# Patient Record
Sex: Female | Born: 1945 | Race: White | Hispanic: No | Marital: Married | Smoking: Never smoker
Health system: Southern US, Community
[De-identification: ages and names within clinical notes are randomized; demographics above are authoritative.]

## PROBLEM LIST (undated history)

## (undated) DIAGNOSIS — M1990 Unspecified osteoarthritis, unspecified site: Secondary | ICD-10-CM

## (undated) DIAGNOSIS — I499 Cardiac arrhythmia, unspecified: Secondary | ICD-10-CM

## (undated) DIAGNOSIS — R112 Nausea with vomiting, unspecified: Secondary | ICD-10-CM

## (undated) DIAGNOSIS — I471 Supraventricular tachycardia, unspecified: Secondary | ICD-10-CM

## (undated) DIAGNOSIS — M170 Bilateral primary osteoarthritis of knee: Secondary | ICD-10-CM

## (undated) DIAGNOSIS — N939 Abnormal uterine and vaginal bleeding, unspecified: Secondary | ICD-10-CM

## (undated) DIAGNOSIS — K900 Celiac disease: Secondary | ICD-10-CM

## (undated) DIAGNOSIS — T7840XA Allergy, unspecified, initial encounter: Secondary | ICD-10-CM

## (undated) DIAGNOSIS — I4891 Unspecified atrial fibrillation: Secondary | ICD-10-CM

## (undated) DIAGNOSIS — K52839 Microscopic colitis, unspecified: Secondary | ICD-10-CM

## (undated) DIAGNOSIS — H409 Unspecified glaucoma: Secondary | ICD-10-CM

## (undated) DIAGNOSIS — IMO0002 Reserved for concepts with insufficient information to code with codable children: Secondary | ICD-10-CM

## (undated) DIAGNOSIS — Z9889 Other specified postprocedural states: Secondary | ICD-10-CM

## (undated) DIAGNOSIS — G610 Guillain-Barre syndrome: Secondary | ICD-10-CM

## (undated) HISTORY — DX: Guillain-Barre syndrome: G61.0

## (undated) HISTORY — PX: APPENDECTOMY: SHX54

## (undated) HISTORY — DX: Celiac disease: K90.0

## (undated) HISTORY — DX: Microscopic colitis, unspecified: K52.839

## (undated) HISTORY — DX: Abnormal uterine and vaginal bleeding, unspecified: N93.9

## (undated) HISTORY — DX: Supraventricular tachycardia: I47.1

## (undated) HISTORY — DX: Unspecified osteoarthritis, unspecified site: M19.90

## (undated) HISTORY — DX: Unspecified glaucoma: H40.9

## (undated) HISTORY — PX: COLONOSCOPY: SHX174

## (undated) HISTORY — DX: Reserved for concepts with insufficient information to code with codable children: IMO0002

## (undated) HISTORY — DX: Unspecified atrial fibrillation: I48.91

## (undated) HISTORY — DX: Supraventricular tachycardia, unspecified: I47.10

## (undated) HISTORY — DX: Allergy, unspecified, initial encounter: T78.40XA

## (undated) HISTORY — DX: Bilateral primary osteoarthritis of knee: M17.0

## (undated) HISTORY — PX: TONSILLECTOMY AND ADENOIDECTOMY: SUR1326

---

## 1979-06-29 HISTORY — PX: TUBAL LIGATION: SHX77

## 1999-03-25 ENCOUNTER — Other Ambulatory Visit: Admission: RE | Admit: 1999-03-25 | Discharge: 1999-03-25 | Payer: Self-pay | Admitting: Obstetrics and Gynecology

## 1999-07-07 ENCOUNTER — Other Ambulatory Visit: Admission: RE | Admit: 1999-07-07 | Discharge: 1999-07-07 | Payer: Self-pay | Admitting: Obstetrics and Gynecology

## 2001-02-20 ENCOUNTER — Other Ambulatory Visit: Admission: RE | Admit: 2001-02-20 | Discharge: 2001-02-20 | Payer: Self-pay | Admitting: Obstetrics and Gynecology

## 2001-04-25 ENCOUNTER — Encounter: Admission: RE | Admit: 2001-04-25 | Discharge: 2001-04-25 | Payer: Self-pay | Admitting: Internal Medicine

## 2001-04-25 ENCOUNTER — Encounter: Payer: Self-pay | Admitting: Obstetrics and Gynecology

## 2002-01-30 ENCOUNTER — Other Ambulatory Visit: Admission: RE | Admit: 2002-01-30 | Discharge: 2002-01-30 | Payer: Self-pay | Admitting: Obstetrics and Gynecology

## 2003-02-14 ENCOUNTER — Other Ambulatory Visit: Admission: RE | Admit: 2003-02-14 | Discharge: 2003-02-14 | Payer: Self-pay | Admitting: Obstetrics and Gynecology

## 2003-11-21 ENCOUNTER — Observation Stay (HOSPITAL_COMMUNITY): Admission: EM | Admit: 2003-11-21 | Discharge: 2003-11-22 | Payer: Self-pay | Admitting: Emergency Medicine

## 2004-02-25 ENCOUNTER — Other Ambulatory Visit: Admission: RE | Admit: 2004-02-25 | Discharge: 2004-02-25 | Payer: Self-pay | Admitting: Obstetrics and Gynecology

## 2005-02-03 ENCOUNTER — Other Ambulatory Visit: Admission: RE | Admit: 2005-02-03 | Discharge: 2005-02-03 | Payer: Self-pay | Admitting: Obstetrics and Gynecology

## 2005-04-20 ENCOUNTER — Ambulatory Visit (HOSPITAL_COMMUNITY): Admission: RE | Admit: 2005-04-20 | Discharge: 2005-04-21 | Payer: Self-pay | Admitting: Obstetrics and Gynecology

## 2005-04-20 HISTORY — PX: VAGINAL HYSTERECTOMY: SUR661

## 2005-09-14 ENCOUNTER — Emergency Department (HOSPITAL_COMMUNITY): Admission: EM | Admit: 2005-09-14 | Discharge: 2005-09-14 | Payer: Self-pay | Admitting: Emergency Medicine

## 2006-02-26 ENCOUNTER — Encounter: Admission: RE | Admit: 2006-02-26 | Discharge: 2006-02-26 | Payer: Self-pay

## 2013-05-09 ENCOUNTER — Encounter: Payer: Self-pay | Admitting: Internal Medicine

## 2013-08-02 ENCOUNTER — Encounter: Payer: Self-pay | Admitting: Certified Nurse Midwife

## 2013-08-06 ENCOUNTER — Ambulatory Visit (INDEPENDENT_AMBULATORY_CARE_PROVIDER_SITE_OTHER): Payer: Medicare Other | Admitting: Certified Nurse Midwife

## 2013-08-06 ENCOUNTER — Encounter: Payer: Self-pay | Admitting: Certified Nurse Midwife

## 2013-08-06 VITALS — BP 98/62 | HR 64 | Resp 16 | Ht 64.5 in | Wt 145.0 lb

## 2013-08-06 DIAGNOSIS — Z01419 Encounter for gynecological examination (general) (routine) without abnormal findings: Secondary | ICD-10-CM

## 2013-08-06 DIAGNOSIS — N951 Menopausal and female climacteric states: Secondary | ICD-10-CM

## 2013-08-06 MED ORDER — ESTRADIOL 0.025 MG/24HR TD PTWK: 0.0250 mg | MEDICATED_PATCH | TRANSDERMAL | Status: DC

## 2013-08-06 NOTE — Patient Instructions (Signed)
EXERCISE AND DIET:  We recommended that you start or continue a regular exercise program for good health. Regular exercise means any activity that makes your heart beat faster and makes you sweat.  We recommend exercising at least 30 minutes per day at least 3 days a week, preferably 4 or 5.  We also recommend a diet low in fat and sugar.  Inactivity, poor dietary choices and obesity can cause diabetes, heart attack, stroke, and kidney damage, among others.    ALCOHOL AND SMOKING:  Women should limit their alcohol intake to no more than 7 drinks/beers/glasses of wine (combined, not each!) per week. Moderation of alcohol intake to this level decreases your risk of breast cancer and liver damage. And of course, no recreational drugs are part of a healthy lifestyle.  And absolutely no smoking or even second hand smoke. Most people know smoking can cause heart and lung diseases, but did you know it also contributes to weakening of your bones? Aging of your skin?  Yellowing of your teeth and nails?  CALCIUM AND VITAMIN D:  Adequate intake of calcium and Vitamin D are recommended.  The recommendations for exact amounts of these supplements seem to change often, but generally speaking 600 mg of calcium (either carbonate or citrate) and 800 units of Vitamin D per day seems prudent. Certain women may benefit from higher intake of Vitamin D.  If you are among these women, your doctor will have told you during your visit.    PAP SMEARS:  Pap smears, to check for cervical cancer or precancers,  have traditionally been done yearly, although recent scientific advances have shown that most women can have pap smears less often.  However, every woman still should have a physical exam from her gynecologist every year. It will include a breast check, inspection of the vulva and vagina to check for abnormal growths or skin changes, a visual exam of the cervix, and then an exam to evaluate the size and shape of the uterus and  ovaries.  And after 69 years of age, a rectal exam is indicated to check for rectal cancers. We will also provide age appropriate advice regarding health maintenance, like when you should have certain vaccines, screening for sexually transmitted diseases, bone density testing, colonoscopy, mammograms, etc.   MAMMOGRAMS:  All women over 40 years old should have a yearly mammogram. Many facilities now offer a "3D" mammogram, which may cost around $50 extra out of pocket. If possible,  we recommend you accept the option to have the 3D mammogram performed.  It both reduces the number of women who will be called back for extra views which then turn out to be normal, and it is better than the routine mammogram at detecting truly abnormal areas.    COLONOSCOPY:  Colonoscopy to screen for colon cancer is recommended for all women at age 50.  We know, you hate the idea of the prep.  We agree, BUT, having colon cancer and not knowing it is worse!!  Colon cancer so often starts as a polyp that can be seen and removed at colonscopy, which can quite literally save your life!  And if your first colonoscopy is normal and you have no family history of colon cancer, most women don't have to have it again for 10 years.  Once every ten years, you can do something that may end up saving your life, right?  We will be happy to help you get it scheduled when you are ready.    Be sure to check your insurance coverage so you understand how much it will cost.  It may be covered as a preventative service at no cost, but you should check your particular policy.     Hormone Therapy At menopause, your body begins making less estrogen and progesterone hormones. This causes the body to stop having menstrual periods. This is because estrogen and progesterone hormones control your periods and menstrual cycle. A lack of estrogen may cause symptoms such as:  Hot flushes (or hot flashes).  Vaginal dryness.  Dry skin.  Loss of sex  drive.  Risk of bone loss (osteoporosis). When this happens, you may choose to take hormone therapy to get back the estrogen lost during menopause. When the hormone estrogen is given alone, it is usually referred to as ET (Estrogen Therapy). When the hormone progestin is combined with estrogen, it is generally called HT (Hormone Therapy). This was formerly known as hormone replacement therapy (HRT). Your caregiver can help you make a decision on what will be best for you. The decision to use HT seems to change often as new studies are done. Many studies do not agree on the benefits of hormone replacement therapy. LIKELY BENEFITS OF HT INCLUDE PROTECTION FROM:  Hot Flushes (also called hot flashes) - A hot flush is a sudden feeling of heat that spreads over the face and body. The skin may redden like a blush. It is connected with sweats and sleep disturbance. Women going through menopause may have hot flushes a few times a month or several times per day depending on the woman.  Osteoporosis (bone loss)- Estrogen helps guard against bone loss. After menopause, a woman's bones slowly lose calcium and become weak and brittle. As a result, bones are more likely to break. The hip, wrist, and spine are affected most often. Hormone therapy can help slow bone loss after menopause. Weight bearing exercise and taking calcium with vitamin D also can help prevent bone loss. There are also medications that your caregiver can prescribe that can help prevent osteoporosis.  Vaginal Dryness - Loss of estrogen causes changes in the vagina. Its lining may become thin and dry. These changes can cause pain and bleeding during sexual intercourse. Dryness can also lead to infections. This can cause burning and itching. (Vaginal estrogen treatment can help relieve pain, itching, and dryness.)  Urinary Tract Infections are more common after menopause because of lack of estrogen. Some women also develop urinary incontinence  because of low estrogen levels in the vagina and bladder.  Possible other benefits of estrogen include a positive effect on mood and short-term memory in women. RISKS AND COMPLICATIONS  Using estrogen alone without progesterone causes the lining of the uterus to grow. This increases the risk of lining of the uterus (endometrial) cancer. Your caregiver should give another hormone called progestin if you have a uterus.  Women who take combined (estrogen and progestin) HT appear to have an increased risk of breast cancer. The risk appears to be small, but increases throughout the time that HT is taken.  Combined therapy also makes the breast tissue slightly denser which makes it harder to read mammograms (breast X-rays).  Combined, estrogen and progesterone therapy can be taken together every day, in which case there may be spotting of blood. HT therapy can be taken cyclically in which case you will have menstrual periods. Cyclically means HT is taken for a set amount of days, then not taken, then this process is repeated.  HT  increase the risk of stroke, heart attack, breast cancer and forming blood clots in your leg.  Transdermal estrogen (estrogen that is absorbed through the skin with a patch or a cream) may have more positive results with:  Cholesterol.  Blood pressure.  Blood clots. Having the following conditions may indicate you should not have HT:  Endometrial cancer.  Liver disease.  Breast cancer.  Heart disease.  History of blood clots.  Stroke. TREATMENT   If you choose to take HT and have a uterus, usually estrogen and progestin are prescribed.  Your caregiver will help you decide the best way to take the medications.  Possible ways to take estrogen include:  Pills.  Patches.  Gels.  Sprays.  Vaginal estrogen cream, rings and tablets.  It is best to take the lowest dose possible that will help your symptoms and take them for the shortest period of  time that you can.  Hormone therapy can help relieve some of the problems (symptoms) that affect women at menopause. Before making a decision about HT, talk to your caregiver about what is best for you. Be well informed and comfortable with your decisions. HOME CARE INSTRUCTIONS   Follow your caregivers advice when taking the medications.  A Pap test is done to screen for cervical cancer.  The first Pap test should be done at age 21.  Between ages 21 and 29, Pap tests are repeated every 2 years.  Beginning at age 30, you are advised to have a Pap test every 3 years as long as your past 3 Pap tests have been normal.  Some women have medical problems that increase the chance of getting cervical cancer. Talk to your caregiver about these problems. It is especially important to talk to your caregiver if a new problem develops soon after your last Pap test. In these cases, your caregiver may recommend more frequent screening and Pap tests.  The above recommendations are the same for women who have or have not gotten the vaccine for HPV (Human Papillomavirus).  If you had a hysterectomy for a problem that was not a cancer or a condition that could lead to cancer, then you no longer need Pap tests. However, even if you no longer need a Pap test, a regular exam is a good idea to make sure no other problems are starting.   If you are between ages 65 and 70, and you have had normal Pap tests going back 10 years, you no longer need Pap tests. However, even if you no longer need a Pap test, a regular exam is a good idea to make sure no other problems are starting.   If you have had past treatment for cervical cancer or a condition that could lead to cancer, you need Pap tests and screening for cancer for at least 20 years after your treatment.  If Pap tests have been discontinued, risk factors (such as a new sexual partner) need to be re-assessed to determine if screening should be  resumed.  Some women may need screenings more often if they are at high risk for cervical cancer.  Get mammograms done as per the advice of your caregiver. SEEK IMMEDIATE MEDICAL CARE IF:  You develop abnormal vaginal bleeding.  You have pain or swelling in your legs, shortness of breath, or chest pain.  You develop dizziness or headaches.  You have lumps or changes in your breasts or armpits.  You have slurred speech.  You develop weakness or numbness of   of your arms or legs.  You have pain, burning, or bleeding when urinating.  You develop abdominal pain. Document Released: 03/13/2003 Document Revised: 09/06/2011 Document Reviewed: 07/01/2010 [facility] Patient Information 2014 [place], [place].

## 2013-08-06 NOTE — Progress Notes (Signed)
68 y.o. [id number] Married Caucasian Fe here for annual exam.  Menopausal ERT working well. No vaginal bleeding or vaginal dryness. Sees PCP yearly for aex and labs and medication management, no changes in dose. Arthritis continues to be tolerable with medication. Had elbow fracture right in 2014. No change in BMD per patient. PCP manages. No other health issues today.  Patient's last menstrual period was 04/20/2005.          Sexually active: yes  The current method of family planning is status post hysterectomy.    Exercising: yes  walking Smoker:  no  Health Maintenance: Pap:  02-03-05 neg MMG:  08-03-13 neg Colonoscopy: 2008 10 years BMD:   2014 normal per patient TDaP:  2012 Labs: none Self breast exam: done monthly     Past Medical History  Diagnosis Date  . Abnormal uterine bleeding     on HRT  . SVT (supraventricular tachycardia)   . Arthritis   . DDD (degenerative disc disease)     Past Surgical History  Procedure Laterality Date  . Tonsillectomy and adenoidectomy    . Tubal ligation  1981  . Vaginal hysterectomy  04/20/05    prolapse, adenomyosis    Current Outpatient Prescriptions  Medication Sig Dispense Refill  . aspirin 81 MG tablet Take 81 mg by mouth daily.      . Calcium Carbonate Antacid (TUMS PO) Take by mouth as needed.      Marland Kitchen estradiol (CLIMARA - DOSED IN MG/24 HR) 0.025 mg/24hr patch Place 0.025 mg onto the skin once a week.      Marland Kitchen glucosamine-chondroitin 500-400 MG tablet Take 1 tablet by mouth 3 (three) times daily.      . IBUPROFEN PO Take by mouth daily.      . naproxen sodium (ANAPROX) 220 MG tablet Take 220 mg by mouth as needed.      . Omega-3 Fatty Acids (FISH OIL PO) Take by mouth as needed.      . psyllium (METAMUCIL) 58.6 % packet Take 1 packet by mouth daily.       No current facility-administered medications for this visit.    Family History  Problem Relation Age of Onset  . Osteoporosis Mother   . Cancer Father     father  . Heart  disease Brother     ROS:  Pertinent items are noted in HPI.  Otherwise, a comprehensive ROS was negative.  Exam:   Ht 5' 4.5" (1.638 m)  Wt 145 lb (65.772 kg)  BMI 24.51 kg/m2  LMP 04/20/2005 Height: 5' 4.5" (163.8 cm)  Ht Readings from Last 3 Encounters:  08/06/13 5' 4.5" (1.638 m)    General appearance: alert, cooperative and appears stated age Head: Normocephalic, without obvious abnormality, atraumatic Neck: no adenopathy, supple, symmetrical, trachea midline and thyroid normal to inspection and palpation Lungs: clear to auscultation bilaterally Breasts: normal appearance, no masses or tenderness, No nipple retraction or dimpling, No nipple discharge or bleeding, No axillary or supraclavicular adenopathy Heart: regular rate and rhythm Abdomen: soft, non-tender; no masses,  no organomegaly Extremities: extremities normal, atraumatic, no cyanosis or edema Skin: Skin color, texture, turgor normal. No rashes or lesions Lymph nodes: Cervical, supraclavicular, and axillary nodes normal. No abnormal inguinal nodes palpated Neurologic: Grossly normal   Pelvic: External genitalia:  no lesions              Urethra:  normal appearing urethra with no masses, tenderness or lesions  Bartholin's and Skene's: normal                 Vagina: normal appearing vagina with normal color and discharge, no lesions              Cervix: not present              Pap taken: no Bimanual Exam:  Uterus:  uterus absent              Adnexa: normal adnexa and no mass, fullness, tenderness               Rectovaginal: Confirms               Anus:  normal sphincter tone, no lesions  A:  Well Woman with normal exam  Menopausal on ERT desires continuance s/p TVH due to prolapse, ovaries retained  Rheumatoid Arthritis  Recent elbow fracture right  Hypertension stable on medication with PCP management  P:   Reviewed health and wellness pertinent to exam  Discussed risks/benefits of long term  estrogen use, WHI information. Discussed recent fracture and history of severe osteoporosis in MGM, and patient's history of arthritis. Patient and provider agree together that her benefits out weigh risks. She is well controlled with hypertension and has regular aex with PCP and see MD. Regarding arthritis. Will continue Climara and re evaluate in one year. Patient to advise if any health changes occur. Rx Climara: see order  Pap smear as per guidelines   Mammogram yearly pap smear not taken today  counseled on breast self exam, mammography screening, menopause, adequate intake of calcium and vitamin D, diet and exercise, Kegel's exercises  return annually or prn  An After Visit Summary was printed and given to the patient.

## 2013-08-10 NOTE — Progress Notes (Signed)
Reviewed personally.  M. Suzanne Noga Fogg, MD.  

## 2013-10-26 HISTORY — PX: FOOT FRACTURE SURGERY: SHX645

## 2013-11-30 ENCOUNTER — Encounter (HOSPITAL_COMMUNITY): Payer: Self-pay | Admitting: Emergency Medicine

## 2013-11-30 ENCOUNTER — Emergency Department (HOSPITAL_COMMUNITY): Payer: Medicare Other

## 2013-11-30 ENCOUNTER — Inpatient Hospital Stay (HOSPITAL_COMMUNITY)
Admission: EM | Admit: 2013-11-30 | Discharge: 2013-12-02 | DRG: 310 | Disposition: A | Discharge status: Home / Self Care | Payer: Medicare Other | Attending: Internal Medicine | Admitting: Internal Medicine

## 2013-11-30 DIAGNOSIS — I480 Paroxysmal atrial fibrillation: Secondary | ICD-10-CM | POA: Diagnosis present

## 2013-11-30 DIAGNOSIS — I4891 Unspecified atrial fibrillation: Principal | ICD-10-CM

## 2013-11-30 DIAGNOSIS — I4892 Unspecified atrial flutter: Secondary | ICD-10-CM | POA: Diagnosis present

## 2013-11-30 DIAGNOSIS — I959 Hypotension, unspecified: Secondary | ICD-10-CM

## 2013-11-30 DIAGNOSIS — Z79899 Other long term (current) drug therapy: Secondary | ICD-10-CM

## 2013-11-30 DIAGNOSIS — M1990 Unspecified osteoarthritis, unspecified site: Secondary | ICD-10-CM

## 2013-11-30 LAB — CBC
HEMATOCRIT: 44.5 % (ref 36.0–46.0)
Hemoglobin: 14.9 g/dL (ref 12.0–15.0)
MCH: 31 pg (ref 26.0–34.0)
MCHC: 33.5 g/dL (ref 30.0–36.0)
MCV: 92.7 fL (ref 78.0–100.0)
PLATELETS: 321 10*3/uL (ref 150–400)
RBC: 4.8 MIL/uL (ref 3.87–5.11)
RDW: 14 % (ref 11.5–15.5)
WBC: 10 10*3/uL (ref 4.0–10.5)

## 2013-11-30 LAB — BASIC METABOLIC PANEL
BUN: 18 mg/dL (ref 6–23)
CALCIUM: 9.5 mg/dL (ref 8.4–10.5)
CO2: 26 mEq/L (ref 19–32)
CREATININE: 0.55 mg/dL (ref 0.50–1.10)
Chloride: 101 mEq/L (ref 96–112)
Glucose, Bld: 99 mg/dL (ref 70–99)
Potassium: 4.8 mEq/L (ref 3.7–5.3)
Sodium: 139 mEq/L (ref 137–147)

## 2013-11-30 LAB — I-STAT TROPONIN, ED: TROPONIN I, POC: 0 ng/mL (ref 0.00–0.08)

## 2013-11-30 MED ORDER — DILTIAZEM LOAD VIA INFUSION
10.0000 mg | Freq: Once | INTRAVENOUS | Status: AC
  Administered 2013-11-30: 10 mg via INTRAVENOUS
  Filled 2013-11-30: qty 10

## 2013-11-30 MED ORDER — DILTIAZEM HCL 100 MG IV SOLR
5.0000 mg/h | INTRAVENOUS | Status: DC
  Administered 2013-11-30: 5 mg/h via INTRAVENOUS

## 2013-11-30 MED ORDER — SODIUM CHLORIDE 0.9 % IV BOLUS (SEPSIS)
500.0000 mL | Freq: Once | INTRAVENOUS | Status: AC
  Administered 2013-11-30: 500 mL via INTRAVENOUS

## 2013-11-30 NOTE — ED Notes (Signed)
Pt reports a twinge of substernal chest pain. States that the pain comes and goes quickly.

## 2013-11-30 NOTE — H&P (Signed)
PCP:  Thressa Sheller, MD    Chief Complaint:  palpitations  HPI: Angela Morales is a 68 y.o. female   has a past medical history of Abnormal uterine bleeding; SVT (supraventricular tachycardia); Arthritis; and DDD (degenerative disc disease).   Presented with  Sensation of palpitations. She has been on crutches for the past 5 weeks after Left foot fracture. Patient have had intermittent short episodes of chest pain. Thursday night she had some palpitations. She have not been eating well and have had some nausea. Every time she tries to get up she feels light headed. She presented to ER and was found to be in tachycardiac rhythm thought to be possibly A. flutter she was given diltiazem and her HR went down from 130's to 110 repeat ECG showed A.fib with RVR. Her blood pressure went down as well to 80/40 ths has resolved once diltiazem was stopped.  Currently she is chest pain free but gets occasional twinge in the center of her chest. Not worse with deep breaths or movement.  Currently on the monitor patient back in sinus rhythmwith HR in 70-80's  Hospitalist was called for admission for new onset a.fib.  Review of Systems:    Pertinent positives include: chest pain, nausea   Constitutional:  No weight loss, night sweats, Fevers, chills, fatigue, weight loss  HEENT:  No headaches, Difficulty swallowing,Tooth/dental problems,Sore throat,  No sneezing, itching, ear ache, nasal congestion, post nasal drip,  Cardio-vascular:  No  Orthopnea, PND, anasarca, dizziness, palpitations.no Bilateral lower extremity swelling  GI:  No heartburn, indigestion, abdominal pain, vomiting, diarrhea, change in bowel habits, loss of appetite, melena, blood in stool, hematemesis Resp:  no shortness of breath at rest. No dyspnea on exertion, No excess mucus, no productive cough, No non-productive cough, No coughing up of blood.No change in color of mucus.No wheezing. Skin:  no rash or lesions. No  jaundice GU:  no dysuria, change in color of urine, no urgency or frequency. No straining to urinate.  No flank pain.  Musculoskeletal:  No joint pain or no joint swelling. No decreased range of motion. No back pain.  Psych:  No change in mood or affect. No depression or anxiety. No memory loss.  Neuro: no localizing neurological complaints, no tingling, no weakness, no double vision, no gait abnormality, no slurred speech, no confusion  Otherwise ROS are negative except for above, 10 systems were reviewed  Past Medical History: Past Medical History  Diagnosis Date  . Abnormal uterine bleeding     on HRT  . SVT (supraventricular tachycardia)   . Arthritis   . DDD (degenerative disc disease)    Past Surgical History  Procedure Laterality Date  . Tonsillectomy and adenoidectomy    . Tubal ligation  1981  . Vaginal hysterectomy  04/20/05    prolapse, adenomyosis  . Appendectomy       Medications: Prior to Admission medications   Medication Sig Start Date End Date Taking? Authorizing Provider  Cholecalciferol (VITAMIN D PO) Take 2,000 Int'l Units by mouth daily.    Yes Historical Provider, MD  estradiol (CLIMARA - DOSED IN MG/24 HR) 0.025 mg/24hr patch Place 0.025 mg onto the skin once a week. Mondays 08/06/13  Yes Regina Eck, CNM  fluticasone (FLONASE) 50 MCG/ACT nasal spray Place 1 spray into both nostrils daily as needed for allergies.  08/03/13  Yes Historical Provider, MD  Glucosamine HCl (GLUCOSAMINE PO) Take 1 tablet by mouth daily.    Yes Historical Provider, MD  LOTEMAX 0.5 % ophthalmic suspension Place 1 drop into both eyes daily.  08/03/13  Yes Historical Provider, MD  metoprolol tartrate (LOPRESSOR) 25 MG tablet Take 12.5 mg by mouth 2 (two) times daily. 1/2 bid   Yes Historical Provider, MD  naproxen sodium (ANAPROX) 220 MG tablet Take 220 mg by mouth daily as needed. For pain   Yes Historical Provider, MD  Omega-3 Fatty Acids (FISH OIL PO) Take 1 capsule by mouth  daily.    Yes Historical Provider, MD  psyllium (METAMUCIL) 58.6 % packet Take 1 packet by mouth daily.   Yes Historical Provider, MD    Allergies:   Allergies  Allergen Reactions  . Cefdinir Rash    Social History:  Ambulatory  independently   Lives at home   With family     reports that she has never smoked. She does not have any smokeless tobacco history on file. She reports that she drinks about 2.4 - 3.6 ounces of alcohol per week. She reports that she does not use illicit drugs.    Family History: family history includes Breast cancer in her cousin; Cancer in her father; Heart disease in her brother; Osteoporosis in her mother; Stroke in her mother.    Physical Exam: Patient Vitals for the past 24 hrs:  BP Temp Temp src Pulse Resp SpO2 Height Weight  11/30/13 2251 101/83 mmHg - - - 15 99 % - -  11/30/13 2217 85/70 mmHg - - - 19 98 % - -  11/30/13 2135 98/70 mmHg - - 118 14 99 % - -  11/30/13 2100 108/75 mmHg - - 130 16 100 % - -  11/30/13 2040 123/90 mmHg - - 135 16 99 % - -  11/30/13 2016 118/84 mmHg - - - 9 100 % - -  11/30/13 1955 129/75 mmHg - - 136 16 100 % - -  11/30/13 1943 - - - - - - 5' 5"  (1.651 m) 64.411 kg (142 lb)  11/30/13 1942 110/81 mmHg 98 F (36.7 C) Oral 135 17 100 % - -    1. General:  in No Acute distress 2. Psychological: Alert and   Oriented 3. Head/ENT:    Dry Mucous Membranes                          Head Non traumatic, neck supple                          Normal   Dentition 4. SKIN:   decreased Skin turgor,  Skin clean Dry and intact no rash 5. Heart: Regular rate and rhythm no Murmur, Rub or gallop 6. Lungs: Clear to auscultation bilaterally, no wheezes or crackles   7. Abdomen: Soft, non-tender, Non distended 8. Lower extremities: no clubbing, cyanosis, or edema, Left ankle appears slightly larger than right patient states that has been present  ever since her surgery and recent sprain.  9. Neurologically Grossly intact, moving all 4  extremities equally 10. MSK: Normal range of motion  body mass index is 23.63 kg/(m^2).   Labs on Admission:   Recent Labs  11/30/13 1955  NA 139  K 4.8  CL 101  CO2 26  GLUCOSE 99  BUN 18  CREATININE 0.55  CALCIUM 9.5   No results found for this basename: AST, ALT, ALKPHOS, BILITOT, PROT, ALBUMIN,  in the last 72 hours No results found for this basename: LIPASE,  AMYLASE,  in the last 72 hours  Recent Labs  11/30/13 1955  WBC 10.0  HGB 14.9  HCT 44.5  MCV 92.7  PLT 321   No results found for this basename: CKTOTAL, CKMB, CKMBINDEX, TROPONINI,  in the last 72 hours No results found for this basename: TSH, T4TOTAL, FREET3, T3FREE, THYROIDAB,  in the last 72 hours No results found for this basename: VITAMINB12, FOLATE, FERRITIN, TIBC, IRON, RETICCTPCT,  in the last 72 hours No results found for this basename: HGBA1C    Estimated Creatinine Clearance: 60.6 ml/min (by C-G formula based on Cr of 0.55). ABG No results found for this basename: phart, pco2, po2, hco3, tco2, acidbasedef, o2sat     No results found for this basename: DDIMER     Other results:  I have pearsonaly reviewed this: ECG REPORT  Rate 136  Rhythm: a. flutter ST&T Change: no ischemic changes  BNP (last 3 results) No results found for this basename: PROBNP,  in the last 8760 hours  Filed Weights   11/30/13 1943  Weight: 64.411 kg (142 lb)    Cultures: No results found for this basename: sdes, specrequest, cult, reptstatus     Radiological Exams on Admission: Dg Chest 2 View  11/30/2013   CLINICAL DATA:  Chest pain.  Palpitations.  Lightheadedness.  EXAM: CHEST  2 VIEW  COMPARISON:  Chest x-ray 04/19/2005.  FINDINGS: Lung volumes are normal. No consolidative airspace disease. No pleural effusions. No pneumothorax. No pulmonary nodule or mass noted. Pulmonary vasculature and the cardiomediastinal silhouette are within normal limits.  IMPRESSION: No radiographic evidence of acute  cardiopulmonary disease.   Electronically Signed   By: Vinnie Langton M.D.   On: 11/30/2013 20:14    Chart has been reviewed  Assessment/Plan  68 year old female with remote history of SVT pain on the Toprol presents with episode of A. fib with RVR currently converted back to sinus rhythm with transient hypotension in the setting of diltiazem drip.   Present on Admission:   . Atrial fibrillation with RVR - currently converted back to sinus. Continue metoprolol obtain echo gram and TSH. CHADS2 score 0, we'll start with aspirin 81 mg a day. . Hypotension - transient in a setting of diltiazem drip currently have stopped. Check orthostatics patient appears to be slightly hemoconcentrated. Obtain d-dimer Chest pain atypical transient but will cycle cardiac enzymes  Prophylaxis:  Lovenox CODE STATUS:  FULL CODE   Other plan as per orders.  I have spent a total of 55 min on this admission  Jakoby Melendrez 11/30/2013, 11:54 PM

## 2013-11-30 NOTE — ED Provider Notes (Signed)
CSN: [id number]     Arrival date & time 11/30/13  1925 History   First MD Initiated Contact with Patient 11/30/13 2011     Chief Complaint  Patient presents with  . 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 She was first diagnosed with SVT 8 years ago, and states that at that time her heart rate was in the 250s. It has been well-controlled since that time on her metoprolol. Last night she be in a have palpitations and noted that her heart was beating quickly. This subsided this morning, but returned again this afternoon. She's been having a few seconds of chest pain at a time for the last 4 days. The chest pain occurs about 4-5 times per hour. It is not worse with exertion. It is on the left side of her mid chest. She has also experienced some lightheadedness. She denies having any lower extremity edema. Has felt nauseated but has not vomited.  Of note she did have foot surgery 5 weeks ago after having a fractured fifth metatarsal. She's been ambulating on crutches after that surgery.  Past Medical History  Diagnosis Date  . Abnormal uterine bleeding     on HRT  . SVT (supraventricular tachycardia)   . Arthritis   . DDD (degenerative disc disease)    Past Surgical History  Procedure Laterality Date  . Tonsillectomy and adenoidectomy    . Tubal ligation  1981  . Vaginal hysterectomy  04/20/05    prolapse, adenomyosis  . Appendectomy     Family History  Problem Relation Age of Onset  . Osteoporosis Mother   . Stroke Mother     mini  . Cancer Father     prostate  . Heart disease Brother   . Breast cancer Cousin     1st cousin   History  Substance Use Topics  . Smoking status: Never Smoker   . Smokeless tobacco: Not on file  .  Alcohol Use: 2.4 - 3.6 oz/week    4-6 Glasses of wine per week   OB History   Grav Para Term Preterm Abortions TAB SAB Ect Mult Living   3 3 3       2      Review of Systems  Respiratory: Positive for shortness of breath.   Cardiovascular: Positive for chest pain and palpitations. Negative for leg swelling.  Neurological: Positive for light-headedness.  All other systems reviewed and are negative.     Allergies  Cefdinir  Home Medications   Prior to Admission medications   Medication Sig Start Date End Date Taking? Authorizing Provider  Calcium Carbonate Antacid (TUMS PO) Take by mouth as needed.    Historical Provider, MD  Cholecalciferol (VITAMIN D PO) Take 2,000 Int'l Units by mouth daily.     Historical Provider, MD  estradiol (CLIMARA - DOSED IN MG/24 HR) 0.025 mg/24hr patch Place 1 patch (0.025 mg total) onto the skin once a week. 08/06/13   Regina Eck, CNM  fluticasone (FLONASE) 50 MCG/ACT nasal spray as needed. 08/03/13   Historical Provider, MD  Glucosamine HCl (GLUCOSAMINE PO) Take by mouth daily.    Historical Provider, MD  LOTEMAX 0.5 % ophthalmic suspension daily. 08/03/13   Historical Provider, MD  metoprolol tartrate (LOPRESSOR) 25 MG tablet Take 25 mg by mouth. 1/2 bid  Historical Provider, MD  naproxen sodium (ANAPROX) 220 MG tablet Take 220 mg by mouth as needed.    Historical Provider, MD  Omega-3 Fatty Acids (FISH OIL PO) Take by mouth daily.     Historical Provider, MD  psyllium (METAMUCIL) 58.6 % packet Take 1 packet by mouth daily.    Historical Provider, MD   BP 123/90  Pulse 135  Temp(Src) 98 F (36.7 C) (Oral)  Resp 16  Ht 5' 5"  (1.651 m)  Wt 142 lb (64.411 kg)  BMI 23.63 kg/m2  SpO2 99%  LMP 04/20/2005 Physical Exam  Constitutional: She is oriented to person, place, and time. She appears well-developed and well-nourished. No distress.  HENT:  Head: Normocephalic and atraumatic.  Mouth/Throat: Oropharynx is clear and moist.  Eyes: Pupils  are equal, round, and reactive to light.  Cardiovascular:  No murmur heard. Tachycardic regular rhythm  Pulmonary/Chest: Effort normal and breath sounds normal.  Abdominal: Soft. She exhibits no distension. There is no tenderness. There is no rebound and no guarding.  Musculoskeletal: She exhibits no edema.  Neurological: She is alert and oriented to person, place, and time.  Skin: Skin is warm and dry. She is not diaphoretic.  Psychiatric: She has a normal mood and affect. Her behavior is normal.     ED Course  Procedures (including critical care time) Labs Review Labs Reviewed  CBC  BASIC METABOLIC PANEL  D-DIMER, QUANTITATIVE  Randolm Idol, ED    Imaging Review Dg Chest 2 View  11/30/2013   CLINICAL DATA:  Chest pain.  Palpitations.  Lightheadedness.  EXAM: CHEST  2 VIEW  COMPARISON:  Chest x-ray 04/19/2005.  FINDINGS: Lung volumes are normal. No consolidative airspace disease. No pleural effusions. No pneumothorax. No pulmonary nodule or mass noted. Pulmonary vasculature and the cardiomediastinal silhouette are within normal limits.  IMPRESSION: No radiographic evidence of acute cardiopulmonary disease.   Electronically Signed   By: Vinnie Langton M.D.   On: 11/30/2013 20:14     EKG Interpretation None      MDM   Final diagnoses:  Atrial fibrillation    68 year old female with a history of SVT controlled on metoprolol twice daily, who presents with palpitations and tachycardia. Currently her to telemetry monitor is showing a narrow complex regular tachycardia without visible P waves, suggestive of junctional rhythm. EKG difficult to interpret due to electrical noise.  Will start Cardizem bolus and infusion and plan to repeat EKG once her rate slowed. Will also check d-dimer to rule out PE in setting of orthopedic surgery 5 weeks ago. Her chest pain is nonspecific, and initial troponin is negative. Will repeat her troponin in 3 hours.  Update: Heart rate has slowed  to 118, but now she is hypotensive with a pressure of 85/70. Repeat EKG shows atrial fibrillation, with narrow complex tachycardia irregular rhythm. No P waves seen. Will stop Cardizem drip due to hypotension and bolus 500 cc, which will hopefully also help her heart rate.  Update: After just 250 cc of her bolus, patient's heart rate is down into the 70s, and her blood pressure has improved into the low 315Q systolic. She remains in atrial fibrillation.  Patient will require admission to the hospital for observation and continued control of her heart rate. She will likely need anticoagulation with a CHADS2Vasc score of 2 (one point each for age and gender). Will consult hospitalist for admission.  Chrisandra Netters, MD Family Medicine PGY-2   Leeanne Rio, MD 11/30/13 [phone]

## 2013-11-30 NOTE — ED Notes (Signed)
Pt is in a-fib. Pt denies hx of a-fib. This RN performed another EKG, and gave it to Fonnie Jarvis, MD.

## 2013-11-30 NOTE — ED Provider Notes (Signed)
I saw and evaluated the patient, reviewed the resident's note and I agree with the findings and plan.   Muse not working; ECG: Atrial fibrillation, ventricular rate 119, normal axis, artifact present, no definite acute ischemic changes noted, compared with previous ECG atrial fibrillation now present 2 days intermittent palpitations and sudden intermittent brief sharp nonpleuritic nonexertional CP  2 days intermittent palpitations also with brief intermittent CP spells lasting seconds each, nonexertional and nonpleuritic, had a narrow complex tachycardia regular rhythm initially in the ED with rates in the 130s and changed to an irregular rhythm atrial fibrillation during ED stay  Hurman Horn, MD 12/02/13 1423

## 2013-11-30 NOTE — ED Notes (Signed)
Patient states she has been having fluttering sensation in her chest for most of the day, with intermittent chest pain during the day.  Patient states she has been having some lightheadedness and nausea with the fluttering and pain.  She denies any shortness of breath.  Patient states she does take Metoprolol on a regular basis but has not taken any extra today.

## 2013-12-01 DIAGNOSIS — I4891 Unspecified atrial fibrillation: Secondary | ICD-10-CM | POA: Diagnosis present

## 2013-12-01 DIAGNOSIS — I059 Rheumatic mitral valve disease, unspecified: Secondary | ICD-10-CM

## 2013-12-01 LAB — URINE MICROSCOPIC-ADD ON

## 2013-12-01 LAB — COMPREHENSIVE METABOLIC PANEL
ALT: 16 U/L (ref 0–35)
AST: 22 U/L (ref 0–37)
Albumin: 3.1 g/dL — ABNORMAL LOW (ref 3.5–5.2)
Alkaline Phosphatase: 68 U/L (ref 39–117)
BILIRUBIN TOTAL: 0.3 mg/dL (ref 0.3–1.2)
BUN: 15 mg/dL (ref 6–23)
CALCIUM: 8.6 mg/dL (ref 8.4–10.5)
CHLORIDE: 105 meq/L (ref 96–112)
CO2: 25 meq/L (ref 19–32)
CREATININE: 0.49 mg/dL — AB (ref 0.50–1.10)
GFR calc Af Amer: >90 mL/min (ref >90)
Glucose, Bld: 89 mg/dL (ref 70–99)
Potassium: 3.8 mEq/L (ref 3.7–5.3)
Sodium: 141 mEq/L (ref 137–147)
Total Protein: 6 g/dL (ref 6.0–8.3)

## 2013-12-01 LAB — HEMOGLOBIN A1C
HEMOGLOBIN A1C: 5.4 % (ref <5.7)
Mean Plasma Glucose: 108 mg/dL (ref <117)

## 2013-12-01 LAB — URINALYSIS, ROUTINE W REFLEX MICROSCOPIC
BILIRUBIN URINE: NEGATIVE
GLUCOSE, UA: NEGATIVE mg/dL
HGB URINE DIPSTICK: NEGATIVE
KETONES UR: NEGATIVE mg/dL
Nitrite: NEGATIVE
PROTEIN: NEGATIVE mg/dL
Specific Gravity, Urine: 1.01 (ref 1.005–1.030)
Urobilinogen, UA: 0.2 mg/dL (ref 0.0–1.0)
pH: 6 (ref 5.0–8.0)

## 2013-12-01 LAB — D-DIMER, QUANTITATIVE: D-Dimer, Quant: 0.39 ug/mL-FEU (ref 0.00–0.48)

## 2013-12-01 LAB — CBC
HCT: 38.4 % (ref 36.0–46.0)
Hemoglobin: 12.6 g/dL (ref 12.0–15.0)
MCH: 30.2 pg (ref 26.0–34.0)
MCHC: 32.8 g/dL (ref 30.0–36.0)
MCV: 92.1 fL (ref 78.0–100.0)
Platelets: 280 10*3/uL (ref 150–400)
RBC: 4.17 MIL/uL (ref 3.87–5.11)
RDW: 14 % (ref 11.5–15.5)
WBC: 7.1 10*3/uL (ref 4.0–10.5)

## 2013-12-01 LAB — TROPONIN I: Troponin I: <0.3 ng/mL (ref <0.30)

## 2013-12-01 LAB — MAGNESIUM: Magnesium: 1.8 mg/dL (ref 1.5–2.5)

## 2013-12-01 LAB — PHOSPHORUS: Phosphorus: 3.1 mg/dL (ref 2.3–4.6)

## 2013-12-01 LAB — TSH: TSH: 2.47 u[IU]/mL (ref 0.350–4.500)

## 2013-12-01 MED ORDER — ACETAMINOPHEN 650 MG RE SUPP: 650.0000 mg | Freq: Four times a day (QID) | RECTAL | Status: DC | PRN

## 2013-12-01 MED ORDER — ONDANSETRON HCL 4 MG PO TABS: 4.0000 mg | ORAL_TABLET | Freq: Four times a day (QID) | ORAL | Status: DC | PRN

## 2013-12-01 MED ORDER — ACETAMINOPHEN 325 MG PO TABS: 650.0000 mg | ORAL_TABLET | Freq: Four times a day (QID) | ORAL | Status: DC | PRN

## 2013-12-01 MED ORDER — ENOXAPARIN SODIUM 40 MG/0.4ML ~~LOC~~ SOLN
40.0000 mg | SUBCUTANEOUS | Status: DC
  Administered 2013-12-01: 40 mg via SUBCUTANEOUS
  Filled 2013-12-01 (×2): qty 0.4

## 2013-12-01 MED ORDER — METOPROLOL TARTRATE 12.5 MG HALF TABLET
12.5000 mg | ORAL_TABLET | Freq: Two times a day (BID) | ORAL | Status: DC
  Administered 2013-12-01 – 2013-12-02 (×3): 12.5 mg via ORAL
  Filled 2013-12-01 (×5): qty 1

## 2013-12-01 MED ORDER — SODIUM CHLORIDE 0.9 % IJ SOLN
3.0000 mL | Freq: Two times a day (BID) | INTRAMUSCULAR | Status: DC
  Administered 2013-12-01 (×3): 3 mL via INTRAVENOUS

## 2013-12-01 MED ORDER — HYDROCODONE-ACETAMINOPHEN 5-325 MG PO TABS: 1.0000 | ORAL_TABLET | ORAL | Status: DC | PRN

## 2013-12-01 MED ORDER — NAPROXEN 250 MG PO TABS
250.0000 mg | ORAL_TABLET | Freq: Two times a day (BID) | ORAL | Status: DC | PRN
  Administered 2013-12-01: 250 mg via ORAL
  Filled 2013-12-01 (×2): qty 1

## 2013-12-01 MED ORDER — DOCUSATE SODIUM 100 MG PO CAPS
100.0000 mg | ORAL_CAPSULE | Freq: Two times a day (BID) | ORAL | Status: DC
  Administered 2013-12-01: 100 mg via ORAL
  Filled 2013-12-01 (×4): qty 1

## 2013-12-01 MED ORDER — ASPIRIN EC 81 MG PO TBEC
81.0000 mg | DELAYED_RELEASE_TABLET | Freq: Every day | ORAL | Status: DC
  Filled 2013-12-01 (×2): qty 1

## 2013-12-01 MED ORDER — NAPROXEN 250 MG PO TABS
250.0000 mg | ORAL_TABLET | Freq: Every day | ORAL | Status: DC | PRN
  Administered 2013-12-01 (×2): 250 mg via ORAL
  Filled 2013-12-01: qty 1

## 2013-12-01 MED ORDER — ONDANSETRON HCL 4 MG/2ML IJ SOLN: 4.0000 mg | Freq: Four times a day (QID) | INTRAMUSCULAR | Status: DC | PRN

## 2013-12-01 MED ORDER — SODIUM CHLORIDE 0.9 % IV SOLN
INTRAVENOUS | Status: AC
  Administered 2013-12-01: 02:00:00 via INTRAVENOUS

## 2013-12-01 NOTE — Progress Notes (Signed)
Patient admitted by Dr. Roel Cluck after midnight, please see H&P.    Atrial fibrillation with RVR - currently converted back to sinus.  PO metoprolol  Echocardiogram  TSH ok D dimer ok CHADS2 score 0, we'll start with aspirin 81 mg a day.   Hypotension -IVF and monitor orthos ok  Chest pain atypical transient but will cycle cardiac enzymes  Angela Bear DO

## 2013-12-01 NOTE — ED Notes (Signed)
Pt states she is unable to urinate at this time; Pt states she has urinated prior to RN asking for sample.

## 2013-12-01 NOTE — Progress Notes (Signed)
  Echocardiogram 2D Echocardiogram has been performed.  Angela Morales 12/01/2013, 3:23 PM

## 2013-12-02 DIAGNOSIS — M129 Arthropathy, unspecified: Secondary | ICD-10-CM

## 2013-12-02 LAB — CBC
HCT: 37 % (ref 36.0–46.0)
HEMOGLOBIN: 12 g/dL (ref 12.0–15.0)
MCH: 30.3 pg (ref 26.0–34.0)
MCHC: 32.4 g/dL (ref 30.0–36.0)
MCV: 93.4 fL (ref 78.0–100.0)
Platelets: 286 10*3/uL (ref 150–400)
RBC: 3.96 MIL/uL (ref 3.87–5.11)
RDW: 14.3 % (ref 11.5–15.5)
WBC: 6.8 10*3/uL (ref 4.0–10.5)

## 2013-12-02 LAB — BASIC METABOLIC PANEL
BUN: 12 mg/dL (ref 6–23)
CALCIUM: 9 mg/dL (ref 8.4–10.5)
CO2: 25 meq/L (ref 19–32)
CREATININE: 0.55 mg/dL (ref 0.50–1.10)
Chloride: 108 mEq/L (ref 96–112)
GFR calc Af Amer: >90 mL/min (ref >90)
Glucose, Bld: 88 mg/dL (ref 70–99)
Potassium: 4.1 mEq/L (ref 3.7–5.3)
Sodium: 142 mEq/L (ref 137–147)

## 2013-12-02 LAB — URINE CULTURE
Colony Count: NO GROWTH
Culture: NO GROWTH

## 2013-12-02 MED ORDER — ASPIRIN 81 MG PO TBEC: 81.0000 mg | DELAYED_RELEASE_TABLET | Freq: Every day | ORAL | Status: DC

## 2013-12-02 NOTE — Progress Notes (Signed)
Pt d/c home.  Alert and oriented x4. No c/o pain. IV D/C'd.  Tele D/C'd.  Pt given education on diet, activity, meds, and follow-up care and instructions.  Pt verbalized understanding.

## 2013-12-02 NOTE — Discharge Summary (Addendum)
Physician Discharge Summary  Angela Morales ASN:[id number] DOB: [birth year] DOA: 11/30/2013  PCP: Thressa Sheller, MD  Admit date: 11/30/2013 Discharge date: 12/02/2013  Time spent: 35 minutes  Recommendations for Outpatient Follow-up:  1. Patient started on ASA- watch for signs of bleeding- may need another med for arthritis  Discharge Diagnoses:  Active Problems:   A-fib   Hypotension   Atrial flutter   Atrial fibrillation with RVR   Discharge Condition: improved  Diet recommendation: regular  Filed Weights   11/30/13 1943 12/01/13 0131  Weight: 64.411 kg (142 lb) 59.104 kg (130 lb 4.8 oz)    History of present illness:  Angela Morales is a 68 y.o. female  has a past medical history of Abnormal uterine bleeding; SVT (supraventricular tachycardia); Arthritis; and DDD (degenerative disc disease).  Presented with  Sensation of palpitations. She has been on crutches for the past 5 weeks after Left foot fracture. Patient have had intermittent short episodes of chest pain. Thursday night she had some palpitations. She have not been eating well and have had some nausea. Every time she tries to get up she feels light headed. She presented to ER and was found to be in tachycardiac rhythm thought to be possibly A. flutter she was given diltiazem and her HR went down from 130's to 110 repeat ECG showed A.fib with RVR. Her blood pressure went down as well to 80/40 ths has resolved once diltiazem was stopped. Currently she is chest pain free but gets occasional twinge in the center of her chest. Not worse with deep breaths or movement. Currently on the monitor patient back in sinus rhythmwith HR in 70-80's  Hospitalist was called for admission for new onset a.fib.   Hospital Course:  Atrial fibrillation with RVR - currently converted back to sinus.  PO metoprolol  Echocardiogram  TSH ok  D dimer ok  CHADS2-vas score 2, we'll start with aspirin 81 mg a day.- cautiously as patient is on  Naprosyn as well Hypotension -IVF ; resolved  orthos ok  Chest pain atypical transient; cardiac enzymes negative   Procedures: Echo: Study Conclusions  - Left ventricle: The cavity size was normal. Wall thickness was normal. Systolic function was normal. The estimated ejection fraction was in the range of 55% to 60%. Wall motion was normal; there were no regional wall motion abnormalities. Left ventricular diastolic function parameters were normal. - Mitral valve: Mildly thickened leaflets . There was mild regurgitation. - Left atrium: The atrium was normal in size. - Right atrium: The atrium was normal in size. - Atrial septum: No defect or patent foramen ovale was identified.  Impressions:  - LVEF 55-60%, normal wall thickness, normal biatrial sizes, thickened mitral leaflets with mild MR. Normal diastolic function.    Consultations:  none  Discharge Exam: Filed Vitals:   12/02/13 0400  BP: 113/69  Pulse: 64  Temp: 97.7 F (36.5 C)  Resp: 16    General: A+Ox3, NAD Cardiovascular: rrr Respiratory: clear  Discharge Instructions You were cared for by a hospitalist during your hospital stay. If you have any questions about your discharge medications or the care you received while you were in the hospital after you are discharged, you can call the unit and asked to speak with the hospitalist on call if the hospitalist that took care of you is not available. Once you are discharged, your primary care physician will handle any further medical issues. Please note that NO REFILLS for any discharge medications will be authorized once  you are discharged, as it is imperative that you return to your primary care physician (or establish a relationship with a primary care physician if you do not have one) for your aftercare needs so that they can reassess your need for medications and monitor your lab values.      Discharge Instructions   Diet general    Complete by:  As  directed      Increase activity slowly    Complete by:  As directed             Medication List         aspirin 81 MG EC tablet  Take 1 tablet (81 mg total) by mouth daily.     estradiol 0.025 mg/24hr patch  Commonly known as:  CLIMARA - Dosed in mg/24 hr  Place 0.025 mg onto the skin once a week. Mondays     FISH OIL PO  Take 1 capsule by mouth daily.     fluticasone 50 MCG/ACT nasal spray  Commonly known as:  FLONASE  Place 1 spray into both nostrils daily as needed for allergies.     GLUCOSAMINE PO  Take 1 tablet by mouth daily.     LOTEMAX 0.5 % ophthalmic suspension  Generic drug:  loteprednol  Place 1 drop into both eyes daily.     metoprolol tartrate 25 MG tablet  Commonly known as:  LOPRESSOR  Take 12.5 mg by mouth 2 (two) times daily. 1/2 bid     naproxen sodium 220 MG tablet  Commonly known as:  ANAPROX  Take 220 mg by mouth daily as needed. For pain     psyllium 58.6 % packet  Commonly known as:  METAMUCIL  Take 1 packet by mouth daily.     VITAMIN D PO  Take 2,000 Int'l Units by mouth daily.       Allergies  Allergen Reactions  . Cefdinir Rash      The results of significant diagnostics from this hospitalization (including imaging, microbiology, ancillary and laboratory) are listed below for reference.    Significant Diagnostic Studies: Dg Chest 2 View  11/30/2013   CLINICAL DATA:  Chest pain.  Palpitations.  Lightheadedness.  EXAM: CHEST  2 VIEW  COMPARISON:  Chest x-ray 04/19/2005.  FINDINGS: Lung volumes are normal. No consolidative airspace disease. No pleural effusions. No pneumothorax. No pulmonary nodule or mass noted. Pulmonary vasculature and the cardiomediastinal silhouette are within normal limits.  IMPRESSION: No radiographic evidence of acute cardiopulmonary disease.   Electronically Signed   By: Vinnie Langton M.D.   On: 11/30/2013 20:14    Microbiology: No results found for this or any previous visit (from the past 240  hour(s)).   Labs: Basic Metabolic Panel:  Recent Labs Lab 11/30/13 1955 12/01/13 0200 12/02/13 0326  NA 139 141 142  K 4.8 3.8 4.1  CL 101 105 108  CO2 26 25 25   GLUCOSE 99 89 88  BUN 18 15 12   CREATININE 0.55 0.49* 0.55  CALCIUM 9.5 8.6 9.0  MG  --  1.8  --   PHOS  --  3.1  --    Liver Function Tests:  Recent Labs Lab 12/01/13 0200  AST 22  ALT 16  ALKPHOS 68  BILITOT 0.3  PROT 6.0  ALBUMIN 3.1*   No results found for this basename: LIPASE, AMYLASE,  in the last 168 hours No results found for this basename: AMMONIA,  in the last 168 hours CBC:  Recent Labs  Lab 11/30/13 1955 12/01/13 0200 12/02/13 0326  WBC 10.0 7.1 6.8  HGB 14.9 12.6 12.0  HCT 44.5 38.4 37.0  MCV 92.7 92.1 93.4  PLT 321 280 286   Cardiac Enzymes:  Recent Labs Lab 12/01/13 0200 12/01/13 0640 12/01/13 1325  TROPONINI <0.30 <0.30 <0.30   BNP: BNP (last 3 results) No results found for this basename: PROBNP,  in the last 8760 hours CBG: No results found for this basename: GLUCAP,  in the last 168 hours     Signed:  Geradine Girt  [facility] 12/02/2013, 10:14 AM

## 2013-12-03 NOTE — Progress Notes (Signed)
Utilization Review Completed.Angela Kincer T Dowell6/01/2014  

## 2014-02-04 ENCOUNTER — Encounter: Payer: Self-pay | Admitting: *Deleted

## 2014-02-06 ENCOUNTER — Telehealth: Payer: Self-pay

## 2014-02-06 NOTE — Telephone Encounter (Signed)
Spoke with patient. Advised patient that we have received a request from [facility] for her estradiol. Advised patient that Debbi would like to see her for an office visit before she refills. "I was just seen in January so I am not sure why it is not refillable for a year." Advised patient with her recent visit to the hospital in June Debbi would like to discuss this and medication with patient before refilling. Patient is agreeable but states that she is at a store and will call back to schedule appointment.

## 2014-02-06 NOTE — Telephone Encounter (Signed)
Scheduled pt for Friday at 12:45

## 2014-02-06 NOTE — Telephone Encounter (Signed)
Routing to provider for final review. Patient agreeable to disposition. Will close encounter.     

## 2014-02-08 ENCOUNTER — Ambulatory Visit (INDEPENDENT_AMBULATORY_CARE_PROVIDER_SITE_OTHER): Payer: Medicare Other | Admitting: Certified Nurse Midwife

## 2014-02-08 ENCOUNTER — Encounter: Payer: Self-pay | Admitting: Certified Nurse Midwife

## 2014-02-08 VITALS — BP 104/64 | HR 64 | Resp 16 | Ht 64.5 in | Wt 138.0 lb

## 2014-02-08 DIAGNOSIS — N951 Menopausal and female climacteric states: Secondary | ICD-10-CM

## 2014-02-08 NOTE — Progress Notes (Signed)
68 y.o. Married Caucasian female [phone] here for discussion of continued use of ERT. Patient had left foot fracture with surgery due to a fall in 6/15. She was not taken off her ERT before surgery and has continued to use Climara 0.025 mg patch weekly. Patient was seen 11/30/13 in the ER for atrial fib with chest pain. Spent 2 days in hospital for evaluation and determined it was using crutches for walking that was causing chest pain. Atrial fib evaluated with echo and monitoring and resolved with no medication treatment. Patient had history of SVT but no issues in years. Atrial Fib per patient was caused by Ultram use for pain. Patient main concern is mother had severe Osteoporosis with lung compression and she is trying to prevent this. Patient is active and lives healthy life style. She is experiencing no hot flashes or night sweats. Patient has degenerative disc disease with previous imaging.   O: Healthy WD,WN female Affect: Normal  A:Menopausal on ERT desires continuance Recent episodic atrial fib with hospitalization, felt to be medication oriented per patient. Recent foot fracture from fall with surgery   P: Discussed her history and current medications of Lopressor which is stable, on Vitamin D and calcium supplementation. No added medications from hospital evaluation.Discussed risks and benefits of ERT with cardiovascular changes and osteoporosis. Discussed requesting records from admission of evaluation for adequate review for continuation of ERT. Discussed the implications for use are the symptomatic relief of menopausal symptoms. Discussed stopping due to current history. Patient has two more patches and plans to use. She does not feel there is a concern at this point. Patient feels review of evaluation is reasonable and will do what is recommended. She has researched all the options to prevent Osteoporosis and feels this is still a good choice.  Addendum: ER summary now in Epic and did show  patient in Atrial Fib when admitted and was converted with medication use. Echo showed mild slightly thickened mitral valve, other wise normal. Patient sent home on aspirin.         Rv prn   27 minutes spent with patient  in face to face counseling.

## 2014-02-08 NOTE — Patient Instructions (Signed)

## 2014-02-18 ENCOUNTER — Telehealth: Payer: Self-pay | Admitting: Emergency Medicine

## 2014-02-18 NOTE — Telephone Encounter (Signed)
Message copied by Joeseph Amor on Mon Feb 18, 2014 11:33 AM ------      Message from: Verner Chol      Created: Mon Feb 18, 2014 10:40 AM                   ----- Message -----         From: Annamaria Boots, MD         Sent: 02/18/2014  10:33 AM           To: Verner Chol, CNM                        ----- Message -----         From: Verner Chol, CNM         Sent: 02/18/2014   8:38 AM           To: Annamaria Boots, MD             ------

## 2014-02-18 NOTE — Telephone Encounter (Signed)
Spoke with patient and message from Verner Chol CNM given.  Patient states "this is what I expected" and she did not have any further questions.  Routing to provider for final review. Patient agreeable to disposition. Will close encounter

## 2014-02-18 NOTE — Progress Notes (Signed)
Please notify patient that I and  Dr. Hyacinth Meeker have reviewed her records from her ER visit with Atrial Fib. We feel the Atrial Fib. Is a greater risk and she is now not a candidate for continued HRT use. She should stop her HRT. She may have some hot flashes but this should subside after a few weeks. Work on other ways of bone protection with exercise, diet and continue supplements.

## 2014-02-18 NOTE — Telephone Encounter (Signed)
Annamaria Boots, MD at 02/18/2014 10:33 AM    Status: Signed       Reviewed ER note. Agree that clotting risk is much increased if goes back into a fib. On ASA but feel she should stop HRT now. Risks outweigh benefits. Reviewed personally. Lum Keas, MD.        Verner Chol, CNM at 02/18/2014 10:37 AM     Status: Signed        Please notify patient that I and Dr. Hyacinth Meeker have reviewed her records from her ER visit with Atrial Fib. We feel the Atrial Fib. Is a greater risk and she is now not a candidate for continued HRT use. She should stop her HRT. She may have some hot flashes but this should subside after a few weeks. Work on other ways of bone protection with exercise, diet and continue supplements.

## 2014-02-18 NOTE — Progress Notes (Signed)
Reviewed ER note.  Agree that clotting risk is much increased if goes back into a fib.  On ASA but feel she should stop HRT now.  Risks outweigh benefits.  Reviewed personally.  Lum Keas, MD.

## 2014-02-22 ENCOUNTER — Encounter: Payer: Self-pay | Admitting: Internal Medicine

## 2014-04-29 ENCOUNTER — Encounter: Payer: Self-pay | Admitting: Certified Nurse Midwife

## 2014-08-06 DIAGNOSIS — Z1231 Encounter for screening mammogram for malignant neoplasm of breast: Secondary | ICD-10-CM | POA: Diagnosis not present

## 2014-08-06 DIAGNOSIS — Z803 Family history of malignant neoplasm of breast: Secondary | ICD-10-CM | POA: Diagnosis not present

## 2014-08-07 ENCOUNTER — Ambulatory Visit (INDEPENDENT_AMBULATORY_CARE_PROVIDER_SITE_OTHER): Payer: Medicare Other | Admitting: Nurse Practitioner

## 2014-08-07 ENCOUNTER — Encounter: Payer: Self-pay | Admitting: Nurse Practitioner

## 2014-08-07 VITALS — BP 108/66 | HR 72 | Ht 64.5 in | Wt 137.0 lb

## 2014-08-07 DIAGNOSIS — I4891 Unspecified atrial fibrillation: Secondary | ICD-10-CM

## 2014-08-07 DIAGNOSIS — Z1211 Encounter for screening for malignant neoplasm of colon: Secondary | ICD-10-CM | POA: Diagnosis not present

## 2014-08-07 DIAGNOSIS — Z8262 Family history of osteoporosis: Secondary | ICD-10-CM | POA: Diagnosis not present

## 2014-08-07 DIAGNOSIS — M069 Rheumatoid arthritis, unspecified: Secondary | ICD-10-CM | POA: Diagnosis not present

## 2014-08-07 DIAGNOSIS — Z01419 Encounter for gynecological examination (general) (routine) without abnormal findings: Secondary | ICD-10-CM | POA: Diagnosis not present

## 2014-08-07 NOTE — Patient Instructions (Addendum)

## 2014-08-07 NOTE — Progress Notes (Addendum)
Patient ID: Angela Morales, female   DOB: 1946-05-16, 69 y.o.   MRN: [id number] 69 y.o. [id number] Married  Caucasian Fe here for annual exam.  Increase in intraocular pressure but not diagnosed with glaucoma.  Now on Atenolol to reduce fluid in the eye.  Also now off ERT since last summer after hospitalization for A Fib.  Thinking this was related to fracture left foot with ORIF and pain management and use of Ultram.  She is well controlled on Beta Blocker med's and ASA daily.  She has been stable on this regimen.  Patient's last menstrual period was 04/20/2005.          Sexually active: yes  The current method of family planning is status post hysterectomy.  Exercising: yes walking Smoker: no  Health Maintenance: Pap: 02/03/05, negative (hyst) MMG: 08/06/14, [facility], no results at time of appointment Colonoscopy:  03/30/04, diverticulosis, repeat in 10 years, Dr. Juanda Chance BMD: 2014 normal per patient TDaP: 2012 Shingles: 2009 Prevnar 13: not yet Labs:  Routine labs and Vit D followed by PCP   reports that she has never smoked. She does not have any smokeless tobacco history on file. She reports that she drinks about 2.4 - 3.6 oz of alcohol per week. She reports that she does not use illicit drugs.  Past Medical History  Diagnosis Date  . Abnormal uterine bleeding     on HRT  . SVT (supraventricular tachycardia)   . Arthritis   . DDD (degenerative disc disease)     Past Surgical History  Procedure Laterality Date  . Tonsillectomy and adenoidectomy    . Tubal ligation  1981  . Vaginal hysterectomy  04/20/05    prolapse, adenomyosis  . Appendectomy    . Foot fracture surgery  10/2013    Current Outpatient Prescriptions  Medication Sig Dispense Refill  . aspirin EC 81 MG EC tablet Take 1 tablet (81 mg total) by mouth daily.    . Cholecalciferol (VITAMIN D PO) Take 2,000 Int'l Units by mouth daily.     . Glucosamine HCl (GLUCOSAMINE PO) Take 1 tablet by mouth daily.     Marland Kitchen  LOTEMAX 0.5 % ophthalmic suspension Place 1 drop into both eyes daily.     . metoprolol tartrate (LOPRESSOR) 25 MG tablet Take 12.5 mg by mouth 2 (two) times daily. 1/2 bid    . naproxen sodium (ANAPROX) 220 MG tablet Take 220 mg by mouth. Takes 2-3 tabs daily    . Omega-3 Fatty Acids (FISH OIL PO) Take 1 capsule by mouth daily.     . psyllium (METAMUCIL) 58.6 % packet Take 1 packet by mouth daily.    . timolol (BETIMOL) 0.25 % ophthalmic solution Place 1 drop into both eyes 2 (two) times daily.     No current facility-administered medications for this visit.    Family History  Problem Relation Age of Onset  . Osteoporosis Mother   . Stroke Mother     mini  . Prostate cancer Father   . Heart disease Brother   . Breast cancer Cousin     1st cousin    ROS:  Pertinent items are noted in HPI.  Otherwise, a comprehensive ROS was negative.  Exam:   BP 108/66 mmHg  Pulse 72  Ht 5' 4.5" (1.638 m)  Wt 137 lb (62.143 kg)  BMI 23.16 kg/m2  LMP 04/20/2005 Height: 5' 4.5" (163.8 cm) Ht Readings from Last 3 Encounters:  08/07/14 5' 4.5" (1.638 m)  02/08/14 5'  4.5" (1.638 m)  11/30/13 5\' 5"  (1.651 m)    General appearance: alert, cooperative and appears stated age Head: Normocephalic, without obvious abnormality, atraumatic Neck: no adenopathy, supple, symmetrical, trachea midline and thyroid normal to inspection and palpation Lungs: clear to auscultation bilaterally Breasts: normal appearance, no masses or tenderness Heart: regular rate and rhythm Abdomen: soft, non-tender; no masses,  no organomegaly Extremities: extremities normal, atraumatic, no cyanosis or edema Skin: Skin color, texture, turgor normal. No rashes or lesions Lymph nodes: Cervical, supraclavicular, and axillary nodes normal. No abnormal inguinal nodes palpated Neurologic: Grossly normal   Pelvic: External genitalia:  no lesions              Urethra:  normal appearing urethra with no masses, tenderness or  lesions              Bartholin's and Skene's: normal                 Vagina: normal appearing vagina with normal color and discharge, no lesions              Cervix: absent              Pap taken: No. Bimanual Exam:  Uterus:  uterus absent              Adnexa: no mass, fullness, tenderness               Rectovaginal: Confirms               Anus:  normal sphincter tone, no lesions  Chaperone present:  no  A:  Well Woman with normal exam  Menopausal off ERT summer 2015   S/P TVH & TVT Dr. 07-11-2006 - due to prolapse an adenomyosis, ovaries retained 03/2005 Recent left foot fracture 09/2013 Hypertension stable on medication with PCP management  A Fib controlled on med's 11/2013, history of SVT  ? Glaucoma right eye - better on med's  FMH: osteoporosis   P:   Reviewed health and wellness pertinent to exam  Pap smear not taken today  Mammogram is due 07/2015  ROI for BMD 2014 - unsur if this was done  Order for BMD end of this year  IFOB is given  Counseled on breast self exam, mammography screening, osteoporosis, adequate intake of calcium and vitamin D, diet and exercise, Kegel's exercises return annually or prn  An After Visit Summary was printed and given to the patient.

## 2014-08-08 DIAGNOSIS — H40053 Ocular hypertension, bilateral: Secondary | ICD-10-CM | POA: Diagnosis not present

## 2014-08-09 NOTE — Progress Notes (Signed)
Reviewed personally.  M. Suzanne Tashya Alberty, MD.  

## 2014-08-28 ENCOUNTER — Encounter: Payer: Self-pay | Admitting: Internal Medicine

## 2014-09-03 LAB — FECAL OCCULT BLOOD, IMMUNOCHEMICAL: IFOBT: NEGATIVE

## 2014-09-03 NOTE — Addendum Note (Signed)
Addended by: Elisha Headland on: 09/03/2014 11:40 AM   Modules accepted: Orders

## 2014-09-04 DIAGNOSIS — M810 Age-related osteoporosis without current pathological fracture: Secondary | ICD-10-CM | POA: Diagnosis not present

## 2014-10-02 ENCOUNTER — Telehealth: Payer: Self-pay | Admitting: Nurse Practitioner

## 2014-10-02 NOTE — Telephone Encounter (Signed)
Please let pt. Know that BMD done 09/04/14 shows a T Score: at the spine of -0.7; left hip -0.7; right hip -0.7; right forearm at -2.6.  All the ranges are at normal except for the forearm which falls in the osteoporosis range.  Comparison studies from 2012 shows a decrease at both hips but still within the normal range.  FRAX score for major fracture in 10 years is 19.4% (goal is <20%) - I believe this is higher because of score at radius.  FRAX score for hip fracture in 10 years 1.8% (goal is <3%).  She needs to maintain walking exercise and must do upper body weights at least 4 times a week.  Continue calcium and Vit D.

## 2014-10-03 DIAGNOSIS — I471 Supraventricular tachycardia: Secondary | ICD-10-CM | POA: Diagnosis not present

## 2014-10-03 DIAGNOSIS — E559 Vitamin D deficiency, unspecified: Secondary | ICD-10-CM | POA: Diagnosis not present

## 2014-10-03 DIAGNOSIS — E785 Hyperlipidemia, unspecified: Secondary | ICD-10-CM | POA: Diagnosis not present

## 2014-10-08 ENCOUNTER — Encounter: Payer: Self-pay | Admitting: Internal Medicine

## 2014-10-10 DIAGNOSIS — H6120 Impacted cerumen, unspecified ear: Secondary | ICD-10-CM | POA: Diagnosis not present

## 2014-10-10 DIAGNOSIS — J309 Allergic rhinitis, unspecified: Secondary | ICD-10-CM | POA: Diagnosis not present

## 2014-10-10 DIAGNOSIS — Z23 Encounter for immunization: Secondary | ICD-10-CM | POA: Diagnosis not present

## 2014-10-10 DIAGNOSIS — I471 Supraventricular tachycardia: Secondary | ICD-10-CM | POA: Diagnosis not present

## 2014-10-10 DIAGNOSIS — M810 Age-related osteoporosis without current pathological fracture: Secondary | ICD-10-CM | POA: Diagnosis not present

## 2014-10-14 ENCOUNTER — Ambulatory Visit (AMBULATORY_SURGERY_CENTER): Payer: Self-pay | Admitting: *Deleted

## 2014-10-14 ENCOUNTER — Telehealth: Payer: Self-pay | Admitting: Cardiovascular Disease

## 2014-10-14 VITALS — Ht 64.75 in | Wt 134.0 lb

## 2014-10-14 DIAGNOSIS — Z1211 Encounter for screening for malignant neoplasm of colon: Secondary | ICD-10-CM

## 2014-10-14 MED ORDER — NA SULFATE-K SULFATE-MG SULF 17.5-3.13-1.6 GM/177ML PO SOLN: 1.0000 | Freq: Once | ORAL | Status: AC

## 2014-10-14 MED ORDER — MOVIPREP 100 G PO SOLR: 1.0000 | Freq: Once | ORAL | Status: DC

## 2014-10-14 NOTE — Telephone Encounter (Signed)
Talked to [facility] on 10/14/14 at 12:45 pm. Moviprep is not covered by patient's insurance. Sent over Rx for Suprep to see if that would be covered. Patient's colonoscopy is on 10/25/14 at 9:00 am with Dr. Juanda Chance.

## 2014-10-14 NOTE — Telephone Encounter (Signed)
Received records from [facility] for appointment on 12/09/14 with Dr Sallyanne Kuster.  Records given to [facility] (medical records) for Dr Croitoru's schedule on 12/09/14. lp

## 2014-10-14 NOTE — Progress Notes (Signed)
No egg or soy allergy. No anesthesia problems.  No home O2.  No diet meds.

## 2014-10-16 ENCOUNTER — Telehealth: Payer: Self-pay | Admitting: *Deleted

## 2014-10-16 NOTE — Telephone Encounter (Signed)
Called the patient at [phone] (H) to let them know their insurance would not cover Moviprep and that I sent over to their pharmacy Suprep. Talked to [facility] in [employer] to ask how much the Suprep would cost. The cost is $67.94. I told the patient how much it would cost. I told the patient that I would reprint out new instructions for Suprep. The patient will come to our office to pickup new instructions. Patient stated they understood. New instructions have been printed. Waiting for patient to pick them up.

## 2014-10-16 NOTE — Telephone Encounter (Signed)
Pt notified of results.  She has arthritis in her hands and is unable to hold weights, encouraged patient to do what she could tolerate.  Pt will avoid ladders, loose rugs and other fall hazards to avoid needing to catch herself with her hands.  Pt will continue walking, calcium and vit d.

## 2014-10-17 NOTE — Telephone Encounter (Signed)
Patient came by on 10/17/14 at 2:50 pm. Read the patient Suprep instructions. Patient stated she understood.

## 2014-10-25 ENCOUNTER — Ambulatory Visit (AMBULATORY_SURGERY_CENTER): Payer: Medicare Other | Admitting: Internal Medicine

## 2014-10-25 ENCOUNTER — Encounter: Payer: Self-pay | Admitting: Internal Medicine

## 2014-10-25 VITALS — BP 116/65 | HR 68 | Temp 98.6°F | Resp 18 | Ht 64.0 in | Wt 134.0 lb

## 2014-10-25 DIAGNOSIS — Z1211 Encounter for screening for malignant neoplasm of colon: Secondary | ICD-10-CM

## 2014-10-25 MED ORDER — SODIUM CHLORIDE 0.9 % IV SOLN: 500.0000 mL | INTRAVENOUS | Status: DC

## 2014-10-25 NOTE — Progress Notes (Signed)
To recovery, report to McCoy, RN, VSS 

## 2014-10-25 NOTE — Op Note (Signed)
[facility] [address] [place], [address]   COLONOSCOPY PROCEDURE REPORT  PATIENT: Angela Morales, Angela Morales  MR#: [id number] BIRTHDATE: 1945-09-10 , 68  yrs. old GENDER: female ENDOSCOPIST: Hart Carwin, MD REFERRED YK:[id number] Thea Silversmith, M.D. PROCEDURE DATE:  10/25/2014 PROCEDURE:   Colonoscopy, screening First Screening Colonoscopy - Avg.  risk and is 50 yrs.  old or older - No.  Prior Negative Screening - Now for repeat screening. 10 or more years since last screening  History of Adenoma - Now for follow-up colonoscopy & has been > or = to 3 yrs.  N/A ASA CLASS:   Class I INDICATIONS:Screening for colonic neoplasia, Colorectal Neoplasm Risk Assessment for this procedure is average risk, and prior colonoscopy in October 2005 showed diverticulosis of the left colon. MEDICATIONS: Monitored anesthesia care and Propofol 250 mg IV  DESCRIPTION OF PROCEDURE:   After the risks benefits and alternatives of the procedure were thoroughly explained, informed consent was obtained.  The digital rectal exam revealed no abnormalities of the rectum.   The LB PCF Q180 [id number]  endoscope was introduced through the anus and advanced to the cecum, which was identified by both the appendix and ileocecal valve. No adverse events experienced.   The quality of the prep was good.  (MoviPrep was used)  The instrument was then slowly withdrawn as the colon was fully examined.      COLON FINDINGS: There was moderate diverticulosis noted throughout the entire examined colon with associated angulation, muscular hypertrophy and tortuosity.  Retroflexed views revealed no abnormalities. The time to cecum = 18.50 Withdrawal time = 6.03 The scope was withdrawn and the procedure completed. COMPLICATIONS: There were no immediate complications.  ENDOSCOPIC IMPRESSION: There was moderate diverticulosis noted throughout the entire examined colon  RECOMMENDATIONS: High fiber diet Benefiber 1  teaspoon daily Recall colonoscopy in 10 years  eSigned:  Hart Carwin, MD 10/25/2014 10:07 AM   cc:

## 2014-10-25 NOTE — Patient Instructions (Signed)
Discharge instructions given. Handouts on diverticulosis and a high fiber diet Resume previous medications. YOU HAD AN ENDOSCOPIC PROCEDURE TODAY AT THE [facility]:   Refer to the procedure report that was given to you for any specific questions about what was found during the examination.  If the procedure report does not answer your questions, please call your gastroenterologist to clarify.  If you requested that your care partner not be given the details of your procedure findings, then the procedure report has been included in a sealed envelope for you to review at your convenience later.  YOU SHOULD EXPECT: Some feelings of bloating in the abdomen. Passage of more gas than usual.  Walking can help get rid of the air that was put into your GI tract during the procedure and reduce the bloating. If you had a lower endoscopy (such as a colonoscopy or flexible sigmoidoscopy) you may notice spotting of blood in your stool or on the toilet paper. If you underwent a bowel prep for your procedure, you may not have a normal bowel movement for a few days.  Please Note:  You might notice some irritation and congestion in your nose or some drainage.  This is from the oxygen used during your procedure.  There is no need for concern and it should clear up in a day or so.  SYMPTOMS TO REPORT IMMEDIATELY:   Following lower endoscopy (colonoscopy or flexible sigmoidoscopy):  Excessive amounts of blood in the stool  Significant tenderness or worsening of abdominal pains  Swelling of the abdomen that is new, acute  Fever of 100F or higher   For urgent or emergent issues, a gastroenterologist can be reached at any hour by calling [phone].   DIET: Your first meal following the procedure should be a small meal and then it is ok to progress to your normal diet. Heavy or fried foods are harder to digest and may make you feel nauseous or bloated.  Likewise, meals heavy in dairy and vegetables  can increase bloating.  Drink plenty of fluids but you should avoid alcoholic beverages for 24 hours.  ACTIVITY:  You should plan to take it easy for the rest of today and you should NOT DRIVE or use heavy machinery until tomorrow (because of the sedation medicines used during the test).    FOLLOW UP: Our staff will call the number listed on your records the next business day following your procedure to check on you and address any questions or concerns that you may have regarding the information given to you following your procedure. If we do not reach you, we will leave a message.  However, if you are feeling well and you are not experiencing any problems, there is no need to return our call.  We will assume that you have returned to your regular daily activities without incident.  If any biopsies were taken you will be contacted by phone or by letter within the next 1-3 weeks.  Please call us at [phone] if you have not heard about the biopsies in 3 weeks.    SIGNATURES/CONFIDENTIALITY: You and/or your care partner have signed paperwork which will be entered into your electronic medical record.  These signatures attest to the fact that that the information above on your After Visit Summary has been reviewed and is understood.  Full responsibility of the confidentiality of this discharge information lies with you and/or your care-partner. 

## 2014-10-28 ENCOUNTER — Telehealth: Payer: Self-pay

## 2014-10-28 NOTE — Telephone Encounter (Signed)
  Follow up Call-  Call back number 10/25/2014  Post procedure Call Back phone  # [phone]  Permission to leave phone message Yes     Patient questions:  Do you have a fever, pain , or abdominal swelling? No. Pain Score  0 *  Have you tolerated food without any problems? Yes.    Have you been able to return to your normal activities? Yes.    Do you have any questions about your discharge instructions: Diet   No. Medications  No. Follow up visit  No.  Do you have questions or concerns about your Care? No.  Actions: * If pain score is 4 or above: No action needed, pain <4.

## 2014-12-09 ENCOUNTER — Ambulatory Visit (INDEPENDENT_AMBULATORY_CARE_PROVIDER_SITE_OTHER): Payer: Medicare Other | Admitting: Cardiovascular Disease

## 2014-12-09 ENCOUNTER — Encounter: Payer: Self-pay | Admitting: Cardiovascular Disease

## 2014-12-09 VITALS — BP 118/60 | HR 67 | Ht 64.75 in | Wt 133.6 lb

## 2014-12-09 DIAGNOSIS — R002 Palpitations: Secondary | ICD-10-CM

## 2014-12-09 DIAGNOSIS — I483 Typical atrial flutter: Secondary | ICD-10-CM | POA: Diagnosis not present

## 2014-12-09 NOTE — Patient Instructions (Signed)
Your physician recommends that you schedule a follow-up appointment in: One Year.  

## 2014-12-10 NOTE — Progress Notes (Signed)
Patient ID: Angela Morales, female   DOB: [birth year], 69 y.o.   MRN: [id number]     Cardiology Office Note   Date:  12/10/2014   ID:  Angela Morales, DOB 1945-07-16, MRN [id number]  PCP:  Thressa Sheller, MD  Cardiologist:   Sanda Klein, MD   Chief Complaint  Patient presents with  . Establish Care    has had a few episodes of Afib.no chest discomfort,no swelling or pain of feet.question on medication and reevaluating them      History of Present Illness: Angela Morales is a 69 y.o. female who presents to establish cardiology follow-up roughly one year after an emergency room evaluation for atrial flutter with rapid ventricular response. At that time she presented with rapid palpitations and a heart rate of about 130 bpm with an electrocardiogram that most likely represents atrial flutter with 2:1 block. A subsequent electrocortical gram performed around 3 hours later possibly shows atrial fibrillation, but there is a lot of baseline artifact making interpretation difficult. By midnight (roughly 5 hours after presentation) she was back to normal on its rhythm with a normal ECG. She does not have systemic hypertension, diabetes, congestive heart failure or previous history of stroke, TIA or other embolic events.   Her initial presentation with arrhythmia was roughly 10 years ago when she had "SVT" with a rate of 240 bpm and near-syncope. She was started on a beta blocker then by Dr. Concepcion Elk. She had no events over the next 10 years. Since her emergency room evaluation in 2015 she has had one episode that lasted for less than an hour. She believes her symptoms are worsened by caffeine or emotional stress. She has palpitations she does not have any simultaneous problems with pain, dyspnea, diaphoresis, or cardiac complaints.  CHADSVasc score is 2 (age and gender). She is only on aspirin and is very reluctant to start anticoagulation. She recently stopped taking Estrogen  supplements.  She is a retired Software engineer. She is very fit and active. She walks about half a mile 2-3 days a week.    Past Medical History  Diagnosis Date  . Abnormal uterine bleeding     on HRT  . SVT (supraventricular tachycardia)   . Arthritis   . DDD (degenerative disc disease)   . Allergy     seasonal  . Glaucoma   . Atrial fibrillation     Past Surgical History  Procedure Laterality Date  . Tonsillectomy and adenoidectomy    . Tubal ligation  1981  . Vaginal hysterectomy  04/20/05    prolapse, adenomyosis  . Appendectomy    . Foot fracture surgery  10/2013  . Colonoscopy       Current Outpatient Prescriptions  Medication Sig Dispense Refill  . aspirin EC 81 MG EC tablet Take 1 tablet (81 mg total) by mouth daily.    . Cholecalciferol (VITAMIN D PO) Take 2,000 Int'l Units by mouth daily.     . Glucosamine HCl (GLUCOSAMINE PO) Take 1 tablet by mouth daily.     Marland Kitchen LOTEMAX 0.5 % ophthalmic suspension Place 1 drop into both eyes daily.     . metoprolol tartrate (LOPRESSOR) 25 MG tablet Take 12.5 mg by mouth 2 (two) times daily. 1/2 bid    . naproxen sodium (ANAPROX) 220 MG tablet Take 220 mg by mouth. Takes 2-3 tabs daily    . Omega-3 Fatty Acids (FISH OIL PO) Take 1 capsule by mouth daily.     . psyllium (  METAMUCIL) 58.6 % packet Take 1 packet by mouth daily.    . timolol (BETIMOL) 0.25 % ophthalmic solution Place 1 drop into both eyes 2 (two) times daily.     No current facility-administered medications for this visit.    Allergies:   Cefdinir    Social History:  The patient  reports that she has never smoked. She has never used smokeless tobacco. She reports that she drinks about 2.4 - 3.6 oz of alcohol per week. She reports that she does not use illicit drugs.   Family History:  The patient's family history includes Bipolar disorder in her son; Breast cancer in her cousin; Heart disease (age of onset: 70) in her brother; Liver disease in her  son; Osteoarthritis in her maternal grandmother; Osteoporosis in her mother; Prostate cancer in her father; Stroke in her mother. There is no history of Colon cancer.    ROS:  Please see the history of present illness.    Otherwise, review of systems positive for none.   All other systems are reviewed and negative.    PHYSICAL EXAM: VS:  BP 118/60 mmHg  Pulse 67  Ht 5' 4.75" (1.645 m)  Wt 133 lb 9.6 oz (60.601 kg)  BMI 22.39 kg/m2  LMP 04/20/2005 , BMI Body mass index is 22.39 kg/(m^2).  General: Alert, oriented x3, no distress Head: no evidence of trauma, PERRL, EOMI, no exophtalmos or lid lag, no myxedema, no xanthelasma; normal ears, nose and oropharynx Neck: normal jugular venous pulsations and no hepatojugular reflux; brisk carotid pulses without delay and no carotid bruits Chest: clear to auscultation, no signs of consolidation by percussion or palpation, normal fremitus, symmetrical and full respiratory excursions Cardiovascular: normal position and quality of the apical impulse, regular rhythm, normal first and second heart sounds, no murmurs, rubs or gallops Abdomen: no tenderness or distention, no masses by palpation, no abnormal pulsatility or arterial bruits, normal bowel sounds, no hepatosplenomegaly Extremities: no clubbing, cyanosis or edema; 2+ radial, ulnar and brachial pulses bilaterally; 2+ right femoral, posterior tibial and dorsalis pedis pulses; 2+ left femoral, posterior tibial and dorsalis pedis pulses; no subclavian or femoral bruits Neurological: grossly nonfocal Psych: euthymic mood, full affect   EKG:  EKG is ordered today. The ekg ordered today demonstrates normal sinus rhythm, QRS 74 ms, QTC 420 ms   Recent Labs: Magnesium 2.1, hemoglobin 13.1, creatinine 0.5, TSH 1.79 Total cholesterol 172, HDLC 35, LDL C1 119, triglycerides 92  Wt Readings from Last 3 Encounters:  12/09/14 133 lb 9.6 oz (60.601 kg)  10/25/14 134 lb (60.782 kg)  10/14/14 134 lb  (60.782 kg)    Echocardiogram June 2015 : shows normal left ventricular size, wall thickness and systolic function the absence of any serious valvular abnormalities. Both atria were described as normal in size.  ASSESSMENT AND PLAN:  Mrs. Caban appears to have "lone atrial fibrillation". Her embolic risk is elevated since she is over the age of 20 and is a woman. She is very reluctant to take full anticoagulation and her individual risk is relatively low since she does not have any other risk factors. Her bleeding complication risk is elevated since she takes nonsteroidal anti-inflammatory drugs for arthritis in her knees and fingers. After some discussion, we have agreed that she will remain on aspirin therapy alone and will not get start full anticoagulation. Obviously, even the mildest neurological event would swing the balance in favor of full anticoagulation.  We discussed the pros and cons of continued treatment with  rate control medications, antiarrhythmics therapy and even radiofrequency ablation. If she has only Atrial flutter, the latter would be an excellent option should the prevalence of arrhythmia increase. I wish the tracing at seems to show atrial fibrillation was more definitive. At this point antiarrhythmics therapy or invasive procedures such as ablation did not appear to be immediately necessary.   Current medicines are reviewed at length with the patient today.  The patient does not have concerns regarding medicines.  The following changes have been made:  no change  Labs/ tests ordered today include:  Orders Placed This Encounter  Procedures  . EKG 12-Lead   Patient Instructions  Your physician recommends that you schedule a follow-up appointment in: One Year.     Mikael Spray, MD  12/10/2014 2:05 PM    Sanda Klein, MD, [facility] [phone] office [phone] pager

## 2014-12-11 ENCOUNTER — Encounter: Payer: Self-pay | Admitting: Cardiovascular Disease

## 2015-02-18 DIAGNOSIS — H524 Presbyopia: Secondary | ICD-10-CM | POA: Diagnosis not present

## 2015-03-26 DIAGNOSIS — S139XXA Sprain of joints and ligaments of unspecified parts of neck, initial encounter: Secondary | ICD-10-CM | POA: Diagnosis not present

## 2015-03-26 DIAGNOSIS — M19041 Primary osteoarthritis, right hand: Secondary | ICD-10-CM | POA: Diagnosis not present

## 2015-03-31 DIAGNOSIS — M19049 Primary osteoarthritis, unspecified hand: Secondary | ICD-10-CM | POA: Diagnosis not present

## 2015-05-13 DIAGNOSIS — H40053 Ocular hypertension, bilateral: Secondary | ICD-10-CM | POA: Diagnosis not present

## 2015-07-18 DIAGNOSIS — M19041 Primary osteoarthritis, right hand: Secondary | ICD-10-CM | POA: Diagnosis not present

## 2015-07-18 DIAGNOSIS — M25562 Pain in left knee: Secondary | ICD-10-CM | POA: Diagnosis not present

## 2015-08-08 DIAGNOSIS — Z1231 Encounter for screening mammogram for malignant neoplasm of breast: Secondary | ICD-10-CM | POA: Diagnosis not present

## 2015-08-12 DIAGNOSIS — H40053 Ocular hypertension, bilateral: Secondary | ICD-10-CM | POA: Diagnosis not present

## 2015-08-14 ENCOUNTER — Ambulatory Visit: Payer: Medicare Other | Admitting: Nurse Practitioner

## 2015-09-24 DIAGNOSIS — M1712 Unilateral primary osteoarthritis, left knee: Secondary | ICD-10-CM | POA: Diagnosis not present

## 2015-10-01 DIAGNOSIS — M1712 Unilateral primary osteoarthritis, left knee: Secondary | ICD-10-CM | POA: Diagnosis not present

## 2015-10-07 DIAGNOSIS — I471 Supraventricular tachycardia: Secondary | ICD-10-CM | POA: Diagnosis not present

## 2015-10-07 DIAGNOSIS — M810 Age-related osteoporosis without current pathological fracture: Secondary | ICD-10-CM | POA: Diagnosis not present

## 2015-10-07 DIAGNOSIS — Z1389 Encounter for screening for other disorder: Secondary | ICD-10-CM | POA: Diagnosis not present

## 2015-10-07 DIAGNOSIS — Z0000 Encounter for general adult medical examination without abnormal findings: Secondary | ICD-10-CM | POA: Diagnosis not present

## 2015-10-07 DIAGNOSIS — E559 Vitamin D deficiency, unspecified: Secondary | ICD-10-CM | POA: Diagnosis not present

## 2015-10-08 DIAGNOSIS — M1712 Unilateral primary osteoarthritis, left knee: Secondary | ICD-10-CM | POA: Diagnosis not present

## 2015-10-14 DIAGNOSIS — E785 Hyperlipidemia, unspecified: Secondary | ICD-10-CM | POA: Diagnosis not present

## 2015-10-14 DIAGNOSIS — I471 Supraventricular tachycardia: Secondary | ICD-10-CM | POA: Diagnosis not present

## 2015-10-14 DIAGNOSIS — M810 Age-related osteoporosis without current pathological fracture: Secondary | ICD-10-CM | POA: Diagnosis not present

## 2015-10-14 DIAGNOSIS — J309 Allergic rhinitis, unspecified: Secondary | ICD-10-CM | POA: Diagnosis not present

## 2015-10-14 DIAGNOSIS — H6120 Impacted cerumen, unspecified ear: Secondary | ICD-10-CM | POA: Diagnosis not present

## 2015-11-11 DIAGNOSIS — H2513 Age-related nuclear cataract, bilateral: Secondary | ICD-10-CM | POA: Diagnosis not present

## 2015-12-04 DIAGNOSIS — M1712 Unilateral primary osteoarthritis, left knee: Secondary | ICD-10-CM | POA: Diagnosis not present

## 2016-01-02 ENCOUNTER — Encounter: Payer: Self-pay | Admitting: Cardiovascular Disease

## 2016-01-02 ENCOUNTER — Telehealth: Payer: Self-pay

## 2016-01-02 NOTE — Telephone Encounter (Signed)
Request for surgical clearance:   1. What type surgery is being performed? Left total knee arthroplasty  2. When is this surgery scheduled? undetermined  3. Are there any medications that need to be held prior to surgery and how long? ASA 81  4. Name of the physician performing surgery: Dr Dorna Leitz at [facility]  5. What is the office phone and fax number?   Phone [phone]  Fax [phone]

## 2016-01-09 ENCOUNTER — Other Ambulatory Visit: Payer: Self-pay | Admitting: Orthopedic Surgery

## 2016-01-12 ENCOUNTER — Ambulatory Visit (INDEPENDENT_AMBULATORY_CARE_PROVIDER_SITE_OTHER): Payer: Medicare Other | Admitting: Cardiovascular Disease

## 2016-01-12 VITALS — BP 132/74 | HR 71 | Ht 65.0 in | Wt 138.2 lb

## 2016-01-12 DIAGNOSIS — I4891 Unspecified atrial fibrillation: Secondary | ICD-10-CM

## 2016-01-12 NOTE — Patient Instructions (Signed)
Dr Croitoru recommends that you schedule a follow-up appointment in 6 months. You will receive a reminder letter in the mail two months in advance. If you don't receive a letter, please call our office to schedule the follow-up appointment.  If you need a refill on your cardiac medications before your next appointment, please call your pharmacy. 

## 2016-01-12 NOTE — Progress Notes (Signed)
Cardiology Office Note    Date:  01/12/2016   ID:  Angela Morales, DOB 12-Jul-1945, MRN [id number]  PCP:  Thressa Sheller, MD  Cardiologist:   Sanda Klein, MD   Chief Complaint  Patient presents with  . Follow-up    pt c/o rapid heart beat on friday lasted couple hrs    History of Present Illness:  Angela Morales is a 70 y.o. female with what appears to be "lone" atrial fibrillation with rapid ventricular response. Over the last year or so the frequency of symptomatic palpitations has increased to her it happens 2 or 3 times a month. She feels her heart beating both rapidly and irregularly. She does not feel particularly bad, but is a little fuzzy and not at "full capacity". She denies frank angina, dyspnea, presyncope. He has not had embolic neurological symptoms or bleeding problems. She has noticed that the events are sometimes associated with dehydration or use of caffeine. She is planning to undergo a total knee replacement in the near future.  Previous workup has demonstrated the absence of significant structural cardiac abnormalities in both the right and left atrium was described as being normal in size by echocardiography. There are no valvular problems.  Past Medical History  Diagnosis Date  . Abnormal uterine bleeding     on HRT  . SVT (supraventricular tachycardia)   . Arthritis   . DDD (degenerative disc disease)   . Allergy     seasonal  . Glaucoma   . Atrial fibrillation     Past Surgical History  Procedure Laterality Date  . Tonsillectomy and adenoidectomy    . Tubal ligation  1981  . Vaginal hysterectomy  04/20/05    prolapse, adenomyosis  . Appendectomy    . Foot fracture surgery  10/2013  . Colonoscopy      Current Medications: Outpatient Prescriptions Prior to Visit  Medication Sig Dispense Refill  . aspirin EC 81 MG EC tablet Take 1 tablet (81 mg total) by mouth daily.    . Cholecalciferol (VITAMIN D PO) Take 2,000 Int'l Units by mouth  daily.     . Glucosamine HCl (GLUCOSAMINE PO) Take 1 tablet by mouth daily.     Marland Kitchen LOTEMAX 0.5 % ophthalmic suspension Place 1 drop into both eyes daily.     . metoprolol tartrate (LOPRESSOR) 25 MG tablet Take 12.5 mg by mouth 2 (two) times daily. 1/2 bid    . naproxen sodium (ANAPROX) 220 MG tablet Take 220 mg by mouth. Takes 2-3 tabs daily    . Omega-3 Fatty Acids (FISH OIL PO) Take 1 capsule by mouth daily.     . psyllium (METAMUCIL) 58.6 % packet Take 1 packet by mouth daily.    . timolol (BETIMOL) 0.25 % ophthalmic solution Place 1 drop into both eyes 2 (two) times daily.     No facility-administered medications prior to visit.     Allergies:   Cefdinir   Social History   Social History  . Marital Status: Married    Spouse Name: N/A  . Number of Children: N/A  . Years of Education: N/A   Social History Main Topics  . Smoking status: Never Smoker   . Smokeless tobacco: Never Used  . Alcohol Use: 2.4 - 3.6 oz/week    4-6 Glasses of wine per week     Comment: occasional  . Drug Use: No  . Sexual Activity:    Partners: Male    Birth Control/ Protection: Surgical  Comment: TVH   Other Topics Concern  . Not on file   Social History Narrative     Family History:  The patient's family history includes Bipolar disorder in her son; Breast cancer in her cousin; Heart disease (age of onset: 44) in her brother; Liver disease in her son; Osteoarthritis in her maternal grandmother; Osteoporosis in her mother; Prostate cancer in her father; Stroke in her mother. There is no history of Colon cancer.   ROS:   Please see the history of present illness.    ROS All other systems reviewed and are negative.   PHYSICAL EXAM:   VS:  BP 132/74 mmHg  Pulse 71  Ht 5' 5"  (1.651 m)  Wt 62.687 kg (138 lb 3.2 oz)  BMI 23.00 kg/m2  LMP 04/20/2005   GEN: Well nourished, well developed, in no acute distress HEENT: normal Neck: no JVD, carotid bruits, or masses Cardiac: RRR; no murmurs,  rubs, or gallops,no edema  Respiratory:  clear to auscultation bilaterally, normal work of breathing GI: soft, nontender, nondistended, + BS MS: no deformity or atrophy Skin: warm and dry, no rash Neuro:  Alert and Oriented x 3, Strength and sensation are intact Psych: euthymic mood, full affect  Wt Readings from Last 3 Encounters:  01/12/16 62.687 kg (138 lb 3.2 oz)  12/09/14 60.601 kg (133 lb 9.6 oz)  10/25/14 60.782 kg (134 lb)      Studies/Labs Reviewed:   EKG:  EKG is ordered today.  The ekg ordered today demonstrates Normal sinus rhythm, normal QT interval     ASSESSMENT:    1. Atrial fibrillation with RVR (HCC)      PLAN:  In order of problems listed above:  1. We discussed options for treatment with either antiarrhythmic drugs or radiofrequency ablation. I recommended referral to an electrophysiologist since I think should be a good candidate for ablation. She would like to resolve her knee replacement issues first. We once again reviewed the risks/benefit ratio of anticoagulation for her condition. Technically, due to her age of 83 and female gender she qualifies for full anticoagulation therapy, but the number needed to benefit from treatment with a direct oral anticoagulant (1 and 60) is identical with the number needed to harm via bleeding complications. The issues compounded by the fact that she is unable to walk unless he takes naproxen on a daily basis due to her knee pain She prefers to continue taking aspirin rather than begin anticoagulant and I think at least for the short-term this is a reasonable solution. I wonder whether we might even consider a watchman device. At her request, will defer these decisions until after her knee surgery. I think she is at low risk for major cardiovascular complications with any orthopedic procedure.    Medication Adjustments/Labs and Tests Ordered: Current medicines are reviewed at length with the patient today.  Concerns  regarding medicines are outlined above.  Medication changes, Labs and Tests ordered today are listed in the Patient Instructions below. There are no Patient Instructions on file for this visit.   Signed, Sanda Klein, MD  01/12/2016 9:23 AM    [address], [place], [place]  [address] Phone: [phone]; Fax: [phone]

## 2016-01-14 ENCOUNTER — Encounter: Payer: Self-pay | Admitting: Cardiovascular Disease

## 2016-01-19 NOTE — Telephone Encounter (Signed)
Faxed clearance letter to Lacretia Nicks at [facility].

## 2016-01-27 DIAGNOSIS — M1712 Unilateral primary osteoarthritis, left knee: Secondary | ICD-10-CM | POA: Diagnosis not present

## 2016-01-28 NOTE — Anesthesia Preprocedure Evaluation (Addendum)
Anesthesia Evaluation  Patient identified by MRN, date of birth, ID band Patient awake    Reviewed: Allergy & Precautions, H&P , NPO status , Patient's Chart, lab work & pertinent test results, reviewed documented beta blocker date and time   History of Anesthesia Complications (+) PONV and history of anesthetic complications  Airway Mallampati: II  TM Distance: >3 FB Neck ROM: Full    Dental  (+) Teeth Intact, Dental Advisory Given   Pulmonary neg pulmonary ROS,    Pulmonary exam normal breath sounds clear to auscultation       Cardiovascular (-) hypertension(-) angina(-) Past MI, (-) Cardiac Stents and (-) Orthopnea + dysrhythmias Atrial Fibrillation and Supra Ventricular Tachycardia  Rhythm:Regular Rate:Normal  01/12/16 EKG: NSR.  12/01/13 Echo: Study Conclusions - Left ventricle: The cavity size was normal. Wall thickness was normal. Systolic function was normal. The estimated ejection fraction was in the range of 55% to 60%. Wall motion was normal; there were no regional wall motion abnormalities. Left ventricular diastolic function parameters were normal. - Mitral valve: Mildly thickened leaflets . There was mild regurgitation. - Left atrium: The atrium was normal in size. - Right atrium: The atrium was normal in size. - Atrial septum: No defect or patent foramen ovale was identified. Impressions: - LVEF 55-60%, normal wall thickness, normal biatrial sizes, thickened mitral leaflets with mild MR. Normal diastolic function.  03/08/16 CXR: IMPRESSION: Suspect mild emphysematous changes. No acute pulmonary findings.   Neuro/Psych neg Seizures DDD    GI/Hepatic negative GI ROS, Neg liver ROS,   Endo/Other  negative endocrine ROS  Renal/GU negative Renal ROS     Musculoskeletal  (+) Arthritis ,   Abdominal   Peds  Hematology negative hematology ROS (+)   Anesthesia Other Findings LVEF  55-60%, normal wall thickness, normal biatrial sizes, thickened mitral leaflets with mild MR. Normal diastolic function.  Reproductive/Obstetrics                           Anesthesia Physical Anesthesia Plan  ASA: III  Anesthesia Plan: Spinal and Regional   Post-op Pain Management:    Induction: Intravenous  Airway Management Planned: Natural Airway and Simple Face Mask  Additional Equipment:   Intra-op Plan:   Post-operative Plan:   Informed Consent: I have reviewed the patients History and Physical, chart, labs and discussed the procedure including the risks, benefits and alternatives for the proposed anesthesia with the patient or authorized representative who has indicated his/her understanding and acceptance.   Dental advisory given and Dental Advisory Given  Plan Discussed with: CRNA  Anesthesia Plan Comments: (Per Joni Reining at Dr. Luiz Blare' office: He requests spinal anesthesia with adductor canal block. Shonna Chock, PA-C  I have discussed risks of neuraxial anesthesia including but not limited to infection, bleeding, nerve injury, back pain, headache, seizures, and failure of block. Patient denies bleeding disorders and is not currently anticoagulated. Labs have been reviewed. Risks and benefits discussed. All patient's questions answered.   Discussed potential risks of nerve blocks including, but not limited to, infection, bleeding, nerve damage, seizures, pneumothorax, respiratory depression, and potential failure of the block. Alternatives to nerve blocks discussed. All questions answered.  Platelets 315 INR 0.99)     Anesthesia Quick Evaluation

## 2016-02-18 DIAGNOSIS — H40013 Open angle with borderline findings, low risk, bilateral: Secondary | ICD-10-CM | POA: Diagnosis not present

## 2016-02-18 DIAGNOSIS — H5213 Myopia, bilateral: Secondary | ICD-10-CM | POA: Diagnosis not present

## 2016-03-06 ENCOUNTER — Encounter (HOSPITAL_COMMUNITY): Payer: Self-pay

## 2016-03-06 NOTE — Pre-Procedure Instructions (Signed)
Angela Morales  03/06/2016     Your procedure is scheduled on September 25.  Report to [facility] Admitting at 10:15 A.M.  Call this number if you have problems the morning of surgery:  [phone]   Remember:  Do not eat food or drink liquids after midnight.  Take only these medicines the morning of surgery with A SIP OF WATER: Claritin, Lotemax, Metoprolol, timolol   STOP Aspirin, Vitamin D, Glucosamine, Naproxen September 18   STOP/ Do not take Aspirin, Aleve, Naproxen, Advil, Ibuprofen, Motrin, Vitamins, Herbs, or Supplements starting September 18   Do not wear jewelry, make-up or nail polish.  Do not wear lotions, powders, or perfumes, or deoderant.  Do not shave 48 hours prior to surgery.  Men may shave face and neck.  Do not bring valuables to the hospital.  [facility] is not responsible for any belongings or valuables.  Contacts, dentures or bridgework may not be worn into surgery.  Leave your suitcase in the car.  After surgery it may be brought to your room.  For patients admitted to the hospital, discharge time will be determined by your treatment team.  [facility] - Preparing for Surgery  Before surgery, you can play an important role.  Because skin is not sterile, your skin needs to be as free of germs as possible.  You can reduce the number of germs on you skin by washing with CHG (chlorahexidine gluconate) soap before surgery.  CHG is an antiseptic cleaner which kills germs and bonds with the skin to continue killing germs even after washing.  Please DO NOT use if you have an allergy to CHG or antibacterial soaps.  If your skin becomes reddened/irritated stop using the CHG and inform your nurse when you arrive at Short Stay.  Do not shave (including legs and underarms) for at least 48 hours prior to the first CHG shower.  You may shave your face.  Please follow these instructions carefully:   1.  Shower with CHG Soap the night before surgery and  the morning of Surgery.  2.  If you choose to wash your hair, wash your hair first as usual with your normal shampoo.  3.  After you shampoo, rinse your hair and body thoroughly to remove the shampoo.  4.  Use CHG as you would any other liquid soap.  You can apply CHG directly to the skin and wash gently with scrungie or a clean washcloth.  5.  Apply the CHG Soap to your body ONLY FROM THE NECK DOWN.  Do not use on open wounds or open sores.  Avoid contact with your eyes, ears, mouth and genitals (private parts).  Wash genitals (private parts) with your normal soap.  6.  Wash thoroughly, paying special attention to the area where your surgery will be performed.  7.  Thoroughly rinse your body with warm water from the neck down.  8.  DO NOT shower/wash with your normal soap after using and rinsing off the CHG Soap.  9.  Pat yourself dry with a clean towel.            10.  Wear clean pajamas.            11.  Place clean sheets on your bed the night of your first shower and do not sleep with pets.  Day of Surgery  Do not apply any lotions the morning of surgery.  Please wear clean clothes to the hospital/surgery  center.

## 2016-03-08 ENCOUNTER — Encounter (HOSPITAL_COMMUNITY): Payer: Self-pay

## 2016-03-08 ENCOUNTER — Ambulatory Visit (HOSPITAL_COMMUNITY)
Admission: RE | Admit: 2016-03-08 | Discharge: 2016-03-08 | Disposition: A | Discharge status: Home / Self Care | Payer: Medicare Other | Source: Ambulatory Visit | Attending: Orthopedic Surgery | Admitting: Orthopedic Surgery

## 2016-03-08 ENCOUNTER — Encounter (HOSPITAL_COMMUNITY)
Admission: RE | Admit: 2016-03-08 | Discharge: 2016-03-08 | Disposition: A | Discharge status: Home / Self Care | Payer: Medicare Other | Source: Ambulatory Visit | Attending: Orthopedic Surgery | Admitting: Orthopedic Surgery

## 2016-03-08 DIAGNOSIS — Z01818 Encounter for other preprocedural examination: Secondary | ICD-10-CM | POA: Diagnosis not present

## 2016-03-08 DIAGNOSIS — R918 Other nonspecific abnormal finding of lung field: Secondary | ICD-10-CM | POA: Diagnosis not present

## 2016-03-08 HISTORY — DX: Other specified postprocedural states: Z98.890

## 2016-03-08 HISTORY — DX: Nausea with vomiting, unspecified: R11.2

## 2016-03-08 HISTORY — DX: Cardiac arrhythmia, unspecified: I49.9

## 2016-03-08 LAB — PROTIME-INR
INR: 0.99
PROTHROMBIN TIME: 13.1 s (ref 11.4–15.2)

## 2016-03-08 LAB — TYPE AND SCREEN
ABO/RH(D): O POS
Antibody Screen: NEGATIVE

## 2016-03-08 LAB — APTT: APTT: 28 s (ref 24–36)

## 2016-03-08 LAB — CBC WITH DIFFERENTIAL/PLATELET
BASOS ABS: 0.1 10*3/uL (ref 0.0–0.1)
Basophils Relative: 1 %
Eosinophils Absolute: 0.2 10*3/uL (ref 0.0–0.7)
Eosinophils Relative: 3 %
HEMATOCRIT: 40 % (ref 36.0–46.0)
HEMOGLOBIN: 12.4 g/dL (ref 12.0–15.0)
LYMPHS PCT: 33 %
Lymphs Abs: 2.5 10*3/uL (ref 0.7–4.0)
MCH: 28.6 pg (ref 26.0–34.0)
MCHC: 31 g/dL (ref 30.0–36.0)
MCV: 92.2 fL (ref 78.0–100.0)
MONO ABS: 1 10*3/uL (ref 0.1–1.0)
Monocytes Relative: 13 %
NEUTROS ABS: 3.7 10*3/uL (ref 1.7–7.7)
Neutrophils Relative %: 50 %
Platelets: 315 10*3/uL (ref 150–400)
RBC: 4.34 MIL/uL (ref 3.87–5.11)
RDW: 15.8 % — ABNORMAL HIGH (ref 11.5–15.5)
WBC: 7.4 10*3/uL (ref 4.0–10.5)

## 2016-03-08 LAB — URINALYSIS, ROUTINE W REFLEX MICROSCOPIC
Bilirubin Urine: NEGATIVE
Glucose, UA: NEGATIVE mg/dL
Hgb urine dipstick: NEGATIVE
Ketones, ur: NEGATIVE mg/dL
LEUKOCYTES UA: NEGATIVE
NITRITE: NEGATIVE
PH: 5.5 (ref 5.0–8.0)
Protein, ur: NEGATIVE mg/dL

## 2016-03-08 LAB — SURGICAL PCR SCREEN
MRSA, PCR: NEGATIVE
STAPHYLOCOCCUS AUREUS: POSITIVE — AB

## 2016-03-08 LAB — COMPREHENSIVE METABOLIC PANEL
ALK PHOS: 76 U/L (ref 38–126)
ALT: 26 U/L (ref 14–54)
AST: 35 U/L (ref 15–41)
Albumin: 3.7 g/dL (ref 3.5–5.0)
Anion gap: 7 (ref 5–15)
BILIRUBIN TOTAL: 0.3 mg/dL (ref 0.3–1.2)
BUN: 17 mg/dL (ref 6–20)
CO2: 29 mmol/L (ref 22–32)
CREATININE: 0.5 mg/dL (ref 0.44–1.00)
Calcium: 9.4 mg/dL (ref 8.9–10.3)
Chloride: 103 mmol/L (ref 101–111)
GFR calc Af Amer: >60 mL/min (ref >60)
Glucose, Bld: 90 mg/dL (ref 65–99)
Potassium: 5 mmol/L (ref 3.5–5.1)
Sodium: 139 mmol/L (ref 135–145)
TOTAL PROTEIN: 6.7 g/dL (ref 6.5–8.1)

## 2016-03-08 LAB — ABO/RH: ABO/RH(D): O POS

## 2016-03-16 NOTE — Progress Notes (Signed)
Anesthesia chart review: Patient is a 69 year old female scheduled for left TKA on 03/22/2016 by Dr. Luiz Blare. Anesthesia type is posted for choice. However, last month, I received a phone call from [facility] at Dr. Luiz Blare' office requesting spinal anesthesia with adductor canal block.  History includes never smoker, postoperative nausea and vomiting, SVT/PAF, arthritis, glaucoma, T&A, hysterectomy, appendectomy.  PCP is Dr. Thayer Headings. Cardiologist is Dr. Ernest Pine, last visit 01/12/16. He wrote on 01/12/16:  1. We discussed options for treatment with either antiarrhythmic drugs or radiofrequency ablation. I recommended referral to an electrophysiologist since I think should be a good candidate for ablation. She would like to resolve her knee replacement issues first. We once again reviewed the risks/benefit ratio of anticoagulation for her condition. Technically, due to her age of 3 and female gender she qualifies for full anticoagulation therapy, but the number needed to benefit from treatment with a direct oral anticoagulant (1 and 31) is identical with the number needed to harm via bleeding complications. The issues compounded by the fact that she is unable to walk unless he takes naproxen on a daily basis due to her knee pain She prefers to continue taking aspirin rather than begin anticoagulant and I think at least for the short-term this is a reasonable solution. I wonder whether we might even consider a watchman device. At her request, will defer these decisions until after her knee surgery. I think she is at low risk for major cardiovascular complications with any orthopedic procedure.  Meds include aspirin 81 mg, Claritin, Lotemax ophthalmic, Lopressor, Betimol ophthalmic  BP (!) 130/58   Pulse 69   Temp 36.6 C   Resp 18   Ht 5\' 5"  (1.651 m)   Wt 139 lb 1.6 oz (63.1 kg)   LMP 04/20/2005   SpO2 100%   BMI 23.15 kg/m    01/12/16 EKG: NSR.  12/01/13 Echo: Study Conclusions - Left  ventricle: The cavity size was normal. Wall thickness was normal. Systolic function was normal. The estimated ejection fraction was in the range of 55% to 60%. Wall motion was normal; there were no regional wall motion abnormalities. Left ventricular diastolic function parameters were normal. - Mitral valve: Mildly thickened leaflets . There was mild regurgitation. - Left atrium: The atrium was normal in size. - Right atrium: The atrium was normal in size. - Atrial septum: No defect or patent foramen ovale was identified. Impressions: - LVEF 55-60%, normal wall thickness, normal biatrial sizes, thickened mitral leaflets with mild MR. Normal diastolic function.  03/08/16 CXR: IMPRESSION: Suspect mild emphysematous changes. No acute pulmonary findings.  Preoperative labs noted.   Definitive anesthesia plan following evaluation on the day of surgery.  05/08/16 [facility]/Anesthesiology Phone [phone] 03/16/2016 12:57 PM

## 2016-03-21 NOTE — H&P (Addendum)
TOTAL KNEE ADMISSION H&P  Patient is being admitted for left total knee arthroplasty.  Subjective:  Chief Complaint:left knee pain.  HPI: Angela Morales, 70 y.o. female, has a history of pain and functional disability in the left knee due to arthritis and has failed non-surgical conservative treatments for greater than 12 weeks to includeNSAID's and/or analgesics, corticosteriod injections, viscosupplementation injections and flexibility and strengthening excercises.  Onset of symptoms was gradual, starting 5 years ago with gradually worsening course since that time. The patient noted no past surgery on the left knee(s).  Patient currently rates pain in the left knee(s) at 9 out of 10 with activity. Patient has night pain, worsening of pain with activity and weight bearing, pain that interferes with activities of daily living, pain with passive range of motion and joint swelling.  Patient has evidence of subchondral sclerosis, periarticular osteophytes and joint space narrowing by imaging studies. This patient has had failure of all reasonable conservative care. There is no active infection.  Patient Active Problem List   Diagnosis Date Noted  . Atrial fibrillation with RVR ([place]) 12/01/2013  . A-fib ([place]) 11/30/2013  . Hypotension 11/30/2013  . Atrial flutter ([place]) 11/30/2013   Past Medical History:  Diagnosis Date  . Abnormal uterine bleeding    on HRT  . Allergy    seasonal  . Arthritis   . Atrial fibrillation ([place])   . DDD (degenerative disc disease)   . Dysrhythmia   . Glaucoma   . PONV (postoperative nausea and vomiting)   . SVT (supraventricular tachycardia) (HCC)     Past Surgical History:  Procedure Laterality Date  . APPENDECTOMY    . COLONOSCOPY    . FOOT FRACTURE SURGERY  10/2013  . TONSILLECTOMY AND ADENOIDECTOMY    . TUBAL LIGATION  1981  . VAGINAL HYSTERECTOMY  04/20/05   prolapse, adenomyosis    No prescriptions prior to admission.   Allergies  Allergen  Reactions  . Cefdinir Rash    Social History  Substance Use Topics  . Smoking status: Never Smoker  . Smokeless tobacco: Never Used  . Alcohol use 2.4 - 3.6 oz/week    4 - 6 Glasses of wine per week     Comment: occasional    Family History  Problem Relation Age of Onset  . Osteoporosis Mother   . Stroke Mother     mini  . Prostate cancer Father   . Heart disease Brother 69    shunt put in heart  . Osteoarthritis Maternal Grandmother   . Bipolar disorder Son   . Liver disease Son   . Breast cancer Cousin     1st cousin  . Colon cancer Neg Hx      ROS ROS: I have reviewed the patient's review of systems thoroughly and there are no positive responses as relates to the HPI. Objective:  Physical Exam  Vital signs in last 24 hours:   There were no vitals filed for this visit. Well-developed well-nourished patient in no acute distress. Alert and oriented x3 HEENT:within normal limits Cardiac: Regular rate and rhythm Pulmonary: Lungs clear to auscultation Abdomen: Soft and nontender.  Normal active bowel sounds  Musculoskeletal: (left knee: Painful range of motion.  Limited range of motion.  No instability.  Mild valgus malalignment.) Labs: Recent Results (from the past 2160 hour(s))  Surgical pcr screen     Status: Abnormal   Collection Time: 03/08/16  9:54 AM  Result Value Ref Range   MRSA, PCR NEGATIVE  NEGATIVE   Staphylococcus aureus POSITIVE (A) NEGATIVE    Comment:        The Xpert SA Assay (FDA approved for NASAL specimens in patients over 65 years of age), is one component of a comprehensive surveillance program.  Test performance has been validated by [facility] for patients greater than or equal to 46 year old. It is not intended to diagnose infection nor to guide or monitor treatment.   Urinalysis, Routine w reflex microscopic (not at [facility])     Status: Abnormal   Collection Time: 03/08/16  9:55 AM  Result Value Ref Range   Color, Urine YELLOW  YELLOW   APPearance CLEAR CLEAR   Specific Gravity, Urine <1.005 (L) 1.005 - 1.030   pH 5.5 5.0 - 8.0   Glucose, UA NEGATIVE NEGATIVE mg/dL   Hgb urine dipstick NEGATIVE NEGATIVE   Bilirubin Urine NEGATIVE NEGATIVE   Ketones, ur NEGATIVE NEGATIVE mg/dL   Protein, ur NEGATIVE NEGATIVE mg/dL   Nitrite NEGATIVE NEGATIVE   Leukocytes, UA NEGATIVE NEGATIVE    Comment: MICROSCOPIC NOT DONE ON URINES WITH NEGATIVE PROTEIN, BLOOD, LEUKOCYTES, NITRITE, OR GLUCOSE <1000 mg/dL.  Type and screen Order type and screen if day of surgery is less than 15 days from draw of preadmission visit or order morning of surgery if day of surgery is greater than 6 days from preadmission visit.     Status: None   Collection Time: 03/08/16 10:00 AM  Result Value Ref Range   ABO/RH(D) O POS    Antibody Screen NEG    Sample Expiration 03/22/2016    Extend sample reason NO TRANSFUSIONS OR PREGNANCY IN THE PAST 3 MONTHS   ABO/Rh     Status: None   Collection Time: 03/08/16 10:00 AM  Result Value Ref Range   ABO/RH(D) O POS   APTT     Status: None   Collection Time: 03/08/16 10:02 AM  Result Value Ref Range   aPTT 28 24 - 36 seconds  CBC WITH DIFFERENTIAL     Status: Abnormal   Collection Time: 03/08/16 10:02 AM  Result Value Ref Range   WBC 7.4 4.0 - 10.5 K/uL   RBC 4.34 3.87 - 5.11 MIL/uL   Hemoglobin 12.4 12.0 - 15.0 g/dL   HCT 40.0 36.0 - 46.0 %   MCV 92.2 78.0 - 100.0 fL   MCH 28.6 26.0 - 34.0 pg   MCHC 31.0 30.0 - 36.0 g/dL   RDW 15.8 (H) 11.5 - 15.5 %   Platelets 315 150 - 400 K/uL   Neutrophils Relative % 50 %   Neutro Abs 3.7 1.7 - 7.7 K/uL   Lymphocytes Relative 33 %   Lymphs Abs 2.5 0.7 - 4.0 K/uL   Monocytes Relative 13 %   Monocytes Absolute 1.0 0.1 - 1.0 K/uL   Eosinophils Relative 3 %   Eosinophils Absolute 0.2 0.0 - 0.7 K/uL   Basophils Relative 1 %   Basophils Absolute 0.1 0.0 - 0.1 K/uL  Comprehensive metabolic panel     Status: None   Collection Time: 03/08/16 10:02 AM  Result  Value Ref Range   Sodium 139 135 - 145 mmol/L   Potassium 5.0 3.5 - 5.1 mmol/L   Chloride 103 101 - 111 mmol/L   CO2 29 22 - 32 mmol/L   Glucose, Bld 90 65 - 99 mg/dL   BUN 17 6 - 20 mg/dL   Creatinine, Ser 0.50 0.44 - 1.00 mg/dL   Calcium 9.4 8.9 - 10.3  mg/dL   Total Protein 6.7 6.5 - 8.1 g/dL   Albumin 3.7 3.5 - 5.0 g/dL   AST 35 15 - 41 U/L   ALT 26 14 - 54 U/L   Alkaline Phosphatase 76 38 - 126 U/L   Total Bilirubin 0.3 0.3 - 1.2 mg/dL   GFR calc non Af Amer >60 >60 mL/min   GFR calc Af Amer >60 >60 mL/min    Comment: (NOTE) The eGFR has been calculated using the CKD EPI equation. This calculation has not been validated in all clinical situations. eGFR's persistently <60 mL/min signify possible Chronic Kidney Disease.    Anion gap 7 5 - 15  Protime-INR     Status: None   Collection Time: 03/08/16 10:02 AM  Result Value Ref Range   Prothrombin Time 13.1 11.4 - 15.2 seconds   INR 0.99     Estimated body mass index is 23.15 kg/m as calculated from the following:   Height as of 03/08/16: 5' 5"  (1.651 m).   Weight as of 03/08/16: 63.1 kg (139 lb 1.6 oz).   Imaging Review Plain radiographs demonstrate severe degenerative joint disease of the left knee(s). The overall alignment ismild valgus. The bone quality appears to be fair for age and reported activity level.  Assessment/Plan:  End stage arthritis, left knee   The patient history, physical examination, clinical judgment of the provider and imaging studies are consistent with end stage degenerative joint disease of the left knee(s) and total knee arthroplasty is deemed medically necessary. The treatment options including medical management, injection therapy arthroscopy and arthroplasty were discussed at length. The risks and benefits of total knee arthroplasty were presented and reviewed. The risks due to aseptic loosening, infection, stiffness, patella tracking problems, thromboembolic complications and other  imponderables were discussed. The patient acknowledged the explanation, agreed to proceed with the plan and consent was signed. Patient is being admitted for inpatient treatment for surgery, pain control, PT, OT, prophylactic antibiotics, VTE prophylaxis, progressive ambulation and ADL's and discharge planning. The patient is planning to be discharged home with home health services

## 2016-03-22 ENCOUNTER — Inpatient Hospital Stay (HOSPITAL_COMMUNITY)
Admission: RE | Admit: 2016-03-22 | Discharge: 2016-03-24 | DRG: 470 | Disposition: A | Discharge status: Home Health | Payer: Medicare Other | Source: Ambulatory Visit | Attending: Orthopedic Surgery | Admitting: Orthopedic Surgery

## 2016-03-22 ENCOUNTER — Encounter (HOSPITAL_COMMUNITY)
Admission: RE | Disposition: A | Discharge status: Home Health | Payer: Self-pay | Source: Ambulatory Visit | Attending: Orthopedic Surgery

## 2016-03-22 ENCOUNTER — Encounter (HOSPITAL_COMMUNITY): Payer: Self-pay | Admitting: *Deleted

## 2016-03-22 ENCOUNTER — Inpatient Hospital Stay (HOSPITAL_COMMUNITY): Payer: Medicare Other | Admitting: Vascular Surgery

## 2016-03-22 DIAGNOSIS — Z823 Family history of stroke: Secondary | ICD-10-CM

## 2016-03-22 DIAGNOSIS — M179 Osteoarthritis of knee, unspecified: Secondary | ICD-10-CM | POA: Diagnosis not present

## 2016-03-22 DIAGNOSIS — Z803 Family history of malignant neoplasm of breast: Secondary | ICD-10-CM | POA: Diagnosis not present

## 2016-03-22 DIAGNOSIS — G8918 Other acute postprocedural pain: Secondary | ICD-10-CM | POA: Diagnosis not present

## 2016-03-22 DIAGNOSIS — Z96652 Presence of left artificial knee joint: Secondary | ICD-10-CM | POA: Diagnosis not present

## 2016-03-22 DIAGNOSIS — Z7982 Long term (current) use of aspirin: Secondary | ICD-10-CM | POA: Diagnosis not present

## 2016-03-22 DIAGNOSIS — H409 Unspecified glaucoma: Secondary | ICD-10-CM | POA: Diagnosis not present

## 2016-03-22 DIAGNOSIS — I4891 Unspecified atrial fibrillation: Secondary | ICD-10-CM | POA: Diagnosis present

## 2016-03-22 DIAGNOSIS — Z471 Aftercare following joint replacement surgery: Secondary | ICD-10-CM | POA: Diagnosis not present

## 2016-03-22 DIAGNOSIS — M25562 Pain in left knee: Secondary | ICD-10-CM | POA: Diagnosis present

## 2016-03-22 DIAGNOSIS — M1712 Unilateral primary osteoarthritis, left knee: Secondary | ICD-10-CM | POA: Diagnosis not present

## 2016-03-22 HISTORY — PX: TOTAL KNEE ARTHROPLASTY: SHX125

## 2016-03-22 SURGERY — ARTHROPLASTY, KNEE, TOTAL
Anesthesia: Regional | Site: Knee | Laterality: Left

## 2016-03-22 MED ORDER — ACETAMINOPHEN 325 MG PO TABS
650.0000 mg | ORAL_TABLET | Freq: Four times a day (QID) | ORAL | Status: DC | PRN
  Administered 2016-03-24: 650 mg via ORAL
  Filled 2016-03-22: qty 2

## 2016-03-22 MED ORDER — HYDROMORPHONE HCL 1 MG/ML IJ SOLN: 0.5000 mg | INTRAMUSCULAR | Status: DC | PRN

## 2016-03-22 MED ORDER — ONDANSETRON HCL 4 MG/2ML IJ SOLN
INTRAMUSCULAR | Status: AC
  Filled 2016-03-22: qty 2

## 2016-03-22 MED ORDER — LOTEPREDNOL ETABONATE 0.5 % OP SUSP
1.0000 [drp] | Freq: Every day | OPHTHALMIC | Status: DC
  Administered 2016-03-23 – 2016-03-24 (×2): 1 [drp] via OPHTHALMIC
  Filled 2016-03-22: qty 5

## 2016-03-22 MED ORDER — METOPROLOL TARTRATE 12.5 MG HALF TABLET
12.5000 mg | ORAL_TABLET | Freq: Two times a day (BID) | ORAL | Status: DC
  Administered 2016-03-22 – 2016-03-24 (×4): 12.5 mg via ORAL
  Filled 2016-03-22 (×4): qty 1

## 2016-03-22 MED ORDER — CLINDAMYCIN PHOSPHATE 900 MG/50ML IV SOLN
900.0000 mg | INTRAVENOUS | Status: AC
  Administered 2016-03-22: 900 mg via INTRAVENOUS
  Filled 2016-03-22: qty 50

## 2016-03-22 MED ORDER — METHOCARBAMOL 1000 MG/10ML IJ SOLN
500.0000 mg | Freq: Four times a day (QID) | INTRAVENOUS | Status: DC | PRN
  Filled 2016-03-22: qty 5

## 2016-03-22 MED ORDER — BUPIVACAINE LIPOSOME 1.3 % IJ SUSP
20.0000 mL | Freq: Once | INTRAMUSCULAR | Status: AC
  Administered 2016-03-22: 20 mL
  Filled 2016-03-22: qty 20

## 2016-03-22 MED ORDER — DIPHENHYDRAMINE HCL 12.5 MG/5ML PO ELIX: 12.5000 mg | ORAL_SOLUTION | ORAL | Status: DC | PRN

## 2016-03-22 MED ORDER — PROPOFOL 500 MG/50ML IV EMUL
INTRAVENOUS | Status: DC | PRN
  Administered 2016-03-22: 75 ug/kg/min via INTRAVENOUS

## 2016-03-22 MED ORDER — ALUM & MAG HYDROXIDE-SIMETH 200-200-20 MG/5ML PO SUSP: 30.0000 mL | ORAL | Status: DC | PRN

## 2016-03-22 MED ORDER — MIDAZOLAM HCL 2 MG/2ML IJ SOLN
INTRAMUSCULAR | Status: AC
  Filled 2016-03-22: qty 2

## 2016-03-22 MED ORDER — FENTANYL CITRATE (PF) 100 MCG/2ML IJ SOLN
INTRAMUSCULAR | Status: AC
  Filled 2016-03-22: qty 2

## 2016-03-22 MED ORDER — TRANEXAMIC ACID 1000 MG/10ML IV SOLN
1000.0000 mg | Freq: Once | INTRAVENOUS | Status: DC
  Filled 2016-03-22: qty 10

## 2016-03-22 MED ORDER — TIMOLOL HEMIHYDRATE 0.25 % OP SOLN: 1.0000 [drp] | Freq: Two times a day (BID) | OPHTHALMIC | Status: DC

## 2016-03-22 MED ORDER — BUPIVACAINE IN DEXTROSE 0.75-8.25 % IT SOLN
INTRATHECAL | Status: DC | PRN
  Administered 2016-03-22: 1.8 mL via INTRATHECAL

## 2016-03-22 MED ORDER — LIDOCAINE 2% (20 MG/ML) 5 ML SYRINGE
INTRAMUSCULAR | Status: AC
  Filled 2016-03-22: qty 5

## 2016-03-22 MED ORDER — ASPIRIN EC 325 MG PO TBEC: 325.0000 mg | DELAYED_RELEASE_TABLET | Freq: Two times a day (BID) | ORAL | 0 refills | Status: DC

## 2016-03-22 MED ORDER — LACTATED RINGERS IV SOLN
INTRAVENOUS | Status: DC | PRN
  Administered 2016-03-22 (×2): via INTRAVENOUS

## 2016-03-22 MED ORDER — MAGNESIUM CITRATE PO SOLN
1.0000 | Freq: Once | ORAL | Status: DC | PRN
Start: 2016-03-22 — End: 2016-03-24

## 2016-03-22 MED ORDER — BUPIVACAINE HCL (PF) 0.5 % IJ SOLN
INTRAMUSCULAR | Status: AC
  Filled 2016-03-22: qty 30

## 2016-03-22 MED ORDER — ACETAMINOPHEN 650 MG RE SUPP: 650.0000 mg | Freq: Four times a day (QID) | RECTAL | Status: DC | PRN

## 2016-03-22 MED ORDER — CLINDAMYCIN PHOSPHATE 600 MG/50ML IV SOLN
INTRAVENOUS | Status: AC
  Administered 2016-03-22: 600 mg via INTRAVENOUS
  Filled 2016-03-22: qty 50

## 2016-03-22 MED ORDER — BUPIVACAINE HCL (PF) 0.5 % IJ SOLN
INTRAMUSCULAR | Status: DC | PRN
Start: 2016-03-22 — End: 2016-03-22
  Administered 2016-03-22: 20 mL

## 2016-03-22 MED ORDER — CLINDAMYCIN PHOSPHATE 600 MG/50ML IV SOLN
600.0000 mg | Freq: Four times a day (QID) | INTRAVENOUS | Status: AC
  Administered 2016-03-22 (×2): 600 mg via INTRAVENOUS
  Filled 2016-03-22: qty 50

## 2016-03-22 MED ORDER — DOCUSATE SODIUM 100 MG PO CAPS
100.0000 mg | ORAL_CAPSULE | Freq: Two times a day (BID) | ORAL | Status: DC
  Administered 2016-03-23 – 2016-03-24 (×3): 100 mg via ORAL
  Filled 2016-03-22 (×4): qty 1

## 2016-03-22 MED ORDER — ONDANSETRON HCL 4 MG/2ML IJ SOLN
4.0000 mg | Freq: Four times a day (QID) | INTRAMUSCULAR | Status: DC | PRN
  Administered 2016-03-22: 4 mg via INTRAVENOUS
  Filled 2016-03-22: qty 2

## 2016-03-22 MED ORDER — PHENYLEPHRINE HCL 10 MG/ML IJ SOLN
INTRAMUSCULAR | Status: DC | PRN
  Administered 2016-03-22 (×3): 80 ug via INTRAVENOUS

## 2016-03-22 MED ORDER — FENTANYL CITRATE (PF) 100 MCG/2ML IJ SOLN
25.0000 ug | INTRAMUSCULAR | Status: DC | PRN
  Administered 2016-03-22: 25 ug via INTRAVENOUS
  Administered 2016-03-22: 50 ug via INTRAVENOUS

## 2016-03-22 MED ORDER — CELECOXIB 200 MG PO CAPS
200.0000 mg | ORAL_CAPSULE | Freq: Two times a day (BID) | ORAL | Status: DC
  Administered 2016-03-23 – 2016-03-24 (×3): 200 mg via ORAL
  Filled 2016-03-22 (×4): qty 1

## 2016-03-22 MED ORDER — CHLORHEXIDINE GLUCONATE 4 % EX LIQD: 60.0000 mL | Freq: Once | CUTANEOUS | Status: DC

## 2016-03-22 MED ORDER — PROPOFOL 10 MG/ML IV BOLUS
INTRAVENOUS | Status: AC
  Filled 2016-03-22: qty 20

## 2016-03-22 MED ORDER — SODIUM CHLORIDE 0.9 % IV SOLN
INTRAVENOUS | Status: DC
  Administered 2016-03-22 – 2016-03-23 (×2): via INTRAVENOUS

## 2016-03-22 MED ORDER — FENTANYL CITRATE (PF) 100 MCG/2ML IJ SOLN
100.0000 ug | Freq: Once | INTRAMUSCULAR | Status: AC
  Administered 2016-03-22: 50 ug via INTRAVENOUS

## 2016-03-22 MED ORDER — BISACODYL 5 MG PO TBEC: 5.0000 mg | DELAYED_RELEASE_TABLET | Freq: Every day | ORAL | Status: DC | PRN

## 2016-03-22 MED ORDER — ZOLPIDEM TARTRATE 5 MG PO TABS: 5.0000 mg | ORAL_TABLET | Freq: Every evening | ORAL | Status: DC | PRN

## 2016-03-22 MED ORDER — ASPIRIN EC 325 MG PO TBEC
325.0000 mg | DELAYED_RELEASE_TABLET | Freq: Two times a day (BID) | ORAL | Status: DC
  Administered 2016-03-23 – 2016-03-24 (×3): 325 mg via ORAL
  Filled 2016-03-22 (×4): qty 1

## 2016-03-22 MED ORDER — PHENYLEPHRINE HCL 10 MG/ML IJ SOLN
INTRAVENOUS | Status: DC | PRN
  Administered 2016-03-22: 40 ug/min via INTRAVENOUS

## 2016-03-22 MED ORDER — LACTATED RINGERS IV SOLN
INTRAVENOUS | Status: DC
  Administered 2016-03-22: 10:00:00 via INTRAVENOUS

## 2016-03-22 MED ORDER — 0.9 % SODIUM CHLORIDE (POUR BTL) OPTIME
TOPICAL | Status: DC | PRN
  Administered 2016-03-22: 1000 mL

## 2016-03-22 MED ORDER — TRANEXAMIC ACID 1000 MG/10ML IV SOLN
1000.0000 mg | INTRAVENOUS | Status: AC
  Administered 2016-03-22: 1000 mg via INTRAVENOUS
  Filled 2016-03-22: qty 10

## 2016-03-22 MED ORDER — POLYETHYLENE GLYCOL 3350 17 G PO PACK: 17.0000 g | PACK | Freq: Every day | ORAL | Status: DC | PRN

## 2016-03-22 MED ORDER — OXYCODONE-ACETAMINOPHEN 5-325 MG PO TABS: 1.0000 | ORAL_TABLET | Freq: Four times a day (QID) | ORAL | 0 refills | Status: DC | PRN

## 2016-03-22 MED ORDER — FENTANYL CITRATE (PF) 100 MCG/2ML IJ SOLN
INTRAMUSCULAR | Status: DC | PRN
  Administered 2016-03-22: 25 ug via INTRAVENOUS

## 2016-03-22 MED ORDER — FENTANYL CITRATE (PF) 100 MCG/2ML IJ SOLN
INTRAMUSCULAR | Status: AC
  Administered 2016-03-22: 25 ug via INTRAVENOUS
  Filled 2016-03-22: qty 2

## 2016-03-22 MED ORDER — ONDANSETRON HCL 4 MG PO TABS: 4.0000 mg | ORAL_TABLET | Freq: Four times a day (QID) | ORAL | Status: DC | PRN

## 2016-03-22 MED ORDER — SODIUM CHLORIDE 0.9 % IJ SOLN
INTRAMUSCULAR | Status: DC | PRN
  Administered 2016-03-22: 20 mL

## 2016-03-22 MED ORDER — DEXAMETHASONE SODIUM PHOSPHATE 10 MG/ML IJ SOLN
10.0000 mg | Freq: Two times a day (BID) | INTRAMUSCULAR | Status: AC
  Administered 2016-03-22 – 2016-03-23 (×3): 10 mg via INTRAVENOUS
  Filled 2016-03-22 (×3): qty 1

## 2016-03-22 MED ORDER — OXYCODONE HCL 5 MG PO TABS
5.0000 mg | ORAL_TABLET | ORAL | Status: DC | PRN
Start: 2016-03-22 — End: 2016-03-24
  Administered 2016-03-23 (×2): 10 mg via ORAL
  Administered 2016-03-23 – 2016-03-24 (×3): 5 mg via ORAL
  Administered 2016-03-24: 10 mg via ORAL
  Filled 2016-03-22: qty 2
  Filled 2016-03-22: qty 1
  Filled 2016-03-22: qty 2
  Filled 2016-03-22 (×2): qty 1
  Filled 2016-03-22: qty 2

## 2016-03-22 MED ORDER — BUPIVACAINE-EPINEPHRINE (PF) 0.5% -1:200000 IJ SOLN
INTRAMUSCULAR | Status: DC | PRN
  Administered 2016-03-22: 30 mL via PERINEURAL

## 2016-03-22 MED ORDER — DEXAMETHASONE SODIUM PHOSPHATE 10 MG/ML IJ SOLN
INTRAMUSCULAR | Status: AC
  Filled 2016-03-22: qty 1

## 2016-03-22 MED ORDER — TIZANIDINE HCL 2 MG PO TABS: 2.0000 mg | ORAL_TABLET | Freq: Three times a day (TID) | ORAL | 0 refills | Status: DC | PRN

## 2016-03-22 MED ORDER — GABAPENTIN 300 MG PO CAPS
300.0000 mg | ORAL_CAPSULE | Freq: Two times a day (BID) | ORAL | Status: DC
  Administered 2016-03-23 – 2016-03-24 (×3): 300 mg via ORAL
  Filled 2016-03-22 (×4): qty 1

## 2016-03-22 MED ORDER — SODIUM CHLORIDE 0.9 % IR SOLN
Status: DC | PRN
  Administered 2016-03-22: 3000 mL

## 2016-03-22 MED ORDER — METHOCARBAMOL 500 MG PO TABS
500.0000 mg | ORAL_TABLET | Freq: Four times a day (QID) | ORAL | Status: DC | PRN
  Administered 2016-03-23 – 2016-03-24 (×3): 500 mg via ORAL
  Filled 2016-03-22 (×3): qty 1

## 2016-03-22 MED ORDER — PHENYLEPHRINE 40 MCG/ML (10ML) SYRINGE FOR IV PUSH (FOR BLOOD PRESSURE SUPPORT)
PREFILLED_SYRINGE | INTRAVENOUS | Status: AC
  Filled 2016-03-22: qty 10

## 2016-03-22 MED ORDER — TIMOLOL MALEATE 0.25 % OP SOLN
1.0000 [drp] | Freq: Two times a day (BID) | OPHTHALMIC | Status: DC
  Administered 2016-03-22 – 2016-03-24 (×4): 1 [drp] via OPHTHALMIC
  Filled 2016-03-22: qty 5

## 2016-03-22 MED ORDER — CLINDAMYCIN PHOSPHATE 900 MG/50ML IV SOLN
INTRAVENOUS | Status: AC
  Filled 2016-03-22: qty 50

## 2016-03-22 MED ORDER — ONDANSETRON HCL 4 MG/2ML IJ SOLN
4.0000 mg | Freq: Once | INTRAMUSCULAR | Status: AC | PRN
  Administered 2016-03-22: 4 mg via INTRAVENOUS

## 2016-03-22 MED ORDER — LORATADINE 10 MG PO TABS
10.0000 mg | ORAL_TABLET | Freq: Every day | ORAL | Status: DC
  Administered 2016-03-23 – 2016-03-24 (×2): 10 mg via ORAL
  Filled 2016-03-22 (×2): qty 1

## 2016-03-22 MED ORDER — PHENYLEPHRINE HCL 10 MG/ML IJ SOLN: INTRAMUSCULAR | Status: DC | PRN

## 2016-03-22 SURGICAL SUPPLY — 64 items
BANDAGE ESMARK 6X9 LF (GAUZE/BANDAGES/DRESSINGS) ×1 IMPLANT
BENZOIN TINCTURE PRP APPL 2/3 (GAUZE/BANDAGES/DRESSINGS) ×3 IMPLANT
BLADE SAGITTAL 25.0X1.19X90 (BLADE) ×2 IMPLANT
BLADE SAGITTAL 25.0X1.19X90MM (BLADE) ×1
BLADE SAW SAG 90X13X1.27 (BLADE) ×3 IMPLANT
BNDG ESMARK 6X9 LF (GAUZE/BANDAGES/DRESSINGS) ×3
BOWL SMART MIX CTS (DISPOSABLE) ×3 IMPLANT
CAPT KNEE TOTAL 3 ATTUNE ×3 IMPLANT
CEMENT HV SMART SET (Cement) ×6 IMPLANT
CLOSURE WOUND 1/2 X4 (GAUZE/BANDAGES/DRESSINGS) ×1
COVER SURGICAL LIGHT HANDLE (MISCELLANEOUS) ×3 IMPLANT
CUFF TOURNIQUET SINGLE 34IN LL (TOURNIQUET CUFF) ×3 IMPLANT
CUFF TOURNIQUET SINGLE 44IN (TOURNIQUET CUFF) IMPLANT
DRAPE EXTREMITY T 121X128X90 (DRAPE) ×3 IMPLANT
DRAPE IMP U-DRAPE 54X76 (DRAPES) ×3 IMPLANT
DRAPE U-SHAPE 47X51 STRL (DRAPES) ×3 IMPLANT
DRSG AQUACEL AG ADV 3.5X10 (GAUZE/BANDAGES/DRESSINGS) ×3 IMPLANT
DRSG MEPILEX BORDER 4X12 (GAUZE/BANDAGES/DRESSINGS) ×3 IMPLANT
DRSG PAD ABDOMINAL 8X10 ST (GAUZE/BANDAGES/DRESSINGS) ×3 IMPLANT
DURAPREP 26ML APPLICATOR (WOUND CARE) ×3 IMPLANT
ELECT REM PT RETURN 9FT ADLT (ELECTROSURGICAL) ×3
ELECTRODE REM PT RTRN 9FT ADLT (ELECTROSURGICAL) ×1 IMPLANT
EVACUATOR 1/8 PVC DRAIN (DRAIN) IMPLANT
FACESHIELD WRAPAROUND (MASK) ×3 IMPLANT
GAUZE SPONGE 4X4 12PLY STRL (GAUZE/BANDAGES/DRESSINGS) ×3 IMPLANT
GAUZE SPONGE 4X4 16PLY XRAY LF (GAUZE/BANDAGES/DRESSINGS) ×3 IMPLANT
GLOVE BIOGEL PI IND STRL 8 (GLOVE) ×2 IMPLANT
GLOVE BIOGEL PI INDICATOR 8 (GLOVE) ×4
GLOVE ECLIPSE 7.5 STRL STRAW (GLOVE) ×6 IMPLANT
GOWN STRL REUS W/ TWL LRG LVL3 (GOWN DISPOSABLE) ×1 IMPLANT
GOWN STRL REUS W/ TWL XL LVL3 (GOWN DISPOSABLE) ×2 IMPLANT
GOWN STRL REUS W/TWL LRG LVL3 (GOWN DISPOSABLE) ×2
GOWN STRL REUS W/TWL XL LVL3 (GOWN DISPOSABLE) ×4
HANDPIECE INTERPULSE COAX TIP (DISPOSABLE) ×2
HOOD PEEL AWAY FACE SHEILD DIS (HOOD) ×9 IMPLANT
IMMOBILIZER KNEE 20 (SOFTGOODS) IMPLANT
IMMOBILIZER KNEE 22 UNIV (SOFTGOODS) ×3 IMPLANT
KIT BASIN OR (CUSTOM PROCEDURE TRAY) ×3 IMPLANT
KIT ROOM TURNOVER OR (KITS) ×3 IMPLANT
MANIFOLD NEPTUNE II (INSTRUMENTS) ×3 IMPLANT
NEEDLE SPNL 22GX3.5 QUINCKE BK (NEEDLE) ×3 IMPLANT
NS IRRIG 1000ML POUR BTL (IV SOLUTION) ×3 IMPLANT
PACK TOTAL JOINT (CUSTOM PROCEDURE TRAY) ×3 IMPLANT
PACK UNIVERSAL I (CUSTOM PROCEDURE TRAY) ×3 IMPLANT
PAD ARMBOARD 7.5X6 YLW CONV (MISCELLANEOUS) ×6 IMPLANT
PAD CAST 4YDX4 CTTN HI CHSV (CAST SUPPLIES) ×1 IMPLANT
PADDING CAST COTTON 4X4 STRL (CAST SUPPLIES) ×2
PADDING CAST COTTON 6X4 STRL (CAST SUPPLIES) ×3 IMPLANT
SET HNDPC FAN SPRY TIP SCT (DISPOSABLE) ×1 IMPLANT
STAPLER VISISTAT 35W (STAPLE) IMPLANT
STRIP CLOSURE SKIN 1/2X4 (GAUZE/BANDAGES/DRESSINGS) ×2 IMPLANT
SUCTION FRAZIER HANDLE 10FR (MISCELLANEOUS) ×2
SUCTION TUBE FRAZIER 10FR DISP (MISCELLANEOUS) ×1 IMPLANT
SUT MNCRL AB 3-0 PS2 18 (SUTURE) IMPLANT
SUT VIC AB 0 CTB1 27 (SUTURE) ×6 IMPLANT
SUT VIC AB 1 CT1 27 (SUTURE) ×4
SUT VIC AB 1 CT1 27XBRD ANBCTR (SUTURE) ×2 IMPLANT
SUT VIC AB 2-0 CTB1 (SUTURE) ×6 IMPLANT
SYR 50ML LL SCALE MARK (SYRINGE) ×3 IMPLANT
TOWEL OR 17X24 6PK STRL BLUE (TOWEL DISPOSABLE) ×3 IMPLANT
TOWEL OR 17X26 10 PK STRL BLUE (TOWEL DISPOSABLE) ×3 IMPLANT
TRAY CATH 16FR W/PLASTIC CATH (SET/KITS/TRAYS/PACK) ×3 IMPLANT
TRAY FOLEY CATH 16FRSI W/METER (SET/KITS/TRAYS/PACK) IMPLANT
WRAP KNEE MAXI GEL POST OP (GAUZE/BANDAGES/DRESSINGS) ×3 IMPLANT

## 2016-03-22 NOTE — Anesthesia Postprocedure Evaluation (Signed)
Anesthesia Post Note  Patient: Angela Morales  Procedure(s) Performed: Procedure(s) (LRB): TOTAL KNEE ARTHROPLASTY (Left)  Patient location during evaluation: PACU Anesthesia Type: Spinal Level of consciousness: oriented and awake and alert Pain management: pain level controlled Vital Signs Assessment: post-procedure vital signs reviewed and stable Respiratory status: spontaneous breathing, respiratory function stable and patient connected to nasal cannula oxygen Cardiovascular status: blood pressure returned to baseline and stable Postop Assessment: no headache and no backache Anesthetic complications: no    Last Vitals:  Vitals:   03/22/16 1630 03/22/16 1645  BP: 128/74 116/76  Pulse:    Resp:    Temp:      Last Pain:  Vitals:   03/22/16 1645  PainSc: 7                  Linton Rump

## 2016-03-22 NOTE — Progress Notes (Signed)
Orthopedic Tech Progress Note Patient Details:  Angela Morales 1945/12/28 [id number]  CPM Left Knee CPM Left Knee: On Left Knee Flexion (Degrees): 90 Left Knee Extension (Degrees): 0   [name] 03/22/2016, 2:21 PM Pt is on icu bed and trapeze bar patient helper will not fit; RN notified

## 2016-03-22 NOTE — Anesthesia Procedure Notes (Signed)
Anesthesia Regional Block:  Adductor canal block  Pre-Anesthetic Checklist: ,, timeout performed, Correct Patient, Correct Site, Correct Laterality, Correct Procedure, Correct Position, site marked, Risks and benefits discussed,  Surgical consent,  Pre-op evaluation,  At surgeon's request and post-op pain management  Laterality: Left  Prep: chloraprep       Needles:  Injection technique: Single-shot  Needle Type: Echogenic Stimulator Needle     Needle Length: 9cm 9 cm Needle Gauge: 21 and 21 G    Additional Needles:  Procedures: ultrasound guided (picture in chart) Adductor canal block Narrative:  Start time: 03/22/2016 10:10 AM End time: 03/22/2016 10:15 AM Injection made incrementally with aspirations every 5 mL.  Performed by: Personally  Anesthesiologist: Linton Rump

## 2016-03-22 NOTE — Transfer of Care (Signed)
Immediate Anesthesia Transfer of Care Note  Patient: Angela Morales  Procedure(s) Performed: Procedure(s): TOTAL KNEE ARTHROPLASTY (Left)  Patient Location: PACU  Anesthesia Type:Spinal  Level of Consciousness: awake, alert  and oriented  Airway & Oxygen Therapy: Patient Spontanous Breathing and Patient connected to nasal cannula oxygen  Post-op Assessment: Report given to RN, Post -op Vital signs reviewed and stable and Patient moving all extremities X 4  Post vital signs: Reviewed and stable  Last Vitals: There were no vitals filed for this visit.  Last Pain: There were no vitals filed for this visit.    Patients Stated Pain Goal: 3 (03/22/16 0910)  Complications: No apparent anesthesia complications

## 2016-03-22 NOTE — Progress Notes (Signed)
Dr.Allan made aware that pt can't get her rings off.

## 2016-03-22 NOTE — Discharge Instructions (Signed)

## 2016-03-22 NOTE — Anesthesia Procedure Notes (Signed)
Spinal  Patient location during procedure: OR Start time: 03/22/2016 11:35 AM End time: 03/22/2016 11:38 AM Staffing Anesthesiologist: Linton Rump Performed: anesthesiologist  Preanesthetic Checklist Completed: patient identified, surgical consent, pre-op evaluation, timeout performed, IV checked, risks and benefits discussed and monitors and equipment checked Spinal Block Patient position: sitting Prep: DuraPrep Patient monitoring: heart rate, cardiac monitor, continuous pulse ox and blood pressure Approach: midline Location: L2-3 Injection technique: single-shot Needle Needle type: Pencan  Needle gauge: 24 G Needle length: 9 cm

## 2016-03-23 ENCOUNTER — Encounter (HOSPITAL_COMMUNITY): Payer: Self-pay | Admitting: Orthopedic Surgery

## 2016-03-23 LAB — BASIC METABOLIC PANEL
Anion gap: 7 (ref 5–15)
BUN: 7 mg/dL (ref 6–20)
CALCIUM: 8.5 mg/dL — AB (ref 8.9–10.3)
CHLORIDE: 102 mmol/L (ref 101–111)
CO2: 24 mmol/L (ref 22–32)
CREATININE: 0.53 mg/dL (ref 0.44–1.00)
GFR calc Af Amer: >60 mL/min (ref >60)
GFR calc non Af Amer: >60 mL/min (ref >60)
GLUCOSE: 155 mg/dL — AB (ref 65–99)
Potassium: 3.6 mmol/L (ref 3.5–5.1)
Sodium: 133 mmol/L — ABNORMAL LOW (ref 135–145)

## 2016-03-23 LAB — CBC
HEMATOCRIT: 31.6 % — AB (ref 36.0–46.0)
Hemoglobin: 10.1 g/dL — ABNORMAL LOW (ref 12.0–15.0)
MCH: 28.7 pg (ref 26.0–34.0)
MCHC: 32 g/dL (ref 30.0–36.0)
MCV: 89.8 fL (ref 78.0–100.0)
PLATELETS: 265 10*3/uL (ref 150–400)
RBC: 3.52 MIL/uL — ABNORMAL LOW (ref 3.87–5.11)
RDW: 15.6 % — AB (ref 11.5–15.5)
WBC: 12 10*3/uL — ABNORMAL HIGH (ref 4.0–10.5)

## 2016-03-23 NOTE — Evaluation (Signed)
Physical Therapy Evaluation Patient Details Name: Angela Morales MRN: [id number] DOB: 1945-09-27 Today's Date: 03/23/2016   History of Present Illness  Patient is a 70 y/o female with hx of SVT, glaucoma, A-fib presents s/p left TKA.   Clinical Impression  Patient presents with pain and post surgical deficits s/p left TKA. Tolerated short distance ambulation within room with Min guard assist for safety and max cues for proper gait mechanics. Education re: exercises, precautions, positioning etc. Pt will have support from spouse at home. Will follow acutely to maximize independence and mobility prior to return home.     Follow Up Recommendations Home health PT;Supervision for mobility/OOB    Equipment Recommendations  None recommended by PT    Recommendations for Other Services       Precautions / Restrictions Precautions Precautions: Knee Precaution Booklet Issued: No Precaution Comments: Reviewed no pillow under knee and precautions Required Braces or Orthoses: Knee Immobilizer - Left Knee Immobilizer - Left: On when out of bed or walking Restrictions Weight Bearing Restrictions: Yes LLE Weight Bearing: Weight bearing as tolerated      Mobility  Bed Mobility Overal bed mobility: Needs Assistance Bed Mobility: Supine to Sit     Supine to sit: Min assist;HOB elevated     General bed mobility comments: Assist to bring LLE to EOB. Increased time.  Transfers Overall transfer level: Needs assistance Equipment used: Rolling walker (2 wheeled)   Sit to Stand: Mod assist         General transfer comment: Assist to power to standing with cues for hand placement/technique. Difficulty due to [facility] donned. Transferred to chair post ambulation bout.  Ambulation/Gait Ambulation/Gait assistance: Min guard Ambulation Distance (Feet): 25 Feet Assistive device: Rolling walker (2 wheeled) Gait Pattern/deviations: Step-to pattern;Decreased stance time - left;Decreased step length  - right;Decreased stride length;Trunk flexed;Shuffle Gait velocity: decreased   General Gait Details: Step to gait pattern with LLE always anterior to RLE despite cues for step through gait.   Stairs            Wheelchair Mobility    Modified Rankin (Stroke Patients Only)       Balance Overall balance assessment: Needs assistance Sitting-balance support: Feet supported;No upper extremity supported Sitting balance-Leahy Scale: Fair     Standing balance support: During functional activity Standing balance-Leahy Scale: Poor Standing balance comment: Reliant on BUEs for support.                             Pertinent Vitals/Pain Pain Assessment: 0-10 Pain Score: 6  Pain Location: left knee Pain Descriptors / Indicators: Sore;Operative site guarding;Grimacing Pain Intervention(s): Monitored during session;Repositioned;Ice applied;Limited activity within patient's tolerance    Home Living Family/patient expects to be discharged to:: Private residence Living Arrangements: Spouse/significant other Available Help at Discharge: Family;Available 24 hours/day Type of Home: House Home Access: Stairs to enter Entrance Stairs-Rails: Right Entrance Stairs-Number of Steps: 4 Home Layout: Two level;Able to live on main level with bedroom/bathroom Home Equipment: Dan Humphreys - 2 wheels;Cane - single point;Bedside commode      Prior Function Level of Independence: Independent         Comments: Drives, cooks, Conservation officer, historic buildings Dominance        Extremity/Trunk Assessment   Upper Extremity Assessment: Defer to OT evaluation           Lower Extremity Assessment: LLE deficits/detail   LLE Deficits / Details: Limited AROM/strength secondary to  pain/post op     Communication   Communication: No difficulties  Cognition Arousal/Alertness: Awake/alert Behavior During Therapy: WFL for tasks assessed/performed Overall Cognitive Status: Within Functional Limits for  tasks assessed                      General Comments General comments (skin integrity, edema, etc.): Spouse present towards end of session.    Exercises Total Joint Exercises Ankle Circles/Pumps: Both;10 reps;Supine Quad Sets: Both;10 reps;Supine Towel Squeeze: Both;10 reps;Seated Heel Slides: Left;10 reps;Seated Hip ABduction/ADduction: Left;10 reps;Seated Goniometric ROM: 10-65 degrees knee AROM   Assessment/Plan    PT Assessment Patient needs continued PT services  PT Problem List Decreased strength;Decreased mobility;Decreased knowledge of precautions;Decreased range of motion;Decreased activity tolerance;Pain;Impaired sensation;Decreased balance          PT Treatment Interventions DME instruction;Therapeutic activities;Gait training;Therapeutic exercise;Patient/family education;Stair training;Balance training;Functional mobility training    PT Goals (Current goals can be found in the Care Plan section)  Acute Rehab PT Goals Patient Stated Goal: to be able to play with my grand kids PT Goal Formulation: With patient Time For Goal Achievement: 04/06/16 Potential to Achieve Goals: Good    Frequency 7X/week   Barriers to discharge Inaccessible home environment stairs to enter home    Co-evaluation               End of Session Equipment Utilized During Treatment: Gait belt;Left knee immobilizer Activity Tolerance: Patient limited by pain Patient left: in chair;with call bell/phone within reach;with family/visitor present Nurse Communication: Mobility status         Time: 2595-6387 PT Time Calculation (min) (ACUTE ONLY): 27 min   Charges:   PT Evaluation $PT Eval Low Complexity: 1 Procedure PT Treatments $Gait Training: 8-22 mins   PT G Codes:        Angela Morales 03/23/2016, 9:39 AM Mylo Red, PT, DPT [phone]

## 2016-03-23 NOTE — Progress Notes (Signed)
Subjective: 1 Day Post-Op Procedure(s) (LRB): TOTAL KNEE ARTHROPLASTY (Left) Patient reports pain as moderate.  Taking by mouth/voiding okay.  Objective: Vital signs in last 24 hours: Temp:  [97 F (36.1 C)-98.9 F (37.2 C)] 98.2 F (36.8 C) (09/26 0853) Pulse Rate:  [63-102] 74 (09/26 0853) Resp:  [12-16] 16 (09/26 0529) BP: (93-198)/(47-170) 106/47 (09/26 0853) SpO2:  [95 %-100 %] 99 % (09/26 0853)  Intake/Output from previous day: 09/25 0701 - 09/26 0700 In: 1500 [I.V.:1390; IV Piggyback:110] Out: 750 [Urine:600; Blood:150] Intake/Output this shift: Total I/O In: 240 [P.O.:240] Out: -    Recent Labs  03/23/16 0315  HGB 10.1*    Recent Labs  03/23/16 0315  WBC 12.0*  RBC 3.52*  HCT 31.6*  PLT 265    Recent Labs  03/23/16 0315  NA 133*  K 3.6  CL 102  CO2 24  BUN 7  CREATININE 0.53  GLUCOSE 155*  CALCIUM 8.5*   No results for input(s): LABPT, INR in the last 72 hours. Left knee exam: Neurovascular intact Sensation intact distally Intact pulses distally Dorsiflexion/Plantar flexion intact Incision: dressing C/D/I Compartment soft  Assessment/Plan: 1 Day Post-Op Procedure(s) (LRB): TOTAL KNEE ARTHROPLASTY (Left)  Plan: Aspirin 325 mg twice daily for DVT prophylaxis. Up with therapy Discharge home with home health tomorrow morning.  Ezri Landers G 03/23/2016, 9:24 AM

## 2016-03-23 NOTE — Progress Notes (Signed)
Physical Therapy Treatment Patient Details Name: Angela Morales MRN: [id number] DOB: Feb 26, 1946 Today's Date: 03/23/2016    History of Present Illness Patient is a 70 y/o female with hx of SVT, glaucoma, A-fib presents s/p left TKA.     PT Comments    Patient progressing well towards PT goals. Pain more controlled this afternoon. Improved ambulation distance and demonstrated more normalized gait pattern with max cues. Instructed pt in exercises. Plan for stair training tomorrow morning as tolerated. Will follow.   Follow Up Recommendations  Home health PT;Supervision for mobility/OOB     Equipment Recommendations  None recommended by PT    Recommendations for Other Services       Precautions / Restrictions Precautions Precautions: Knee Precaution Booklet Issued: No Precaution Comments: Reviewed no pillow under knee and precautions Required Braces or Orthoses: Knee Immobilizer - Left Knee Immobilizer - Left: On when out of bed or walking Restrictions Weight Bearing Restrictions: Yes LLE Weight Bearing: Weight bearing as tolerated    Mobility  Bed Mobility Overal bed mobility: Needs Assistance Bed Mobility: Sit to Supine       Sit to supine: Min assist   General bed mobility comments: Assist to bring LLE into bed with cues.  Transfers Overall transfer level: Needs assistance Equipment used: Rolling walker (2 wheeled) Transfers: Sit to/from Stand Sit to Stand: Min guard         General transfer comment: Min guard for safety. Stood from [employer], from toilet x1. Good demo of hand placement.    Ambulation/Gait Ambulation/Gait assistance: Min guard Ambulation Distance (Feet): 50 Feet Assistive device: Rolling walker (2 wheeled) Gait Pattern/deviations: Decreased stance time - left;Decreased step length - right;Step-to pattern;Step-through pattern;Trunk flexed Gait velocity: decreased Gait velocity interpretation: Below normal speed for age/gender General  Gait Details: Emphasized step through gait and knee extension during stance phase to activate quads. Cues to relax shoulders.   Stairs            Wheelchair Mobility    Modified Rankin (Stroke Patients Only)       Balance Overall balance assessment: Needs assistance Sitting-balance support: Feet supported;No upper extremity supported Sitting balance-Leahy Scale: Fair     Standing balance support: During functional activity Standing balance-Leahy Scale: Poor                      Cognition Arousal/Alertness: Awake/alert Behavior During Therapy: WFL for tasks assessed/performed Overall Cognitive Status: Within Functional Limits for tasks assessed                      Exercises Total Joint Exercises Quad Sets: Both;10 reps;Supine Towel Squeeze: Both;10 reps;Supine Heel Slides: Left;10 reps;Supine Hip ABduction/ADduction: Left;10 reps;Supine    General Comments        Pertinent Vitals/Pain Pain Assessment: 0-10 Pain Score: 5  Pain Location: left knee Pain Descriptors / Indicators: Sore;Operative site guarding;Guarding Pain Intervention(s): Monitored during session;Repositioned;Premedicated before session    Home Living                      Prior Function            PT Goals (current goals can now be found in the care plan section) Progress towards PT goals: Progressing toward goals    Frequency    7X/week      PT Plan Current plan remains appropriate    Co-evaluation  End of Session Equipment Utilized During Treatment: Gait belt Activity Tolerance: Patient tolerated treatment well Patient left: in bed;with call bell/phone within reach     Time: 1330-1400 PT Time Calculation (min) (ACUTE ONLY): 30 min  Charges:  $Gait Training: 8-22 mins $Therapeutic Exercise: 8-22 mins                    G Codes:      Patric Vanpelt A Marabeth Melland 03/23/2016, 2:44 PM Mylo Red, PT, DPT [phone]

## 2016-03-23 NOTE — Consult Note (Addendum)
[facility] Primary Care Navigator  03/23/2016  Angela Morales Apr 08, 1946 [id number]  Met with patient at the bedside to identify possible discharge needs.  Patient states having everything in place ready when she gets home. Patient shared that she had increased limitations/ movement due to pain on her knees wherein non-surgical treatments were no longer effective that led her to undergo surgery. She endorses Dr. Thressa Sheller from [facility] as her primary care provider.    Patient states using [facility] [employer] [address]) to obtain medications without difficulty and uses "pill box" system in managing her own medications.   According to patient, she is independent with self care and drives self prior to this admission but her husband [name]), 2 sons and daughter- in law will be able to provide transportation to her doctors' appointments. Her family (husband, sons and daughter-in law) are her caregivers at home as stated. Home health services anticipated upon discharge.   Patient voiced understanding to call primary care provider's office for a post discharge follow-up appointment within a week or sooner if needed. Patient letter given as a reminder.  She denies any further needs or concerns at this time.   For additional questions please contact:  Edwena Felty A. Belky Mundo, BSN, RN-BC [facility] PRIMARY CARE Navigator Cell: [phone]

## 2016-03-23 NOTE — Progress Notes (Signed)
Orthopedic Tech Progress Note Patient Details:  Angela Morales Aug 30, 1945 [id number] Ortho visit put on cpm at 1900 Patient ID: [name], female   DOB: Oct 17, 1945, 70 y.o.   MRN: [id number]   [name] 03/23/2016, 7:00 PM

## 2016-03-24 LAB — CBC
HEMATOCRIT: 29.2 % — AB (ref 36.0–46.0)
HEMOGLOBIN: 9.4 g/dL — AB (ref 12.0–15.0)
MCH: 28.9 pg (ref 26.0–34.0)
MCHC: 32.2 g/dL (ref 30.0–36.0)
MCV: 89.8 fL (ref 78.0–100.0)
Platelets: 231 10*3/uL (ref 150–400)
RBC: 3.25 MIL/uL — ABNORMAL LOW (ref 3.87–5.11)
RDW: 15.5 % (ref 11.5–15.5)
WBC: 21.2 10*3/uL — ABNORMAL HIGH (ref 4.0–10.5)

## 2016-03-24 MED ORDER — NAPROXEN SODIUM 220 MG PO TABS: 220.0000 mg | ORAL_TABLET | Freq: Two times a day (BID) | ORAL | 0 refills | Status: DC | PRN

## 2016-03-24 NOTE — Care Management Note (Signed)
Case Management Note  Patient Details  Name: Angela Morales MRN: [id number] Date of Birth: 09/18/1945  Subjective/Objective:     70 yr old female s/p left total knee arthroplasty.                 Action/Plan: Case manager spoke with patient concerning Home Health and DME needs. Patient was preoperatively setup with [facility], no changes. She states she will have family support at discharge.    Expected Discharge Date:   03/24/16               Expected Discharge Plan:  Home w Home Health Services  In-House Referral:     Discharge planning Services  CM Consult  Post Acute Care Choice:  Home Health Choice offered to:  Patient  DME Arranged:  N/A DME Agency:  NA  HH Arranged:  PT HH Agency:  [facility]  Status of Service:  Completed, signed off  If discussed at Long Length of Stay Meetings, dates discussed:    Additional Comments:  Durenda Guthrie, RN 03/24/2016, 10:38 AM

## 2016-03-24 NOTE — Brief Op Note (Signed)
03/22/2016  3:30 PM  PATIENT:  Angela Morales  70 y.o. female  PRE-OPERATIVE DIAGNOSIS:  Osteoarthritis left knee  POST-OPERATIVE DIAGNOSIS:  Osteoarthritis left knee  PROCEDURE:  Procedure(s): TOTAL KNEE ARTHROPLASTY (Left)  SURGEON:  Surgeon(s) and Role:    * Dorna Leitz, MD - Primary  PHYSICIAN ASSISTANT:   ASSISTANTS: bethune   ANESTHESIA:   spinal  EBL:  No intake/output data recorded.  BLOOD ADMINISTERED:none  DRAINS: none   LOCAL MEDICATIONS USED:  MARCAINE     SPECIMEN:  No Specimen  DISPOSITION OF SPECIMEN:  N/A  COUNTS:  YES  TOURNIQUET:   Total Tourniquet Time Documented: Thigh (Left) - 58 minutes Total: Thigh (Left) - 58 minutes   DICTATION: .Other Dictation: Dictation Number [id number]  PLAN OF CARE: Admit to inpatient   PATIENT DISPOSITION:  PACU - hemodynamically stable.   Delay start of Pharmacological VTE agent (>24hrs) due to surgical blood loss or risk of bleeding: no

## 2016-03-24 NOTE — Progress Notes (Signed)
Subjective: 2 Days Post-Op Procedure(s) (LRB): TOTAL KNEE ARTHROPLASTY (Left) Patient reports pain as mild.  Progressing with physical therapy. Taking by mouth and voiding okay. Will be ready for discharge after physical therapy today.  Objective: Vital signs in last 24 hours: Temp:  [98.1 F (36.7 C)-99.1 F (37.3 C)] 98.1 F (36.7 C) (09/27 0537) Pulse Rate:  [72-81] 72 (09/27 0537) Resp:  [18] 18 (09/27 0537) BP: (102-110)/(44-54) 110/54 (09/27 0537) SpO2:  [96 %-99 %] 98 % (09/27 0537)  Intake/Output from previous day: 09/26 0701 - 09/27 0700 In: 600 [P.O.:480; I.V.:120] Out: -  Intake/Output this shift: No intake/output data recorded.   Recent Labs  03/23/16 0315 03/24/16 0639  HGB 10.1* 9.4*    Recent Labs  03/23/16 0315 03/24/16 0639  WBC 12.0* 21.2*  RBC 3.52* 3.25*  HCT 31.6* 29.2*  PLT 265 231    Recent Labs  03/23/16 0315  NA 133*  K 3.6  CL 102  CO2 24  BUN 7  CREATININE 0.53  GLUCOSE 155*  CALCIUM 8.5*   No results for input(s): LABPT, INR in the last 72 hours. Left knee exam: Neurovascular intact Sensation intact distally Intact pulses distally Dorsiflexion/Plantar flexion intact Incision: dressing C/D/I Compartment soft  Assessment/Plan: 2 Days Post-Op Procedure(s) (LRB): TOTAL KNEE ARTHROPLASTY (Left) Plan: Aspirin enteric-coated 325 mg twice daily with food 1 month postop for DVT prophylaxis. Up with therapy Discharge home with home health Weight-bear as tolerated on the left. Follow-up with Jim/Dr. Luiz Blare in 2 weeks. Thena Devora G 03/24/2016, 8:34 AM

## 2016-03-24 NOTE — Discharge Summary (Signed)
Patient ID: Angela Morales MRN: [id number] DOB/AGE: 1946-06-27 70 y.o.  Admit date: 03/22/2016 Discharge date: 03/24/2016  Admission Diagnoses:  Principal Problem:   Primary osteoarthritis of left knee   Discharge Diagnoses:  Same  Past Medical History:  Diagnosis Date  . Abnormal uterine bleeding    on HRT  . Allergy    seasonal  . Arthritis   . Atrial fibrillation (HCC)   . DDD (degenerative disc disease)   . Dysrhythmia   . Glaucoma   . PONV (postoperative nausea and vomiting)   . SVT (supraventricular tachycardia) (HCC)     Surgeries: Procedure(s):Left TOTAL KNEE ARTHROPLASTY on 03/22/2016   Discharged Condition: Improved  Hospital Course: Angela Morales is an 70 y.o. female who was admitted 03/22/2016 for operative treatment ofPrimary osteoarthritis of left knee. Patient has severe unremitting pain that affects sleep, daily activities, and work/hobbies. After pre-op clearance the patient was taken to the operating room on 03/22/2016 and underwent  Procedure(s): Left TOTAL KNEE ARTHROPLASTY.    Patient was given perioperative antibiotics: Anti-infectives    Start     Dose/Rate Route Frequency Ordered Stop   03/22/16 1730  clindamycin (CLEOCIN) IVPB 600 mg     600 mg 100 mL/hr over 30 Minutes Intravenous Every 6 hours 03/22/16 1716 03/22/16 2311   03/22/16 1100  clindamycin (CLEOCIN) IVPB 900 mg     900 mg 100 mL/hr over 30 Minutes Intravenous On call to O.R. 03/22/16 0858 03/22/16 1150   03/22/16 0902  clindamycin (CLEOCIN) 900 MG/50ML IVPB    Comments:  Lonia Chimera   : cabinet override      03/22/16 0902 03/22/16 1130       Patient was given sequential compression devices, early ambulation, and chemoprophylaxis to prevent DVT.  Patient benefited maximally from hospital stay and there were no complications.    Recent vital signs: Patient Vitals for the past 24 hrs:  BP Temp Temp src Pulse Resp SpO2  03/24/16 0537 (!) 110/54 98.1 F (36.7 C) Oral 72 18  98 %  03/23/16 1943 (!) 104/44 98.5 F (36.9 C) Oral 81 18 96 %  03/23/16 1521 (!) 102/46 99.1 F (37.3 C) - 76 18 97 %  03/23/16 0853 (!) 106/47 98.2 F (36.8 C) Oral 74 - 99 %     Recent laboratory studies:  Recent Labs  03/23/16 0315 03/24/16 0639  WBC 12.0* 21.2*  HGB 10.1* 9.4*  HCT 31.6* 29.2*  PLT 265 231  NA 133*  --   K 3.6  --   CL 102  --   CO2 24  --   BUN 7  --   CREATININE 0.53  --   GLUCOSE 155*  --   CALCIUM 8.5*  --      Discharge Medications:     Medication List    TAKE these medications   aspirin EC 325 MG tablet Take 1 tablet (325 mg total) by mouth 2 (two) times daily after a meal. Take x 1 month post op to decrease risk of blood clots. What changed:  medication strength  how much to take  when to take this  additional instructions   Glucosamine HCl 1500 MG Tabs Take 1 tablet by mouth daily. With MSN   loratadine 10 MG tablet Commonly known as:  CLARITIN Take 10 mg by mouth daily.   LOTEMAX 0.5 % ophthalmic suspension Generic drug:  loteprednol Place 1 drop into both eyes daily.   metoprolol tartrate 25 MG tablet Commonly  known as:  LOPRESSOR Take 12.5 mg by mouth 2 (two) times daily.   naproxen sodium 220 MG tablet Commonly known as:  ANAPROX Take 1 tablet (220 mg total) by mouth 2 (two) times daily as needed (pain). What changed:  how much to take   oxyCODONE-acetaminophen 5-325 MG tablet Commonly known as:  PERCOCET/ROXICET Take 1-2 tablets by mouth every 6 (six) hours as needed for severe pain.   psyllium 58.6 % packet Commonly known as:  METAMUCIL Take 1 packet by mouth daily.   timolol 0.25 % ophthalmic solution Commonly known as:  BETIMOL Place 1 drop into both eyes 2 (two) times daily.   tiZANidine 2 MG tablet Commonly known as:  ZANAFLEX Take 1 tablet (2 mg total) by mouth every 8 (eight) hours as needed for muscle spasms.   VITAMIN D PO Take 2,000 Int'l Units by mouth daily.       Diagnostic  Studies: Dg Chest 2 View  Result Date: 03/08/2016 CLINICAL DATA:  Preop for total knee arthroplasty. EXAM: CHEST  2 VIEW COMPARISON:  11/30/2013 FINDINGS: The cardiac silhouette, mediastinal and hilar contours are within normal limits and stable. Mild tortuosity of the thoracic aorta. The lungs demonstrate hyperinflation and mild attenuation of the pulmonary vasculature suggesting emphysematous changes. No infiltrates, edema or effusions. No worrisome pulmonary lesions. The bony thorax is intact. IMPRESSION: Suspect mild emphysematous changes. No acute pulmonary findings. Electronically Signed   By: Rudie Meyer M.D.   On: 03/08/2016 10:55    Disposition: 01-Home or Self Care  Discharge Instructions    CPM    Complete by:  As directed    Continuous passive motion machine (CPM):      Use the CPM from 0 to 70 for 8 hours per day.      You may increase by 5-10 per day.  You may break it up into 2 or 3 sessions per day.      Use CPM for 1-2 weeks or until you are told to stop.   Call MD / Call 911    Complete by:  As directed    If you experience chest pain or shortness of breath, CALL 911 and be transported to the hospital emergency room.  If you develope a fever above 101 F, pus (white drainage) or increased drainage or redness at the wound, or calf pain, call your surgeon's office.   Constipation Prevention    Complete by:  As directed    Drink plenty of fluids.  Prune juice may be helpful.  You may use a stool softener, such as Colace (over the counter) 100 mg twice a day.  Use MiraLax (over the counter) for constipation as needed.   Diet general    Complete by:  As directed    Do not put a pillow under the knee. Place it under the heel.    Complete by:  As directed    Increase activity slowly as tolerated    Complete by:  As directed    Weight bearing as tolerated    Complete by:  As directed    Laterality:  left   Extremity:  Lower      Follow-up Information    GRAVES,JOHN L,  MD. Schedule an appointment as soon as possible for a visit in 2 weeks.   Specialty:  Orthopedic Surgery Contact information: [address] [place] [address] [phone]            Signed: Matthew Folks 03/24/2016, 8:39 AM

## 2016-03-24 NOTE — Progress Notes (Signed)
Physical Therapy Treatment Patient Details Name: Angela Morales MRN: [id number] DOB: February 15, 1946 Today's Date: 03/24/2016    History of Present Illness Patient is a 70 y/o female with hx of SVT, glaucoma, A-fib presents s/p left TKA.     PT Comments    Pt performed review of HEP and stair training with husband present.  Pt ready to d/c and RN informed.    Follow Up Recommendations  Home health PT;Supervision for mobility/OOB     Equipment Recommendations  None recommended by PT    Recommendations for Other Services       Precautions / Restrictions Precautions Precautions: Knee Precaution Booklet Issued: No Precaution Comments: Reviewed no pillow under knee and precautions Restrictions Weight Bearing Restrictions: Yes LLE Weight Bearing: Weight bearing as tolerated    Mobility  Bed Mobility Overal bed mobility: Needs Assistance Bed Mobility: Sit to Supine;Supine to Sit     Supine to sit: Modified independent (Device/Increase time) Sit to supine: Modified independent (Device/Increase time)   General bed mobility comments: Good technique pt assisting LLE.    Transfers Overall transfer level: Needs assistance Equipment used: Rolling walker (2 wheeled) Transfers: Sit to/from Stand Sit to Stand: Supervision         General transfer comment: Good technique and improvement with eccentric loading.    Ambulation/Gait Ambulation/Gait assistance: Supervision Ambulation Distance (Feet): 200 Feet Assistive device: Rolling walker (2 wheeled) Gait Pattern/deviations: Step-through pattern;Decreased stride length;Trunk flexed;Decreased stance time - left Gait velocity: decreased   General Gait Details: Cues for L heel strike to improve knee extension in stance phase.  Cues for upper trunk control and forward gaze.  Cues for progression to step through pattern and gait symmetry.    Stairs Stairs: Yes   Stair Management: One rail Left;No rails;Step to  pattern;Sideways;Backwards;Forwards Number of Stairs: 7 General stair comments: Cues for sequencing and RW placement.  Pt performed x4 sideways with L rail and x3 backwards with RW to ascend and forwards with RW to descend.  Husband present and assisted patient.    Wheelchair Mobility    Modified Rankin (Stroke Patients Only)       Balance Overall balance assessment: Needs assistance   Sitting balance-Leahy Scale: Good       Standing balance-Leahy Scale: Fair                      Cognition Arousal/Alertness: Awake/alert Behavior During Therapy: WFL for tasks assessed/performed Overall Cognitive Status: Within Functional Limits for tasks assessed                      Exercises Total Joint Exercises Ankle Circles/Pumps: AROM;Both;10 reps;Supine Quad Sets: AROM;Both;10 reps;Supine Towel Squeeze: AROM;10 reps;Supine Short Arc Quad: AROM;Left;10 reps;Supine Heel Slides: AROM;Left;10 reps;Supine Hip ABduction/ADduction: AROM;Left;10 reps;Supine Straight Leg Raises: AROM;Left;10 reps;Supine Goniometric ROM: 78 degrees L knee flexion.      General Comments        Pertinent Vitals/Pain Pain Assessment: 0-10 Pain Score: 5  Pain Location: L knee Pain Descriptors / Indicators: Aching;Sore;Grimacing;Guarding Pain Intervention(s): Monitored during session;Repositioned;Ice applied    Home Living                      Prior Function            PT Goals (current goals can now be found in the care plan section) Acute Rehab PT Goals Patient Stated Goal: to be able to play with my grand kids Potential to  Achieve Goals: Good Progress towards PT goals: Progressing toward goals    Frequency    7X/week      PT Plan Current plan remains appropriate    Co-evaluation             End of Session Equipment Utilized During Treatment: Gait belt Activity Tolerance: Patient tolerated treatment well Patient left: in bed;with call bell/phone within  reach     Time: 4580-9983 PT Time Calculation (min) (ACUTE ONLY): 26 min  Charges:  $Gait Training: 8-22 mins $Therapeutic Exercise: 8-22 mins                    G Codes:      Florestine Avers 03/28/2016, 1:41 PM  Joycelyn Rua, PTA pager [phone]

## 2016-03-24 NOTE — Progress Notes (Signed)
Physical Therapy Treatment Patient Details Name: Angela Morales MRN: [id number] DOB: Jan 22, 1946 Today's Date: 03/24/2016    History of Present Illness Patient is a 70 y/o female with hx of SVT, glaucoma, A-fib presents s/p left TKA.     PT Comments    Pt performed increased mobility and progressed to stair training.  Pt reviewed exercises and PTA issued HEP for continued home use.  PT also given hand out on stair training with RW.  Pt will receive pm visit before d/c home.    Follow Up Recommendations  Home health PT;Supervision for mobility/OOB     Equipment Recommendations  None recommended by PT    Recommendations for Other Services       Precautions / Restrictions Precautions Precautions: Knee Precaution Booklet Issued: No Precaution Comments: Reviewed no pillow under knee and precautions Restrictions Weight Bearing Restrictions: Yes LLE Weight Bearing: Weight bearing as tolerated    Mobility  Bed Mobility Overal bed mobility: Needs Assistance       Supine to sit: Modified independent (Device/Increase time) Sit to supine: Modified independent (Device/Increase time)   General bed mobility comments: Good technique pt assisting LLE.    Transfers Overall transfer level: Needs assistance Equipment used: Rolling walker (2 wheeled) Transfers: Sit to/from Stand Sit to Stand: Supervision         General transfer comment: Cues for hand placement to and from seated surface.  Pt presents with poor eccentric loading from stand to sit.    Ambulation/Gait Ambulation/Gait assistance: Min guard Ambulation Distance (Feet): 200 Feet Assistive device: Rolling walker (2 wheeled) Gait Pattern/deviations: Step-to pattern;Step-through pattern;Trunk flexed;Antalgic;Decreased stride length Gait velocity: decreased   General Gait Details: Cues for L heel strike to improve knee extension in stance phase.  Cues for upper trunk control (adjusted RW to reduce strain in B  shoulders) and forward gaze.  Cues for progression to step through pattern.     Stairs Stairs: Yes   Stair Management: One rail Left;No rails;Step to pattern;Sideways;Backwards;Forwards Number of Stairs: 6 (x4 stairs sideways with L rail.  x2 backwards with RW ascend and with RW forward descending.  ) General stair comments: Pt required cues for sequencing and RW placement.    Wheelchair Mobility    Modified Rankin (Stroke Patients Only)       Balance     Sitting balance-Leahy Scale: Good       Standing balance-Leahy Scale: Fair                      Cognition Arousal/Alertness: Awake/alert Behavior During Therapy: WFL for tasks assessed/performed Overall Cognitive Status: Within Functional Limits for tasks assessed                      Exercises Total Joint Exercises Ankle Circles/Pumps: AROM;Both;10 reps;Supine Quad Sets: AROM;Both;10 reps;Supine Towel Squeeze: AROM;10 reps;Supine Short Arc Quad: AROM;Left;10 reps;Supine Heel Slides: AROM;Left;10 reps;Supine Hip ABduction/ADduction: AROM;Left;10 reps;Supine Straight Leg Raises: AROM;Left;10 reps;Supine Goniometric ROM: Will measure this pm before d/c home.      General Comments        Pertinent Vitals/Pain Pain Assessment: 0-10 Pain Score: 6  Pain Location: L knee Pain Descriptors / Indicators: Aching;Sore;Grimacing;Guarding Pain Intervention(s): Monitored during session;Repositioned;Ice applied    Home Living                      Prior Function            PT Goals (current goals  can now be found in the care plan section) Acute Rehab PT Goals Patient Stated Goal: to be able to play with my grand kids Potential to Achieve Goals: Good Progress towards PT goals: Progressing toward goals    Frequency    7X/week      PT Plan Current plan remains appropriate    Co-evaluation             End of Session Equipment Utilized During Treatment: Gait belt Activity  Tolerance: Patient tolerated treatment well Patient left: in bed;with call bell/phone within reach     Time: 1006-1040 PT Time Calculation (min) (ACUTE ONLY): 34 min  Charges:  $Gait Training: 8-22 mins $Therapeutic Exercise: 8-22 mins                    G Codes:      Florestine Avers 04/04/16, 10:43 AM  Joycelyn Rua, PTA pager [phone]

## 2016-03-24 NOTE — Progress Notes (Signed)
Patient received discharge instructions and prescriptions. Patient verbalized understanding. IV removed.

## 2016-03-25 DIAGNOSIS — I10 Essential (primary) hypertension: Secondary | ICD-10-CM | POA: Diagnosis not present

## 2016-03-25 DIAGNOSIS — Z96652 Presence of left artificial knee joint: Secondary | ICD-10-CM | POA: Diagnosis not present

## 2016-03-25 DIAGNOSIS — Z7982 Long term (current) use of aspirin: Secondary | ICD-10-CM | POA: Diagnosis not present

## 2016-03-25 DIAGNOSIS — Z471 Aftercare following joint replacement surgery: Secondary | ICD-10-CM | POA: Diagnosis not present

## 2016-03-25 DIAGNOSIS — I4891 Unspecified atrial fibrillation: Secondary | ICD-10-CM | POA: Diagnosis not present

## 2016-03-25 NOTE — Op Note (Signed)
NAMEMarland Morales  Angela, Morales NO.:  [id number]  MEDICAL RECORD NO.:  [id number]  LOCATION:  5N22C                        FACILITY:  [facility]  PHYSICIAN:  Alta Corning, M.D.   DATE OF BIRTH:  1946-05-28  DATE OF PROCEDURE:  03/22/2016 DATE OF DISCHARGE:  03/24/2016                              OPERATIVE REPORT   PREOPERATIVE DIAGNOSIS:  End-stage degenerative joint disease, left knee.  POSTOPERATIVE DIAGNOSIS:  End-stage degenerative joint disease, left knee.  PROCEDURE: 1. Left total knee replacement with an Attune system size 4 narrow     femur, size 4 tibia, 6 mm bridging bearing, and a 38 mm, 35 mm     polyethylene offset patella. 2. Lateral retinacular release.  SURGEON:  Alta Corning, MD  ASSISTANT:  Modena Slater.  ANESTHESIA:  Spinal.  BRIEF HISTORY:  Angela Morales is a 70 year old female with a long history of significant complaints of bilateral knee pain, left far greater than right.  Her x-ray showed that she had bone-on-bone changes in the lateral compartment and she was having night pain and light activity pain and after failure of all conservative care, she was taken to the operating room for left total knee replacement.  The patient had failed conservative care including injection therapy, activity modification, viscosupplementation.  DESCRIPTION OF PROCEDURE:  The patient was taken to the operating room. After adequate anesthesia was obtained with spinal anesthetic, patient placed supine on the operating table.  The left leg was prepped and draped in usual sterile fashion.  Following this, the leg was exsanguinated.  Blood pressure tourniquet inflated to 300 mmHg. Following this, a midline incision was made in subcutaneous tissue, carried down to the level of extensor mechanism.  The medial parapatellar arthrotomy was undertaken.  Medial lateral meniscus removed.  Retropatellar fat pad was removed.  Synovium on the anterior aspect of the femur, and  the anterior and posterior cruciates, at this point, intramedullary pilot hole was drilled in the femur.  The 5 degree valgus cut was made distally to allow for access to the canal and a 9 mm of distal bone was resected at this point.  Following this, anterior and posterior cuts were made, chamfers and box.  After the femur was sized to a 4 and at this point, attention was turned to the tibia.  Tibia is sized to a 4, first tibia was cut perpendicular to its long axis with an extramedullary guide, it was then sized to a 4 inch drilled and keeled. Following this, trials were put in place.  It becomes clear that 4 narrow will be more appropriate than 4 and this was chosen at this time, 5 mm bearing put in place, little bit loose went with a 6, felt much better for excellent range of motion and stability, went to the patella, cut it down to a level of 13 mm and once that was completed, the 35 poly was chosen and lugs were drilled for the patella lugs drilled in the femur.  All trials were then removed.  The knee was copiously and thoroughly lavaged, pulsatile lavage, irrigation and suctioned dry.  The final components were then cemented in place size 4 tibia, size 4  narrow femur, 6 mm bridging bearing trial was placed and a 35 all poly patella was placed and held with a clamp.  All excess bone cement was removed and the cement allowed to completely harden.  Once hardened, the tourniquet was let down.  All bleeding was controlled with electrocautery.  60 mL of Exparel, 20 mL Marcaine, and 20 mL saline are instilled throughout the synovial reflection of the knee for postoperative pain control.  Following this, attention was turned towards placing trial 6 checking and excellent range of motion, stability were achieved.  The final 6 was open and the final poly was placed.  At this point, the knee was put through a range of motion. Excellent stability and range of motion were achieved.  She did  have some tightness in the lateral retinaculum and because of that, I felt that a lateral retinacular release was appropriate and a lateral retinacular release was performed at this point.  Once this was completed, the wounds were thoroughly irrigated, pulsatile lavage, irrigation and suctioned dry.  The medial parapatellar arthrotomy was closed with 1 Vicryl running, the skin with 0 and 2-0 Vicryl, and 3-0 Monocryl subcuticular.  Benzoin and Steri-Strips were applied.  Sterile compressive dressing was applied and the patient was taken to the recovery where she was noted to be in satisfactory condition.  Estimated blood loss for procedure is minimal.     Alta Corning, M.D.     Corliss Skains  D:  03/24/2016  T:  03/25/2016  Job:  [id number]

## 2016-03-29 DIAGNOSIS — I10 Essential (primary) hypertension: Secondary | ICD-10-CM | POA: Diagnosis not present

## 2016-03-29 DIAGNOSIS — Z96652 Presence of left artificial knee joint: Secondary | ICD-10-CM | POA: Diagnosis not present

## 2016-03-29 DIAGNOSIS — Z471 Aftercare following joint replacement surgery: Secondary | ICD-10-CM | POA: Diagnosis not present

## 2016-03-29 DIAGNOSIS — I4891 Unspecified atrial fibrillation: Secondary | ICD-10-CM | POA: Diagnosis not present

## 2016-03-29 DIAGNOSIS — Z7982 Long term (current) use of aspirin: Secondary | ICD-10-CM | POA: Diagnosis not present

## 2016-03-30 DIAGNOSIS — I4891 Unspecified atrial fibrillation: Secondary | ICD-10-CM | POA: Diagnosis not present

## 2016-03-30 DIAGNOSIS — Z7982 Long term (current) use of aspirin: Secondary | ICD-10-CM | POA: Diagnosis not present

## 2016-03-30 DIAGNOSIS — I10 Essential (primary) hypertension: Secondary | ICD-10-CM | POA: Diagnosis not present

## 2016-03-30 DIAGNOSIS — Z471 Aftercare following joint replacement surgery: Secondary | ICD-10-CM | POA: Diagnosis not present

## 2016-03-30 DIAGNOSIS — Z96652 Presence of left artificial knee joint: Secondary | ICD-10-CM | POA: Diagnosis not present

## 2016-04-01 DIAGNOSIS — Z471 Aftercare following joint replacement surgery: Secondary | ICD-10-CM | POA: Diagnosis not present

## 2016-04-01 DIAGNOSIS — Z7982 Long term (current) use of aspirin: Secondary | ICD-10-CM | POA: Diagnosis not present

## 2016-04-01 DIAGNOSIS — I4891 Unspecified atrial fibrillation: Secondary | ICD-10-CM | POA: Diagnosis not present

## 2016-04-01 DIAGNOSIS — Z96652 Presence of left artificial knee joint: Secondary | ICD-10-CM | POA: Diagnosis not present

## 2016-04-01 DIAGNOSIS — I10 Essential (primary) hypertension: Secondary | ICD-10-CM | POA: Diagnosis not present

## 2016-04-05 DIAGNOSIS — I4891 Unspecified atrial fibrillation: Secondary | ICD-10-CM | POA: Diagnosis not present

## 2016-04-05 DIAGNOSIS — I10 Essential (primary) hypertension: Secondary | ICD-10-CM | POA: Diagnosis not present

## 2016-04-05 DIAGNOSIS — Z96652 Presence of left artificial knee joint: Secondary | ICD-10-CM | POA: Diagnosis not present

## 2016-04-05 DIAGNOSIS — M1712 Unilateral primary osteoarthritis, left knee: Secondary | ICD-10-CM | POA: Diagnosis not present

## 2016-04-05 DIAGNOSIS — Z7982 Long term (current) use of aspirin: Secondary | ICD-10-CM | POA: Diagnosis not present

## 2016-04-05 DIAGNOSIS — Z471 Aftercare following joint replacement surgery: Secondary | ICD-10-CM | POA: Diagnosis not present

## 2016-04-06 DIAGNOSIS — I4891 Unspecified atrial fibrillation: Secondary | ICD-10-CM | POA: Diagnosis not present

## 2016-04-06 DIAGNOSIS — Z96652 Presence of left artificial knee joint: Secondary | ICD-10-CM | POA: Diagnosis not present

## 2016-04-06 DIAGNOSIS — I10 Essential (primary) hypertension: Secondary | ICD-10-CM | POA: Diagnosis not present

## 2016-04-06 DIAGNOSIS — Z471 Aftercare following joint replacement surgery: Secondary | ICD-10-CM | POA: Diagnosis not present

## 2016-04-06 DIAGNOSIS — Z7982 Long term (current) use of aspirin: Secondary | ICD-10-CM | POA: Diagnosis not present

## 2016-04-08 DIAGNOSIS — Z471 Aftercare following joint replacement surgery: Secondary | ICD-10-CM | POA: Diagnosis not present

## 2016-04-08 DIAGNOSIS — I10 Essential (primary) hypertension: Secondary | ICD-10-CM | POA: Diagnosis not present

## 2016-04-08 DIAGNOSIS — I4891 Unspecified atrial fibrillation: Secondary | ICD-10-CM | POA: Diagnosis not present

## 2016-04-08 DIAGNOSIS — Z7982 Long term (current) use of aspirin: Secondary | ICD-10-CM | POA: Diagnosis not present

## 2016-04-08 DIAGNOSIS — Z96652 Presence of left artificial knee joint: Secondary | ICD-10-CM | POA: Diagnosis not present

## 2016-04-12 DIAGNOSIS — M25662 Stiffness of left knee, not elsewhere classified: Secondary | ICD-10-CM | POA: Diagnosis not present

## 2016-04-12 DIAGNOSIS — Z96652 Presence of left artificial knee joint: Secondary | ICD-10-CM | POA: Diagnosis not present

## 2016-04-12 DIAGNOSIS — M25562 Pain in left knee: Secondary | ICD-10-CM | POA: Diagnosis not present

## 2016-04-14 DIAGNOSIS — Z96652 Presence of left artificial knee joint: Secondary | ICD-10-CM | POA: Diagnosis not present

## 2016-04-14 DIAGNOSIS — M25562 Pain in left knee: Secondary | ICD-10-CM | POA: Diagnosis not present

## 2016-04-14 DIAGNOSIS — M25662 Stiffness of left knee, not elsewhere classified: Secondary | ICD-10-CM | POA: Diagnosis not present

## 2016-04-20 DIAGNOSIS — M25662 Stiffness of left knee, not elsewhere classified: Secondary | ICD-10-CM | POA: Diagnosis not present

## 2016-04-20 DIAGNOSIS — Z96652 Presence of left artificial knee joint: Secondary | ICD-10-CM | POA: Diagnosis not present

## 2016-04-20 DIAGNOSIS — M25562 Pain in left knee: Secondary | ICD-10-CM | POA: Diagnosis not present

## 2016-04-22 DIAGNOSIS — M25562 Pain in left knee: Secondary | ICD-10-CM | POA: Diagnosis not present

## 2016-04-22 DIAGNOSIS — Z96652 Presence of left artificial knee joint: Secondary | ICD-10-CM | POA: Diagnosis not present

## 2016-04-22 DIAGNOSIS — M25662 Stiffness of left knee, not elsewhere classified: Secondary | ICD-10-CM | POA: Diagnosis not present

## 2016-04-26 DIAGNOSIS — Z96652 Presence of left artificial knee joint: Secondary | ICD-10-CM | POA: Diagnosis not present

## 2016-04-26 DIAGNOSIS — Z471 Aftercare following joint replacement surgery: Secondary | ICD-10-CM | POA: Diagnosis not present

## 2016-04-26 DIAGNOSIS — M1712 Unilateral primary osteoarthritis, left knee: Secondary | ICD-10-CM | POA: Diagnosis not present

## 2016-04-27 DIAGNOSIS — M25662 Stiffness of left knee, not elsewhere classified: Secondary | ICD-10-CM | POA: Diagnosis not present

## 2016-04-27 DIAGNOSIS — M25562 Pain in left knee: Secondary | ICD-10-CM | POA: Diagnosis not present

## 2016-04-27 DIAGNOSIS — Z96652 Presence of left artificial knee joint: Secondary | ICD-10-CM | POA: Diagnosis not present

## 2016-04-29 DIAGNOSIS — M25662 Stiffness of left knee, not elsewhere classified: Secondary | ICD-10-CM | POA: Diagnosis not present

## 2016-04-29 DIAGNOSIS — M25562 Pain in left knee: Secondary | ICD-10-CM | POA: Diagnosis not present

## 2016-04-29 DIAGNOSIS — Z96652 Presence of left artificial knee joint: Secondary | ICD-10-CM | POA: Diagnosis not present

## 2016-05-04 DIAGNOSIS — Z96652 Presence of left artificial knee joint: Secondary | ICD-10-CM | POA: Diagnosis not present

## 2016-05-04 DIAGNOSIS — M25662 Stiffness of left knee, not elsewhere classified: Secondary | ICD-10-CM | POA: Diagnosis not present

## 2016-05-04 DIAGNOSIS — M25562 Pain in left knee: Secondary | ICD-10-CM | POA: Diagnosis not present

## 2016-05-06 DIAGNOSIS — M25562 Pain in left knee: Secondary | ICD-10-CM | POA: Diagnosis not present

## 2016-05-06 DIAGNOSIS — M25662 Stiffness of left knee, not elsewhere classified: Secondary | ICD-10-CM | POA: Diagnosis not present

## 2016-05-06 DIAGNOSIS — Z96652 Presence of left artificial knee joint: Secondary | ICD-10-CM | POA: Diagnosis not present

## 2016-05-11 DIAGNOSIS — Z96652 Presence of left artificial knee joint: Secondary | ICD-10-CM | POA: Diagnosis not present

## 2016-05-11 DIAGNOSIS — M25662 Stiffness of left knee, not elsewhere classified: Secondary | ICD-10-CM | POA: Diagnosis not present

## 2016-05-11 DIAGNOSIS — M25562 Pain in left knee: Secondary | ICD-10-CM | POA: Diagnosis not present

## 2016-05-13 DIAGNOSIS — Z96652 Presence of left artificial knee joint: Secondary | ICD-10-CM | POA: Diagnosis not present

## 2016-05-13 DIAGNOSIS — M25562 Pain in left knee: Secondary | ICD-10-CM | POA: Diagnosis not present

## 2016-05-13 DIAGNOSIS — M25662 Stiffness of left knee, not elsewhere classified: Secondary | ICD-10-CM | POA: Diagnosis not present

## 2016-05-24 DIAGNOSIS — M25562 Pain in left knee: Secondary | ICD-10-CM | POA: Diagnosis not present

## 2016-07-21 ENCOUNTER — Encounter: Payer: Self-pay | Admitting: Gastroenterology

## 2016-07-21 ENCOUNTER — Other Ambulatory Visit (INDEPENDENT_AMBULATORY_CARE_PROVIDER_SITE_OTHER): Payer: Medicare Other

## 2016-07-21 ENCOUNTER — Ambulatory Visit (INDEPENDENT_AMBULATORY_CARE_PROVIDER_SITE_OTHER): Payer: Medicare Other | Admitting: Gastroenterology

## 2016-07-21 ENCOUNTER — Other Ambulatory Visit: Payer: Self-pay | Admitting: Gastroenterology

## 2016-07-21 VITALS — BP 94/62 | HR 78 | Ht 64.75 in | Wt 128.2 lb

## 2016-07-21 DIAGNOSIS — R1032 Left lower quadrant pain: Secondary | ICD-10-CM

## 2016-07-21 DIAGNOSIS — R634 Abnormal weight loss: Secondary | ICD-10-CM | POA: Diagnosis not present

## 2016-07-21 DIAGNOSIS — K921 Melena: Secondary | ICD-10-CM

## 2016-07-21 DIAGNOSIS — R197 Diarrhea, unspecified: Secondary | ICD-10-CM

## 2016-07-21 LAB — CBC WITH DIFFERENTIAL/PLATELET
Basophils Absolute: 0 10*3/uL (ref 0.0–0.1)
Basophils Relative: 0.3 % (ref 0.0–3.0)
EOS PCT: 1 % (ref 0.0–5.0)
Eosinophils Absolute: 0.1 10*3/uL (ref 0.0–0.7)
HCT: 37.7 % (ref 36.0–46.0)
Hemoglobin: 12.1 g/dL (ref 12.0–15.0)
LYMPHS ABS: 2.6 10*3/uL (ref 0.7–4.0)
Lymphocytes Relative: 20.6 % (ref 12.0–46.0)
MCHC: 32 g/dL (ref 30.0–36.0)
MCV: 81.7 fl (ref 78.0–100.0)
MONOS PCT: 8.9 % (ref 3.0–12.0)
Monocytes Absolute: 1.1 10*3/uL — ABNORMAL HIGH (ref 0.1–1.0)
NEUTROS ABS: 8.8 10*3/uL — AB (ref 1.4–7.7)
NEUTROS PCT: 69.2 % (ref 43.0–77.0)
PLATELETS: 346 10*3/uL (ref 150.0–400.0)
RBC: 4.61 Mil/uL (ref 3.87–5.11)
RDW: 18.1 % — AB (ref 11.5–15.5)
WBC: 12.8 10*3/uL — ABNORMAL HIGH (ref 4.0–10.5)

## 2016-07-21 LAB — COMPREHENSIVE METABOLIC PANEL
ALK PHOS: 75 U/L (ref 39–117)
ALT: 17 U/L (ref 0–35)
AST: 25 U/L (ref 0–37)
Albumin: 4 g/dL (ref 3.5–5.2)
BUN: 10 mg/dL (ref 6–23)
CHLORIDE: 104 meq/L (ref 96–112)
CO2: 30 meq/L (ref 19–32)
Calcium: 9.5 mg/dL (ref 8.4–10.5)
Creatinine, Ser: 0.54 mg/dL (ref 0.40–1.20)
GFR: 118.22 mL/min (ref >60.00)
GLUCOSE: 97 mg/dL (ref 70–99)
POTASSIUM: 4.5 meq/L (ref 3.5–5.1)
Sodium: 139 mEq/L (ref 135–145)
Total Bilirubin: 0.3 mg/dL (ref 0.2–1.2)
Total Protein: 7.8 g/dL (ref 6.0–8.3)

## 2016-07-21 LAB — TSH: TSH: 1 u[IU]/mL (ref 0.35–4.50)

## 2016-07-21 LAB — IGA: IgA: 409 mg/dL — ABNORMAL HIGH (ref 68–378)

## 2016-07-21 NOTE — Patient Instructions (Addendum)
If you are age 72 or older, your body mass index should be between 23-30. Your Body mass index is 21.51 kg/m. If this is out of the aforementioned range listed, please consider follow up with your Primary Care Provider.  If you are age 11 or younger, your body mass index should be between 19-25. Your Body mass index is 21.51 kg/m. If this is out of the aformentioned range listed, please consider follow up with your Primary Care Provider.   Your physician has requested that you go to the basement for lab work before leaving today.  Depending on the stool study results. If the C.diff is negative we will schedule you for a colonoscopy.  Thank you for choosing [facility]  Dr Amada Jupiter III

## 2016-07-21 NOTE — Progress Notes (Signed)
[facility] Consult Note:  History: Angela Morales 07/21/2016  Referring physician: Thressa Sheller, MD  Reason for consult/chief complaint: Diarrhea (X 3 months)   Subjective  HPI:  This is a 71 year old woman last seen by Dr. Olevia Morales in April 2016, at which time a colonoscopy was performed. It was a normal study. Angela Morales was well until late September of this year when she had a left total knee replacement. Within 2 weeks she started having crampy diffuse but predominantly lower abdominal pain with multiple loose nonbloody stools per day. She initially thought it may have been from some perioperative antibiotics and that it would resolve, but it has continued since then. She has between 5 and 8 loose BMs per day, and has had no formed stool since mid September. She has chronic bloating and gas and has had no improvement on changes in diet or trial of probiotics. She has lost at least 10 pounds in that period of time which she attributes to decreased food intake due to the digestive symptoms. She is not seen her primary care physician or had any workup for these symptoms.  She has paroxysmal atrial fibrillation, and I reviewed her last cardiologist note from July 2017. Point they did not feel of before meals was warranted, but had planned to revisited after her knee replacement. In fact, they were planning a possible EP study for ablation and hopefully obviate the need for anticoagulation.  ROS:  Review of Systems  Constitutional: Negative for appetite change and unexpected weight change.  HENT: Negative for mouth sores and voice change.   Eyes: Negative for pain and redness.  Respiratory: Negative for cough and shortness of breath.   Cardiovascular: Negative for chest pain and palpitations.  Genitourinary: Negative for dysuria and hematuria.  Musculoskeletal: Positive for arthralgias. Negative for myalgias.  Skin: Negative for pallor and rash.  Neurological: Negative for  weakness and headaches.  Hematological: Negative for adenopathy.     Past Medical History: Past Medical History:  Diagnosis Date  . Abnormal uterine bleeding    on HRT  . Allergy    seasonal  . Arthritis   . Atrial fibrillation ([facility])   . DDD (degenerative disc disease)   . Dysrhythmia   . Glaucoma   . Osteoarthritis of both knees   . PONV (postoperative nausea and vomiting)   . SVT (supraventricular tachycardia) ([facility])      Past Surgical History: Past Surgical History:  Procedure Laterality Date  . APPENDECTOMY    . COLONOSCOPY    . FOOT FRACTURE SURGERY  10/2013  . TONSILLECTOMY AND ADENOIDECTOMY    . TOTAL KNEE ARTHROPLASTY Left 03/22/2016   Procedure: TOTAL KNEE ARTHROPLASTY;  Surgeon: Angela Leitz, MD;  Location: [facility];  Service: Orthopedics;  Laterality: Left;  . TUBAL LIGATION  1981  . VAGINAL HYSTERECTOMY  04/20/05   prolapse, adenomyosis     Family History: Family History  Problem Relation Age of Onset  . Osteoporosis Mother   . Stroke Mother     mini  . Prostate cancer Father   . Heart disease Brother 46    shunt put in heart  . Osteoarthritis Maternal Grandmother   . Bipolar disorder Son   . Liver disease Son   . Breast cancer Cousin     1st cousin  . Colon cancer Neg Hx     Social History: Social History   Social History  . Marital status: Married    Spouse name: N/A  . Number of children: 3  .  Years of education: N/A   Occupational History  . retired    Social History Main Topics  . Smoking status: Never Smoker  . Smokeless tobacco: Never Used  . Alcohol use 2.4 - 3.6 oz/week    4 - 6 Glasses of wine per week     Comment: occasional  . Drug use: No  . Sexual activity: Yes    Partners: Male    Birth control/ protection: Surgical     Comment: [facility]   Other Topics Concern  . None   Social History Narrative  . None    Allergies: Allergies  Allergen Reactions  . Cefdinir Rash    Outpatient Meds: Current Outpatient  Prescriptions  Medication Sig Dispense Refill  . aspirin 81 MG chewable tablet Chew by mouth daily.    . Cholecalciferol (VITAMIN D PO) Take 2,000 Int'l Units by mouth daily.     Marland Kitchen loratadine (CLARITIN) 10 MG tablet Take 10 mg by mouth daily.    Marland Kitchen LOTEMAX 0.5 % ophthalmic suspension Place 1 drop into both eyes daily.     . metoprolol tartrate (LOPRESSOR) 25 MG tablet Take 12.5 mg by mouth 2 (two) times daily.     . naproxen sodium (ANAPROX) 220 MG tablet Take 1 tablet (220 mg total) by mouth 2 (two) times daily as needed (pain). 20 tablet 0  . psyllium (METAMUCIL) 58.6 % packet Take 1 packet by mouth daily.    . timolol (BETIMOL) 0.25 % ophthalmic solution Place 1 drop into both eyes 2 (two) times daily.     No current facility-administered medications for this visit.       ___________________________________________________________________ Objective   Exam:  BP 94/62   Pulse 78   Ht 5' 4.75" (1.645 m)   Wt 128 lb 4 oz (58.2 kg)   LMP 04/20/2005   BMI 21.51 kg/m    General: this is a(n) Thin well-appearing elderly woman, no muscle wasting   Eyes: sclera anicteric, no redness  ENT: oral mucosa moist without lesions, no cervical or supraclavicular lymphadenopathy, good dentition  CV: RRR without murmur, S1/S2, no JVD, no peripheral edema  Resp: clear to auscultation bilaterally, normal RR and effort noted  GI: soft, no tenderness, with active bowel sounds. No guarding or palpable organomegaly noted.  Skin; warm and dry, no rash or jaundice noted  Neuro: awake, alert and oriented x 3. Normal gross motor function and fluent speech  Labs:   normal preoperative CMP and CBC on 03/08/2016  Assessment: Encounter Diagnoses  Name Primary?  . Diarrhea, unspecified type Yes  . Abnormal loss of weight   . LLQ abdominal pain    Given the overall scenario, I am primarily concerned about C. difficile, less likely microscopic colitis or a malabsorptive syndrome.  Plan:  C.  difficile PCR and toxin  CBC, CMP, TSH and TTG antibody   I will be out of the office for the next few days. We have a doctor of the day who can attend to a positive C. difficile toxin if that occurs. I think she would need metronidazole 500 mg 3 times daily 10 days. If her C. difficile toxin is negative, I would like her to have a colonoscopy next week.  Thank you for the courtesy of this consult.  Please call me with any questions or concerns.  Nelida Meuse III  CCThressa Sheller, MD

## 2016-07-22 ENCOUNTER — Telehealth: Payer: Self-pay | Admitting: Gastroenterology

## 2016-07-22 LAB — OVA AND PARASITE EXAMINATION: OP: NONE SEEN

## 2016-07-22 LAB — TISSUE TRANSGLUTAMINASE, IGA: Tissue Transglutaminase Ab, IgA: 100 U/mL — ABNORMAL HIGH (ref <4)

## 2016-07-22 LAB — C. DIFFICILE GDH AND TOXIN A/B
C. difficile GDH: DETECTED — AB
C. difficile Toxin A/B: NOT DETECTED

## 2016-07-22 LAB — CLOSTRIDIUM DIFFICILE BY PCR: Toxigenic C. Difficile by PCR: NOT DETECTED

## 2016-07-23 ENCOUNTER — Telehealth: Payer: Self-pay

## 2016-07-23 ENCOUNTER — Other Ambulatory Visit: Payer: Self-pay

## 2016-07-23 MED ORDER — NA SULFATE-K SULFATE-MG SULF 17.5-3.13-1.6 GM/177ML PO SOLN: 1.0000 | Freq: Once | ORAL | 0 refills | Status: AC

## 2016-07-23 NOTE — Telephone Encounter (Signed)
-----   Message from Ruffin Frederick, MD sent at 07/22/2016  5:52 PM EST ----- As a follow up I don't know if I specifically said in my other message, but I would not treat for C Diff based on stool study, but proceed with colonoscopy   ----- Message ----- From: Leverne Humbles, RN Sent: 07/22/2016   4:18 PM To: Ruffin Frederick, MD  This is a [name] patient, can you look at the C diff results. He left instructions in his last note about treatment or next step. Thank You!! Raynelle Fanning

## 2016-07-23 NOTE — Telephone Encounter (Signed)
-----   Message from Ruffin Frederick, MD sent at 07/22/2016  5:41 PM EST ----- Angela Morales,  This patient has the following stool test results: C diff GDH positive, C diff toxin negative which together is an indeterminant result. The GDH is nonspecific and does not distinguish between toxigenic and non-toxigenic strains of C diff. However C diff PCR is negative making active C Diff infection unlikely to be causing her symptoms. In this light I would proceed with scheduling a colonoscopy per Dr. Myrtie Neither' note.  Saunders Glance I'm covering today, hope you agree with this plan.  Brett Canales  ----- Message ----- From: Leverne Humbles, RN Sent: 07/22/2016   4:18 PM To: Ruffin Frederick, MD  This is a [name] patient, can you look at the C diff results. He left instructions in his last note about treatment or next step. Thank You!! Raynelle Fanning

## 2016-07-23 NOTE — Telephone Encounter (Signed)
Patient notified of results, colonoscopy is scheduled for 07/28/16. She will come by today to sign consent and pick up prep instructions.

## 2016-07-23 NOTE — Telephone Encounter (Signed)
See other notes

## 2016-07-26 LAB — CLOSTRIDIUM DIFFICILE BY PCR

## 2016-07-28 ENCOUNTER — Ambulatory Visit (AMBULATORY_SURGERY_CENTER): Payer: Medicare Other | Admitting: Gastroenterology

## 2016-07-28 ENCOUNTER — Encounter: Payer: Self-pay | Admitting: Gastroenterology

## 2016-07-28 VITALS — BP 110/55 | HR 52 | Temp 95.5°F | Resp 11 | Ht 64.0 in | Wt 128.0 lb

## 2016-07-28 DIAGNOSIS — Z1211 Encounter for screening for malignant neoplasm of colon: Secondary | ICD-10-CM | POA: Diagnosis not present

## 2016-07-28 DIAGNOSIS — R634 Abnormal weight loss: Secondary | ICD-10-CM | POA: Diagnosis not present

## 2016-07-28 DIAGNOSIS — R197 Diarrhea, unspecified: Secondary | ICD-10-CM | POA: Diagnosis present

## 2016-07-28 DIAGNOSIS — K3189 Other diseases of stomach and duodenum: Secondary | ICD-10-CM | POA: Diagnosis not present

## 2016-07-28 MED ORDER — SODIUM CHLORIDE 0.9 % IV SOLN: 500.0000 mL | INTRAVENOUS | Status: DC

## 2016-07-28 NOTE — Op Note (Signed)
[place] Patient Name: Angela Morales Procedure Date: 07/28/2016 8:00 AM MRN: [id number] Endoscopist: Mallie Mussel L. Loletha Carrow , MD Age: 71 Referring MD:  Date of Birth: 16-Jul-1945 Gender: Female Account #: [id number] Procedure:                Colonoscopy Indications:              Chronic diarrhea, Weight loss Medicines:                Monitored Anesthesia Care Procedure:                Pre-Anesthesia Assessment:                           - Prior to the procedure, a History and Physical                            was performed, and patient medications and                            allergies were reviewed. The patient's tolerance of                            previous anesthesia was also reviewed. The risks                            and benefits of the procedure and the sedation                            options and risks were discussed with the patient.                            All questions were answered, and informed consent                            was obtained. Anticoagulants: The patient has taken                            aspirin. It was decided not to withhold this                            medication prior to the procedure. ASA Grade                            Assessment: II - A patient with mild systemic                            disease. After reviewing the risks and benefits,                            the patient was deemed in satisfactory condition to                            undergo the procedure.  After obtaining informed consent, the colonoscope                            was passed under direct vision. Throughout the                            procedure, the patient's blood pressure, pulse, and                            oxygen saturations were monitored continuously. The                            Model PCF-H190L [id number]) scope was introduced                            through the anus and advanced to the the cecum,                        identified by appendiceal orifice and ileocecal                            valve. The colonoscopy was performed without                            difficulty. The patient tolerated the procedure                            well. The quality of the bowel preparation was                            excellent. The ileocecal valve, appendiceal                            orifice, and rectum were photographed. The quality                            of the bowel preparation was evaluated using the                            BBPS [facility] Bowel Preparation Scale) with scores                            of: Right Colon = 3, Transverse Colon = 3 and Left                            Colon = 3 (entire mucosa seen well with no residual                            staining, small fragments of stool or opaque                            liquid). The total BBPS score equals 9. The bowel  preparation used was SUPREP. Scope In: 8:16:11 AM Scope Out: 8:30:38 AM Scope Withdrawal Time: 0 hours 8 minutes 35 seconds  Total Procedure Duration: 0 hours 14 minutes 27 seconds  Findings:                 The digital rectal exam findings include decreased                            sphincter tone.                           Multiple medium-mouthed diverticula were found in                            the left colon. There was associated tortuosity and                            redundancy.                           Normal mucosa was found in the entire colon.                            Biopsies for histology were taken with a cold                            forceps from the right colon and left colon for                            evaluation of microscopic colitis.                           No polyps were seen.                           The retroflexed view of the distal rectum and anal                            verge was normal and showed no anal or rectal                             abnormalities. Complications:            No immediate complications. Estimated Blood Loss:     Estimated blood loss was minimal. Impression:               - Decreased sphincter tone found on digital rectal                            exam.                           - Normal mucosa in the entire examined colon.                            Biopsied.                           -  The distal rectum and anal verge are normal on                            retroflexion view. Recommendation:           - Patient has a contact number available for                            emergencies. The signs and symptoms of potential                            delayed complications were discussed with the                            patient. Return to normal activities tomorrow.                            Written discharge instructions were provided to the                            patient.                           - Resume previous diet.                           - Continue present medications.                           - Await pathology results.                           - No recommendation at this time regarding future                            screening colonoscopy due to age. Hence Derrick L. Loletha Carrow, MD 07/28/2016 8:41:14 AM This report has been signed electronically.

## 2016-07-28 NOTE — Progress Notes (Signed)
Called to room to assist during endoscopic procedure.  Patient ID and intended procedure confirmed with present staff. Received instructions for my participation in the procedure from the performing physician.  

## 2016-07-28 NOTE — Progress Notes (Signed)
Teeth unchanged after procedure.A and O x3. Report to RN. Tolerated MAC anesthesia well. 

## 2016-07-28 NOTE — Patient Instructions (Signed)
YOU HAD AN ENDOSCOPIC PROCEDURE TODAY AT THE [facility]:   Refer to the procedure report that was given to you for any specific questions about what was found during the examination.  If the procedure report does not answer your questions, please call your gastroenterologist to clarify.  If you requested that your care partner not be given the details of your procedure findings, then the procedure report has been included in a sealed envelope for you to review at your convenience later.  YOU SHOULD EXPECT: Some feelings of bloating in the abdomen. Passage of more gas than usual.  Walking can help get rid of the air that was put into your GI tract during the procedure and reduce the bloating. If you had a lower endoscopy (such as a colonoscopy or flexible sigmoidoscopy) you may notice spotting of blood in your stool or on the toilet paper. If you underwent a bowel prep for your procedure, you may not have a normal bowel movement for a few days.  Please Note:  You might notice some irritation and congestion in your nose or some drainage.  This is from the oxygen used during your procedure.  There is no need for concern and it should clear up in a day or so.  SYMPTOMS TO REPORT IMMEDIATELY:   Following lower endoscopy (colonoscopy or flexible sigmoidoscopy):  Excessive amounts of blood in the stool  Significant tenderness or worsening of abdominal pains  Swelling of the abdomen that is new, acute  Fever of 100F or higher   Following upper endoscopy (EGD)  Vomiting of blood or coffee ground material  New chest pain or pain under the shoulder blades  Painful or persistently difficult swallowing  New shortness of breath  Fever of 100F or higher  Black, tarry-looking stools  For urgent or emergent issues, a gastroenterologist can be reached at any hour by calling [phone].   DIET:  We do recommend a small meal at first, but then you may proceed to your regular diet.  Drink  plenty of fluids but you should avoid alcoholic beverages for 24 hours.  ACTIVITY:  You should plan to take it easy for the rest of today and you should NOT DRIVE or use heavy machinery until tomorrow (because of the sedation medicines used during the test).    FOLLOW UP: Our staff will call the number listed on your records the next business day following your procedure to check on you and address any questions or concerns that you may have regarding the information given to you following your procedure. If we do not reach you, we will leave a message.  However, if you are feeling well and you are not experiencing any problems, there is no need to return our call.  We will assume that you have returned to your regular daily activities without incident.  If any biopsies were taken you will be contacted by phone or by letter within the next 1-3 weeks.  Please call us at [phone] if you have not heard about the biopsies in 3 weeks.    SIGNATURES/CONFIDENTIALITY: You and/or your care partner have signed paperwork which will be entered into your electronic medical record.  These signatures attest to the fact that that the information above on your After Visit Summary has been reviewed and is understood.  Full responsibility of the confidentiality of this discharge information lies with you and/or your care-partner.    Resume medications. Information given on diverticulosis. 

## 2016-07-28 NOTE — Op Note (Signed)
[facility] Patient Name: Angela Morales Procedure Date: 07/28/2016 8:00 AM MRN: [id number] Endoscopist: Mallie Mussel L. Loletha Carrow , MD Age: 71 Referring MD:  Date of Birth: 08/13/1945 Gender: Female Account #: [id number] Procedure:                Upper GI endoscopy Indications:              Endoscopy to assess diarrhea in patient suspected                            of having celiac disease (highly positive tTG                            antibody), Weight loss Medicines:                Monitored Anesthesia Care Procedure:                Pre-Anesthesia Assessment:                           - Prior to the procedure, a History and Physical                            was performed, and patient medications and                            allergies were reviewed. The patient's tolerance of                            previous anesthesia was also reviewed. The risks                            and benefits of the procedure and the sedation                            options and risks were discussed with the patient.                            All questions were answered, and informed consent                            was obtained. Anticoagulants: The patient has taken                            aspirin. It was decided not to withhold this                            medication prior to the procedure. ASA Grade                            Assessment: II - A patient with mild systemic                            disease. After reviewing the risks and benefits,  the patient was deemed in satisfactory condition to                            undergo the procedure.                           After obtaining informed consent, the endoscope was                            passed under direct vision. Throughout the                            procedure, the patient's blood pressure, pulse, and                            oxygen saturations were monitored continuously. The                    Model GIF-[id number]) scope was introduced                            through the mouth, and advanced to the second part                            of duodenum. The upper GI endoscopy was                            accomplished without difficulty. The patient                            tolerated the procedure well. Scope In: Scope Out: Findings:                 The esophagus was normal.                           The stomach was normal.                           The cardia and gastric fundus were normal on                            retroflexion.                           A diverticulum was found in the second portion of                            the duodenum.                           Diffuse moderately congested mucosa without active                            bleeding and with no stigmata of bleeding was found  in the second portion of the duodenum. Biopsies for                            histology were taken with a cold forceps for                            evaluation of celiac disease. Complications:            No immediate complications. Estimated Blood Loss:     Estimated blood loss was minimal. Impression:               - Normal esophagus.                           - Normal stomach.                           - Duodenal diverticulum.                           - Congested duodenal mucosa. Biopsied. Recommendation:           - Patient has a contact number available for                            emergencies. The signs and symptoms of potential                            delayed complications were discussed with the                            patient. Return to normal activities tomorrow.                            Written discharge instructions were provided to the                            patient.                           - Resume previous diet.                           - Continue present medications.                            - Await pathology results.                           - See the other procedure note for documentation of                            additional recommendations. Daivion Pape L. Loletha Carrow, MD 07/28/2016 8:36:23 AM This report has been signed electronically.

## 2016-07-28 NOTE — Progress Notes (Signed)
Pt's states no medical or surgical changes since previsit or office visit. 

## 2016-07-29 ENCOUNTER — Telehealth: Payer: Self-pay

## 2016-07-29 NOTE — Telephone Encounter (Signed)
  Follow up Call-  Call back number 07/28/2016 10/25/2014  Post procedure Call Back phone  # [phone]  Permission to leave phone message Yes Yes  Some recent data might be hidden     Patient questions:  Do you have a fever, pain , or abdominal swelling? No. Pain Score  0 *  Have you tolerated food without any problems? Yes.    Have you been able to return to your normal activities? Yes.    Do you have any questions about your discharge instructions: Diet   No. Medications  No. Follow up visit  No.  Do you have questions or concerns about your Care? No.  Actions: * If pain score is 4 or above: No action needed, pain <4.

## 2016-08-02 ENCOUNTER — Telehealth: Payer: Self-pay | Admitting: Gastroenterology

## 2016-08-02 NOTE — Telephone Encounter (Signed)
Patient called states that she is having midlower abdominal pain since 3 am and it is not letting up, still having diarrhea. She denies any fever, nausea or vomiting. She has not taken any imodium, and has been very careful with her diet. She is questioning c-diff results. She is also wondering if the results are back from last weeks procedure. Please advise.

## 2016-08-03 ENCOUNTER — Other Ambulatory Visit: Payer: Self-pay

## 2016-08-03 ENCOUNTER — Telehealth: Payer: Self-pay | Admitting: Gastroenterology

## 2016-08-03 DIAGNOSIS — K900 Celiac disease: Secondary | ICD-10-CM

## 2016-08-03 MED ORDER — BUDESONIDE 9 MG PO TB24: 1.0000 | ORAL_TABLET | Freq: Every day | ORAL | 2 refills | Status: DC

## 2016-08-03 MED ORDER — DICYCLOMINE HCL 10 MG PO CAPS: 10.0000 mg | ORAL_CAPSULE | Freq: Three times a day (TID) | ORAL | 1 refills | Status: DC | PRN

## 2016-08-03 NOTE — Telephone Encounter (Signed)
Patient calling back about message routed yesterday. Please advise.

## 2016-08-03 NOTE — Telephone Encounter (Signed)
Medication sent to pharmacy, labs ordered, along with referral to dietician. Will call patient tomorrow to schedule follow up in office.

## 2016-08-03 NOTE — Telephone Encounter (Signed)
[facility] called Angela Morales with pathology results and plan. 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    Needs referral to dietician re: gluten free diet.    Treatment for microscopic colitis:  Uceris 9 mg once daily (if insurance will cover).  Disp #30, RF 2 If insurance will not cover Uceris, try for Asacol HD 800 mg, 2 caplets twice daily.  Disp #120, RF 2  Also call in dicyclomine 10 mg , one capsule every 8 hours as needed for abdominal cramps.  Disp #30, RF 1  Start OTC calcium (500 or 600 mg) plus Vitamin D tablets.  Two tablets daily.  Before the next clinic visit, please have her come to the lab for following blood tests: Vitamin A, 1,25 [place] vitamin D, Vitamin E, Iron/IBC and ferritin, zinc, folic acid, vitamin F02, PT/INR   Please see me in 2-3 weeks.  Call sooner if needed, certainly if not better after a week on Uceris/Asacol

## 2016-08-04 ENCOUNTER — Other Ambulatory Visit: Payer: Self-pay

## 2016-08-04 ENCOUNTER — Telehealth: Payer: Self-pay | Admitting: Gastroenterology

## 2016-08-04 MED ORDER — METRONIDAZOLE 500 MG PO TABS: 500.0000 mg | ORAL_TABLET | Freq: Three times a day (TID) | ORAL | 0 refills | Status: DC

## 2016-08-04 MED ORDER — MESALAMINE 800 MG PO TBEC: 2.0000 | DELAYED_RELEASE_TABLET | Freq: Two times a day (BID) | ORAL | 2 refills | Status: DC

## 2016-08-04 MED ORDER — MESALAMINE 800 MG PO TBEC: DELAYED_RELEASE_TABLET | ORAL | 2 refills | Status: DC

## 2016-08-04 NOTE — Telephone Encounter (Signed)
I understand Angela Morales called with concerns re: Uceris and cataracts/glaucoma. There is probably a warning because it is technically a steroid, even though it has a high first-pass metabolism by the liver as she and I discussed.  Therefore, the risk is much lower than with prednisone.  Nevertheless, if she would prefer not to take the risk, we will use Asacol instead, dosed as in my prior note.  Also, please tell her that I considered her case more last evening after we spoke.  Based on her stool tests, I do not think she is likely to have C difficile.  Still, for as bad as she feels, and because the tests can be falsely negative at times, and the timing of this all occurring so soon after hospitalization for surgery, I think we should treat for that possibility. My advice is metronidazole 500 mg, one tablet three times daily for 10 days.  Disp #30, RF zero.  If she is agreeable, please send Rx

## 2016-08-04 NOTE — Telephone Encounter (Signed)
Patient would prefer to try the Asacol, so Rx sent to pharmacy. She is agreeable to trying the flagyl to see if that helps, did discuss the side effects of that medication. Rx sent to pharmacy for that too.

## 2016-08-09 DIAGNOSIS — Z803 Family history of malignant neoplasm of breast: Secondary | ICD-10-CM | POA: Diagnosis not present

## 2016-08-09 DIAGNOSIS — Z1231 Encounter for screening mammogram for malignant neoplasm of breast: Secondary | ICD-10-CM | POA: Diagnosis not present

## 2016-08-11 ENCOUNTER — Telehealth: Payer: Self-pay | Admitting: Gastroenterology

## 2016-08-11 ENCOUNTER — Other Ambulatory Visit: Payer: Self-pay

## 2016-08-11 DIAGNOSIS — K900 Celiac disease: Secondary | ICD-10-CM

## 2016-08-11 NOTE — Telephone Encounter (Signed)
Put through referral again, asked that if she has not heard from them by Friday to call me back and I would call them directly.

## 2016-08-16 ENCOUNTER — Other Ambulatory Visit (INDEPENDENT_AMBULATORY_CARE_PROVIDER_SITE_OTHER): Payer: Medicare Other

## 2016-08-16 DIAGNOSIS — K900 Celiac disease: Secondary | ICD-10-CM

## 2016-08-16 LAB — IBC PANEL
IRON: 34 ug/dL — AB (ref 42–145)
Saturation Ratios: 9 % — ABNORMAL LOW (ref 20.0–50.0)
TRANSFERRIN: 271 mg/dL (ref 212.0–360.0)

## 2016-08-16 LAB — PROTIME-INR
INR: 1.2 ratio — ABNORMAL HIGH (ref 0.8–1.0)
PROTHROMBIN TIME: 12.4 s (ref 9.6–13.1)

## 2016-08-16 LAB — FERRITIN: FERRITIN: 11.5 ng/mL (ref 10.0–291.0)

## 2016-08-16 LAB — VITAMIN B12: Vitamin B-12: 494 pg/mL (ref 211–911)

## 2016-08-16 LAB — FOLATE

## 2016-08-17 DIAGNOSIS — Z96652 Presence of left artificial knee joint: Secondary | ICD-10-CM | POA: Diagnosis not present

## 2016-08-17 DIAGNOSIS — H40013 Open angle with borderline findings, low risk, bilateral: Secondary | ICD-10-CM | POA: Diagnosis not present

## 2016-08-17 DIAGNOSIS — M25562 Pain in left knee: Secondary | ICD-10-CM | POA: Diagnosis not present

## 2016-08-18 LAB — VITAMIN A: Vitamin A (Retinoic Acid): 36 ug/dL — ABNORMAL LOW (ref 38–98)

## 2016-08-18 LAB — VITAMIN D 1,25 DIHYDROXY
VITAMIN D3 1, 25 (OH): 43 pg/mL
Vitamin D 1, 25 (OH)2 Total: 43 pg/mL (ref 18–72)

## 2016-08-20 LAB — ZINC: ZINC: 57 ug/dL — AB (ref 60–130)

## 2016-08-20 LAB — VITAMIN E: Vitamin E (Alpha Tocopherol): 10.2 mg/L (ref 5.7–19.9)

## 2016-08-24 ENCOUNTER — Encounter: Payer: Self-pay | Admitting: Gastroenterology

## 2016-08-24 ENCOUNTER — Ambulatory Visit (INDEPENDENT_AMBULATORY_CARE_PROVIDER_SITE_OTHER): Payer: Medicare Other | Admitting: Gastroenterology

## 2016-08-24 VITALS — BP 120/70 | HR 68 | Ht 64.0 in | Wt 125.2 lb

## 2016-08-24 DIAGNOSIS — R634 Abnormal weight loss: Secondary | ICD-10-CM | POA: Diagnosis not present

## 2016-08-24 DIAGNOSIS — K52832 Lymphocytic colitis: Secondary | ICD-10-CM | POA: Diagnosis not present

## 2016-08-24 DIAGNOSIS — K900 Celiac disease: Secondary | ICD-10-CM | POA: Diagnosis not present

## 2016-08-24 DIAGNOSIS — A09 Infectious gastroenteritis and colitis, unspecified: Secondary | ICD-10-CM | POA: Diagnosis not present

## 2016-08-24 DIAGNOSIS — D508 Other iron deficiency anemias: Secondary | ICD-10-CM | POA: Diagnosis not present

## 2016-08-24 NOTE — Progress Notes (Signed)
[facility] Progress Note  Chief Complaint: Diarrhea and abdominal pain  Subjective  History:  Angela Morales follows up after her EGD and colonoscopy done for abdominal pain diarrhea and weight loss. My initial clinical suspicion was probably C. difficile colitis after her initial office visit, since that it occurred about 2 weeks after her knee surgery. However, her stool testing suggested it probably was not C. difficile. PCR toxin tests were negative, EGD H E antigen positive. She then underwent the EGD and colonoscopy. In addition, her initial labs had a highly positive TTG antibody. 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 I was planning to have her start Uceris for that, but it was too expensive. She was unable to see a dietitian due to scheduling, but has gone on a gluten-free diet on her own. However, after reviewing her initial pathology reports with her over the telephone, I then decided to treat her empirically for possible C. difficile with 10 days of metronidazole. She tells me that within 24 hours of the first dose the cramps resolved within 48 hours the diarrhea stopped. He has started back gaining some of the weight she had lost.  ROS: Cardiovascular:  no chest pain Respiratory: no dyspnea  The patient's Past Medical, Family and Social History were reviewed and are on file in the EMR.  Objective:  Med list reviewed  Vital signs in last 24 hrs: Vitals:   08/24/16 1041  BP: 120/70  Pulse: 68    Physical Exam    HEENT: sclera anicteric, oral mucosa moist without lesions  Neck: supple, no thyromegaly, JVD or lymphadenopathy  Cardiac: RRR without murmurs, S1S2 heard, no peripheral edema  Pulm: clear to auscultation bilaterally, normal RR and effort noted  Abdomen: soft, No tenderness, with active bowel sounds. No guarding or palpable hepatosplenomegaly.  Skin; warm and dry, no jaundice or  rash  Recent Labs:  See extensive nutritional labs. Essentially, she was iron deficient with a ferritin of 11.5 and hemoglobin 11.5. She has mildly low vitamin a and zinc levels, normal vitamin D and E  Pathology reports as above.   @ASSESSMENTPLANBEGIN @ Assessment: Encounter Diagnoses  Name Primary?  . Diarrhea of infectious origin Yes  . Celiac sprue   . Lymphocytic colitis   . Abnormal loss of weight   . Other iron deficiency anemia    I think she may truly have had C. difficile. 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 She is on iron supplements, which she will take for about 2 months and that her iron levels rechecked with primary care. She will take a daily multivitamin for the mildly decreased zinc and vitamin A levels. She is due for a bone density study, having had osteoporosis on a scan in March 2016, and now a diagnosis of celiac sprue. She will address this with primary care her next visit, she is on a calcium supplement  I think her microscopic colitis was related to the probable C. difficile, and less so from celiac sprue. Since the diarrhea has resolved, I do not think she needs anti-inflammatory therapy. She would like to take occasional NSAIDs for osteoarthritis, and I feel that is fine.  See me in a year or sooner as needed  Total time 30 minutes, over half spent in counseling and coordination of care.   Nelida Meuse III

## 2016-08-24 NOTE — Patient Instructions (Signed)
If you are age 72 or older, your body mass index should be between 23-30. Your Body mass index is 21.5 kg/m. If this is out of the aforementioned range listed, please consider follow up with your Primary Care Provider.  If you are age 74 or younger, your body mass index should be between 19-25. Your Body mass index is 21.5 kg/m. If this is out of the aformentioned range listed, please consider follow up with your Primary Care Provider.   Thank you for choosing [facility]  Dr Amada Jupiter III

## 2016-09-07 DIAGNOSIS — M810 Age-related osteoporosis without current pathological fracture: Secondary | ICD-10-CM | POA: Diagnosis not present

## 2016-09-14 DIAGNOSIS — H40053 Ocular hypertension, bilateral: Secondary | ICD-10-CM | POA: Diagnosis not present

## 2016-09-17 ENCOUNTER — Ambulatory Visit (INDEPENDENT_AMBULATORY_CARE_PROVIDER_SITE_OTHER): Payer: Medicare Other | Admitting: Cardiovascular Disease

## 2016-09-17 ENCOUNTER — Encounter: Payer: Self-pay | Admitting: Cardiovascular Disease

## 2016-09-17 VITALS — BP 98/66 | HR 58 | Ht 65.0 in | Wt 124.0 lb

## 2016-09-17 DIAGNOSIS — I4891 Unspecified atrial fibrillation: Secondary | ICD-10-CM

## 2016-09-17 NOTE — Patient Instructions (Signed)
Medication Instructions: Your physician recommends that you continue on your current medications as directed. Please refer to the Current Medication list given to you today.   Labwork: None ordered  Procedures/Testing: None ordered  Follow-Up: Your physician wants you to follow-up in: 1 year with Dr. Royann Shivers.  You will receive a reminder letter in the mail two months in advance. If you don't receive a letter, please call our office to schedule the follow-up appointment.   Any Additional Special Instructions Will Be Listed Below (If Applicable).     If you need a refill on your cardiac medications before your next appointment, please call your pharmacy.

## 2016-09-17 NOTE — Progress Notes (Signed)
Cardiology Office Note    Date:  09/17/2016   ID:  Angela Morales, DOB 1945-11-22, MRN [id number]  PCP:  Thressa Sheller, MD  Cardiologist:   Sanda Klein, MD   Chief Complaint  Patient presents with  . Follow-up    Afib    History of Present Illness:  Angela Morales is a 71 y.o. female with what appears to be "lone" atrial fibrillation with rapid ventricular response.   Last year, following knee surgery she developed a protracted course of diarrhea and abdominal pain that lasted for about 4 months. He lost substantial weight. She had antibody and biopsy-proven celiac disease and developed substantial weight loss. After following a gluten-free diet and a brief course of oral metronidazole her symptoms are substantially improved. During the period of diarrheal illness she had a marked increase in the frequency and persistence of her palpitations, over the last month she has not had any palpitations whatsoever. She has not had diarrhea in about 6 weeks. She denies recent problems with dizziness, syncope, focal neurological complaints, active bleeding, edema, angina or shortness of breath.  Previous workup has demonstrated the absence of significant structural cardiac abnormalities. Both the right and left atrium were described as being normal in size by echocardiography. There are no valvular problems.  Past Medical History:  Diagnosis Date  . Abnormal uterine bleeding    on HRT  . Allergy    seasonal  . Arthritis   . Atrial fibrillation ([facility])   . Celiac disease   . DDD (degenerative disc disease)   . Dysrhythmia   . Glaucoma   . Microscopic colitis   . Osteoarthritis of both knees   . PONV (postoperative nausea and vomiting)   . SVT (supraventricular tachycardia) (HCC)     Past Surgical History:  Procedure Laterality Date  . APPENDECTOMY    . COLONOSCOPY    . FOOT FRACTURE SURGERY  10/2013  . TONSILLECTOMY AND ADENOIDECTOMY    . TOTAL KNEE ARTHROPLASTY Left  03/22/2016   Procedure: TOTAL KNEE ARTHROPLASTY;  Surgeon: Dorna Leitz, MD;  Location: [facility];  Service: Orthopedics;  Laterality: Left;  . TUBAL LIGATION  1981  . VAGINAL HYSTERECTOMY  04/20/05   prolapse, adenomyosis    Current Medications: Outpatient Medications Prior to Visit  Medication Sig Dispense Refill  . aspirin 81 MG chewable tablet Chew by mouth daily.    . calcium gluconate 500 MG tablet Take 1 tablet by mouth daily.    . Cholecalciferol (VITAMIN D PO) Take 2,000 Int'l Units by mouth daily.     . Ferrous Sulfate (IRON SLOW RELEASE) 143 (45 Fe) MG TBCR Take 1 tablet by mouth daily.    Marland Kitchen loratadine (CLARITIN) 10 MG tablet Take 10 mg by mouth as needed.     . metoprolol tartrate (LOPRESSOR) 25 MG tablet Take 12.5 mg by mouth 2 (two) times daily.     . psyllium (METAMUCIL) 58.6 % packet Take 1 packet by mouth daily.    . timolol (TIMOPTIC) 0.5 % ophthalmic solution Place 1 drop into both eyes 3 (three) times daily.    Marland Kitchen LOTEMAX 0.5 % ophthalmic suspension Place 1 drop into both eyes daily.      Facility-Administered Medications Prior to Visit  Medication Dose Route Frequency Provider Last Rate Last Dose  . 0.9 %  sodium chloride infusion  500 mL Intravenous Continuous Doran Stabler, MD         Allergies:   Cefdinir   Social History  Social History  . Marital status: Married    Spouse name: N/A  . Number of children: 3  . Years of education: N/A   Occupational History  . retired    Social History Main Topics  . Smoking status: Never Smoker  . Smokeless tobacco: Never Used  . Alcohol use 2.4 - 3.6 oz/week    4 - 6 Glasses of wine per week     Comment: occasional  . Drug use: No  . Sexual activity: Yes    Partners: Male    Birth control/ protection: Surgical     Comment: TVH   Other Topics Concern  . None   Social History Narrative  . None     Family History:  The patient's family history includes Bipolar disorder in her son; Breast cancer in her  cousin; Heart disease (age of onset: 47) in her brother; Liver disease in her son; Osteoarthritis in her maternal grandmother; Osteoporosis in her mother; Prostate cancer in her father; Stroke in her mother.   ROS:   Please see the history of present illness.    ROS All other systems reviewed and are negative.   PHYSICAL EXAM:   VS:  BP 98/66   Pulse (!) 58   Ht 5' 5"  (1.651 m)   Wt 56.2 kg (124 lb)   LMP 04/20/2005   BMI 20.63 kg/m    GEN: Well nourished, well developed, in no acute distress  HEENT: normal  Neck: no JVD, carotid bruits, or masses Cardiac: RRR; no murmurs, rubs, or gallops,no edema  Respiratory:  clear to auscultation bilaterally, normal work of breathing GI: soft, nontender, nondistended, + BS MS: no deformity or atrophy  Skin: warm and dry, no rash Neuro:  Alert and Oriented x 3, Strength and sensation are intact Psych: euthymic mood, full affect  Wt Readings from Last 3 Encounters:  09/17/16 56.2 kg (124 lb)  08/24/16 56.8 kg (125 lb 4 oz)  07/28/16 58.1 kg (128 lb)      Studies/Labs Reviewed:   EKG:  EKG is ordered today.  The ekg ordered today demonstrates Mild sinus bradycardia, normal QT interval.  ASSESSMENT:    No diagnosis found.   PLAN:  In order of problems listed above:  1. Previously had palpitations once or twice a month, worsened during her period of diarrhea/weight loss, now substantially improved without any events in the last month. We have previously discussed the stroke risk with atrial fibrillation. Technically she meets criteria for full anticoagulation (CHADSVasc 2 - age, gender), but the actual risk of stroke is pretty much balanced by the risk of bleeding in this older and very slender woman. One positive development is that she no longer requires nonsteroidal anti-inflammatory drugs and therefore her risk of bleeding is less. Nevertheless, at this point she prefers to continue aspirin for stroke prophylaxis, rather than full  anticoagulation. She understands that she should immediately report any focal neurological events, no matter how brief. We'll continue low-dose beta blocker for now, discussed the fact that there are options for pharmacological or ablation based antiarrhythmic therapy. Keep in mind the possibility of a watchman device, if anticoagulants become clearly necessary, but she is felt to be at excessive bleeding risk..    Medication Adjustments/Labs and Tests Ordered: Current medicines are reviewed at length with the patient today.  Concerns regarding medicines are outlined above.  Medication changes, Labs and Tests ordered today are listed in the Patient Instructions below. Patient Instructions  Medication Instructions: Your physician  recommends that you continue on your current medications as directed. Please refer to the Current Medication list given to you today.   Labwork: None ordered  Procedures/Testing: None ordered  Follow-Up: Your physician wants you to follow-up in: 1 year with Dr. Sallyanne Kuster.  You will receive a reminder letter in the mail two months in advance. If you don't receive a letter, please call our office to schedule the follow-up appointment.   Any Additional Special Instructions Will Be Listed Below (If Applicable).     If you need a refill on your cardiac medications before your next appointment, please call your pharmacy.      Signed, Sanda Klein, MD  09/17/2016 1:59 PM    [facility] [address], [place], [place]  [address] Phone: [phone]; Fax: [phone]

## 2016-10-07 DIAGNOSIS — D509 Iron deficiency anemia, unspecified: Secondary | ICD-10-CM | POA: Diagnosis not present

## 2016-10-07 DIAGNOSIS — M810 Age-related osteoporosis without current pathological fracture: Secondary | ICD-10-CM | POA: Diagnosis not present

## 2016-10-07 DIAGNOSIS — E785 Hyperlipidemia, unspecified: Secondary | ICD-10-CM | POA: Diagnosis not present

## 2016-10-07 DIAGNOSIS — Z0000 Encounter for general adult medical examination without abnormal findings: Secondary | ICD-10-CM | POA: Diagnosis not present

## 2016-10-07 DIAGNOSIS — E559 Vitamin D deficiency, unspecified: Secondary | ICD-10-CM | POA: Diagnosis not present

## 2016-10-11 DIAGNOSIS — M8580 Other specified disorders of bone density and structure, unspecified site: Secondary | ICD-10-CM | POA: Diagnosis not present

## 2016-10-11 DIAGNOSIS — J309 Allergic rhinitis, unspecified: Secondary | ICD-10-CM | POA: Diagnosis not present

## 2016-10-11 DIAGNOSIS — Z23 Encounter for immunization: Secondary | ICD-10-CM | POA: Diagnosis not present

## 2016-10-11 DIAGNOSIS — M19049 Primary osteoarthritis, unspecified hand: Secondary | ICD-10-CM | POA: Diagnosis not present

## 2016-10-11 DIAGNOSIS — Z0001 Encounter for general adult medical examination with abnormal findings: Secondary | ICD-10-CM | POA: Diagnosis not present

## 2016-10-20 DIAGNOSIS — Z8669 Personal history of other diseases of the nervous system and sense organs: Secondary | ICD-10-CM | POA: Diagnosis not present

## 2016-10-20 DIAGNOSIS — H401112 Primary open-angle glaucoma, right eye, moderate stage: Secondary | ICD-10-CM | POA: Diagnosis not present

## 2016-10-20 DIAGNOSIS — H401122 Primary open-angle glaucoma, left eye, moderate stage: Secondary | ICD-10-CM | POA: Diagnosis not present

## 2016-10-20 DIAGNOSIS — H25813 Combined forms of age-related cataract, bilateral: Secondary | ICD-10-CM | POA: Diagnosis not present

## 2016-11-02 DIAGNOSIS — H25813 Combined forms of age-related cataract, bilateral: Secondary | ICD-10-CM | POA: Diagnosis not present

## 2016-11-02 DIAGNOSIS — H401132 Primary open-angle glaucoma, bilateral, moderate stage: Secondary | ICD-10-CM | POA: Diagnosis not present

## 2016-12-06 DIAGNOSIS — H401112 Primary open-angle glaucoma, right eye, moderate stage: Secondary | ICD-10-CM | POA: Diagnosis not present

## 2016-12-06 DIAGNOSIS — H25811 Combined forms of age-related cataract, right eye: Secondary | ICD-10-CM | POA: Diagnosis not present

## 2016-12-06 DIAGNOSIS — H2511 Age-related nuclear cataract, right eye: Secondary | ICD-10-CM | POA: Diagnosis not present

## 2017-02-02 DIAGNOSIS — H40013 Open angle with borderline findings, low risk, bilateral: Secondary | ICD-10-CM | POA: Diagnosis not present

## 2017-02-22 DIAGNOSIS — Z96652 Presence of left artificial knee joint: Secondary | ICD-10-CM | POA: Diagnosis not present

## 2017-02-22 DIAGNOSIS — M5442 Lumbago with sciatica, left side: Secondary | ICD-10-CM | POA: Diagnosis not present

## 2017-02-22 DIAGNOSIS — M545 Low back pain: Secondary | ICD-10-CM | POA: Diagnosis not present

## 2017-02-22 DIAGNOSIS — M25562 Pain in left knee: Secondary | ICD-10-CM | POA: Diagnosis not present

## 2017-03-15 DIAGNOSIS — H40013 Open angle with borderline findings, low risk, bilateral: Secondary | ICD-10-CM | POA: Diagnosis not present

## 2017-03-24 DIAGNOSIS — H401113 Primary open-angle glaucoma, right eye, severe stage: Secondary | ICD-10-CM | POA: Diagnosis not present

## 2017-03-24 DIAGNOSIS — H2512 Age-related nuclear cataract, left eye: Secondary | ICD-10-CM | POA: Diagnosis not present

## 2017-03-24 DIAGNOSIS — H401121 Primary open-angle glaucoma, left eye, mild stage: Secondary | ICD-10-CM | POA: Diagnosis not present

## 2017-03-29 DIAGNOSIS — H26491 Other secondary cataract, right eye: Secondary | ICD-10-CM | POA: Diagnosis not present

## 2017-03-29 DIAGNOSIS — H25812 Combined forms of age-related cataract, left eye: Secondary | ICD-10-CM | POA: Diagnosis not present

## 2017-03-29 DIAGNOSIS — H33312 Horseshoe tear of retina without detachment, left eye: Secondary | ICD-10-CM | POA: Diagnosis not present

## 2017-03-29 DIAGNOSIS — H04123 Dry eye syndrome of bilateral lacrimal glands: Secondary | ICD-10-CM | POA: Diagnosis not present

## 2017-03-29 DIAGNOSIS — H401132 Primary open-angle glaucoma, bilateral, moderate stage: Secondary | ICD-10-CM | POA: Diagnosis not present

## 2017-04-13 DIAGNOSIS — H2512 Age-related nuclear cataract, left eye: Secondary | ICD-10-CM | POA: Diagnosis not present

## 2017-04-13 DIAGNOSIS — H25812 Combined forms of age-related cataract, left eye: Secondary | ICD-10-CM | POA: Diagnosis not present

## 2017-05-03 DIAGNOSIS — B309 Viral conjunctivitis, unspecified: Secondary | ICD-10-CM | POA: Diagnosis not present

## 2017-07-14 DIAGNOSIS — M545 Low back pain: Secondary | ICD-10-CM | POA: Diagnosis not present

## 2017-08-12 DIAGNOSIS — Z1231 Encounter for screening mammogram for malignant neoplasm of breast: Secondary | ICD-10-CM | POA: Diagnosis not present

## 2017-08-31 DIAGNOSIS — H40053 Ocular hypertension, bilateral: Secondary | ICD-10-CM | POA: Diagnosis not present

## 2017-10-03 ENCOUNTER — Encounter: Payer: Self-pay | Admitting: Cardiovascular Disease

## 2017-10-03 ENCOUNTER — Ambulatory Visit: Payer: Medicare Other | Admitting: Cardiovascular Disease

## 2017-10-03 VITALS — BP 104/60 | HR 64 | Ht 65.0 in | Wt 149.0 lb

## 2017-10-03 DIAGNOSIS — I480 Paroxysmal atrial fibrillation: Secondary | ICD-10-CM | POA: Diagnosis not present

## 2017-10-03 MED ORDER — METOPROLOL SUCCINATE ER 25 MG PO TB24: 25.0000 mg | ORAL_TABLET | Freq: Every day | ORAL | 3 refills | Status: DC

## 2017-10-03 NOTE — Progress Notes (Signed)
Cardiology Office Note    Date:  10/05/2017   ID:  Angela Morales, DOB 08-15-1945, MRN [id number]  PCP:  Deland Pretty, MD  Cardiologist:   Sanda Klein, MD   Chief Complaint  Patient presents with  . Atrial Fibrillation    History of Present Illness:  Angela Morales is a 72 y.o. female with what appears to be "lone" atrial fibrillation with rapid ventricular response.   She had a episode of atrial fibrillation related to an acute diarrheal illness, eventually proven to the true celiac disease.  Since then her GI symptoms have improved substantially on a gluten-free diet.  Previous workup has demonstrated the absence of significant structural cardiac abnormalities. Both the right and left atrium were described as being normal in size by echocardiography. There are no valvular problems.  She continues to have palpitations that occur 2 or 3 times every month and last for a few hours.  She generally manages them satisfactorily by taking an extra half tablet of metoprolol.  The heart rate is usually in the 120-130 range.  She believes the rhythm is irregular during these spells.  Today she is in sinus rhythm.  She has trouble cutting the metoprolol tartrate tablets in half.  She believes that when the tablet fragments are not equal in size she appears to have more problems with palpitations.  The patient specifically denies any chest pain at rest exertion, dyspnea at rest or with exertion, orthopnea, paroxysmal nocturnal dyspnea, syncope,  focal neurological deficits, intermittent claudication, lower extremity edema, unexplained weight gain, cough, hemoptysis or wheezing.  She denies any bleeding problems.  Due to her own preference she is not taking anticoagulants despite increased embolic risk.  Past Medical History:  Diagnosis Date  . Abnormal uterine bleeding    on HRT  . Allergy    seasonal  . Arthritis   . Atrial fibrillation ([place])   . Celiac disease   . DDD  (degenerative disc disease)   . Dysrhythmia   . Glaucoma   . Microscopic colitis   . Osteoarthritis of both knees   . PONV (postoperative nausea and vomiting)   . SVT (supraventricular tachycardia) (HCC)     Past Surgical History:  Procedure Laterality Date  . APPENDECTOMY    . COLONOSCOPY    . FOOT FRACTURE SURGERY  10/2013  . TONSILLECTOMY AND ADENOIDECTOMY    . TOTAL KNEE ARTHROPLASTY Left 03/22/2016   Procedure: TOTAL KNEE ARTHROPLASTY;  Surgeon: Dorna Leitz, MD;  Location: [facility];  Service: Orthopedics;  Laterality: Left;  . TUBAL LIGATION  1981  . VAGINAL HYSTERECTOMY  04/20/05   prolapse, adenomyosis    Current Medications: Outpatient Medications Prior to Visit  Medication Sig Dispense Refill  . aspirin EC 81 MG tablet Take 81 mg by mouth daily.    . calcium gluconate 500 MG tablet Take 1 tablet by mouth daily.    Marland Kitchen ibuprofen (ADVIL,MOTRIN) 200 MG tablet Take 200 mg by mouth every 6 (six) hours as needed.    . loratadine (CLARITIN) 10 MG tablet Take 10 mg by mouth as needed.     . psyllium (METAMUCIL) 58.6 % packet Take 1 packet by mouth daily.    . timolol (TIMOPTIC) 0.5 % ophthalmic solution Place 1 drop into both eyes 3 (three) times daily.    . metoprolol tartrate (LOPRESSOR) 25 MG tablet Take 12.5 mg by mouth 2 (two) times daily.     Marland Kitchen aspirin 81 MG chewable tablet Chew by mouth  daily.    . Cholecalciferol (VITAMIN D PO) Take 2,000 Int'l Units by mouth daily.     . Ferrous Sulfate (IRON SLOW RELEASE) 143 (45 Fe) MG TBCR Take 1 tablet by mouth daily.     Facility-Administered Medications Prior to Visit  Medication Dose Route Frequency Provider Last Rate Last Dose  . 0.9 %  sodium chloride infusion  500 mL Intravenous Continuous [name], Kirke Corin, MD         Allergies:   Cefdinir   Social History   Socioeconomic History  . Marital status: Married    Spouse name: Not on file  . Number of children: 3  . Years of education: Not on file  . Highest education  level: Not on file  Occupational History  . Occupation: retired  Scientific laboratory technician  . Financial resource strain: Not on file  . Food insecurity:    Worry: Not on file    Inability: Not on file  . Transportation needs:    Medical: Not on file    Non-medical: Not on file  Tobacco Use  . Smoking status: Never Smoker  . Smokeless tobacco: Never Used  Substance and Sexual Activity  . Alcohol use: Yes    Alcohol/week: 2.4 - 3.6 oz    Types: 4 - 6 Glasses of wine per week    Comment: occasional  . Drug use: No  . Sexual activity: Yes    Partners: Male    Birth control/protection: Surgical    Comment: TVH  Lifestyle  . Physical activity:    Days per week: Not on file    Minutes per session: Not on file  . Stress: Not on file  Relationships  . Social connections:    Talks on phone: Not on file    Gets together: Not on file    Attends religious service: Not on file    Active member of club or organization: Not on file    Attends meetings of clubs or organizations: Not on file    Relationship status: Not on file  Other Topics Concern  . Not on file  Social History Narrative  . Not on file     Family History:  The patient's family history includes Bipolar disorder in her son; Breast cancer in her cousin; Heart disease (age of onset: 42) in her brother; Liver disease in her son; Osteoarthritis in her maternal grandmother; Osteoporosis in her mother; Prostate cancer in her father; Stroke in her mother.   ROS:   Please see the history of present illness.    ROS All other systems reviewed and are negative.   PHYSICAL EXAM:   VS:  BP 104/60   Pulse 64   Ht 5' 5"  (1.651 m)   Wt 149 lb (67.6 kg)   LMP 04/20/2005   BMI 24.79 kg/m     General: Alert, oriented x3, no distress, appears lean and fit Head: no evidence of trauma, PERRL, EOMI, no exophtalmos or lid lag, no myxedema, no xanthelasma; normal ears, nose and oropharynx Neck: normal jugular venous pulsations and no  hepatojugular reflux; brisk carotid pulses without delay and no carotid bruits Chest: clear to auscultation, no signs of consolidation by percussion or palpation, normal fremitus, symmetrical and full respiratory excursions Cardiovascular: normal position and quality of the apical impulse, regular rhythm, normal first and second heart sounds, no murmurs, rubs or gallops Abdomen: no tenderness or distention, no masses by palpation, no abnormal pulsatility or arterial bruits, normal bowel sounds, no  hepatosplenomegaly Extremities: no clubbing, cyanosis or edema; 2+ radial, ulnar and brachial pulses bilaterally; 2+ right femoral, posterior tibial and dorsalis pedis pulses; 2+ left femoral, posterior tibial and dorsalis pedis pulses; no subclavian or femoral bruits Neurological: grossly nonfocal Psych: Normal mood and affect   Wt Readings from Last 3 Encounters:  10/03/17 149 lb (67.6 kg)  09/17/16 124 lb (56.2 kg)  08/24/16 125 lb 4 oz (56.8 kg)      Studies/Labs Reviewed:   EKG:  EKG is ordered today.  Shows normal sinus rhythm.  QTc 408 ms  ASSESSMENT:    1. Paroxysmal atrial fibrillation (HCC)      PLAN:  In order of problems listed above:   AFib:She had intermittent palpitations even before her episode of acute diarrheal illness due to celiac disease, although the palpitations became much more persistent during that time.  She does not have a lot of symptoms other than the palpitations and prefers not to take antiarrhythmics or have ablation.  (CHADSVasc 2 - age, gender), but the actual risk of stroke is pretty much balanced by the risk of bleeding in this older and very slender woman.  She is at risk for embolic events.  Discussed again the fact that she should immediately report any focal neurological events, no matter how brief.  We will switch to metoprolol succinate so that she does not have to cut the tablets in half.  Her blood pressure does not permit higher doses of  beta-blocker.   Medication Adjustments/Labs and Tests Ordered: Current medicines are reviewed at length with the patient today.  Concerns regarding medicines are outlined above.  Medication changes, Labs and Tests ordered today are listed in the Patient Instructions below. Patient Instructions  Dr Sallyanne Kuster has recommended making the following medication changes: 1. STOP Metoprolol TARTrate 2. START Metoprolol SUCCinate 25 mg - take 1 tablet by mouth daily  Your physician recommends that you schedule a follow-up appointment in 12 months. You will receive a reminder letter in the mail two months in advance. If you don't receive a letter, please call our office to schedule the follow-up appointment.  If you need a refill on your cardiac medications before your next appointment, please call your pharmacy.    Signed, Sanda Klein, MD  10/05/2017 4:57 PM    [facility], [place], [place]  [address] Phone: [phone]; Fax: [phone]

## 2017-10-03 NOTE — Patient Instructions (Signed)
Dr Sallyanne Kuster has recommended making the following medication changes: 1. STOP Metoprolol TARTrate 2. START Metoprolol SUCCinate 25 mg - take 1 tablet by mouth daily  Your physician recommends that you schedule a follow-up appointment in 12 months. You will receive a reminder letter in the mail two months in advance. If you don't receive a letter, please call our office to schedule the follow-up appointment.  If you need a refill on your cardiac medications before your next appointment, please call your pharmacy.

## 2017-10-05 ENCOUNTER — Encounter: Payer: Self-pay | Admitting: Cardiovascular Disease

## 2017-10-13 DIAGNOSIS — E785 Hyperlipidemia, unspecified: Secondary | ICD-10-CM | POA: Diagnosis not present

## 2017-10-13 DIAGNOSIS — M810 Age-related osteoporosis without current pathological fracture: Secondary | ICD-10-CM | POA: Diagnosis not present

## 2017-10-13 DIAGNOSIS — D509 Iron deficiency anemia, unspecified: Secondary | ICD-10-CM | POA: Diagnosis not present

## 2017-10-18 ENCOUNTER — Telehealth: Payer: Self-pay | Admitting: Cardiovascular Disease

## 2017-10-18 DIAGNOSIS — Z0000 Encounter for general adult medical examination without abnormal findings: Secondary | ICD-10-CM | POA: Diagnosis not present

## 2017-10-18 DIAGNOSIS — K900 Celiac disease: Secondary | ICD-10-CM | POA: Diagnosis not present

## 2017-10-18 DIAGNOSIS — M19049 Primary osteoarthritis, unspecified hand: Secondary | ICD-10-CM | POA: Diagnosis not present

## 2017-10-18 DIAGNOSIS — E875 Hyperkalemia: Secondary | ICD-10-CM | POA: Diagnosis not present

## 2017-10-18 DIAGNOSIS — I4891 Unspecified atrial fibrillation: Secondary | ICD-10-CM | POA: Diagnosis not present

## 2017-10-18 MED ORDER — APIXABAN 5 MG PO TABS: 5.0000 mg | ORAL_TABLET | Freq: Two times a day (BID) | ORAL | 1 refills | Status: DC

## 2017-10-18 NOTE — Telephone Encounter (Signed)
Returned call to patient she stated last time she saw Dr.Croitoru he advised her she needed to start on a blood thinner.Stated she wanted to think about it first.Stated she has decided she wants to start.Advised Dr.Croitoru is out of office this week.I will send message to our pharmacist for advice.

## 2017-10-18 NOTE — Telephone Encounter (Signed)
Pt was told to call office when ready to start blood thinners Please call pt to discuss

## 2017-10-18 NOTE — Telephone Encounter (Signed)
PCP encouraged patient to start Eliquis or Xarelto.  Rx for Eliqius sent to prefer pharmacy. All questions answered. Bleeding precautions discussed and card for 30 days free supply provided.   Angela Morales PharmD, BCPS, CPP [facility] [address] [place] [address] 10/18/2017 4:10 PM

## 2017-10-21 NOTE — Telephone Encounter (Signed)
thanks

## 2017-11-16 DIAGNOSIS — E875 Hyperkalemia: Secondary | ICD-10-CM | POA: Diagnosis not present

## 2017-12-01 DIAGNOSIS — H40013 Open angle with borderline findings, low risk, bilateral: Secondary | ICD-10-CM | POA: Diagnosis not present

## 2017-12-11 ENCOUNTER — Other Ambulatory Visit: Payer: Self-pay | Admitting: Cardiovascular Disease

## 2017-12-19 DIAGNOSIS — H26491 Other secondary cataract, right eye: Secondary | ICD-10-CM | POA: Diagnosis not present

## 2018-02-03 DIAGNOSIS — H40053 Ocular hypertension, bilateral: Secondary | ICD-10-CM | POA: Diagnosis not present

## 2018-03-06 DIAGNOSIS — H6123 Impacted cerumen, bilateral: Secondary | ICD-10-CM | POA: Diagnosis not present

## 2018-03-06 DIAGNOSIS — H903 Sensorineural hearing loss, bilateral: Secondary | ICD-10-CM | POA: Diagnosis not present

## 2018-05-03 DIAGNOSIS — H401111 Primary open-angle glaucoma, right eye, mild stage: Secondary | ICD-10-CM | POA: Diagnosis not present

## 2018-06-01 DIAGNOSIS — M545 Low back pain: Secondary | ICD-10-CM | POA: Diagnosis not present

## 2018-06-01 DIAGNOSIS — M47818 Spondylosis without myelopathy or radiculopathy, sacral and sacrococcygeal region: Secondary | ICD-10-CM | POA: Diagnosis not present

## 2018-06-01 DIAGNOSIS — M1611 Unilateral primary osteoarthritis, right hip: Secondary | ICD-10-CM | POA: Diagnosis not present

## 2018-06-02 ENCOUNTER — Other Ambulatory Visit: Payer: Self-pay

## 2018-06-02 MED ORDER — APIXABAN 5 MG PO TABS: 5.0000 mg | ORAL_TABLET | Freq: Two times a day (BID) | ORAL | 1 refills | Status: DC

## 2018-07-19 ENCOUNTER — Ambulatory Visit: Payer: Medicare Other | Admitting: Cardiovascular Disease

## 2018-07-19 ENCOUNTER — Encounter: Payer: Self-pay | Admitting: Cardiovascular Disease

## 2018-07-19 VITALS — BP 118/64 | HR 68 | Ht 65.0 in | Wt 151.2 lb

## 2018-07-19 DIAGNOSIS — I480 Paroxysmal atrial fibrillation: Secondary | ICD-10-CM

## 2018-07-19 NOTE — Progress Notes (Signed)
Cardiology Office Note    Date:  07/22/2018   ID:  Angela Morales, DOB 1945-10-21, MRN [id number]  PCP:  Angela Pretty, MD  Cardiologist:   Sanda Klein, MD   Chief Complaint  Patient presents with  . Atrial Fibrillation    History of Present Illness:  Angela Morales is a 73 y.o. female with what appears to be "lone" atrial fibrillation with rapid ventricular response.   She had a episode of atrial fibrillation related to an acute diarrheal illness, eventually proven to the true celiac disease.  Since then her GI symptoms have improved substantially on a gluten-free diet.  Previous workup has demonstrated the absence of significant structural cardiac abnormalities. Both the right and left atrium were described as being normal in size by echocardiography. There are no valvular problems.  Today the rhythm is sinus with PACs.  Continues to have palpitations about once every 2 weeks or so.  She felt particularly bad about 10 days ago when she had palpitations, nausea and lightheadedness.  She is now back to normal.  The patient specifically denies any chest pain at rest exertion, dyspnea at rest or with exertion, orthopnea, paroxysmal nocturnal dyspnea, syncope,  focal neurological deficits, intermittent claudication, lower extremity edema, unexplained weight gain, cough, hemoptysis or wheezing.  She is now on anticoagulation with apixaban and has not had any bleeding problems.  Past Medical History:  Diagnosis Date  . Abnormal uterine bleeding    on HRT  . Allergy    seasonal  . Arthritis   . Atrial fibrillation ([place])   . Celiac disease   . DDD (degenerative disc disease)   . Dysrhythmia   . Glaucoma   . Microscopic colitis   . Osteoarthritis of both knees   . PONV (postoperative nausea and vomiting)   . SVT (supraventricular tachycardia) (HCC)     Past Surgical History:  Procedure Laterality Date  . APPENDECTOMY    . COLONOSCOPY    . FOOT FRACTURE SURGERY   10/2013  . TONSILLECTOMY AND ADENOIDECTOMY    . TOTAL KNEE ARTHROPLASTY Left 03/22/2016   Procedure: TOTAL KNEE ARTHROPLASTY;  Surgeon: Dorna Leitz, MD;  Location: [facility];  Service: Orthopedics;  Laterality: Left;  . TUBAL LIGATION  1981  . VAGINAL HYSTERECTOMY  04/20/05   prolapse, adenomyosis    Current Medications: Outpatient Medications Prior to Visit  Medication Sig Dispense Refill  . apixaban (ELIQUIS) 5 MG TABS tablet Take 1 tablet (5 mg total) by mouth 2 (two) times daily. 180 tablet 1  . calcium gluconate 500 MG tablet Take 1 tablet by mouth as directed.     Marland Kitchen ibuprofen (ADVIL,MOTRIN) 200 MG tablet Take 200 mg by mouth every 6 (six) hours as needed.    . loratadine (CLARITIN) 10 MG tablet Take 10 mg by mouth as needed.     . metoprolol succinate (TOPROL-XL) 25 MG 24 hr tablet Take 1 tablet (25 mg total) by mouth daily. 90 tablet 3  . psyllium (METAMUCIL) 58.6 % packet Take 1 packet by mouth daily.    . timolol (TIMOPTIC) 0.5 % ophthalmic solution Place 1 drop into both eyes 3 (three) times daily.    Marland Kitchen aspirin EC 81 MG tablet Take 81 mg by mouth daily.     Facility-Administered Medications Prior to Visit  Medication Dose Route Frequency Provider Last Rate Last Dose  . 0.9 %  sodium chloride infusion  500 mL Intravenous Continuous [name], Kirke Corin, MD  Allergies:   Cefdinir   Social History   Socioeconomic History  . Marital status: Married    Spouse name: Not on file  . Number of children: 3  . Years of education: Not on file  . Highest education level: Not on file  Occupational History  . Occupation: retired  Scientific laboratory technician  . Financial resource strain: Not on file  . Food insecurity:    Worry: Not on file    Inability: Not on file  . Transportation needs:    Medical: Not on file    Non-medical: Not on file  Tobacco Use  . Smoking status: Never Smoker  . Smokeless tobacco: Never Used  Substance and Sexual Activity  . Alcohol use: Yes    Alcohol/week:  4.0 - 6.0 standard drinks    Types: 4 - 6 Glasses of wine per week    Comment: occasional  . Drug use: No  . Sexual activity: Yes    Partners: Male    Birth control/protection: Surgical    Comment: TVH  Lifestyle  . Physical activity:    Days per week: Not on file    Minutes per session: Not on file  . Stress: Not on file  Relationships  . Social connections:    Talks on phone: Not on file    Gets together: Not on file    Attends religious service: Not on file    Active member of club or organization: Not on file    Attends meetings of clubs or organizations: Not on file    Relationship status: Not on file  Other Topics Concern  . Not on file  Social History Narrative  . Not on file     Family History:  The patient's family history includes Bipolar disorder in her son; Breast cancer in her cousin; Heart disease (age of onset: 59) in her brother; Liver disease in her son; Osteoarthritis in her maternal grandmother; Osteoporosis in her mother; Prostate cancer in her father; Stroke in her mother.   ROS:   Please see the history of present illness.    ROS All other systems reviewed and are negative.   PHYSICAL EXAM:   VS:  BP 118/64   Pulse 68   Ht 5' 5"  (1.651 m)   Wt 151 lb 3.2 oz (68.6 kg)   LMP 04/20/2005   BMI 25.16 kg/m      General: Alert, oriented x3, no distress, lean Head: no evidence of trauma, PERRL, EOMI, no exophtalmos or lid lag, no myxedema, no xanthelasma; normal ears, nose and oropharynx Neck: normal jugular venous pulsations and no hepatojugular reflux; brisk carotid pulses without delay and no carotid bruits Chest: clear to auscultation, no signs of consolidation by percussion or palpation, normal fremitus, symmetrical and full respiratory excursions Cardiovascular: normal position and quality of the apical impulse, regular rhythm, normal first and second heart sounds, no murmurs, rubs or gallops Abdomen: no tenderness or distention, no masses by  palpation, no abnormal pulsatility or arterial bruits, normal bowel sounds, no hepatosplenomegaly Extremities: no clubbing, cyanosis or edema; 2+ radial, ulnar and brachial pulses bilaterally; 2+ right femoral, posterior tibial and dorsalis pedis pulses; 2+ left femoral, posterior tibial and dorsalis pedis pulses; no subclavian or femoral bruits Neurological: grossly nonfocal Psych: Normal mood and affect   Wt Readings from Last 3 Encounters:  07/19/18 151 lb 3.2 oz (68.6 kg)  10/03/17 149 lb (67.6 kg)  09/17/16 124 lb (56.2 kg)     Studies/Labs Reviewed:  EKG:  EKG is ordered today.  Shows normal sinus rhythm with one PAC,  QTc 412 ms  ASSESSMENT:    1. Paroxysmal atrial fibrillation (HCC)      PLAN:  In order of problems listed above:   AFib: The episodes are infrequent and minimally symptomatic.  On beta-blocker and low-dose.  True antiarrhythmics are not indicated.  She is on appropriate anticoagulation. CHADSVAsc 2 (age gender).  Has not had any bleeding complications.   Medication Adjustments/Labs and Tests Ordered: Current medicines are reviewed at length with the patient today.  Concerns regarding medicines are outlined above.  Medication changes, Labs and Tests ordered today are listed in the Patient Instructions below. Patient Instructions  Medication Instructions:  Your physician recommends that you continue on your current medications as directed. Please refer to the Current Medication list given to you today.  If you need a refill on your cardiac medications before your next appointment, please call your pharmacy.   Lab work: None ordered If you have labs (blood work) drawn today and your tests are completely normal, you will receive your results only by: Marland Kitchen MyChart Message (if you have MyChart) OR . A paper copy in the mail If you have any lab test that is abnormal or we need to change your treatment, we will call you to review the  results.  Testing/Procedures: None ordered  Follow-Up: At [facility], you and your health needs are our priority.  As part of our continuing mission to provide you with exceptional heart care, we have created designated Provider Care Teams.  These Care Teams include your primary Cardiologist (physician) and Advanced Practice Providers (APPs -  Physician Assistants and Nurse Practitioners) who all work together to provide you with the care you need, when you need it. You will need a follow up appointment in 12 months.  Please call our office 2 months in advance to schedule this appointment.  You may see Dr.Jolea Dolle or one of the following Advanced Practice Providers on your designated Care Team: Almyra Deforest, [place] . Fabian Sharp, PA-C      Signed, Sanda Klein, MD  07/22/2018 2:48 PM    [address], [place], [place]  [address] Phone: [phone]; Fax: [phone]

## 2018-07-19 NOTE — Patient Instructions (Signed)
Medication Instructions:  Your physician recommends that you continue on your current medications as directed. Please refer to the Current Medication list given to you today.  If you need a refill on your cardiac medications before your next appointment, please call your pharmacy.   Lab work: None ordered If you have labs (blood work) drawn today and your tests are completely normal, you will receive your results only by: Marland Kitchen MyChart Message (if you have MyChart) OR . A paper copy in the mail If you have any lab test that is abnormal or we need to change your treatment, we will call you to review the results.  Testing/Procedures: None ordered  Follow-Up: At [facility], you and your health needs are our priority.  As part of our continuing mission to provide you with exceptional heart care, we have created designated Provider Care Teams.  These Care Teams include your primary Cardiologist (physician) and Advanced Practice Providers (APPs -  Physician Assistants and Nurse Practitioners) who all work together to provide you with the care you need, when you need it. You will need a follow up appointment in 12 months.  Please call our office 2 months in advance to schedule this appointment.  You may see Dr.Croitoru or one of the following Advanced Practice Providers on your designated Care Team: Almyra Deforest, [place] . Fabian Sharp, PA-C

## 2018-07-22 ENCOUNTER — Encounter: Payer: Self-pay | Admitting: Cardiovascular Disease

## 2018-08-14 DIAGNOSIS — Z1231 Encounter for screening mammogram for malignant neoplasm of breast: Secondary | ICD-10-CM | POA: Diagnosis not present

## 2018-09-04 DIAGNOSIS — Z96651 Presence of right artificial knee joint: Secondary | ICD-10-CM | POA: Diagnosis not present

## 2018-09-04 DIAGNOSIS — M810 Age-related osteoporosis without current pathological fracture: Secondary | ICD-10-CM | POA: Diagnosis not present

## 2018-09-04 DIAGNOSIS — Z8262 Family history of osteoporosis: Secondary | ICD-10-CM | POA: Diagnosis not present

## 2018-09-04 DIAGNOSIS — K900 Celiac disease: Secondary | ICD-10-CM | POA: Diagnosis not present

## 2018-09-06 DIAGNOSIS — H40013 Open angle with borderline findings, low risk, bilateral: Secondary | ICD-10-CM | POA: Diagnosis not present

## 2018-09-17 ENCOUNTER — Other Ambulatory Visit: Payer: Self-pay | Admitting: Cardiovascular Disease

## 2018-09-18 NOTE — Telephone Encounter (Signed)
°*  STAT* If patient is at the pharmacy, call can be transferred to refill team.   1. Which medications need to be refilled? (please list name of each medication and dose if known) Metoprolol  2. Which pharmacy/location (including street and city if local pharmacy) is medication to be sent to?[facility] [employer] [address]  3. Do they need a 30 day or 90 day supply? 90 and refills

## 2018-10-20 DIAGNOSIS — E785 Hyperlipidemia, unspecified: Secondary | ICD-10-CM | POA: Diagnosis not present

## 2018-10-20 DIAGNOSIS — Z7982 Long term (current) use of aspirin: Secondary | ICD-10-CM | POA: Diagnosis not present

## 2018-10-20 DIAGNOSIS — Z1159 Encounter for screening for other viral diseases: Secondary | ICD-10-CM | POA: Diagnosis not present

## 2018-10-25 DIAGNOSIS — M19049 Primary osteoarthritis, unspecified hand: Secondary | ICD-10-CM | POA: Diagnosis not present

## 2018-10-25 DIAGNOSIS — Z7901 Long term (current) use of anticoagulants: Secondary | ICD-10-CM | POA: Diagnosis not present

## 2018-10-25 DIAGNOSIS — Z0000 Encounter for general adult medical examination without abnormal findings: Secondary | ICD-10-CM | POA: Diagnosis not present

## 2018-11-29 ENCOUNTER — Other Ambulatory Visit: Payer: Self-pay

## 2018-11-29 ENCOUNTER — Telehealth: Payer: Self-pay | Admitting: Cardiovascular Disease

## 2018-11-29 MED ORDER — APIXABAN 5 MG PO TABS: 5.0000 mg | ORAL_TABLET | Freq: Two times a day (BID) | ORAL | 1 refills | Status: DC

## 2018-11-29 NOTE — Telephone Encounter (Signed)
New Message          *STAT* If patient is at the pharmacy, call can be transferred to refill team.   1. Which medications need to be refilled? (please list name of each medication and dose if known) Eliquis 5 mg  2. Which pharmacy/location (including street and city if local pharmacy) is [facility] [place]   3. Do they need a 30 day or 90 day supply? [place]

## 2018-12-27 DIAGNOSIS — H401111 Primary open-angle glaucoma, right eye, mild stage: Secondary | ICD-10-CM | POA: Diagnosis not present

## 2019-01-08 DIAGNOSIS — M549 Dorsalgia, unspecified: Secondary | ICD-10-CM | POA: Diagnosis not present

## 2019-01-08 DIAGNOSIS — M1990 Unspecified osteoarthritis, unspecified site: Secondary | ICD-10-CM | POA: Diagnosis not present

## 2019-01-08 DIAGNOSIS — M19042 Primary osteoarthritis, left hand: Secondary | ICD-10-CM | POA: Diagnosis not present

## 2019-01-08 DIAGNOSIS — M79642 Pain in left hand: Secondary | ICD-10-CM | POA: Diagnosis not present

## 2019-01-08 DIAGNOSIS — M79641 Pain in right hand: Secondary | ICD-10-CM | POA: Diagnosis not present

## 2019-01-08 DIAGNOSIS — M19041 Primary osteoarthritis, right hand: Secondary | ICD-10-CM | POA: Diagnosis not present

## 2019-01-08 DIAGNOSIS — I480 Paroxysmal atrial fibrillation: Secondary | ICD-10-CM | POA: Diagnosis not present

## 2019-01-08 DIAGNOSIS — M79643 Pain in unspecified hand: Secondary | ICD-10-CM | POA: Diagnosis not present

## 2019-01-08 DIAGNOSIS — M064 Inflammatory polyarthropathy: Secondary | ICD-10-CM | POA: Diagnosis not present

## 2019-02-14 DIAGNOSIS — H35373 Puckering of macula, bilateral: Secondary | ICD-10-CM | POA: Diagnosis not present

## 2019-02-14 DIAGNOSIS — H43813 Vitreous degeneration, bilateral: Secondary | ICD-10-CM | POA: Diagnosis not present

## 2019-02-14 DIAGNOSIS — H31092 Other chorioretinal scars, left eye: Secondary | ICD-10-CM | POA: Diagnosis not present

## 2019-03-13 ENCOUNTER — Telehealth: Payer: Self-pay | Admitting: Cardiovascular Disease

## 2019-03-13 DIAGNOSIS — M25561 Pain in right knee: Secondary | ICD-10-CM | POA: Diagnosis not present

## 2019-03-13 NOTE — Telephone Encounter (Signed)
Yes, I think she should stop her Eliquis. 1-2 weeks should be enough. The likelihood of stroke with a 2 week or less interruption is very low. Please wish her a speedy recovery from me.

## 2019-03-13 NOTE — Telephone Encounter (Signed)
New Message     Pt is wondering if she can stop taking the Eliquis or cut back on it for a couple of weeks,  She says she is having bleeding in her knee     Please call back

## 2019-03-13 NOTE — Telephone Encounter (Signed)
Called patient and advised of message from PharmD. No questions per patient.

## 2019-03-13 NOTE — Telephone Encounter (Signed)
Called patient, she states she had a fall, and now has bleeding at her knee with hematoma. She seen her Orthopeidist this morning and he suggested she call to see if she could back off the Eliquis until this subsided.  Advised with patient I would route a message to MD and PharmD to advise. Thanks!

## 2019-03-20 DIAGNOSIS — M25561 Pain in right knee: Secondary | ICD-10-CM | POA: Diagnosis not present

## 2019-04-03 DIAGNOSIS — R8290 Unspecified abnormal findings in urine: Secondary | ICD-10-CM | POA: Diagnosis not present

## 2019-04-03 DIAGNOSIS — Z1212 Encounter for screening for malignant neoplasm of rectum: Secondary | ICD-10-CM | POA: Diagnosis not present

## 2019-04-11 DIAGNOSIS — H04123 Dry eye syndrome of bilateral lacrimal glands: Secondary | ICD-10-CM | POA: Diagnosis not present

## 2019-05-09 DIAGNOSIS — H40013 Open angle with borderline findings, low risk, bilateral: Secondary | ICD-10-CM | POA: Diagnosis not present

## 2019-06-07 DIAGNOSIS — H40013 Open angle with borderline findings, low risk, bilateral: Secondary | ICD-10-CM | POA: Diagnosis not present

## 2019-06-19 ENCOUNTER — Other Ambulatory Visit: Payer: Self-pay | Admitting: Cardiovascular Disease

## 2019-07-18 ENCOUNTER — Encounter: Payer: Self-pay | Admitting: *Deleted

## 2019-07-18 ENCOUNTER — Telehealth: Payer: Self-pay | Admitting: Cardiovascular Disease

## 2019-07-18 NOTE — Telephone Encounter (Signed)
Spoke with pt who state [place] is requiring approval prior to vaccine due to pt taking eliquis. Informed pt that per e-mail received from Pharm D, being on blood thinners should not stop them from getting their vaccine and like with any other vaccine, she might notice a bruise at the injection site. Pt verbalized understanding and a letter will be placed at front desk for pick up.

## 2019-07-18 NOTE — Telephone Encounter (Signed)
  Patient called the COVID Vaccine registration line and was told she needed permission from her doctor to get the COVID Vaccine. She would like the office to write her a letter so she can get a vaccine

## 2019-08-08 ENCOUNTER — Ambulatory Visit: Payer: Medicare Other | Admitting: Cardiovascular Disease

## 2019-08-08 ENCOUNTER — Encounter: Payer: Self-pay | Admitting: Cardiovascular Disease

## 2019-08-08 ENCOUNTER — Other Ambulatory Visit: Payer: Self-pay

## 2019-08-08 VITALS — BP 104/64 | HR 63 | Ht 65.0 in | Wt 148.0 lb

## 2019-08-08 DIAGNOSIS — I480 Paroxysmal atrial fibrillation: Secondary | ICD-10-CM

## 2019-08-08 MED ORDER — METOPROLOL SUCCINATE ER 25 MG PO TB24: 25.0000 mg | ORAL_TABLET | Freq: Every day | ORAL | 3 refills | Status: DC

## 2019-08-08 NOTE — Progress Notes (Signed)
Cardiology Office Note    Date:  08/15/2019   ID:  Angela Morales, DOB June 10, 1946, MRN [id number]  PCP:  Deland Pretty, MD  Cardiologist:   Sanda Klein, MD   No chief complaint on file.   History of Present Illness:  Angela Morales is a 74 y.o. female with what appears to be "lone" atrial fibrillation with rapid ventricular response.   She has noticed that her palpitations are often brought on by chocolate.  They occur about twice a month including the Monday before this appointment.  When she has palpitations she often feels lightheaded and slightly nauseous but she has not had any syncope, dyspnea or angina.  She has been compliant with her anticoagulant but unfortunately had a right knee hemarthrosis after a mechanical fall.  It has healed nicely and the knee does not bother her anymore.  She has not had any other bleeding complications  Her first detected episode of atrial fibrillation was related to an acute diarrheal illness, eventually proven to the true celiac disease.  Since then her GI symptoms have improved substantially on a gluten-free diet.  Previous workup has demonstrated the absence of significant structural cardiac abnormalities. Both the right and left atrium were described as being normal in size by echocardiography. There are no valvular problems.  Today the rhythm is sinus with PACs.  The patient specifically denies any chest pain at rest exertion, dyspnea at rest or with exertion, orthopnea, paroxysmal nocturnal dyspnea, syncope, palpitations, focal neurological deficits, intermittent claudication, lower extremity edema, unexplained weight gain, cough, hemoptysis or wheezing.   Past Medical History:  Diagnosis Date  . Abnormal uterine bleeding    on HRT  . Allergy    seasonal  . Arthritis   . Atrial fibrillation ([place])   . Celiac disease   . DDD (degenerative disc disease)   . Dysrhythmia   . Glaucoma   . Microscopic colitis   . Osteoarthritis of  both knees   . PONV (postoperative nausea and vomiting)   . SVT (supraventricular tachycardia) (HCC)     Past Surgical History:  Procedure Laterality Date  . APPENDECTOMY    . COLONOSCOPY    . FOOT FRACTURE SURGERY  10/2013  . TONSILLECTOMY AND ADENOIDECTOMY    . TOTAL KNEE ARTHROPLASTY Left 03/22/2016   Procedure: TOTAL KNEE ARTHROPLASTY;  Surgeon: Dorna Leitz, MD;  Location: [facility];  Service: Orthopedics;  Laterality: Left;  . TUBAL LIGATION  1981  . VAGINAL HYSTERECTOMY  04/20/05   prolapse, adenomyosis    Current Medications: Outpatient Medications Prior to Visit  Medication Sig Dispense Refill  . calcium gluconate 500 MG tablet Take 1 tablet by mouth as directed.     Marland Kitchen ELIQUIS 5 MG TABS tablet TAKE 1 TABLET BY MOUTH TWICE A DAY 180 tablet 1  . ibuprofen (ADVIL,MOTRIN) 200 MG tablet Take 200 mg by mouth every 6 (six) hours as needed.    . loratadine (CLARITIN) 10 MG tablet Take 10 mg by mouth as needed.     . psyllium (METAMUCIL) 58.6 % packet Take 1 packet by mouth daily.    . timolol (TIMOPTIC) 0.5 % ophthalmic solution Place 1 drop into both eyes 3 (three) times daily.    . XELPROS 0.005 % EMUL Apply 1 drop to eye at bedtime.    . metoprolol succinate (TOPROL-XL) 25 MG 24 hr tablet TAKE 1 TABLET BY MOUTH EVERY DAY 90 tablet 3   Facility-Administered Medications Prior to Visit  Medication Dose Route  Frequency Provider Last Rate Last Admin  . 0.9 %  sodium chloride infusion  500 mL Intravenous Continuous [name], Kirke Corin, MD         Allergies:   Cefdinir   Social History   Socioeconomic History  . Marital status: Married    Spouse name: Not on file  . Number of children: 3  . Years of education: Not on file  . Highest education level: Not on file  Occupational History  . Occupation: retired  Tobacco Use  . Smoking status: Never Smoker  . Smokeless tobacco: Never Used  Substance and Sexual Activity  . Alcohol use: Yes    Alcohol/week: 4.0 - 6.0 standard drinks     Types: 4 - 6 Glasses of wine per week    Comment: occasional  . Drug use: No  . Sexual activity: Yes    Partners: Male    Birth control/protection: Surgical    Comment: TVH  Other Topics Concern  . Not on file  Social History Narrative  . Not on file   Social Determinants of Health   Financial Resource Strain:   . Difficulty of Paying Living Expenses: Not on file  Food Insecurity:   . Worried About Charity fundraiser in the Last Year: Not on file  . Ran Out of Food in the Last Year: Not on file  Transportation Needs:   . Lack of Transportation (Medical): Not on file  . Lack of Transportation (Non-Medical): Not on file  Physical Activity:   . Days of Exercise per Week: Not on file  . Minutes of Exercise per Session: Not on file  Stress:   . Feeling of Stress : Not on file  Social Connections:   . Frequency of Communication with Friends and Family: Not on file  . Frequency of Social Gatherings with Friends and Family: Not on file  . Attends Religious Services: Not on file  . Active Member of Clubs or Organizations: Not on file  . Attends Archivist Meetings: Not on file  . Marital Status: Not on file     Family History:  The patient's family history includes Bipolar disorder in her son; Breast cancer in her cousin; Heart disease (age of onset: 39) in her brother; Liver disease in her son; Osteoarthritis in her maternal grandmother; Osteoporosis in her mother; Prostate cancer in her father; Stroke in her mother.   ROS:   Please see the history of present illness.    ROS All other systems reviewed and are negative.   PHYSICAL EXAM:   VS:  BP 104/64   Pulse 63   Ht 5' 5"  (1.651 m)   Wt 148 lb (67.1 kg)   LMP 04/20/2005   BMI 24.63 kg/m       General: Alert, oriented x3, no distress, appears lean and fit Head: no evidence of trauma, PERRL, EOMI, no exophtalmos or lid lag, no myxedema, no xanthelasma; normal ears, nose and oropharynx Neck: normal  jugular venous pulsations and no hepatojugular reflux; brisk carotid pulses without delay and no carotid bruits Chest: clear to auscultation, no signs of consolidation by percussion or palpation, normal fremitus, symmetrical and full respiratory excursions Cardiovascular: normal position and quality of the apical impulse, regular rhythm, normal first and second heart sounds, no murmurs, rubs or gallops Abdomen: no tenderness or distention, no masses by palpation, no abnormal pulsatility or arterial bruits, normal bowel sounds, no hepatosplenomegaly Extremities: no clubbing, cyanosis or edema; 2+ radial, ulnar and  brachial pulses bilaterally; 2+ right femoral, posterior tibial and dorsalis pedis pulses; 2+ left femoral, posterior tibial and dorsalis pedis pulses; no subclavian or femoral bruits Neurological: grossly nonfocal Psych: Normal mood and affect   Wt Readings from Last 3 Encounters:  08/08/19 148 lb (67.1 kg)  07/19/18 151 lb 3.2 oz (68.6 kg)  10/03/17 149 lb (67.6 kg)     Studies/Labs Reviewed:   EKG:  EKG is ordered today.  Shows normal sinus rhythm, normal tracing  BMET    Component Value Date/Time   NA 139 07/21/2016 1022   K 4.5 07/21/2016 1022   CL 104 07/21/2016 1022   CO2 30 07/21/2016 1022   GLUCOSE 97 07/21/2016 1022   BUN 10 07/21/2016 1022   CREATININE 0.54 07/21/2016 1022   CALCIUM 9.5 07/21/2016 1022   GFRNONAA >60 03/23/2016 0315   GFRAA >60 03/23/2016 0315  10/20/2018 creatinine 0.77, normal liver function tests  Lipid Panel  No results found for: CHOL, TRIG, HDL, CHOLHDL, VLDL, LDLCALC, LDLDIRECT, LABVLDL  10/20/2018 Total cholesterol 184, HDL 55, triglycerides 91  ASSESSMENT:    1. Paroxysmal atrial fibrillation (HCC)      PLAN:  In order of problems listed above:   AFib: Her episodes remain infrequent and minimally symptomatic fairly well controlled on a low-dose of beta-blocker.  She has had one serious bleeding complication with the  Eliquis after a fall but otherwise tolerates it well.  True antiarrhythmics do not appear to be indicated.  We will continue the same treatment.   Medication Adjustments/Labs and Tests Ordered: Current medicines are reviewed at length with the patient today.  Concerns regarding medicines are outlined above.  Medication changes, Labs and Tests ordered today are listed in the Patient Instructions below. Patient Instructions  .Medication Instructions:  Your physician recommends that you continue on your current medications as directed. Please refer to the Current Medication list given to you today.  *If you need a refill on your cardiac medications before your next appointment, please call your pharmacy*  Follow-Up: At [facility], you and your health needs are our priority.  As part of our continuing mission to provide you with exceptional heart care, we have created designated Provider Care Teams.  These Care Teams include your primary Cardiologist (physician) and Advanced Practice Providers (APPs -  Physician Assistants and Nurse Practitioners) who all work together to provide you with the care you need, when you need it.  Your next appointment:   12 month(s)  The format for your next appointment:   In Person  Provider:   You may see Sanda Klein, MD or one of the following Advanced Practice Providers on your designated Care Team:    Almyra Deforest, PA-C  Fabian Sharp, [place] or   Roby Lofts, [place]   Other Instructions Please call our office 2 months in advance to schedule your one year follow-up appointment.     Signed, Sanda Klein, MD  08/15/2019 5:47 PM    [address], [place], [place]  [address] Phone: [phone]; Fax: [phone]

## 2019-08-08 NOTE — Patient Instructions (Signed)
.  Medication Instructions:  Your physician recommends that you continue on your current medications as directed. Please refer to the Current Medication list given to you today.  *If you need a refill on your cardiac medications before your next appointment, please call your pharmacy*  Follow-Up: At [facility], you and your health needs are our priority.  As part of our continuing mission to provide you with exceptional heart care, we have created designated Provider Care Teams.  These Care Teams include your primary Cardiologist (physician) and Advanced Practice Providers (APPs -  Physician Assistants and Nurse Practitioners) who all work together to provide you with the care you need, when you need it.  Your next appointment:   12 month(s)  The format for your next appointment:   In Person  Provider:   You may see Sanda Klein, MD or one of the following Advanced Practice Providers on your designated Care Team:    Almyra Deforest, PA-C  Fabian Sharp, [place] or   Roby Lofts, [place]   Other Instructions Please call our office 2 months in advance to schedule your one year follow-up appointment.

## 2019-08-15 ENCOUNTER — Encounter: Payer: Self-pay | Admitting: Cardiovascular Disease

## 2019-08-20 DIAGNOSIS — Z1231 Encounter for screening mammogram for malignant neoplasm of breast: Secondary | ICD-10-CM | POA: Diagnosis not present

## 2019-09-05 DIAGNOSIS — H401131 Primary open-angle glaucoma, bilateral, mild stage: Secondary | ICD-10-CM | POA: Diagnosis not present

## 2019-10-24 ENCOUNTER — Encounter: Payer: Self-pay | Admitting: Cardiovascular Disease

## 2019-10-24 DIAGNOSIS — M810 Age-related osteoporosis without current pathological fracture: Secondary | ICD-10-CM | POA: Diagnosis not present

## 2019-10-24 DIAGNOSIS — E785 Hyperlipidemia, unspecified: Secondary | ICD-10-CM | POA: Diagnosis not present

## 2019-10-24 DIAGNOSIS — N390 Urinary tract infection, site not specified: Secondary | ICD-10-CM | POA: Diagnosis not present

## 2019-10-31 DIAGNOSIS — Z0000 Encounter for general adult medical examination without abnormal findings: Secondary | ICD-10-CM | POA: Diagnosis not present

## 2019-10-31 DIAGNOSIS — M8580 Other specified disorders of bone density and structure, unspecified site: Secondary | ICD-10-CM | POA: Diagnosis not present

## 2019-10-31 DIAGNOSIS — M19049 Primary osteoarthritis, unspecified hand: Secondary | ICD-10-CM | POA: Diagnosis not present

## 2019-10-31 DIAGNOSIS — I480 Paroxysmal atrial fibrillation: Secondary | ICD-10-CM | POA: Diagnosis not present

## 2019-10-31 DIAGNOSIS — K900 Celiac disease: Secondary | ICD-10-CM | POA: Diagnosis not present

## 2019-12-19 ENCOUNTER — Other Ambulatory Visit: Payer: Self-pay | Admitting: Cardiovascular Disease

## 2020-01-10 DIAGNOSIS — H40013 Open angle with borderline findings, low risk, bilateral: Secondary | ICD-10-CM | POA: Diagnosis not present

## 2020-02-26 DIAGNOSIS — H401113 Primary open-angle glaucoma, right eye, severe stage: Secondary | ICD-10-CM | POA: Diagnosis not present

## 2020-02-26 DIAGNOSIS — H401121 Primary open-angle glaucoma, left eye, mild stage: Secondary | ICD-10-CM | POA: Diagnosis not present

## 2020-02-26 DIAGNOSIS — Z961 Presence of intraocular lens: Secondary | ICD-10-CM | POA: Diagnosis not present

## 2020-03-17 ENCOUNTER — Telehealth: Payer: Self-pay | Admitting: Cardiovascular Disease

## 2020-03-17 NOTE — Telephone Encounter (Signed)
I called and spoke with Mrs Angela Morales this morning.  She is not having palpitations today and was a one time incident yesterday while traveling and "doing a lot of things", but she was very careful to only have one cup of coffee and no chocolate. She did not want to be evaluated in ED for EKG while traveling.  She is back home now and lives close by if it should be recommended for her to come in to office. Please advise.

## 2020-03-17 NOTE — Telephone Encounter (Signed)
Patient c/o Palpitations:  High priority if patient c/o lightheadedness, shortness of breath, or chest pain  1) How long have you had palpitations/irregular HR/ Afib? Are you having the symptoms now? Yesterday, not having symptoms now  2) Are you currently experiencing lightheadedness, SOB or CP? Chest pain yesterday, dizziness yesterday  3) Do you have a history of afib (atrial fibrillation) or irregular heart rhythm? yes  4) Have you checked your BP or HR? (document readings if available): HR was irregular ranging 90-120  5) Are you experiencing any other symptoms? No   Patient states yesterday she had an episode of afib and had chest pain. She states she is not having symptoms now.

## 2020-03-17 NOTE — Telephone Encounter (Signed)
Returned the call to the patient. She has been made aware of the recommendations and has been complaint with the Eliquis. She will keep the office updated.

## 2020-03-17 NOTE — Telephone Encounter (Signed)
Occasional breakthrough atrial fibrillation is to be expected. If compliant with Eliquis, should be well protected from stroke. If symptoms have resolved, no need for an early office visit and definitely no need for ED visit. If the episodes increase in frequency, we can discuss changes in meds.

## 2020-04-30 DIAGNOSIS — I4891 Unspecified atrial fibrillation: Secondary | ICD-10-CM | POA: Diagnosis not present

## 2020-04-30 DIAGNOSIS — Z79899 Other long term (current) drug therapy: Secondary | ICD-10-CM | POA: Diagnosis not present

## 2020-04-30 DIAGNOSIS — M1990 Unspecified osteoarthritis, unspecified site: Secondary | ICD-10-CM | POA: Diagnosis not present

## 2020-04-30 DIAGNOSIS — H401112 Primary open-angle glaucoma, right eye, moderate stage: Secondary | ICD-10-CM | POA: Diagnosis not present

## 2020-04-30 DIAGNOSIS — Z7901 Long term (current) use of anticoagulants: Secondary | ICD-10-CM | POA: Diagnosis not present

## 2020-06-16 ENCOUNTER — Other Ambulatory Visit: Payer: Self-pay | Admitting: Cardiovascular Disease

## 2020-08-05 DIAGNOSIS — H401121 Primary open-angle glaucoma, left eye, mild stage: Secondary | ICD-10-CM | POA: Diagnosis not present

## 2020-08-12 NOTE — Progress Notes (Unsigned)
Cardiology Office Note:    Date:  08/13/2020   ID:  Angela Morales, DOB 1945/11/05, MRN [id number]  PCP:  Merri Brunette, MD  Cardiologist:  Thurmon Fair, MD   Referring MD: Merri Brunette, MD   Chief Complaint  Patient presents with  . Follow-up  PAF  History of Present Illness:    Angela Morales is a 75 y.o. female with a hx of PAF on eliquis and celiac disease. Afib first diagnosed in the setting of diarrhea, later diagnosed with celiac disease. She is anticoagulated with eliquis. She has occasional palpitations.  She last saw Dr. Royann Shivers 06/2018 and was doing well at that time. She did have an episode of palpitations while traveling, she opted to not be seen at an outside ER. She had not missed eliquis.   She presents today for routine follow up. She is in atrial flutter today, rate controlled in the 90s. However, was very regular on exam. She has been having bouts of flutter fo the past two days. She denies dizziness or syncope. She continues to to be very careful with caffeine and her celiac disease - arrhythmia can occur with accidental gluten intake. She does report chest pain after lifting or carrying things consistent with costochondritis. 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   She is having significant back pain that is limiting her mobility. She is due for back injection with MRI and asks about holding eliquis. [facility] nurse told her she had reduced circulation in her feet. She reports numbness and tinlging in her feet related to back pain. Feet and legs are cool with weak pulses.    Past Medical History:  Diagnosis Date  . Abnormal uterine bleeding    on HRT  . Allergy    seasonal  . Arthritis   . Atrial fibrillation (HCC)   . Celiac disease   . DDD (degenerative disc disease)   . Dysrhythmia   . Glaucoma   . Microscopic colitis   . Osteoarthritis of both knees   . PONV (postoperative nausea and vomiting)   . SVT (supraventricular tachycardia) (HCC)      Past Surgical History:  Procedure Laterality Date  . APPENDECTOMY    . COLONOSCOPY    . FOOT FRACTURE SURGERY  10/2013  . TONSILLECTOMY AND ADENOIDECTOMY    . TOTAL KNEE ARTHROPLASTY Left 03/22/2016   Procedure: TOTAL KNEE ARTHROPLASTY;  Surgeon: Jodi Geralds, MD;  Location: [facility] OR;  Service: Orthopedics;  Laterality: Left;  . TUBAL LIGATION  1981  . VAGINAL HYSTERECTOMY  04/20/05   prolapse, adenomyosis    Current Medications: Current Meds  Medication Sig  . calcium gluconate 500 MG tablet Take 1 tablet by mouth as directed.   Marland Kitchen ELIQUIS 5 MG TABS tablet TAKE 1 TABLET BY MOUTH TWICE A DAY  . ibuprofen (ADVIL,MOTRIN) 200 MG tablet Take 200 mg by mouth every 6 (six) hours as needed.  . loratadine (CLARITIN) 10 MG tablet Take 10 mg by mouth as needed.   . metoprolol succinate (TOPROL-XL) 25 MG 24 hr tablet Take 1 tablet (25 mg total) by mouth daily.  . psyllium (METAMUCIL) 58.6 % packet Take 1 packet by mouth daily.  . timolol (TIMOPTIC) 0.5 % ophthalmic solution Place 1 drop into both eyes 3 (three) times daily.  . XELPROS 0.005 % EMUL Apply 1 drop to eye at bedtime.   Current Facility-Administered Medications for the 08/13/20 encounter (Office Visit) with Marcelino Duster, PA  Medication  . 0.9 %  sodium chloride infusion     Allergies:   Cefdinir   Social History   Socioeconomic History  . Marital status: Married    Spouse name: Not on file  . Number of children: 3  . Years of education: Not on file  . Highest education level: Not on file  Occupational History  . Occupation: retired  Tobacco Use  . Smoking status: Never Smoker  . Smokeless tobacco: Never Used  Substance and Sexual Activity  . Alcohol use: Yes    Alcohol/week: 4.0 - 6.0 standard drinks    Types: 4 - 6 Glasses of wine per week    Comment: occasional  . Drug use: No  . Sexual activity: Yes    Partners: Male    Birth control/protection: Surgical    Comment: TVH  Other Topics Concern  . Not on  file  Social History Narrative  . Not on file   Social Determinants of Health   Financial Resource Strain: Not on file  Food Insecurity: Not on file  Transportation Needs: Not on file  Physical Activity: Not on file  Stress: Not on file  Social Connections: Not on file     Family History: The patient's family history includes Bipolar disorder in her son; Breast cancer in her cousin; Heart disease (age of onset: 35) in her brother; Liver disease in her son; Osteoarthritis in her maternal grandmother; Osteoporosis in her mother; Prostate cancer in her father; Stroke in her mother. There is no history of Colon cancer.  ROS:   Please see the history of present illness.     All other systems reviewed and are negative.  EKGs/Labs/Other Studies Reviewed:    The following studies were reviewed today:  none  EKG:  EKG is  ordered today.  The ekg ordered today demonstrates atrial flutter with ventricular rate 94  Recent Labs: No results found for requested labs within last 8760 hours.  Recent Lipid Panel No results found for: CHOL, TRIG, HDL, CHOLHDL, VLDL, LDLCALC, LDLDIRECT  Physical Exam:    VS:  BP 120/82   Pulse 94   Ht 5\' 3"  (1.6 m)   Wt 143 lb (64.9 kg)   LMP 04/20/2005   SpO2 98%   BMI 25.33 kg/m     Wt Readings from Last 3 Encounters:  08/13/20 143 lb (64.9 kg)  08/08/19 148 lb (67.1 kg)  07/19/18 151 lb 3.2 oz (68.6 kg)     GEN:  Well nourished, well developed in no acute distress HEENT: Normal NECK: No JVD; No carotid bruits LYMPHATICS: No lymphadenopathy CARDIAC: RRR, no murmurs, rubs, gallops RESPIRATORY:  Clear to auscultation without rales, wheezing or rhonchi  ABDOMEN: Soft, non-tender, non-distended MUSCULOSKELETAL:  No edema; No deformity  SKIN: Warm and dry NEUROLOGIC:  Alert and oriented x 3 PSYCHIATRIC:  Normal affect   ASSESSMENT:    1. Paroxysmal atrial fibrillation (HCC)   2. Cold extremities   3. Chronic anticoagulation   4.  Costochondritis    PLAN:    In order of problems listed above:  Paroxysmal atrial fibrillation (HCC) - Plan: EKG 12-Lead, CBC, TSH, Magnesium, VAS 07/21/18 LOWER EXTREMITY ARTERIAL DUPLEX, VAS [place] ABI WITH/WO TBI  Cold extremities - Plan: EKG 12-Lead, CBC, TSH, Magnesium, VAS [place] LOWER EXTREMITY ARTERIAL DUPLEX, VAS [place] ABI WITH/WO TBI  Chronic anticoagulation  Costochondritis  PAF Chronic anticoagulation - infrequent palpitations - continue BB - continue eliquis - no bleeding problems - discussed taking an dextra 12.5-25 mg toprol for palpitations -  no recent CBC, TSH (2018), or Mg - will collect these today, need CBC for final eliquis recommendations if she needs to hold for a back injection   Cool extremity - obtain ABI with reflex arterial duplex   Back pain - she will need MRI and back injection - if she is having a spinal injection, will need to hold eliquis for three days - will await official preop request - she is cleared for injection without additional testing   MSK chest pain - conservative management   Medication Adjustments/Labs and Tests Ordered: Current medicines are reviewed at length with the patient today.  Concerns regarding medicines are outlined above.  Orders Placed This Encounter  Procedures  . CBC  . TSH  . Magnesium  . EKG 12-Lead  . VAS [place] LOWER EXTREMITY ARTERIAL DUPLEX  . VAS [place] ABI WITH/WO TBI   No orders of the defined types were placed in this encounter.   Signed, Marcelino Duster, [place]  08/13/2020 11:33 AM    [facility]

## 2020-08-13 ENCOUNTER — Encounter: Payer: Self-pay | Admitting: Physician Assistant

## 2020-08-13 ENCOUNTER — Other Ambulatory Visit: Payer: Self-pay

## 2020-08-13 ENCOUNTER — Telehealth: Payer: Self-pay

## 2020-08-13 ENCOUNTER — Ambulatory Visit: Payer: Medicare Other | Admitting: Physician Assistant

## 2020-08-13 VITALS — BP 120/82 | HR 94 | Ht 63.0 in | Wt 143.0 lb

## 2020-08-13 DIAGNOSIS — R209 Unspecified disturbances of skin sensation: Secondary | ICD-10-CM

## 2020-08-13 DIAGNOSIS — M940 Chondrocostal junction syndrome [Tietze]: Secondary | ICD-10-CM | POA: Diagnosis not present

## 2020-08-13 DIAGNOSIS — I480 Paroxysmal atrial fibrillation: Secondary | ICD-10-CM

## 2020-08-13 DIAGNOSIS — Z7901 Long term (current) use of anticoagulants: Secondary | ICD-10-CM | POA: Diagnosis not present

## 2020-08-13 LAB — MAGNESIUM: Magnesium: 2.2 mg/dL (ref 1.6–2.3)

## 2020-08-13 LAB — TSH: TSH: 1.76 u[IU]/mL (ref 0.450–4.500)

## 2020-08-13 LAB — CBC
Hematocrit: 47.2 % — ABNORMAL HIGH (ref 34.0–46.6)
Hemoglobin: 15.7 g/dL (ref 11.1–15.9)
MCH: 32.8 pg (ref 26.6–33.0)
MCHC: 33.3 g/dL (ref 31.5–35.7)
MCV: 99 fL — ABNORMAL HIGH (ref 79–97)
Platelets: 241 10*3/uL (ref 150–450)
RBC: 4.78 x10E6/uL (ref 3.77–5.28)
RDW: 12.1 % (ref 11.7–15.4)
WBC: 7.9 10*3/uL (ref 3.4–10.8)

## 2020-08-13 NOTE — Patient Instructions (Signed)
Medication Instructions:  No Changes *If you need a refill on your cardiac medications before your next appointment, please call your pharmacy*   Lab Work: CBC,Magnesium,TSH If you have labs (blood work) drawn today and your tests are completely normal, you will receive your results only by: Marland Kitchen MyChart Message (if you have MyChart) OR . A paper copy in the mail If you have any lab test that is abnormal or we need to change your treatment, we will call you to review the results.   Testing/Procedures: [place], [address] Your physician has requested that you have a lower or upper extremity venous duplex. This test is an ultrasound of the veins in the legs or arms. It looks at venous blood flow that carries blood from the heart to the legs or arms. Allow one hour for a Lower Venous exam. Allow thirty minutes for an Upper Venous exam. There are no restrictions or special instructions.  Your physician has requested that you have an ankle brachial index (ABI). During this test an ultrasound and blood pressure cuff are used to evaluate the arteries that supply the arms and legs with blood. Allow thirty minutes for this exam. There are no restrictions or special instructions.    Follow-Up: At [facility], you and your health needs are our priority.  As part of our continuing mission to provide you with exceptional heart care, we have created designated Provider Care Teams.  These Care Teams include your primary Cardiologist (physician) and Advanced Practice Providers (APPs -  Physician Assistants and Nurse Practitioners) who all work together to provide you with the care you need, when you need it.  We recommend signing up for the patient portal called "MyChart".  Sign up information is provided on this After Visit Summary.  MyChart is used to connect with patients for Virtual Visits (Telemedicine).  Patients are able to view lab/test results, encounter notes, upcoming appointments,  etc.  Non-urgent messages can be sent to your provider as well.   To learn more about what you can do with MyChart, go to [email or web address].    Your next appointment:   1 year(s)  The format for your next appointment:   In Person  Provider:   Sanda Klein, MD

## 2020-08-13 NOTE — Progress Notes (Signed)
Agree that it looks like flutter. Can we make her an appt after her back injection + 4 weeks, to see if arrhythmia persists and needs to be cardioverted, please? Maybe something available w me in April. Thanks

## 2020-08-15 ENCOUNTER — Other Ambulatory Visit: Payer: Self-pay | Admitting: Physician Assistant

## 2020-08-15 DIAGNOSIS — R209 Unspecified disturbances of skin sensation: Secondary | ICD-10-CM

## 2020-08-24 ENCOUNTER — Other Ambulatory Visit: Payer: Self-pay | Admitting: Cardiovascular Disease

## 2020-08-25 DIAGNOSIS — M4126 Other idiopathic scoliosis, lumbar region: Secondary | ICD-10-CM | POA: Diagnosis not present

## 2020-08-25 DIAGNOSIS — Z1231 Encounter for screening mammogram for malignant neoplasm of breast: Secondary | ICD-10-CM | POA: Diagnosis not present

## 2020-08-26 ENCOUNTER — Other Ambulatory Visit: Payer: Self-pay

## 2020-08-26 DIAGNOSIS — M545 Low back pain, unspecified: Secondary | ICD-10-CM

## 2020-08-26 DIAGNOSIS — M5442 Lumbago with sciatica, left side: Secondary | ICD-10-CM

## 2020-08-29 ENCOUNTER — Other Ambulatory Visit: Payer: Self-pay

## 2020-08-29 ENCOUNTER — Ambulatory Visit (HOSPITAL_COMMUNITY)
Admission: RE | Admit: 2020-08-29 | Discharge: 2020-08-29 | Disposition: A | Discharge status: Home / Self Care | Payer: Medicare Other | Source: Ambulatory Visit | Attending: Cardiology | Admitting: Cardiology

## 2020-08-29 DIAGNOSIS — R0989 Other specified symptoms and signs involving the circulatory and respiratory systems: Secondary | ICD-10-CM

## 2020-08-29 DIAGNOSIS — I480 Paroxysmal atrial fibrillation: Secondary | ICD-10-CM | POA: Diagnosis not present

## 2020-08-29 DIAGNOSIS — R209 Unspecified disturbances of skin sensation: Secondary | ICD-10-CM

## 2020-09-08 ENCOUNTER — Other Ambulatory Visit: Payer: Self-pay

## 2020-09-08 ENCOUNTER — Ambulatory Visit
Admission: RE | Admit: 2020-09-08 | Discharge: 2020-09-08 | Disposition: A | Discharge status: Home / Self Care | Payer: Medicare Other | Source: Ambulatory Visit | Attending: Orthopedic Surgery | Admitting: Orthopedic Surgery

## 2020-09-08 DIAGNOSIS — M5442 Lumbago with sciatica, left side: Secondary | ICD-10-CM

## 2020-09-08 DIAGNOSIS — M48061 Spinal stenosis, lumbar region without neurogenic claudication: Secondary | ICD-10-CM | POA: Diagnosis not present

## 2020-09-08 DIAGNOSIS — M545 Low back pain, unspecified: Secondary | ICD-10-CM

## 2020-09-09 DIAGNOSIS — M419 Scoliosis, unspecified: Secondary | ICD-10-CM | POA: Diagnosis not present

## 2020-09-09 DIAGNOSIS — M48062 Spinal stenosis, lumbar region with neurogenic claudication: Secondary | ICD-10-CM | POA: Diagnosis not present

## 2020-09-15 ENCOUNTER — Encounter: Payer: Self-pay | Admitting: *Deleted

## 2020-09-18 DIAGNOSIS — M47816 Spondylosis without myelopathy or radiculopathy, lumbar region: Secondary | ICD-10-CM | POA: Diagnosis not present

## 2020-10-16 DIAGNOSIS — M419 Scoliosis, unspecified: Secondary | ICD-10-CM | POA: Diagnosis not present

## 2020-10-17 ENCOUNTER — Encounter: Payer: Self-pay | Admitting: Cardiovascular Disease

## 2020-10-17 ENCOUNTER — Other Ambulatory Visit: Payer: Self-pay

## 2020-10-17 ENCOUNTER — Ambulatory Visit: Payer: Medicare Other | Admitting: Cardiovascular Disease

## 2020-10-17 VITALS — BP 122/84 | HR 84 | Ht 64.0 in | Wt 142.6 lb

## 2020-10-17 DIAGNOSIS — I480 Paroxysmal atrial fibrillation: Secondary | ICD-10-CM

## 2020-10-17 DIAGNOSIS — I4819 Other persistent atrial fibrillation: Secondary | ICD-10-CM | POA: Diagnosis not present

## 2020-10-17 NOTE — Patient Instructions (Signed)
Medication Instructions:  No changes  *If you need a refill on your cardiac medications before your next appointment, please call your pharmacy*   Follow-Up: At [facility], you and your health needs are our priority.  As part of our continuing mission to provide you with exceptional heart care, we have created designated Provider Care Teams.  These Care Teams include your primary Cardiologist (physician) and Advanced Practice Providers (APPs -  Physician Assistants and Nurse Practitioners) who all work together to provide you with the care you need, when you need it.  We recommend signing up for the patient portal called "MyChart".  Sign up information is provided on this After Visit Summary.  MyChart is used to connect with patients for Virtual Visits (Telemedicine).  Patients are able to view lab/test results, encounter notes, upcoming appointments, etc.  Non-urgent messages can be sent to your provider as well.   To learn more about what you can do with MyChart, go to [email or web address].    Your next appointment:   Follow up with a-fib clinic 2 weeks after cardioversion. Follow up in 3 months with Dr. Sallyanne Morales.   Other Instructions Dear [name] are scheduled for a Cardioversion on May 5 with Dr. Marlou Morales.  Please arrive at the [facility] (Main Entrance A) at [facility]: [address] [place], [place] [address] at 8:30am. (1 hour prior to procedure unless lab work is needed; if lab work is needed arrive 1.5 hours ahead)  DIET: Nothing to eat or drink after midnight except a sip of water with medications (see medication instructions below)  Medication Instructions: Nothing to hold.   Continue your anticoagulant: Eliquis. If you miss a dose, call the office immediately, as your cardioversion may have to be re-scheduled.   You will need to continue your anticoagulant after your procedure until you are told by your  Provider that it is safe to stop   Labs: On  May 2, come to the [facility] or any [facility] for BMet, CBC.   On May 2, You will need to have the coronavirus test completed prior to your procedure. An appointment has been made at 9:25am on  Oct 27, 2020. This is a Drive Up Visit at the [address] [place]. Please tell them that you are there for pre-procedure testing. Someone will direct you to the appropriate testing line. Stay in your car and someone will be with you shortly. Please make sure to have all other labs completed before this test because you will need to stay quarantined until your procedure. Please take your insurance card to this test.     You must have a responsible person to drive you home and stay in the waiting area during your procedure. Failure to do so could result in cancellation.  Bring your insurance cards.  *Special Note: Every effort is made to have your procedure done on time. Occasionally there are emergencies that occur at the hospital that may cause delays. Please be patient if a delay does occur.

## 2020-10-17 NOTE — H&P (View-Only) (Signed)
Cardiology Office Note    Date:  10/18/2020   ID:  Angela Morales, DOB February 17, 1946, MRN [id number]  PCP:  Deland Pretty, MD  Cardiologist:   Sanda Klein, MD   Chief Complaint  Patient presents with  . Atrial Fibrillation    History of Present Illness:  Angela Morales is a 75 y.o. female with what appears to be "lone" atrial fibrillation.  She is a retired Designer, jewellery.  For the first time this year she has developed persistent atrial fibrillation.  This is well rate controlled.  Nevertheless, she has some increase in fatigue and shortness of breath since the onset of atrial fibrillation.  She had a recent spinal injection, but did not stop her Eliquis for that procedure.  She has not had recent bleeding problems, falls/injuries, but does have a remote history of traumatic right knee hemarthrosis.  She is aware of palpitations and denies dizziness, syncope, edema, neurological complaints, chest pain at rest or with activity.  She does not have orthopnea or PND and develops shortness of breath only with moderate activity (NYHA functional class II.   Her first detected episode of atrial fibrillation was related to an acute diarrheal illness, eventually proven to the true celiac disease.  Since then her GI symptoms have improved substantially on a gluten-free diet.  Previous workup has demonstrated the absence of significant structural cardiac abnormalities. Both the right and left atrium were described as being normal in size by echocardiography. There are no valvular problems.  the rhythm is sinus with PACs.  Past Medical History:  Diagnosis Date  . Abnormal uterine bleeding    on HRT  . Allergy    seasonal  . Arthritis   . Atrial fibrillation ([place])   . Celiac disease   . DDD (degenerative disc disease)   . Dysrhythmia   . Glaucoma   . Microscopic colitis   . Osteoarthritis of both knees   . PONV (postoperative nausea and vomiting)   . SVT (supraventricular  tachycardia) (HCC)     Past Surgical History:  Procedure Laterality Date  . APPENDECTOMY    . COLONOSCOPY    . FOOT FRACTURE SURGERY  10/2013  . TONSILLECTOMY AND ADENOIDECTOMY    . TOTAL KNEE ARTHROPLASTY Left 03/22/2016   Procedure: TOTAL KNEE ARTHROPLASTY;  Surgeon: Dorna Leitz, MD;  Location: [facility];  Service: Orthopedics;  Laterality: Left;  . TUBAL LIGATION  1981  . VAGINAL HYSTERECTOMY  04/20/05   prolapse, adenomyosis    Current Medications: Outpatient Medications Prior to Visit  Medication Sig Dispense Refill  . calcium gluconate 500 MG tablet Take 1 tablet by mouth as directed.     Marland Kitchen ELIQUIS 5 MG TABS tablet TAKE 1 TABLET BY MOUTH TWICE A DAY 180 tablet 1  . ibuprofen (ADVIL,MOTRIN) 200 MG tablet Take 200 mg by mouth every 6 (six) hours as needed.    . loratadine (CLARITIN) 10 MG tablet Take 10 mg by mouth as needed.     . metoprolol succinate (TOPROL-XL) 25 MG 24 hr tablet TAKE 1 TABLET BY MOUTH EVERY DAY 90 tablet 3  . psyllium (METAMUCIL) 58.6 % packet Take 1 packet by mouth daily.    . timolol (TIMOPTIC) 0.5 % ophthalmic solution Place 1 drop into both eyes 3 (three) times daily.    . XELPROS 0.005 % EMUL Apply 1 drop to eye at bedtime.     Facility-Administered Medications Prior to Visit  Medication Dose Route Frequency Provider Last Rate Last Admin  .  0.9 %  sodium chloride infusion  500 mL Intravenous Continuous [name], Kirke Corin, MD         Allergies:   Cefdinir   Social History   Socioeconomic History  . Marital status: Married    Spouse name: Not on file  . Number of children: 3  . Years of education: Not on file  . Highest education level: Not on file  Occupational History  . Occupation: retired  Tobacco Use  . Smoking status: Never Smoker  . Smokeless tobacco: Never Used  Substance and Sexual Activity  . Alcohol use: Yes    Alcohol/week: 4.0 - 6.0 standard drinks    Types: 4 - 6 Glasses of wine per week    Comment: occasional  . Drug use: No   . Sexual activity: Yes    Partners: Male    Birth control/protection: Surgical    Comment: TVH  Other Topics Concern  . Not on file  Social History Narrative  . Not on file   Social Determinants of Health   Financial Resource Strain: Not on file  Food Insecurity: Not on file  Transportation Needs: Not on file  Physical Activity: Not on file  Stress: Not on file  Social Connections: Not on file     Family History:  The patient's family history includes Bipolar disorder in her son; Breast cancer in her cousin; Heart disease (age of onset: 31) in her brother; Liver disease in her son; Osteoarthritis in her maternal grandmother; Osteoporosis in her mother; Prostate cancer in her father; Stroke in her mother.   ROS:   Please see the history of present illness.    ROS All other systems reviewed and are negative.   PHYSICAL EXAM:   VS:  BP 122/84   Pulse 84   Ht 5' 4"  (1.626 m)   Wt 142 lb 9.6 oz (64.7 kg)   LMP 04/20/2005   SpO2 94%   BMI 24.48 kg/m       General: Alert, oriented x3, no distress, appears lean and fit Head: no evidence of trauma, PERRL, EOMI, no exophtalmos or lid lag, no myxedema, no xanthelasma; normal ears, nose and oropharynx Neck: normal jugular venous pulsations and no hepatojugular reflux; brisk carotid pulses without delay and no carotid bruits Chest: clear to auscultation, no signs of consolidation by percussion or palpation, normal fremitus, symmetrical and full respiratory excursions Cardiovascular: normal position and quality of the apical impulse, regular rhythm, normal first and second heart sounds, no murmurs, rubs or gallops Abdomen: no tenderness or distention, no masses by palpation, no abnormal pulsatility or arterial bruits, normal bowel sounds, no hepatosplenomegaly Extremities: no clubbing, cyanosis or edema; 2+ radial, ulnar and brachial pulses bilaterally; 2+ right femoral, posterior tibial and dorsalis pedis pulses; 2+ left femoral,  posterior tibial and dorsalis pedis pulses; no subclavian or femoral bruits Neurological: grossly nonfocal Psych: Normal mood and affect   Wt Readings from Last 3 Encounters:  10/17/20 142 lb 9.6 oz (64.7 kg)  08/13/20 143 lb (64.9 kg)  08/08/19 148 lb (67.1 kg)     Studies/Labs Reviewed:   EKG:  EKG is ordered today.  It shows atrial fibrillation with controlled ventricular rate and is otherwise a normal tracing.  BMET    Component Value Date/Time   NA 139 07/21/2016 1022   K 4.5 07/21/2016 1022   CL 104 07/21/2016 1022   CO2 30 07/21/2016 1022   GLUCOSE 97 07/21/2016 1022   BUN 10 07/21/2016 1022  CREATININE 0.54 07/21/2016 1022   CALCIUM 9.5 07/21/2016 1022   GFRNONAA >60 03/23/2016 0315   GFRAA >60 03/23/2016 0315  10/20/2018 creatinine 0.77, normal liver function tests  Lipid Panel  No results found for: CHOL, TRIG, HDL, CHOLHDL, VLDL, LDLCALC, LDLDIRECT, LABVLDL  10/24/2019 Total cholesterol 161, HDL 51, triglycerides 93, calculated LDL 93  ASSESSMENT:    1. Persistent atrial fibrillation (HCC)      PLAN:  In order of problems listed above:  1.  Persistent atrial fibrillation: Symptomatic despite good rate control.  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  There has been no interruption in anticoagulation.  We will schedule for a DC cardioversion.  Important not to skip any doses of anticoagulant in the interim.  At this point, it appears premature to initiate antiarrhythmic therapy.  Might be a good candidate for ablation since she is free of significant structural heart disease and does not have a dilated left atrium.  We should update her echocardiogram before considering ablation. 2.  Celiac disease: Currently well compensated. 3.  Glaucoma: On beta-blocker eyedrops.   Risk Assessment/Calculations:    CHA2DS2-VASc Score = 3  This indicates a 3.2% annual risk of stroke. The  patient's score is based upon: CHF History: No HTN History: No Diabetes History: No Stroke History: No Vascular Disease History: No Age Score: 2 Gender Score: 1   Shared Decision Making/Informed Consent The risks (stroke, cardiac arrhythmias rarely resulting in the need for a temporary or permanent pacemaker, skin irritation or burns and complications associated with conscious sedation including aspiration, arrhythmia, respiratory failure and death), benefits (restoration of normal sinus rhythm) and alternatives of a direct current cardioversion were explained in detail to Angela Morales and she agrees to proceed.          Medication Adjustments/Labs and Tests Ordered: Current medicines are reviewed at length with the patient today.  Concerns regarding medicines are outlined above.  Orders Placed This Encounter  Procedures  . Basic metabolic panel  . CBC  . EKG 12-Lead   No orders of the defined types were placed in this encounter.   Patient Instructions  Medication Instructions:  No changes  *If you need a refill on your cardiac medications before your next appointment, please call your pharmacy*   Follow-Up: At [facility], you and your health needs are our priority.  As part of our continuing mission to provide you with exceptional heart care, we have created designated Provider Care Teams.  These Care Teams include your primary Cardiologist (physician) and Advanced Practice Providers (APPs -  Physician Assistants and Nurse Practitioners) who all work together to provide you with the care you need, when you need it.  We recommend signing up for the patient portal called "MyChart".  Sign up information is provided on this After Visit Summary.  MyChart is used to connect with patients for Virtual Visits (Telemedicine).  Patients are able to view lab/test results, encounter notes, upcoming appointments, etc.  Non-urgent messages can be sent to your provider as well.   To learn  more about what you can do with MyChart, go to [email or web address].    Your next appointment:   Follow up with a-fib clinic 2 weeks after cardioversion. Follow up in 3 months with Dr. Sallyanne Morales.   Other Instructions Dear Angela Morales are scheduled for a Cardioversion on May 5 with Dr. Marlou Porch.  Please arrive at the [facility] (Main Entrance  A) at [facility]: [address] [place], [place] [address] at 8:30am. (1 hour prior to procedure unless lab work is needed; if lab work is needed arrive 1.5 hours ahead)  DIET: Nothing to eat or drink after midnight except a sip of water with medications (see medication instructions below)  Medication Instructions: Nothing to hold.   Continue your anticoagulant: Eliquis. If you miss a dose, call the office immediately, as your cardioversion may have to be re-scheduled.   You will need to continue your anticoagulant after your procedure until you are told by your  Provider that it is safe to stop   Labs: On May 2, come to the [facility] or any [facility] for BMet, CBC.   On May 2, You will need to have the coronavirus test completed prior to your procedure. An appointment has been made at 9:25am on  Oct 27, 2020. This is a Drive Up Visit at the [address] [place]. Please tell them that you are there for pre-procedure testing. Someone will direct you to the appropriate testing line. Stay in your car and someone will be with you shortly. Please make sure to have all other labs completed before this test because you will need to stay quarantined until your procedure. Please take your insurance card to this test.     You must have a responsible person to drive you home and stay in the waiting area during your procedure. Failure to do so could result in cancellation.  Bring your insurance cards.  *Special Note: Every effort is made to have your procedure done on time. Occasionally there are emergencies that occur at the  hospital that may cause delays. Please be patient if a delay does occur.       Signed, Sanda Klein, MD  10/18/2020 12:33 PM    [place]

## 2020-10-17 NOTE — Progress Notes (Signed)
Cardiology Office Note    Date:  10/18/2020   ID:  Angela Morales, DOB January 28, 1946, MRN [id number]  PCP:  Deland Pretty, MD  Cardiologist:   Sanda Klein, MD   Chief Complaint  Patient presents with  . Atrial Fibrillation    History of Present Illness:  Angela Morales is a 75 y.o. female with what appears to be "lone" atrial fibrillation.  She is a retired Designer, jewellery.  For the first time this year she has developed persistent atrial fibrillation.  This is well rate controlled.  Nevertheless, she has some increase in fatigue and shortness of breath since the onset of atrial fibrillation.  She had a recent spinal injection, but did not stop her Eliquis for that procedure.  She has not had recent bleeding problems, falls/injuries, but does have a remote history of traumatic right knee hemarthrosis.  She is aware of palpitations and denies dizziness, syncope, edema, neurological complaints, chest pain at rest or with activity.  She does not have orthopnea or PND and develops shortness of breath only with moderate activity (NYHA functional class II.   Her first detected episode of atrial fibrillation was related to an acute diarrheal illness, eventually proven to the true celiac disease.  Since then her GI symptoms have improved substantially on a gluten-free diet.  Previous workup has demonstrated the absence of significant structural cardiac abnormalities. Both the right and left atrium were described as being normal in size by echocardiography. There are no valvular problems.  the rhythm is sinus with PACs.  Past Medical History:  Diagnosis Date  . Abnormal uterine bleeding    on HRT  . Allergy    seasonal  . Arthritis   . Atrial fibrillation ([place])   . Celiac disease   . DDD (degenerative disc disease)   . Dysrhythmia   . Glaucoma   . Microscopic colitis   . Osteoarthritis of both knees   . PONV (postoperative nausea and vomiting)   . SVT (supraventricular  tachycardia) (HCC)     Past Surgical History:  Procedure Laterality Date  . APPENDECTOMY    . COLONOSCOPY    . FOOT FRACTURE SURGERY  10/2013  . TONSILLECTOMY AND ADENOIDECTOMY    . TOTAL KNEE ARTHROPLASTY Left 03/22/2016   Procedure: TOTAL KNEE ARTHROPLASTY;  Surgeon: Dorna Leitz, MD;  Location: [place];  Service: Orthopedics;  Laterality: Left;  . TUBAL LIGATION  1981  . VAGINAL HYSTERECTOMY  04/20/05   prolapse, adenomyosis    Current Medications: Outpatient Medications Prior to Visit  Medication Sig Dispense Refill  . calcium gluconate 500 MG tablet Take 1 tablet by mouth as directed.     Marland Kitchen ELIQUIS 5 MG TABS tablet TAKE 1 TABLET BY MOUTH TWICE A DAY 180 tablet 1  . ibuprofen (ADVIL,MOTRIN) 200 MG tablet Take 200 mg by mouth every 6 (six) hours as needed.    . loratadine (CLARITIN) 10 MG tablet Take 10 mg by mouth as needed.     . metoprolol succinate (TOPROL-XL) 25 MG 24 hr tablet TAKE 1 TABLET BY MOUTH EVERY DAY 90 tablet 3  . psyllium (METAMUCIL) 58.6 % packet Take 1 packet by mouth daily.    . timolol (TIMOPTIC) 0.5 % ophthalmic solution Place 1 drop into both eyes 3 (three) times daily.    . XELPROS 0.005 % EMUL Apply 1 drop to eye at bedtime.     Facility-Administered Medications Prior to Visit  Medication Dose Route Frequency Provider Last Rate Last Admin  .  0.9 %  sodium chloride infusion  500 mL Intravenous Continuous [name], Kirke Corin, MD         Allergies:   Cefdinir   Social History   Socioeconomic History  . Marital status: Married    Spouse name: Not on file  . Number of children: 3  . Years of education: Not on file  . Highest education level: Not on file  Occupational History  . Occupation: retired  Tobacco Use  . Smoking status: Never Smoker  . Smokeless tobacco: Never Used  Substance and Sexual Activity  . Alcohol use: Yes    Alcohol/week: 4.0 - 6.0 standard drinks    Types: 4 - 6 Glasses of wine per week    Comment: occasional  . Drug use: No   . Sexual activity: Yes    Partners: Male    Birth control/protection: Surgical    Comment: TVH  Other Topics Concern  . Not on file  Social History Narrative  . Not on file   Social Determinants of Health   Financial Resource Strain: Not on file  Food Insecurity: Not on file  Transportation Needs: Not on file  Physical Activity: Not on file  Stress: Not on file  Social Connections: Not on file     Family History:  The patient's family history includes Bipolar disorder in her son; Breast cancer in her cousin; Heart disease (age of onset: 72) in her brother; Liver disease in her son; Osteoarthritis in her maternal grandmother; Osteoporosis in her mother; Prostate cancer in her father; Stroke in her mother.   ROS:   Please see the history of present illness.    ROS All other systems reviewed and are negative.   PHYSICAL EXAM:   VS:  BP 122/84   Pulse 84   Ht 5' 4"  (1.626 m)   Wt 142 lb 9.6 oz (64.7 kg)   LMP 04/20/2005   SpO2 94%   BMI 24.48 kg/m       General: Alert, oriented x3, no distress, appears lean and fit Head: no evidence of trauma, PERRL, EOMI, no exophtalmos or lid lag, no myxedema, no xanthelasma; normal ears, nose and oropharynx Neck: normal jugular venous pulsations and no hepatojugular reflux; brisk carotid pulses without delay and no carotid bruits Chest: clear to auscultation, no signs of consolidation by percussion or palpation, normal fremitus, symmetrical and full respiratory excursions Cardiovascular: normal position and quality of the apical impulse, regular rhythm, normal first and second heart sounds, no murmurs, rubs or gallops Abdomen: no tenderness or distention, no masses by palpation, no abnormal pulsatility or arterial bruits, normal bowel sounds, no hepatosplenomegaly Extremities: no clubbing, cyanosis or edema; 2+ radial, ulnar and brachial pulses bilaterally; 2+ right femoral, posterior tibial and dorsalis pedis pulses; 2+ left femoral,  posterior tibial and dorsalis pedis pulses; no subclavian or femoral bruits Neurological: grossly nonfocal Psych: Normal mood and affect   Wt Readings from Last 3 Encounters:  10/17/20 142 lb 9.6 oz (64.7 kg)  08/13/20 143 lb (64.9 kg)  08/08/19 148 lb (67.1 kg)     Studies/Labs Reviewed:   EKG:  EKG is ordered today.  It shows atrial fibrillation with controlled ventricular rate and is otherwise a normal tracing.  BMET    Component Value Date/Time   NA 139 07/21/2016 1022   K 4.5 07/21/2016 1022   CL 104 07/21/2016 1022   CO2 30 07/21/2016 1022   GLUCOSE 97 07/21/2016 1022   BUN 10 07/21/2016 1022  CREATININE 0.54 07/21/2016 1022   CALCIUM 9.5 07/21/2016 1022   GFRNONAA >60 03/23/2016 0315   GFRAA >60 03/23/2016 0315  10/20/2018 creatinine 0.77, normal liver function tests  Lipid Panel  No results found for: CHOL, TRIG, HDL, CHOLHDL, VLDL, LDLCALC, LDLDIRECT, LABVLDL  10/24/2019 Total cholesterol 161, HDL 51, triglycerides 93, calculated LDL 93  ASSESSMENT:    1. Persistent atrial fibrillation (HCC)      PLAN:  In order of problems listed above:  1.  Persistent atrial fibrillation: Symptomatic despite good rate control.  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  There has been no interruption in anticoagulation.  We will schedule for a DC cardioversion.  Important not to skip any doses of anticoagulant in the interim.  At this point, it appears premature to initiate antiarrhythmic therapy.  Might be a good candidate for ablation since she is free of significant structural heart disease and does not have a dilated left atrium.  We should update her echocardiogram before considering ablation. 2.  Celiac disease: Currently well compensated. 3.  Glaucoma: On beta-blocker eyedrops.   Risk Assessment/Calculations:    CHA2DS2-VASc Score = 3  This indicates a 3.2% annual risk of stroke. The  patient's score is based upon: CHF History: No HTN History: No Diabetes History: No Stroke History: No Vascular Disease History: No Age Score: 2 Gender Score: 1   Shared Decision Making/Informed Consent The risks (stroke, cardiac arrhythmias rarely resulting in the need for a temporary or permanent pacemaker, skin irritation or burns and complications associated with conscious sedation including aspiration, arrhythmia, respiratory failure and death), benefits (restoration of normal sinus rhythm) and alternatives of a direct current cardioversion were explained in detail to Ms. Kimball and she agrees to proceed.          Medication Adjustments/Labs and Tests Ordered: Current medicines are reviewed at length with the patient today.  Concerns regarding medicines are outlined above.  Orders Placed This Encounter  Procedures  . Basic metabolic panel  . CBC  . EKG 12-Lead   No orders of the defined types were placed in this encounter.   Patient Instructions  Medication Instructions:  No changes  *If you need a refill on your cardiac medications before your next appointment, please call your pharmacy*   Follow-Up: At [facility], you and your health needs are our priority.  As part of our continuing mission to provide you with exceptional heart care, we have created designated Provider Care Teams.  These Care Teams include your primary Cardiologist (physician) and Advanced Practice Providers (APPs -  Physician Assistants and Nurse Practitioners) who all work together to provide you with the care you need, when you need it.  We recommend signing up for the patient portal called "MyChart".  Sign up information is provided on this After Visit Summary.  MyChart is used to connect with patients for Virtual Visits (Telemedicine).  Patients are able to view lab/test results, encounter notes, upcoming appointments, etc.  Non-urgent messages can be sent to your provider as well.   To learn  more about what you can do with MyChart, go to [email or web address].    Your next appointment:   Follow up with a-fib clinic 2 weeks after cardioversion. Follow up in 3 months with Dr. Sallyanne Kuster.   Other Instructions Dear Ms. Ruthy Forry are scheduled for a Cardioversion on May 5 with Dr. Marlou Porch.  Please arrive at the [facility] (Main Entrance  A) at [facility]: [address] [place], [place] [address] at 8:30am. (1 hour prior to procedure unless lab work is needed; if lab work is needed arrive 1.5 hours ahead)  DIET: Nothing to eat or drink after midnight except a sip of water with medications (see medication instructions below)  Medication Instructions: Nothing to hold.   Continue your anticoagulant: Eliquis. If you miss a dose, call the office immediately, as your cardioversion may have to be re-scheduled.   You will need to continue your anticoagulant after your procedure until you are told by your  Provider that it is safe to stop   Labs: On May 2, come to the [facility] or any [facility] for BMet, CBC.   On May 2, You will need to have the coronavirus test completed prior to your procedure. An appointment has been made at 9:25am on  Oct 27, 2020. This is a Drive Up Visit at the [address] [place]. Please tell them that you are there for pre-procedure testing. Someone will direct you to the appropriate testing line. Stay in your car and someone will be with you shortly. Please make sure to have all other labs completed before this test because you will need to stay quarantined until your procedure. Please take your insurance card to this test.     You must have a responsible person to drive you home and stay in the waiting area during your procedure. Failure to do so could result in cancellation.  Bring your insurance cards.  *Special Note: Every effort is made to have your procedure done on time. Occasionally there are emergencies that occur at the  hospital that may cause delays. Please be patient if a delay does occur.       Signed, Sanda Klein, MD  10/18/2020 12:33 PM    [place]

## 2020-10-18 ENCOUNTER — Encounter: Payer: Self-pay | Admitting: Cardiovascular Disease

## 2020-10-24 ENCOUNTER — Ambulatory Visit: Payer: Medicare Other | Admitting: Cardiovascular Disease

## 2020-10-27 ENCOUNTER — Other Ambulatory Visit (HOSPITAL_COMMUNITY)
Admission: RE | Admit: 2020-10-27 | Discharge: 2020-10-27 | Disposition: A | Discharge status: Home / Self Care | Payer: Medicare Other | Source: Ambulatory Visit | Attending: Cardiology | Admitting: Cardiology

## 2020-10-27 DIAGNOSIS — Z01812 Encounter for preprocedural laboratory examination: Secondary | ICD-10-CM | POA: Diagnosis not present

## 2020-10-27 DIAGNOSIS — Z20822 Contact with and (suspected) exposure to covid-19: Secondary | ICD-10-CM | POA: Diagnosis not present

## 2020-10-27 DIAGNOSIS — I480 Paroxysmal atrial fibrillation: Secondary | ICD-10-CM | POA: Diagnosis not present

## 2020-10-28 LAB — CBC
Hematocrit: 44.4 % (ref 34.0–46.6)
Hemoglobin: 16.6 g/dL — ABNORMAL HIGH (ref 11.1–15.9)
MCH: 38.4 pg — ABNORMAL HIGH (ref 26.6–33.0)
MCHC: 37.4 g/dL — ABNORMAL HIGH (ref 31.5–35.7)
MCV: 103 fL — ABNORMAL HIGH (ref 79–97)
Platelets: 217 10*3/uL (ref 150–450)
RBC: 4.32 x10E6/uL (ref 3.77–5.28)
RDW: 12.6 % (ref 11.7–15.4)
WBC: 5.8 10*3/uL (ref 3.4–10.8)

## 2020-10-28 LAB — BASIC METABOLIC PANEL
BUN/Creatinine Ratio: 14 (ref 12–28)
BUN: 9 mg/dL (ref 8–27)
CO2: 28 mmol/L (ref 20–29)
Calcium: 9.6 mg/dL (ref 8.7–10.3)
Chloride: 101 mmol/L (ref 96–106)
Creatinine, Ser: 0.66 mg/dL (ref 0.57–1.00)
Glucose: 74 mg/dL (ref 65–99)
Potassium: 4.4 mmol/L (ref 3.5–5.2)
Sodium: 140 mmol/L (ref 134–144)
eGFR: 91 mL/min/{1.73_m2} (ref >59)

## 2020-10-28 LAB — SARS CORONAVIRUS 2 (TAT 6-24 HRS): SARS Coronavirus 2: NEGATIVE

## 2020-10-30 ENCOUNTER — Ambulatory Visit (HOSPITAL_COMMUNITY): Payer: Medicare Other | Admitting: Anesthesiology

## 2020-10-30 ENCOUNTER — Encounter (HOSPITAL_COMMUNITY): Payer: Self-pay | Admitting: Cardiology

## 2020-10-30 ENCOUNTER — Encounter (HOSPITAL_COMMUNITY)
Admission: RE | Disposition: A | Discharge status: Home / Self Care | Payer: Self-pay | Source: Ambulatory Visit | Attending: Cardiology

## 2020-10-30 ENCOUNTER — Other Ambulatory Visit: Payer: Self-pay | Admitting: *Deleted

## 2020-10-30 ENCOUNTER — Ambulatory Visit (HOSPITAL_COMMUNITY)
Admission: RE | Admit: 2020-10-30 | Discharge: 2020-10-30 | Disposition: A | Discharge status: Home / Self Care | Payer: Medicare Other | Source: Ambulatory Visit | Attending: Cardiology | Admitting: Cardiology

## 2020-10-30 DIAGNOSIS — I4819 Other persistent atrial fibrillation: Secondary | ICD-10-CM | POA: Insufficient documentation

## 2020-10-30 DIAGNOSIS — Z881 Allergy status to other antibiotic agents status: Secondary | ICD-10-CM | POA: Insufficient documentation

## 2020-10-30 DIAGNOSIS — I480 Paroxysmal atrial fibrillation: Secondary | ICD-10-CM

## 2020-10-30 DIAGNOSIS — Z79899 Other long term (current) drug therapy: Secondary | ICD-10-CM | POA: Insufficient documentation

## 2020-10-30 DIAGNOSIS — H409 Unspecified glaucoma: Secondary | ICD-10-CM | POA: Insufficient documentation

## 2020-10-30 DIAGNOSIS — Z8249 Family history of ischemic heart disease and other diseases of the circulatory system: Secondary | ICD-10-CM | POA: Diagnosis not present

## 2020-10-30 DIAGNOSIS — I4891 Unspecified atrial fibrillation: Secondary | ICD-10-CM | POA: Diagnosis not present

## 2020-10-30 DIAGNOSIS — I959 Hypotension, unspecified: Secondary | ICD-10-CM | POA: Diagnosis not present

## 2020-10-30 DIAGNOSIS — Z7901 Long term (current) use of anticoagulants: Secondary | ICD-10-CM | POA: Insufficient documentation

## 2020-10-30 DIAGNOSIS — Z96652 Presence of left artificial knee joint: Secondary | ICD-10-CM | POA: Insufficient documentation

## 2020-10-30 DIAGNOSIS — I4892 Unspecified atrial flutter: Secondary | ICD-10-CM | POA: Diagnosis not present

## 2020-10-30 DIAGNOSIS — Z90710 Acquired absence of both cervix and uterus: Secondary | ICD-10-CM | POA: Insufficient documentation

## 2020-10-30 HISTORY — PX: CARDIOVERSION: SHX1299

## 2020-10-30 SURGERY — CARDIOVERSION
Anesthesia: General

## 2020-10-30 MED ORDER — PROPOFOL 10 MG/ML IV BOLUS
INTRAVENOUS | Status: DC | PRN
  Administered 2020-10-30: 60 mg via INTRAVENOUS

## 2020-10-30 MED ORDER — SODIUM CHLORIDE 0.9 % IV SOLN: INTRAVENOUS | Status: DC | PRN

## 2020-10-30 MED ORDER — LIDOCAINE 2% (20 MG/ML) 5 ML SYRINGE
INTRAMUSCULAR | Status: DC | PRN
  Administered 2020-10-30: 40 mg via INTRAVENOUS

## 2020-10-30 NOTE — Interval H&P Note (Signed)
History and Physical Interval Note:  10/30/2020 9:17 AM  Angela Morales  has presented today for surgery, with the diagnosis of AFIB.  The various methods of treatment have been discussed with the patient and family. After consideration of risks, benefits and other options for treatment, the patient has consented to  Procedure(s): CARDIOVERSION (N/A) as a surgical intervention.  The patient's history has been reviewed, patient examined, no change in status, stable for surgery.  I have reviewed the patient's chart and labs.  Questions were answered to the patient's satisfaction.     [employer]

## 2020-10-30 NOTE — CV Procedure (Signed)
    Electrical Cardioversion Procedure Note Angela Morales [id number] [birth year]  Procedure: Electrical Cardioversion Indications:  Atrial Fibrillation  Time Out: Verified patient identification, verified procedure,medications/allergies/relevent history reviewed, required imaging and test results available.  Performed  Procedure Details  The patient was NPO after midnight. Anesthesia was administered at the beside  by Dr.Green with propofol.  Cardioversion was performed with synchronized biphasic defibrillation via AP pads with 120, 200, 200 with compression joules.  3 attempt(s) were performed.  The patient paused but remained in atrial fibrillation. The patient tolerated the procedure well   IMPRESSION:  Unsuccessful cardioversion of atrial fibrillation despite compression and 3 attempts.   AFIB under good rate control  [name] 10/30/2020, 9:29 AM

## 2020-10-30 NOTE — Anesthesia Procedure Notes (Signed)
Procedure Name: General with mask airway Date/Time: 10/30/2020 9:08 AM Performed by: Rande Brunt, CRNA Pre-anesthesia Checklist: Patient identified, Emergency Drugs available, Suction available and Patient being monitored Patient Re-evaluated:Patient Re-evaluated prior to induction Oxygen Delivery Method: Ambu bag Preoxygenation: Pre-oxygenation with 100% oxygen Induction Type: IV induction Placement Confirmation: positive ETCO2 and CO2 detector Dental Injury: Teeth and Oropharynx as per pre-operative assessment

## 2020-10-30 NOTE — Discharge Instructions (Signed)
Electrical Cardioversion Electrical cardioversion is the delivery of a jolt of electricity to restore a normal rhythm to the heart. A rhythm that is too fast or is not regular keeps the heart from pumping well. In this procedure, sticky patches or metal paddles are placed on the chest to deliver electricity to the heart from a device. This procedure may be done in an emergency if:  There is low or no blood pressure as a result of the heart rhythm.  Normal rhythm must be restored as fast as possible to protect the brain and heart from further damage.  It may save a life. This may also be a scheduled procedure for irregular or fast heart rhythms that are not immediately life-threatening. Tell a health care provider about:  Any allergies you have.  All medicines you are taking, including vitamins, herbs, eye drops, creams, and over-the-counter medicines.  Any problems you or family members have had with anesthetic medicines.  Any blood disorders you have.  Any surgeries you have had.  Any medical conditions you have.  Whether you are pregnant or may be pregnant. What are the risks? Generally, this is a safe procedure. However, problems may occur, including:  Allergic reactions to medicines.  A blood clot that breaks free and travels to other parts of your body.  The possible return of an abnormal heart rhythm within hours or days after the procedure.  Your heart stopping (cardiac arrest). This is rare. What happens before the procedure? Medicines  Your health care provider may have you start taking: ? Blood-thinning medicines (anticoagulants) so your blood does not clot as easily. ? Medicines to help stabilize your heart rate and rhythm.  Ask your health care provider about: ? Changing or stopping your regular medicines. This is especially important if you are taking diabetes medicines or blood thinners. ? Taking medicines such as aspirin and ibuprofen. These medicines can  thin your blood. Do not take these medicines unless your health care provider tells you to take them. ? Taking over-the-counter medicines, vitamins, herbs, and supplements. General instructions  Follow instructions from your health care provider about eating or drinking restrictions.  Plan to have someone take you home from the hospital or clinic.  If you will be going home right after the procedure, plan to have someone with you for 24 hours.  Ask your health care provider what steps will be taken to help prevent infection. These may include washing your skin with a germ-killing soap. What happens during the procedure?  An IV will be inserted into one of your veins.  Sticky patches (electrodes) or metal paddles may be placed on your chest.  You will be given a medicine to help you relax (sedative).  An electrical shock will be delivered. The procedure may vary among health care providers and hospitals.   What can I expect after the procedure?  Your blood pressure, heart rate, breathing rate, and blood oxygen level will be monitored until you leave the hospital or clinic.  Your heart rhythm will be watched to make sure it does not change.  You may have some redness on the skin where the shocks were given. Follow these instructions at home:  Do not drive for 24 hours if you were given a sedative during your procedure.  Take over-the-counter and prescription medicines only as told by your health care provider.  Ask your health care provider how to check your pulse. Check it often.  Rest for 48 hours after the procedure   or as told by your health care provider.  Avoid or limit your caffeine use as told by your health care provider.  Keep all follow-up visits as told by your health care provider. This is important. Contact a health care provider if:  You feel like your heart is beating too quickly or your pulse is not regular.  You have a serious muscle cramp that does not go  away. Get help right away if:  You have discomfort in your chest.  You are dizzy or you feel faint.  You have trouble breathing or you are short of breath.  Your speech is slurred.  You have trouble moving an arm or leg on one side of your body.  Your fingers or toes turn cold or blue. Summary  Electrical cardioversion is the delivery of a jolt of electricity to restore a normal rhythm to the heart.  This procedure may be done right away in an emergency or may be a scheduled procedure if the condition is not an emergency.  Generally, this is a safe procedure.  After the procedure, check your pulse often as told by your health care provider. This information is not intended to replace advice given to you by your health care provider. Make sure you discuss any questions you have with your health care provider. Document Revised: 01/15/2019 Document Reviewed: 01/15/2019 Elsevier Patient Education  2021 [employer]  

## 2020-10-30 NOTE — Anesthesia Postprocedure Evaluation (Signed)
Anesthesia Post Note  Patient: Angela Morales  Procedure(s) Performed: CARDIOVERSION (N/A )     Anesthesia Post Evaluation No complications documented.  Last Vitals:  Vitals:   10/30/20 0940 10/30/20 0950  BP: 108/68 109/62  Pulse: 76 86  Resp: 12 18  Temp:    SpO2: 97% 99%    Last Pain:  Vitals:   10/30/20 0950  TempSrc:   PainSc: 0-No pain                 Jersey Ravenscroft

## 2020-10-30 NOTE — Transfer of Care (Signed)
Immediate Anesthesia Transfer of Care Note  Patient: Angela Morales  Procedure(s) Performed: CARDIOVERSION (N/A )  Patient Location: Endoscopy Unit  Anesthesia Type:General  Level of Consciousness: drowsy, patient cooperative and responds to stimulation  Airway & Oxygen Therapy: Patient Spontanous Breathing  Post-op Assessment: Report given to RN, Post -op Vital signs reviewed and stable and Patient moving all extremities  Post vital signs: Reviewed and stable  Last Vitals:  Vitals Value Taken Time  BP 105/58   Temp    Pulse 82   Resp 12   SpO2 96     Last Pain:  Vitals:   10/30/20 0850  TempSrc: Temporal  PainSc: 0-No pain         Complications: No complications documented.

## 2020-10-30 NOTE — Anesthesia Preprocedure Evaluation (Addendum)
Anesthesia Evaluation  Patient identified by MRN, date of birth, ID band Patient awake    History of Anesthesia Complications (+) PONV  Airway Mallampati: II  TM Distance: >3 FB     Dental   Pulmonary neg pulmonary ROS,    breath sounds clear to auscultation       Cardiovascular + dysrhythmias Atrial Fibrillation  Rhythm:Regular Rate:Normal     Neuro/Psych    GI/Hepatic negative GI ROS, Neg liver ROS,   Endo/Other  negative endocrine ROS  Renal/GU negative Renal ROS     Musculoskeletal  (+) Arthritis ,   Abdominal   Peds  Hematology   Anesthesia Other Findings   Reproductive/Obstetrics                            Anesthesia Physical Anesthesia Plan  ASA: III  Anesthesia Plan: General   Post-op Pain Management:    Induction:   PONV Risk Score and Plan: Propofol infusion  Airway Management Planned: Nasal Cannula and Simple Face Mask  Additional Equipment:   Intra-op Plan:   Post-operative Plan:   Informed Consent: I have reviewed the patients History and Physical, chart, labs and discussed the procedure including the risks, benefits and alternatives for the proposed anesthesia with the patient or authorized representative who has indicated his/her understanding and acceptance.     Dental advisory given  Plan Discussed with: CRNA and Anesthesiologist  Anesthesia Plan Comments:         Anesthesia Quick Evaluation

## 2020-10-31 ENCOUNTER — Encounter (HOSPITAL_COMMUNITY): Payer: Self-pay | Admitting: Cardiology

## 2020-11-04 DIAGNOSIS — M810 Age-related osteoporosis without current pathological fracture: Secondary | ICD-10-CM | POA: Diagnosis not present

## 2020-11-04 DIAGNOSIS — E785 Hyperlipidemia, unspecified: Secondary | ICD-10-CM | POA: Diagnosis not present

## 2020-11-04 DIAGNOSIS — K52832 Lymphocytic colitis: Secondary | ICD-10-CM | POA: Diagnosis not present

## 2020-11-04 DIAGNOSIS — J309 Allergic rhinitis, unspecified: Secondary | ICD-10-CM | POA: Diagnosis not present

## 2020-11-04 DIAGNOSIS — I480 Paroxysmal atrial fibrillation: Secondary | ICD-10-CM | POA: Diagnosis not present

## 2020-11-04 DIAGNOSIS — Z0000 Encounter for general adult medical examination without abnormal findings: Secondary | ICD-10-CM | POA: Diagnosis not present

## 2020-11-04 DIAGNOSIS — K900 Celiac disease: Secondary | ICD-10-CM | POA: Diagnosis not present

## 2020-11-04 DIAGNOSIS — D6869 Other thrombophilia: Secondary | ICD-10-CM | POA: Diagnosis not present

## 2020-11-10 DIAGNOSIS — H1045 Other chronic allergic conjunctivitis: Secondary | ICD-10-CM | POA: Diagnosis not present

## 2020-11-13 ENCOUNTER — Other Ambulatory Visit: Payer: Self-pay

## 2020-11-13 ENCOUNTER — Ambulatory Visit (HOSPITAL_COMMUNITY)
Admission: RE | Admit: 2020-11-13 | Discharge: 2020-11-13 | Disposition: A | Discharge status: Home / Self Care | Payer: Medicare Other | Source: Ambulatory Visit | Attending: Physician Assistant | Admitting: Physician Assistant

## 2020-11-13 VITALS — BP 106/74 | HR 100 | Ht 64.0 in | Wt 142.0 lb

## 2020-11-13 DIAGNOSIS — Z8249 Family history of ischemic heart disease and other diseases of the circulatory system: Secondary | ICD-10-CM | POA: Insufficient documentation

## 2020-11-13 DIAGNOSIS — D6869 Other thrombophilia: Secondary | ICD-10-CM | POA: Diagnosis not present

## 2020-11-13 DIAGNOSIS — I4892 Unspecified atrial flutter: Secondary | ICD-10-CM | POA: Diagnosis not present

## 2020-11-13 DIAGNOSIS — K900 Celiac disease: Secondary | ICD-10-CM | POA: Insufficient documentation

## 2020-11-13 DIAGNOSIS — I4819 Other persistent atrial fibrillation: Secondary | ICD-10-CM | POA: Diagnosis not present

## 2020-11-13 DIAGNOSIS — Z7901 Long term (current) use of anticoagulants: Secondary | ICD-10-CM | POA: Diagnosis not present

## 2020-11-13 DIAGNOSIS — Z79899 Other long term (current) drug therapy: Secondary | ICD-10-CM | POA: Insufficient documentation

## 2020-11-13 DIAGNOSIS — I483 Typical atrial flutter: Secondary | ICD-10-CM | POA: Diagnosis not present

## 2020-11-13 NOTE — Progress Notes (Signed)
Thanks, [employer]

## 2020-11-13 NOTE — Progress Notes (Signed)
Primary Care Physician: Deland Pretty, MD Primary Cardiologist: Dr Sallyanne Kuster  Primary Electrophysiologist: none Referring Physician: Dr Verdia Kuba is a 75 y.o. female with a history of celiac disease, glaucoma, and atrial fibrillation who presents for consultation in the [facility].  The patient was initially diagnosed with atrial fibrillation in 2018 in the setting of an acute GI illness which was later diagnosed as Celiac disease. Patient is on Eliquis for a CHADS2VASC score of 3. She developed persistent afib and underwent DCCV on 10/30/20 which was unsuccessful. She does have symptoms of fatigue, palpitations, and dyspnea.   Today, she denies symptoms of chest pain, orthopnea, PND, lower extremity edema, dizziness, presyncope, syncope, snoring, daytime somnolence, bleeding, or neurologic sequela. The patient is tolerating medications without difficulties and is otherwise without complaint today.    Atrial Fibrillation Risk Factors:  she does not have symptoms or diagnosis of sleep apnea. she does not have a history of rheumatic fever.   she has a BMI of Body mass index is 24.37 kg/m.Marland Kitchen Filed Weights   11/13/20 1416  Weight: 64.4 kg    Family History  Problem Relation Age of Onset  . Osteoporosis Mother   . Stroke Mother        mini  . Prostate cancer Father   . Heart disease Brother 60       shunt put in heart  . Osteoarthritis Maternal Grandmother   . Bipolar disorder Son   . Liver disease Son   . Breast cancer Cousin        1st cousin  . Colon cancer Neg Hx      Atrial Fibrillation Management history:  Previous antiarrhythmic drugs: none Previous cardioversions: 10/30/20 Previous ablations: none CHADS2VASC score: 3 Anticoagulation history: Elilquis   Past Medical History:  Diagnosis Date  . Abnormal uterine bleeding    on HRT  . Allergy    seasonal  . Arthritis   . Atrial fibrillation ([place])   . Celiac  disease   . DDD (degenerative disc disease)   . Dysrhythmia   . Glaucoma   . Microscopic colitis   . Osteoarthritis of both knees   . PONV (postoperative nausea and vomiting)   . SVT (supraventricular tachycardia) ([facility])    Past Surgical History:  Procedure Laterality Date  . APPENDECTOMY    . CARDIOVERSION N/A 10/30/2020   Procedure: CARDIOVERSION;  Surgeon: Jerline Pain, MD;  Location: [facility] ENDOSCOPY;  Service: Cardiovascular;  Laterality: N/A;  . COLONOSCOPY    . FOOT FRACTURE SURGERY  10/2013  . TONSILLECTOMY AND ADENOIDECTOMY    . TOTAL KNEE ARTHROPLASTY Left 03/22/2016   Procedure: TOTAL KNEE ARTHROPLASTY;  Surgeon: Dorna Leitz, MD;  Location: [place];  Service: Orthopedics;  Laterality: Left;  . TUBAL LIGATION  1981  . VAGINAL HYSTERECTOMY  04/20/05   prolapse, adenomyosis    Current Outpatient Medications  Medication Sig Dispense Refill  . ELIQUIS 5 MG TABS tablet TAKE 1 TABLET BY MOUTH TWICE A DAY (Patient taking differently: Take 5 mg by mouth 2 (two) times daily.) 180 tablet 1  . ibuprofen (ADVIL,MOTRIN) 200 MG tablet Take 400 mg by mouth daily as needed (arthritis pain.).    Marland Kitchen ketorolac (ACULAR) 0.5 % ophthalmic solution 1 drop 4 (four) times daily.    Marland Kitchen loratadine (CLARITIN) 10 MG tablet Take 10 mg by mouth daily as needed for allergies.    . metoprolol succinate (TOPROL-XL) 25 MG 24 hr tablet  TAKE 1 TABLET BY MOUTH EVERY DAY (Patient taking differently: Take 25 mg by mouth at bedtime.) 90 tablet 3  . Polyethyl Glycol-Propyl Glycol (SYSTANE) 0.4-0.3 % SOLN Place 1-2 drops into both eyes 3 (three) times daily as needed (dry/irritated eyes).    . psyllium (METAMUCIL) 58.6 % packet Take 1 packet by mouth in the morning.    . timolol (TIMOPTIC) 0.5 % ophthalmic solution Place 1 drop into the left eye 2 (two) times daily.    . XELPROS 0.005 % EMUL Place 1 drop into the left eye at bedtime.     Current Facility-Administered Medications  Medication Dose Route Frequency Provider  Last Rate Last Admin  . 0.9 %  sodium chloride infusion  500 mL Intravenous Continuous Doran Stabler, MD        Allergies  Allergen Reactions  . Gluten Meal Other (See Comments)    celiac disease  . Cefdinir Rash    Social History   Socioeconomic History  . Marital status: Married    Spouse name: Not on file  . Number of children: 3  . Years of education: Not on file  . Highest education level: Not on file  Occupational History  . Occupation: retired  Tobacco Use  . Smoking status: Never Smoker  . Smokeless tobacco: Never Used  Substance and Sexual Activity  . Alcohol use: Yes    Alcohol/week: 4.0 - 6.0 standard drinks    Types: 4 - 6 Glasses of wine per week    Comment: occasional  . Drug use: No  . Sexual activity: Yes    Partners: Male    Birth control/protection: Surgical    Comment: TV[facility]  Other Topics Concern  . Not on file  Social History Narrative  . Not on file   Social Determinants of Health   Financial Resource Strain: Not on file  Food Insecurity: Not on file  Transportation Needs: Not on file  Physical Activity: Not on file  Stress: Not on file  Social Connections: Not on file  Intimate Partner Violence: Not on file     ROS- All systems are reviewed and negative except as per the HPI above.  Physical Exam: Vitals:   11/13/20 1416  BP: 106/74  Pulse: 100  Weight: 64.4 kg  Height: 5' 4"  (1.626 m)    GEN- The patient is a well appearing elderly female, alert and oriented x 3 today.   Head- normocephalic, atraumatic Eyes-  Sclera clear, conjunctiva pink Ears- hearing intact Oropharynx- clear Neck- supple  Lungs- Clear to ausculation bilaterally, normal work of breathing Heart- irregular rate and rhythm, no murmurs, rubs or gallops  GI- soft, NT, ND, + BS Extremities- no clubbing, cyanosis, or edema MS- no significant deformity or atrophy Skin- no rash or lesion Psych- euthymic mood, full affect Neuro- strength and sensation are  intact  Wt Readings from Last 3 Encounters:  11/13/20 64.4 kg  10/30/20 64.4 kg  10/17/20 64.7 kg    EKG today demonstrates  Typical atrial flutter with 3:1 block Vent. rate 100 BPM PR interval * ms QRS duration 70 ms QT/QTcB 348/448 ms  Echo 12/01/13 demonstrated  - Left ventricle: The cavity size was normal. Wall thickness was  normal. Systolic function was normal. The estimated ejection  fraction was in the range of 55% to 60%. Wall motion was normal;  there were no regional wall motion abnormalities. Left  ventricular diastolic function parameters were normal.  - Mitral valve: Mildly thickened leaflets .  There was mild  regurgitation.  - Left atrium: The atrium was normal in size.  - Right atrium: The atrium was normal in size.  - Atrial septum: No defect or patent foramen ovale was identified.   Impressions:   - LVEF 55-60%, normal wall thickness, normal biatrial sizes,  thickened mitral leaflets with mild MR. Normal diastolic  function.    Epic records are reviewed at length today  CHA2DS2-VASc Score = 3  The patient's score is based upon: CHF History: No HTN History: No Diabetes History: No Stroke History: No Vascular Disease History: No Age Score: 2 Gender Score: 1      ASSESSMENT AND PLAN: 1. Persistent Atrial Fibrillation/atrial flutter The patient's CHA2DS2-VASc score is 3, indicating a 3.2% annual risk of stroke.   S/p unsuccessful DCCV 10/30/20 We discussed therapeutic options including AAD vs ablation. She is agreeable to referral to EP. Will order echocardiogram.   Continue Eliquis 5 mg BID Continue Toprol 25 mg daily  2. Secondary Hypercoagulable State (ICD10:  D68.69) The patient is at significant risk for stroke/thromboembolism based upon her CHA2DS2-VASc Score of 3.  Continue Apixaban (Eliquis).    Follow up with EP for ablation consideration.    [facility] [address] [place], [place] [address] [phone] 11/13/2020 4:11 PM

## 2020-12-04 ENCOUNTER — Ambulatory Visit (HOSPITAL_COMMUNITY)
Admission: RE | Admit: 2020-12-04 | Discharge: 2020-12-04 | Disposition: A | Discharge status: Home / Self Care | Payer: Medicare Other | Source: Ambulatory Visit | Attending: Physician Assistant | Admitting: Physician Assistant

## 2020-12-04 ENCOUNTER — Other Ambulatory Visit: Payer: Self-pay

## 2020-12-04 DIAGNOSIS — I081 Rheumatic disorders of both mitral and tricuspid valves: Secondary | ICD-10-CM | POA: Diagnosis not present

## 2020-12-04 DIAGNOSIS — I4819 Other persistent atrial fibrillation: Secondary | ICD-10-CM

## 2020-12-04 NOTE — Progress Notes (Signed)
  Echocardiogram 2D Echocardiogram has been performed.  Angela Morales 12/04/2020, 10:26 AM

## 2020-12-05 LAB — ECHOCARDIOGRAM COMPLETE
Area-P 1/2: 4.31 cm2
S' Lateral: 3.2 cm

## 2020-12-08 ENCOUNTER — Ambulatory Visit: Payer: Medicare Other | Admitting: Cardiology

## 2020-12-08 ENCOUNTER — Encounter: Payer: Self-pay | Admitting: Cardiology

## 2020-12-08 ENCOUNTER — Other Ambulatory Visit: Payer: Self-pay

## 2020-12-08 VITALS — BP 134/76 | HR 64 | Ht 63.0 in | Wt 140.0 lb

## 2020-12-08 DIAGNOSIS — I4819 Other persistent atrial fibrillation: Secondary | ICD-10-CM | POA: Diagnosis not present

## 2020-12-08 NOTE — Progress Notes (Signed)
Electrophysiology Office Note   Date:  12/08/2020   ID:  Angela Morales, DOB [birth year], MRN [id number]  PCP:  Deland Pretty, MD  Cardiologist:  [facility] Primary Electrophysiologist:  Prabhjot Piscitello Meredith Leeds, MD    Chief Complaint: AF   History of Present Illness: Angela Morales is a 75 y.o. female who is being seen today for the evaluation of AF at the request of Fenton, Clint R, PA. Presenting today for electrophysiology evaluation.  She has a history significant for celiac disease, glaucoma, and atrial fibrillation.  She was diagnosed with atrial relation in 2018 in the setting of a GI illness that was later diagnosed with celiac disease.  She developed persistent atrial fibrillation and underwent cardioversion following 522 which was unfortunately unsuccessful.  Today, she denies symptoms of palpitations, chest pain, shortness of breath, orthopnea, PND, lower extremity edema, claudication, dizziness, presyncope, syncope, bleeding, or neurologic sequela. The patient is tolerating medications without difficulties.  When she is in atrial fibrillation she feels weak, fatigued, short of breath, and nauseous.  She did have a cardioversion that was unsuccessful.  Surprisingly she presents to cardiology clinic today in sinus rhythm.  She feels well and is without complaint.   Past Medical History:  Diagnosis Date   Abnormal uterine bleeding    on HRT   Allergy    seasonal   Arthritis    Atrial fibrillation (HCC)    Celiac disease    DDD (degenerative disc disease)    Dysrhythmia    Glaucoma    Microscopic colitis    Osteoarthritis of both knees    PONV (postoperative nausea and vomiting)    SVT (supraventricular tachycardia) ([place])    Past Surgical History:  Procedure Laterality Date   APPENDECTOMY     CARDIOVERSION N/A 10/30/2020   Procedure: CARDIOVERSION;  Surgeon: Jerline Pain, MD;  Location: [facility];  Service: Cardiovascular;  Laterality: N/A;   COLONOSCOPY      FOOT FRACTURE SURGERY  10/2013   TONSILLECTOMY AND ADENOIDECTOMY     TOTAL KNEE ARTHROPLASTY Left 03/22/2016   Procedure: TOTAL KNEE ARTHROPLASTY;  Surgeon: Dorna Leitz, MD;  Location: [facility];  Service: Orthopedics;  Laterality: Left;   TUBAL LIGATION  1981   VAGINAL HYSTERECTOMY  04/20/05   prolapse, adenomyosis     Current Outpatient Medications  Medication Sig Dispense Refill   ELIQUIS 5 MG TABS tablet TAKE 1 TABLET BY MOUTH TWICE A DAY (Patient taking differently: Take 5 mg by mouth 2 (two) times daily.) 180 tablet 1   ibuprofen (ADVIL,MOTRIN) 200 MG tablet Take 400 mg by mouth daily as needed (arthritis pain.).     ketorolac (ACULAR) 0.5 % ophthalmic solution 1 drop as needed.     loratadine (CLARITIN) 10 MG tablet Take 10 mg by mouth daily as needed for allergies.     metoprolol succinate (TOPROL-XL) 25 MG 24 hr tablet TAKE 1 TABLET BY MOUTH EVERY DAY (Patient taking differently: Take 25 mg by mouth at bedtime.) 90 tablet 3   Polyethyl Glycol-Propyl Glycol (SYSTANE) 0.4-0.3 % SOLN Place 1-2 drops into both eyes 3 (three) times daily as needed (dry/irritated eyes).     psyllium (METAMUCIL) 58.6 % packet Take 1 packet by mouth in the morning.     timolol (TIMOPTIC) 0.5 % ophthalmic solution Place 1 drop into the left eye 2 (two) times daily.     XELPROS 0.005 % EMUL Place 1 drop into the left eye at bedtime.     Current Facility-Administered  Medications  Medication Dose Route Frequency Provider Last Rate Last Admin   0.9 %  sodium chloride infusion  500 mL Intravenous Continuous [name], Estill Cotta III, MD        Allergies:   Gluten meal and Cefdinir   Social History:  The patient  reports that she has never smoked. She has never used smokeless tobacco. She reports current alcohol use of about 4.0 - 6.0 standard drinks of alcohol per week. She reports that she does not use drugs.   Family History:  The patient's family history includes Bipolar disorder in her son; Breast cancer in her  cousin; Heart disease (age of onset: 38) in her brother; Liver disease in her son; Osteoarthritis in her maternal grandmother; Osteoporosis in her mother; Prostate cancer in her father; Stroke in her mother.    ROS:  Please see the history of present illness.   Otherwise, review of systems is positive for none.   All other systems are reviewed and negative.    PHYSICAL EXAM: VS:  BP 134/76   Pulse 64   Ht 5' 3"  (1.6 m)   Wt 140 lb (63.5 kg)   LMP 04/20/2005   BMI 24.80 kg/m  , BMI Body mass index is 24.8 kg/m. GEN: Well nourished, well developed, in no acute distress  HEENT: normal  Neck: no JVD, carotid bruits, or masses Cardiac: RRR; no murmurs, rubs, or gallops,no edema  Respiratory:  clear to auscultation bilaterally, normal work of breathing GI: soft, nontender, nondistended, + BS MS: no deformity or atrophy  Skin: warm and dry Neuro:  Strength and sensation are intact Psych: euthymic mood, full affect  EKG:  EKG is ordered today. Personal review of the ekg ordered shows sinus rhythm, rate 64  Recent Labs: 08/13/2020: Magnesium 2.2; TSH 1.760 10/27/2020: BUN 9; Creatinine, Ser 0.66; Hemoglobin 16.6; Platelets 217; Potassium 4.4; Sodium 140    Lipid Panel  No results found for: CHOL, TRIG, HDL, CHOLHDL, VLDL, LDLCALC, LDLDIRECT   Wt Readings from Last 3 Encounters:  12/08/20 140 lb (63.5 kg)  11/13/20 142 lb (64.4 kg)  10/30/20 142 lb (64.4 kg)      Other studies Reviewed: Additional studies/ records that were reviewed today include: TTE 12/05/20  Review of the above records today demonstrates:   1. Left ventricular ejection fraction, by estimation, is 55%. The left  ventricle has normal function. The left ventricle has no regional wall  motion abnormalities. Left ventricular diastolic parameters were grossly  normal.   2. Right ventricular systolic function is normal. The right ventricular  size is mildly enlarged. There is normal pulmonary artery systolic   pressure. The estimated right ventricular systolic pressure is 26.8 mmHg.   3. Left atrial size was moderately dilated.   4. Right atrial size was mild to moderately dilated.   5. The mitral valve is normal in structure. Mild mitral valve  regurgitation. No evidence of mitral stenosis.   6. Tricuspid valve regurgitation is mild to moderate.   7. The aortic valve is grossly normal. There is mild calcification of the  aortic valve. Aortic valve regurgitation is not visualized. No aortic  stenosis is present.   8. The inferior vena cava is normal in size with greater than 50%  respiratory variability, suggesting right atrial pressure of 3 mmHg.    ASSESSMENT AND PLAN:  1.  Persistent atrial fibrillation/flutter: Currently on Eliquis and metoprolol.  CHA2DS2-VASc of 3.  She would like to get back into normal rhythm.  There are multiple options including medications and ablation.  We discussed both of these options.  She would like to go home and think, though I do think she Keishaun Hazel agree to ablation  Risk, benefits, and alternatives to EP study and radiofrequency ablation for afib were also discussed in detail today. These risks include but are not limited to stroke, bleeding, vascular damage, tamponade, perforation, damage to the esophagus, lungs, and other structures, pulmonary vein stenosis, worsening renal function, and death. The patient understands these risk.  She Murrell Elizondo discuss this further with her family.   Case discussed with primary cardiology  Current medicines are reviewed at length with the patient today.   The patient does not have concerns regarding her medicines.  The following changes were made today:  none  Labs/ tests ordered today include:  Orders Placed This Encounter  Procedures   EKG 12-Lead     Disposition:   FU with Rocky Gladden 3 months  Signed, Shantal Roan Meredith Leeds, MD  12/08/2020 10:15 AM     [address] [place]  [id number] [phone] (office) [phone] (fax)

## 2020-12-08 NOTE — Patient Instructions (Signed)
Medication Instructions:  Your physician recommends that you continue on your current medications as directed. Please refer to the Current Medication list given to you today.  *If you need a refill on your cardiac medications before your next appointment, please call your pharmacy*   Lab Work: None ordered   Testing/Procedures: None ordered   Follow-Up: At [facility], you and your health needs are our priority.  As part of our continuing mission to provide you with exceptional heart care, we have created designated Provider Care Teams.  These Care Teams include your primary Cardiologist (physician) and Advanced Practice Providers (APPs -  Physician Assistants and Nurse Practitioners) who all work together to provide you with the care you need, when you need it.  Your next appointment:   3 month(s)  The format for your next appointment:   In Person  Provider:   Allegra Lai, MD    Thank you for choosing [facility]!!   Angela Curet, RN [phone]   Other Instructions  Cardiac Ablation Cardiac ablation is a procedure to destroy (ablate) some heart tissue that is sending bad signals. These bad signals causeproblems in heart rhythm. The heart has many areas that make these signals. If there are problems in these areas, they can make the heart beat in a way that is not normal.Destroying some tissues can help make the heart rhythm normal. Tell your doctor about: Any allergies you have. All medicines you are taking. These include vitamins, herbs, eye drops, creams, and over-the-counter medicines. Any problems you or family members have had with medicines that make you fall asleep (anesthetics). Any blood disorders you have. Any surgeries you have had. Any medical conditions you have, such as kidney failure. Whether you are pregnant or may be pregnant. What are the risks? This is a safe procedure. But problems may occur, including: Infection. Bruising and  bleeding. Bleeding into the chest. Stroke or blood clots. Damage to nearby areas of your body. Allergies to medicines or dyes. The need for a pacemaker if the normal system is damaged. Failure of the procedure to treat the problem. What happens before the procedure? Medicines Ask your doctor about: Changing or stopping your normal medicines. This is important. Taking aspirin and ibuprofen. Do not take these medicines unless your doctor tells you to take them. Taking other medicines, vitamins, herbs, and supplements. General instructions Follow instructions from your doctor about what you cannot eat or drink. Plan to have someone take you home from the hospital or clinic. If you will be going home right after the procedure, plan to have someone with you for 24 hours. Ask your doctor what steps will be taken to prevent infection. What happens during the procedure?  An IV tube will be put into one of your veins. You will be given a medicine to help you relax. The skin on your neck or groin will be numbed. A cut (incision) will be made in your neck or groin. A needle will be put through your cut and into a large vein. A tube (catheter) will be put into the needle. The tube will be moved to your heart. Dye may be put through the tube. This helps your doctor see your heart. Small devices (electrodes) on the tube will send out signals. A type of energy will be used to destroy some heart tissue. The tube will be taken out. Pressure will be held on your cut. This helps stop bleeding. A bandage will be put over your cut. The  exact procedure may vary among doctors and hospitals. What happens after the procedure? You will be watched until you leave the hospital or clinic. This includes checking your heart rate, breathing rate, oxygen, and blood pressure. Your cut will be watched for bleeding. You will need to lie still for a few hours. Do not drive for 24 hours or as long as your doctor tells  you. Summary Cardiac ablation is a procedure to destroy some heart tissue. This is done to treat heart rhythm problems. Tell your doctor about any medical conditions you may have. Tell him or her about all medicines you are taking to treat them. This is a safe procedure. But problems may occur. These include infection, bruising, bleeding, and damage to nearby areas of your body. Follow what your doctor tells you about food and drink. You may also be told to change or stop some of your medicines. After the procedure, do not drive for 24 hours or as long as your doctor tells you. This information is not intended to replace advice given to you by your health care provider. Make sure you discuss any questions you have with your healthcare provider. Document Revised: 05/17/2019 Document Reviewed: 05/17/2019 Elsevier Patient Education  [place].   Flecainide Tablets What is this medication? FLECAINIDE (FLEK a nide) prevents and treats a fast or irregular heartbeat (arrhythmia). It is often used to treat a type of arrhythmia known as AFib (atrial fibrillation). It works by slowing down overactive electric signals in the heart, which stabilizes your heart rhythm. It belongs to a group ofmedications called antiarrhythmics. This medicine may be used for other purposes; ask your health care provider orpharmacist if you have questions. COMMON BRAND NAME(S): Tambocor What should I tell my care team before I take this medication? They need to know if you have any of these conditions: Abnormal levels of potassium in the blood Heart disease including heart rhythm and heart rate problems Kidney or liver disease Recent heart attack An unusual or allergic reaction to flecainide, local anesthetics, other medications, foods, dyes, or preservatives Pregnant or trying to get pregnant Breast-feeding How should I use this medication? Take this medication by mouth with a glass of water. Follow the  directions on the prescription label. You can take this medication with or without food. Take your doses at regular intervals. Do not take your medication more often than directed. Do not stop taking this medication suddenly. This may cause serious, heart-related side effects. If your care team wants you to stop the medication,the dose may be slowly lowered over time to avoid any side effects. Talk to your care team regarding the use of this medication in children. While this medication may be prescribed for children as young as 1 year of age forselected conditions, precautions do apply. Overdosage: If you think you have taken too much of this medicine contact apoison control center or emergency room at once. NOTE: This medicine is only for you. Do not share this medicine with others. What if I miss a dose? If you miss a dose, take it as soon as you can. If it is almost time for yournext dose, take only that dose. Do not take double or extra doses. What may interact with this medication? Do not take this medication with any of the following: Amoxapine Arsenic trioxide Certain antibiotics like clarithromycin, erythromycin, gatifloxacin, gemifloxacin, levofloxacin, moxifloxacin, sparfloxacin, or troleandomycin Certain antidepressants called tricyclic antidepressants like amitriptyline, imipramine, or nortriptyline Certain medications to control heart rhythm like  disopyramide, encainide, moricizine, procainamide, propafenone, and quinidine Cisapride Delavirdine Droperidol Haloperidol [place] Imatinib Levomethadyl Maprotiline Medications for malaria like chloroquine and halofantrine Pentamidine Phenothiazines like chlorpromazine, mesoridazine, prochlorperazine, thioridazine Pimozide Quinine Ranolazine Ritonavir Sertindole This medication may also interact with the following: Cimetidine Dofetilide Medications for angina or high blood pressure Medications to control heart rhythm like  amiodarone and digoxin Ziprasidone This list may not describe all possible interactions. Give your health care provider a list of all the medicines, herbs, non-prescription drugs, or dietary supplements you use. Also tell them if you smoke, drink alcohol, or use illegaldrugs. Some items may interact with your medicine. What should I watch for while using this medication? Visit your care team for regular checks on your progress. Because your condition and the use of this medication carries some risk, it is a good idea to carry an identification card, necklace or bracelet with details of yourcondition, medications, and care team. Check your blood pressure and pulse rate regularly. Ask your care team what your blood pressure and pulse rate should be, and when you should contact them. Your care team also may schedule regular blood tests and electrocardiograms tocheck your progress. You may get drowsy or dizzy. Do not drive, use machinery, or do anything that needs mental alertness until you know how this medication affects you. Do not stand or sit up quickly, especially if you are an older patient. This reduces the risk of dizzy or fainting spells. Alcohol can make you more dizzy, increaseflushing and rapid heartbeats. Avoid alcoholic drinks. What side effects may I notice from receiving this medication? Side effects that you should report to your care team as soon as possible: Allergic reactions-skin rash, itching, hives, swelling of the face, lips, tongue, or throat Heart failure-shortness of breath, swelling of the ankles, feet, or hands, sudden weight gain, unusual weakness or fatigue Heart rhythm changes-fast or irregular heartbeat, dizziness, feeling faint or lightheaded, chest pain, trouble breathing Liver injury-right upper belly pain, loss of appetite, nausea, light-colored stool, dark yellow or brown urine, yellowing skin or eyes, unusual weakness or fatigue Side effects that usually do not  require medical attention (report to your careteam if they continue or are bothersome): Blurry vision Constipation Dizziness Fatigue Headache Nausea Tremors or shaking This list may not describe all possible side effects. Call your doctor for medical advice about side effects. You may report side effects to FDA at1-800-FDA-1088. Where should I keep my medication? Keep out of the reach of children and pets. Store at room temperature between 15 and 30 degrees C (59 and 86 degrees F). Protect from light. Keep container tightly closed. Throw away any unusedmedication after the expiration date. NOTE: This sheet is a summary. It may not cover all possible information. If you have questions about this medicine, talk to your doctor, pharmacist, orhealth care provider.  2022 Elsevier/Gold Standard (2020-07-17 12:17:39)

## 2020-12-09 ENCOUNTER — Telehealth: Payer: Self-pay | Admitting: Cardiology

## 2020-12-09 NOTE — Telephone Encounter (Signed)
Patient was calling back to discuss getting the abdominal ablation done, and wants to go ahead a schedule.  Please advise

## 2020-12-10 NOTE — Telephone Encounter (Signed)
Apologized for delay in returning her call. Afib ablation date held for 8/3 Pt aware I will be in touch at later date to go over instructions. Patient verbalized understanding and agreeable to plan.

## 2020-12-10 NOTE — Telephone Encounter (Signed)
Patient call back to say no one has call her back. Please advise

## 2020-12-22 ENCOUNTER — Other Ambulatory Visit: Payer: Self-pay | Admitting: Cardiovascular Disease

## 2020-12-22 NOTE — Telephone Encounter (Signed)
19f 63.5kg, scr 0.66 10/27/20, lovw/camnitz 12/08/20

## 2021-01-14 DIAGNOSIS — H40053 Ocular hypertension, bilateral: Secondary | ICD-10-CM | POA: Diagnosis not present

## 2021-01-15 DIAGNOSIS — M47816 Spondylosis without myelopathy or radiculopathy, lumbar region: Secondary | ICD-10-CM | POA: Diagnosis not present

## 2021-01-15 DIAGNOSIS — R030 Elevated blood-pressure reading, without diagnosis of hypertension: Secondary | ICD-10-CM | POA: Diagnosis not present

## 2021-01-15 DIAGNOSIS — M419 Scoliosis, unspecified: Secondary | ICD-10-CM | POA: Diagnosis not present

## 2021-01-16 ENCOUNTER — Ambulatory Visit: Payer: Medicare Other | Admitting: Cardiovascular Disease

## 2021-01-16 ENCOUNTER — Other Ambulatory Visit: Payer: Self-pay

## 2021-01-16 ENCOUNTER — Encounter: Payer: Self-pay | Admitting: Cardiovascular Disease

## 2021-01-16 VITALS — BP 110/64 | HR 76 | Ht 64.0 in | Wt 140.4 lb

## 2021-01-16 DIAGNOSIS — I4819 Other persistent atrial fibrillation: Secondary | ICD-10-CM | POA: Diagnosis not present

## 2021-01-16 NOTE — Patient Instructions (Signed)
Medication Instructions:  No changes *If you need a refill on your cardiac medications before your next appointment, please call your pharmacy*   Lab Work: None ordered If you have labs (blood work) drawn today and your tests are completely normal, you will receive your results only by: [place] (if you have MyChart) OR A paper copy in the mail If you have any lab test that is abnormal or we need to change your treatment, we will call you to review the results.   Testing/Procedures: None ordered   Follow-Up: At [facility], you and your health needs are our priority.  As part of our continuing mission to provide you with exceptional heart care, we have created designated Provider Care Teams.  These Care Teams include your primary Cardiologist (physician) and Advanced Practice Providers (APPs -  Physician Assistants and Nurse Practitioners) who all work together to provide you with the care you need, when you need it.  We recommend signing up for the patient portal called "MyChart".  Sign up information is provided on this After Visit Summary.  MyChart is used to connect with patients for Virtual Visits (Telemedicine).  Patients are able to view lab/test results, encounter notes, upcoming appointments, etc.  Non-urgent messages can be sent to your provider as well.   To learn more about what you can do with MyChart, go to [email or web address].    Your next appointment:   6 month(s)  The format for your next appointment:   In Person  Provider:   You may see Sanda Klein, MD or one of the following Advanced Practice Providers on your designated Care Team:   Almyra Deforest, PA-C Fabian Sharp, PA-C or  Roby Lofts, [place]

## 2021-01-16 NOTE — Progress Notes (Signed)
Cardiology Office Note    Date:  01/17/2021   ID:  Angela, Morales 01/22/1946, MRN [id number]  PCP:  Deland Pretty, MD  Cardiologist:   Sanda Klein, MD   Chief Complaint  Patient presents with   Atrial Fibrillation     History of Present Illness:  Angela Morales is a 75 y.o. female with what appears to be "lone" atrial fibrillation.  She is a retired Designer, jewellery.  She had many years of paroxysmal atrial fibrillation, but within the last year she developed persistent atrial fibrillation with symptomatic despite good rate control and required cardioversion.  She is scheduled for an ablation with Dr. Curt Bears on August 3.  She has not had any recent bleeding or falls, but has a remote history of traumatic hemarthrosis of the right knee.  Not recently troubled by palpitations, dyspnea, chest pain, focal neurological complaints, lower extremity edema or claudication.  Denies dizziness and syncope.   Her first detected episode of atrial fibrillation was related to an acute diarrheal illness, eventually proven to the true celiac disease.  Since then her GI symptoms have improved substantially on a gluten-free diet.  Previous workup has demonstrated the absence of significant structural cardiac abnormalities. Both the right and left atrium were described as being normal in size by echocardiography. There are no valvular problems.  the rhythm is sinus with PACs.  Past Medical History:  Diagnosis Date   Abnormal uterine bleeding    on HRT   Allergy    seasonal   Arthritis    Atrial fibrillation (HCC)    Celiac disease    DDD (degenerative disc disease)    Dysrhythmia    Glaucoma    Microscopic colitis    Osteoarthritis of both knees    PONV (postoperative nausea and vomiting)    SVT (supraventricular tachycardia) ([place])     Past Surgical History:  Procedure Laterality Date   APPENDECTOMY     CARDIOVERSION N/A 10/30/2020   Procedure: CARDIOVERSION;  Surgeon:  Jerline Pain, MD;  Location: [facility];  Service: Cardiovascular;  Laterality: N/A;   COLONOSCOPY     FOOT FRACTURE SURGERY  10/2013   TONSILLECTOMY AND ADENOIDECTOMY     TOTAL KNEE ARTHROPLASTY Left 03/22/2016   Procedure: TOTAL KNEE ARTHROPLASTY;  Surgeon: Dorna Leitz, MD;  Location: [facility];  Service: Orthopedics;  Laterality: Left;   TUBAL LIGATION  1981   VAGINAL HYSTERECTOMY  04/20/05   prolapse, adenomyosis    Current Medications: Outpatient Medications Prior to Visit  Medication Sig Dispense Refill   apixaban (ELIQUIS) 5 MG TABS tablet TAKE 1 TABLET BY MOUTH TWICE A DAY 180 tablet 1   ibuprofen (ADVIL,MOTRIN) 200 MG tablet Take 400 mg by mouth daily as needed (arthritis pain.).     metoprolol succinate (TOPROL-XL) 25 MG 24 hr tablet TAKE 1 TABLET BY MOUTH EVERY DAY (Patient taking differently: Take 25 mg by mouth at bedtime.) 90 tablet 3   Polyethyl Glycol-Propyl Glycol (SYSTANE) 0.4-0.3 % SOLN Place 1-2 drops into both eyes 3 (three) times daily as needed (dry/irritated eyes).     psyllium (METAMUCIL) 58.6 % packet Take 1 packet by mouth in the morning.     timolol (TIMOPTIC) 0.5 % ophthalmic solution Place 1 drop into the left eye 2 (two) times daily.     ZIOPTAN 0.0015 % SOLN Place 1 drop into the left eye at bedtime.     gabapentin (NEURONTIN) 300 MG capsule Take 300 mg by mouth 3 (three)  times daily. (Patient not taking: Reported on 01/16/2021)     ketorolac (ACULAR) 0.5 % ophthalmic solution 1 drop as needed. (Patient not taking: Reported on 01/16/2021)     loratadine (CLARITIN) 10 MG tablet Take 10 mg by mouth daily as needed for allergies. (Patient not taking: Reported on 01/16/2021)     XELPROS 0.005 % EMUL Place 1 drop into the left eye at bedtime. (Patient not taking: Reported on 01/16/2021)     Facility-Administered Medications Prior to Visit  Medication Dose Route Frequency Provider Last Rate Last Admin   0.9 %  sodium chloride infusion  500 mL Intravenous Continuous  [name], Estill Cotta III, MD         Allergies:   Gluten meal and Cefdinir   Social History   Socioeconomic History   Marital status: Married    Spouse name: Not on file   Number of children: 3   Years of education: Not on file   Highest education level: Not on file  Occupational History   Occupation: retired  Tobacco Use   Smoking status: Never   Smokeless tobacco: Never  Substance and Sexual Activity   Alcohol use: Yes    Alcohol/week: 4.0 - 6.0 standard drinks    Types: 4 - 6 Glasses of wine per week    Comment: occasional   Drug use: No   Sexual activity: Yes    Partners: Male    Birth control/protection: Surgical    Comment: TVH  Other Topics Concern   Not on file  Social History Narrative   Not on file   Social Determinants of Health   Financial Resource Strain: Not on file  Food Insecurity: Not on file  Transportation Needs: Not on file  Physical Activity: Not on file  Stress: Not on file  Social Connections: Not on file     Family History:  The patient's family history includes Bipolar disorder in her son; Breast cancer in her cousin; Heart disease (age of onset: 18) in her brother; Liver disease in her son; Osteoarthritis in her maternal grandmother; Osteoporosis in her mother; Prostate cancer in her father; Stroke in her mother.   ROS:   Please see the history of present illness.    ROS All other systems reviewed and are negative.   PHYSICAL EXAM:   VS:  BP 110/64 (BP Location: Left Arm, Patient Position: Sitting, Cuff Size: Normal)   Pulse 76   Ht 5' 4"  (1.626 m)   Wt 140 lb 6.4 oz (63.7 kg)   LMP 04/20/2005   SpO2 99%   BMI 24.10 kg/m     General: Alert, oriented x3, no distress, appears lean and fit Head: no evidence of trauma, PERRL, EOMI, no exophtalmos or lid lag, no myxedema, no xanthelasma; normal ears, nose and oropharynx Neck: normal jugular venous pulsations and no hepatojugular reflux; brisk carotid pulses without delay and no carotid  bruits Chest: clear to auscultation, no signs of consolidation by percussion or palpation, normal fremitus, symmetrical and full respiratory excursions Cardiovascular: normal position and quality of the apical impulse, regular rhythm, normal first and second heart sounds, no murmurs, rubs or gallops Abdomen: no tenderness or distention, no masses by palpation, no abnormal pulsatility or arterial bruits, normal bowel sounds, no hepatosplenomegaly Extremities: no clubbing, cyanosis or edema; 2+ radial, ulnar and brachial pulses bilaterally; 2+ right femoral, posterior tibial and dorsalis pedis pulses; 2+ left femoral, posterior tibial and dorsalis pedis pulses; no subclavian or femoral bruits Neurological: grossly nonfocal Psych:  Normal mood and affect   Wt Readings from Last 3 Encounters:  01/16/21 140 lb 6.4 oz (63.7 kg)  12/08/20 140 lb (63.5 kg)  11/13/20 142 lb (64.4 kg)     Studies/Labs Reviewed:   ECHO 12/01/2020   1. Left ventricular ejection fraction, by estimation, is 55%. The left  ventricle has normal function. The left ventricle has no regional wall  motion abnormalities. Left ventricular diastolic parameters were grossly  normal.   2. Right ventricular systolic function is normal. The right ventricular  size is mildly enlarged. There is normal pulmonary artery systolic  pressure. The estimated right ventricular systolic pressure is 76.1 mmHg.   3. Left atrial size was moderately dilated.   4. Right atrial size was mild to moderately dilated.   5. The mitral valve is normal in structure. Mild mitral valve  regurgitation. No evidence of mitral stenosis.   6. Tricuspid valve regurgitation is mild to moderate.   7. The aortic valve is grossly normal. There is mild calcification of the  aortic valve. Aortic valve regurgitation is not visualized. No aortic  stenosis is present.   8. The inferior vena cava is normal in size with greater than 50%  respiratory variability,  suggesting right atrial pressure of 3 mmHg.   EKG:  EKG is ordered today.  It shows atrial fibrillation with controlled ventricular rate and is otherwise a normal tracing.  BMET    Component Value Date/Time   NA 140 10/27/2020 1042   K 4.4 10/27/2020 1042   CL 101 10/27/2020 1042   CO2 28 10/27/2020 1042   GLUCOSE 74 10/27/2020 1042   GLUCOSE 97 07/21/2016 1022   BUN 9 10/27/2020 1042   CREATININE 0.66 10/27/2020 1042   CALCIUM 9.6 10/27/2020 1042   GFRNONAA >60 03/23/2016 0315   GFRAA >60 03/23/2016 0315  10/20/2018 creatinine 0.77, normal liver function tests  Lipid Panel  No results found for: CHOL, TRIG, HDL, CHOLHDL, VLDL, LDLCALC, LDLDIRECT, LABVLDL  10/24/2019 Total cholesterol 161, HDL 51, triglycerides 93, calculated LDL 93  ASSESSMENT:    1. Persistent atrial fibrillation (HCC)       PLAN:  In order of problems listed above:  1.  Persistent atrial fibrillation: Symptomatic even when well rate controlled.  On anticoagulation.  Planning ablation with Dr. Alfred Levins on August 3.  We went over some of the logistics of the procedure and the expectations following the procedure, pointing out that she will need to remain on anticoagulants and may have more arrhythmia in the first 3 months or so after the ablation.  Note that it is very possible that Angela Morales has some degree of AV conduction abnormality since her atrial fibrillation was very well rate controlled on a tiny dose of beta-blocker. 2.  Celiac disease: Currently well compensated. 3.  Glaucoma: On beta-blocker eyedrops.   Risk Assessment/Calculations:    CHA2DS2-VASc Score = 3  This indicates a 3.2% annual risk of stroke. The patient's score is based upon: CHF History: No HTN History: No Diabetes History: No Stroke History: No Vascular Disease History: No Age Score: 2 Gender Score: 1   Shared Decision Making/Informed Consent The risks (stroke, cardiac arrhythmias rarely resulting in the need  for a temporary or permanent pacemaker, skin irritation or burns and complications associated with conscious sedation including aspiration, arrhythmia, respiratory failure and death), benefits (restoration of normal sinus rhythm) and alternatives of a direct current cardioversion were explained in detail to Angela Morales and she agrees to proceed.  Medication Adjustments/Labs and Tests Ordered: Current medicines are reviewed at length with the patient today.  Concerns regarding medicines are outlined above.  No orders of the defined types were placed in this encounter.  No orders of the defined types were placed in this encounter.   Patient Instructions  Medication Instructions:  No changes *If you need a refill on your cardiac medications before your next appointment, please call your pharmacy*   Lab Work: None ordered If you have labs (blood work) drawn today and your tests are completely normal, you will receive your results only by: [place] (if you have MyChart) OR A paper copy in the mail If you have any lab test that is abnormal or we need to change your treatment, we will call you to review the results.   Testing/Procedures: None ordered   Follow-Up: At [facility], you and your health needs are our priority.  As part of our continuing mission to provide you with exceptional heart care, we have created designated Provider Care Teams.  These Care Teams include your primary Cardiologist (physician) and Advanced Practice Providers (APPs -  Physician Assistants and Nurse Practitioners) who all work together to provide you with the care you need, when you need it.  We recommend signing up for the patient portal called "MyChart".  Sign up information is provided on this After Visit Summary.  MyChart is used to connect with patients for Virtual Visits (Telemedicine).  Patients are able to view lab/test results, encounter notes, upcoming appointments, etc.   Non-urgent messages can be sent to your provider as well.   To learn more about what you can do with MyChart, go to [email or web address].    Your next appointment:   6 month(s)  The format for your next appointment:   In Person  Provider:   You may see Sanda Klein, MD or one of the following Advanced Practice Providers on your designated Care Team:   Almyra Deforest, PA-C Fabian Sharp, [place] or  Roby Lofts, PA-C    Signed, Sanda Klein, MD  01/17/2021 3:48 PM    [place]

## 2021-01-17 ENCOUNTER — Encounter: Payer: Self-pay | Admitting: Cardiovascular Disease

## 2021-01-20 ENCOUNTER — Encounter: Payer: Self-pay | Admitting: *Deleted

## 2021-01-20 ENCOUNTER — Telehealth: Payer: Self-pay | Admitting: Cardiology

## 2021-01-20 DIAGNOSIS — I4819 Other persistent atrial fibrillation: Secondary | ICD-10-CM

## 2021-01-20 DIAGNOSIS — Z01812 Encounter for preprocedural laboratory examination: Secondary | ICD-10-CM

## 2021-01-20 NOTE — Telephone Encounter (Signed)
New Message;     Patient said she is suppose to have an Ablation on 01-28-21 and have not received any instructions.

## 2021-01-20 NOTE — Telephone Encounter (Signed)
Spoke with pt and let her know Dr. Macky Lower RN will follow up with her in the next day to discuss procedure instructions.

## 2021-01-20 NOTE — Telephone Encounter (Signed)
Reviewed instructions w/ pt, she will pick up instructions tomorrow. Pt will stop by the office tomorrow for blood work. Aware hospital will call to arrange CT.  Patient verbalized understanding and agreeable to plan.

## 2021-01-21 ENCOUNTER — Telehealth (HOSPITAL_COMMUNITY): Payer: Self-pay | Admitting: Emergency Medicine

## 2021-01-21 NOTE — Telephone Encounter (Signed)
Reaching out to patient to offer assistance regarding upcoming cardiac imaging study; pt verbalizes understanding of appt date/time, parking situation and where to check in, pre-test NPO status and medications ordered, and verified current allergies; name and call back number provided for further questions should they arise Marchia Bond RN Navigator Cardiac Imaging [facility] [phone] office [phone] cell   Denies iv issues Denies claustro States will get labs tomorrow at [address] office

## 2021-01-22 ENCOUNTER — Other Ambulatory Visit: Payer: Self-pay

## 2021-01-22 ENCOUNTER — Other Ambulatory Visit: Payer: Medicare Other | Admitting: *Deleted

## 2021-01-22 DIAGNOSIS — Z01812 Encounter for preprocedural laboratory examination: Secondary | ICD-10-CM | POA: Diagnosis not present

## 2021-01-22 DIAGNOSIS — I4819 Other persistent atrial fibrillation: Secondary | ICD-10-CM

## 2021-01-22 LAB — CBC
Hematocrit: 48.2 % — ABNORMAL HIGH (ref 34.0–46.6)
Hemoglobin: 16 g/dL — ABNORMAL HIGH (ref 11.1–15.9)
MCH: 33.5 pg — ABNORMAL HIGH (ref 26.6–33.0)
MCHC: 33.2 g/dL (ref 31.5–35.7)
MCV: 101 fL — ABNORMAL HIGH (ref 79–97)
Platelets: 210 10*3/uL (ref 150–450)
RBC: 4.77 x10E6/uL (ref 3.77–5.28)
RDW: 12 % (ref 11.7–15.4)
WBC: 7.4 10*3/uL (ref 3.4–10.8)

## 2021-01-22 LAB — BASIC METABOLIC PANEL
BUN/Creatinine Ratio: 11 — ABNORMAL LOW (ref 12–28)
BUN: 8 mg/dL (ref 8–27)
CO2: 26 mmol/L (ref 20–29)
Calcium: 10.2 mg/dL (ref 8.7–10.3)
Chloride: 98 mmol/L (ref 96–106)
Creatinine, Ser: 0.73 mg/dL (ref 0.57–1.00)
Glucose: 88 mg/dL (ref 65–99)
Potassium: 4.6 mmol/L (ref 3.5–5.2)
Sodium: 137 mmol/L (ref 134–144)
eGFR: 86 mL/min/{1.73_m2} (ref >59)

## 2021-01-23 ENCOUNTER — Ambulatory Visit (HOSPITAL_COMMUNITY)
Admission: RE | Admit: 2021-01-23 | Discharge: 2021-01-23 | Disposition: A | Discharge status: Home / Self Care | Payer: Medicare Other | Source: Ambulatory Visit | Attending: Cardiology | Admitting: Cardiology

## 2021-01-23 DIAGNOSIS — I4819 Other persistent atrial fibrillation: Secondary | ICD-10-CM | POA: Diagnosis not present

## 2021-01-23 MED ORDER — IOHEXOL 350 MG/ML SOLN
100.0000 mL | Freq: Once | INTRAVENOUS | Status: AC | PRN
  Administered 2021-01-23: 100 mL via INTRAVENOUS

## 2021-01-23 MED ORDER — DILTIAZEM HCL 25 MG/5ML IV SOLN
INTRAVENOUS | Status: AC
  Filled 2021-01-23: qty 5

## 2021-01-23 MED ORDER — DILTIAZEM HCL 25 MG/5ML IV SOLN: 10.0000 mg | Freq: Once | INTRAVENOUS | Status: DC

## 2021-01-27 ENCOUNTER — Telehealth: Payer: Self-pay | Admitting: Cardiology

## 2021-01-27 NOTE — Telephone Encounter (Signed)
Reviewed procedure instructions with patient, answered all questions. Patient verbalized understanding and agreeable to plan.

## 2021-01-27 NOTE — Telephone Encounter (Signed)
New message:     Patient calling to speak with some one concering her CATH LAB

## 2021-01-28 ENCOUNTER — Ambulatory Visit (HOSPITAL_COMMUNITY)
Admission: RE | Admit: 2021-01-28 | Discharge: 2021-01-28 | Disposition: A | Discharge status: Home / Self Care | Payer: Medicare Other | Source: Ambulatory Visit | Attending: Cardiology | Admitting: Cardiology

## 2021-01-28 ENCOUNTER — Ambulatory Visit (HOSPITAL_COMMUNITY): Payer: Medicare Other | Admitting: Certified Registered Nurse Anesthetist

## 2021-01-28 ENCOUNTER — Encounter (HOSPITAL_COMMUNITY)
Admission: RE | Disposition: A | Discharge status: Home / Self Care | Payer: Self-pay | Source: Ambulatory Visit | Attending: Cardiology

## 2021-01-28 ENCOUNTER — Encounter (HOSPITAL_COMMUNITY): Payer: Self-pay | Admitting: Cardiology

## 2021-01-28 ENCOUNTER — Other Ambulatory Visit: Payer: Self-pay

## 2021-01-28 DIAGNOSIS — Z90710 Acquired absence of both cervix and uterus: Secondary | ICD-10-CM | POA: Insufficient documentation

## 2021-01-28 DIAGNOSIS — Z9049 Acquired absence of other specified parts of digestive tract: Secondary | ICD-10-CM | POA: Insufficient documentation

## 2021-01-28 DIAGNOSIS — K900 Celiac disease: Secondary | ICD-10-CM | POA: Insufficient documentation

## 2021-01-28 DIAGNOSIS — Z96652 Presence of left artificial knee joint: Secondary | ICD-10-CM | POA: Insufficient documentation

## 2021-01-28 DIAGNOSIS — I4892 Unspecified atrial flutter: Secondary | ICD-10-CM | POA: Insufficient documentation

## 2021-01-28 DIAGNOSIS — Z881 Allergy status to other antibiotic agents status: Secondary | ICD-10-CM | POA: Diagnosis not present

## 2021-01-28 DIAGNOSIS — H409 Unspecified glaucoma: Secondary | ICD-10-CM | POA: Diagnosis not present

## 2021-01-28 DIAGNOSIS — I4819 Other persistent atrial fibrillation: Secondary | ICD-10-CM | POA: Insufficient documentation

## 2021-01-28 DIAGNOSIS — Z8249 Family history of ischemic heart disease and other diseases of the circulatory system: Secondary | ICD-10-CM | POA: Diagnosis not present

## 2021-01-28 DIAGNOSIS — M1712 Unilateral primary osteoarthritis, left knee: Secondary | ICD-10-CM | POA: Diagnosis not present

## 2021-01-28 DIAGNOSIS — I959 Hypotension, unspecified: Secondary | ICD-10-CM | POA: Diagnosis not present

## 2021-01-28 DIAGNOSIS — I4891 Unspecified atrial fibrillation: Secondary | ICD-10-CM | POA: Diagnosis not present

## 2021-01-28 HISTORY — PX: ATRIAL FIBRILLATION ABLATION: EP1191

## 2021-01-28 LAB — POCT ACTIVATED CLOTTING TIME
Activated Clotting Time: 399 seconds
Activated Clotting Time: 317 seconds
Activated Clotting Time: 341 seconds
Activated Clotting Time: 329 seconds

## 2021-01-28 SURGERY — ATRIAL FIBRILLATION ABLATION
Anesthesia: General

## 2021-01-28 MED ORDER — AMISULPRIDE (ANTIEMETIC) 5 MG/2ML IV SOLN: 10.0000 mg | Freq: Once | INTRAVENOUS | Status: DC | PRN

## 2021-01-28 MED ORDER — PROPOFOL 10 MG/ML IV BOLUS
INTRAVENOUS | Status: DC | PRN
  Administered 2021-01-28: 130 mg via INTRAVENOUS

## 2021-01-28 MED ORDER — SODIUM CHLORIDE 0.9% FLUSH: 3.0000 mL | INTRAVENOUS | Status: DC | PRN

## 2021-01-28 MED ORDER — PROTAMINE SULFATE 10 MG/ML IV SOLN
INTRAVENOUS | Status: DC | PRN
Start: 2021-01-28 — End: 2021-01-28
  Administered 2021-01-28: 40 mg via INTRAVENOUS
  Administered 2021-01-28: 30 mg via INTRAVENOUS

## 2021-01-28 MED ORDER — OXYCODONE HCL 5 MG/5ML PO SOLN: 5.0000 mg | Freq: Once | ORAL | Status: DC | PRN

## 2021-01-28 MED ORDER — FENTANYL CITRATE (PF) 100 MCG/2ML IJ SOLN
INTRAMUSCULAR | Status: DC | PRN
  Administered 2021-01-28: 100 ug via INTRAVENOUS

## 2021-01-28 MED ORDER — SODIUM CHLORIDE 0.9 % IV SOLN: 250.0000 mL | INTRAVENOUS | Status: DC | PRN

## 2021-01-28 MED ORDER — DEXAMETHASONE SODIUM PHOSPHATE 10 MG/ML IJ SOLN
INTRAMUSCULAR | Status: DC | PRN
  Administered 2021-01-28: 5 mg via INTRAVENOUS

## 2021-01-28 MED ORDER — LIDOCAINE 2% (20 MG/ML) 5 ML SYRINGE
INTRAMUSCULAR | Status: DC | PRN
  Administered 2021-01-28: 60 mg via INTRAVENOUS

## 2021-01-28 MED ORDER — SODIUM CHLORIDE 0.9% FLUSH: 3.0000 mL | Freq: Two times a day (BID) | INTRAVENOUS | Status: DC

## 2021-01-28 MED ORDER — HEPARIN (PORCINE) IN NACL 1000-0.9 UT/500ML-% IV SOLN
INTRAVENOUS | Status: DC | PRN
  Administered 2021-01-28 (×5): 500 mL

## 2021-01-28 MED ORDER — SODIUM CHLORIDE 0.9 % IV SOLN: INTRAVENOUS | Status: DC

## 2021-01-28 MED ORDER — ACETAMINOPHEN 325 MG PO TABS: 650.0000 mg | ORAL_TABLET | ORAL | Status: DC | PRN

## 2021-01-28 MED ORDER — PHENYLEPHRINE HCL-NACL 20-0.9 MG/250ML-% IV SOLN
INTRAVENOUS | Status: DC | PRN
  Administered 2021-01-28: 60 ug/min via INTRAVENOUS

## 2021-01-28 MED ORDER — ONDANSETRON HCL 4 MG/2ML IJ SOLN: 4.0000 mg | Freq: Four times a day (QID) | INTRAMUSCULAR | Status: DC | PRN

## 2021-01-28 MED ORDER — HEPARIN SODIUM (PORCINE) 1000 UNIT/ML IJ SOLN
INTRAMUSCULAR | Status: DC | PRN
  Administered 2021-01-28: 1000 [IU] via INTRAVENOUS

## 2021-01-28 MED ORDER — MEPERIDINE HCL 25 MG/ML IJ SOLN: 6.2500 mg | INTRAMUSCULAR | Status: DC | PRN

## 2021-01-28 MED ORDER — HEPARIN (PORCINE) IN NACL 1000-0.9 UT/500ML-% IV SOLN
INTRAVENOUS | Status: AC
  Filled 2021-01-28: qty 500

## 2021-01-28 MED ORDER — OXYCODONE HCL 5 MG PO TABS: 5.0000 mg | ORAL_TABLET | Freq: Once | ORAL | Status: DC | PRN

## 2021-01-28 MED ORDER — DIPHENHYDRAMINE HCL 50 MG/ML IJ SOLN
INTRAMUSCULAR | Status: DC | PRN
  Administered 2021-01-28: 12.5 mg via INTRAVENOUS

## 2021-01-28 MED ORDER — HEPARIN SODIUM (PORCINE) 1000 UNIT/ML IJ SOLN
INTRAMUSCULAR | Status: AC
  Filled 2021-01-28: qty 1

## 2021-01-28 MED ORDER — PROMETHAZINE HCL 25 MG/ML IJ SOLN: 6.2500 mg | INTRAMUSCULAR | Status: DC | PRN

## 2021-01-28 MED ORDER — SUGAMMADEX SODIUM 200 MG/2ML IV SOLN
INTRAVENOUS | Status: DC | PRN
  Administered 2021-01-28: 125 mg via INTRAVENOUS

## 2021-01-28 MED ORDER — HYDROMORPHONE HCL 1 MG/ML IJ SOLN: 0.2500 mg | INTRAMUSCULAR | Status: DC | PRN

## 2021-01-28 MED ORDER — ROCURONIUM BROMIDE 10 MG/ML (PF) SYRINGE
PREFILLED_SYRINGE | INTRAVENOUS | Status: DC | PRN
  Administered 2021-01-28: 60 mg via INTRAVENOUS

## 2021-01-28 MED ORDER — ONDANSETRON HCL 4 MG/2ML IJ SOLN
INTRAMUSCULAR | Status: DC | PRN
  Administered 2021-01-28: 4 mg via INTRAVENOUS

## 2021-01-28 MED ORDER — HEPARIN SODIUM (PORCINE) 1000 UNIT/ML IJ SOLN
INTRAMUSCULAR | Status: DC | PRN
  Administered 2021-01-28: 2000 [IU] via INTRAVENOUS
  Administered 2021-01-28: 1000 [IU] via INTRAVENOUS
  Administered 2021-01-28: 14000 [IU] via INTRAVENOUS
  Administered 2021-01-28: 2000 [IU] via INTRAVENOUS

## 2021-01-28 MED ORDER — PHENYLEPHRINE 40 MCG/ML (10ML) SYRINGE FOR IV PUSH (FOR BLOOD PRESSURE SUPPORT)
PREFILLED_SYRINGE | INTRAVENOUS | Status: DC | PRN
  Administered 2021-01-28: 120 ug via INTRAVENOUS

## 2021-01-28 SURGICAL SUPPLY — 19 items
BAG SNAP BAND KOVER 36X36 (MISCELLANEOUS) ×2 IMPLANT
CATH OCTARAY 2.0 F 3-3-3-3-3 (CATHETERS) ×2 IMPLANT
CATH S CIRCA THERM PROBE 10F (CATHETERS) ×2 IMPLANT
CATH SMTCH THERMOCOOL SF DF (CATHETERS) ×2 IMPLANT
CATH SOUNDSTAR ECO 8FR (CATHETERS) ×2 IMPLANT
CATH WEBSTER BI DIR CS D-F CRV (CATHETERS) ×2 IMPLANT
CLOSURE PERCLOSE PROSTYLE (VASCULAR PRODUCTS) ×10 IMPLANT
COVER SWIFTLINK CONNECTOR (BAG) ×2 IMPLANT
KIT VERSACROSS STEERABLE 180 (KITS) ×2 IMPLANT
PACK EP LATEX FREE (CUSTOM PROCEDURE TRAY) ×2
PACK EP LF (CUSTOM PROCEDURE TRAY) ×1 IMPLANT
PAD PRO RADIOLUCENT 2001M-C (PAD) ×2 IMPLANT
PATCH CARTO3 (PAD) ×2 IMPLANT
SHEATH CARTO VIZIGO SM CVD (SHEATH) ×2 IMPLANT
SHEATH PINNACLE 7F 10CM (SHEATH) ×2 IMPLANT
SHEATH PINNACLE 8F 10CM (SHEATH) ×4 IMPLANT
SHEATH PINNACLE 9F 10CM (SHEATH) ×2 IMPLANT
SHEATH PROBE COVER 6X72 (BAG) ×2 IMPLANT
TUBING SMART ABLATE COOLFLOW (TUBING) ×2 IMPLANT

## 2021-01-28 NOTE — Transfer of Care (Signed)
Immediate Anesthesia Transfer of Care Note  Patient: Angela Morales  Procedure(s) Performed: ATRIAL FIBRILLATION ABLATION  Patient Location: PACU  Anesthesia Type:General  Level of Consciousness: awake and alert   Airway & Oxygen Therapy: Patient Spontanous Breathing and Patient connected to nasal cannula oxygen  Post-op Assessment: Report given to RN and Post -op Vital signs reviewed and stable  Post vital signs: Reviewed and stable  Last Vitals:  Vitals Value Taken Time  BP 108/55 01/28/21 1246  Temp    Pulse 78 01/28/21 1248  Resp 19 01/28/21 1248  SpO2 99 % 01/28/21 1248  Vitals shown include unvalidated device data.  Last Pain:  Vitals:   01/28/21 0751  TempSrc:   PainSc: 2          Complications: There were no known notable events for this encounter.

## 2021-01-28 NOTE — Discharge Instructions (Addendum)
Post procedure care instructions No driving for 4 days. No lifting over 5 lbs for 1 week. No vigorous or sexual activity for 1 week. You may return to work/your usual activities on 02/05/21. Keep procedure site clean & dry. If you notice increased pain, swelling, bleeding or pus, call/return!  You may shower after 24 hours, but no soaking in baths/hot tubs/pools for 1 week.    You have an appointment set up with the [facility].  Multiple studies have shown that being followed by a dedicated atrial fibrillation clinic in addition to the standard care you receive from your other physicians improves health. We believe that enrollment in the atrial fibrillation clinic will allow [place] to better care for you.   The phone number to the [facility] is [phone]. The clinic is staffed Monday through Friday from 8:30am to 5pm.  Parking Directions: The clinic is located in the Heart and Vascular Building connected to [facility]. 1)From [address] turn on to [address] and go to the 3rd entrance  (Heart and Vascular entrance) on the right. 2)Look to the right for Heart &Vascular Parking Garage. 3)A code for the entrance is required, for August is 5544.   4)Take the elevators to the 1st floor. Registration is in the room with the glass walls at the end of the hallway.  If you have any trouble parking or locating the clinic, please don't hesitate to call [phone].   Cardiac Ablation, Care After  This sheet gives you information about how to care for yourself after your procedure. Your health care provider may also give you more specific instructions. If you have problems or questions, contact your health care provider. What can I expect after the procedure? After the procedure, it is common to have: Bruising around your puncture site. Tenderness around your puncture site. Skipped heartbeats. Tiredness (fatigue).  Follow these instructions at  home: Puncture site care  Follow instructions from your health care provider about how to take care of your puncture site. Make sure you: If present, leave stitches (sutures), skin glue, or adhesive strips in place. These skin closures may need to stay in place for up to 2 weeks. If adhesive strip edges start to loosen and curl up, you may trim the loose edges. Do not remove adhesive strips completely unless your health care provider tells you to do that. If a large square bandage is present, this may be removed 24 hours after surgery.  Check your puncture site every day for signs of infection. Check for: Redness, swelling, or pain. Fluid or blood. If your puncture site starts to bleed, lie down on your back, apply firm pressure to the area, and contact your health care provider. Warmth. Pus or a bad smell. Driving Do not drive for at least 4 days after your procedure or however long your health care provider recommends. (Do not resume driving if you have previously been instructed not to drive for other health reasons.) Do not drive or use heavy machinery while taking prescription pain medicine. Activity Avoid activities that take a lot of effort for at least 7 days after your procedure. Do not lift anything that is heavier than 5 lb (4.5 kg) for one week.  No sexual activity for 1 week.  Return to your normal activities as told by your health care provider. Ask your health care provider what activities are safe for you. General instructions Take over-the-counter and prescription medicines only as told by your health care  provider. Do not use any products that contain nicotine or tobacco, such as cigarettes and e-cigarettes. If you need help quitting, ask your health care provider. You may shower after 24 hours, but Do not take baths, swim, or use a hot tub for 1 week.  Do not drink alcohol for 24 hours after your procedure. Keep all follow-up visits as told by your health care provider. This  is important. Contact a health care provider if: You have redness, mild swelling, or pain around your puncture site. You have fluid or blood coming from your puncture site that stops after applying firm pressure to the area. Your puncture site feels warm to the touch. You have pus or a bad smell coming from your puncture site. You have a fever. You have chest pain or discomfort that spreads to your neck, jaw, or arm. You are sweating a lot. You feel nauseous. You have a fast or irregular heartbeat. You have shortness of breath. You are dizzy or light-headed and feel the need to lie down. You have pain or numbness in the arm or leg closest to your puncture site. Get help right away if: Your puncture site suddenly swells. Your puncture site is bleeding and the bleeding does not stop after applying firm pressure to the area. These symptoms may represent a serious problem that is an emergency. Do not wait to see if the symptoms will go away. Get medical help right away. Call your local emergency services (911 in the [place] Do not drive yourself to the hospital. Summary After the procedure, it is normal to have bruising and tenderness at the puncture site in your groin, neck, or forearm. Check your puncture site every day for signs of infection. Get help right away if your puncture site is bleeding and the bleeding does not stop after applying firm pressure to the area. This is a medical emergency. This information is not intended to replace advice given to you by your health care provider. Make sure you discuss any questions you have with your health care provider.

## 2021-01-28 NOTE — Anesthesia Procedure Notes (Signed)
Procedure Name: Intubation Date/Time: 01/28/2021 9:42 AM Performed by: Reece Agar, CRNA Pre-anesthesia Checklist: Patient identified, Emergency Drugs available, Suction available and Patient being monitored Patient Re-evaluated:Patient Re-evaluated prior to induction Oxygen Delivery Method: Circle System Utilized Preoxygenation: Pre-oxygenation with 100% oxygen Induction Type: IV induction Ventilation: Mask ventilation without difficulty Laryngoscope Size: Charlyne Petrin and 3 Grade View: Grade I Tube type: Oral Number of attempts: 1 Airway Equipment and Method: Stylet Placement Confirmation: ETT inserted through vocal cords under direct vision, positive ETCO2 and breath sounds checked- equal and bilateral Secured at: 22 cm Tube secured with: Tape Dental Injury: Teeth and Oropharynx as per pre-operative assessment

## 2021-01-28 NOTE — H&P (Signed)
Electrophysiology Office Note   Date:  01/28/2021   ID:  Angela Morales, Angela Morales [birth year], MRN [id number]  PCP:  Deland Pretty, MD  Cardiologist:  [facility] Primary Electrophysiologist:  Laportia Carley Meredith Leeds, MD    Chief Complaint: AF   History of Present Illness: Angela Morales is a 74 y.o. female who is being seen today for the evaluation of AF at the request of No ref. provider found. Presenting today for electrophysiology evaluation.  She has a history significant for celiac disease, glaucoma, and atrial fibrillation.  She was diagnosed with atrial relation in 2018 in the setting of a GI illness that was later diagnosed with celiac disease.  She developed persistent atrial fibrillation and underwent cardioversion following 522 which was unfortunately unsuccessful.  Today, denies symptoms of palpitations, chest pain, shortness of breath, orthopnea, PND, lower extremity edema, claudication, dizziness, presyncope, syncope, bleeding, or neurologic sequela. The patient is tolerating medications without difficulties. Plan for ablation today.    Past Medical History:  Diagnosis Date   Abnormal uterine bleeding    on HRT   Allergy    seasonal   Arthritis    Atrial fibrillation (HCC)    Celiac disease    DDD (degenerative disc disease)    Dysrhythmia    Glaucoma    Microscopic colitis    Osteoarthritis of both knees    PONV (postoperative nausea and vomiting)    SVT (supraventricular tachycardia) ([place])    Past Surgical History:  Procedure Laterality Date   APPENDECTOMY     CARDIOVERSION N/A 10/30/2020   Procedure: CARDIOVERSION;  Surgeon: Jerline Pain, MD;  Location: [place];  Service: Cardiovascular;  Laterality: N/A;   COLONOSCOPY     FOOT FRACTURE SURGERY  10/2013   TONSILLECTOMY AND ADENOIDECTOMY     TOTAL KNEE ARTHROPLASTY Left 03/22/2016   Procedure: TOTAL KNEE ARTHROPLASTY;  Surgeon: Dorna Leitz, MD;  Location: [place];  Service: Orthopedics;  Laterality: Left;    TUBAL LIGATION  1981   VAGINAL HYSTERECTOMY  04/20/05   prolapse, adenomyosis     No current facility-administered medications for this encounter.    Allergies:   Gluten meal and Cefdinir   Social History:  The patient  reports that she has never smoked. She has never used smokeless tobacco. She reports current alcohol use of about 4.0 - 6.0 standard drinks of alcohol per week. She reports that she does not use drugs.   Family History:  The patient's family history includes Bipolar disorder in her son; Breast cancer in her cousin; Heart disease (age of onset: 55) in her brother; Liver disease in her son; Osteoarthritis in her maternal grandmother; Osteoporosis in her mother; Prostate cancer in her father; Stroke in her mother.   ROS:  Please see the history of present illness.   Otherwise, review of systems is positive for none.   All other systems are reviewed and negative.   PHYSICAL EXAM: VS:  BP 118/77   Pulse 87   Temp 97.8 F (36.6 C) (Oral)   Ht 5' 4"  (1.626 m)   Wt 63.5 kg   LMP 04/20/2005   SpO2 96%   BMI 24.03 kg/m  , BMI Body mass index is 24.03 kg/m. GEN: Well nourished, well developed, in no acute distress  HEENT: normal  Neck: no JVD, carotid bruits, or masses Cardiac: RRR; no murmurs, rubs, or gallops,no edema  Respiratory:  clear to auscultation bilaterally, normal work of breathing GI: soft, nontender, nondistended, + BS MS: no  deformity or atrophy  Skin: warm and dry Neuro:  Strength and sensation are intact Psych: euthymic mood, full affect   Recent Labs: 08/13/2020: Magnesium 2.2; TSH 1.760 01/22/2021: BUN 8; Creatinine, Ser 0.73; Hemoglobin 16.0; Platelets 210; Potassium 4.6; Sodium 137    Lipid Panel  No results found for: CHOL, TRIG, HDL, CHOLHDL, VLDL, LDLCALC, LDLDIRECT   Wt Readings from Last 3 Encounters:  01/28/21 63.5 kg  01/16/21 63.7 kg  12/08/20 63.5 kg      Other studies Reviewed: Additional studies/ records that were reviewed  today include: TTE 12/05/20  Review of the above records today demonstrates:   1. Left ventricular ejection fraction, by estimation, is 55%. The left  ventricle has normal function. The left ventricle has no regional wall  motion abnormalities. Left ventricular diastolic parameters were grossly  normal.   2. Right ventricular systolic function is normal. The right ventricular  size is mildly enlarged. There is normal pulmonary artery systolic  pressure. The estimated right ventricular systolic pressure is 93.1 mmHg.   3. Left atrial size was moderately dilated.   4. Right atrial size was mild to moderately dilated.   5. The mitral valve is normal in structure. Mild mitral valve  regurgitation. No evidence of mitral stenosis.   6. Tricuspid valve regurgitation is mild to moderate.   7. The aortic valve is grossly normal. There is mild calcification of the  aortic valve. Aortic valve regurgitation is not visualized. No aortic  stenosis is present.   8. The inferior vena cava is normal in size with greater than 50%  respiratory variability, suggesting right atrial pressure of 3 mmHg.    ASSESSMENT AND PLAN:  1.  Persistent atrial fibrillation/flutter:  Angela Morales has presented today for surgery, with the diagnosis of atrial fibrillation.  The various methods of treatment have been discussed with the patient and family. After consideration of risks, benefits and other options for treatment, the patient has consented to  Procedure(s): Catheter ablation as a surgical intervention .  Risks include but not limited to complete heart block, stroke, esophageal damage, nerve damage, bleeding, vascular damage, tamponade, perforation, MI, and death. The patient's history has been reviewed, patient examined, no change in status, stable for surgery.  I have reviewed the patient's chart and labs.  Questions were answered to the patient's satisfaction.    Kashten Gowin Curt Bears, MD 01/28/2021 7:35 AM

## 2021-01-28 NOTE — Anesthesia Preprocedure Evaluation (Signed)
Anesthesia Evaluation  Patient identified by MRN, date of birth, ID band Patient awake    Reviewed: Allergy & Precautions, NPO status , Patient's Chart, lab work & pertinent test results  History of Anesthesia Complications (+) PONV  Airway Mallampati: II  TM Distance: >3 FB Neck ROM: Full    Dental no notable dental hx.    Pulmonary neg pulmonary ROS,    Pulmonary exam normal breath sounds clear to auscultation       Cardiovascular Normal cardiovascular exam+ dysrhythmias Atrial Fibrillation  Rhythm:Regular Rate:Normal     Neuro/Psych negative neurological ROS  negative psych ROS   GI/Hepatic negative GI ROS, Neg liver ROS,   Endo/Other  negative endocrine ROS  Renal/GU negative Renal ROS  negative genitourinary   Musculoskeletal  (+) Arthritis , Osteoarthritis,    Abdominal   Peds negative pediatric ROS (+)  Hematology negative hematology ROS (+)   Anesthesia Other Findings   Reproductive/Obstetrics negative OB ROS                             Anesthesia Physical Anesthesia Plan  ASA: 3  Anesthesia Plan: General   Post-op Pain Management:    Induction: Intravenous  PONV Risk Score and Plan: 4 or greater and Ondansetron, Dexamethasone, Midazolam, Droperidol and Treatment may vary due to age or medical condition  Airway Management Planned: Oral ETT  Additional Equipment:   Intra-op Plan:   Post-operative Plan: Extubation in OR  Informed Consent: I have reviewed the patients History and Physical, chart, labs and discussed the procedure including the risks, benefits and alternatives for the proposed anesthesia with the patient or authorized representative who has indicated his/her understanding and acceptance.     Dental advisory given  Plan Discussed with: CRNA  Anesthesia Plan Comments:         Anesthesia Quick Evaluation

## 2021-01-29 NOTE — Anesthesia Postprocedure Evaluation (Signed)
Anesthesia Post Note  Patient: Angela Morales  Procedure(s) Performed: ATRIAL FIBRILLATION ABLATION     Patient location during evaluation: PACU Anesthesia Type: General Level of consciousness: awake and alert Pain management: pain level controlled Vital Signs Assessment: post-procedure vital signs reviewed and stable Respiratory status: spontaneous breathing, nonlabored ventilation and respiratory function stable Cardiovascular status: blood pressure returned to baseline and stable Postop Assessment: no apparent nausea or vomiting Anesthetic complications: no   There were no known notable events for this encounter.  Last Vitals:  Vitals:   01/28/21 1600 01/28/21 1700  BP: 120/65 130/66  Pulse: 60 80  Resp: 19 (!) 9  Temp:    SpO2: 99% 96%    Last Pain:  Vitals:   01/28/21 1700  TempSrc:   PainSc: 0-No pain   Pain Goal:                   Lynda Rainwater

## 2021-02-05 ENCOUNTER — Inpatient Hospital Stay (HOSPITAL_COMMUNITY)
Admission: EM | Admit: 2021-02-05 | Discharge: 2021-03-04 | DRG: 004 | Disposition: A | Discharge status: Rehabilitation Facility | Payer: Medicare Other | Attending: Internal Medicine | Admitting: Internal Medicine

## 2021-02-05 ENCOUNTER — Emergency Department (HOSPITAL_COMMUNITY): Payer: Medicare Other

## 2021-02-05 DIAGNOSIS — Z8042 Family history of malignant neoplasm of prostate: Secondary | ICD-10-CM | POA: Diagnosis not present

## 2021-02-05 DIAGNOSIS — E222 Syndrome of inappropriate secretion of antidiuretic hormone: Secondary | ICD-10-CM | POA: Diagnosis not present

## 2021-02-05 DIAGNOSIS — Z743 Need for continuous supervision: Secondary | ICD-10-CM | POA: Diagnosis not present

## 2021-02-05 DIAGNOSIS — M47816 Spondylosis without myelopathy or radiculopathy, lumbar region: Secondary | ICD-10-CM | POA: Diagnosis present

## 2021-02-05 DIAGNOSIS — Z930 Tracheostomy status: Secondary | ICD-10-CM

## 2021-02-05 DIAGNOSIS — E878 Other disorders of electrolyte and fluid balance, not elsewhere classified: Secondary | ICD-10-CM | POA: Diagnosis present

## 2021-02-05 DIAGNOSIS — J90 Pleural effusion, not elsewhere classified: Secondary | ICD-10-CM | POA: Diagnosis not present

## 2021-02-05 DIAGNOSIS — J99 Respiratory disorders in diseases classified elsewhere: Secondary | ICD-10-CM | POA: Diagnosis not present

## 2021-02-05 DIAGNOSIS — R6521 Severe sepsis with septic shock: Secondary | ICD-10-CM | POA: Diagnosis not present

## 2021-02-05 DIAGNOSIS — M1711 Unilateral primary osteoarthritis, right knee: Secondary | ICD-10-CM | POA: Diagnosis not present

## 2021-02-05 DIAGNOSIS — Z9889 Other specified postprocedural states: Secondary | ICD-10-CM | POA: Diagnosis not present

## 2021-02-05 DIAGNOSIS — R200 Anesthesia of skin: Secondary | ICD-10-CM | POA: Diagnosis not present

## 2021-02-05 DIAGNOSIS — R531 Weakness: Secondary | ICD-10-CM | POA: Diagnosis not present

## 2021-02-05 DIAGNOSIS — M419 Scoliosis, unspecified: Secondary | ICD-10-CM | POA: Diagnosis not present

## 2021-02-05 DIAGNOSIS — A419 Sepsis, unspecified organism: Secondary | ICD-10-CM | POA: Diagnosis not present

## 2021-02-05 DIAGNOSIS — J690 Pneumonitis due to inhalation of food and vomit: Secondary | ICD-10-CM | POA: Diagnosis not present

## 2021-02-05 DIAGNOSIS — I119 Hypertensive heart disease without heart failure: Secondary | ICD-10-CM | POA: Diagnosis not present

## 2021-02-05 DIAGNOSIS — G709 Myoneural disorder, unspecified: Secondary | ICD-10-CM | POA: Diagnosis present

## 2021-02-05 DIAGNOSIS — F5101 Primary insomnia: Secondary | ICD-10-CM | POA: Diagnosis not present

## 2021-02-05 DIAGNOSIS — Z7901 Long term (current) use of anticoagulants: Secondary | ICD-10-CM | POA: Diagnosis not present

## 2021-02-05 DIAGNOSIS — Z20822 Contact with and (suspected) exposure to covid-19: Secondary | ICD-10-CM | POA: Diagnosis not present

## 2021-02-05 DIAGNOSIS — J939 Pneumothorax, unspecified: Secondary | ICD-10-CM

## 2021-02-05 DIAGNOSIS — Z6823 Body mass index (BMI) 23.0-23.9, adult: Secondary | ICD-10-CM

## 2021-02-05 DIAGNOSIS — G610 Guillain-Barre syndrome: Principal | ICD-10-CM | POA: Diagnosis present

## 2021-02-05 DIAGNOSIS — Z8262 Family history of osteoporosis: Secondary | ICD-10-CM | POA: Diagnosis not present

## 2021-02-05 DIAGNOSIS — I959 Hypotension, unspecified: Secondary | ICD-10-CM | POA: Diagnosis not present

## 2021-02-05 DIAGNOSIS — D72829 Elevated white blood cell count, unspecified: Secondary | ICD-10-CM

## 2021-02-05 DIAGNOSIS — D751 Secondary polycythemia: Secondary | ICD-10-CM | POA: Diagnosis present

## 2021-02-05 DIAGNOSIS — R935 Abnormal findings on diagnostic imaging of other abdominal regions, including retroperitoneum: Secondary | ICD-10-CM | POA: Diagnosis not present

## 2021-02-05 DIAGNOSIS — M4802 Spinal stenosis, cervical region: Secondary | ICD-10-CM | POA: Diagnosis present

## 2021-02-05 DIAGNOSIS — I701 Atherosclerosis of renal artery: Secondary | ICD-10-CM | POA: Diagnosis not present

## 2021-02-05 DIAGNOSIS — H409 Unspecified glaucoma: Secondary | ICD-10-CM | POA: Diagnosis present

## 2021-02-05 DIAGNOSIS — J9601 Acute respiratory failure with hypoxia: Secondary | ICD-10-CM | POA: Diagnosis not present

## 2021-02-05 DIAGNOSIS — Z01818 Encounter for other preprocedural examination: Secondary | ICD-10-CM

## 2021-02-05 DIAGNOSIS — M21372 Foot drop, left foot: Secondary | ICD-10-CM | POA: Diagnosis present

## 2021-02-05 DIAGNOSIS — Z96652 Presence of left artificial knee joint: Secondary | ICD-10-CM | POA: Diagnosis not present

## 2021-02-05 DIAGNOSIS — I2510 Atherosclerotic heart disease of native coronary artery without angina pectoris: Secondary | ICD-10-CM | POA: Diagnosis not present

## 2021-02-05 DIAGNOSIS — E876 Hypokalemia: Secondary | ICD-10-CM | POA: Diagnosis not present

## 2021-02-05 DIAGNOSIS — M549 Dorsalgia, unspecified: Secondary | ICD-10-CM | POA: Diagnosis not present

## 2021-02-05 DIAGNOSIS — I4892 Unspecified atrial flutter: Secondary | ICD-10-CM | POA: Diagnosis present

## 2021-02-05 DIAGNOSIS — I672 Cerebral atherosclerosis: Secondary | ICD-10-CM | POA: Diagnosis not present

## 2021-02-05 DIAGNOSIS — G4700 Insomnia, unspecified: Secondary | ICD-10-CM | POA: Diagnosis not present

## 2021-02-05 DIAGNOSIS — I774 Celiac artery compression syndrome: Secondary | ICD-10-CM | POA: Diagnosis not present

## 2021-02-05 DIAGNOSIS — Z90710 Acquired absence of both cervix and uterus: Secondary | ICD-10-CM

## 2021-02-05 DIAGNOSIS — I4891 Unspecified atrial fibrillation: Secondary | ICD-10-CM | POA: Diagnosis present

## 2021-02-05 DIAGNOSIS — Z91018 Allergy to other foods: Secondary | ICD-10-CM

## 2021-02-05 DIAGNOSIS — G479 Sleep disorder, unspecified: Secondary | ICD-10-CM | POA: Diagnosis not present

## 2021-02-05 DIAGNOSIS — R339 Retention of urine, unspecified: Secondary | ICD-10-CM | POA: Diagnosis not present

## 2021-02-05 DIAGNOSIS — R1310 Dysphagia, unspecified: Secondary | ICD-10-CM | POA: Diagnosis not present

## 2021-02-05 DIAGNOSIS — J9690 Respiratory failure, unspecified, unspecified whether with hypoxia or hypercapnia: Secondary | ICD-10-CM | POA: Diagnosis not present

## 2021-02-05 DIAGNOSIS — H02402 Unspecified ptosis of left eyelid: Secondary | ICD-10-CM | POA: Diagnosis not present

## 2021-02-05 DIAGNOSIS — Z8249 Family history of ischemic heart disease and other diseases of the circulatory system: Secondary | ICD-10-CM | POA: Diagnosis not present

## 2021-02-05 DIAGNOSIS — J95851 Ventilator associated pneumonia: Secondary | ICD-10-CM | POA: Diagnosis not present

## 2021-02-05 DIAGNOSIS — Y848 Other medical procedures as the cause of abnormal reaction of the patient, or of later complication, without mention of misadventure at the time of the procedure: Secondary | ICD-10-CM | POA: Diagnosis not present

## 2021-02-05 DIAGNOSIS — K828 Other specified diseases of gallbladder: Secondary | ICD-10-CM | POA: Diagnosis not present

## 2021-02-05 DIAGNOSIS — J9811 Atelectasis: Secondary | ICD-10-CM | POA: Diagnosis not present

## 2021-02-05 DIAGNOSIS — G894 Chronic pain syndrome: Secondary | ICD-10-CM | POA: Diagnosis not present

## 2021-02-05 DIAGNOSIS — R0602 Shortness of breath: Secondary | ICD-10-CM | POA: Diagnosis not present

## 2021-02-05 DIAGNOSIS — G1229 Other motor neuron disease: Secondary | ICD-10-CM | POA: Diagnosis not present

## 2021-02-05 DIAGNOSIS — N319 Neuromuscular dysfunction of bladder, unspecified: Secondary | ICD-10-CM | POA: Diagnosis not present

## 2021-02-05 DIAGNOSIS — E871 Hypo-osmolality and hyponatremia: Secondary | ICD-10-CM | POA: Diagnosis not present

## 2021-02-05 DIAGNOSIS — R1312 Dysphagia, oropharyngeal phase: Secondary | ICD-10-CM | POA: Diagnosis not present

## 2021-02-05 DIAGNOSIS — J189 Pneumonia, unspecified organism: Secondary | ICD-10-CM

## 2021-02-05 DIAGNOSIS — E440 Moderate protein-calorie malnutrition: Secondary | ICD-10-CM | POA: Insufficient documentation

## 2021-02-05 DIAGNOSIS — J15211 Pneumonia due to Methicillin susceptible Staphylococcus aureus: Secondary | ICD-10-CM | POA: Diagnosis not present

## 2021-02-05 DIAGNOSIS — Z823 Family history of stroke: Secondary | ICD-10-CM | POA: Diagnosis not present

## 2021-02-05 DIAGNOSIS — Z79899 Other long term (current) drug therapy: Secondary | ICD-10-CM

## 2021-02-05 DIAGNOSIS — R6889 Other general symptoms and signs: Secondary | ICD-10-CM | POA: Diagnosis not present

## 2021-02-05 DIAGNOSIS — I480 Paroxysmal atrial fibrillation: Secondary | ICD-10-CM | POA: Diagnosis present

## 2021-02-05 DIAGNOSIS — M4319 Spondylolisthesis, multiple sites in spine: Secondary | ICD-10-CM | POA: Diagnosis not present

## 2021-02-05 DIAGNOSIS — Z4682 Encounter for fitting and adjustment of non-vascular catheter: Secondary | ICD-10-CM | POA: Diagnosis not present

## 2021-02-05 DIAGNOSIS — R079 Chest pain, unspecified: Secondary | ICD-10-CM | POA: Diagnosis not present

## 2021-02-05 DIAGNOSIS — G931 Anoxic brain damage, not elsewhere classified: Secondary | ICD-10-CM | POA: Diagnosis not present

## 2021-02-05 DIAGNOSIS — R9431 Abnormal electrocardiogram [ECG] [EKG]: Secondary | ICD-10-CM

## 2021-02-05 DIAGNOSIS — M170 Bilateral primary osteoarthritis of knee: Secondary | ICD-10-CM | POA: Diagnosis present

## 2021-02-05 DIAGNOSIS — E8770 Fluid overload, unspecified: Secondary | ICD-10-CM | POA: Diagnosis not present

## 2021-02-05 DIAGNOSIS — M21371 Foot drop, right foot: Secondary | ICD-10-CM | POA: Diagnosis present

## 2021-02-05 DIAGNOSIS — J9600 Acute respiratory failure, unspecified whether with hypoxia or hypercapnia: Secondary | ICD-10-CM

## 2021-02-05 DIAGNOSIS — Z4659 Encounter for fitting and adjustment of other gastrointestinal appliance and device: Secondary | ICD-10-CM

## 2021-02-05 DIAGNOSIS — E46 Unspecified protein-calorie malnutrition: Secondary | ICD-10-CM | POA: Diagnosis not present

## 2021-02-05 DIAGNOSIS — F419 Anxiety disorder, unspecified: Secondary | ICD-10-CM | POA: Diagnosis present

## 2021-02-05 DIAGNOSIS — Z881 Allergy status to other antibiotic agents status: Secondary | ICD-10-CM

## 2021-02-05 DIAGNOSIS — M48061 Spinal stenosis, lumbar region without neurogenic claudication: Secondary | ICD-10-CM | POA: Diagnosis present

## 2021-02-05 DIAGNOSIS — R29810 Facial weakness: Secondary | ICD-10-CM | POA: Diagnosis not present

## 2021-02-05 LAB — CBC WITH DIFFERENTIAL/PLATELET
Abs Immature Granulocytes: 0.04 10*3/uL (ref 0.00–0.07)
Basophils Absolute: 0 10*3/uL (ref 0.0–0.1)
Basophils Relative: 0 %
Eosinophils Absolute: 0 10*3/uL (ref 0.0–0.5)
Eosinophils Relative: 0 %
HCT: 46.1 % — ABNORMAL HIGH (ref 36.0–46.0)
Hemoglobin: 15.5 g/dL — ABNORMAL HIGH (ref 12.0–15.0)
Immature Granulocytes: 0 %
Lymphocytes Relative: 14 %
Lymphs Abs: 1.5 10*3/uL (ref 0.7–4.0)
MCH: 33.2 pg (ref 26.0–34.0)
MCHC: 33.6 g/dL (ref 30.0–36.0)
MCV: 98.7 fL (ref 80.0–100.0)
Monocytes Absolute: 0.5 10*3/uL (ref 0.1–1.0)
Monocytes Relative: 5 %
Neutro Abs: 8.3 10*3/uL — ABNORMAL HIGH (ref 1.7–7.7)
Neutrophils Relative %: 81 %
Platelets: 282 10*3/uL (ref 150–400)
RBC: 4.67 MIL/uL (ref 3.87–5.11)
RDW: 12.4 % (ref 11.5–15.5)
WBC: 10.3 10*3/uL (ref 4.0–10.5)
nRBC: 0 % (ref 0.0–0.2)

## 2021-02-05 NOTE — ED Provider Notes (Signed)
Emergency Medicine Provider Triage Evaluation Note  Angela Morales , a 75 y.o. female  was evaluated in triage.  Pt complains of chest pain, numbness.  She states that the symptoms started 3 days ago.  She states that the numbness sensation is progressing.  It is working proximally.  She states that she had a cardiac ablation on 8/3.  Review of Systems  Positive: Chest pain, numbness Negative: Fever, chills  Physical Exam  LMP 04/20/2005  Gen:   Awake, no distress   Resp:  Normal effort  MSK:   Moves extremities without difficulty  Other:  Subjective numbness in hands and feet  Medical Decision Making  Medically screening exam initiated at 10:51 PM.  Appropriate orders placed.  SALAYA HOLTROP was informed that the remainder of the evaluation will be completed by another provider, this initial triage assessment does not replace that evaluation, and the importance of remaining in the ED until their evaluation is complete.  Patient with chest pain and numbness in extremities that started 3 days ago.  Had recent ablation.  Significantly elevated blood pressure.  CT dissection study ordered.  Patient will need next room   Montine Circle, Hershal Coria 02/05/21 2253    Noemi Chapel, MD 02/07/21 1213

## 2021-02-05 NOTE — ED Triage Notes (Signed)
Pt c/o generalized weakness & numbness in all 4 extremities, no unilateral deficits, going on since Tuesday. Endorses HA, nausea. 20RAC, 107m zofran given en route. Cardiac ablation 8/3 for chronic afib  Hx afib, scoliosis, chronic back pain  VSS

## 2021-02-05 NOTE — ED Notes (Signed)
Pt advises that numbness is worse in hands & feet than arms.

## 2021-02-06 ENCOUNTER — Inpatient Hospital Stay (HOSPITAL_COMMUNITY): Payer: Medicare Other

## 2021-02-06 ENCOUNTER — Emergency Department (HOSPITAL_COMMUNITY): Payer: Medicare Other

## 2021-02-06 ENCOUNTER — Encounter (HOSPITAL_COMMUNITY): Payer: Self-pay | Admitting: Family Medicine

## 2021-02-06 ENCOUNTER — Telehealth: Payer: Self-pay | Admitting: Cardiology

## 2021-02-06 ENCOUNTER — Inpatient Hospital Stay: Payer: Self-pay

## 2021-02-06 DIAGNOSIS — J9601 Acute respiratory failure with hypoxia: Secondary | ICD-10-CM | POA: Diagnosis not present

## 2021-02-06 DIAGNOSIS — I480 Paroxysmal atrial fibrillation: Secondary | ICD-10-CM | POA: Diagnosis present

## 2021-02-06 DIAGNOSIS — Z823 Family history of stroke: Secondary | ICD-10-CM | POA: Diagnosis not present

## 2021-02-06 DIAGNOSIS — R0602 Shortness of breath: Secondary | ICD-10-CM | POA: Diagnosis not present

## 2021-02-06 DIAGNOSIS — M1711 Unilateral primary osteoarthritis, right knee: Secondary | ICD-10-CM | POA: Diagnosis not present

## 2021-02-06 DIAGNOSIS — E871 Hypo-osmolality and hyponatremia: Secondary | ICD-10-CM | POA: Diagnosis not present

## 2021-02-06 DIAGNOSIS — J99 Respiratory disorders in diseases classified elsewhere: Secondary | ICD-10-CM | POA: Diagnosis not present

## 2021-02-06 DIAGNOSIS — N319 Neuromuscular dysfunction of bladder, unspecified: Secondary | ICD-10-CM | POA: Diagnosis not present

## 2021-02-06 DIAGNOSIS — Z8262 Family history of osteoporosis: Secondary | ICD-10-CM | POA: Diagnosis not present

## 2021-02-06 DIAGNOSIS — E222 Syndrome of inappropriate secretion of antidiuretic hormone: Secondary | ICD-10-CM | POA: Diagnosis present

## 2021-02-06 DIAGNOSIS — R935 Abnormal findings on diagnostic imaging of other abdominal regions, including retroperitoneum: Secondary | ICD-10-CM | POA: Diagnosis not present

## 2021-02-06 DIAGNOSIS — R6521 Severe sepsis with septic shock: Secondary | ICD-10-CM | POA: Diagnosis not present

## 2021-02-06 DIAGNOSIS — I672 Cerebral atherosclerosis: Secondary | ICD-10-CM | POA: Diagnosis not present

## 2021-02-06 DIAGNOSIS — A419 Sepsis, unspecified organism: Secondary | ICD-10-CM | POA: Diagnosis not present

## 2021-02-06 DIAGNOSIS — M4802 Spinal stenosis, cervical region: Secondary | ICD-10-CM | POA: Diagnosis not present

## 2021-02-06 DIAGNOSIS — J90 Pleural effusion, not elsewhere classified: Secondary | ICD-10-CM | POA: Diagnosis not present

## 2021-02-06 DIAGNOSIS — G709 Myoneural disorder, unspecified: Secondary | ICD-10-CM | POA: Diagnosis not present

## 2021-02-06 DIAGNOSIS — I701 Atherosclerosis of renal artery: Secondary | ICD-10-CM | POA: Diagnosis not present

## 2021-02-06 DIAGNOSIS — R339 Retention of urine, unspecified: Secondary | ICD-10-CM | POA: Diagnosis not present

## 2021-02-06 DIAGNOSIS — M4319 Spondylolisthesis, multiple sites in spine: Secondary | ICD-10-CM | POA: Diagnosis not present

## 2021-02-06 DIAGNOSIS — F5101 Primary insomnia: Secondary | ICD-10-CM | POA: Diagnosis not present

## 2021-02-06 DIAGNOSIS — Z8042 Family history of malignant neoplasm of prostate: Secondary | ICD-10-CM | POA: Diagnosis not present

## 2021-02-06 DIAGNOSIS — J9811 Atelectasis: Secondary | ICD-10-CM | POA: Diagnosis not present

## 2021-02-06 DIAGNOSIS — G4700 Insomnia, unspecified: Secondary | ICD-10-CM | POA: Diagnosis present

## 2021-02-06 DIAGNOSIS — G1229 Other motor neuron disease: Secondary | ICD-10-CM | POA: Diagnosis not present

## 2021-02-06 DIAGNOSIS — R200 Anesthesia of skin: Secondary | ICD-10-CM | POA: Diagnosis not present

## 2021-02-06 DIAGNOSIS — I4892 Unspecified atrial flutter: Secondary | ICD-10-CM | POA: Diagnosis not present

## 2021-02-06 DIAGNOSIS — J690 Pneumonitis due to inhalation of food and vomit: Secondary | ICD-10-CM | POA: Diagnosis not present

## 2021-02-06 DIAGNOSIS — Z930 Tracheostomy status: Secondary | ICD-10-CM | POA: Diagnosis not present

## 2021-02-06 DIAGNOSIS — E46 Unspecified protein-calorie malnutrition: Secondary | ICD-10-CM | POA: Diagnosis not present

## 2021-02-06 DIAGNOSIS — R9431 Abnormal electrocardiogram [ECG] [EKG]: Secondary | ICD-10-CM

## 2021-02-06 DIAGNOSIS — Z4682 Encounter for fitting and adjustment of non-vascular catheter: Secondary | ICD-10-CM | POA: Diagnosis not present

## 2021-02-06 DIAGNOSIS — I119 Hypertensive heart disease without heart failure: Secondary | ICD-10-CM | POA: Diagnosis present

## 2021-02-06 DIAGNOSIS — R531 Weakness: Secondary | ICD-10-CM

## 2021-02-06 DIAGNOSIS — Z96652 Presence of left artificial knee joint: Secondary | ICD-10-CM | POA: Diagnosis not present

## 2021-02-06 DIAGNOSIS — H409 Unspecified glaucoma: Secondary | ICD-10-CM | POA: Diagnosis present

## 2021-02-06 DIAGNOSIS — G610 Guillain-Barre syndrome: Secondary | ICD-10-CM | POA: Diagnosis not present

## 2021-02-06 DIAGNOSIS — J15211 Pneumonia due to Methicillin susceptible Staphylococcus aureus: Secondary | ICD-10-CM | POA: Diagnosis not present

## 2021-02-06 DIAGNOSIS — Z7901 Long term (current) use of anticoagulants: Secondary | ICD-10-CM

## 2021-02-06 DIAGNOSIS — I959 Hypotension, unspecified: Secondary | ICD-10-CM | POA: Diagnosis not present

## 2021-02-06 DIAGNOSIS — J9690 Respiratory failure, unspecified, unspecified whether with hypoxia or hypercapnia: Secondary | ICD-10-CM | POA: Diagnosis not present

## 2021-02-06 DIAGNOSIS — Z8249 Family history of ischemic heart disease and other diseases of the circulatory system: Secondary | ICD-10-CM | POA: Diagnosis not present

## 2021-02-06 DIAGNOSIS — J95851 Ventilator associated pneumonia: Secondary | ICD-10-CM | POA: Diagnosis not present

## 2021-02-06 DIAGNOSIS — I2510 Atherosclerotic heart disease of native coronary artery without angina pectoris: Secondary | ICD-10-CM | POA: Diagnosis not present

## 2021-02-06 DIAGNOSIS — G931 Anoxic brain damage, not elsewhere classified: Secondary | ICD-10-CM | POA: Diagnosis not present

## 2021-02-06 DIAGNOSIS — E8770 Fluid overload, unspecified: Secondary | ICD-10-CM | POA: Diagnosis not present

## 2021-02-06 DIAGNOSIS — H02402 Unspecified ptosis of left eyelid: Secondary | ICD-10-CM | POA: Diagnosis not present

## 2021-02-06 DIAGNOSIS — M419 Scoliosis, unspecified: Secondary | ICD-10-CM | POA: Diagnosis not present

## 2021-02-06 DIAGNOSIS — K828 Other specified diseases of gallbladder: Secondary | ICD-10-CM | POA: Diagnosis not present

## 2021-02-06 DIAGNOSIS — I4891 Unspecified atrial fibrillation: Secondary | ICD-10-CM | POA: Diagnosis not present

## 2021-02-06 DIAGNOSIS — Z9889 Other specified postprocedural states: Secondary | ICD-10-CM | POA: Diagnosis not present

## 2021-02-06 DIAGNOSIS — Y848 Other medical procedures as the cause of abnormal reaction of the patient, or of later complication, without mention of misadventure at the time of the procedure: Secondary | ICD-10-CM | POA: Diagnosis not present

## 2021-02-06 DIAGNOSIS — I774 Celiac artery compression syndrome: Secondary | ICD-10-CM | POA: Diagnosis not present

## 2021-02-06 DIAGNOSIS — F419 Anxiety disorder, unspecified: Secondary | ICD-10-CM | POA: Diagnosis present

## 2021-02-06 DIAGNOSIS — E440 Moderate protein-calorie malnutrition: Secondary | ICD-10-CM | POA: Diagnosis present

## 2021-02-06 DIAGNOSIS — Z20822 Contact with and (suspected) exposure to covid-19: Secondary | ICD-10-CM | POA: Diagnosis present

## 2021-02-06 DIAGNOSIS — R1310 Dysphagia, unspecified: Secondary | ICD-10-CM | POA: Diagnosis not present

## 2021-02-06 DIAGNOSIS — E876 Hypokalemia: Secondary | ICD-10-CM | POA: Diagnosis present

## 2021-02-06 DIAGNOSIS — G894 Chronic pain syndrome: Secondary | ICD-10-CM | POA: Diagnosis not present

## 2021-02-06 DIAGNOSIS — G479 Sleep disorder, unspecified: Secondary | ICD-10-CM | POA: Diagnosis not present

## 2021-02-06 DIAGNOSIS — R1312 Dysphagia, oropharyngeal phase: Secondary | ICD-10-CM | POA: Diagnosis not present

## 2021-02-06 DIAGNOSIS — M549 Dorsalgia, unspecified: Secondary | ICD-10-CM | POA: Diagnosis not present

## 2021-02-06 LAB — CBC
HCT: 42.8 % (ref 36.0–46.0)
Hemoglobin: 14.5 g/dL (ref 12.0–15.0)
MCH: 33.3 pg (ref 26.0–34.0)
MCHC: 33.9 g/dL (ref 30.0–36.0)
MCV: 98.4 fL (ref 80.0–100.0)
Platelets: 263 10*3/uL (ref 150–400)
RBC: 4.35 MIL/uL (ref 3.87–5.11)
RDW: 12.3 % (ref 11.5–15.5)
WBC: 11.8 10*3/uL — ABNORMAL HIGH (ref 4.0–10.5)
nRBC: 0 % (ref 0.0–0.2)

## 2021-02-06 LAB — BASIC METABOLIC PANEL
Anion gap: 9 (ref 5–15)
Anion gap: 10 (ref 5–15)
BUN: 5 mg/dL — ABNORMAL LOW (ref 8–23)
BUN: 6 mg/dL — ABNORMAL LOW (ref 8–23)
CO2: 24 mmol/L (ref 22–32)
CO2: 23 mmol/L (ref 22–32)
Calcium: 9.4 mg/dL (ref 8.9–10.3)
Calcium: 8.8 mg/dL — ABNORMAL LOW (ref 8.9–10.3)
Chloride: 96 mmol/L — ABNORMAL LOW (ref 98–111)
Chloride: 96 mmol/L — ABNORMAL LOW (ref 98–111)
Creatinine, Ser: 0.46 mg/dL (ref 0.44–1.00)
Creatinine, Ser: 0.41 mg/dL — ABNORMAL LOW (ref 0.44–1.00)
GFR, Estimated: >60 mL/min (ref >60)
GFR, Estimated: >60 mL/min (ref >60)
Glucose, Bld: 138 mg/dL — ABNORMAL HIGH (ref 70–99)
Glucose, Bld: 120 mg/dL — ABNORMAL HIGH (ref 70–99)
Potassium: 3.5 mmol/L (ref 3.5–5.1)
Potassium: 3.7 mmol/L (ref 3.5–5.1)
Sodium: 129 mmol/L — ABNORMAL LOW (ref 135–145)
Sodium: 129 mmol/L — ABNORMAL LOW (ref 135–145)

## 2021-02-06 LAB — URINALYSIS, ROUTINE W REFLEX MICROSCOPIC
Bilirubin Urine: NEGATIVE
Glucose, UA: 50 mg/dL — AB
Hgb urine dipstick: NEGATIVE
Ketones, ur: 20 mg/dL — AB
Leukocytes,Ua: NEGATIVE
Nitrite: NEGATIVE
Protein, ur: NEGATIVE mg/dL
Specific Gravity, Urine: 1.043 — ABNORMAL HIGH (ref 1.005–1.030)
pH: 6 (ref 5.0–8.0)

## 2021-02-06 LAB — RAPID URINE DRUG SCREEN, HOSP PERFORMED
Amphetamines: NOT DETECTED
Barbiturates: NOT DETECTED
Benzodiazepines: NOT DETECTED
Cocaine: NOT DETECTED
Opiates: NOT DETECTED
Tetrahydrocannabinol: NOT DETECTED

## 2021-02-06 LAB — CBG MONITORING, ED: Glucose-Capillary: 129 mg/dL — ABNORMAL HIGH (ref 70–99)

## 2021-02-06 LAB — TROPONIN I (HIGH SENSITIVITY)
Troponin I (High Sensitivity): 25 ng/L — ABNORMAL HIGH (ref <18)
Troponin I (High Sensitivity): 27 ng/L — ABNORMAL HIGH (ref <18)

## 2021-02-06 LAB — MRSA NEXT GEN BY PCR, NASAL: MRSA by PCR Next Gen: NOT DETECTED

## 2021-02-06 LAB — SARS CORONAVIRUS 2 (TAT 6-24 HRS): SARS Coronavirus 2: NEGATIVE

## 2021-02-06 MED ORDER — LACTATED RINGERS IV SOLN: INTRAVENOUS | Status: DC

## 2021-02-06 MED ORDER — ACETAMINOPHEN 650 MG RE SUPP: 650.0000 mg | Freq: Four times a day (QID) | RECTAL | Status: DC | PRN

## 2021-02-06 MED ORDER — DIPHENHYDRAMINE HCL 50 MG/ML IJ SOLN: 25.0000 mg | Freq: Once | INTRAMUSCULAR | Status: DC

## 2021-02-06 MED ORDER — METOPROLOL TARTRATE 25 MG PO TABS
25.0000 mg | ORAL_TABLET | Freq: Once | ORAL | Status: AC
  Administered 2021-02-06: 25 mg via ORAL
  Filled 2021-02-06: qty 1

## 2021-02-06 MED ORDER — IMMUNE GLOBULIN (HUMAN) 10 GM/100ML IV SOLN
400.0000 mg/kg | Freq: Every day | INTRAVENOUS | Status: AC
Start: 2021-02-06 — End: 2021-02-11
  Administered 2021-02-06 – 2021-02-10 (×5): 25 g via INTRAVENOUS
  Filled 2021-02-06: qty 50
  Filled 2021-02-06: qty 200
  Filled 2021-02-06: qty 50
  Filled 2021-02-06: qty 250
  Filled 2021-02-06 (×2): qty 200

## 2021-02-06 MED ORDER — DILTIAZEM HCL-DEXTROSE 125-5 MG/125ML-% IV SOLN (PREMIX)
5.0000 mg/h | INTRAVENOUS | Status: DC
  Administered 2021-02-06: 5 mg/h via INTRAVENOUS
  Administered 2021-02-07 – 2021-02-08 (×2): 10 mg/h via INTRAVENOUS
  Filled 2021-02-06 (×4): qty 125

## 2021-02-06 MED ORDER — SENNOSIDES-DOCUSATE SODIUM 8.6-50 MG PO TABS: 1.0000 | ORAL_TABLET | Freq: Every evening | ORAL | Status: DC | PRN

## 2021-02-06 MED ORDER — ACETAMINOPHEN 325 MG PO TABS
650.0000 mg | ORAL_TABLET | Freq: Four times a day (QID) | ORAL | Status: DC | PRN
  Administered 2021-02-06: 650 mg via ORAL
  Filled 2021-02-06: qty 2

## 2021-02-06 MED ORDER — SODIUM CHLORIDE 0.9 % IV BOLUS
500.0000 mL | Freq: Once | INTRAVENOUS | Status: AC
  Administered 2021-02-06: 500 mL via INTRAVENOUS

## 2021-02-06 MED ORDER — SODIUM CHLORIDE 0.9 % IV BOLUS: 500.0000 mL | Freq: Once | INTRAVENOUS | Status: DC

## 2021-02-06 MED ORDER — DIPHENHYDRAMINE HCL 50 MG/ML IJ SOLN
25.0000 mg | Freq: Four times a day (QID) | INTRAMUSCULAR | Status: DC | PRN
  Administered 2021-02-06 (×2): 25 mg via INTRAVENOUS
  Filled 2021-02-06 (×2): qty 1

## 2021-02-06 MED ORDER — GADOBUTROL 1 MMOL/ML IV SOLN
6.5000 mL | Freq: Once | INTRAVENOUS | Status: AC | PRN
  Administered 2021-02-06: 6.5 mL via INTRAVENOUS

## 2021-02-06 MED ORDER — HEPARIN (PORCINE) 25000 UT/250ML-% IV SOLN
900.0000 [IU]/h | INTRAVENOUS | Status: DC
  Administered 2021-02-06 – 2021-02-10 (×4): 900 [IU]/h via INTRAVENOUS
  Filled 2021-02-06 (×4): qty 250

## 2021-02-06 MED ORDER — CHLORHEXIDINE GLUCONATE CLOTH 2 % EX PADS
6.0000 | MEDICATED_PAD | Freq: Every day | CUTANEOUS | Status: DC
Start: 2021-02-06 — End: 2021-02-28
  Administered 2021-02-06 – 2021-02-27 (×22): 6 via TOPICAL

## 2021-02-06 MED ORDER — HYDROMORPHONE HCL 1 MG/ML IJ SOLN
INTRAMUSCULAR | Status: AC
  Administered 2021-02-06: 0.5 mg via INTRAVENOUS
  Filled 2021-02-06: qty 1

## 2021-02-06 MED ORDER — LATANOPROST 0.005 % OP SOLN
1.0000 [drp] | Freq: Every day | OPHTHALMIC | Status: DC
  Administered 2021-02-06 – 2021-03-03 (×26): 1 [drp] via OPHTHALMIC
  Filled 2021-02-06: qty 2.5

## 2021-02-06 MED ORDER — HEPARIN BOLUS VIA INFUSION
3000.0000 [IU] | Freq: Once | INTRAVENOUS | Status: AC
  Administered 2021-02-06: 3000 [IU] via INTRAVENOUS
  Filled 2021-02-06: qty 3000

## 2021-02-06 MED ORDER — SODIUM CHLORIDE 0.9 % IV BOLUS
500.0000 mL | Freq: Every day | INTRAVENOUS | Status: DC
  Administered 2021-02-06 – 2021-02-08 (×2): 500 mL via INTRAVENOUS

## 2021-02-06 MED ORDER — IMMUNE GLOBULIN (HUMAN) 10 GM/100ML IV SOLN
400.0000 mg/kg | Freq: Every day | INTRAVENOUS | Status: DC
Start: 2021-02-06 — End: 2021-02-06
  Filled 2021-02-06: qty 250

## 2021-02-06 MED ORDER — IOHEXOL 350 MG/ML SOLN
100.0000 mL | Freq: Once | INTRAVENOUS | Status: AC | PRN
  Administered 2021-02-06: 100 mL via INTRAVENOUS

## 2021-02-06 MED ORDER — DILTIAZEM HCL 25 MG/5ML IV SOLN
10.0000 mg | Freq: Once | INTRAVENOUS | Status: AC
  Administered 2021-02-06: 10 mg via INTRAVENOUS
  Filled 2021-02-06: qty 5

## 2021-02-06 MED ORDER — HYDROMORPHONE HCL 1 MG/ML IJ SOLN: 0.5000 mg | Freq: Once | INTRAMUSCULAR | Status: AC

## 2021-02-06 MED ORDER — DILTIAZEM LOAD VIA INFUSION
15.0000 mg | Freq: Once | INTRAVENOUS | Status: AC
  Administered 2021-02-06: 15 mg via INTRAVENOUS
  Filled 2021-02-06: qty 15

## 2021-02-06 MED ORDER — TIMOLOL MALEATE 0.5 % OP SOLN
1.0000 [drp] | Freq: Two times a day (BID) | OPHTHALMIC | Status: DC
  Administered 2021-02-06 – 2021-03-04 (×51): 1 [drp] via OPHTHALMIC
  Filled 2021-02-06: qty 5

## 2021-02-06 MED ORDER — METOPROLOL SUCCINATE ER 25 MG PO TB24: 25.0000 mg | ORAL_TABLET | Freq: Every day | ORAL | Status: DC

## 2021-02-06 MED ORDER — METOPROLOL TARTRATE 25 MG PO TABS
12.5000 mg | ORAL_TABLET | Freq: Once | ORAL | Status: AC
  Administered 2021-02-06: 12.5 mg via ORAL
  Filled 2021-02-06: qty 1

## 2021-02-06 MED ORDER — METOPROLOL SUCCINATE ER 25 MG PO TB24
12.5000 mg | ORAL_TABLET | Freq: Every day | ORAL | Status: DC
  Administered 2021-02-06: 12.5 mg via ORAL
  Filled 2021-02-06: qty 1

## 2021-02-06 MED ORDER — APIXABAN 5 MG PO TABS: 5.0000 mg | ORAL_TABLET | Freq: Two times a day (BID) | ORAL | Status: DC

## 2021-02-06 NOTE — ED Provider Notes (Signed)
[facility] EMERGENCY DEPARTMENT Provider Note   CSN: [id number] Arrival date & time: 02/05/21  2244     History Chief Complaint  Patient presents with   Weakness    Angela Morales is a 75 y.o. female.  Patient is a 75 year old female with past medical history of atrial flutter, arthritis.  Patient presenting today for evaluation of weakness and numbness in her arms and legs.  This began 3 days ago and is worsening.  She describes weakness in her legs and has been unable to ambulate today due to this.  She denies any headache.  She does report some chest discomfort, but no shortness of breath.  Patient seen in triage and CTA of the chest, abdomen, and pelvis ordered and patient was roomed.  The history is provided by the patient.  Weakness Severity:  Moderate Onset quality:  Gradual Duration:  3 days Timing:  Constant Progression:  Worsening Chronicity:  New Relieved by:  Nothing Worsened by:  Nothing Ineffective treatments:  None tried     Past Medical History:  Diagnosis Date   Abnormal uterine bleeding    on HRT   Allergy    seasonal   Arthritis    Atrial fibrillation (HCC)    Celiac disease    DDD (degenerative disc disease)    Dysrhythmia    Glaucoma    Microscopic colitis    Osteoarthritis of both knees    PONV (postoperative nausea and vomiting)    SVT (supraventricular tachycardia) ([place])     Patient Active Problem List   Diagnosis Date Noted   Secondary hypercoagulable state ([place]) 11/13/2020   Primary osteoarthritis of left knee 03/22/2016   Atrial fibrillation with RVR ([place]) 12/01/2013   A-fib ([place]) 11/30/2013   Hypotension 11/30/2013   Atrial flutter ([place]) 11/30/2013    Past Surgical History:  Procedure Laterality Date   APPENDECTOMY     ATRIAL FIBRILLATION ABLATION N/A 01/28/2021   Procedure: ATRIAL FIBRILLATION ABLATION;  Surgeon: Constance Haw, MD;  Location: [place] [facility];  Service: Cardiovascular;   Laterality: N/A;   CARDIOVERSION N/A 10/30/2020   Procedure: CARDIOVERSION;  Surgeon: Jerline Pain, MD;  Location: [facility];  Service: Cardiovascular;  Laterality: N/A;   COLONOSCOPY     FOOT FRACTURE SURGERY  10/2013   TONSILLECTOMY AND ADENOIDECTOMY     TOTAL KNEE ARTHROPLASTY Left 03/22/2016   Procedure: TOTAL KNEE ARTHROPLASTY;  Surgeon: Dorna Leitz, MD;  Location: [facility];  Service: Orthopedics;  Laterality: Left;   TUBAL LIGATION  1981   VAGINAL HYSTERECTOMY  04/20/05   prolapse, adenomyosis     OB History     Gravida  3   Para  3   Term  3   Preterm      AB      Living  2      SAB      IAB      Ectopic      Multiple      Live Births  3           Family History  Problem Relation Age of Onset   Osteoporosis Mother    Stroke Mother        mini   Prostate cancer Father    Heart disease Brother 86       shunt put in heart   Osteoarthritis Maternal Grandmother    Bipolar disorder Son    Liver disease Son    Breast cancer Cousin  1st cousin   Colon cancer Neg Hx     Social History   Tobacco Use   Smoking status: Never   Smokeless tobacco: Never  Substance Use Topics   Alcohol use: Yes    Alcohol/week: 4.0 - 6.0 standard drinks    Types: 4 - 6 Glasses of wine per week    Comment: occasional   Drug use: No    Home Medications Prior to Admission medications   Medication Sig Start Date End Date Taking? Authorizing Provider  apixaban (ELIQUIS) 5 MG TABS tablet TAKE 1 TABLET BY MOUTH TWICE A DAY 12/22/20   Camnitz, Ocie Doyne, MD  gabapentin (NEURONTIN) 300 MG capsule Take 300 mg by mouth at bedtime. 01/15/21   [provider]  ibuprofen (ADVIL,MOTRIN) 200 MG tablet Take 400 mg by mouth every 6 (six) hours as needed for moderate pain.    [provider]  loratadine (CLARITIN) 10 MG tablet Take 10 mg by mouth daily as needed for allergies.    [provider]  metoprolol succinate (TOPROL-XL) 25 MG 24 hr tablet  TAKE 1 TABLET BY MOUTH EVERY DAY 08/25/20   Croitoru, Mihai, MD  Polyethyl Glycol-Propyl Glycol (SYSTANE) 0.4-0.3 % SOLN Place 1-2 drops into both eyes 3 (three) times daily as needed (dry eyes).    [provider]  psyllium (METAMUCIL) 58.6 % packet Take 1 packet by mouth in the morning.    [provider]  timolol (TIMOPTIC) 0.5 % ophthalmic solution Place 1 drop into the left eye 2 (two) times daily. 08/17/16   [provider]  ZIOPTAN 0.0015 % SOLN Place 1 drop into the left eye at bedtime. 01/13/21   [provider]    Allergies    Gluten meal and Cefdinir  Review of Systems   Review of Systems  Neurological:  Positive for weakness.  All other systems reviewed and are negative.  Physical Exam Updated Vital Signs BP (!) 173/103   Pulse 81   Temp 97.6 F (36.4 C)   Resp 17   LMP 04/20/2005   SpO2 97%   Physical Exam Vitals and nursing note reviewed.  Constitutional:      General: She is not in acute distress.    Appearance: She is well-developed. She is not diaphoretic.  HENT:     Head: Normocephalic and atraumatic.  Cardiovascular:     Rate and Rhythm: Normal rate and regular rhythm.     Heart sounds: No murmur heard.   No friction rub. No gallop.  Pulmonary:     Effort: Pulmonary effort is normal. No respiratory distress.     Breath sounds: Normal breath sounds. No wheezing.  Abdominal:     General: Bowel sounds are normal. There is no distension.     Palpations: Abdomen is soft.     Tenderness: There is no abdominal tenderness.  Musculoskeletal:        General: Normal range of motion.     Cervical back: Normal range of motion and neck supple.  Skin:    General: Skin is warm and dry.  Neurological:     General: No focal deficit present.     Mental Status: She is alert and oriented to person, place, and time.     Sensory: Sensory deficit present.     Motor: Weakness present.     Comments: Strength is 4 out of 5 in both upper  and both lower extremities.  She describes decreased sensation in a stocking distribution of her  legs and extending into her forearms.  I am unable to elicit DTRs in the patellar or Achilles tendons.    ED Results / Procedures / Treatments   Labs (all labs ordered are listed, but only abnormal results are displayed) Labs Reviewed  CBC WITH DIFFERENTIAL/PLATELET - Abnormal; Notable for the following components:      Result Value   Hemoglobin 15.5 (*)    HCT 46.1 (*)    Neutro Abs 8.3 (*)    All other components within normal limits  BASIC METABOLIC PANEL - Abnormal; Notable for the following components:   Sodium 129 (*)    Chloride 96 (*)    Glucose, Bld 138 (*)    BUN 5 (*)    All other components within normal limits  CBG MONITORING, ED - Abnormal; Notable for the following components:   Glucose-Capillary 129 (*)    All other components within normal limits  TROPONIN I (HIGH SENSITIVITY) - Abnormal; Notable for the following components:   Troponin I (High Sensitivity) 25 (*)    All other components within normal limits  TROPONIN I (HIGH SENSITIVITY)    EKG EKG Interpretation  Date/Time:  Thursday February 05 2021 23:59:32 EDT Ventricular Rate:  79 PR Interval:  234 QRS Duration: 83 QT Interval:  434 QTC Calculation: 498 R Axis:   33 Text Interpretation: Sinus rhythm Prolonged PR interval Biatrial enlargement Borderline prolonged QT interval When compared with ECG of 01/28/2021, No significant change was found Confirmed by Delora Fuel ([id number]) on 02/06/2021 1:09:29 AM  Radiology DG Chest Port 1 View  Result Date: 02/05/2021 CLINICAL DATA:  Chest pain EXAM: PORTABLE CHEST 1 VIEW COMPARISON:  03/08/2016 FINDINGS: Mild hyperinflation. Heart and mediastinal contours are within normal limits. No focal opacities or effusions. No acute bony abnormality. Aortic atherosclerosis. IMPRESSION: Hyperinflation. No active disease. Electronically Signed   By: Rolm Baptise M.D.   On: 02/05/2021  23:30   CT Angio Chest/Abd/Pel for Dissection W and/or Wo Contrast  Result Date: 02/06/2021 CLINICAL DATA:  Chest and back pain, dissection suspected EXAM: CT ANGIOGRAPHY CHEST, ABDOMEN AND PELVIS TECHNIQUE: Non-contrast CT of the chest was initially obtained. Multidetector CT imaging through the chest, abdomen and pelvis was performed using the standard protocol during bolus administration of intravenous contrast. Multiplanar reconstructed images and MIPs were obtained and reviewed to evaluate the vascular anatomy. CONTRAST:  118m OMNIPAQUE IOHEXOL 350 MG/ML SOLN COMPARISON:  Lumbar MRI 09/08/2020 FINDINGS: CTA CHEST FINDINGS Cardiovascular: Initial noncontrast CT imaging through the chest reveals an atherosclerotic thoracic aorta without hyperdense mural thickening or plaque displacement to suggest intramural hematoma. Postcontrast administration there is satisfactory arterial phase enhancement for assessment of the arterial vasculature. Insert air aortic root The aorta is normal caliber. No acute luminal abnormality of the imaged aorta. No periaortic stranding or hemorrhage. 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 Proximal great vessels are otherwise unremarkable and free of acute abnormality. Biatrial enlargement. Otherwise normal cardiac size. No pericardial effusion. Coronary atherosclerosis. Central pulmonary arteries are normal caliber. No large central or lobar pulmonary arterial filling defects with more distal evaluation limited by this non tailored exam. No major venous abnormalities. Mediastinum/Nodes: No mediastinal fluid or gas. Normal thyroid gland and thoracic inlet. No acute abnormality of the trachea or thoracic esophagus. No worrisome mediastinal, hilar or axillary adenopathy. Lungs/Pleura: Airways patent. Atelectatic changes. No consolidation, features of edema, pneumothorax, or effusion. No suspicious pulmonary nodules or masses. Mild  dependent atelectasis. Musculoskeletal: No acute osseous abnormality  or suspicious osseous lesion. Multilevel degenerative changes are present in the imaged portions of the spine. Additional degenerative changes in the shoulders. No worrisome chest wall mass or lesion. Review of the MIP images confirms the above findings. CTA ABDOMEN AND PELVIS FINDINGS VASCULAR Aorta: Calcified noncalcified atheromatous plaque in the normal caliber aorta without aneurysm, dissection, vasculitis or significant stenosis. Celiac: Mild ostial plaque narrowing. Otherwise widely patent without evidence of aneurysm, dissection, vasculitis or significant stenosis. SMA: Mild ostial plaque narrowing. Otherwise patent without evidence of aneurysm, dissection, vasculitis or significant stenosis. Renals: Single renal arteries bilaterally. At most mild left ostial plaque narrowing. Both renal arteries otherwise appear widely patent without aneurysm, dissection, vasculitis or features of fibromuscular dysplasia. IMA: Patent without evidence of aneurysm, dissection, vasculitis or significant stenosis. Inflow: Minimal atherosclerotic plaque throughout the inflow vasculature most pronounced in the common and internal segments. No flow-limiting stenosis or occlusion. No evidence of aneurysm, dissection or vasculitis. Included portion of the proximal outflow including the common, superficial and deep femoral arteries appear grossly unremarkable without acute luminal abnormality, significant stenosis or occlusion. Veins: No major venous abnormality within limitations of this arterial phase exam. Review of the MIP images confirms the above findings. NON-VASCULAR Hepatobiliary: Diffuse hepatic hypoattenuation possibly related to contrast timing or fatty infiltration. Gallbladder is mildly distended. Question some very faint pericholecystic inflammation is noted particular towards the fundus. Pancreas: No pancreatic ductal dilatation or surrounding  inflammatory changes. Spleen: Diminutive spleen, nonspecific.  No concerning splenic mass. Adrenals/Urinary Tract: Normal adrenals. Kidneys are normally located with symmetric enhancement. No suspicious renal lesion, urolithiasis or hydronephrosis. Bladder is unremarkable for the degree of distention. Stomach/Bowel: Distal esophagus and stomach are unremarkable. Large air and fluid-filled duodenal diverticulum without adjacent inflammation (7/178) measuring up to 3.8 cm in size. Normal duodenal sweep across the midline abdomen. No small bowel thickening or dilatation. Prior appendectomy. Extensive distal colonic diverticulosis and mild mural thickening of the affected segment of sigmoid colon albeit without focal inflammation to suggest an acute inflammatory process. No evidence of obstruction. Lymphatic: No suspicious or enlarged lymph nodes in the included lymphatic chains. Reproductive: Uterus is surgically absent. No concerning adnexal lesions. Other: No abdominopelvic free fluid or free gas. No bowel containing hernias. Musculoskeletal: No acute osseous abnormality or suspicious osseous lesion. Multilevel degenerative changes are present in the imaged portions of the spine. Retrolisthesis L2 on L3., anterolisthesis L5 on S1. Multilevel discogenic and facet degenerative changes appear grossly similar to comparison MR . Levocurvature of the lumbar spine with pelvic tilt. Degenerative changes in the hips and pelvis. Review of the MIP images confirms the above findings. IMPRESSION: 1. No evidence of aortic dissection or other acute aortic syndrome. 2. Minimal atelectatic changes in the lungs. No other acute intrathoracic process. 3. Gallbladder distension with some faint pericholecystic stranding towards the fundus. Could reflect an early or developing acute cholecystitis. Correlate with serologies and further imaging with right upper quadrant ultrasound. 4. Large air-filled duodenal diverticulum without focal  inflammation to suggest an inflammatory change at this time. 5. Segmental thickening of the sigmoid in a region of numerous colonic diverticula without adjacent stranding or phlegmon. Can reflect sequela of prior inflammation or a more chronic smoldering inflammation such as segmental colitis associated with diverticulosis. Correlate with clinical symptoms and consider outpatient direct visualization if not recently performed 6. Biatrial enlargement and coronary artery atherosclerosis. 7. Aortic Atherosclerosis (ICD10-I70.0). Mild ostial plaque narrowing of the celiac axis, SMA and left renal artery origin. Electronically Signed   By: March Rummage  [facility] M.D.   On: 02/06/2021 01:38    Procedures Procedures   Medications Ordered in ED Medications  iohexol (OMNIPAQUE) 350 MG/ML injection 100 mL (100 mLs Intravenous Contrast Given 02/06/21 0109)    ED Course  I have reviewed the triage vital signs and the nursing notes.  Pertinent labs & imaging results that were available during my care of the patient were reviewed by me and considered in my medical decision making (see chart for details).    MDM Rules/Calculators/A&P  Patient presenting with 3-day history of weakness in her arms and legs which has made it difficult for her to ambulate.  Laboratory studies are unremarkable.  Initial CT scan was obtained in triage to rule out dissection.  This was negative.  Patient then evaluated by myself.  I am unable to elicit reflexes in her lower extremities and she reports decreased sensation.  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  I have discussed the case with Dr. Curly Shores from neurology.  She has also evaluated the patient and is in agreement with this assessment.  Patient will be admitted to the hospitalist service for further care.  Final Clinical Impression(s) / ED Diagnoses Final diagnoses:  None    Rx / DC Orders ED Discharge Orders     None        Veryl Speak,  MD 02/06/21 [phone]

## 2021-02-06 NOTE — Progress Notes (Signed)
Reviewed with patient s/sx to report to nurse with the IVIG infusion. Shortly after infusion started pt c/o nose and chest itching. Infusion paused and nurse was notified and instructed to call MD. Fran Lowes, RN VAST

## 2021-02-06 NOTE — TOC Initial Note (Signed)
Transition of Care [facility]) - Initial/Assessment Note    Patient Details  Name: Angela Morales MRN: [id number] Date of Birth: [birth year]  [facility] of [facility]) CM/SW Contact:    Verdell Carmine, RN Phone Number: 02/06/2021, 5:39 PM  Clinical Narrative:                  75 year old patient admitted with GBS. Had ablation for atrial fibrillation. From home with husband. Started on IVIG with some reaction. May need SNF Vs HH Will need PT evaluation for recommendations. CM/CSW will follow for needs and transitions.   Expected Discharge Plan: Home/Self Care Barriers to Discharge: Continued Medical Work up   Patient Goals and CMS Choice        Expected Discharge Plan and Services Expected Discharge Plan: Home/Self Care       Living arrangements for the past 2 months: Single Family Home                                      Prior Living Arrangements/Services Living arrangements for the past 2 months: Single Family Home Lives with:: Spouse Patient language and need for interpreter reviewed:: Yes        Need for Family Participation in Patient Care: Yes (Comment) Care giver support system in place?: Yes (comment)   Criminal Activity/Legal Involvement Pertinent to Current Situation/Hospitalization: No - Comment as needed  Activities of Daily Living      Permission Sought/Granted                  Emotional Assessment       Orientation: : Oriented to Self, Oriented to Place, Oriented to  Time, Oriented to Situation Alcohol / Substance Use: Not Applicable Psych Involvement: No (comment)  Admission diagnosis:  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 [place] [facility] [place] [address] [place] [address] Phone: [phone] Fax: [phone]     Social Determinants of Health (SDOH) Interventions    Readmission Risk Interventions No flowsheet data found.

## 2021-02-06 NOTE — Progress Notes (Signed)
RT note FVC 1.24 L NIF -22  Good Effort. Sitting upright.

## 2021-02-06 NOTE — ED Notes (Signed)
Attempted to give report x2 

## 2021-02-06 NOTE — ED Notes (Signed)
Pt called out asking if she can use the external catheter because the bed pan was not working for her. Pt stated that she has not urinated in over 24 hours. I will bladder scan pt to see how much she has and notify  RN.

## 2021-02-06 NOTE — ED Notes (Addendum)
MD at bedside when I did bladder scan pt first scan was 780m and second scan was 6852m

## 2021-02-06 NOTE — Progress Notes (Signed)
ANTICOAGULATION CONSULT NOTE - Initial Consult  Pharmacy Consult for heparin Indication: atrial fibrillation  Allergies  Allergen Reactions   Gluten Meal Other (See Comments)    celiac disease   Cefdinir Rash    Patient Measurements:   Heparin Dosing Weight: 64kg  Vital Signs: Temp: 97.6 F (36.4 C) (08/12 1652) Temp Source: Axillary (08/12 1700) BP: 135/103 (08/12 1652) Pulse Rate: 120 (08/12 1652)  Labs: Recent Labs    02/05/21 2251 02/06/21 0121 02/06/21 0831  HGB 15.5*  --  14.5  HCT 46.1*  --  42.8  PLT 282  --  263  CREATININE 0.46  --  0.41*  TROPONINIHS 25* 27*  --     Estimated Creatinine Clearance: 52.5 mL/min (A) (by C-G formula based on SCr of 0.41 mg/dL (L)).   Medical History: Past Medical History:  Diagnosis Date   Abnormal uterine bleeding    on HRT   Allergy    seasonal   Arthritis    Atrial fibrillation (HCC)    Celiac disease    DDD (degenerative disc disease)    Dysrhythmia    Glaucoma    Microscopic colitis    Osteoarthritis of both knees    PONV (postoperative nausea and vomiting)    SVT (supraventricular tachycardia) (HCC)     Medications:  Facility-Administered Medications Prior to Admission  Medication Dose Route Frequency Provider Last Rate Last Admin   0.9 %  sodium chloride infusion  500 mL Intravenous Continuous [name], Estill Cotta III, MD       Medications Prior to Admission  Medication Sig Dispense Refill Last Dose   apixaban (ELIQUIS) 5 MG TABS tablet TAKE 1 TABLET BY MOUTH TWICE A DAY 180 tablet 1 02/05/2021 at 0830   gabapentin (NEURONTIN) 300 MG capsule Take 300 mg by mouth at bedtime.   02/04/2021   ibuprofen (ADVIL,MOTRIN) 200 MG tablet Take 400 mg by mouth every 6 (six) hours as needed for moderate pain.   Past Week   loratadine (CLARITIN) 10 MG tablet Take 10 mg by mouth daily as needed for allergies.   PRN   metoprolol succinate (TOPROL-XL) 25 MG 24 hr tablet TAKE 1 TABLET BY MOUTH EVERY DAY 90 tablet 3 02/05/2021 at  0830   Polyethyl Glycol-Propyl Glycol (SYSTANE) 0.4-0.3 % SOLN Place 1-2 drops into both eyes 3 (three) times daily as needed (dry eyes).   Past Week   psyllium (METAMUCIL) 58.6 % packet Take 1 packet by mouth in the morning.   02/05/2021   timolol (TIMOPTIC) 0.5 % ophthalmic solution Place 1 drop into the left eye 2 (two) times daily.   02/05/2021   ZIOPTAN 0.0015 % SOLN Place 1 drop into the left eye at bedtime.   02/04/2021    Assessment: 75 year old female with history of afib on apixaban prior to admit. 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 Last dose of apixaban appears to be prior to admission on 8/11 at 0830. Patient back in afib this evening, will bridge with heparin while apixaban washes out. CBC appears normal.   Goal of Therapy:  Heparin level 0.3-0.7 units/ml aPTT 66-102 seconds Monitor platelets by anticoagulation protocol: Yes   Plan:  Give 3000 units bolus x 1 Start heparin infusion at 900 units/hr Check anti-Xa level in 8 hours and daily while on heparin Continue to monitor H&H and platelets  Erin Hearing PharmD., BCPS Clinical Pharmacist 02/06/2021 6:48 PM

## 2021-02-06 NOTE — ED Notes (Signed)
Attempted to give reportx1 

## 2021-02-06 NOTE — Significant Event (Signed)
Rapid Response Event Note   Reason for Call :  Tachycardia  Initial Focused Assessment:  While on the unit, RN asked me to take a look at her patient. This patient has a new diagnosis of GB and was ordered to receive IVIG. She required some IV benadryl because there was some concern about her reacting to the IVIG. The patient denies any shortness of breath, lip/tongue tingling or swelling, and is able to swallow appropriately.  During this she went back into atrial flutter. She endorses that she can feel her heart racing. She has a history of Afib and has been cardioverted and had an ablation.  She mentioned that she was taken off of her DOAC when she went back into NSR.   The patient states that she feels that she is unable to void when I was in the room. Bladder scanner showed >500 mL of urine in her bladder. The RN had to straight cath her earlier.  BP 135/103 HR 121 RR 19 O2 97%   Interventions:  Vitals taken  IV started Assisted with urinary catheter insertion  Plan of Care:  Patient will remain on unit. She will be given metoprolol, Cardizem, and started on a heparin gtt. MD ordered PICC line to be placed.   Event Summary:   MD Notified: [place] Call Time: on unit Arrival Time: End Time: 1830 Venetia Maxon, RN

## 2021-02-06 NOTE — Progress Notes (Signed)
RT NOTE:  NIF: -30  VC: 1.6L  Good patient effort given at this time.

## 2021-02-06 NOTE — Progress Notes (Signed)
   02/06/21 1652  Assess: MEWS Score  Temp 97.6 F (36.4 C)  BP (!) 135/103  Pulse Rate (!) 120  ECG Heart Rate (!) 140  Resp 18  SpO2 95 %  O2 Device Room Air  Assess: MEWS Score  MEWS Temp 0  MEWS Systolic 0  MEWS Pulse 3  MEWS RR 0  MEWS LOC 0  MEWS Score 3  MEWS Score Color Yellow  Assess: if the MEWS score is Yellow or Red  Were vital signs taken at a resting state? Yes  Focused Assessment No change from prior assessment  Early Detection of Sepsis Score *See Row Information* Low  MEWS guidelines implemented *See Row Information* Yes  Treat  MEWS Interventions Escalated (See documentation below) (MD paged)  Pain Scale 0-10  Pain Score 5  Pain Type Chronic pain  Pain Location Back  Pain Orientation Lower;Medial  Pain Descriptors / Indicators Discomfort;Dull  Pain Frequency Constant  Pain Onset Unable to tell  Patients Stated Pain Goal 1  Pain Intervention(s) Repositioned  Multiple Pain Sites No  Take Vital Signs  Increase Vital Sign Frequency  Yellow: Q 2hr X 2 then Q 4hr X 2, if remains yellow, continue Q 4hrs  Notify: Provider  Provider Name/Title Dr Emeline Gins  Date Provider Notified 02/06/21  Time Provider Notified [id number]  Notification Type Page  Notification Reason Other (Comment) (pt having reaction to IVIG infusion.)  Provider response See new orders  Date of Provider Response 02/06/21  Time of Provider Response 1652   Rapid Response at bedside.

## 2021-02-06 NOTE — Progress Notes (Signed)
RT note: Patient with good effort pulled a -30 NIF.

## 2021-02-06 NOTE — ED Notes (Signed)
I suggested pt sit in recliner chair to get out of bed for awhile, but pt stated she can't stand at all and would need to be lifted into chair.

## 2021-02-06 NOTE — H&P (Signed)
History and Physical    Angela Morales MWU:[id number] DOB: 1946-05-02 DOA: 02/05/2021  PCP: Deland Pretty, MD   Patient coming from: Home  Chief Complaint: Weakness in arms and legs  HPI: Angela Morales is a 75 y.o. female with medical history significant for  atrial fibrillation (on anticoagulation, recently s/p ablation on 8/3 after failed cardioversion on 10/30/2020), celiac disease, degenerative disc disease who presents with complaint of weakness and numbness in arms and legs. The symptoms began 3 days ago and have progressively worsened. She initially states she had some numbness in fingertips and then noticed numbness and weakness starting in her legs that moved from her feet up to her hips.  She reports that yesterday she was unable to walk secondary to the weakness.  She reports she has not had any headache, shortness of breath, abdominal pain, nausea vomiting or diarrhea.  States she has not had any recent illnesses or upper respiratory infections.  She had a COVID booster vaccine 4 months ago but no vaccines since then.  She underwent a cardiac ablation 10 days ago for atrial fibrillation.  She has noticed palpitations and irregular heartbeat since yesterday morning but no chest pain or pressure.  She has not noticed any factors that contributed to the development of her symptoms and she has not had any alleviating remedies at home.  Denies tobacco alcohol or illicit drug use.  ED Course: Ms. Minteer has been hemodynamically stable in the emergency room.  She has had periods of significant tachycardia with heart rate in the 130-140 range with atrial fibrillation on monitor.  Sodium level was mildly low at 129.  Sodium was 137 a few weeks ago on labs.  CBC is unremarkable.  Troponin was mildly elevated at 25.  Potassium was 3.5, chloride 96, bicarb 24, creatinine 0.46, BUN 5, calcium 9.4.  Neurology was consulted and evaluated patient in the emergency room.  They recommended medicine  admission and neurology will consult and follow patient.  They are going to start IVIG therapy.  Patient may need a lumbar puncture but will have to be delayed as she took Eliquis yesterday morning.  Neurology will make decision on LP later today.  MRI has been ordered by neurology and is pending.  Review of Systems:  General: Reports generalized weakness. Denies fever, chills, weight loss, night sweats. Denies dizziness. Denies change in appetite HENT: Denies head trauma, headache, denies change in hearing, tinnitus. Denies nasal congestion. Denies sore throat. Denies difficulty swallowing Eyes: Denies blurry vision, pain in eye, drainage.  Denies discoloration of eyes. Neck: Denies pain.  Denies swelling.  Denies pain with movement. Cardiovascular: Reports palpitations. Denies chest pain.  Denies edema.  Denies orthopnea Respiratory: Denies shortness of breath, cough.  Denies wheezing.  Denies sputum production Gastrointestinal: Denies abdominal pain, swelling.  Denies nausea, vomiting, diarrhea.  Denies melena.  Denies hematemesis. Musculoskeletal: Denies limitation of movement.  Denies deformity or swelling.  Denies pain.  Denies arthralgias or myalgias. Genitourinary: Denies pelvic pain.  Denies urinary frequency or hesitancy.  Denies dysuria.  Skin: Denies rash.  Denies petechiae, purpura, ecchymosis. Neurological: Denies syncope.  Denies seizure activity. Denies slurred speech, drooping face.  Denies visual change. Psychiatric: Denies depression, anxiety. Denies hallucinations.  Past Medical History:  Diagnosis Date   Abnormal uterine bleeding    on HRT   Allergy    seasonal   Arthritis    Atrial fibrillation (HCC)    Celiac disease    DDD (degenerative disc disease)  Dysrhythmia    Glaucoma    Microscopic colitis    Osteoarthritis of both knees    PONV (postoperative nausea and vomiting)    SVT (supraventricular tachycardia) (HCC)     Past Surgical History:  Procedure  Laterality Date   APPENDECTOMY     ATRIAL FIBRILLATION ABLATION N/A 01/28/2021   Procedure: ATRIAL FIBRILLATION ABLATION;  Surgeon: Constance Haw, MD;  Location: [facility];  Service: Cardiovascular;  Laterality: N/A;   CARDIOVERSION N/A 10/30/2020   Procedure: CARDIOVERSION;  Surgeon: Jerline Pain, MD;  Location: [facility];  Service: Cardiovascular;  Laterality: N/A;   COLONOSCOPY     FOOT FRACTURE SURGERY  10/2013   TONSILLECTOMY AND ADENOIDECTOMY     TOTAL KNEE ARTHROPLASTY Left 03/22/2016   Procedure: TOTAL KNEE ARTHROPLASTY;  Surgeon: Dorna Leitz, MD;  Location: [facility];  Service: Orthopedics;  Laterality: Left;   TUBAL LIGATION  1981   VAGINAL HYSTERECTOMY  04/20/05   prolapse, adenomyosis    Social History  reports that she has never smoked. She has never used smokeless tobacco. She reports current alcohol use of about 4.0 - 6.0 standard drinks per week. She reports that she does not use drugs.  Allergies  Allergen Reactions   Gluten Meal Other (See Comments)    celiac disease   Cefdinir Rash    Family History  Problem Relation Age of Onset   Osteoporosis Mother    Stroke Mother        mini   Prostate cancer Father    Heart disease Brother 59       shunt put in heart   Osteoarthritis Maternal Grandmother    Bipolar disorder Son    Liver disease Son    Breast cancer Cousin        1st cousin   Colon cancer Neg Hx      Prior to Admission medications   Medication Sig Start Date End Date Taking? Authorizing Provider  apixaban (ELIQUIS) 5 MG TABS tablet TAKE 1 TABLET BY MOUTH TWICE A DAY 12/22/20   Camnitz, Ocie Doyne, MD  gabapentin (NEURONTIN) 300 MG capsule Take 300 mg by mouth at bedtime. 01/15/21   [provider]  ibuprofen (ADVIL,MOTRIN) 200 MG tablet Take 400 mg by mouth every 6 (six) hours as needed for moderate pain.    [provider]  loratadine (CLARITIN) 10 MG tablet Take 10 mg by mouth daily as needed for allergies.     [provider]  metoprolol succinate (TOPROL-XL) 25 MG 24 hr tablet TAKE 1 TABLET BY MOUTH EVERY DAY 08/25/20   Croitoru, Mihai, MD  Polyethyl Glycol-Propyl Glycol (SYSTANE) 0.4-0.3 % SOLN Place 1-2 drops into both eyes 3 (three) times daily as needed (dry eyes).    [provider]  psyllium (METAMUCIL) 58.6 % packet Take 1 packet by mouth in the morning.    [provider]  timolol (TIMOPTIC) 0.5 % ophthalmic solution Place 1 drop into the left eye 2 (two) times daily. 08/17/16   [provider]  ZIOPTAN 0.0015 % SOLN Place 1 drop into the left eye at bedtime. 01/13/21   [provider]    Physical Exam: Vitals:   02/06/21 0130 02/06/21 0215 02/06/21 0300 02/06/21 0345  BP: (!) 173/103 (!) 166/100 (!) 154/104 (!) 125/58  Pulse: 81 (!) 109 (!) 42 (!) 110  Resp: 17 (!) 22 17 18   Temp:      SpO2: 97% 100% 99% 99%    Constitutional: NAD,  calm, comfortable Vitals:   02/06/21 0130 02/06/21 0215 02/06/21 0300 02/06/21 0345  BP: (!) 173/103 (!) 166/100 (!) 154/104 (!) 125/58  Pulse: 81 (!) 109 (!) 42 (!) 110  Resp: 17 (!) 22 17 18   Temp:      SpO2: 97% 100% 99% 99%   General: WDWN, Alert and oriented x3.  Eyes: EOMI, PERRL, conjunctivae normal.  Sclera nonicteric HENT:  [place]/AT, external ears normal.  Nares patent without epistasis.  Mucous membranes are moist. Posterior pharynx clear  Neck: Soft, normal range of motion, supple, no masses, no thyromegaly.  Trachea midline Respiratory: clear to auscultation bilaterally, no wheezing, no crackles. Normal respiratory effort. No accessory muscle use.  Cardiovascular: Irregularly irregular rhythm with tachycardia.  No murmurs / rubs / gallops. No extremity edema. 1+ pedal pulses. Abdomen: Soft, no tenderness, nondistended, no rebound or guarding. No masses palpated. Bowel sounds normoactive Musculoskeletal: FROM. No cyanosis. No joint deformity upper and lower extremities. Normal muscle tone.  Skin:  Warm, dry, intact no rashes, lesions, ulcers. No induration Neurologic: CN 2-12 grossly intact.  Normal speech.  Sensation decreased in legs and hands.  patella DTR absent bilaterally. Strength 4/5 in upper extremities.  Neck flexion strength 3/5. Lower extremities with 2/5 strength. Grip strength 3/5 bilaterally. No tremor. No pill rolling.  Psychiatric: Normal judgment and insight.  Normal mood.    Labs on Admission: I have personally reviewed following labs and imaging studies  CBC: Recent Labs  Lab 02/05/21 2251  WBC 10.3  NEUTROABS 8.3*  HGB 15.5*  HCT 46.1*  MCV 98.7  PLT 297    Basic Metabolic Panel: Recent Labs  Lab 02/05/21 2251  NA 129*  K 3.5  CL 96*  CO2 24  GLUCOSE 138*  BUN 5*  CREATININE 0.46  CALCIUM 9.4    GFR: Estimated Creatinine Clearance: 52.5 mL/min (by C-G formula based on SCr of 0.46 mg/dL).  Liver Function Tests: No results for input(s): AST, ALT, ALKPHOS, BILITOT, PROT, ALBUMIN in the last 168 hours.  Urine analysis:    Component Value Date/Time   COLORURINE YELLOW 03/08/2016 [place] 03/08/2016 0955   LABSPEC <1.005 (L) 03/08/2016 0955   PHURINE 5.5 03/08/2016 0955   GLUCOSEU NEGATIVE 03/08/2016 0955   HGBUR NEGATIVE 03/08/2016 0955   BILIRUBINUR NEGATIVE 03/08/2016 0955   KETONESUR NEGATIVE 03/08/2016 0955   PROTEINUR NEGATIVE 03/08/2016 0955   UROBILINOGEN 0.2 12/01/2013 1339   NITRITE NEGATIVE 03/08/2016 0955   LEUKOCYTESUR NEGATIVE 03/08/2016 0955    Radiological Exams on Admission: DG Chest Port 1 View  Result Date: 02/05/2021 CLINICAL DATA:  Chest pain EXAM: PORTABLE CHEST 1 VIEW COMPARISON:  03/08/2016 FINDINGS: Mild hyperinflation. Heart and mediastinal contours are within normal limits. No focal opacities or effusions. No acute bony abnormality. Aortic atherosclerosis. IMPRESSION: Hyperinflation. No active disease. Electronically Signed   By: Rolm Baptise M.D.   On: 02/05/2021 23:30   CT Angio  Chest/Abd/Pel for Dissection W and/or Wo Contrast  Result Date: 02/06/2021 CLINICAL DATA:  Chest and back pain, dissection suspected EXAM: CT ANGIOGRAPHY CHEST, ABDOMEN AND PELVIS TECHNIQUE: Non-contrast CT of the chest was initially obtained. Multidetector CT imaging through the chest, abdomen and pelvis was performed using the standard protocol during bolus administration of intravenous contrast. Multiplanar reconstructed images and MIPs were obtained and reviewed to evaluate the vascular anatomy. CONTRAST:  134m OMNIPAQUE IOHEXOL 350 MG/ML SOLN COMPARISON:  Lumbar MRI 09/08/2020 FINDINGS: CTA CHEST FINDINGS Cardiovascular: Initial noncontrast CT imaging through the chest reveals  an atherosclerotic thoracic aorta without hyperdense mural thickening or plaque displacement to suggest intramural hematoma. Postcontrast administration there is satisfactory arterial phase enhancement for assessment of the arterial vasculature. Insert air aortic root The aorta is normal caliber. No acute luminal abnormality of the imaged aorta. No periaortic stranding or hemorrhage. 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 Proximal great vessels are otherwise unremarkable and free of acute abnormality. Biatrial enlargement. Otherwise normal cardiac size. No pericardial effusion. Coronary atherosclerosis. Central pulmonary arteries are normal caliber. No large central or lobar pulmonary arterial filling defects with more distal evaluation limited by this non tailored exam. No major venous abnormalities. Mediastinum/Nodes: No mediastinal fluid or gas. Normal thyroid gland and thoracic inlet. No acute abnormality of the trachea or thoracic esophagus. No worrisome mediastinal, hilar or axillary adenopathy. Lungs/Pleura: Airways patent. Atelectatic changes. No consolidation, features of edema, pneumothorax, or effusion. No suspicious pulmonary nodules or masses. Mild dependent atelectasis.  Musculoskeletal: No acute osseous abnormality or suspicious osseous lesion. Multilevel degenerative changes are present in the imaged portions of the spine. Additional degenerative changes in the shoulders. No worrisome chest wall mass or lesion. Review of the MIP images confirms the above findings. CTA ABDOMEN AND PELVIS FINDINGS VASCULAR Aorta: Calcified noncalcified atheromatous plaque in the normal caliber aorta without aneurysm, dissection, vasculitis or significant stenosis. Celiac: Mild ostial plaque narrowing. Otherwise widely patent without evidence of aneurysm, dissection, vasculitis or significant stenosis. SMA: Mild ostial plaque narrowing. Otherwise patent without evidence of aneurysm, dissection, vasculitis or significant stenosis. Renals: Single renal arteries bilaterally. At most mild left ostial plaque narrowing. Both renal arteries otherwise appear widely patent without aneurysm, dissection, vasculitis or features of fibromuscular dysplasia. IMA: Patent without evidence of aneurysm, dissection, vasculitis or significant stenosis. Inflow: Minimal atherosclerotic plaque throughout the inflow vasculature most pronounced in the common and internal segments. No flow-limiting stenosis or occlusion. No evidence of aneurysm, dissection or vasculitis. Included portion of the proximal outflow including the common, superficial and deep femoral arteries appear grossly unremarkable without acute luminal abnormality, significant stenosis or occlusion. Veins: No major venous abnormality within limitations of this arterial phase exam. Review of the MIP images confirms the above findings. NON-VASCULAR Hepatobiliary: Diffuse hepatic hypoattenuation possibly related to contrast timing or fatty infiltration. Gallbladder is mildly distended. Question some very faint pericholecystic inflammation is noted particular towards the fundus. Pancreas: No pancreatic ductal dilatation or surrounding inflammatory changes. Spleen:  Diminutive spleen, nonspecific.  No concerning splenic mass. Adrenals/Urinary Tract: Normal adrenals. Kidneys are normally located with symmetric enhancement. No suspicious renal lesion, urolithiasis or hydronephrosis. Bladder is unremarkable for the degree of distention. Stomach/Bowel: Distal esophagus and stomach are unremarkable. Large air and fluid-filled duodenal diverticulum without adjacent inflammation (7/178) measuring up to 3.8 cm in size. Normal duodenal sweep across the midline abdomen. No small bowel thickening or dilatation. Prior appendectomy. Extensive distal colonic diverticulosis and mild mural thickening of the affected segment of sigmoid colon albeit without focal inflammation to suggest an acute inflammatory process. No evidence of obstruction. Lymphatic: No suspicious or enlarged lymph nodes in the included lymphatic chains. Reproductive: Uterus is surgically absent. No concerning adnexal lesions. Other: No abdominopelvic free fluid or free gas. No bowel containing hernias. Musculoskeletal: No acute osseous abnormality or suspicious osseous lesion. Multilevel degenerative changes are present in the imaged portions of the spine. Retrolisthesis L2 on L3., anterolisthesis L5 on S1. Multilevel discogenic and facet degenerative changes appear grossly similar to comparison MR . Levocurvature of the lumbar spine with  pelvic tilt. Degenerative changes in the hips and pelvis. Review of the MIP images confirms the above findings. IMPRESSION: 1. No evidence of aortic dissection or other acute aortic syndrome. 2. Minimal atelectatic changes in the lungs. No other acute intrathoracic process. 3. Gallbladder distension with some faint pericholecystic stranding towards the fundus. Could reflect an early or developing acute cholecystitis. Correlate with serologies and further imaging with right upper quadrant ultrasound. 4. Large air-filled duodenal diverticulum without focal inflammation to suggest an  inflammatory change at this time. 5. Segmental thickening of the sigmoid in a region of numerous colonic diverticula without adjacent stranding or phlegmon. Can reflect sequela of prior inflammation or a more chronic smoldering inflammation such as segmental colitis associated with diverticulosis. Correlate with clinical symptoms and consider outpatient direct visualization if not recently performed 6. Biatrial enlargement and coronary artery atherosclerosis. 7. Aortic Atherosclerosis (ICD10-I70.0). Mild ostial plaque narrowing of the celiac axis, SMA and left renal artery origin. Electronically Signed   By: Lovena Le M.D.   On: 02/06/2021 01:38    EKG: Independently reviewed.  EKG shows normal sinus rhythm with biatrial enlargement.  Currently in A. fib with RVR with heart rate in the 1 30-1 40 range on monitor.  QTc prolonged at 498  Assessment/Plan Principal Problem:   GBS (Guillain Barre syndrome) Ms. Yacoub admitted to cardiac telemetry unit. Neurology has evaluated patient and ordered MRI C spine, T spine and L spine. IVIG ordered.  Will consider LP  Neurology to follow Consult PT for evaluation in am.  Active Problems:   Atrial fibrillation with RVR  Given dose of IV cardizem now. Resume daily metoprolol in am. Continue eliquis.  Check serial troponin level. Initial troponin was mildly elevated at 25.  Had echo in June 2022 which showed EF of 55% with no wall motion abnormalities and biatrial enlargement.  Neurology recommended decreasing metoprolol dose to 1/2 home dose.     Hyponatremia Mild hyponatremia of 129. Recheck electrolytes in am. If continues to be low will need further workup with serum osmolarity and urine sodium.  IVF hydration with LR at 75 ml/hr    Prolonged QT interval Avoid medications or could further prolong QT interval    Generalized weakness Consult PT in morning for evaluation Fall precautions. Up with assistance.    Chronic anticoagulation Continue  Eliquis for a-fib.    Abnormal CT gallbladder CT showed pericholecystic stranding which could represent early cholecystitis. Obtain Gallbladder U/S to evaluate further.    DVT prophylaxis: Is anticoagulated on Eliquis which is continued.   Code Status:   Full Code  Family Communication:  Diagnosis and plan discussed with patient.  She verbalized understanding agrees with plan.  Further recommendations to follow as clinical indicated Disposition Plan:   Patient is from:  Home Anticipated DC to:  Home.  There is a small chance patient will need rehab but will depend on clinicalcourse        during hospitalization  Anticipated DC date:  Anticipate 2 midnight or more stay  Anticipated DC barriers: Barriers to discharge identified at this time  Consults called:  Neurology  Admission status:  Inpatient   Yevonne Aline Saulo Anthis MD [facility]  How to contact the [facility] Attending or Consulting provider [place] or covering provider during after hours [place], for this patient?   Check the care team in [facility] and look for a) attending/consulting [facility] provider listed and b) the [facility] team listed Log into [email or web address] and use [place]'s  universal password to access. If you do not have the password, please contact the hospital operator. Locate the [facility] provider you are looking for under [facility] and page to a number that you can be directly reached. If you still have difficulty reaching the provider, please page the [facility] (Director on Call) for the Hospitalists listed on amion for assistance.  02/06/2021, 4:05 AM

## 2021-02-06 NOTE — ED Notes (Addendum)
Pt still in [place] at this time.

## 2021-02-06 NOTE — Telephone Encounter (Signed)
Outreach made to Dr. Lynnda Shields rounds this weekend.  He will check on Pt - Dr. Curt Bears Pt with recent ablation.  Advised son that Dr. Quentin Ore would round on Pt tomorrow.  Son was thankful for call back.

## 2021-02-06 NOTE — Progress Notes (Signed)
RT NOTE:  NIF: -24 VC: 1.24L  Best of 3, with good patient effort.

## 2021-02-06 NOTE — Progress Notes (Addendum)
PROGRESS NOTE    Angela Morales  ZOX:[id number] DOB: 06-28-1946 DOA: 02/05/2021 PCP: Deland Pretty, MD    Chief Complaint  Patient presents with   Weakness    Brief Narrative:   Is a no charge note as patient was seen and admitted earlier today by Dr. Ulyses Southward, patient was seen and examined, chart, imaging and labs were reviewed.   Angela Morales is a 75 y.o. female with medical history significant for  atrial fibrillation (on anticoagulation, recently s/p ablation on 8/3 after failed cardioversion on 10/30/2020), celiac disease, degenerative disc disease who presents with complaint of weakness and numbness in arms and legs.  Progressive over the last 3 days, concern for Guillain-Barr syndrome, LP be performed given patient is on Eliquis, seen by neurology, and recommendation for IVIG treatment.  Assessment & Plan:   Principal Problem:   GBS (Guillain Barre syndrome) ([place]) Active Problems:   Atrial fibrillation with RVR (HCC)   Chronic anticoagulation   Hyponatremia   Prolonged QT interval   Generalized weakness  Progressive sensory/motor impairment with high concern for Guillain-Barr syndrome -With progressive weakness over the last 3 days. -Clinical suspicion for Guillain-Barr by neurology, unable to have LP done given anticoagulation with Eliquis. -Started empirically on IVIG. -Continue to monitor respiratory status closely, NIF/FVC -Discussed with neurology, she needs to be at least 48 hours of anticoagulation before LP is done, so we will continue holding Eliquis, neurology will assess tomorrow to see if they felt LP still indicated(when she is already started on IVIG). -Consult PT/OT. -Treat with urinary retention, will continue with scan every 8 hours and in and out as needed    Atrial fibrillation with RVR  Given dose of IV cardizem now. Resume daily metoprolol in am. Continue to hold eliquis  Check serial troponin level. Initial troponin was mildly elevated at  25.  Had echo in June 2022 which showed EF of 55% with no wall motion abnormalities and biatrial enlargement.  Neurology recommended decreasing metoprolol dose to 1/2 home dose.   Addendum:  -Patient is back in A. fib with RVR, she will be started on Cardizem drip and heparin drip, she is with urinary retention spite multiple attempts of in and out, will insert Foley catheter as well.     Hyponatremia Mild hyponatremia of 129. Recheck electrolytes in am. If continues to be low will need further workup with serum osmolarity and urine sodium.  IVF hydration with LR at 75 ml/hr     Prolonged QT interval Avoid medications or could further prolong QT interval     Generalized weakness Consult PT in morning for evaluation Fall precautions. Up with assistance.     Chronic anticoagulation Continue Eliquis for a-fib.     Abnormal CT gallbladder CT showed pericholecystic stranding which could represent early cholecystitis. Obtain Gallbladder U/S to evaluate further    DVT prophylaxis: Eliquis on hold for possible need of LP Code Status: Full Family Communication:  Disposition:   Status is: Inpatient  Remains inpatient appropriate because:IV treatments appropriate due to intensity of illness or inability to take PO  Dispo: The patient is from: Home              Anticipated d/c is to:  pending PT               Patient currently is not medically stable to d/c.   Difficult to place patient No       Consultants:  neurology   Subjective:  Did have  some urinary retention this morning requiring in and out.  Objective: Vitals:   02/06/21 1300 02/06/21 1322 02/06/21 1346 02/06/21 1521  BP: (!) 146/93  (!) 145/98   Pulse: 82  82   Resp: 20  15   Temp:  97.7 F (36.5 C) 97.8 F (36.6 C)   TempSrc:  Oral Oral   SpO2: 97%  97% 96%    Intake/Output Summary (Last 24 hours) at 02/06/2021 1553 Last data filed at 02/06/2021 1108 Gross per 24 hour  Intake 1000 ml  Output 711 ml   Net 289 ml   There were no vitals filed for this visit.  Examination:  Awake Alert, Oriented X 3,  Symmetrical Chest wall movement, Good air movement bilaterally, CTAB RRR,No Gallops,Rubs or new Murmurs, No Parasternal Heave +ve B.Sounds, Abd Soft, No tenderness, No rebound - guarding or rigidity. No Cyanosis, Clubbing  .     Data Reviewed: I have personally reviewed following labs and imaging studies  CBC: Recent Labs  Lab 02/05/21 2251 02/06/21 0831  WBC 10.3 11.8*  NEUTROABS 8.3*  --   HGB 15.5* 14.5  HCT 46.1* 42.8  MCV 98.7 98.4  PLT 282 267    Basic Metabolic Panel: Recent Labs  Lab 02/05/21 2251 02/06/21 0831  NA 129* 129*  K 3.5 3.7  CL 96* 96*  CO2 24 23  GLUCOSE 138* 120*  BUN 5* 6*  CREATININE 0.46 0.41*  CALCIUM 9.4 8.8*    GFR: Estimated Creatinine Clearance: 52.5 mL/min (A) (by C-G formula based on SCr of 0.41 mg/dL (L)).  Liver Function Tests: No results for input(s): AST, ALT, ALKPHOS, BILITOT, PROT, ALBUMIN in the last 168 hours.  CBG: Recent Labs  Lab 02/06/21 0002  GLUCAP 129*     Recent Results (from the past 240 hour(s))  MRSA Next Gen by PCR, Nasal     Status: None   Collection Time: 02/06/21  2:14 PM   Specimen: Nasopharyngeal Swab; Nasal Swab  Result Value Ref Range Status   MRSA by PCR Next Gen NOT DETECTED NOT DETECTED Final    Comment: (NOTE) The GeneXpert MRSA Assay (FDA approved for NASAL specimens only), is one component of a comprehensive MRSA colonization surveillance program. It is not intended to diagnose MRSA infection nor to guide or monitor treatment for MRSA infections. Test performance is not FDA approved in patients less than 36 years old. Performed at [facility], [place] [address] [place], [place] [address]          Radiology Studies: MR CERVICAL SPINE W WO CONTRAST  Result Date: 02/06/2021 CLINICAL DATA:  75 year old female with progressive weakness and numbness in the extremities over  the past 3 days. Ascending weakness and now unable to walk. 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 Postoperative day 10 status post cardiac ablation for atrial fibrillation. EXAM: MRI TOTAL SPINE WITHOUT AND WITH CONTRAST TECHNIQUE: Multisequence MR imaging of the spine from the cervical spine to the sacrum was performed prior to and following IV contrast administration for evaluation of spinal metastatic disease. CONTRAST:  6.16m GADAVIST GADOBUTROL 1 MMOL/ML IV SOLN COMPARISON:  Lumbar MRI 09/08/2020. FINDINGS: MRI CERVICAL SPINE FINDINGS Alignment: Anterolisthesis of C4 on C5 measures 3 mm. Subtle anterolisthesis of C2 on C3, C7 on T1. Otherwise mild straightening of cervical lordosis. Vertebrae: Chronic degenerative marrow signal changes in the bilateral cervical facets, lower cervical spine endplates. No marrow edema or evidence of acute osseous abnormality. Cord: Normal. No cervical spinal cord signal abnormality despite up to mild  degenerative spinal cord mass effect detailed below. Following contrast there is no convincing abnormal enhancement. No cervical spine dural thickening. Posterior Fossa, vertebral arteries, paraspinal tissues: Cervicomedullary junction is within normal limits. Negative visible posterior fossa. Negative for age other visible brain parenchyma. Preserved major vascular flow voids in the neck. Dominant appearing right vertebral artery. Prominent paravertebral venous system appears to be incidental, physiologic. Negative visible neck soft tissues and lung apices. Disc levels: C2-C3: Moderate to severe facet hypertrophy on the right but no marrow edema. Trace facet joint fluid. Mild disc bulging/pseudo disc. No spinal stenosis. Severe right C3 foraminal stenosis. C3-C4: Mild to moderate facet hypertrophy. But no significant stenosis. C4-C5: Anterolisthesis with disc/pseudo disc bulging. Moderate to severe facet hypertrophy greater on the right. No spinal stenosis. Moderate right C5 foraminal stenosis.  C5-C6: Disc space loss. Circumferential disc osteophyte complex eccentric to the left. Mild facet and ligament flavum hypertrophy greater on the left. Effaced ventral CSF space and borderline to mild spinal stenosis. Severe left C6 foraminal stenosis. C6-C7: Disc space loss with circumferential disc osteophyte complex eccentric to the left. Moderate left facet hypertrophy. Effaced ventral CSF space but no spinal stenosis. Moderate to severe left and mild right C7 foraminal stenosis. C7-T1: Subtle anterolisthesis. Negative disc. Mild to moderate facet hypertrophy. No stenosis. MRI THORACIC SPINE FINDINGS Segmentation: Hypoplastic ribs suspected at T12.  Otherwise normal. Alignment: Mildly exaggerated upper thoracic kyphosis. There is subtle anterolisthesis of T10 on T11. Vertebrae: No marrow edema or evidence of acute osseous abnormality. Visualized bone marrow signal is within normal limits. Preserved thoracic vertebral height. Cord: Normal. Capacious thoracic spinal canal at most levels. No abnormal intradural enhancement. No dural thickening. The conus medullaris appears normal at T12-L1. Paraspinal and other soft tissues: Negative. Disc levels: T1-T2: Mild to moderate facet hypertrophy but no significant stenosis. T2-T3: Mild facet hypertrophy greater on the left. Mild left T2 foraminal stenosis. T3-T4: Mild to moderate facet hypertrophy greater on the right. Mild right T3 foraminal stenosis. T4-T5: Mild to moderate facet hypertrophy greater on the right. No significant stenosis. T5-T6: Mild to moderate facet hypertrophy greater on the left. No stenosis. T6-T7: Mild to moderate left facet hypertrophy and moderate left T6 neural foraminal stenosis. Small right T6 nerve root diverticulum. T7-T8: Negative; small right T7 nerve root diverticulum. T8-T9: Negative.  Small left T8 nerve root diverticulum. T9-T10: Negative. T10-T11: Mild anterolisthesis. Negative disc. Moderate facet hypertrophy greater on the left with  trace facet joint fluid. Mild ligament flavum hypertrophy. Moderate left T10 foraminal stenosis. No spinal stenosis. T11-T12: Negative. T12-L1: Negative. MRI LUMBAR SPINE FINDINGS Segmentation:  Normal, concordant with the above numbering. Alignment: Moderate levoconvex upper and dextroconvex lower lumbar scoliosis best demonstrated on coronal imaging included with the March MRI. Associated mild retrolisthesis at both L1-L2 and L2-L3 is stable. Mild associated anterolisthesis of L5 on S1 is stable. Vertebrae: Patchy and confluent degenerative appearing marrow edema eccentric to the left at the L1 through L4 levels is stable since March. Continued faint degenerative left L5-S1 endplate marrow edema also. No new or suspicious marrow lesion identified. Intact visible sacrum and SI joints. Conus medullaris: Extends to the T12-L1 level and appears normal. There is no thickening or enhancement of the cauda equina nerve roots, which appear stable on axial T2 imaging since March. No abnormal intradural enhancement or dural thickening. Paraspinal and other soft tissues: Partially visible distended urinary bladder, at least 350 mL estimated volume. Severe diverticulosis of large bowel in the pelvis. No pelvic inflammation is evident. Negative  visible abdominal viscera. Disc levels: Advanced lumbar spine disc and endplate degeneration from L1-L2 through L5-S1 appears stable since March, notable for - mild to moderate right lateral recess stenosis and right foraminal stenosis at L1-L2 (right L1 and L2 nerve levels), - mild to moderate bilateral foraminal stenosis at L2 (bilateral L2 nerve levels), - moderate to severe left lateral recess stenosis at L3-L4 with moderate spinal stenosis and moderate to severe right foraminal stenosis right L3 and left L4 nerve levels). -moderate to severe left foraminal stenosis at L4-L5 (left L4 nerve level). -moderate to severe left foraminal stenosis at L5-S1 (left L5 nerve level). IMPRESSION:  1. Normal spinal cord. The conus medullaris and cauda equina nerve roots appear stable since March and within normal limits. No imaging evidence of Guillain-Barre or spinal cord demyelination. 2. Advanced lumbar spine degeneration in the setting of moderate scoliosis is stable since a March MRI, including patchy lumbar vertebral body marrow edema, moderate multifactorial spinal stenosis at L3-L4, intermittent lateral recess and occasionally moderate to severe lumbar neural foraminal stenosis (left L4 and L5 nerve levels). 3. Generally mild for age thoracic spine degeneration, largely facet arthropathy. No thoracic spinal stenosis. Up to moderate associated left T6 and left T10 neural foraminal stenosis. 4. 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 5. Distended urinary bladder suspicious for Urinary Retention. Extensive diverticulosis of large bowel in the pelvis but no active inflammation. Electronically Signed   By: Genevie Ann M.D.   On: 02/06/2021 06:46   MR THORACIC SPINE W WO CONTRAST  Result Date: 02/06/2021 CLINICAL DATA:  75 year old female with progressive weakness and numbness in the extremities over the past 3 days. Ascending weakness and now unable to walk. 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 Postoperative day 10 status post cardiac ablation for atrial fibrillation. EXAM: MRI TOTAL SPINE WITHOUT AND WITH CONTRAST TECHNIQUE: Multisequence MR imaging of the spine from the cervical spine to the sacrum was performed prior to and following IV contrast administration for evaluation of spinal metastatic disease. CONTRAST:  6.32m GADAVIST GADOBUTROL 1 MMOL/ML IV SOLN COMPARISON:  Lumbar MRI 09/08/2020. FINDINGS: MRI CERVICAL SPINE FINDINGS Alignment: Anterolisthesis of C4 on C5 measures 3 mm. Subtle anterolisthesis of C2 on C3, C7 on T1. Otherwise  mild straightening of cervical lordosis. Vertebrae: Chronic degenerative marrow signal changes in the bilateral cervical facets, lower cervical spine endplates. No marrow edema or evidence of acute osseous abnormality. Cord: Normal. No cervical spinal cord signal abnormality despite up to mild degenerative spinal cord mass effect detailed below. Following contrast there is no convincing abnormal enhancement. No cervical spine dural thickening. Posterior Fossa, vertebral arteries, paraspinal tissues: Cervicomedullary junction is within normal limits. Negative visible posterior fossa. Negative for age other visible brain parenchyma. Preserved major vascular flow voids in the neck. Dominant appearing right vertebral artery. Prominent paravertebral venous system appears to be incidental, physiologic. Negative visible neck soft tissues and lung apices. Disc levels: C2-C3: Moderate to severe facet hypertrophy on the right but no marrow edema. Trace facet joint fluid. Mild disc bulging/pseudo disc. No spinal stenosis. Severe right C3 foraminal stenosis. C3-C4: Mild to moderate facet hypertrophy. But no significant stenosis. C4-C5: Anterolisthesis with disc/pseudo disc bulging. Moderate to severe facet hypertrophy greater on the right. No spinal stenosis. Moderate right C5 foraminal stenosis. C5-C6: Disc space loss. Circumferential disc osteophyte complex eccentric to the left. Mild facet  and ligament flavum hypertrophy greater on the left. Effaced ventral CSF space and borderline to mild spinal stenosis. Severe left C6 foraminal stenosis. C6-C7: Disc space loss with circumferential disc osteophyte complex eccentric to the left. Moderate left facet hypertrophy. Effaced ventral CSF space but no spinal stenosis. Moderate to severe left and mild right C7 foraminal stenosis. C7-T1: Subtle anterolisthesis. Negative disc. Mild to moderate facet hypertrophy. No stenosis. MRI THORACIC SPINE FINDINGS Segmentation: Hypoplastic ribs  suspected at T12.  Otherwise normal. Alignment: Mildly exaggerated upper thoracic kyphosis. There is subtle anterolisthesis of T10 on T11. Vertebrae: No marrow edema or evidence of acute osseous abnormality. Visualized bone marrow signal is within normal limits. Preserved thoracic vertebral height. Cord: Normal. Capacious thoracic spinal canal at most levels. No abnormal intradural enhancement. No dural thickening. The conus medullaris appears normal at T12-L1. Paraspinal and other soft tissues: Negative. Disc levels: T1-T2: Mild to moderate facet hypertrophy but no significant stenosis. T2-T3: Mild facet hypertrophy greater on the left. Mild left T2 foraminal stenosis. T3-T4: Mild to moderate facet hypertrophy greater on the right. Mild right T3 foraminal stenosis. T4-T5: Mild to moderate facet hypertrophy greater on the right. No significant stenosis. T5-T6: Mild to moderate facet hypertrophy greater on the left. No stenosis. T6-T7: Mild to moderate left facet hypertrophy and moderate left T6 neural foraminal stenosis. Small right T6 nerve root diverticulum. T7-T8: Negative; small right T7 nerve root diverticulum. T8-T9: Negative.  Small left T8 nerve root diverticulum. T9-T10: Negative. T10-T11: Mild anterolisthesis. Negative disc. Moderate facet hypertrophy greater on the left with trace facet joint fluid. Mild ligament flavum hypertrophy. Moderate left T10 foraminal stenosis. No spinal stenosis. T11-T12: Negative. T12-L1: Negative. MRI LUMBAR SPINE FINDINGS Segmentation:  Normal, concordant with the above numbering. Alignment: Moderate levoconvex upper and dextroconvex lower lumbar scoliosis best demonstrated on coronal imaging included with the March MRI. Associated mild retrolisthesis at both L1-L2 and L2-L3 is stable. Mild associated anterolisthesis of L5 on S1 is stable. Vertebrae: Patchy and confluent degenerative appearing marrow edema eccentric to the left at the L1 through L4 levels is stable since  March. Continued faint degenerative left L5-S1 endplate marrow edema also. No new or suspicious marrow lesion identified. Intact visible sacrum and SI joints. Conus medullaris: Extends to the T12-L1 level and appears normal. There is no thickening or enhancement of the cauda equina nerve roots, which appear stable on axial T2 imaging since March. No abnormal intradural enhancement or dural thickening. Paraspinal and other soft tissues: Partially visible distended urinary bladder, at least 350 mL estimated volume. Severe diverticulosis of large bowel in the pelvis. No pelvic inflammation is evident. Negative visible abdominal viscera. Disc levels: Advanced lumbar spine disc and endplate degeneration from L1-L2 through L5-S1 appears stable since March, notable for - mild to moderate right lateral recess stenosis and right foraminal stenosis at L1-L2 (right L1 and L2 nerve levels), - mild to moderate bilateral foraminal stenosis at L2 (bilateral L2 nerve levels), - moderate to severe left lateral recess stenosis at L3-L4 with moderate spinal stenosis and moderate to severe right foraminal stenosis right L3 and left L4 nerve levels). -moderate to severe left foraminal stenosis at L4-L5 (left L4 nerve level). -moderate to severe left foraminal stenosis at L5-S1 (left L5 nerve level). IMPRESSION: 1. Normal spinal cord. The conus medullaris and cauda equina nerve roots appear stable since March and within normal limits. No imaging evidence of Guillain-Barre or spinal cord demyelination. 2. Advanced lumbar spine degeneration in the setting of moderate scoliosis is stable since  a March MRI, including patchy lumbar vertebral body marrow edema, moderate multifactorial spinal stenosis at L3-L4, intermittent lateral recess and occasionally moderate to severe lumbar neural foraminal stenosis (left L4 and L5 nerve levels). 3. Generally mild for age thoracic spine degeneration, largely facet arthropathy. No thoracic spinal  stenosis. Up to moderate associated left T6 and left T10 neural foraminal stenosis. 4. 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 5. Distended urinary bladder suspicious for Urinary Retention. Extensive diverticulosis of large bowel in the pelvis but no active inflammation. Electronically Signed   By: Genevie Ann M.D.   On: 02/06/2021 06:46   MR Lumbar Spine W Wo Contrast  Result Date: 02/06/2021 CLINICAL DATA:  75 year old female with progressive weakness and numbness in the extremities over the past 3 days. Ascending weakness and now unable to walk. 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 Postoperative day 10 status post cardiac ablation for atrial fibrillation. EXAM: MRI TOTAL SPINE WITHOUT AND WITH CONTRAST TECHNIQUE: Multisequence MR imaging of the spine from the cervical spine to the sacrum was performed prior to and following IV contrast administration for evaluation of spinal metastatic disease. CONTRAST:  6.12m GADAVIST GADOBUTROL 1 MMOL/ML IV SOLN COMPARISON:  Lumbar MRI 09/08/2020. FINDINGS: MRI CERVICAL SPINE FINDINGS Alignment: Anterolisthesis of C4 on C5 measures 3 mm. Subtle anterolisthesis of C2 on C3, C7 on T1. Otherwise mild straightening of cervical lordosis. Vertebrae: Chronic degenerative marrow signal changes in the bilateral cervical facets, lower cervical spine endplates. No marrow edema or evidence of acute osseous abnormality. Cord: Normal. No cervical spinal cord signal abnormality despite up to mild degenerative spinal cord mass effect detailed below. Following contrast there is no convincing abnormal enhancement. No cervical spine dural thickening. Posterior Fossa, vertebral arteries, paraspinal tissues: Cervicomedullary junction is within normal limits. Negative visible posterior fossa. Negative for age other visible brain  parenchyma. Preserved major vascular flow voids in the neck. Dominant appearing right vertebral artery. Prominent paravertebral venous system appears to be incidental, physiologic. Negative visible neck soft tissues and lung apices. Disc levels: C2-C3: Moderate to severe facet hypertrophy on the right but no marrow edema. Trace facet joint fluid. Mild disc bulging/pseudo disc. No spinal stenosis. Severe right C3 foraminal stenosis. C3-C4: Mild to moderate facet hypertrophy. But no significant stenosis. C4-C5: Anterolisthesis with disc/pseudo disc bulging. Moderate to severe facet hypertrophy greater on the right. No spinal stenosis. Moderate right C5 foraminal stenosis. C5-C6: Disc space loss. Circumferential disc osteophyte complex eccentric to the left. Mild facet and ligament flavum hypertrophy greater on the left. Effaced ventral CSF space and borderline to mild spinal stenosis. Severe left C6 foraminal stenosis. C6-C7: Disc space loss with circumferential disc osteophyte complex eccentric to the left. Moderate left facet hypertrophy. Effaced ventral CSF space but no spinal stenosis. Moderate to severe left and mild right C7 foraminal stenosis. C7-T1: Subtle anterolisthesis. Negative disc. Mild to moderate facet hypertrophy. No stenosis. MRI THORACIC SPINE FINDINGS Segmentation: Hypoplastic ribs suspected at T12.  Otherwise normal. Alignment: Mildly exaggerated upper thoracic kyphosis. There is subtle anterolisthesis of T10 on T11. Vertebrae: No marrow edema or evidence of acute osseous abnormality. Visualized bone marrow signal is within normal limits. Preserved thoracic vertebral height. Cord: Normal. Capacious thoracic spinal canal at most levels. No abnormal intradural enhancement. No dural thickening. The conus medullaris appears normal at T12-L1. Paraspinal and other soft tissues: Negative. Disc levels: T1-T2:  Mild to moderate facet hypertrophy but no significant stenosis. T2-T3: Mild facet hypertrophy  greater on the left. Mild left T2 foraminal stenosis. T3-T4: Mild to moderate facet hypertrophy greater on the right. Mild right T3 foraminal stenosis. T4-T5: Mild to moderate facet hypertrophy greater on the right. No significant stenosis. T5-T6: Mild to moderate facet hypertrophy greater on the left. No stenosis. T6-T7: Mild to moderate left facet hypertrophy and moderate left T6 neural foraminal stenosis. Small right T6 nerve root diverticulum. T7-T8: Negative; small right T7 nerve root diverticulum. T8-T9: Negative.  Small left T8 nerve root diverticulum. T9-T10: Negative. T10-T11: Mild anterolisthesis. Negative disc. Moderate facet hypertrophy greater on the left with trace facet joint fluid. Mild ligament flavum hypertrophy. Moderate left T10 foraminal stenosis. No spinal stenosis. T11-T12: Negative. T12-L1: Negative. MRI LUMBAR SPINE FINDINGS Segmentation:  Normal, concordant with the above numbering. Alignment: Moderate levoconvex upper and dextroconvex lower lumbar scoliosis best demonstrated on coronal imaging included with the March MRI. Associated mild retrolisthesis at both L1-L2 and L2-L3 is stable. Mild associated anterolisthesis of L5 on S1 is stable. Vertebrae: Patchy and confluent degenerative appearing marrow edema eccentric to the left at the L1 through L4 levels is stable since March. Continued faint degenerative left L5-S1 endplate marrow edema also. No new or suspicious marrow lesion identified. Intact visible sacrum and SI joints. Conus medullaris: Extends to the T12-L1 level and appears normal. There is no thickening or enhancement of the cauda equina nerve roots, which appear stable on axial T2 imaging since March. No abnormal intradural enhancement or dural thickening. Paraspinal and other soft tissues: Partially visible distended urinary bladder, at least 350 mL estimated volume. Severe diverticulosis of large bowel in the pelvis. No pelvic inflammation is evident. Negative visible  abdominal viscera. Disc levels: Advanced lumbar spine disc and endplate degeneration from L1-L2 through L5-S1 appears stable since March, notable for - mild to moderate right lateral recess stenosis and right foraminal stenosis at L1-L2 (right L1 and L2 nerve levels), - mild to moderate bilateral foraminal stenosis at L2 (bilateral L2 nerve levels), - moderate to severe left lateral recess stenosis at L3-L4 with moderate spinal stenosis and moderate to severe right foraminal stenosis right L3 and left L4 nerve levels). -moderate to severe left foraminal stenosis at L4-L5 (left L4 nerve level). -moderate to severe left foraminal stenosis at L5-S1 (left L5 nerve level). IMPRESSION: 1. Normal spinal cord. The conus medullaris and cauda equina nerve roots appear stable since March and within normal limits. No imaging evidence of Guillain-Barre or spinal cord demyelination. 2. Advanced lumbar spine degeneration in the setting of moderate scoliosis is stable since a March MRI, including patchy lumbar vertebral body marrow edema, moderate multifactorial spinal stenosis at L3-L4, intermittent lateral recess and occasionally moderate to severe lumbar neural foraminal stenosis (left L4 and L5 nerve levels). 3. Generally mild for age thoracic spine degeneration, largely facet arthropathy. No thoracic spinal stenosis. Up to moderate associated left T6 and left T10 neural foraminal stenosis. 4. 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 5. Distended urinary bladder suspicious for Urinary Retention. Extensive diverticulosis of large bowel in the pelvis but no active inflammation. Electronically Signed   By: Genevie Ann M.D.   On: 02/06/2021 06:46   DG Chest Port 1 View  Result Date: 02/05/2021 CLINICAL DATA:  Chest pain EXAM: PORTABLE CHEST 1  VIEW COMPARISON:   03/08/2016 FINDINGS: Mild hyperinflation. Heart and mediastinal contours are within normal limits. No focal opacities or effusions. No acute bony abnormality. Aortic atherosclerosis. IMPRESSION: Hyperinflation. No active disease. Electronically Signed   By: Rolm Baptise M.D.   On: 02/05/2021 23:30   CT Angio Chest/Abd/Pel for Dissection W and/or Wo Contrast  Result Date: 02/06/2021 CLINICAL DATA:  Chest and back pain, dissection suspected EXAM: CT ANGIOGRAPHY CHEST, ABDOMEN AND PELVIS TECHNIQUE: Non-contrast CT of the chest was initially obtained. Multidetector CT imaging through the chest, abdomen and pelvis was performed using the standard protocol during bolus administration of intravenous contrast. Multiplanar reconstructed images and MIPs were obtained and reviewed to evaluate the vascular anatomy. CONTRAST:  173m OMNIPAQUE IOHEXOL 350 MG/ML SOLN COMPARISON:  Lumbar MRI 09/08/2020 FINDINGS: CTA CHEST FINDINGS Cardiovascular: Initial noncontrast CT imaging through the chest reveals an atherosclerotic thoracic aorta without hyperdense mural thickening or plaque displacement to suggest intramural hematoma. Postcontrast administration there is satisfactory arterial phase enhancement for assessment of the arterial vasculature. Insert air aortic root The aorta is normal caliber. No acute luminal abnormality of the imaged aorta. No periaortic stranding or hemorrhage. 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 Proximal great vessels are otherwise unremarkable and free of acute abnormality. Biatrial enlargement. Otherwise normal cardiac size. No pericardial effusion. Coronary atherosclerosis. Central pulmonary arteries are normal caliber. No large central or lobar pulmonary arterial filling defects with more distal evaluation limited by this non tailored exam. No major venous abnormalities. Mediastinum/Nodes: No mediastinal fluid or gas. Normal thyroid gland and  thoracic inlet. No acute abnormality of the trachea or thoracic esophagus. No worrisome mediastinal, hilar or axillary adenopathy. Lungs/Pleura: Airways patent. Atelectatic changes. No consolidation, features of edema, pneumothorax, or effusion. No suspicious pulmonary nodules or masses. Mild dependent atelectasis. Musculoskeletal: No acute osseous abnormality or suspicious osseous lesion. Multilevel degenerative changes are present in the imaged portions of the spine. Additional degenerative changes in the shoulders. No worrisome chest wall mass or lesion. Review of the MIP images confirms the above findings. CTA ABDOMEN AND PELVIS FINDINGS VASCULAR Aorta: Calcified noncalcified atheromatous plaque in the normal caliber aorta without aneurysm, dissection, vasculitis or significant stenosis. Celiac: Mild ostial plaque narrowing. Otherwise widely patent without evidence of aneurysm, dissection, vasculitis or significant stenosis. SMA: Mild ostial plaque narrowing. Otherwise patent without evidence of aneurysm, dissection, vasculitis or significant stenosis. Renals: Single renal arteries bilaterally. At most mild left ostial plaque narrowing. Both renal arteries otherwise appear widely patent without aneurysm, dissection, vasculitis or features of fibromuscular dysplasia. IMA: Patent without evidence of aneurysm, dissection, vasculitis or significant stenosis. Inflow: Minimal atherosclerotic plaque throughout the inflow vasculature most pronounced in the common and internal segments. No flow-limiting stenosis or occlusion. No evidence of aneurysm, dissection or vasculitis. Included portion of the proximal outflow including the common, superficial and deep femoral arteries appear grossly unremarkable without acute luminal abnormality, significant stenosis or occlusion. Veins: No major venous abnormality within limitations of this arterial phase exam. Review of the MIP images confirms the above findings. NON-VASCULAR  Hepatobiliary: Diffuse hepatic hypoattenuation possibly related to contrast timing or fatty infiltration. Gallbladder is mildly distended. Question some very faint pericholecystic inflammation is noted particular towards the fundus. Pancreas: No pancreatic ductal dilatation or surrounding inflammatory changes. Spleen: Diminutive spleen, nonspecific.  No concerning splenic mass. Adrenals/Urinary Tract: Normal adrenals. Kidneys are normally located with symmetric enhancement. No suspicious renal lesion, urolithiasis or hydronephrosis. Bladder is unremarkable for the degree of distention. Stomach/Bowel: Distal  esophagus and stomach are unremarkable. Large air and fluid-filled duodenal diverticulum without adjacent inflammation (7/178) measuring up to 3.8 cm in size. Normal duodenal sweep across the midline abdomen. No small bowel thickening or dilatation. Prior appendectomy. Extensive distal colonic diverticulosis and mild mural thickening of the affected segment of sigmoid colon albeit without focal inflammation to suggest an acute inflammatory process. No evidence of obstruction. Lymphatic: No suspicious or enlarged lymph nodes in the included lymphatic chains. Reproductive: Uterus is surgically absent. No concerning adnexal lesions. Other: No abdominopelvic free fluid or free gas. No bowel containing hernias. Musculoskeletal: No acute osseous abnormality or suspicious osseous lesion. Multilevel degenerative changes are present in the imaged portions of the spine. Retrolisthesis L2 on L3., anterolisthesis L5 on S1. Multilevel discogenic and facet degenerative changes appear grossly similar to comparison MR . Levocurvature of the lumbar spine with pelvic tilt. Degenerative changes in the hips and pelvis. Review of the MIP images confirms the above findings. IMPRESSION: 1. No evidence of aortic dissection or other acute aortic syndrome. 2. Minimal atelectatic changes in the lungs. No other acute intrathoracic process.  3. Gallbladder distension with some faint pericholecystic stranding towards the fundus. Could reflect an early or developing acute cholecystitis. Correlate with serologies and further imaging with right upper quadrant ultrasound. 4. Large air-filled duodenal diverticulum without focal inflammation to suggest an inflammatory change at this time. 5. Segmental thickening of the sigmoid in a region of numerous colonic diverticula without adjacent stranding or phlegmon. Can reflect sequela of prior inflammation or a more chronic smoldering inflammation such as segmental colitis associated with diverticulosis. Correlate with clinical symptoms and consider outpatient direct visualization if not recently performed 6. Biatrial enlargement and coronary artery atherosclerosis. 7. Aortic Atherosclerosis (ICD10-I70.0). Mild ostial plaque narrowing of the celiac axis, SMA and left renal artery origin. Electronically Signed   By: Lovena Le M.D.   On: 02/06/2021 01:38   US Abdomen Limited RUQ (LIVER/GB)  Result Date: 02/06/2021 CLINICAL DATA:  Abnormal CT with distended gallbladder and slight pericholecystic stranding. EXAM: ULTRASOUND ABDOMEN LIMITED RIGHT UPPER QUADRANT COMPARISON:  CT head Nov 06, 2020 FINDINGS: Gallbladder: Distended gallbladder. No gallbladder wall thickening or pericholecystic fluid. Negative for gallstones. Negative sonographic Murphy sign Common bile duct: Diameter: 6.3 mm. Liver: Mild increased echogenicity liver diffusely without focal liver lesion. Portal vein is patent on color Doppler imaging with normal direction of blood flow towards the liver. Other: None. IMPRESSION: Distended gallbladder without gallstones or pericholecystic fluid. No evidence of cholecystitis. No biliary dilatation. Electronically Signed   By: Franchot Gallo M.D.   On: 02/06/2021 07:47        Scheduled Meds:  latanoprost  1 drop Left Eye QHS   metoprolol succinate  12.5 mg Oral Daily   timolol  1 drop Left Eye BID    Continuous Infusions:  Immune Globulin 10%     lactated ringers 75 mL/hr at 02/06/21 0837   sodium chloride Stopped (02/06/21 0833)     LOS: 0 days       Phillips Climes, MD [facility]   To contact the attending provider between 7A-7P or the covering provider during after hours 7P-7A, please log into the web site [email or web address] and access using universal [place] password for that web site. If you do not have the password, please call the hospital operator.  02/06/2021, 3:53 PM

## 2021-02-06 NOTE — Progress Notes (Signed)
RT note- NIF-33 FVC-1.88 L Sitting upright in bed, good effort.

## 2021-02-06 NOTE — ED Notes (Signed)
Patient transported to MRI 

## 2021-02-06 NOTE — Consult Note (Signed)
Neurology Consultation Reason for Consult: Ascending weakness/numbness and c/f areflexia  Requesting Physician: Veryl Speak  CC: Progressive ascending weakness and numbness   History is obtained from: Patient and chart review  HPI: Angela Morales is a 75 y.o. left handed woman with a past medical history significant for atrial fibrillation (on anticoagulation, recently s/p ablation on 8/3 after failed cardioversion on 10/30/2020), celiac disease, degenerative disc disease, glaucoma, supraventricular tachycardia, severe lumbar scoliosis.  She reports that after her ablation procedure she did have some shortness of breath and chest and back pain that improved gradually over 3 days.  She is still been having intermittent episodes of palpitations with irregular heart rate and had a low-grade fever of 100.3 the first day postop which resolved.  She denies any other infectious symptoms recently.  She reports her last vaccination was a COVID 19 booster several months ago.  On Monday 8/8 she began to notice some change in her taste and on Tuesday afternoon she started to notice tingling in her fingers and toes.  When she woke on Wednesday morning she had difficulty lifting her legs to dress and buttoning her buttons with her hands.  By Thursday she could no longer walk without the support of her family and had severe difficulties with balance and coordination, prompting presentation to the ED for further evaluation.  At this time she reports that her sensory changes are up to the knee and her left leg and up to the calf and her right leg, below the elbows and bilateral arms with a little more numbness in the right compared to left.  She notes that her neurosurgeon recently started gabapentin 2 to 3 weeks prior to the ablation for her back pain due to scoliosis that is inoperable given its location.  She has felt very sleepy with this medication and so has only been taking it nightly instead of 3 times daily,  but did start a morning dose approximately 1 week ago.  Otherwise has had no recent changes in her medications.  ROS: All other review of systems was negative except as noted in the HPI.   Past Medical History:  Diagnosis Date   Abnormal uterine bleeding    on HRT   Allergy    seasonal   Arthritis    Atrial fibrillation (HCC)    Celiac disease    DDD (degenerative disc disease)    Dysrhythmia    Glaucoma    Microscopic colitis    Osteoarthritis of both knees    PONV (postoperative nausea and vomiting)    SVT (supraventricular tachycardia) ([place])    Past Surgical History:  Procedure Laterality Date   APPENDECTOMY     ATRIAL FIBRILLATION ABLATION N/A 01/28/2021   Procedure: ATRIAL FIBRILLATION ABLATION;  Surgeon: Constance Haw, MD;  Location: [place] [facility];  Service: Cardiovascular;  Laterality: N/A;   CARDIOVERSION N/A 10/30/2020   Procedure: CARDIOVERSION;  Surgeon: Jerline Pain, MD;  Location: [facility];  Service: Cardiovascular;  Laterality: N/A;   COLONOSCOPY     FOOT FRACTURE SURGERY  10/2013   TONSILLECTOMY AND ADENOIDECTOMY     TOTAL KNEE ARTHROPLASTY Left 03/22/2016   Procedure: TOTAL KNEE ARTHROPLASTY;  Surgeon: Dorna Leitz, MD;  Location: [place];  Service: Orthopedics;  Laterality: Left;   TUBAL LIGATION  1981   VAGINAL HYSTERECTOMY  04/20/05   prolapse, adenomyosis   Current Outpatient Medications  Medication Instructions   apixaban (ELIQUIS) 5 MG TABS tablet TAKE 1 TABLET BY MOUTH TWICE  A DAY   gabapentin (NEURONTIN) 300 mg, Oral, Daily at bedtime   ibuprofen (ADVIL) 400 mg, Oral, Every 6 hours PRN   loratadine (CLARITIN) 10 mg, Oral, Daily PRN   metoprolol succinate (TOPROL-XL) 25 MG 24 hr tablet TAKE 1 TABLET BY MOUTH EVERY DAY   Polyethyl Glycol-Propyl Glycol (SYSTANE) 0.4-0.3 % SOLN 1-2 drops, Both Eyes, 3 times daily PRN   psyllium (METAMUCIL) 58.6 % packet 1 packet, Oral, Every morning   timolol (TIMOPTIC) 0.5 % ophthalmic solution 1 drop, Left  Eye, 2 times daily   ZIOPTAN 0.0015 % SOLN 1 drop, Left Eye, Nightly     Family History  Problem Relation Age of Onset   Osteoporosis Mother    Stroke Mother        mini   Prostate cancer Father    Heart disease Brother 73       shunt put in heart   Osteoarthritis Maternal Grandmother    Bipolar disorder Son    Liver disease Son    Breast cancer Cousin        1st cousin   Colon cancer Neg Hx     Social History:  reports that she has never smoked. She has never used smokeless tobacco. She reports current alcohol use of about 4.0 - 6.0 standard drinks per week. She reports that she does not use drugs.   Exam: Current vital signs: BP (!) 172/98   Pulse 80   Temp 97.6 F (36.4 C)   Resp 12   LMP 04/20/2005   SpO2 95%  Vital signs in last 24 hours: Temp:  [97.6 F (36.4 C)] 97.6 F (36.4 C) (08/11 2255) Pulse Rate:  [79-81] 80 (08/12 0126) Resp:  [12-18] 12 (08/12 0126) BP: (153-185)/(98-134) 172/98 (08/12 0126) SpO2:  [95 %-99 %] 95 % (08/12 0126)   Physical Exam  Constitutional: Appears well-developed and well-nourished.  Psych: Affect appropriate to situation, mildly anxious, comfortable  Eyes: No scleral injection HENT: No oropharyngeal obstruction.  MSK: no joint deformities.  Cardiovascular: Normal rate and regular rhythm.  Respiratory: Effort normal, non-labored breathing GI: Soft.  No distension. There is no tenderness.  Skin: Warm dry and intact visible skin  Neuro: Mental Status: Patient is awake, alert, oriented to person, place, month, year, and situation. Patient is able to give a clear and coherent history. No signs of aphasia or neglect Cranial Nerves: II: Visual Fields are full. Pupils are equal, round, and reactive to light.   III,IV, VI: EOMI without ptosis or diploplia.  V: Facial sensation is symmetric to temperature VII: Facial movement is symmetric.  Able to retain air in both of her cheeks VIII: hearing is intact to voice X: Uvula  elevates symmetrically XI: Shoulder shrug is symmetric. XII: tongue is midline without atrophy or fasciculations.  Motor: Tone is normal. Bulk is normal.  However there is neck flexion weakness 3/5, bilateral upper extremities are 4/5 throughout, slightly weaker with extension than flexion.  Bilateral lower extremity hip flexion is 2/5.  kee extension is 5 on the right and 4 on the left, knee flexion is 4 on the right and 4 - on the left, foot dorsiflexion is 4 bilaterally. Single breath test was 25 Sensory: Sensation is notable for reduced proprioception even at the fingertips, nearly absent at the toes, worse in the left hand and foot compared to the right.  There is a length dependent loss of temperature and pinprick.  Vibration sensation is absent to the knee on the left  and only 5 seconds at the ankle on the right, 8 seconds at the fingers bilaterally.  There is no sensory level on the back. Deep Tendon Reflexes: 2+ and symmetric in the biceps, 1+ at the brachioradialis, absent at the patellar's and Achilles with mute toes bilaterally Cerebellar: FNF and HKS are intact bilaterally within limits of weakness  I have reviewed labs in epic and the results pertinent to this consultation are: Hyponatremia to 129 (normal natremia get baseline), mild hypochloremia at 96, mildly elevated glucose at 138 CBC notable for mild polycythemia 15.5 (baseline 15-16 recently)  I have reviewed the images obtained: CT Angio Chest/Abd/Pel for Dissection W and/or Wo Contrast  1. No evidence of aortic dissection or other acute aortic syndrome. 2. Minimal atelectatic changes in the lungs. No other acute intrathoracic process. 3. Gallbladder distension with some faint pericholecystic stranding towards the fundus. Could reflect an early or developing acute cholecystitis. Correlate with serologies and further imaging with right upper quadrant ultrasound. 4. Large air-filled duodenal diverticulum without focal  inflammation to suggest an inflammatory change at this time. 5. Segmental thickening of the sigmoid in a region of numerous colonic diverticula without adjacent stranding or phlegmon. Can reflect sequela of prior inflammation or a more chronic smoldering inflammation such as segmental colitis associated with diverticulosis. Correlate with clinical symptoms and consider outpatient direct visualization if not recently performed 6. Biatrial enlargement and coronary artery atherosclerosis. 7. Aortic Atherosclerosis (ICD10-I70.0). Mild ostial plaque narrowing of the celiac axis, SMA and left renal artery origin  RUQ ultrasound:  Distended gallbladder without gallstones or pericholecystic fluid. No evidence of cholecystitis. No biliary dilatation.  MRI Cervical, thoracic and lumbar spine w/ and w/o contrast personally reviewed, agree with radiology:   1. Normal spinal cord. The conus medullaris and cauda equina nerve roots appear stable since March and within normal limits. No imaging evidence of Guillain-Barre or spinal cord demyelination. 2. Advanced lumbar spine degeneration in the setting of moderate scoliosis is stable since a March MRI, including patchy lumbar vertebral body marrow edema, moderate multifactorial spinal stenosis at L3-L4, intermittent lateral recess and occasionally moderate to severe lumbar neural foraminal stenosis (left L4 and L5 nerve levels). 3. Generally mild for age thoracic spine degeneration, largely facet arthropathy. No thoracic spinal stenosis. Up to moderate associated left T6 and left T10 neural foraminal stenosis. 4. 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 5. Distended urinary bladder suspicious for Urinary Retention. Extensive diverticulosis of large bowel in the pelvis but no active  inflammation.  Impression:  Given the patient's progressive sensory and motor impairment I am concerned for Guillain-Barr syndrome, particularly AMSAN. Myasthenia gravis less likely but on the differential. Given somewhat upper motor neuron pattern to patient's weakness, will additional obtain spinal cord imaging. No clear triggering factor that would activate her immune system such as infection or vaccination.  Examination is concerning for neck flexion weakness 3/5, single breath test of 25, mild diffuse upper extremity weakness 4/5 with finger extension weakness of 4 -/5 and profound lower extremity weakness with hip flexors bilaterally 2/5, and a sending areflexia, with absent lower extremity reflexes, 1+ brachioradialis and 2+ biceps bilaterally.  She also has profound sensory involvement with loss of proprioception, vibration worse in the lower extremities and upper extremities, length dependent temperature loss and pinprick loss.  No spinal cord level appreciated  on testing of her back, and no point tenderness to palpation.  Overall this examination is very concerning for peripheral nerve localization.  Unfortunately cannot perform a diagnostic lumbar puncture at this time due to last dose of Eliquis 8/11 AM.  However, I do not feel comfortable delaying IVIG for further diagnostic testing given the patient's rapid progression over a few days, inability to ambulate, age, and neck flexor weakness, as well as sensory in addition to motor involvement which put her at high risk of progression to respiratory failure.    EGRIS risk calculation: Days between onset of weakness and hospital admission? < 4 days Facial or bulbar weakness at hospital admission? Absent MRC score of muscle groups at hospital admission? Right Left Shoulder abduction 4 4 Elbow flexion  4 4 Wrist extension 3 3 Hip flexion  2 2 Knee extension 5 4 Ankle dorsiflexion 4 4 EGRIS (0-7): 3 Risk of developing respiratory failure  in first week of admission: 17% (95% CI: 10-27%) Reference EGRIS: Walgaard et al., Ann Neurol [phone]   Recommendations: -Admission to stepdown bed, if NIF/FVC are very concerning may need ICU monitoring -Cardiac monitoring -NIF/FVC every 4 hours  -Initial NIF at 4 AM -30  -IVIG 2 g/kg split over 5 days with pretreatment with 500 cc normal saline for renal protection -MRI C-spine, T-spine and L-spine with and without contrast -AChR and MUSK antibodies  -Consider utility of lumbar puncture pending MRI results, especially as it will be confounded by preceding IVIG treatment, noting that lumbar puncture can also be fairly normal within the first 1 to 2 weeks of symptom onset; hold apixaban for now -Hold blood pressure medications, consider continuing metoprolol at half home dose to avoid rebound tachycardia -Neurology will follow along  Angela Noe MD-PhD [facility] [phone] Available 7 PM to 7 AM, outside of these hours please call Neurologist on call as listed on Amion.

## 2021-02-06 NOTE — Telephone Encounter (Signed)
Patient's son want's to make Dr. Curt Bears aware that the patient has been admitted to the hospital. He requested a call back to discuss further.

## 2021-02-07 ENCOUNTER — Other Ambulatory Visit: Payer: Self-pay

## 2021-02-07 ENCOUNTER — Inpatient Hospital Stay (HOSPITAL_COMMUNITY): Payer: Medicare Other

## 2021-02-07 DIAGNOSIS — R531 Weakness: Secondary | ICD-10-CM

## 2021-02-07 DIAGNOSIS — I4891 Unspecified atrial fibrillation: Secondary | ICD-10-CM | POA: Diagnosis not present

## 2021-02-07 DIAGNOSIS — Z7901 Long term (current) use of anticoagulants: Secondary | ICD-10-CM | POA: Diagnosis not present

## 2021-02-07 DIAGNOSIS — J9601 Acute respiratory failure with hypoxia: Secondary | ICD-10-CM | POA: Diagnosis not present

## 2021-02-07 DIAGNOSIS — R1312 Dysphagia, oropharyngeal phase: Secondary | ICD-10-CM | POA: Diagnosis not present

## 2021-02-07 DIAGNOSIS — G610 Guillain-Barre syndrome: Secondary | ICD-10-CM | POA: Diagnosis not present

## 2021-02-07 LAB — BASIC METABOLIC PANEL
Anion gap: 11 (ref 5–15)
Anion gap: 9 (ref 5–15)
BUN: 7 mg/dL — ABNORMAL LOW (ref 8–23)
BUN: 9 mg/dL (ref 8–23)
CO2: 23 mmol/L (ref 22–32)
CO2: 22 mmol/L (ref 22–32)
Calcium: 8.9 mg/dL (ref 8.9–10.3)
Calcium: 8.4 mg/dL — ABNORMAL LOW (ref 8.9–10.3)
Chloride: 89 mmol/L — ABNORMAL LOW (ref 98–111)
Chloride: 90 mmol/L — ABNORMAL LOW (ref 98–111)
Creatinine, Ser: 0.37 mg/dL — ABNORMAL LOW (ref 0.44–1.00)
Creatinine, Ser: 0.47 mg/dL (ref 0.44–1.00)
GFR, Estimated: >60 mL/min (ref >60)
GFR, Estimated: >60 mL/min (ref >60)
Glucose, Bld: 119 mg/dL — ABNORMAL HIGH (ref 70–99)
Glucose, Bld: 122 mg/dL — ABNORMAL HIGH (ref 70–99)
Potassium: 3 mmol/L — ABNORMAL LOW (ref 3.5–5.1)
Potassium: 3.6 mmol/L (ref 3.5–5.1)
Sodium: 123 mmol/L — ABNORMAL LOW (ref 135–145)
Sodium: 121 mmol/L — ABNORMAL LOW (ref 135–145)

## 2021-02-07 LAB — CBC
HCT: 43.4 % (ref 36.0–46.0)
Hemoglobin: 15.4 g/dL — ABNORMAL HIGH (ref 12.0–15.0)
MCH: 33.6 pg (ref 26.0–34.0)
MCHC: 35.5 g/dL (ref 30.0–36.0)
MCV: 94.6 fL (ref 80.0–100.0)
Platelets: 258 10*3/uL (ref 150–400)
RBC: 4.59 MIL/uL (ref 3.87–5.11)
RDW: 12.1 % (ref 11.5–15.5)
WBC: 12.4 10*3/uL — ABNORMAL HIGH (ref 4.0–10.5)
nRBC: 0 % (ref 0.0–0.2)

## 2021-02-07 LAB — HEPATIC FUNCTION PANEL
ALT: 12 U/L (ref 0–44)
AST: 23 U/L (ref 15–41)
Albumin: 2.9 g/dL — ABNORMAL LOW (ref 3.5–5.0)
Alkaline Phosphatase: 49 U/L (ref 38–126)
Bilirubin, Direct: 0.2 mg/dL (ref 0.0–0.2)
Indirect Bilirubin: 0.8 mg/dL (ref 0.3–0.9)
Total Bilirubin: 1 mg/dL (ref 0.3–1.2)
Total Protein: 6.8 g/dL (ref 6.5–8.1)

## 2021-02-07 LAB — APTT: aPTT: 88 seconds — ABNORMAL HIGH (ref 24–36)

## 2021-02-07 LAB — TSH: TSH: 1.77 u[IU]/mL (ref 0.350–4.500)

## 2021-02-07 LAB — HEPARIN LEVEL (UNFRACTIONATED): Heparin Unfractionated: 1 IU/mL — ABNORMAL HIGH (ref 0.30–0.70)

## 2021-02-07 MED ORDER — PIPERACILLIN-TAZOBACTAM 3.375 G IVPB 30 MIN
3.3750 g | Freq: Once | INTRAVENOUS | Status: AC
  Administered 2021-02-07: 3.375 g via INTRAVENOUS
  Filled 2021-02-07 (×2): qty 50

## 2021-02-07 MED ORDER — SODIUM CHLORIDE 0.9% FLUSH: 10.0000 mL | INTRAVENOUS | Status: DC | PRN

## 2021-02-07 MED ORDER — POTASSIUM CHLORIDE 10 MEQ/100ML IV SOLN: 10.0000 meq | INTRAVENOUS | Status: DC

## 2021-02-07 MED ORDER — SODIUM CHLORIDE 0.9% FLUSH
10.0000 mL | Freq: Two times a day (BID) | INTRAVENOUS | Status: DC
  Administered 2021-02-07 – 2021-02-10 (×5): 10 mL
  Administered 2021-02-10 – 2021-02-11 (×2): 20 mL
  Administered 2021-02-11 – 2021-03-04 (×34): 10 mL

## 2021-02-07 MED ORDER — PIPERACILLIN-TAZOBACTAM 3.375 G IVPB
3.3750 g | Freq: Three times a day (TID) | INTRAVENOUS | Status: DC
  Administered 2021-02-07 – 2021-02-08 (×2): 3.375 g via INTRAVENOUS
  Filled 2021-02-07 (×3): qty 50

## 2021-02-07 MED ORDER — POTASSIUM CHLORIDE CRYS ER 20 MEQ PO TBCR: 40.0000 meq | EXTENDED_RELEASE_TABLET | Freq: Once | ORAL | Status: DC

## 2021-02-07 MED ORDER — ACETAMINOPHEN 160 MG/5ML PO SOLN
650.0000 mg | Freq: Four times a day (QID) | ORAL | Status: DC | PRN
  Administered 2021-02-07 – 2021-02-21 (×4): 650 mg via ORAL
  Filled 2021-02-07 (×4): qty 20.3

## 2021-02-07 MED ORDER — POTASSIUM CHLORIDE 10 MEQ/100ML IV SOLN
10.0000 meq | INTRAVENOUS | Status: AC
  Administered 2021-02-07 (×4): 10 meq via INTRAVENOUS
  Filled 2021-02-07 (×4): qty 100

## 2021-02-07 MED ORDER — ALBUTEROL SULFATE (2.5 MG/3ML) 0.083% IN NEBU: 2.5000 mg | INHALATION_SOLUTION | RESPIRATORY_TRACT | Status: DC | PRN

## 2021-02-07 NOTE — Progress Notes (Signed)
Pt achieved a NIF of -25 and VC of 1.24. RT will continue to monitor.

## 2021-02-07 NOTE — Progress Notes (Signed)
RT Note: NIF -20      VC 0.76  Pt weaned to 3L [place] Spo2 94-96% No increased WOB noted pt states she feels her breathing is a little better than earlier. RT will continue to monitor

## 2021-02-07 NOTE — Progress Notes (Signed)
Physical Therapy Treatment Patient Details Name: Angela Morales MRN: [id number] DOB: 04-28-1946 Today's Date: 02/07/2021    History of Present Illness Pt is a 75 y.o. 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 Pt also with afib with RVR. Eliquis on hold for possible need of LP; awaiting neurology consult. PMH includes afib (recent ablation 01/28/21), celiac disease, DDD, gluacoma, L TKA (2017).   PT Comments    Pt seen for additional session for transfer from recliner with assistance from RN. Pt requires totalA for scooting transfer and bed mobility; suspect decreased core control related to fatigue from sitting up in recliner. Pt positioned to encourage neutral alignment and pressure relief. Will continue to follow acutely to address established goals.    Follow Up Recommendations  CIR;Supervision for mobility/OOB     Equipment Recommendations   (TBD)    Recommendations for Other Services       Precautions / Restrictions Precautions Precautions: Fall;Other (comment) Precaution Comments: Incontinence Restrictions Weight Bearing Restrictions: No    Mobility  Bed Mobility Overal bed mobility: Needs Assistance Bed Mobility: Sit to Supine;Rolling Rolling: Max assist   Supine to sit: Max assist;HOB elevated;Total assist Sit to supine: Max assist;+2 for physical assistance   General bed mobility comments: MaxA+2 to totalA for BLE management and trunk control with return to supine; pt able to roll R/L with initiation of trunk rotation, ultimately requiring maxA to rotate hips/trunk and totalA for BLE management, pt unable to reach hand rails or maintain grip on them    Transfers Overall transfer level: Needs assistance Equipment used: None Transfers: Lateral/Scoot Transfers          Lateral/Scoot Transfers: Total assist;+2 physical assistance General transfer comment: TotalA+2 for lateral scoot from drop  arm recliner to bed; pt requiring max-totalA to maintain trunk flexion and totalA for BLE positioning; pt attempting to assist with BUEs, but ultimately able to do very little with BUE support  Ambulation/Gait             General Gait Details: unable this session   Stairs             Wheelchair Mobility    Modified Rankin (Stroke Patients Only)       Balance Overall balance assessment: Needs assistance   Sitting balance-Leahy Scale: Poor Sitting balance - Comments: Increased fatigue with return to bed, requiring mod-maxA to maintain static sitting and prevent pelvis sliding off EOB                                    Cognition Arousal/Alertness: Awake/alert Behavior During Therapy: WFL for tasks assessed/performed Overall Cognitive Status: Within Functional Limits for tasks assessed                                        Exercises Other Exercises Other Exercises: Encouraged cervical AROM and BUE/BLE AROM, even if more like an isometric contractions due to weakness    General Comments General comments (skin integrity, edema, etc.): VSS on RA. RN present to assist. Pt positioned to encourage neutral alignment, scap pressure relief; pt unable to tolerate heels floated on pillow due to calf/hamstring stretch, but hopefully prevalon boots delivered soon      Pertinent Vitals/Pain Pain Assessment: Faces Faces Pain Scale: Hurts little more Pain Location:  L hip, neck Pain Descriptors / Indicators: Discomfort;Tightness;Sore Pain Intervention(s): Limited activity within patient's tolerance;Monitored during session;Repositioned    Home Living Family/patient expects to be discharged to:: Private residence Living Arrangements: Spouse/significant other;Children Available Help at Discharge: Family;Available PRN/intermittently Type of Home: House Home Access: Stairs to enter Entrance Stairs-Rails: Left Home Layout: Multi-level;Able to live  on main level with bedroom/bathroom;Full bath on main level Home Equipment: None Additional Comments: Lives with husband and son; husband works    Prior Function Level of Independence: Independent      Comments: Independent without DME; retired Press [employer]; drives; enjoys babysitting grandkids (14 and 45 y.o.), enjoys time with cat and dog, watching tv   PT Goals (current goals can now be found in the care plan section) Acute Rehab PT Goals Patient Stated Goal: Return home, ("I know it's going to be a long process") PT Goal Formulation: With patient/family Time For Goal Achievement: 02/21/21 Potential to Achieve Goals: Good Progress towards PT goals: Not progressing toward goals - comment (increased fatigue)    Frequency    Min 4X/week      PT Plan Current plan remains appropriate    Co-evaluation              AM-PAC PT "6 Clicks" Mobility   Outcome Measure  Help needed turning from your back to your side while in a flat bed without using bedrails?: Total Help needed moving from lying on your back to sitting on the side of a flat bed without using bedrails?: Total Help needed moving to and from a bed to a chair (including a wheelchair)?: Total Help needed standing up from a chair using your arms (e.g., wheelchair or bedside chair)?: Total Help needed to walk in hospital room?: Total Help needed climbing 3-5 steps with a railing? : Total 6 Click Score: 6    End of Session Equipment Utilized During Treatment: Gait belt Activity Tolerance: Patient limited by fatigue Patient left: in bed;with call bell/phone within reach;with bed alarm set Nurse Communication: Mobility status;Need for lift equipment PT Visit Diagnosis: Other abnormalities of gait and mobility (R26.89);Muscle weakness (generalized) (M62.81);Other symptoms and signs involving the nervous system (R29.898);Hemiplegia and hemiparesis Hemiplegia - caused by: Unspecified     Time:  0223-3612 PT Time Calculation (min) (ACUTE ONLY): 20 min  Charges:  $Therapeutic Activity: 8-22 mins                     Mabeline Caras, PT, DPT Acute Rehabilitation Services  Pager [phone] Office [place] 02/07/2021, 1:01 PM

## 2021-02-07 NOTE — Consult Note (Signed)
NAME:  Angela Morales, MRN:  [id number], DOB:  [birth year], LOS: 1 ADMISSION DATE:  02/05/2021, CONSULTATION DATE:  02/07/21  REFERRING MD:  Dr Lupita Leash, CHIEF COMPLAINT:  tachypnea, hypoxemia   History of Present Illness:  75 year old woman with a history of atrial fibrillation (ablated 8/3), celiac disease, DDD who was admitted on 8/11 with bilateral upper and lower extremity weakness and paresthesias beginning about 8/8.  Developed some ascending weakness that resulted in inability to ambulate.  No other prodrome.  No clear precipitants although she was started on gabapentin about 1 month ago.  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  LP deferred initially as she was on Eliquis last dose 8/11.  IVIG started 8/12.  She has had progressive bilateral upper extremity weakness, some bulbar dysfunction difficulty swallowing, globus sensation with perception of swallowing and voice weakness.  FVC has been consistently greater than 1.0 L, NIF <-25.  She had an episode of desaturation, increased work of breathing and choking when eating Jell-O and pudding evening of 8/13.  Pertinent  Medical History   Past Medical History:  Diagnosis Date   Abnormal uterine bleeding    on HRT   Allergy    seasonal   Arthritis    Atrial fibrillation (HCC)    Celiac disease    DDD (degenerative disc disease)    Dysrhythmia    Glaucoma    Microscopic colitis    Osteoarthritis of both knees    PONV (postoperative nausea and vomiting)    SVT (supraventricular tachycardia) ([place])      Significant Hospital Events: Including procedures, antibiotic start and stop dates in addition to other pertinent events   IVIg started 8/12 CT CAP >> no dissection, bibasilar atelectasis, distended gallbladder with some pericholecystic stranding, segmental colitis question related to diverticular disease RUQ ultrasound 8/12 >> distended gallbladder without stones or pericholecystic fluid no evidence  cholecystitis MR cervical, thoracic, lumbar spine 8/12 > normal spinal cord no imaging evidence of demyelination or Guillain-Barr.  Advanced lumbar DJD, cervical DJD  Interim History / Subjective:  Stabilizing some following coughing episode and desaturation associated with some dysphagia and choking after eating soft food  Objective   Blood pressure (!) 170/92, pulse 72, temperature 97.7 F (36.5 C), temperature source Oral, resp. rate 19, weight 66.1 kg, last menstrual period 04/20/2005, SpO2 97 %.        Intake/Output Summary (Last 24 hours) at 02/07/2021 1749 Last data filed at 02/07/2021 1430 Gross per 24 hour  Intake 1065.11 ml  Output 1550 ml  Net -484.89 ml   Filed Weights   02/07/21 0530  Weight: 66.1 kg    Examination: General: Thin woman in no distress, somewhat tachypneic HENT: Oropharynx clear, no saliva or stridor.  Somewhat hoarse voice Lungs: Diminished bilateral breath sounds, some upper airway noise and secretions heard Cardiovascular: Regular, borderline tachycardic, no murmur Abdomen: Nondistended with positive bowel sounds Extremities: No significant edema Neuro: Weak to moderate cough strength but she is unable to clear secretions all the way into her mouth, no facial droop or ptosis, 3/5 strength bilateral upper extremities.  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  Her vital capacity is reassuring but NIF is borderline (  goal < -30).  Also concerning is her bulbar dysfunction and difficulty managing secretions. -Move to the ICU now for closer monitoring -Strict n.p.o. -Continue to follow NIF, vital capacity closely.  Every 2  hours -Follow ability to manage secretions closely.  Have explained to her that she needs to let us know if changes are occurring -Consider NTS to assist with secretion management if she can tolerate -Electively intubate if progressive bulbar symptoms or if she shows evidence for progressive weakness based on her NIF and vital capacity. -Note started on Zosyn 8/13 for possible aspiration pneumonia.  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 -Planning for 5 IVIG treatments -Acetylcholine receptor studies, MUSK antibodies are all pending -Antihypertensives are on hold, low-dose metoprolol continue to avoid rebound tachycardia -LP once she has been off Eliquis if we believe it still indicated  Best Practice (right click and "Reselect all SmartList Selections" daily)   Diet/type: NPO DVT prophylaxis: systemic heparin GI prophylaxis: N/A Lines: N/A Foley:  N/A Code Status:  full code Last date of multidisciplinary goals of care discussion [Goals discussed w patient 8/13, would want full scope of care]  Labs   CBC: Recent Labs  Lab 02/05/21 2251 02/06/21 0831 02/07/21 0407  WBC 10.3 11.8* 12.4*  NEUTROABS 8.3*  --   --   HGB 15.5* 14.5 15.4*  HCT 46.1* 42.8 43.4  MCV 98.7 98.4 94.6  PLT 282 263 270    Basic Metabolic Panel: Recent Labs  Lab 02/05/21 2251 02/06/21 0831 02/07/21 0407 02/07/21 1502  NA 129* 129* 123* 121*  K 3.5 3.7 3.0* 3.6  CL 96* 96* 89* 90*  CO2 24 23 23 22   GLUCOSE 138* 120* 119* 122*  BUN 5* 6* 7* 9  CREATININE 0.46 0.41* 0.37* 0.47  CALCIUM 9.4 8.8* 8.9 8.4*   GFR: Estimated Creatinine Clearance: 56.9 mL/min (by C-G formula based on SCr of 0.47 mg/dL). Recent Labs  Lab 02/05/21 2251 02/06/21 0831 02/07/21 0407  WBC 10.3 11.8* 12.4*    Liver Function Tests: Recent Labs  Lab  02/07/21 0508  AST 23  ALT 12  ALKPHOS 49  BILITOT 1.0  PROT 6.8  ALBUMIN 2.9*   No results for input(s): LIPASE, AMYLASE in the last 168 hours. No results for input(s): AMMONIA in the last 168 hours.  ABG No results found for: PHART, PCO2ART, PO2ART, HCO3, TCO2, ACIDBASEDEF, O2SAT   Coagulation Profile: No results for input(s): INR, PROTIME in the last 168 hours.  Cardiac Enzymes: No results for input(s): CKTOTAL, CKMB, CKMBINDEX, TROPONINI in the last 168 hours.  HbA1C: Hgb A1c MFr Bld  Date/Time Value Ref Range Status  12/01/2013 02:00 AM 5.4 <5.7 % Final    Comment:    (NOTE)                                                                       According to the ADA Clinical Practice Recommendations for 2011, when HbA1c is used as a screening test:  >=6.5%   Diagnostic of Diabetes Mellitus           (if abnormal result is confirmed) 5.7-6.4%   Increased risk  of developing Diabetes Mellitus References:Diagnosis and Classification of Diabetes Mellitus,Diabetes OVFI,4332,95(JOACZ 1):S62-S69 and Standards of Medical Care in         Diabetes - 2011,Diabetes YSAY,3016,01 (Suppl 1):S11-S61.    CBG: Recent Labs  Lab 02/06/21 0002  GLUCAP 129*    Review of Systems:   Increased swallowing difficulty Globus sensation Stable UE weakness from this am  Past Medical History:  She,  has a past medical history of Abnormal uterine bleeding, Allergy, Arthritis, Atrial fibrillation ([place]), Celiac disease, DDD (degenerative disc disease), Dysrhythmia, Glaucoma, Microscopic colitis, Osteoarthritis of both knees, PONV (postoperative nausea and vomiting), and SVT (supraventricular tachycardia) ([place]).   Surgical History:   Past Surgical History:  Procedure Laterality Date   APPENDECTOMY     ATRIAL FIBRILLATION ABLATION N/A 01/28/2021   Procedure: ATRIAL FIBRILLATION ABLATION;  Surgeon: Constance Haw, MD;  Location: [place] [facility];  Service: Cardiovascular;  Laterality:  N/A;   CARDIOVERSION N/A 10/30/2020   Procedure: CARDIOVERSION;  Surgeon: Jerline Pain, MD;  Location: [facility] ENDOSCOPY;  Service: Cardiovascular;  Laterality: N/A;   COLONOSCOPY     FOOT FRACTURE SURGERY  10/2013   TONSILLECTOMY AND ADENOIDECTOMY     TOTAL KNEE ARTHROPLASTY Left 03/22/2016   Procedure: TOTAL KNEE ARTHROPLASTY;  Surgeon: Dorna Leitz, MD;  Location: [facility];  Service: Orthopedics;  Laterality: Left;   TUBAL LIGATION  1981   VAGINAL HYSTERECTOMY  04/20/05   prolapse, adenomyosis     Social History:   reports that she has never smoked. She has never used smokeless tobacco. She reports current alcohol use of about 4.0 - 6.0 standard drinks per week. She reports that she does not use drugs.   Family History:  Her family history includes Bipolar disorder in her son; Breast cancer in her cousin; Heart disease (age of onset: 14) in her brother; Liver disease in her son; Osteoarthritis in her maternal grandmother; Osteoporosis in her mother; Prostate cancer in her father; Stroke in her mother. There is no history of Colon cancer.   Allergies Allergies  Allergen Reactions   Gluten Meal Other (See Comments)    celiac disease   Cefdinir Rash     Home Medications  Prior to Admission medications   Medication Sig Start Date End Date Taking? Authorizing Provider  apixaban (ELIQUIS) 5 MG TABS tablet TAKE 1 TABLET BY MOUTH TWICE A DAY 12/22/20  Yes Camnitz, Will Hassell Done, MD  gabapentin (NEURONTIN) 300 MG capsule Take 300 mg by mouth at bedtime. 01/15/21  Yes [provider]  ibuprofen (ADVIL,MOTRIN) 200 MG tablet Take 400 mg by mouth every 6 (six) hours as needed for moderate pain.   Yes [provider]  loratadine (CLARITIN) 10 MG tablet Take 10 mg by mouth daily as needed for allergies.   Yes [provider]  metoprolol succinate (TOPROL-XL) 25 MG 24 hr tablet TAKE 1 TABLET BY MOUTH EVERY DAY 08/25/20  Yes Croitoru, Mihai, MD  Polyethyl Glycol-Propyl Glycol  (SYSTANE) 0.4-0.3 % SOLN Place 1-2 drops into both eyes 3 (three) times daily as needed (dry eyes).   Yes [provider]  psyllium (METAMUCIL) 58.6 % packet Take 1 packet by mouth in the morning.   Yes [provider]  timolol (TIMOPTIC) 0.5 % ophthalmic solution Place 1 drop into the left eye 2 (two) times daily. 08/17/16  Yes [provider]  ZIOPTAN 0.0015 % SOLN Place 1 drop into the left eye at bedtime. 01/13/21  Yes [provider]  Critical care time: 15 min     Baltazar Apo, MD, PhD 02/07/2021, 6:16 PM [facility] [phone] or if no answer before 7:00PM call [phone] For any issues after 7:00PM please call eLink [phone]

## 2021-02-07 NOTE — Consult Note (Addendum)
Attending physician's note   I have taken an interval history, reviewed the chart and examined the patient. I agree with the Advanced Practitioner's note, impression, and recommendations as outlined.   75 year old female with medical history as outlined below, admitted with progressive neurologic symptoms, suspicious for Guillain-Barr Syndrome.  GI service was consulted for evaluation of dysphagia.  Prior to hospital admission, no history of dysphagia.  EGD in 2018 with normal-appearing esophagus (diagnosed with Celiac Disease at that time).  Her neurologic symptoms have continued to progress, and planning transport to MICU tonight.  Based on clinical history, her dysphagia seems undoubtedly neurologic in nature.  There are instances of dysphagia 2/2 Guillain-Barr syndrome, which can sometimes be accompanied by speech/voice disruption.  - No role for endoscopic evaluation in this setting - Similarly, no plan for inpatient Esophageal Manometry as this is all related to the underlying progressive neurologic condition - IVIG and continue care per Neurology and Critical Care services - Pending clinical course, will likely need to consider enteral feeding, core track, etc. - GI service will sign off at this time.  Please do not hesitate to contact us with additional questions or concerns  Gerrit Heck, DO, FACG [phone] office        Consultation  Referring Provider: [facility] Primary Care Physician:  Deland Pretty, MD Primary Gastroenterologist:  Dr. Loletha Carrow  Reason for Consultation: Dysphagia acute  HPI: Angela Morales is a 75 y.o. female, who was admitted yesterday after acute onset of weakness for 3 days then development of numbness in her arms and legs. 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 Today patient was expressing difficulty  swallowing and sense of increased weakness. She was evaluated by speech pathology and was able to handle liquids but required multiple swallows with pured and solid food and a sensation that the food may be wanting to come back up.  She was recommended to have full liquid diet. Had also been complaining of a globus type sensation and a sense of hoarseness.  She has not had any prior dysphagia issues.  She did have EGD in 2018 per Dr. Loletha Carrow with a normal-appearing esophagus, she was noted to have duodenitis and biopsies showed villous blunting with increased intraepithelial lymphocytes.  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 She also carries diagnosis of celiac disease, degenerative disc disease, history of SVT and A. fib as above for which she underwent ablation in May 2022.  Positive history of QT prolongation.  Patient also noted to have hyponatremia today with drop in sodium to 121 Further work-up underway.  Patient says that she was swallowing fine prior to onset of her acute illness and has never had any dysphagia issues.  She has had progressive weakness even over the past 24 hours and progressive difficulty swallowing since yesterday. While on was in the room she was trying to take some clear liquids which she says are not really going down well at this point either and feel as if they are sitting in the back of her throat.  After trying to clear liquids O2 sats have dropped to 83 Patient too weak to hold suction on her own.  I discussed with patient that she may require enteral feedings.  She does not think that  she can handle an enteral feeding tube and wants to hold off on that. Husband says she really has not eaten anything at all for 3 days.   Past Medical History:  Diagnosis Date   Abnormal uterine bleeding    on HRT   Allergy    seasonal   Arthritis    Atrial fibrillation  (HCC)    Celiac disease    DDD (degenerative disc disease)    Dysrhythmia    Glaucoma    Microscopic colitis    Osteoarthritis of both knees    PONV (postoperative nausea and vomiting)    SVT (supraventricular tachycardia) ([place])     Past Surgical History:  Procedure Laterality Date   APPENDECTOMY     ATRIAL FIBRILLATION ABLATION N/A 01/28/2021   Procedure: ATRIAL FIBRILLATION ABLATION;  Surgeon: Constance Haw, MD;  Location: [place] [facility];  Service: Cardiovascular;  Laterality: N/A;   CARDIOVERSION N/A 10/30/2020   Procedure: CARDIOVERSION;  Surgeon: Jerline Pain, MD;  Location: [facility] ENDOSCOPY;  Service: Cardiovascular;  Laterality: N/A;   COLONOSCOPY     FOOT FRACTURE SURGERY  10/2013   TONSILLECTOMY AND ADENOIDECTOMY     TOTAL KNEE ARTHROPLASTY Left 03/22/2016   Procedure: TOTAL KNEE ARTHROPLASTY;  Surgeon: Dorna Leitz, MD;  Location: [facility];  Service: Orthopedics;  Laterality: Left;   TUBAL LIGATION  1981   VAGINAL HYSTERECTOMY  04/20/05   prolapse, adenomyosis    Prior to Admission medications   Medication Sig Start Date End Date Taking? Authorizing Provider  apixaban (ELIQUIS) 5 MG TABS tablet TAKE 1 TABLET BY MOUTH TWICE A DAY 12/22/20  Yes Camnitz, Will Hassell Done, MD  gabapentin (NEURONTIN) 300 MG capsule Take 300 mg by mouth at bedtime. 01/15/21  Yes [provider]  ibuprofen (ADVIL,MOTRIN) 200 MG tablet Take 400 mg by mouth every 6 (six) hours as needed for moderate pain.   Yes [provider]  loratadine (CLARITIN) 10 MG tablet Take 10 mg by mouth daily as needed for allergies.   Yes [provider]  metoprolol succinate (TOPROL-XL) 25 MG 24 hr tablet TAKE 1 TABLET BY MOUTH EVERY DAY 08/25/20  Yes Croitoru, Mihai, MD  Polyethyl Glycol-Propyl Glycol (SYSTANE) 0.4-0.3 % SOLN Place 1-2 drops into both eyes 3 (three) times daily as needed (dry eyes).   Yes [provider]  psyllium (METAMUCIL) 58.6 % packet Take 1 packet by mouth in the  morning.   Yes [provider]  timolol (TIMOPTIC) 0.5 % ophthalmic solution Place 1 drop into the left eye 2 (two) times daily. 08/17/16  Yes [provider]  ZIOPTAN 0.0015 % SOLN Place 1 drop into the left eye at bedtime. 01/13/21  Yes [provider]    Current Facility-Administered Medications  Medication Dose Route Frequency Provider Last Rate Last Admin   acetaminophen (TYLENOL) 160 MG/5ML solution 650 mg  650 mg Oral Q6H PRN Kc, Ramesh, MD   650 mg at 02/07/21 1452   acetaminophen (TYLENOL) tablet 650 mg  650 mg Oral Q6H PRN Chotiner, Yevonne Aline, MD   650 mg at 02/06/21 1157   Or   acetaminophen (TYLENOL) suppository 650 mg  650 mg Rectal Q6H PRN Chotiner, Yevonne Aline, MD       Chlorhexidine Gluconate Cloth 2 % PADS 6 each  6 each Topical Daily Elgergawy, Silver Huguenin, MD   6 each at 02/06/21 1949   diltiazem (CARDIZEM) 125 mg in dextrose 5% 125 mL (1 mg/mL) infusion  5-15 mg/hr Intravenous Continuous  Elgergawy, Silver Huguenin, MD 10 mL/hr at 02/07/21 0500 10 mg/hr at 02/07/21 0500   diphenhydrAMINE (BENADRYL) injection 25 mg  25 mg Intravenous Q6H PRN Bhagat, Srishti L, MD   25 mg at 02/06/21 2319   heparin ADULT infusion 100 units/mL (25000 units/268m)  900 Units/hr Intravenous Continuous WLyndee Leo RPH 9 mL/hr at 02/07/21 0500 900 Units/hr at 02/07/21 0500   Immune Globulin 10% (PRIVIGEN) IV infusion 25 g  400 mg/kg Intravenous Daily Bhagat, Srishti L, MD 152 mL/hr at 02/06/21 2156 Rate Change at 02/06/21 2156   latanoprost (XALATAN) 0.005 % ophthalmic solution 1 drop  1 drop Left Eye QHS Chotiner, BYevonne Aline MD   1 drop at 02/06/21 2146   senna-docusate (Senokot-S) tablet 1 tablet  1 tablet Oral QHS PRN Chotiner, BYevonne Aline MD       sodium chloride 0.9 % bolus 500 mL  500 mL Intravenous Daily Bhagat, Srishti L, MD   Stopped at 02/06/21 04196  sodium chloride 0.9 % bolus 500 mL  500 mL Intravenous Once Elgergawy, DSilver Huguenin MD       sodium chloride flush (NS) 0.9 %  injection 10-40 mL  10-40 mL Intracatheter Q12H Kc, Ramesh, MD       sodium chloride flush (NS) 0.9 % injection 10-40 mL  10-40 mL Intracatheter PRN Kc, Ramesh, MD       timolol (TIMOPTIC) 0.5 % ophthalmic solution 1 drop  1 drop Left Eye BID Chotiner, BYevonne Aline MD   1 drop at 02/06/21 2140    Allergies as of 02/05/2021 - Review Complete 02/05/2021  Allergen Reaction Noted   Gluten meal Other (See Comments) 10/22/2020   Cefdinir Rash 08/02/2013    Family History  Problem Relation Age of Onset   Osteoporosis Mother    Stroke Mother        mini   Prostate cancer Father    Heart disease Brother 676      shunt put in heart   Osteoarthritis Maternal Grandmother    Bipolar disorder Son    Liver disease Son    Breast cancer Cousin        1st cousin   Colon cancer Neg Hx     Social History   Socioeconomic History   Marital status: Married    Spouse name: Not on file   Number of children: 3   Years of education: Not on file   Highest education level: Not on file  Occupational History   Occupation: retired  Tobacco Use   Smoking status: Never   Smokeless tobacco: Never  Substance and Sexual Activity   Alcohol use: Yes    Alcohol/week: 4.0 - 6.0 standard drinks    Types: 4 - 6 Glasses of wine per week    Comment: occasional   Drug use: No   Sexual activity: Yes    Partners: Male    Birth control/protection: Surgical    Comment: TVH  Other Topics Concern   Not on file  Social History Narrative   Not on file   Social Determinants of Health   Financial Resource Strain: Not on file  Food Insecurity: Not on file  Transportation Needs: Not on file  Physical Activity: Not on file  Stress: Not on file  Social Connections: Not on file  Intimate Partner Violence: Not on file    Review of Systems: Pertinent positive and negative review of systems were noted in the above HPI section.  All other review of systems  was otherwise negative.   Physical Exam: Vital signs in  last 24 hours: Temp:  [97.6 F (36.4 C)-98.7 F (37.1 C)] 97.7 F (36.5 C) (08/13 0300) Pulse Rate:  [69-120] 72 (08/13 1437) Resp:  [14-22] 19 (08/13 1437) BP: (109-170)/(66-110) 170/92 (08/13 1114) SpO2:  [93 %-98 %] 97 % (08/13 1437) Weight:  [66.1 kg] 66.1 kg (08/13 0530)   General:   Alert,  Well-developed, thin older white female pleasant and cooperative in NAD Head:  Normocephalic and atraumatic. Eyes:  Sclera clear, no icterus.   Conjunctiva pink. Ears:  Normal auditory acuity. Nose:  No deformity, discharge,  or lesions. Mouth:  No deformity or lesions.   Neck:  Supple; no masses or thyromegaly. Lungs: Few scattered rhonchi  heart:  Regular rate and rhythm; no murmurs, clicks, rubs,  or gallops. Abdomen:  Soft,nontender, BS active,nonpalp mass or hsm.   Rectal:  Deferred  Msk:  Symmetrical without gross deformities. . Pulses:  Normal pulses noted. Extremities:  Without clubbing or edema. Neurologic:  Alert and  oriented x4; significant weakness bilateral upper extremities and lower extremities Skin:  Intact without significant lesions or rashes.. Psych:  Alert and cooperative. Normal mood and affect.  Intake/Output from previous day: 08/12 0701 - 08/13 0700 In: 2065.1 [P.O.:120; I.V.:875.1; IV Piggyback:1000] Out: 1611 [Urine:1611] Intake/Output this shift: Total I/O In: -  Out: 650 [Urine:650]  Lab Results: Recent Labs    02/05/21 2251 02/06/21 0831 02/07/21 0407  WBC 10.3 11.8* 12.4*  HGB 15.5* 14.5 15.4*  HCT 46.1* 42.8 43.4  PLT 282 263 258   BMET Recent Labs    02/06/21 0831 02/07/21 0407 02/07/21 1502  NA 129* 123* 121*  K 3.7 3.0* 3.6  CL 96* 89* 90*  CO2 23 23 22   GLUCOSE 120* 119* 122*  BUN 6* 7* 9  CREATININE 0.41* 0.37* 0.47  CALCIUM 8.8* 8.9 8.4*   LFT Recent Labs    02/07/21 0508  PROT 6.8  ALBUMIN 2.9*  AST 23  ALT 12  ALKPHOS 49  BILITOT 1.0  BILIDIR 0.2  IBILI 0.8   PT/INR No results for input(s): LABPROT, INR in  the last 72 hours. Hepatitis Panel No results for input(s): HEPBSAG, HCVAB, HEPAIGM, HEPBIGM in the last 72 hours.    IMPRESSION:  #30 75 year old white female with acute onset of progressive dysphagia over the past 24 hours now having difficulty with clear liquids and has had episode of desaturation after trying clear liquids.  Acute onset of illness within the past 3 to 4 days with progressive upper and lower extremity weakness, generalized weakness and a sensation of numbness in the upper and lower extremities. 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 Speech path earlier in the day after evaluation had suggested possible GI consultation for dysmotility  Patient has an acute neurogenic dysphagia, no role for any endoscopic evaluation or GI intervention  #2 nutrition-patient has not had any p.o. intake in the past 3 days.  She prefers not to have an enteral feeding tube.  This needs to be considered, she declines then will need TPN She has not been on any maintenance fluids #3 hyponatremia-acute-work-up in progress    Plan; n.p.o. Consider placement of enteral feeding.  Which patient is currently against, versus TPN.-We will defer to medicine service  GI service will sign off, available  if needed.   Amy Esterwood PA-C 02/07/2021, 4:39 PM

## 2021-02-07 NOTE — Evaluation (Signed)
Clinical/Bedside Swallow Evaluation Patient Details  Name: Angela Morales MRN: [id number] Date of Birth: 1945-08-11  Today's Date: 02/07/2021 Time: SLP Start Time (ACUTE ONLY): 1223 SLP Stop Time (ACUTE ONLY): 1240 SLP Time Calculation (min) (ACUTE ONLY): 17 min  Past Medical History:  Past Medical History:  Diagnosis Date   Abnormal uterine bleeding    on HRT   Allergy    seasonal   Arthritis    Atrial fibrillation (HCC)    Celiac disease    DDD (degenerative disc disease)    Dysrhythmia    Glaucoma    Microscopic colitis    Osteoarthritis of both knees    PONV (postoperative nausea and vomiting)    SVT (supraventricular tachycardia) ([place])    Past Surgical History:  Past Surgical History:  Procedure Laterality Date   APPENDECTOMY     ATRIAL FIBRILLATION ABLATION N/A 01/28/2021   Procedure: ATRIAL FIBRILLATION ABLATION;  Surgeon: Constance Haw, MD;  Location: [facility];  Service: Cardiovascular;  Laterality: N/A;   CARDIOVERSION N/A 10/30/2020   Procedure: CARDIOVERSION;  Surgeon: Jerline Pain, MD;  Location: [facility];  Service: Cardiovascular;  Laterality: N/A;   COLONOSCOPY     FOOT FRACTURE SURGERY  10/2013   TONSILLECTOMY AND ADENOIDECTOMY     TOTAL KNEE ARTHROPLASTY Left 03/22/2016   Procedure: TOTAL KNEE ARTHROPLASTY;  Surgeon: Dorna Leitz, MD;  Location: [place];  Service: Orthopedics;  Laterality: Left;   TUBAL LIGATION  1981   VAGINAL HYSTERECTOMY  04/20/05   prolapse, adenomyosis   HPI:  Angela Morales is a 75 y.o. female who presented with complaint of weakness and numbness in arms and legs. The symptoms began 3 days ago and have progressively worsened. Concern for GBS, pt receiving IVIG.  Pt has medical history significant for atrial fibrillation (on anticoagulation, recently s/p ablation on 8/3 after failed cardioversion on 10/30/2020), celiac disease, degenerative disc disease. CXR 8/11 without active disease.   Assessment / Plan /  Recommendation Clinical Impression  Pt presents with concern for pharyngeal v esophageal dysphagia.  Pt c/o globus sensation with solids > liquids.  Today, pt consumed thin liquid, puree, and regular texture solid.  With thin liquid, pt did not exhibit any s/s of dyspahgia.  There were multiple swallow required with puree and solids.  Pt endorsed having difficulty getting food down and feeling like it was coming back up.  Following liquid wash to help clear solid bolus there was repeated belching noted.  There was delayed throat clearing and hoarse vocal quality as well.  Pt reports no history of GERD or other esophageal issues.  During EGD for celiac, pt reports she was told that her esophagus appeared normal, and there were no comments to the contrary in GI notes from 2018.  Discussed diet preference with pt.  She would like a full liquid diet for comfort and ease of intake.  Pt also reports new sensory changes.  She says that water tastes sour; in fact, that most things taste sour or bitter.  She says that she feels like she has recently received novocaine (i.e. for dental procedure) and things that are cool feel hot in her mouth.     Recommend full liquid diet at present for comfort and ease with PO intake.  SLP to follow for diet tolerance with possible instrumental swallowing evaluation. Consider GI referral to assess for esophageal dysmotility/dysphagia.   SLP Visit Diagnosis: Dysphagia, pharyngoesophageal phase (R13.14)    Aspiration Risk  Mild aspiration  risk    Diet Recommendation Thin liquid   Liquid Administration via: Cup Supervision: Staff to assist with self feeding Compensations: Slow rate;Small sips/bites Postural Changes: Seated upright at 90 degrees;Remain upright for at least 30 minutes after po intake    Other  Recommendations Recommended Consults: Consider GI evaluation Oral Care Recommendations: Oral care BID   Follow up Recommendations    TBD    Frequency and Duration  min 2x/week  2 weeks       Prognosis Prognosis for Safe Diet Advancement: Good      Swallow Study   General HPI: Angela Morales is a 75 y.o. female who presented with complaint of weakness and numbness in arms and legs. The symptoms began 3 days ago and have progressively worsened. Concern for GBS, pt receiving IVIG.  Pt has medical history significant for atrial fibrillation (on anticoagulation, recently s/p ablation on 8/3 after failed cardioversion on 10/30/2020), celiac disease, degenerative disc disease. CXR 8/11 without active disease. Type of Study: Bedside Swallow Evaluation Diet Prior to this Study: Regular;Thin liquids Temperature Spikes Noted: No History of Recent Intubation: No Behavior/Cognition: Alert;Cooperative;Pleasant mood Oral Cavity Assessment: Within Functional Limits Oral Care Completed by SLP: No Oral Cavity - Dentition: Adequate natural dentition Vision: Functional for self-feeding Self-Feeding Abilities: Able to feed self;Needs assist Patient Positioning: Upright in bed Baseline Vocal Quality: Hoarse Volitional Cough: Strong Volitional Swallow: Able to elicit    Oral/Motor/Sensory Function Overall Oral Motor/Sensory Function: Mild impairment Facial ROM: Within Functional Limits Facial Symmetry: Within Functional Limits Lingual ROM: Within Functional Limits Lingual Symmetry: Within Functional Limits Lingual Strength: Reduced Velum: Within Functional Limits Mandible: Within Functional Limits   Ice Chips Ice chips: Not tested   Thin Liquid Thin Liquid: Within functional limits Presentation: Cup    Nectar Thick Nectar Thick Liquid: Not tested   Honey Thick Honey Thick Liquid: Not tested   Puree Puree: Impaired Pharyngeal Phase Impairments: Multiple swallows   Solid     Solid: Impaired Pharyngeal Phase Impairments: Multiple swallows      Celedonio Savage , [place], [place] Office: [phone]; Pager (8/13):  [phone] 02/07/2021,1:13 PM

## 2021-02-07 NOTE — Consult Note (Signed)
Electrophysiology Consultation:   Patient ID: [name] MRN: [id number]; DOB: 1946/04/27  Admit date: 02/05/2021 Date of Consult: 02/07/2021  PCP:  Deland Pretty, MD   [facility] Providers Cardiologist:  Sanda Klein, MD    Patient Profile:   Angela Morales is a 75 y.o. female with a hx of AF, celiac disease and degenerative disk disease who is being seen 02/07/2021 for the evaluation of AF at the request of Dr Lupita Leash.  History of Present Illness:   [name] was admitted 02/06/2021 after a recent AF ablation on 01/28/2021. Her admission complaint was rapidly progressing weakness. Her husband is at the bedside this AM.   She tells me that after the ablation she had slight chest discomfort that improved. She felt back to her normal self for several days and then developed 4 days of rapidly progressing weakness that started in with fingertips and legs and moved up to her trunk, neck, etc. 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 Her apixaban has been held and she is now on heparin gtt. She has experienced paroxysms of atrial fib w/ RVR while inpatient and is now on a dilt gtt maintaining sinus rhythm in the 70s.   Past Medical History:  Diagnosis Date   Abnormal uterine bleeding    on HRT   Allergy    seasonal   Arthritis    Atrial fibrillation (HCC)    Celiac disease    DDD (degenerative disc disease)    Dysrhythmia    Glaucoma    Microscopic colitis    Osteoarthritis of both knees    PONV (postoperative nausea and vomiting)    SVT (supraventricular tachycardia) ([place])     Past Surgical History:  Procedure Laterality Date   APPENDECTOMY     ATRIAL FIBRILLATION ABLATION N/A 01/28/2021   Procedure: ATRIAL FIBRILLATION ABLATION;  Surgeon: Constance Haw, MD;  Location: [place] [facility];  Service: Cardiovascular;  Laterality: N/A;   CARDIOVERSION N/A 10/30/2020   Procedure: CARDIOVERSION;  Surgeon:  Jerline Pain, MD;  Location: [facility] ENDOSCOPY;  Service: Cardiovascular;  Laterality: N/A;   COLONOSCOPY     FOOT FRACTURE SURGERY  10/2013   TONSILLECTOMY AND ADENOIDECTOMY     TOTAL KNEE ARTHROPLASTY Left 03/22/2016   Procedure: TOTAL KNEE ARTHROPLASTY;  Surgeon: Dorna Leitz, MD;  Location: [place];  Service: Orthopedics;  Laterality: Left;   TUBAL LIGATION  1981   VAGINAL HYSTERECTOMY  04/20/05   prolapse, adenomyosis     Home Medications:  Prior to Admission medications   Medication Sig Start Date End Date Taking? Authorizing Provider  apixaban (ELIQUIS) 5 MG TABS tablet TAKE 1 TABLET BY MOUTH TWICE A DAY 12/22/20  Yes Camnitz, Will Hassell Done, MD  gabapentin (NEURONTIN) 300 MG capsule Take 300 mg by mouth at bedtime. 01/15/21  Yes [provider]  ibuprofen (ADVIL,MOTRIN) 200 MG tablet Take 400 mg by mouth every 6 (six) hours as needed for moderate pain.   Yes [provider]  loratadine (CLARITIN) 10 MG tablet Take 10 mg by mouth daily as needed for allergies.   Yes [provider]  metoprolol succinate (TOPROL-XL) 25 MG 24 hr tablet TAKE 1 TABLET BY MOUTH EVERY DAY 08/25/20  Yes Croitoru, Mihai, MD  Polyethyl Glycol-Propyl Glycol (SYSTANE) 0.4-0.3 % SOLN Place 1-2 drops into both eyes 3 (three) times daily as needed (dry eyes).   Yes [provider]  psyllium (METAMUCIL) 58.6 % packet Take  1 packet by mouth in the morning.   Yes [provider]  timolol (TIMOPTIC) 0.5 % ophthalmic solution Place 1 drop into the left eye 2 (two) times daily. 08/17/16  Yes [provider]  ZIOPTAN 0.0015 % SOLN Place 1 drop into the left eye at bedtime. 01/13/21  Yes [provider]    Inpatient Medications: Scheduled Meds:  Chlorhexidine Gluconate Cloth  6 each Topical Daily   latanoprost  1 drop Left Eye QHS   metoprolol succinate  12.5 mg Oral Daily   potassium chloride  40 mEq Oral Once   sodium chloride flush  10-40 mL Intracatheter Q12H    timolol  1 drop Left Eye BID   Continuous Infusions:  diltiazem (CARDIZEM) infusion 10 mg/hr (02/07/21 0500)   heparin 900 Units/hr (02/07/21 0500)   Immune Globulin 10% 152 mL/hr at 02/06/21 2156   lactated ringers Stopped (02/06/21 1507)   sodium chloride Stopped (02/06/21 0833)   sodium chloride     PRN Meds: acetaminophen **OR** acetaminophen, diphenhydrAMINE, senna-docusate, sodium chloride flush  Allergies:    Allergies  Allergen Reactions   Gluten Meal Other (See Comments)    celiac disease   Cefdinir Rash    Social History:   Social History   Socioeconomic History   Marital status: Married    Spouse name: Not on file   Number of children: 3   Years of education: Not on file   Highest education level: Not on file  Occupational History   Occupation: retired  Tobacco Use   Smoking status: Never   Smokeless tobacco: Never  Substance and Sexual Activity   Alcohol use: Yes    Alcohol/week: 4.0 - 6.0 standard drinks    Types: 4 - 6 Glasses of wine per week    Comment: occasional   Drug use: No   Sexual activity: Yes    Partners: Male    Birth control/protection: Surgical    Comment: TVH  Other Topics Concern   Not on file  Social History Narrative   Not on file   Social Determinants of Health   Financial Resource Strain: Not on file  Food Insecurity: Not on file  Transportation Needs: Not on file  Physical Activity: Not on file  Stress: Not on file  Social Connections: Not on file  Intimate Partner Violence: Not on file    Family History:    Family History  Problem Relation Age of Onset   Osteoporosis Mother    Stroke Mother        mini   Prostate cancer Father    Heart disease Brother 31       shunt put in heart   Osteoarthritis Maternal Grandmother    Bipolar disorder Son    Liver disease Son    Breast cancer Cousin        1st cousin   Colon cancer Neg Hx      ROS:  Please see the history of present illness.   All other ROS reviewed  and negative.     Physical Exam/Data:   Vitals:   02/07/21 0300 02/07/21 0400 02/07/21 0530 02/07/21 0732  BP: 109/66 132/79 136/68 (!) 143/74  Pulse: 75 73 69 77  Resp: 20 18 20    Temp: 97.7 F (36.5 C)     TempSrc: Oral     SpO2: 93% 94% 96% 96%  Weight:   66.1 kg     Intake/Output Summary (Last 24 hours) at 02/07/2021 5188 Last data filed  at 02/07/2021 0500 Gross per 24 hour  Intake 1065.11 ml  Output 1611 ml  Net -545.89 ml   Last 3 Weights 02/07/2021 01/28/2021 01/16/2021  Weight (lbs) 145 lb 11.6 oz 140 lb 140 lb 6.4 oz  Weight (kg) 66.1 kg 63.504 kg 63.685 kg     Body mass index is 25.01 kg/m.  General:  Well nourished, well developed, in bed at thirty degrees with weak voice HEENT: normal Lymph: no adenopathy Neck: no JVD Endocrine:  No thryomegaly Vascular: No carotid bruits; FA pulses 2+ bilaterally without bruits  Cardiac:  normal S1, S2; RRR. Warm extremities Lungs:  clear to auscultation bilaterally, no accessory muscle use Abd: soft, nontender, no hepatomegaly  Ext: no edema Musculoskeletal:  No deformities, BUE and BLE strength normal and equal Skin: warm and dry  Neuro:  CNs 2-12 intact, no focal abnormalities noted Psych:  Normal affect   EKG:  The EKG was personally reviewed and demonstrates:  AFL w/ variable AV conduction and v rates  in the 120s. Appears atypical. Telemetry:  Telemetry was personally reviewed and demonstrates:  sinus rhythm in the 70s  Relevant CV Studies:  12/04/2020 echo EF 55% RV normal Mod dilated LA and RA Mild MR Moderate TR       Laboratory Data:  High Sensitivity Troponin:   Recent Labs  Lab 02/05/21 2251 02/06/21 0121  TROPONINIHS 25* 27*     Chemistry Recent Labs  Lab 02/05/21 2251 02/06/21 0831 02/07/21 0407  NA 129* 129* 123*  K 3.5 3.7 3.0*  CL 96* 96* 89*  CO2 24 23 23   GLUCOSE 138* 120* 119*  BUN 5* 6* 7*  CREATININE 0.46 0.41* 0.37*  CALCIUM 9.4 8.8* 8.9  GFRNONAA >60 >60 >60  ANIONGAP 9  10 11     No results for input(s): PROT, ALBUMIN, AST, ALT, ALKPHOS, BILITOT in the last 168 hours. Hematology Recent Labs  Lab 02/05/21 2251 02/06/21 0831 02/07/21 0407  WBC 10.3 11.8* 12.4*  RBC 4.67 4.35 4.59  HGB 15.5* 14.5 15.4*  HCT 46.1* 42.8 43.4  MCV 98.7 98.4 94.6  MCH 33.2 33.3 33.6  MCHC 33.6 33.9 35.5  RDW 12.4 12.3 12.1  PLT 282 263 258   BNPNo results for input(s): BNP, PROBNP in the last 168 hours.  DDimer No results for input(s): DDIMER in the last 168 hours.   Radiology/Studies:  MR CERVICAL SPINE W WO CONTRAST  Result Date: 02/06/2021 CLINICAL DATA:  75 year old female with progressive weakness and numbness in the extremities over the past 3 days. Ascending weakness and now unable to walk. 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 Postoperative day 10 status post cardiac ablation for atrial fibrillation. EXAM: MRI TOTAL SPINE WITHOUT AND WITH CONTRAST TECHNIQUE: Multisequence MR imaging of the spine from the cervical spine to the sacrum was performed prior to and following IV contrast administration for evaluation of spinal metastatic disease. CONTRAST:  6.44m GADAVIST GADOBUTROL 1 MMOL/ML IV SOLN COMPARISON:  Lumbar MRI 09/08/2020. FINDINGS: MRI CERVICAL SPINE FINDINGS Alignment: Anterolisthesis of C4 on C5 measures 3 mm. Subtle anterolisthesis of C2 on C3, C7 on T1. Otherwise mild straightening of cervical lordosis. Vertebrae: Chronic degenerative marrow signal changes in the bilateral cervical facets, lower cervical spine endplates. No marrow edema or evidence of acute osseous abnormality. Cord: Normal. No cervical spinal cord signal abnormality despite up to mild degenerative spinal cord mass effect detailed below. Following contrast there is no convincing abnormal enhancement. No cervical spine dural thickening. Posterior Fossa, vertebral arteries, paraspinal tissues: Cervicomedullary junction is  within normal limits. Negative visible posterior fossa. Negative for age other visible  brain parenchyma. Preserved major vascular flow voids in the neck. Dominant appearing right vertebral artery. Prominent paravertebral venous system appears to be incidental, physiologic. Negative visible neck soft tissues and lung apices. Disc levels: C2-C3: Moderate to severe facet hypertrophy on the right but no marrow edema. Trace facet joint fluid. Mild disc bulging/pseudo disc. No spinal stenosis. Severe right C3 foraminal stenosis. C3-C4: Mild to moderate facet hypertrophy. But no significant stenosis. C4-C5: Anterolisthesis with disc/pseudo disc bulging. Moderate to severe facet hypertrophy greater on the right. No spinal stenosis. Moderate right C5 foraminal stenosis. C5-C6: Disc space loss. Circumferential disc osteophyte complex eccentric to the left. Mild facet and ligament flavum hypertrophy greater on the left. Effaced ventral CSF space and borderline to mild spinal stenosis. Severe left C6 foraminal stenosis. C6-C7: Disc space loss with circumferential disc osteophyte complex eccentric to the left. Moderate left facet hypertrophy. Effaced ventral CSF space but no spinal stenosis. Moderate to severe left and mild right C7 foraminal stenosis. C7-T1: Subtle anterolisthesis. Negative disc. Mild to moderate facet hypertrophy. No stenosis. MRI THORACIC SPINE FINDINGS Segmentation: Hypoplastic ribs suspected at T12.  Otherwise normal. Alignment: Mildly exaggerated upper thoracic kyphosis. There is subtle anterolisthesis of T10 on T11. Vertebrae: No marrow edema or evidence of acute osseous abnormality. Visualized bone marrow signal is within normal limits. Preserved thoracic vertebral height. Cord: Normal. Capacious thoracic spinal canal at most levels. No abnormal intradural enhancement. No dural thickening. The conus medullaris appears normal at T12-L1. Paraspinal and other soft tissues: Negative. Disc levels: T1-T2: Mild to moderate facet hypertrophy but no significant stenosis. T2-T3: Mild facet  hypertrophy greater on the left. Mild left T2 foraminal stenosis. T3-T4: Mild to moderate facet hypertrophy greater on the right. Mild right T3 foraminal stenosis. T4-T5: Mild to moderate facet hypertrophy greater on the right. No significant stenosis. T5-T6: Mild to moderate facet hypertrophy greater on the left. No stenosis. T6-T7: Mild to moderate left facet hypertrophy and moderate left T6 neural foraminal stenosis. Small right T6 nerve root diverticulum. T7-T8: Negative; small right T7 nerve root diverticulum. T8-T9: Negative.  Small left T8 nerve root diverticulum. T9-T10: Negative. T10-T11: Mild anterolisthesis. Negative disc. Moderate facet hypertrophy greater on the left with trace facet joint fluid. Mild ligament flavum hypertrophy. Moderate left T10 foraminal stenosis. No spinal stenosis. T11-T12: Negative. T12-L1: Negative. MRI LUMBAR SPINE FINDINGS Segmentation:  Normal, concordant with the above numbering. Alignment: Moderate levoconvex upper and dextroconvex lower lumbar scoliosis best demonstrated on coronal imaging included with the March MRI. Associated mild retrolisthesis at both L1-L2 and L2-L3 is stable. Mild associated anterolisthesis of L5 on S1 is stable. Vertebrae: Patchy and confluent degenerative appearing marrow edema eccentric to the left at the L1 through L4 levels is stable since March. Continued faint degenerative left L5-S1 endplate marrow edema also. No new or suspicious marrow lesion identified. Intact visible sacrum and SI joints. Conus medullaris: Extends to the T12-L1 level and appears normal. There is no thickening or enhancement of the cauda equina nerve roots, which appear stable on axial T2 imaging since March. No abnormal intradural enhancement or dural thickening. Paraspinal and other soft tissues: Partially visible distended urinary bladder, at least 350 mL estimated volume. Severe diverticulosis of large bowel in the pelvis. No pelvic inflammation is evident. Negative  visible abdominal viscera. Disc levels: Advanced lumbar spine disc and endplate degeneration from L1-L2 through L5-S1 appears stable since March, notable for - mild to moderate right lateral  recess stenosis and right foraminal stenosis at L1-L2 (right L1 and L2 nerve levels), - mild to moderate bilateral foraminal stenosis at L2 (bilateral L2 nerve levels), - moderate to severe left lateral recess stenosis at L3-L4 with moderate spinal stenosis and moderate to severe right foraminal stenosis right L3 and left L4 nerve levels). -moderate to severe left foraminal stenosis at L4-L5 (left L4 nerve level). -moderate to severe left foraminal stenosis at L5-S1 (left L5 nerve level). IMPRESSION: 1. Normal spinal cord. The conus medullaris and cauda equina nerve roots appear stable since March and within normal limits. No imaging evidence of Guillain-Barre or spinal cord demyelination. 2. Advanced lumbar spine degeneration in the setting of moderate scoliosis is stable since a March MRI, including patchy lumbar vertebral body marrow edema, moderate multifactorial spinal stenosis at L3-L4, intermittent lateral recess and occasionally moderate to severe lumbar neural foraminal stenosis (left L4 and L5 nerve levels). 3. Generally mild for age thoracic spine degeneration, largely facet arthropathy. No thoracic spinal stenosis. Up to moderate associated left T6 and left T10 neural foraminal stenosis. 4. 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 5. Distended urinary bladder suspicious for Urinary Retention. Extensive diverticulosis of large bowel in the pelvis but no active inflammation. Electronically Signed   By: Genevie Ann M.D.   On: 02/06/2021 06:46   MR THORACIC SPINE W WO CONTRAST  Result Date: 02/06/2021 CLINICAL DATA:  75 year old female with  progressive weakness and numbness in the extremities over the past 3 days. Ascending weakness and now unable to walk. 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 Postoperative day 10 status post cardiac ablation for atrial fibrillation. EXAM: MRI TOTAL SPINE WITHOUT AND WITH CONTRAST TECHNIQUE: Multisequence MR imaging of the spine from the cervical spine to the sacrum was performed prior to and following IV contrast administration for evaluation of spinal metastatic disease. CONTRAST:  6.67m GADAVIST GADOBUTROL 1 MMOL/ML IV SOLN COMPARISON:  Lumbar MRI 09/08/2020. FINDINGS: MRI CERVICAL SPINE FINDINGS Alignment: Anterolisthesis of C4 on C5 measures 3 mm. Subtle anterolisthesis of C2 on C3, C7 on T1. Otherwise mild straightening of cervical lordosis. Vertebrae: Chronic degenerative marrow signal changes in the bilateral cervical facets, lower cervical spine endplates. No marrow edema or evidence of acute osseous abnormality. Cord: Normal. No cervical spinal cord signal abnormality despite up to mild degenerative spinal cord mass effect detailed below. Following contrast there is no convincing abnormal enhancement. No cervical spine dural thickening. Posterior Fossa, vertebral arteries, paraspinal tissues: Cervicomedullary junction is within normal limits. Negative visible posterior fossa. Negative for age other visible brain parenchyma. Preserved major vascular flow voids in the neck. Dominant appearing right vertebral artery. Prominent paravertebral venous system appears to be incidental, physiologic. Negative visible neck soft tissues and lung apices. Disc levels: C2-C3: Moderate to severe facet hypertrophy on the right but no marrow edema. Trace facet joint fluid. Mild disc bulging/pseudo disc. No spinal stenosis. Severe right C3 foraminal stenosis. C3-C4: Mild to moderate facet hypertrophy. But no significant stenosis. C4-C5: Anterolisthesis with disc/pseudo disc bulging. Moderate to severe facet hypertrophy greater on the right.  No spinal stenosis. Moderate right C5 foraminal stenosis. C5-C6: Disc space loss. Circumferential disc osteophyte complex eccentric to the left. Mild facet and ligament flavum hypertrophy greater on the left. Effaced ventral CSF space and borderline to mild spinal stenosis. Severe left C6 foraminal stenosis. C6-C7: Disc space loss with  circumferential disc osteophyte complex eccentric to the left. Moderate left facet hypertrophy. Effaced ventral CSF space but no spinal stenosis. Moderate to severe left and mild right C7 foraminal stenosis. C7-T1: Subtle anterolisthesis. Negative disc. Mild to moderate facet hypertrophy. No stenosis. MRI THORACIC SPINE FINDINGS Segmentation: Hypoplastic ribs suspected at T12.  Otherwise normal. Alignment: Mildly exaggerated upper thoracic kyphosis. There is subtle anterolisthesis of T10 on T11. Vertebrae: No marrow edema or evidence of acute osseous abnormality. Visualized bone marrow signal is within normal limits. Preserved thoracic vertebral height. Cord: Normal. Capacious thoracic spinal canal at most levels. No abnormal intradural enhancement. No dural thickening. The conus medullaris appears normal at T12-L1. Paraspinal and other soft tissues: Negative. Disc levels: T1-T2: Mild to moderate facet hypertrophy but no significant stenosis. T2-T3: Mild facet hypertrophy greater on the left. Mild left T2 foraminal stenosis. T3-T4: Mild to moderate facet hypertrophy greater on the right. Mild right T3 foraminal stenosis. T4-T5: Mild to moderate facet hypertrophy greater on the right. No significant stenosis. T5-T6: Mild to moderate facet hypertrophy greater on the left. No stenosis. T6-T7: Mild to moderate left facet hypertrophy and moderate left T6 neural foraminal stenosis. Small right T6 nerve root diverticulum. T7-T8: Negative; small right T7 nerve root diverticulum. T8-T9: Negative.  Small left T8 nerve root diverticulum. T9-T10: Negative. T10-T11: Mild anterolisthesis. Negative  disc. Moderate facet hypertrophy greater on the left with trace facet joint fluid. Mild ligament flavum hypertrophy. Moderate left T10 foraminal stenosis. No spinal stenosis. T11-T12: Negative. T12-L1: Negative. MRI LUMBAR SPINE FINDINGS Segmentation:  Normal, concordant with the above numbering. Alignment: Moderate levoconvex upper and dextroconvex lower lumbar scoliosis best demonstrated on coronal imaging included with the March MRI. Associated mild retrolisthesis at both L1-L2 and L2-L3 is stable. Mild associated anterolisthesis of L5 on S1 is stable. Vertebrae: Patchy and confluent degenerative appearing marrow edema eccentric to the left at the L1 through L4 levels is stable since March. Continued faint degenerative left L5-S1 endplate marrow edema also. No new or suspicious marrow lesion identified. Intact visible sacrum and SI joints. Conus medullaris: Extends to the T12-L1 level and appears normal. There is no thickening or enhancement of the cauda equina nerve roots, which appear stable on axial T2 imaging since March. No abnormal intradural enhancement or dural thickening. Paraspinal and other soft tissues: Partially visible distended urinary bladder, at least 350 mL estimated volume. Severe diverticulosis of large bowel in the pelvis. No pelvic inflammation is evident. Negative visible abdominal viscera. Disc levels: Advanced lumbar spine disc and endplate degeneration from L1-L2 through L5-S1 appears stable since March, notable for - mild to moderate right lateral recess stenosis and right foraminal stenosis at L1-L2 (right L1 and L2 nerve levels), - mild to moderate bilateral foraminal stenosis at L2 (bilateral L2 nerve levels), - moderate to severe left lateral recess stenosis at L3-L4 with moderate spinal stenosis and moderate to severe right foraminal stenosis right L3 and left L4 nerve levels). -moderate to severe left foraminal stenosis at L4-L5 (left L4 nerve level). -moderate to severe left  foraminal stenosis at L5-S1 (left L5 nerve level). IMPRESSION: 1. Normal spinal cord. The conus medullaris and cauda equina nerve roots appear stable since March and within normal limits. No imaging evidence of Guillain-Barre or spinal cord demyelination. 2. Advanced lumbar spine degeneration in the setting of moderate scoliosis is stable since a March MRI, including patchy lumbar vertebral body marrow edema, moderate multifactorial spinal stenosis at L3-L4, intermittent lateral recess and occasionally moderate to severe lumbar neural foraminal stenosis (  left L4 and L5 nerve levels). 3. Generally mild for age thoracic spine degeneration, largely facet arthropathy. No thoracic spinal stenosis. Up to moderate associated left T6 and left T10 neural foraminal stenosis. 4. 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 5. Distended urinary bladder suspicious for Urinary Retention. Extensive diverticulosis of large bowel in the pelvis but no active inflammation. Electronically Signed   By: Genevie Ann M.D.   On: 02/06/2021 06:46   MR Lumbar Spine W Wo Contrast  Result Date: 02/06/2021 CLINICAL DATA:  75 year old female with progressive weakness and numbness in the extremities over the past 3 days. Ascending weakness and now unable to walk. 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 Postoperative day 10 status post cardiac ablation for atrial fibrillation. EXAM: MRI TOTAL SPINE WITHOUT AND WITH CONTRAST TECHNIQUE: Multisequence MR imaging of the spine from the cervical spine to the sacrum was performed prior to and following IV contrast administration for evaluation of spinal metastatic disease. CONTRAST:  6.70m GADAVIST GADOBUTROL 1 MMOL/ML IV SOLN COMPARISON:  Lumbar MRI 09/08/2020. FINDINGS: MRI CERVICAL SPINE FINDINGS Alignment: Anterolisthesis of C4 on C5 measures 3  mm. Subtle anterolisthesis of C2 on C3, C7 on T1. Otherwise mild straightening of cervical lordosis. Vertebrae: Chronic degenerative marrow signal changes in the bilateral cervical facets, lower cervical spine endplates. No marrow edema or evidence of acute osseous abnormality. Cord: Normal. No cervical spinal cord signal abnormality despite up to mild degenerative spinal cord mass effect detailed below. Following contrast there is no convincing abnormal enhancement. No cervical spine dural thickening. Posterior Fossa, vertebral arteries, paraspinal tissues: Cervicomedullary junction is within normal limits. Negative visible posterior fossa. Negative for age other visible brain parenchyma. Preserved major vascular flow voids in the neck. Dominant appearing right vertebral artery. Prominent paravertebral venous system appears to be incidental, physiologic. Negative visible neck soft tissues and lung apices. Disc levels: C2-C3: Moderate to severe facet hypertrophy on the right but no marrow edema. Trace facet joint fluid. Mild disc bulging/pseudo disc. No spinal stenosis. Severe right C3 foraminal stenosis. C3-C4: Mild to moderate facet hypertrophy. But no significant stenosis. C4-C5: Anterolisthesis with disc/pseudo disc bulging. Moderate to severe facet hypertrophy greater on the right. No spinal stenosis. Moderate right C5 foraminal stenosis. C5-C6: Disc space loss. Circumferential disc osteophyte complex eccentric to the left. Mild facet and ligament flavum hypertrophy greater on the left. Effaced ventral CSF space and borderline to mild spinal stenosis. Severe left C6 foraminal stenosis. C6-C7: Disc space loss with circumferential disc osteophyte complex eccentric to the left. Moderate left facet hypertrophy. Effaced ventral CSF space but no spinal stenosis. Moderate to severe left and mild right C7 foraminal stenosis. C7-T1: Subtle anterolisthesis. Negative disc. Mild to moderate facet hypertrophy. No stenosis.  MRI THORACIC SPINE FINDINGS Segmentation: Hypoplastic ribs suspected at T12.  Otherwise normal. Alignment: Mildly exaggerated upper thoracic kyphosis. There is subtle anterolisthesis of T10 on T11. Vertebrae: No marrow edema or evidence of acute osseous abnormality. Visualized bone marrow signal is within normal limits. Preserved thoracic vertebral height. Cord: Normal. Capacious thoracic spinal canal at most levels. No abnormal intradural enhancement. No dural thickening. The conus medullaris appears normal at T12-L1. Paraspinal and other soft tissues: Negative. Disc levels: T1-T2: Mild to moderate facet hypertrophy but no significant stenosis. T2-T3: Mild facet hypertrophy greater on the left. Mild left T2 foraminal stenosis. T3-T4: Mild to moderate facet hypertrophy  greater on the right. Mild right T3 foraminal stenosis. T4-T5: Mild to moderate facet hypertrophy greater on the right. No significant stenosis. T5-T6: Mild to moderate facet hypertrophy greater on the left. No stenosis. T6-T7: Mild to moderate left facet hypertrophy and moderate left T6 neural foraminal stenosis. Small right T6 nerve root diverticulum. T7-T8: Negative; small right T7 nerve root diverticulum. T8-T9: Negative.  Small left T8 nerve root diverticulum. T9-T10: Negative. T10-T11: Mild anterolisthesis. Negative disc. Moderate facet hypertrophy greater on the left with trace facet joint fluid. Mild ligament flavum hypertrophy. Moderate left T10 foraminal stenosis. No spinal stenosis. T11-T12: Negative. T12-L1: Negative. MRI LUMBAR SPINE FINDINGS Segmentation:  Normal, concordant with the above numbering. Alignment: Moderate levoconvex upper and dextroconvex lower lumbar scoliosis best demonstrated on coronal imaging included with the March MRI. Associated mild retrolisthesis at both L1-L2 and L2-L3 is stable. Mild associated anterolisthesis of L5 on S1 is stable. Vertebrae: Patchy and confluent degenerative appearing marrow edema eccentric  to the left at the L1 through L4 levels is stable since March. Continued faint degenerative left L5-S1 endplate marrow edema also. No new or suspicious marrow lesion identified. Intact visible sacrum and SI joints. Conus medullaris: Extends to the T12-L1 level and appears normal. There is no thickening or enhancement of the cauda equina nerve roots, which appear stable on axial T2 imaging since March. No abnormal intradural enhancement or dural thickening. Paraspinal and other soft tissues: Partially visible distended urinary bladder, at least 350 mL estimated volume. Severe diverticulosis of large bowel in the pelvis. No pelvic inflammation is evident. Negative visible abdominal viscera. Disc levels: Advanced lumbar spine disc and endplate degeneration from L1-L2 through L5-S1 appears stable since March, notable for - mild to moderate right lateral recess stenosis and right foraminal stenosis at L1-L2 (right L1 and L2 nerve levels), - mild to moderate bilateral foraminal stenosis at L2 (bilateral L2 nerve levels), - moderate to severe left lateral recess stenosis at L3-L4 with moderate spinal stenosis and moderate to severe right foraminal stenosis right L3 and left L4 nerve levels). -moderate to severe left foraminal stenosis at L4-L5 (left L4 nerve level). -moderate to severe left foraminal stenosis at L5-S1 (left L5 nerve level). IMPRESSION: 1. Normal spinal cord. The conus medullaris and cauda equina nerve roots appear stable since March and within normal limits. No imaging evidence of Guillain-Barre or spinal cord demyelination. 2. Advanced lumbar spine degeneration in the setting of moderate scoliosis is stable since a March MRI, including patchy lumbar vertebral body marrow edema, moderate multifactorial spinal stenosis at L3-L4, intermittent lateral recess and occasionally moderate to severe lumbar neural foraminal stenosis (left L4 and L5 nerve levels). 3. Generally mild for age thoracic spine  degeneration, largely facet arthropathy. No thoracic spinal stenosis. Up to moderate associated left T6 and left T10 neural foraminal stenosis. 4. 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 5. Distended urinary bladder suspicious for Urinary Retention. Extensive diverticulosis of large bowel in the pelvis but no active inflammation. Electronically Signed   By: Genevie Ann M.D.   On: 02/06/2021 06:46   DG Chest Port 1 View  Result Date: 02/05/2021 CLINICAL DATA:  Chest pain EXAM: PORTABLE CHEST 1 VIEW COMPARISON:  03/08/2016 FINDINGS: Mild hyperinflation. Heart and mediastinal contours are within normal limits. No focal opacities or effusions. No acute bony abnormality. Aortic atherosclerosis. IMPRESSION: Hyperinflation.  No active disease. Electronically Signed   By: Rolm Baptise M.D.   On: 02/05/2021 23:30   CT Angio Chest/Abd/Pel for Dissection W and/or Wo Contrast  Result Date: 02/06/2021 CLINICAL DATA:  Chest and back pain, dissection suspected EXAM: CT ANGIOGRAPHY CHEST, ABDOMEN AND PELVIS TECHNIQUE: Non-contrast CT of the chest was initially obtained. Multidetector CT imaging through the chest, abdomen and pelvis was performed using the standard protocol during bolus administration of intravenous contrast. Multiplanar reconstructed images and MIPs were obtained and reviewed to evaluate the vascular anatomy. CONTRAST:  112m OMNIPAQUE IOHEXOL 350 MG/ML SOLN COMPARISON:  Lumbar MRI 09/08/2020 FINDINGS: CTA CHEST FINDINGS Cardiovascular: Initial noncontrast CT imaging through the chest reveals an atherosclerotic thoracic aorta without hyperdense mural thickening or plaque displacement to suggest intramural hematoma. Postcontrast administration there is satisfactory arterial phase enhancement for assessment of the arterial vasculature.  Insert air aortic root The aorta is normal caliber. No acute luminal abnormality of the imaged aorta. No periaortic stranding or hemorrhage. 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 Proximal great vessels are otherwise unremarkable and free of acute abnormality. Biatrial enlargement. Otherwise normal cardiac size. No pericardial effusion. Coronary atherosclerosis. Central pulmonary arteries are normal caliber. No large central or lobar pulmonary arterial filling defects with more distal evaluation limited by this non tailored exam. No major venous abnormalities. Mediastinum/Nodes: No mediastinal fluid or gas. Normal thyroid gland and thoracic inlet. No acute abnormality of the trachea or thoracic esophagus. No worrisome mediastinal, hilar or axillary adenopathy. Lungs/Pleura: Airways patent. Atelectatic changes. No consolidation, features of edema, pneumothorax, or effusion. No suspicious pulmonary nodules or masses. Mild dependent atelectasis. Musculoskeletal: No acute osseous abnormality or suspicious osseous lesion. Multilevel degenerative changes are present in the imaged portions of the spine. Additional degenerative changes in the shoulders. No worrisome chest wall mass or lesion. Review of the MIP images confirms the above findings. CTA ABDOMEN AND PELVIS FINDINGS VASCULAR Aorta: Calcified noncalcified atheromatous plaque in the normal caliber aorta without aneurysm, dissection, vasculitis or significant stenosis. Celiac: Mild ostial plaque narrowing. Otherwise widely patent without evidence of aneurysm, dissection, vasculitis or significant stenosis. SMA: Mild ostial plaque narrowing. Otherwise patent without evidence of aneurysm, dissection, vasculitis or significant stenosis. Renals: Single renal arteries bilaterally. At most mild left ostial plaque narrowing. Both renal arteries otherwise appear widely patent without aneurysm, dissection, vasculitis or  features of fibromuscular dysplasia. IMA: Patent without evidence of aneurysm, dissection, vasculitis or significant stenosis. Inflow: Minimal atherosclerotic plaque throughout the inflow vasculature most pronounced in the common and internal segments. No flow-limiting stenosis or occlusion. No evidence of aneurysm, dissection or vasculitis. Included portion of the proximal outflow including the common, superficial and deep femoral arteries appear grossly unremarkable without acute luminal abnormality, significant stenosis or occlusion. Veins: No major venous abnormality within limitations of this arterial phase exam. Review of the MIP images confirms the above findings. NON-VASCULAR Hepatobiliary: Diffuse hepatic hypoattenuation possibly related to contrast timing or fatty infiltration. Gallbladder is mildly distended. Question some very faint pericholecystic inflammation is noted particular towards the fundus. Pancreas: No pancreatic ductal dilatation or surrounding inflammatory changes. Spleen: Diminutive spleen, nonspecific.  No concerning splenic mass. Adrenals/Urinary Tract: Normal adrenals. Kidneys are normally located with symmetric enhancement. No suspicious renal lesion, urolithiasis or hydronephrosis. Bladder is unremarkable for the degree of distention. Stomach/Bowel: Distal esophagus and stomach are unremarkable. Large air and fluid-filled duodenal diverticulum without adjacent inflammation (7/178) measuring up to 3.8 cm in size. Normal duodenal sweep across the midline abdomen.  No small bowel thickening or dilatation. Prior appendectomy. Extensive distal colonic diverticulosis and mild mural thickening of the affected segment of sigmoid colon albeit without focal inflammation to suggest an acute inflammatory process. No evidence of obstruction. Lymphatic: No suspicious or enlarged lymph nodes in the included lymphatic chains. Reproductive: Uterus is surgically absent. No concerning adnexal lesions.  Other: No abdominopelvic free fluid or free gas. No bowel containing hernias. Musculoskeletal: No acute osseous abnormality or suspicious osseous lesion. Multilevel degenerative changes are present in the imaged portions of the spine. Retrolisthesis L2 on L3., anterolisthesis L5 on S1. Multilevel discogenic and facet degenerative changes appear grossly similar to comparison MR . Levocurvature of the lumbar spine with pelvic tilt. Degenerative changes in the hips and pelvis. Review of the MIP images confirms the above findings. IMPRESSION: 1. No evidence of aortic dissection or other acute aortic syndrome. 2. Minimal atelectatic changes in the lungs. No other acute intrathoracic process. 3. Gallbladder distension with some faint pericholecystic stranding towards the fundus. Could reflect an early or developing acute cholecystitis. Correlate with serologies and further imaging with right upper quadrant ultrasound. 4. Large air-filled duodenal diverticulum without focal inflammation to suggest an inflammatory change at this time. 5. Segmental thickening of the sigmoid in a region of numerous colonic diverticula without adjacent stranding or phlegmon. Can reflect sequela of prior inflammation or a more chronic smoldering inflammation such as segmental colitis associated with diverticulosis. Correlate with clinical symptoms and consider outpatient direct visualization if not recently performed 6. Biatrial enlargement and coronary artery atherosclerosis. 7. Aortic Atherosclerosis (ICD10-I70.0). Mild ostial plaque narrowing of the celiac axis, SMA and left renal artery origin. Electronically Signed   By: Lovena Le M.D.   On: 02/06/2021 01:38   [place] EKG SITE RITE  Result Date: 02/06/2021 If Site Rite image not attached, placement could not be confirmed due to current cardiac rhythm.  US Abdomen Limited RUQ (LIVER/GB)  Result Date: 02/06/2021 CLINICAL DATA:  Abnormal CT with distended gallbladder and slight  pericholecystic stranding. EXAM: ULTRASOUND ABDOMEN LIMITED RIGHT UPPER QUADRANT COMPARISON:  CT head Nov 06, 2020 FINDINGS: Gallbladder: Distended gallbladder. No gallbladder wall thickening or pericholecystic fluid. Negative for gallstones. Negative sonographic Murphy sign Common bile duct: Diameter: 6.3 mm. Liver: Mild increased echogenicity liver diffusely without focal liver lesion. Portal vein is patent on color Doppler imaging with normal direction of blood flow towards the liver. Other: None. IMPRESSION: Distended gallbladder without gallstones or pericholecystic fluid. No evidence of cholecystitis. No biliary dilatation. Electronically Signed   By: Franchot Gallo M.D.   On: 02/06/2021 07:47     Assessment and Plan:   Ms Bassette is a very pleasant 75yo woman with history of AF, DDD and celiac dz who was admitted 02/06/2021 with rapidly progressing ascending weakness and sensory loss starting in her hands/feet shortly after an atrial fibrillation ablation on 01/28/2021. Since admission she had several episodes of AF/AFL with RVR and is now on a dilt gtt maintaining sinus rhythm.  AF/AFL Agree with diltiazem gtt for now.  Recommend continuing IV heparin for anticoagulation for now given very recent ablation procedure and risk of thrombus formation post ablation.  I did discuss case with neurology PA/NP at the bedside and Dr Lupita Leash via phone.  EP will follow along.  For questions or updates, please contact [place] Please consult [email or web address] for contact info under    Signed, Vickie Epley, MD  02/07/2021 9:52 AM

## 2021-02-07 NOTE — Progress Notes (Signed)
Attempted to call report to [place].  Left phone number with secretary

## 2021-02-07 NOTE — H&P (Signed)
NIF-24

## 2021-02-07 NOTE — Progress Notes (Signed)
RT note  FVC 1.14 L NIF -22  Good Effort. Sitting Upright.

## 2021-02-07 NOTE — Evaluation (Signed)
Physical Therapy Evaluation Patient Details Name: Angela Morales MRN: [id number] DOB: 05-14-1946 Today's Date: 02/07/2021   History of Present Illness  Pt is a 75 y.o. 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 Pt also with afib with RVR. Eliquis on hold for possible need of LP; awaiting neurology consult. PMH includes afib (recent ablation 01/28/21), celiac disease, DDD, gluacoma, L TKA (2017).   Clinical Impression  Pt presents with an overall decrease in functional mobility secondary to above. PTA, pt independent, retired from work, lives with supportive family. Today, pt presents with diffuse global weakness (distal>proximal), decreased sensation, and impaired balance; pt requires max-totalA for bed mobility and transfer to recliner, difficulty maintaining seated balance.  Pt will require intensive post-acute rehab services to maximize functional mobility and independence prior to return home. Will follow acutely to address established goals.    Follow Up Recommendations CIR;Supervision for mobility/OOB    Equipment Recommendations   (TBD)    Recommendations for Other Services       Precautions / Restrictions Precautions Precautions: Fall;Other (comment) Precaution Comments: Incontinence Restrictions Weight Bearing Restrictions: No      Mobility  Bed Mobility Overal bed mobility: Needs Assistance Bed Mobility: Supine to Sit     Supine to sit: Max assist;HOB elevated;Total assist     General bed mobility comments: Pt able to minimally assist with rolling and trunk elevation, totalA for BLE management, maxA for trunk elevation and scooting hips to EOB    Transfers Overall transfer level: Needs assistance Equipment used: None Transfers: Lateral/Scoot Transfers          Lateral/Scoot Transfers: Max assist;Total assist General transfer comment: Max-totalA for lateral scoot transfer from EOB to drop  arm recliner with use of bed pad; pt able to intermittently assist with trunk flexion and BUE placement, but ultimately dependent for BLE management and trunk support  Ambulation/Gait             General Gait Details: unable this session  Stairs            Wheelchair Mobility    Modified Rankin (Stroke Patients Only)       Balance Overall balance assessment: Needs assistance   Sitting balance-Leahy Scale: Poor Sitting balance - Comments: Able to maintain seated balance without assist for brief bouts, intermittent min-modA; can lean slightly in all directions with ability to correc to midline (except requires assist for trunk extension back to midline); unbale to lean outside BOS without LOB                                     Pertinent Vitals/Pain Pain Assessment: Faces Faces Pain Scale: Hurts little more Pain Location: Mid/lower back, scapula, posterior knees Pain Descriptors / Indicators: Discomfort;Tightness Pain Intervention(s): Monitored during session;Limited activity within patient's tolerance;Repositioned    Home Living Family/patient expects to be discharged to:: Private residence Living Arrangements: Spouse/significant other;Children Available Help at Discharge: Family;Available PRN/intermittently Type of Home: House Home Access: Stairs to enter Entrance Stairs-Rails: Left Entrance Stairs-Number of Steps: 3-4 Home Layout: Multi-level;Able to live on main level with bedroom/bathroom;Full bath on main level Home Equipment: None Additional Comments: Lives with husband and son; husband works    Prior Function Level of Independence: Independent         Comments: Independent without DME; retired Press photographer; drives; enjoys babysitting grandkids (70 and 75 y.o.), enjoys time  with cat and dog, watching tv     Hand Dominance   Dominant Hand: Left    Extremity/Trunk Assessment   Upper Extremity Assessment Upper  Extremity Assessment: RUE deficits/detail;LUE deficits/detail RUE Deficits / Details: <3/5 strength throughout BUEs; pt endorses numbness/tingling in forearms and hands; suspect coordination deficits RUE Sensation: decreased light touch;decreased proprioception RUE Coordination: decreased gross motor;decreased fine motor LUE Deficits / Details: <3/5 strength throughout BUEs; pt endorses numbness/tingling in forearms and hands; suspect coordination deficits LUE Sensation: decreased light touch;decreased proprioception LUE Coordination: decreased fine motor;decreased gross motor    Lower Extremity Assessment Lower Extremity Assessment: RLE deficits/detail;LLE deficits/detail RLE Deficits / Details: BLEs grossly 0/5 toes, 1/5 ankle DF, 2/5 ankle PF, 2/5 knee extension, 2/5 hip abd/add, 1/5 hip flex; pt endorses no sensation on toes and plantar surface foot, light touch diminished throughout lower legs/feet (worse on L side); bilateral feet wanting to rest inverted and plantar flexed RLE Sensation: decreased light touch;decreased proprioception RLE Coordination: decreased fine motor;decreased gross motor LLE Deficits / Details: BLEs grossly 0/5 toes, 1/5 ankle DF, 2/5 ankle PF, 2/5 knee extension, 2/5 hip abd/add, 1/5 hip flex; pt endorses no sensation on toes and plantar surface foot, light touch diminished throughout lower legs/feet (worse on L side); bilateral feet wanting to rest inverted and plantar flexed LLE Sensation: decreased light touch;decreased proprioception LLE Coordination: decreased fine motor;decreased gross motor    Cervical / Trunk Assessment Cervical / Trunk Assessment: Other exceptions Cervical / Trunk Exceptions: H/o scoliosis and arthritis; decreased trunk control flexion strength greater than extension, but still with significant proximal weakness  Communication   Communication: Expressive difficulties (hoarse, soft voice; pt reports not baseline)  Cognition  Arousal/Alertness: Awake/alert Behavior During Therapy: WFL for tasks assessed/performed Overall Cognitive Status: Within Functional Limits for tasks assessed                                        General Comments General comments (skin integrity, edema, etc.): VSS on RA. Husband present and supportive. Initiated discussion regarding d/c planning; pt falling asleep by end of session during this discussion. Verbal order from MD, prevalon boots ordered (RN aware)    Exercises     Assessment/Plan    PT Assessment Patient needs continued PT services  PT Problem List Decreased strength;Decreased range of motion;Decreased activity tolerance;Decreased balance;Decreased mobility;Decreased coordination;Decreased knowledge of use of DME;Decreased knowledge of precautions;Impaired sensation;Impaired tone       PT Treatment Interventions DME instruction;Gait training;Functional mobility training;Stair training;Therapeutic activities;Therapeutic exercise;Balance training;Neuromuscular re-education;Cognitive remediation;Patient/family education;Wheelchair mobility training    PT Goals (Current goals can be found in the Care Plan section)  Acute Rehab PT Goals Patient Stated Goal: Return home, ("I know it's going to be a long process") PT Goal Formulation: With patient/family Time For Goal Achievement: 02/21/21 Potential to Achieve Goals: Good    Frequency Min 4X/week   Barriers to discharge        Co-evaluation               AM-PAC PT "6 Clicks" Mobility  Outcome Measure Help needed turning from your back to your side while in a flat bed without using bedrails?: A Lot Help needed moving from lying on your back to sitting on the side of a flat bed without using bedrails?: Total Help needed moving to and from a bed to a chair (including a wheelchair)?: Total Help needed standing  up from a chair using your arms (e.g., wheelchair or bedside chair)?: Total Help needed to  walk in hospital room?: Total Help needed climbing 3-5 steps with a railing? : Total 6 Click Score: 7    End of Session Equipment Utilized During Treatment: Gait belt Activity Tolerance: Patient tolerated treatment well;Patient limited by fatigue Patient left: in chair;with call bell/phone within reach;with chair alarm set;with family/visitor present Nurse Communication: Mobility status;Need for lift equipment;Other (comment) (prevalon boots ordered) PT Visit Diagnosis: Other abnormalities of gait and mobility (R26.89);Muscle weakness (generalized) (M62.81);Other symptoms and signs involving the nervous system (R29.898);Hemiplegia and hemiparesis Hemiplegia - caused by: Unspecified    Time: 6979-4801 PT Time Calculation (min) (ACUTE ONLY): 36 min   Charges:   PT Evaluation $PT Eval Moderate Complexity: 1 Mod PT Treatments $Therapeutic Activity: 8-22 mins      Mabeline Caras, PT, DPT Acute Rehabilitation Services  Pager [phone] Office [place] 02/07/2021, 12:25 PM

## 2021-02-07 NOTE — Progress Notes (Signed)
[id number] VC 1.3L

## 2021-02-07 NOTE — Progress Notes (Signed)
Peripherally Inserted Central Catheter Placement  The IV Nurse has discussed with the patient and/or persons authorized to consent for the patient, the purpose of this procedure and the potential benefits and risks involved with this procedure.  The benefits include less needle sticks, lab draws from the catheter, and the patient may be discharged home with the catheter. Risks include, but not limited to, infection, bleeding, blood clot (thrombus formation), and puncture of an artery; nerve damage and irregular heartbeat and possibility to perform a PICC exchange if needed/ordered by physician.  Alternatives to this procedure were also discussed.  Bard Power PICC patient education guide, fact sheet on infection prevention and patient information card has been provided to patient /or left at bedside. BUE weakness, unable to hold  a pen, witnessed verbal consent by Pamala Hurry RN   PICC Placement Documentation  PICC Double Lumen 19/37/90 PICC Right Basilic 36 cm 0 cm (Active)  Indication for Insertion or Continuance of Line Prolonged intravenous therapies;Limited venous access - need for IV therapy >5 days (PICC only) 02/07/21 0904  Exposed Catheter (cm) 0 cm 02/07/21 0904  Site Assessment Clean;Dry;Intact 02/07/21 0904  Lumen #1 Status Flushed;Saline locked;Blood return noted 02/07/21 0904  Lumen #2 Status Flushed;Saline locked;Blood return noted 02/07/21 0904  Dressing Type Transparent 02/07/21 0904  Dressing Status Clean;Dry;Intact 02/07/21 0904  Antimicrobial disc in place? Yes 02/07/21 0904  Safety Lock Not Applicable 24/09/73 5329  Line Care Connections checked and tightened 02/07/21 0904  Line Adjustment (NICU/IV Team Only) Yes 02/07/21 0904  Dressing Intervention New dressing 02/07/21 0904  Dressing Change Due 02/14/21 02/07/21 0904       Rolena Infante 02/07/2021, 9:05 AM

## 2021-02-07 NOTE — Progress Notes (Signed)
ANTICOAGULATION CONSULT NOTE  Pharmacy Consult for heparin Indication: atrial fibrillation  Allergies  Allergen Reactions   Gluten Meal Other (See Comments)    celiac disease   Cefdinir Rash    Patient Measurements: Weight: 66.1 kg (145 lb 11.6 oz) Heparin Dosing Weight: 64kg  Vital Signs: Temp: 97.7 F (36.5 C) (08/13 0300) Temp Source: Oral (08/13 0300) BP: 136/68 (08/13 0530) Pulse Rate: 69 (08/13 0530)  Labs: Recent Labs    02/05/21 2251 02/06/21 0121 02/06/21 0831 02/07/21 0407 02/07/21 0508  HGB 15.5*  --  14.5 15.4*  --   HCT 46.1*  --  42.8 43.4  --   PLT 282  --  263 258  --   APTT  --   --   --   --  88*  HEPARINUNFRC  --   --   --  1.00*  --   CREATININE 0.46  --  0.41* 0.37*  --   TROPONINIHS 25* 27*  --   --   --      Estimated Creatinine Clearance: 56.9 mL/min (A) (by C-G formula based on SCr of 0.37 mg/dL (L)).  Assessment: 75 y.o. female with h/o Afib, Eliquis on hold, for heparin  Goal of Therapy:  Heparin level 0.3-0.7 units/ml aPTT 66-102 seconds Monitor platelets by anticoagulation protocol: Yes   Plan:  Continue Heparin at current rate   Phillis Knack, PharmD, BCPS  02/07/2021 6:01 AM

## 2021-02-07 NOTE — Progress Notes (Signed)
RT note FVC 1.32L NIF -20  Good Effort. Sitting Upright

## 2021-02-07 NOTE — Progress Notes (Signed)
Patient unable to feed self, while assisting with clear liquids, patient was unable to swallow apple juice without coughing and unable to manipulate yogurt in mouth and ended up spitting down the front of her gown.  Made patient immediately NPO and put in order for a swallow evaluation.  Notified Dr. Maren Beach

## 2021-02-07 NOTE — Progress Notes (Signed)
NIF -24 CMH20 VC .75L

## 2021-02-07 NOTE — Progress Notes (Signed)
Neurology Progress Note  Brief HPI: 75 y.o. 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  Subjective: Complains of trouble swallowing this morning with hoarseness and increased phlegm NIF overnight - 20, -22, -25 this morning VC overnight 1.32, 1.14, 1.24 this morning IVIG 1/5 completed overnight without complications  Exam: Vitals:   02/07/21 0530 02/07/21 0732  BP: 136/68 (!) 143/74  Pulse: 69 77  Resp: 20   Temp:    SpO2: 96% 96%   Gen: Laying in bed, in no acute distress Resp: non-labored breathing, no respiratory distress, SpO2 96% on monitor Abd: soft, non-tender, non-distended  Neuro: Mental Status: Awake, alert, and oriented to person, place, time, and situation. She is able to provide a clear and coherent history of present illness.  Speech is hypophonic and hoarse this morning, a reported acute change from yesterday.  Naming, repetition, fluency, and comprehension are intact.  No neglect is noted.  Cranial Nerves: PERRL, EOMI without ptosis or nystagmus, visual fields are full, facial sensation is intact and symmetric to light touch, face is symmetric resting and with movement, hearing is intact to voice, 5/5 head turn against resistance, palate elevates symmetrically, tongue protrudes midline without fasciculations.  Motor: Significantly progressive weakness noted throughout each extremity.  Bilateral upper extremities 3/5 strength with 3/5 grips bilaterally.  Bilateral lower extremities 2/5 strength at the ankles with plantar/dorsiflexion, able to initiate antigravity movement but is unable to sustain  antigravity position. 2/5 strength hip flexors bilaterally, 2/5 knee flexion and extension.  Sensory: Severely diminished in bilateral lower extremities, also has a glove type sensory diminished pattern in the upper extremities DTR: Areflexic in the legs, 1+ brachioradialis and biceps bilaterally Gait: Deferred  Pertinent Labs: CBC    Component Value Date/Time   WBC 12.4 (H) 02/07/2021 0407   RBC 4.59 02/07/2021 0407   HGB 15.4 (H) 02/07/2021 0407   HGB 16.0 (H) 01/22/2021 1138   HCT 43.4 02/07/2021 0407   HCT 48.2 (H) 01/22/2021 1138   PLT 258 02/07/2021 0407   PLT 210 01/22/2021 1138   MCV 94.6 02/07/2021 0407   MCV 101 (H) 01/22/2021 1138   MCH 33.6 02/07/2021 0407   MCHC 35.5 02/07/2021 0407   RDW 12.1 02/07/2021 0407   RDW 12.0 01/22/2021 1138   LYMPHSABS 1.5 02/05/2021 2251   MONOABS 0.5 02/05/2021 2251   EOSABS 0.0 02/05/2021 2251   BASOSABS 0.0 02/05/2021 2251   CMP     Component Value Date/Time   NA 123 (L) 02/07/2021 0407   NA 137 01/22/2021 1138   K 3.0 (L) 02/07/2021 0407   CL 89 (L) 02/07/2021 0407   CO2 23 02/07/2021 0407   GLUCOSE 119 (H) 02/07/2021 0407   BUN 7 (L) 02/07/2021 0407   BUN 8 01/22/2021 1138   CREATININE 0.37 (L) 02/07/2021 0407   CALCIUM 8.9 02/07/2021 0407   PROT 7.8 07/21/2016 1022   ALBUMIN 4.0 07/21/2016 1022   AST 25 07/21/2016 1022   ALT 17 07/21/2016 1022   ALKPHOS 75 07/21/2016 1022   BILITOT 0.3 07/21/2016 1022   GFRNONAA >60 02/07/2021 0407   GFRAA >60 03/23/2016 0315   AChR and MUSK  antibodies pending  Imaging Reviewed:  MRI Cervical, thoracic and lumbar spine w/ and w/o contrast 02/06/2021: 1. Normal spinal cord. The conus medullaris and cauda equina nerve roots appear stable since March and within normal limits. No imaging evidence of Guillain-Barre or spinal cord demyelination. 2. Advanced lumbar spine degeneration in the setting of moderate scoliosis is stable since a March MRI, including patchy lumbar vertebral body  marrow edema, moderate multifactorial spinal stenosis at L3-L4, intermittent lateral recess and occasionally moderate to severe lumbar neural foraminal stenosis (left L4 and L5 nerve levels). 3. Generally mild for age thoracic spine degeneration, largely facet arthropathy. No thoracic spinal stenosis. Up to moderate associated left T6 and left T10 neural foraminal stenosis. 4. 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 5. Distended urinary bladder suspicious for Urinary Retention. Extensive diverticulosis of large bowel in the pelvis but no active inflammation.  Assessment: 75 y.o. female who presented for evaluation of progressive sensory deficits throughout and progressive ascending weakness for 4 days affecting her ambulation.   Given the patient's progressive sensory and motor impairment I am concerned for Guillain-Barr syndrome, particularly AMSAN.  Myasthenia gravis less likely but on the differential.  Spinal cord imaging unremarkable. 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 Went into a flutter.  Cardiology consulting.  Recommend keeping on anticoagulation for the risk of periprocedural and A. fib related thrombus formation and increased risk of cardiovascular/cerebrovascular thromboembolic event. Already received IVIG-do not feel spinal tap will provide much information.  Examination somewhat worse in terms of more bulbar involvement today.  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 - Continue IVIG for a total of 5 treatments  - AChR and MUSK antibodies pending  -Hold blood pressure medications, consider  continuing metoprolol at half home dose to avoid rebound tachycardia -Neurology will follow along  Discussed with patient and husband at bedside.  Anibal Henderson, AGACNP-BC [facility] [phone]   Attending Neurohospitalist Addendum Patient seen and examined with APP/Resident. Agree with the history and physical as documented above. Agree with the plan as documented, which I helped formulate. I have independently reviewed the chart, obtained history, review of systems and examined the patient.I have personally reviewed pertinent head/neck/spine imaging (CT/MRI). Please feel free to call with any questions.  -- Amie Portland, MD Neurologist [facility] Pager: [phone]

## 2021-02-07 NOTE — Progress Notes (Signed)
PROGRESS NOTE    Angela Morales  SPQ:[id number] DOB: Nov 13, 1945 DOA: 02/05/2021 PCP: Deland Pretty, MD   Chief Complaint  Patient presents with   Weakness    Brief Narrative: [phone] f w/ history of A. fib on anticoagulation recent ablation/3 after failed cardioversion 10/30/20, celiac disease degenerative disc disease presented with weakness, numbness in arms, legs worsening progressivelyx 3 days. Patient admitted due to concern for GBS.seen by neurology  Subjective: Seen and examined this morning.  She is resting comfortably on the bedside chair. Difficulty swallowing.  Reports weakness is progressing Alert awake oriented not in distress  Assessment & Plan:  Ascending weakness along with sensory symptoms progressively worse for 3 days PTA, suspecting GBS: Unable to perform LP due to Eliquis  but neuro feels it will not provide much information,.underwent MRI C-spine T-spine LS-spine no acute spinal cord finding.  Started on IVIG Day #2/5 with normal saline hydration.  Having difficulty swallowing, npo and LSP eval. Monitor NIF/FVC every 4 hours initial NIF -30 trend -22,   -20 -22 today am. Cont plan as per neurology  Atrial fibrillation with RVR, rapid response 8/12 6 PM: Rate controlled on Cardizem drip, on heparin drip.  Discussed with Dr. Quentin Ore from cardio- he would like to keep on drip for now especially with difficulty with swallowing.  She had recent cardioversion.  At risk of thromboembolic events  so continue heparin given recent cardioversion  Hyponatremia: Noted further drop in sodium level.  Check urine electrolytes, urine osmol, am cortisol, TSH.  Repeat BMP this afternoon. Pt on LR- we will hold. She is getting pre ivig 500 ml ns bolus., Recent Labs  Lab 02/05/21 2251 02/06/21 0831 02/07/21 0407  NA 129* 129* 123*    Prolonged QT interval monitor and avoid QT prolonging medication  Distended gallbladder on ultrasound without gallstones or pericholecystic fluid, no  evidence of cholecystitis and no biliary dilation.  Unclear etiology check lfts in today am labs  Diet Order             Diet Heart Room service appropriate? Yes; Fluid consistency: Thin  Diet effective now                    Patient's Body mass index is 25.01 kg/m. DVT prophylaxis: heparin gtt Code Status:   Code Status: Full Code  Family Communication: plan of care discussed with patient at bedside. Status is: Inpatient  Remains inpatient appropriate because:IV treatments appropriate due to intensity of illness or inability to take PO and Inpatient level of care appropriate due to severity of illness Dispo: The patient is from: Home              Anticipated d/c is to: TBD              Patient currently is not medically stable to d/c.   Difficult to place patient No  Unresulted Labs (From admission, onward)     Start     Ordered   02/08/21 0500  Heparin level (unfractionated)  Daily,   R      02/06/21 1850   02/08/21 0500  APTT  Daily,   R      02/06/21 1850   02/07/21 4562  Basic metabolic panel  Once-Timed,   TIMED       Question:  Specimen collection method  Answer:  Lab=Lab collect   02/07/21 0656   02/07/21 0710  Hepatic function panel  Add-on,   AD  Question:  Specimen collection method  Answer:  Lab=Lab collect   02/07/21 0709   02/07/21 0657  Na and K (sodium & potassium), rand urine  Once,   R        02/07/21 0656   02/07/21 0656  Osmolality, urine  Once,   R        02/07/21 0656   02/07/21 0656  TSH  Once,   R       Question:  Specimen collection method  Answer:  Lab=Lab collect   02/07/21 0656   02/07/21 0938  Basic metabolic panel  Daily,   R      02/06/21 0735   02/07/21 0500  CBC  Daily,   R      02/06/21 1850   02/06/21 0712  MuSK Antibodies  Once,   STAT        02/06/21 0711   02/06/21 0711  Acetylcholine Receptor Ab, All  Once,   STAT        02/06/21 0710           Medications reviewed:  Scheduled Meds:  Chlorhexidine Gluconate  Cloth  6 each Topical Daily   latanoprost  1 drop Left Eye QHS   sodium chloride flush  10-40 mL Intracatheter Q12H   timolol  1 drop Left Eye BID   Continuous Infusions:  diltiazem (CARDIZEM) infusion 10 mg/hr (02/07/21 0500)   heparin 900 Units/hr (02/07/21 0500)   Immune Globulin 10% 152 mL/hr at 02/06/21 2156   potassium chloride     sodium chloride Stopped (02/06/21 0833)   sodium chloride     Consultants:see note  Procedures:see note Antimicrobials: Anti-infectives (From admission, onward)    None      Culture/Microbiology    Component Value Date/Time   SDES URINE, CLEAN CATCH 12/01/2013 1339   SPECREQUEST NONE 12/01/2013 1339   CULT NO GROWTH Performed at [facility] 12/01/2013 1339   REPTSTATUS 12/02/2013 FINAL 12/01/2013 1339    Other culture-see note  Objective: Vitals: Today's Vitals   02/07/21 0400 02/07/21 0530 02/07/21 0732 02/07/21 1114  BP: 132/79 136/68 (!) 143/74 (!) 170/92  Pulse: 73 69 77 69  Resp: 18 20  18   Temp:      TempSrc:      SpO2: 94% 96% 96% 97%  Weight:  66.1 kg    PainSc:        Intake/Output Summary (Last 24 hours) at 02/07/2021 1120 Last data filed at 02/07/2021 0500 Gross per 24 hour  Intake 1065.11 ml  Output 900 ml  Net 165.11 ml   Filed Weights   02/07/21 0530  Weight: 66.1 kg   Weight change:   Intake/Output from previous day: 08/12 0701 - 08/13 0700 In: 2065.1 [P.O.:120; I.V.:875.1; IV Piggyback:1000] Out: 1611 [Urine:1611] Intake/Output this shift: No intake/output data recorded. Filed Weights   02/07/21 0530  Weight: 66.1 kg   Examination: General exam: AAO X3, generalized weakness not in distress, pleasant and comfortable. HEENT:Oral mucosa moist, Ear/Nose WNL grossly,dentition normal. Respiratory system: bilaterally diminished,no use of accessory muscle, non tender. Cardiovascular system: S1 & S2 +, sinus rhythm, no JVD. Gastrointestinal system: Abdomen soft, NT,ND, BS+. Nervous System:Alert,  awake, moving extremities Extremities: NO edema, distal peripheral pulses palpable.  Skin: No rashes,no icterus. MSK: Normal muscle bulk,tone, power Data Reviewed: I have personally reviewed following labs and imaging studies CBC: Recent Labs  Lab 02/05/21 2251 02/06/21 0831 02/07/21 0407  WBC 10.3 11.8* 12.4*  NEUTROABS 8.3*  --   --  HGB 15.5* 14.5 15.4*  HCT 46.1* 42.8 43.4  MCV 98.7 98.4 94.6  PLT 282 263 712   Basic Metabolic Panel: Recent Labs  Lab 02/05/21 2251 02/06/21 0831 02/07/21 0407  NA 129* 129* 123*  K 3.5 3.7 3.0*  CL 96* 96* 89*  CO2 24 23 23   GLUCOSE 138* 120* 119*  BUN 5* 6* 7*  CREATININE 0.46 0.41* 0.37*  CALCIUM 9.4 8.8* 8.9   GFR: Estimated Creatinine Clearance: 56.9 mL/min (A) (by C-G formula based on SCr of 0.37 mg/dL (L)). Liver Function Tests: No results for input(s): AST, ALT, ALKPHOS, BILITOT, PROT, ALBUMIN in the last 168 hours. No results for input(s): LIPASE, AMYLASE in the last 168 hours. No results for input(s): AMMONIA in the last 168 hours. Coagulation Profile: No results for input(s): INR, PROTIME in the last 168 hours. Cardiac Enzymes: No results for input(s): CKTOTAL, CKMB, CKMBINDEX, TROPONINI in the last 168 hours. BNP (last 3 results) No results for input(s): PROBNP in the last 8760 hours. HbA1C: No results for input(s): HGBA1C in the last 72 hours. CBG: Recent Labs  Lab 02/06/21 0002  GLUCAP 129*   Lipid Profile: No results for input(s): CHOL, HDL, LDLCALC, TRIG, CHOLHDL, LDLDIRECT in the last 72 hours. Thyroid Function Tests: No results for input(s): TSH, T4TOTAL, FREET4, T3FREE, THYROIDAB in the last 72 hours. Anemia Panel: No results for input(s): VITAMINB12, FOLATE, FERRITIN, TIBC, IRON, RETICCTPCT in the last 72 hours. Sepsis Labs: No results for input(s): PROCALCITON, LATICACIDVEN in the last 168 hours.  Recent Results (from the past 240 hour(s))  MRSA Next Gen by PCR, Nasal     Status: None   Collection  Time: 02/06/21  2:14 PM   Specimen: Nasopharyngeal Swab; Nasal Swab  Result Value Ref Range Status   MRSA by PCR Next Gen NOT DETECTED NOT DETECTED Final    Comment: (NOTE) The GeneXpert MRSA Assay (FDA approved for NASAL specimens only), is one component of a comprehensive MRSA colonization surveillance program. It is not intended to diagnose MRSA infection nor to guide or monitor treatment for MRSA infections. Test performance is not FDA approved in patients less than 77 years old. Performed at [facility], [place] [address], [place], [place] [address]   SARS CORONAVIRUS 2 (TAT 6-24 HRS) Nasopharyngeal Nasopharyngeal Swab     Status: None   Collection Time: 02/06/21  2:27 PM   Specimen: Nasopharyngeal Swab  Result Value Ref Range Status   SARS Coronavirus 2 NEGATIVE NEGATIVE Final    Comment: (NOTE) SARS-CoV-2 target nucleic acids are NOT DETECTED.  The SARS-CoV-2 RNA is generally detectable in upper and lower respiratory specimens during the acute phase of infection. Negative results do not preclude SARS-CoV-2 infection, do not rule out co-infections with other pathogens, and should not be used as the sole basis for treatment or other patient management decisions. Negative results must be combined with clinical observations, patient history, and epidemiological information. The expected result is Negative.  Fact Sheet for Patients: SugarRoll.be  Fact Sheet for Healthcare Providers: https://www.woods-mathews.com/  This test is not yet approved or cleared by the [place] FDA and  has been authorized for detection and/or diagnosis of SARS-CoV-2 by FDA under an Emergency Use Authorization (EUA). This EUA will remain  in effect (meaning this test can be used) for the duration of the COVID-19 declaration under Se ction 564(b)(1) of the Act, 21 U.S.C. section 360bbb-3(b)(1), unless the authorization is terminated or revoked  sooner.  Performed at [facility], [address] [place], [place] [address]      Radiology Studies: MR CERVICAL SPINE W WO CONTRAST  Result Date: 02/06/2021 CLINICAL DATA:  75 year old female with progressive weakness and numbness in the extremities over the past 3 days. Ascending weakness and now unable to walk. 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 Postoperative day 10 status post cardiac ablation for atrial fibrillation. EXAM: MRI TOTAL SPINE WITHOUT AND WITH CONTRAST TECHNIQUE: Multisequence MR imaging of the spine from the cervical spine to the sacrum was performed prior to and following IV contrast administration for evaluation of spinal metastatic disease. CONTRAST:  6.80m GADAVIST GADOBUTROL 1 MMOL/ML IV SOLN COMPARISON:  Lumbar MRI 09/08/2020. FINDINGS: MRI CERVICAL SPINE FINDINGS Alignment: Anterolisthesis of C4 on C5 measures 3 mm. Subtle anterolisthesis of C2 on C3, C7 on T1. Otherwise mild straightening of cervical lordosis. Vertebrae: Chronic degenerative marrow signal changes in the bilateral cervical facets, lower cervical spine endplates. No marrow edema or evidence of acute osseous abnormality. Cord: Normal. No cervical spinal cord signal abnormality despite up to mild degenerative spinal cord mass effect detailed below. Following contrast there is no convincing abnormal enhancement. No cervical spine dural thickening. Posterior Fossa, vertebral arteries, paraspinal tissues: Cervicomedullary junction is within normal limits. Negative visible posterior fossa. Negative for age other visible brain parenchyma. Preserved major vascular flow voids in the neck. Dominant appearing right vertebral artery. Prominent paravertebral venous system appears to be incidental, physiologic. Negative visible neck soft tissues and lung apices. Disc levels: C2-C3: Moderate to severe facet hypertrophy on the right but no marrow edema. Trace facet joint fluid. Mild disc bulging/pseudo disc. No spinal stenosis.  Severe right C3 foraminal stenosis. C3-C4: Mild to moderate facet hypertrophy. But no significant stenosis. C4-C5: Anterolisthesis with disc/pseudo disc bulging. Moderate to severe facet hypertrophy greater on the right. No spinal stenosis. Moderate right C5 foraminal stenosis. C5-C6: Disc space loss. Circumferential disc osteophyte complex eccentric to the left. Mild facet and ligament flavum hypertrophy greater on the left. Effaced ventral CSF space and borderline to mild spinal stenosis. Severe left C6 foraminal stenosis. C6-C7: Disc space loss with circumferential disc osteophyte complex eccentric to the left. Moderate left facet hypertrophy. Effaced ventral CSF space but no spinal stenosis. Moderate to severe left and mild right C7 foraminal stenosis. C7-T1: Subtle anterolisthesis. Negative disc. Mild to moderate facet hypertrophy. No stenosis. MRI THORACIC SPINE FINDINGS Segmentation: Hypoplastic ribs suspected at T12.  Otherwise normal. Alignment: Mildly exaggerated upper thoracic kyphosis. There is subtle anterolisthesis of T10 on T11. Vertebrae: No marrow edema or evidence of acute osseous abnormality. Visualized bone marrow signal is within normal limits. Preserved thoracic vertebral height. Cord: Normal. Capacious thoracic spinal canal at most levels. No abnormal intradural enhancement. No dural thickening. The conus medullaris appears normal at T12-L1. Paraspinal and other soft tissues: Negative. Disc levels: T1-T2: Mild to moderate facet hypertrophy but no significant stenosis. T2-T3: Mild facet hypertrophy greater on the left. Mild left T2 foraminal stenosis. T3-T4: Mild to moderate facet hypertrophy greater on the right. Mild right T3 foraminal stenosis. T4-T5: Mild to moderate facet hypertrophy greater on the right. No significant stenosis. T5-T6: Mild to moderate facet hypertrophy greater on the left. No stenosis. T6-T7: Mild to moderate left facet hypertrophy and moderate left T6 neural foraminal  stenosis. Small right T6 nerve root diverticulum. T7-T8: Negative; small right T7 nerve root diverticulum. T8-T9: Negative.  Small left T8 nerve root diverticulum. T9-T10: Negative. T10-T11: Mild anterolisthesis. Negative disc. Moderate facet hypertrophy greater on the left with trace facet joint fluid.  Mild ligament flavum hypertrophy. Moderate left T10 foraminal stenosis. No spinal stenosis. T11-T12: Negative. T12-L1: Negative. MRI LUMBAR SPINE FINDINGS Segmentation:  Normal, concordant with the above numbering. Alignment: Moderate levoconvex upper and dextroconvex lower lumbar scoliosis best demonstrated on coronal imaging included with the March MRI. Associated mild retrolisthesis at both L1-L2 and L2-L3 is stable. Mild associated anterolisthesis of L5 on S1 is stable. Vertebrae: Patchy and confluent degenerative appearing marrow edema eccentric to the left at the L1 through L4 levels is stable since March. Continued faint degenerative left L5-S1 endplate marrow edema also. No new or suspicious marrow lesion identified. Intact visible sacrum and SI joints. Conus medullaris: Extends to the T12-L1 level and appears normal. There is no thickening or enhancement of the cauda equina nerve roots, which appear stable on axial T2 imaging since March. No abnormal intradural enhancement or dural thickening. Paraspinal and other soft tissues: Partially visible distended urinary bladder, at least 350 mL estimated volume. Severe diverticulosis of large bowel in the pelvis. No pelvic inflammation is evident. Negative visible abdominal viscera. Disc levels: Advanced lumbar spine disc and endplate degeneration from L1-L2 through L5-S1 appears stable since March, notable for - mild to moderate right lateral recess stenosis and right foraminal stenosis at L1-L2 (right L1 and L2 nerve levels), - mild to moderate bilateral foraminal stenosis at L2 (bilateral L2 nerve levels), - moderate to severe left lateral recess stenosis at  L3-L4 with moderate spinal stenosis and moderate to severe right foraminal stenosis right L3 and left L4 nerve levels). -moderate to severe left foraminal stenosis at L4-L5 (left L4 nerve level). -moderate to severe left foraminal stenosis at L5-S1 (left L5 nerve level). IMPRESSION: 1. Normal spinal cord. The conus medullaris and cauda equina nerve roots appear stable since March and within normal limits. No imaging evidence of Guillain-Barre or spinal cord demyelination. 2. Advanced lumbar spine degeneration in the setting of moderate scoliosis is stable since a March MRI, including patchy lumbar vertebral body marrow edema, moderate multifactorial spinal stenosis at L3-L4, intermittent lateral recess and occasionally moderate to severe lumbar neural foraminal stenosis (left L4 and L5 nerve levels). 3. Generally mild for age thoracic spine degeneration, largely facet arthropathy. No thoracic spinal stenosis. Up to moderate associated left T6 and left T10 neural foraminal stenosis. 4. 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 5. Distended urinary bladder suspicious for Urinary Retention. Extensive diverticulosis of large bowel in the pelvis but no active inflammation. Electronically Signed   By: Genevie Ann M.D.   On: 02/06/2021 06:46   MR THORACIC SPINE W WO CONTRAST  Result Date: 02/06/2021 CLINICAL DATA:  75 year old female with progressive weakness and numbness in the extremities over the past 3 days. Ascending weakness and now unable to walk. 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 Postoperative day 10 status post cardiac ablation for atrial fibrillation. EXAM: MRI TOTAL SPINE WITHOUT AND WITH CONTRAST TECHNIQUE: Multisequence MR imaging of the spine from the cervical spine to the sacrum was performed prior to and following IV contrast administration for  evaluation of spinal metastatic disease. CONTRAST:  6.74m GADAVIST GADOBUTROL 1 MMOL/ML IV SOLN COMPARISON:  Lumbar MRI 09/08/2020. FINDINGS: MRI CERVICAL SPINE FINDINGS Alignment: Anterolisthesis of C4 on C5 measures 3 mm. Subtle anterolisthesis of C2 on C3, C7 on T1. Otherwise mild straightening of cervical lordosis. Vertebrae: Chronic degenerative marrow signal changes in the bilateral  cervical facets, lower cervical spine endplates. No marrow edema or evidence of acute osseous abnormality. Cord: Normal. No cervical spinal cord signal abnormality despite up to mild degenerative spinal cord mass effect detailed below. Following contrast there is no convincing abnormal enhancement. No cervical spine dural thickening. Posterior Fossa, vertebral arteries, paraspinal tissues: Cervicomedullary junction is within normal limits. Negative visible posterior fossa. Negative for age other visible brain parenchyma. Preserved major vascular flow voids in the neck. Dominant appearing right vertebral artery. Prominent paravertebral venous system appears to be incidental, physiologic. Negative visible neck soft tissues and lung apices. Disc levels: C2-C3: Moderate to severe facet hypertrophy on the right but no marrow edema. Trace facet joint fluid. Mild disc bulging/pseudo disc. No spinal stenosis. Severe right C3 foraminal stenosis. C3-C4: Mild to moderate facet hypertrophy. But no significant stenosis. C4-C5: Anterolisthesis with disc/pseudo disc bulging. Moderate to severe facet hypertrophy greater on the right. No spinal stenosis. Moderate right C5 foraminal stenosis. C5-C6: Disc space loss. Circumferential disc osteophyte complex eccentric to the left. Mild facet and ligament flavum hypertrophy greater on the left. Effaced ventral CSF space and borderline to mild spinal stenosis. Severe left C6 foraminal stenosis. C6-C7: Disc space loss with circumferential disc osteophyte complex eccentric to the left. Moderate left facet  hypertrophy. Effaced ventral CSF space but no spinal stenosis. Moderate to severe left and mild right C7 foraminal stenosis. C7-T1: Subtle anterolisthesis. Negative disc. Mild to moderate facet hypertrophy. No stenosis. MRI THORACIC SPINE FINDINGS Segmentation: Hypoplastic ribs suspected at T12.  Otherwise normal. Alignment: Mildly exaggerated upper thoracic kyphosis. There is subtle anterolisthesis of T10 on T11. Vertebrae: No marrow edema or evidence of acute osseous abnormality. Visualized bone marrow signal is within normal limits. Preserved thoracic vertebral height. Cord: Normal. Capacious thoracic spinal canal at most levels. No abnormal intradural enhancement. No dural thickening. The conus medullaris appears normal at T12-L1. Paraspinal and other soft tissues: Negative. Disc levels: T1-T2: Mild to moderate facet hypertrophy but no significant stenosis. T2-T3: Mild facet hypertrophy greater on the left. Mild left T2 foraminal stenosis. T3-T4: Mild to moderate facet hypertrophy greater on the right. Mild right T3 foraminal stenosis. T4-T5: Mild to moderate facet hypertrophy greater on the right. No significant stenosis. T5-T6: Mild to moderate facet hypertrophy greater on the left. No stenosis. T6-T7: Mild to moderate left facet hypertrophy and moderate left T6 neural foraminal stenosis. Small right T6 nerve root diverticulum. T7-T8: Negative; small right T7 nerve root diverticulum. T8-T9: Negative.  Small left T8 nerve root diverticulum. T9-T10: Negative. T10-T11: Mild anterolisthesis. Negative disc. Moderate facet hypertrophy greater on the left with trace facet joint fluid. Mild ligament flavum hypertrophy. Moderate left T10 foraminal stenosis. No spinal stenosis. T11-T12: Negative. T12-L1: Negative. MRI LUMBAR SPINE FINDINGS Segmentation:  Normal, concordant with the above numbering. Alignment: Moderate levoconvex upper and dextroconvex lower lumbar scoliosis best demonstrated on coronal imaging included  with the March MRI. Associated mild retrolisthesis at both L1-L2 and L2-L3 is stable. Mild associated anterolisthesis of L5 on S1 is stable. Vertebrae: Patchy and confluent degenerative appearing marrow edema eccentric to the left at the L1 through L4 levels is stable since March. Continued faint degenerative left L5-S1 endplate marrow edema also. No new or suspicious marrow lesion identified. Intact visible sacrum and SI joints. Conus medullaris: Extends to the T12-L1 level and appears normal. There is no thickening or enhancement of the cauda equina nerve roots, which appear stable on axial T2 imaging since March. No abnormal intradural enhancement or dural thickening. Paraspinal and other  soft tissues: Partially visible distended urinary bladder, at least 350 mL estimated volume. Severe diverticulosis of large bowel in the pelvis. No pelvic inflammation is evident. Negative visible abdominal viscera. Disc levels: Advanced lumbar spine disc and endplate degeneration from L1-L2 through L5-S1 appears stable since March, notable for - mild to moderate right lateral recess stenosis and right foraminal stenosis at L1-L2 (right L1 and L2 nerve levels), - mild to moderate bilateral foraminal stenosis at L2 (bilateral L2 nerve levels), - moderate to severe left lateral recess stenosis at L3-L4 with moderate spinal stenosis and moderate to severe right foraminal stenosis right L3 and left L4 nerve levels). -moderate to severe left foraminal stenosis at L4-L5 (left L4 nerve level). -moderate to severe left foraminal stenosis at L5-S1 (left L5 nerve level). IMPRESSION: 1. Normal spinal cord. The conus medullaris and cauda equina nerve roots appear stable since March and within normal limits. No imaging evidence of Guillain-Barre or spinal cord demyelination. 2. Advanced lumbar spine degeneration in the setting of moderate scoliosis is stable since a March MRI, including patchy lumbar vertebral body marrow edema, moderate  multifactorial spinal stenosis at L3-L4, intermittent lateral recess and occasionally moderate to severe lumbar neural foraminal stenosis (left L4 and L5 nerve levels). 3. Generally mild for age thoracic spine degeneration, largely facet arthropathy. No thoracic spinal stenosis. Up to moderate associated left T6 and left T10 neural foraminal stenosis. 4. 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 5. Distended urinary bladder suspicious for Urinary Retention. Extensive diverticulosis of large bowel in the pelvis but no active inflammation. Electronically Signed   By: Genevie Ann M.D.   On: 02/06/2021 06:46   MR Lumbar Spine W Wo Contrast  Result Date: 02/06/2021 CLINICAL DATA:  75 year old female with progressive weakness and numbness in the extremities over the past 3 days. Ascending weakness and now unable to walk. 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 Postoperative day 10 status post cardiac ablation for atrial fibrillation. EXAM: MRI TOTAL SPINE WITHOUT AND WITH CONTRAST TECHNIQUE: Multisequence MR imaging of the spine from the cervical spine to the sacrum was performed prior to and following IV contrast administration for evaluation of spinal metastatic disease. CONTRAST:  6.49m GADAVIST GADOBUTROL 1 MMOL/ML IV SOLN COMPARISON:  Lumbar MRI 09/08/2020. FINDINGS: MRI CERVICAL SPINE FINDINGS Alignment: Anterolisthesis of C4 on C5 measures 3 mm. Subtle anterolisthesis of C2 on C3, C7 on T1. Otherwise mild straightening of cervical lordosis. Vertebrae: Chronic degenerative marrow signal changes in the bilateral cervical facets, lower cervical spine endplates. No marrow edema or evidence of acute osseous abnormality. Cord: Normal. No cervical spinal cord signal abnormality despite up to mild degenerative spinal cord mass effect detailed below. Following  contrast there is no convincing abnormal enhancement. No cervical spine dural thickening. Posterior Fossa, vertebral arteries, paraspinal tissues: Cervicomedullary junction is within normal limits. Negative visible posterior fossa. Negative for age other visible brain parenchyma. Preserved major vascular flow voids in the neck. Dominant appearing right vertebral artery. Prominent paravertebral venous system appears to be incidental, physiologic. Negative visible neck soft tissues and lung apices. Disc levels: C2-C3: Moderate to severe facet hypertrophy on the right but no marrow edema. Trace facet joint fluid. Mild disc bulging/pseudo disc. No spinal stenosis. Severe right C3 foraminal stenosis. C3-C4: Mild to moderate facet hypertrophy. But no significant stenosis. C4-C5: Anterolisthesis with disc/pseudo disc bulging. Moderate to severe facet  hypertrophy greater on the right. No spinal stenosis. Moderate right C5 foraminal stenosis. C5-C6: Disc space loss. Circumferential disc osteophyte complex eccentric to the left. Mild facet and ligament flavum hypertrophy greater on the left. Effaced ventral CSF space and borderline to mild spinal stenosis. Severe left C6 foraminal stenosis. C6-C7: Disc space loss with circumferential disc osteophyte complex eccentric to the left. Moderate left facet hypertrophy. Effaced ventral CSF space but no spinal stenosis. Moderate to severe left and mild right C7 foraminal stenosis. C7-T1: Subtle anterolisthesis. Negative disc. Mild to moderate facet hypertrophy. No stenosis. MRI THORACIC SPINE FINDINGS Segmentation: Hypoplastic ribs suspected at T12.  Otherwise normal. Alignment: Mildly exaggerated upper thoracic kyphosis. There is subtle anterolisthesis of T10 on T11. Vertebrae: No marrow edema or evidence of acute osseous abnormality. Visualized bone marrow signal is within normal limits. Preserved thoracic vertebral height. Cord: Normal. Capacious thoracic spinal canal at most  levels. No abnormal intradural enhancement. No dural thickening. The conus medullaris appears normal at T12-L1. Paraspinal and other soft tissues: Negative. Disc levels: T1-T2: Mild to moderate facet hypertrophy but no significant stenosis. T2-T3: Mild facet hypertrophy greater on the left. Mild left T2 foraminal stenosis. T3-T4: Mild to moderate facet hypertrophy greater on the right. Mild right T3 foraminal stenosis. T4-T5: Mild to moderate facet hypertrophy greater on the right. No significant stenosis. T5-T6: Mild to moderate facet hypertrophy greater on the left. No stenosis. T6-T7: Mild to moderate left facet hypertrophy and moderate left T6 neural foraminal stenosis. Small right T6 nerve root diverticulum. T7-T8: Negative; small right T7 nerve root diverticulum. T8-T9: Negative.  Small left T8 nerve root diverticulum. T9-T10: Negative. T10-T11: Mild anterolisthesis. Negative disc. Moderate facet hypertrophy greater on the left with trace facet joint fluid. Mild ligament flavum hypertrophy. Moderate left T10 foraminal stenosis. No spinal stenosis. T11-T12: Negative. T12-L1: Negative. MRI LUMBAR SPINE FINDINGS Segmentation:  Normal, concordant with the above numbering. Alignment: Moderate levoconvex upper and dextroconvex lower lumbar scoliosis best demonstrated on coronal imaging included with the March MRI. Associated mild retrolisthesis at both L1-L2 and L2-L3 is stable. Mild associated anterolisthesis of L5 on S1 is stable. Vertebrae: Patchy and confluent degenerative appearing marrow edema eccentric to the left at the L1 through L4 levels is stable since March. Continued faint degenerative left L5-S1 endplate marrow edema also. No new or suspicious marrow lesion identified. Intact visible sacrum and SI joints. Conus medullaris: Extends to the T12-L1 level and appears normal. There is no thickening or enhancement of the cauda equina nerve roots, which appear stable on axial T2 imaging since March. No  abnormal intradural enhancement or dural thickening. Paraspinal and other soft tissues: Partially visible distended urinary bladder, at least 350 mL estimated volume. Severe diverticulosis of large bowel in the pelvis. No pelvic inflammation is evident. Negative visible abdominal viscera. Disc levels: Advanced lumbar spine disc and endplate degeneration from L1-L2 through L5-S1 appears stable since March, notable for - mild to moderate right lateral recess stenosis and right foraminal stenosis at L1-L2 (right L1 and L2 nerve levels), - mild to moderate bilateral foraminal stenosis at L2 (bilateral L2 nerve levels), - moderate to severe left lateral recess stenosis at L3-L4 with moderate spinal stenosis and moderate to severe right foraminal stenosis right L3 and left L4 nerve levels). -moderate to severe left foraminal stenosis at L4-L5 (left L4 nerve level). -moderate to severe left foraminal stenosis at L5-S1 (left L5 nerve level). IMPRESSION: 1. Normal spinal cord. The conus medullaris and cauda equina nerve roots appear stable since March  and within normal limits. No imaging evidence of Guillain-Barre or spinal cord demyelination. 2. Advanced lumbar spine degeneration in the setting of moderate scoliosis is stable since a March MRI, including patchy lumbar vertebral body marrow edema, moderate multifactorial spinal stenosis at L3-L4, intermittent lateral recess and occasionally moderate to severe lumbar neural foraminal stenosis (left L4 and L5 nerve levels). 3. Generally mild for age thoracic spine degeneration, largely facet arthropathy. No thoracic spinal stenosis. Up to moderate associated left T6 and left T10 neural foraminal stenosis. 4. 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 5. Distended urinary bladder suspicious for Urinary  Retention. Extensive diverticulosis of large bowel in the pelvis but no active inflammation. Electronically Signed   By: Genevie Ann M.D.   On: 02/06/2021 06:46   DG Chest Port 1 View  Result Date: 02/05/2021 CLINICAL DATA:  Chest pain EXAM: PORTABLE CHEST 1 VIEW COMPARISON:  03/08/2016 FINDINGS: Mild hyperinflation. Heart and mediastinal contours are within normal limits. No focal opacities or effusions. No acute bony abnormality. Aortic atherosclerosis. IMPRESSION: Hyperinflation. No active disease. Electronically Signed   By: Rolm Baptise M.D.   On: 02/05/2021 23:30   CT Angio Chest/Abd/Pel for Dissection W and/or Wo Contrast  Result Date: 02/06/2021 CLINICAL DATA:  Chest and back pain, dissection suspected EXAM: CT ANGIOGRAPHY CHEST, ABDOMEN AND PELVIS TECHNIQUE: Non-contrast CT of the chest was initially obtained. Multidetector CT imaging through the chest, abdomen and pelvis was performed using the standard protocol during bolus administration of intravenous contrast. Multiplanar reconstructed images and MIPs were obtained and reviewed to evaluate the vascular anatomy. CONTRAST:  110m OMNIPAQUE IOHEXOL 350 MG/ML SOLN COMPARISON:  Lumbar MRI 09/08/2020 FINDINGS: CTA CHEST FINDINGS Cardiovascular: Initial noncontrast CT imaging through the chest reveals an atherosclerotic thoracic aorta without hyperdense mural thickening or plaque displacement to suggest intramural hematoma. Postcontrast administration there is satisfactory arterial phase enhancement for assessment of the arterial vasculature. Insert air aortic root The aorta is normal caliber. No acute luminal abnormality of the imaged aorta. No periaortic stranding or hemorrhage. 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 Proximal great vessels are otherwise unremarkable and free of acute abnormality. Biatrial enlargement. Otherwise normal cardiac size. No pericardial effusion. Coronary atherosclerosis.  Central pulmonary arteries are normal caliber. No large central or lobar pulmonary arterial filling defects with more distal evaluation limited by this non tailored exam. No major venous abnormalities. Mediastinum/Nodes: No mediastinal fluid or gas. Normal thyroid gland and thoracic inlet. No acute abnormality of the trachea or thoracic esophagus. No worrisome mediastinal, hilar or axillary adenopathy. Lungs/Pleura: Airways patent. Atelectatic changes. No consolidation, features of edema, pneumothorax, or effusion. No suspicious pulmonary nodules or masses. Mild dependent atelectasis. Musculoskeletal: No acute osseous abnormality or suspicious osseous lesion. Multilevel degenerative changes are present in the imaged portions of the spine. Additional degenerative changes in the shoulders. No worrisome chest wall mass or lesion. Review of the MIP images confirms the above findings. CTA ABDOMEN AND PELVIS FINDINGS VASCULAR Aorta: Calcified noncalcified atheromatous plaque in the normal caliber aorta without aneurysm, dissection, vasculitis or significant stenosis. Celiac: Mild ostial plaque narrowing. Otherwise widely patent without evidence of aneurysm, dissection, vasculitis or significant stenosis. SMA: Mild ostial plaque narrowing. Otherwise patent without evidence of aneurysm, dissection, vasculitis or significant stenosis. Renals: Single renal arteries bilaterally. At most mild left ostial plaque  narrowing. Both renal arteries otherwise appear widely patent without aneurysm, dissection, vasculitis or features of fibromuscular dysplasia. IMA: Patent without evidence of aneurysm, dissection, vasculitis or significant stenosis. Inflow: Minimal atherosclerotic plaque throughout the inflow vasculature most pronounced in the common and internal segments. No flow-limiting stenosis or occlusion. No evidence of aneurysm, dissection or vasculitis. Included portion of the proximal outflow including the common, superficial  and deep femoral arteries appear grossly unremarkable without acute luminal abnormality, significant stenosis or occlusion. Veins: No major venous abnormality within limitations of this arterial phase exam. Review of the MIP images confirms the above findings. NON-VASCULAR Hepatobiliary: Diffuse hepatic hypoattenuation possibly related to contrast timing or fatty infiltration. Gallbladder is mildly distended. Question some very faint pericholecystic inflammation is noted particular towards the fundus. Pancreas: No pancreatic ductal dilatation or surrounding inflammatory changes. Spleen: Diminutive spleen, nonspecific.  No concerning splenic mass. Adrenals/Urinary Tract: Normal adrenals. Kidneys are normally located with symmetric enhancement. No suspicious renal lesion, urolithiasis or hydronephrosis. Bladder is unremarkable for the degree of distention. Stomach/Bowel: Distal esophagus and stomach are unremarkable. Large air and fluid-filled duodenal diverticulum without adjacent inflammation (7/178) measuring up to 3.8 cm in size. Normal duodenal sweep across the midline abdomen. No small bowel thickening or dilatation. Prior appendectomy. Extensive distal colonic diverticulosis and mild mural thickening of the affected segment of sigmoid colon albeit without focal inflammation to suggest an acute inflammatory process. No evidence of obstruction. Lymphatic: No suspicious or enlarged lymph nodes in the included lymphatic chains. Reproductive: Uterus is surgically absent. No concerning adnexal lesions. Other: No abdominopelvic free fluid or free gas. No bowel containing hernias. Musculoskeletal: No acute osseous abnormality or suspicious osseous lesion. Multilevel degenerative changes are present in the imaged portions of the spine. Retrolisthesis L2 on L3., anterolisthesis L5 on S1. Multilevel discogenic and facet degenerative changes appear grossly similar to comparison MR . Levocurvature of the lumbar spine with  pelvic tilt. Degenerative changes in the hips and pelvis. Review of the MIP images confirms the above findings. IMPRESSION: 1. No evidence of aortic dissection or other acute aortic syndrome. 2. Minimal atelectatic changes in the lungs. No other acute intrathoracic process. 3. Gallbladder distension with some faint pericholecystic stranding towards the fundus. Could reflect an early or developing acute cholecystitis. Correlate with serologies and further imaging with right upper quadrant ultrasound. 4. Large air-filled duodenal diverticulum without focal inflammation to suggest an inflammatory change at this time. 5. Segmental thickening of the sigmoid in a region of numerous colonic diverticula without adjacent stranding or phlegmon. Can reflect sequela of prior inflammation or a more chronic smoldering inflammation such as segmental colitis associated with diverticulosis. Correlate with clinical symptoms and consider outpatient direct visualization if not recently performed 6. Biatrial enlargement and coronary artery atherosclerosis. 7. Aortic Atherosclerosis (ICD10-I70.0). Mild ostial plaque narrowing of the celiac axis, SMA and left renal artery origin. Electronically Signed   By: Lovena Le M.D.   On: 02/06/2021 01:38   [place] EKG SITE RITE  Result Date: 02/06/2021 If Site Rite image not attached, placement could not be confirmed due to current cardiac rhythm.  US Abdomen Limited RUQ (LIVER/GB)  Result Date: 02/06/2021 CLINICAL DATA:  Abnormal CT with distended gallbladder and slight pericholecystic stranding. EXAM: ULTRASOUND ABDOMEN LIMITED RIGHT UPPER QUADRANT COMPARISON:  CT head Nov 06, 2020 FINDINGS: Gallbladder: Distended gallbladder. No gallbladder wall thickening or pericholecystic fluid. Negative for gallstones. Negative sonographic Murphy sign Common bile duct: Diameter: 6.3 mm. Liver: Mild increased echogenicity liver diffusely without focal liver lesion. Portal  vein is patent on color Doppler  imaging with normal direction of blood flow towards the liver. Other: None. IMPRESSION: Distended gallbladder without gallstones or pericholecystic fluid. No evidence of cholecystitis. No biliary dilatation. Electronically Signed   By: Franchot Gallo M.D.   On: 02/06/2021 07:47     LOS: 1 day   Antonieta Pert, MD [facility]  02/07/2021, 11:20 AM

## 2021-02-07 NOTE — Plan of Care (Signed)

## 2021-02-07 NOTE — Progress Notes (Signed)
1700  Patient tray of clear liquids arrived, spouse sitting at bedside assisting patient with eating jello.  After a few bites, patient began coughing a wet cough and sats dropped to 83% with a good pleth.  Bedside suction to attempt to remove what patient was coughing with very little return.  Patient with no real gag reflex.  [place] applied at 4 liters to try to bring sats to 90%. Successful to 87%. Page to Dr Maren Beach.  1709  call to [facility] RT to come deep suction. NRB obtained and STAT chest xray ordered.. Dr. Maren Beach returned page. Informed him of the above and orders were placed. [place] deep suctioned with little return. Patient again with no gag reflex during procedure. NRB applied after at 15 liters. Sats to 90%  1717 Call to [facility] Rapid response. At the bedside quickly.  Critical care arrived shortly there after to discuss Plan of Care to move patient to ICU.  1807  Sats 97% on NRB

## 2021-02-07 NOTE — Progress Notes (Signed)
NIF -24 CMH20 VC 1.1 L

## 2021-02-07 NOTE — Progress Notes (Signed)
Pharmacy Antibiotic Note  Angela Morales is a 75 y.o. female with concern of aspiration PNA to start zosyn -WBC= 12.4, afebrile -CrCl ~ 50   Plan: -Zosyn 3.375gm IV 8h -Will follow renal function and clinical progress   Weight: 66.1 kg (145 lb 11.6 oz)  Temp (24hrs), Avg:97.9 F (36.6 C), Min:97.6 F (36.4 C), Max:98.7 F (37.1 C)  Recent Labs  Lab 02/05/21 2251 02/06/21 0831 02/07/21 0407 02/07/21 1502  WBC 10.3 11.8* 12.4*  --   CREATININE 0.46 0.41* 0.37* 0.47    Estimated Creatinine Clearance: 56.9 mL/min (by C-G formula based on SCr of 0.47 mg/dL).    Allergies  Allergen Reactions   Gluten Meal Other (See Comments)    celiac disease   Cefdinir Rash      Thank you for allowing pharmacy to be a part of this patient's care.  Hildred Laser, PharmD Clinical Pharmacist **Pharmacist phone directory can now be found on [email or web address] ([email or web address]).  Listed under [place].

## 2021-02-08 ENCOUNTER — Inpatient Hospital Stay (HOSPITAL_COMMUNITY): Payer: Medicare Other

## 2021-02-08 DIAGNOSIS — J99 Respiratory disorders in diseases classified elsewhere: Secondary | ICD-10-CM

## 2021-02-08 DIAGNOSIS — G1229 Other motor neuron disease: Secondary | ICD-10-CM | POA: Diagnosis not present

## 2021-02-08 DIAGNOSIS — E871 Hypo-osmolality and hyponatremia: Secondary | ICD-10-CM

## 2021-02-08 DIAGNOSIS — J9601 Acute respiratory failure with hypoxia: Secondary | ICD-10-CM | POA: Diagnosis not present

## 2021-02-08 DIAGNOSIS — G709 Myoneural disorder, unspecified: Secondary | ICD-10-CM

## 2021-02-08 DIAGNOSIS — G610 Guillain-Barre syndrome: Secondary | ICD-10-CM | POA: Diagnosis not present

## 2021-02-08 LAB — BASIC METABOLIC PANEL
Anion gap: 11 (ref 5–15)
Anion gap: 8 (ref 5–15)
Anion gap: 8 (ref 5–15)
Anion gap: 9 (ref 5–15)
Anion gap: 5 (ref 5–15)
BUN: 11 mg/dL (ref 8–23)
BUN: 11 mg/dL (ref 8–23)
BUN: 10 mg/dL (ref 8–23)
BUN: 13 mg/dL (ref 8–23)
BUN: 15 mg/dL (ref 8–23)
CO2: 22 mmol/L (ref 22–32)
CO2: 24 mmol/L (ref 22–32)
CO2: 22 mmol/L (ref 22–32)
CO2: 21 mmol/L — ABNORMAL LOW (ref 22–32)
CO2: 23 mmol/L (ref 22–32)
Calcium: 8.4 mg/dL — ABNORMAL LOW (ref 8.9–10.3)
Calcium: 8.1 mg/dL — ABNORMAL LOW (ref 8.9–10.3)
Calcium: 8.1 mg/dL — ABNORMAL LOW (ref 8.9–10.3)
Calcium: 8.3 mg/dL — ABNORMAL LOW (ref 8.9–10.3)
Calcium: 7.5 mg/dL — ABNORMAL LOW (ref 8.9–10.3)
Chloride: 87 mmol/L — ABNORMAL LOW (ref 98–111)
Chloride: 90 mmol/L — ABNORMAL LOW (ref 98–111)
Chloride: 91 mmol/L — ABNORMAL LOW (ref 98–111)
Chloride: 92 mmol/L — ABNORMAL LOW (ref 98–111)
Chloride: 87 mmol/L — ABNORMAL LOW (ref 98–111)
Creatinine, Ser: 0.45 mg/dL (ref 0.44–1.00)
Creatinine, Ser: 0.43 mg/dL — ABNORMAL LOW (ref 0.44–1.00)
Creatinine, Ser: 0.47 mg/dL (ref 0.44–1.00)
Creatinine, Ser: 0.52 mg/dL (ref 0.44–1.00)
Creatinine, Ser: 0.5 mg/dL (ref 0.44–1.00)
GFR, Estimated: >60 mL/min (ref >60)
GFR, Estimated: >60 mL/min (ref >60)
GFR, Estimated: >60 mL/min (ref >60)
GFR, Estimated: >60 mL/min (ref >60)
GFR, Estimated: >60 mL/min (ref >60)
Glucose, Bld: 152 mg/dL — ABNORMAL HIGH (ref 70–99)
Glucose, Bld: 147 mg/dL — ABNORMAL HIGH (ref 70–99)
Glucose, Bld: 133 mg/dL — ABNORMAL HIGH (ref 70–99)
Glucose, Bld: 146 mg/dL — ABNORMAL HIGH (ref 70–99)
Glucose, Bld: 349 mg/dL — ABNORMAL HIGH (ref 70–99)
Potassium: 2.9 mmol/L — ABNORMAL LOW (ref 3.5–5.1)
Potassium: 3.6 mmol/L (ref 3.5–5.1)
Potassium: 4.1 mmol/L (ref 3.5–5.1)
Potassium: 3.4 mmol/L — ABNORMAL LOW (ref 3.5–5.1)
Potassium: 2.8 mmol/L — ABNORMAL LOW (ref 3.5–5.1)
Sodium: 120 mmol/L — ABNORMAL LOW (ref 135–145)
Sodium: 122 mmol/L — ABNORMAL LOW (ref 135–145)
Sodium: 121 mmol/L — ABNORMAL LOW (ref 135–145)
Sodium: 122 mmol/L — ABNORMAL LOW (ref 135–145)
Sodium: 115 mmol/L — CL (ref 135–145)

## 2021-02-08 LAB — CBC
HCT: 42.9 % (ref 36.0–46.0)
Hemoglobin: 15.5 g/dL — ABNORMAL HIGH (ref 12.0–15.0)
MCH: 33.4 pg (ref 26.0–34.0)
MCHC: 36.1 g/dL — ABNORMAL HIGH (ref 30.0–36.0)
MCV: 92.5 fL (ref 80.0–100.0)
Platelets: 227 10*3/uL (ref 150–400)
RBC: 4.64 MIL/uL (ref 3.87–5.11)
RDW: 11.8 % (ref 11.5–15.5)
WBC: 27.2 10*3/uL — ABNORMAL HIGH (ref 4.0–10.5)
nRBC: 0 % (ref 0.0–0.2)

## 2021-02-08 LAB — POCT I-STAT 7, (LYTES, BLD GAS, ICA,H+H)
Acid-base deficit: 2 mmol/L (ref 0.0–2.0)
Bicarbonate: 23.5 mmol/L (ref 20.0–28.0)
Calcium, Ion: 1.16 mmol/L (ref 1.15–1.40)
HCT: 48 % — ABNORMAL HIGH (ref 36.0–46.0)
Hemoglobin: 16.3 g/dL — ABNORMAL HIGH (ref 12.0–15.0)
O2 Saturation: 100 %
Patient temperature: 98.6
Potassium: 3.5 mmol/L (ref 3.5–5.1)
Sodium: 126 mmol/L — ABNORMAL LOW (ref 135–145)
TCO2: 25 mmol/L (ref 22–32)
pCO2 arterial: 41.9 mmHg (ref 32.0–48.0)
pH, Arterial: 7.356 (ref 7.350–7.450)
pO2, Arterial: 185 mmHg — ABNORMAL HIGH (ref 83.0–108.0)

## 2021-02-08 LAB — GLUCOSE, CAPILLARY
Glucose-Capillary: 126 mg/dL — ABNORMAL HIGH (ref 70–99)
Glucose-Capillary: 128 mg/dL — ABNORMAL HIGH (ref 70–99)
Glucose-Capillary: 156 mg/dL — ABNORMAL HIGH (ref 70–99)
Glucose-Capillary: 176 mg/dL — ABNORMAL HIGH (ref 70–99)

## 2021-02-08 LAB — HEPARIN LEVEL (UNFRACTIONATED): Heparin Unfractionated: 0.59 IU/mL (ref 0.30–0.70)

## 2021-02-08 LAB — NA AND K (SODIUM & POTASSIUM), RAND UR
Potassium Urine: 52 mmol/L
Sodium, Ur: 52 mmol/L

## 2021-02-08 LAB — OSMOLALITY, URINE: Osmolality, Ur: 446 mOsm/kg (ref 300–900)

## 2021-02-08 LAB — APTT: aPTT: 79 seconds — ABNORMAL HIGH (ref 24–36)

## 2021-02-08 LAB — PROCALCITONIN: Procalcitonin: 0.7 ng/mL

## 2021-02-08 MED ORDER — FENTANYL CITRATE (PF) 100 MCG/2ML IJ SOLN
INTRAMUSCULAR | Status: AC
  Administered 2021-02-08: 25 ug via INTRAVENOUS
  Filled 2021-02-08: qty 2

## 2021-02-08 MED ORDER — MIDAZOLAM HCL 2 MG/2ML IJ SOLN
INTRAMUSCULAR | Status: AC
  Filled 2021-02-08: qty 2

## 2021-02-08 MED ORDER — FENTANYL CITRATE (PF) 100 MCG/2ML IJ SOLN: 25.0000 ug | Freq: Once | INTRAMUSCULAR | Status: AC

## 2021-02-08 MED ORDER — PROSOURCE TF PO LIQD
45.0000 mL | Freq: Two times a day (BID) | ORAL | Status: DC
  Administered 2021-02-08 – 2021-03-04 (×49): 45 mL
  Filled 2021-02-08 (×48): qty 45

## 2021-02-08 MED ORDER — ROCURONIUM BROMIDE 10 MG/ML (PF) SYRINGE
PREFILLED_SYRINGE | INTRAVENOUS | Status: AC
  Filled 2021-02-08: qty 10

## 2021-02-08 MED ORDER — KETAMINE HCL 50 MG/5ML IJ SOSY
PREFILLED_SYRINGE | INTRAMUSCULAR | Status: AC
  Filled 2021-02-08: qty 5

## 2021-02-08 MED ORDER — SODIUM CHLORIDE 3 % IV BOLUS
50.0000 mL | Freq: Once | INTRAVENOUS | Status: AC
  Administered 2021-02-09: 50 mL via INTRAVENOUS
  Filled 2021-02-08: qty 50

## 2021-02-08 MED ORDER — ROCURONIUM BROMIDE 10 MG/ML (PF) SYRINGE
PREFILLED_SYRINGE | INTRAVENOUS | Status: AC
  Administered 2021-02-08: 100 mg via INTRAVENOUS
  Filled 2021-02-08: qty 10

## 2021-02-08 MED ORDER — DILTIAZEM HCL-DEXTROSE 125-5 MG/125ML-% IV SOLN (PREMIX): 5.0000 mg/h | INTRAVENOUS | Status: DC

## 2021-02-08 MED ORDER — MIDAZOLAM HCL 2 MG/2ML IJ SOLN: 4.0000 mg | Freq: Once | INTRAMUSCULAR | Status: DC

## 2021-02-08 MED ORDER — SODIUM CHLORIDE 0.9 % IV SOLN
5.0000 mL/h | INTRAVENOUS | Status: DC
  Administered 2021-02-08 – 2021-02-09 (×2): 10 mL/h via INTRAVENOUS
  Filled 2021-02-08 (×5): qty 100

## 2021-02-08 MED ORDER — SODIUM CHLORIDE 0.9 % IV BOLUS
500.0000 mL | Freq: Once | INTRAVENOUS | Status: AC
  Administered 2021-02-08: 500 mL via INTRAVENOUS

## 2021-02-08 MED ORDER — FENTANYL CITRATE (PF) 100 MCG/2ML IJ SOLN
100.0000 ug | Freq: Once | INTRAMUSCULAR | Status: DC
Start: 2021-02-08 — End: 2021-02-09

## 2021-02-08 MED ORDER — CHLORHEXIDINE GLUCONATE 0.12% ORAL RINSE (MEDLINE KIT)
15.0000 mL | Freq: Two times a day (BID) | OROMUCOSAL | Status: DC
  Administered 2021-02-08 – 2021-02-27 (×34): 15 mL via OROMUCOSAL

## 2021-02-08 MED ORDER — DILTIAZEM HCL-DEXTROSE 250-5 MG/250ML-% IV SOLN: 5.0000 mg/h | INTRAVENOUS | Status: DC

## 2021-02-08 MED ORDER — ETOMIDATE 2 MG/ML IV SOLN: 20.0000 mg | Freq: Once | INTRAVENOUS | Status: AC

## 2021-02-08 MED ORDER — ROCURONIUM BROMIDE 50 MG/5ML IV SOLN
100.0000 mg | Freq: Once | INTRAVENOUS | Status: AC
  Filled 2021-02-08: qty 10

## 2021-02-08 MED ORDER — SODIUM CHLORIDE 0.9 % IV SOLN
5.0000 mL/h | INTRAVENOUS | Status: DC
  Filled 2021-02-08 (×2): qty 125

## 2021-02-08 MED ORDER — DILTIAZEM HCL-SODIUM CHLORIDE 125-0.7 MG/125ML-% IV SOLN: 5.0000 mg/h | INTRAVENOUS | Status: DC

## 2021-02-08 MED ORDER — POLYETHYLENE GLYCOL 3350 17 G PO PACK
17.0000 g | PACK | Freq: Every day | ORAL | Status: DC
Start: 2021-02-08 — End: 2021-03-02
  Administered 2021-02-08 – 2021-02-17 (×5): 17 g
  Filled 2021-02-08 (×6): qty 1

## 2021-02-08 MED ORDER — POTASSIUM CHLORIDE 10 MEQ/100ML IV SOLN
10.0000 meq | INTRAVENOUS | Status: AC
  Administered 2021-02-08 (×6): 10 meq via INTRAVENOUS
  Filled 2021-02-08 (×6): qty 100

## 2021-02-08 MED ORDER — ORAL CARE MOUTH RINSE
15.0000 mL | OROMUCOSAL | Status: DC
  Administered 2021-02-08 – 2021-02-28 (×156): 15 mL via OROMUCOSAL

## 2021-02-08 MED ORDER — FENTANYL 2500MCG IN NS 250ML (10MCG/ML) PREMIX INFUSION
25.0000 ug/h | INTRAVENOUS | Status: DC
  Administered 2021-02-08: 25 ug/h via INTRAVENOUS
  Filled 2021-02-08: qty 250

## 2021-02-08 MED ORDER — NOREPINEPHRINE 4 MG/250ML-% IV SOLN
INTRAVENOUS | Status: AC
  Filled 2021-02-08: qty 250

## 2021-02-08 MED ORDER — DOCUSATE SODIUM 50 MG/5ML PO LIQD
100.0000 mg | Freq: Two times a day (BID) | ORAL | Status: DC
  Administered 2021-02-08 – 2021-03-01 (×15): 100 mg
  Filled 2021-02-08 (×19): qty 10

## 2021-02-08 MED ORDER — SODIUM CHLORIDE 0.9 % IV SOLN
3.3750 g | Freq: Three times a day (TID) | INTRAVENOUS | Status: AC
  Administered 2021-02-08 – 2021-02-11 (×11): 3.375 g via INTRAVENOUS
  Filled 2021-02-08 (×11): qty 15

## 2021-02-08 MED ORDER — EPINEPHRINE 1 MG/10ML IJ SOSY
PREFILLED_SYRINGE | INTRAMUSCULAR | Status: AC
  Filled 2021-02-08: qty 10

## 2021-02-08 MED ORDER — SODIUM CHLORIDE 0.9 % IV SOLN: INTRAVENOUS | Status: DC

## 2021-02-08 MED ORDER — ETOMIDATE 2 MG/ML IV SOLN
INTRAVENOUS | Status: AC
  Administered 2021-02-08: 20 mg via INTRAVENOUS
  Filled 2021-02-08: qty 20

## 2021-02-08 MED ORDER — POTASSIUM CHLORIDE 20 MEQ PO PACK
40.0000 meq | PACK | Freq: Once | ORAL | Status: AC
  Administered 2021-02-09: 40 meq
  Filled 2021-02-08: qty 2

## 2021-02-08 MED ORDER — FENTANYL BOLUS VIA INFUSION
25.0000 ug | INTRAVENOUS | Status: DC | PRN
  Filled 2021-02-08: qty 100

## 2021-02-08 MED ORDER — PANTOPRAZOLE SODIUM 40 MG IV SOLR
40.0000 mg | Freq: Every day | INTRAVENOUS | Status: DC
  Administered 2021-02-08: 40 mg via INTRAVENOUS
  Filled 2021-02-08: qty 40

## 2021-02-08 MED ORDER — VITAL HIGH PROTEIN PO LIQD
1000.0000 mL | ORAL | Status: DC
  Administered 2021-02-08: 1000 mL

## 2021-02-08 MED ORDER — POLYVINYL ALCOHOL 1.4 % OP SOLN
1.0000 [drp] | OPHTHALMIC | Status: DC | PRN
  Filled 2021-02-08: qty 15

## 2021-02-08 NOTE — Procedures (Signed)
Intubation Procedure Note  Angela Morales  [id number]  08-20-1945  Date:02/08/21  Time:9:37 AM   Provider Performing:Haelie Clapp P Carlis Abbott    Procedure: Intubation (04599)  Indication(s) Respiratory Failure  Consent Risks of the procedure as well as the alternatives and risks of each were explained to the patient and/or caregiver.  Consent for the procedure was obtained and is signed in the bedside chart   Anesthesia Etomidate and Rocuronium   Time Out Verified patient identification, verified procedure, site/side was marked, verified correct patient position, special equipment/implants available, medications/allergies/relevant history reviewed, required imaging and test results available.   Sterile Technique Usual hand hygeine, masks, and gloves were used   Procedure Description Patient positioned in bed supine.  Sedation given as noted above.  Patient was intubated with endotracheal tube using Glidescope S3.  View was Grade 1 full glottis .  Number of attempts was 1.  7.5 ETT placed. Colorimetric CO2 detector was consistent with tracheal placement.   Complications/Tolerance None; patient tolerated the procedure well. Chest X-ray is ordered to verify placement.   EBL Minimal   Specimen(s) None  Julian Hy, DO 02/08/21 9:37 AM [facility]

## 2021-02-08 NOTE — Progress Notes (Signed)
eLink Physician-Brief Progress Note Patient Name: Angela Morales DOB: 01-04-1946 MRN: [id number]   Date of Service  02/08/2021  HPI/Events of Note  Potassium 2.9  eICU Interventions  60 IV replacement ordered over 6 hrs     Intervention Category Major Interventions: Electrolyte abnormality - evaluation and management  Mauri [place], P 02/08/2021, 6:50 AM

## 2021-02-08 NOTE — Progress Notes (Addendum)
[place] Progress Note Patient Name: Angela Morales DOB: 1945-12-01 MRN: [id number]   Date of Service  02/08/2021  HPI/Events of Note  Labs reviewed.  Na trending down, now at 115 <-- 122 while on NS.  K 2.8, crea 0.50.   eICU Interventions  Discontinue NS.  Start on hypertonic saline with 50cc bolus and then recheck serum Na in 2 hours.     Intervention Category Intermediate Interventions: Electrolyte abnormality - evaluation and management  Elsie Lincoln 02/08/2021, 11:46 PM  3:38 AM Serum Na back to 122 <-- 115. K 3.9.  Crea 0.48.  Plan> Restart hypertonic saline at 0.76m/kg/hr x 2 hours.  Repeat serum Na.  6:14 AM Na improved from 122 --> 124 after 2 hours of 30cc/hr of hypertonic saline.  Phos <1.0, K 3.7, Mg 1.5.  Crea 0.57  Plan> Replete with Kphos 394ml, 2g MgSO4.  Continue to monitor serum Na.

## 2021-02-08 NOTE — Progress Notes (Signed)
NAME:  Angela Morales, MRN:  [id number], DOB:  [birth year], LOS: 2 ADMISSION DATE:  02/05/2021, CONSULTATION DATE:  02/07/21  REFERRING MD:  Dr Lupita Leash, CHIEF COMPLAINT:  tachypnea, hypoxemia   History of Present Illness:  75 year old woman with a history of atrial fibrillation (ablated 8/3), celiac disease, DDD who was admitted on 8/11 with bilateral upper and lower extremity weakness and paresthesias beginning about 8/8.  Developed some ascending weakness that resulted in inability to ambulate.  No other prodrome.  No clear precipitants although she was started on gabapentin about 1 month ago.  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  LP deferred initially as she was on Eliquis last dose 8/11.  IVIG started 8/12.  She has had progressive bilateral upper extremity weakness, some bulbar dysfunction difficulty swallowing, globus sensation with perception of swallowing and voice weakness.  FVC has been consistently greater than 1.0 L, NIF <-25.  She had an episode of desaturation, increased work of breathing and choking when eating Jell-O and pudding evening of 8/13.  Pertinent  Medical History   Past Medical History:  Diagnosis Date   Abnormal uterine bleeding    on HRT   Allergy    seasonal   Arthritis    Atrial fibrillation (HCC)    Celiac disease    DDD (degenerative disc disease)    Dysrhythmia    Glaucoma    Microscopic colitis    Osteoarthritis of both knees    PONV (postoperative nausea and vomiting)    SVT (supraventricular tachycardia) ([place])      Significant Hospital Events: Including procedures, antibiotic start and stop dates in addition to other pertinent events   IVIg started 8/12 CT CAP >> no dissection, bibasilar atelectasis, distended gallbladder with some pericholecystic stranding, segmental colitis question related to diverticular disease RUQ ultrasound 8/12 >> distended gallbladder without stones or pericholecystic fluid no evidence  cholecystitis MR cervical, thoracic, lumbar spine 8/12 > normal spinal cord no imaging evidence of demyelination or Guillain-Barr.  Advanced lumbar DJD, cervical DJD  Interim History / Subjective:  Feels like her voice is about the same and feels less SOB compared to yesterday.  Objective   Blood pressure (!) 154/81, pulse 90, temperature (!) 96.8 F (36 C), temperature source Axillary, resp. rate (!) 27, weight 66.1 kg, last menstrual period 04/20/2005, SpO2 94 %.        Intake/Output Summary (Last 24 hours) at 02/08/2021 0755 Last data filed at 02/07/2021 1942 Gross per 24 hour  Intake --  Output 950 ml  Net -950 ml    Filed Weights   02/07/21 0530  Weight: 66.1 kg    Examination: General: ill appearing woman lying in bed in NAD HENT: [place]/AT, eyes anicteric.  Lungs: Diminshed basilar breath sounds, no rhonchi. Wet sounding voice, moderate cough, able to clear her throat. No accessory muscle use. Cardiovascular: RRR, S1S2 Abdomen: soft, NT Extremities: no peripheral edema or cyanosis Neuro: able to move from hips, not able to move feet at all. Very weak and uncoordinated hands, but able to bend from elbow against gravity. Not able to lift head off pillow. Answering questions appropriately.  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  VC and NIF both downtrending.  Also concerning is her bulbar dysfunction and difficulty managing secretions. -Move to the ICU  now for closer monitoring -Strict n.p.o. -Continue to follow NIF, vital capacity closely.  Every 2 hours -NTS PRN -Electively intubate today for progressive weakness based on her NIF and vital capacity. D/w neurology; will intubate after they are able to examine her unless acuity changes rapidly. -Note  started on Zosyn 8/13 for possible aspiration pneumonia.  Will obtain respiratory culture and check PCT. 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 Likely infusions in D5 are contributing, but can also be seen commonly with GBS. -change fluids out of D5 if possible -Ns infusion -repeat BMP after  K+ runs  Hypokalemia -repleted; recheck this afternoon  Hyperglycemia -SSI PRN -goal BG 140-180  Leukocytosis, potentially due to IVIG vs pneumonia -con't antibiotics monitor  Dysphagia due to GBS -strict NPO -will start TF once intubated  History of celiac disease -will discuss with RD to ensure TF are gluten free  Afib, recent ablation on 01/28/21 -diltiazem infusion -heparin infusion, hold eliquis  Glaucoma -con't timolol drops   Best Practice (right click and "Reselect all SmartList Selections" daily)   Diet/type: NPO DVT prophylaxis: systemic heparin GI prophylaxis: N/A Lines: Central line and yes and it is still needed- PICC Foley:  N/A Code Status:  full code Last date of multidisciplinary goals of care discussion [Goals discussed w patient 8/13, would want full scope of care]  Labs   CBC: Recent Labs  Lab 02/05/21 2251 02/06/21 0831 02/07/21 0407 02/08/21 0553  WBC 10.3 11.8* 12.4* 27.2*  NEUTROABS 8.3*  --   --   --   HGB 15.5* 14.5 15.4* 15.5*  HCT 46.1* 42.8 43.4 42.9  MCV 98.7 98.4 94.6 92.5  PLT 282 263 258 227     Basic Metabolic Panel: Recent Labs  Lab 02/05/21 2251 02/06/21 0831 02/07/21 0407 02/07/21 1502  02/08/21 0553  NA 129* 129* 123* 121* 120*  K 3.5 3.7 3.0* 3.6 2.9*  CL 96* 96* 89* 90* 87*  CO2 24 23 23 22 22   GLUCOSE 138* 120* 119* 122* 152*  BUN 5* 6* 7* 9 11  CREATININE 0.46 0.41* 0.37* 0.47 0.45  CALCIUM 9.4 8.8* 8.9 8.4* 8.4*    GFR: Estimated Creatinine Clearance: 56.9 mL/min (by C-G formula based on SCr of 0.45 mg/dL). Recent Labs  Lab 02/05/21 2251 02/06/21 0831 02/07/21 0407 02/08/21 0553  WBC 10.3 11.8* 12.4* 27.2*     This patient is critically ill with multiple organ system failure which requires frequent high complexity decision making, assessment, support, evaluation, and titration of therapies. This was completed through the application of advanced monitoring technologies and extensive interpretation of multiple databases. During this encounter critical care time was devoted to patient care services described in this note for 35 minutes.  Julian Hy, DO 02/08/21 7:55 AM [facility]

## 2021-02-08 NOTE — Progress Notes (Signed)
ANTICOAGULATION CONSULT NOTE - Initial Consult  Pharmacy Consult for heparin Indication: atrial fibrillation  Allergies  Allergen Reactions   Gluten Meal Other (See Comments)    celiac disease   Cefdinir Rash    Patient Measurements: Weight: 66.1 kg (145 lb 11.6 oz) Heparin Dosing Weight: 64kg  Vital Signs: Temp: 96.8 F (36 C) (08/14 0012) Temp Source: Axillary (08/14 0012) BP: 154/81 (08/14 0600) Pulse Rate: 90 (08/14 0600)  Labs: Recent Labs    02/05/21 2251 02/06/21 0121 02/06/21 0831 02/07/21 0407 02/07/21 0508 02/07/21 1502 02/08/21 0553 02/08/21 0554  HGB 15.5*  --  14.5 15.4*  --   --  15.5*  --   HCT 46.1*  --  42.8 43.4  --   --  42.9  --   PLT 282  --  263 258  --   --  227  --   APTT  --   --   --   --  88*  --  79*  --   HEPARINUNFRC  --   --   --  1.00*  --   --   --  0.59  CREATININE 0.46  --  0.41* 0.37*  --  0.47 0.45  --   TROPONINIHS 25* 27*  --   --   --   --   --   --      Estimated Creatinine Clearance: 56.9 mL/min (by C-G formula based on SCr of 0.45 mg/dL).   Medical History: Past Medical History:  Diagnosis Date   Abnormal uterine bleeding    on HRT   Allergy    seasonal   Arthritis    Atrial fibrillation (HCC)    Celiac disease    DDD (degenerative disc disease)    Dysrhythmia    Glaucoma    Microscopic colitis    Osteoarthritis of both knees    PONV (postoperative nausea and vomiting)    SVT (supraventricular tachycardia) (HCC)     Medications:  Facility-Administered Medications Prior to Admission  Medication Dose Route Frequency Provider Last Rate Last Admin   0.9 %  sodium chloride infusion  500 mL Intravenous Continuous [name], Estill Cotta III, MD       Medications Prior to Admission  Medication Sig Dispense Refill Last Dose   apixaban (ELIQUIS) 5 MG TABS tablet TAKE 1 TABLET BY MOUTH TWICE A DAY 180 tablet 1 02/05/2021 at 0830   gabapentin (NEURONTIN) 300 MG capsule Take 300 mg by mouth at bedtime.   02/04/2021    ibuprofen (ADVIL,MOTRIN) 200 MG tablet Take 400 mg by mouth every 6 (six) hours as needed for moderate pain.   Past Week   loratadine (CLARITIN) 10 MG tablet Take 10 mg by mouth daily as needed for allergies.   PRN   metoprolol succinate (TOPROL-XL) 25 MG 24 hr tablet TAKE 1 TABLET BY MOUTH EVERY DAY 90 tablet 3 02/05/2021 at 0830   Polyethyl Glycol-Propyl Glycol (SYSTANE) 0.4-0.3 % SOLN Place 1-2 drops into both eyes 3 (three) times daily as needed (dry eyes).   Past Week   psyllium (METAMUCIL) 58.6 % packet Take 1 packet by mouth in the morning.   02/05/2021   timolol (TIMOPTIC) 0.5 % ophthalmic solution Place 1 drop into the left eye 2 (two) times daily.   02/05/2021   ZIOPTAN 0.0015 % SOLN Place 1 drop into the left eye at bedtime.   02/04/2021    Assessment: 75 year old female with history of afib on apixaban prior to  admit. 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 Last dose of apixaban appears to be prior to admission on 8/11 at 0830. Patient back in afib this evening, will bridge with heparin while apixaban washes out. CBC appears normal.   aPTT therapeutic at 79.  HL therapeutic at 0.59.  Now that aPTT and HL correlate, will move to dosing based off of HL.  Goal of Therapy:  Heparin level 0.3-0.7 units/ml aPTT 66-102 seconds Monitor platelets by anticoagulation protocol: Yes   Plan:  Continue heparin at 900 units/hr Check HL daily Monitor H&H and platelets   Cathrine Muster, PharmD PGY2 Cardiology Pharmacy Resident Phone: [phone] 02/08/2021  6:46 AM  Please check [email or web address] for unit-specific pharmacy phone numbers.

## 2021-02-08 NOTE — Progress Notes (Signed)
NIF -18 CMH20 VC .68L

## 2021-02-08 NOTE — Progress Notes (Signed)
Patient is refusing to participate with RT for breathing trials stating "its making this worse" patient continues to state that she is at home and asks staff to leave her room.  Attempted to reorient patient and patient refuses to answer questions stating "I know where I am and what's going on"

## 2021-02-08 NOTE — Progress Notes (Signed)
SLP Cancellation Note  Patient Details Name: YOANA STAIB MRN: [id number] DOB: 1946/05/25   Cancelled treatment:       Reason Eval/Treat Not Completed: Medical issues which prohibited therapy.  Pt is intubated at this time per chart review.  SLP will f/u as appropriate.    Elvia Collum Jaylnn Ullery 02/08/2021, 11:11 AM

## 2021-02-08 NOTE — Progress Notes (Signed)
Retracted ETT now 23CM at the lip

## 2021-02-08 NOTE — Progress Notes (Signed)
Patient beginning to show signs of delirium. Patient is oriented to self, confused of place, time and situation. Patient states she feels like she is at home. Patient also made statements stating "you're trying to make me sick" calling for "officer" and asking about her "husband and the secretary". Patient is suspicious of staff in the room with her.   Patient is reoriented with simple directions, and showing work badge and assuring she is safe and in the hospital.   Patient has not slept yet this shift, encouraged patient to rest, and provided frequent repositioning for patient comfort.

## 2021-02-08 NOTE — Progress Notes (Addendum)
Neurology Progress Note  Brief HPI: 75 y.o. 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  Subjective: Reports of agitation, confusion, and possible delirium overnight Significantly worsening weakness noted on examination this morning Moved to the ICU overnight for closer monitoring due to choking and progressively worsening respiratory studies NIF this morning -18 with VC 0.68L  Discussed with Dr. Carlis Abbott the need for elective intubation 2/2 progressive respiratory decline with possible hypoxic and hypercarbic component to agitation and delirium   Exam: Vitals:   02/08/21 0600 02/08/21 0700  BP: (!) 154/81   Pulse: 90   Resp: (!) 27   Temp:  (!) 96.9 F (36.1 C)  SpO2: 94%    Gen: Critically ill appearing patient laying in ICU bed, appears weak Resp: Use of accessory muscles apparent for respirations, SpO2 94% on cardiac monitor, appears fatigued with respirations, weak cough noted Abd: soft, non-tender, non-distended  Mental Status:  Awake, alert, and oriented x3.  Fluency is limited due to increased use of accessory muscles with respirations.  Speech is hypophonic. She is able to speak in whispers and with one word answers on assessment this morning.  No neglect is noted.  Cranial Nerves: PERRL, EOMI without ptosis, diplopia, or nystagmus, visual fields are full, facial sensation is intact and symmetric to light touch, face is symmetric resting and with movement, hearing is intact to voice, palate elevates minimally but symmetrically, patient is hypophonic, tongue protrudes midline without fasciculations.  Motor:  Significantly progressive weakness noted throughout each extremity.  Bilateral upper extremities 3/5 strength with brief antigravity movement without ability to sustain antigravity movement.  Bilateral lower extremities appear weaker than BUE with 2/5 strength hip flexors bilaterally, 2/5 knee flexion and extension. Minimal attempt at antigravity movement throughout bilateral lower extremities. Sensory: Severely diminished in bilateral lower extremities, also has a glove type sensory diminished pattern in the upper extremities DTR: Areflexic throughout Gait: Deferred  Pertinent Labs: CBC    Component Value Date/Time   WBC 27.2 (H) 02/08/2021 0553   RBC 4.64 02/08/2021 0553   HGB 15.5 (H) 02/08/2021 0553   HGB 16.0 (H) 01/22/2021 1138   HCT 42.9 02/08/2021 0553   HCT 48.2 (H) 01/22/2021 1138   PLT 227 02/08/2021 0553   PLT 210 01/22/2021 1138   MCV 92.5 02/08/2021 0553   MCV 101 (H) 01/22/2021 1138   MCH 33.4 02/08/2021 0553   MCHC 36.1 (H) 02/08/2021 0553   RDW 11.8 02/08/2021 0553   RDW 12.0 01/22/2021 1138   LYMPHSABS 1.5 02/05/2021 2251   MONOABS 0.5 02/05/2021 2251   EOSABS 0.0 02/05/2021 2251   BASOSABS 0.0 02/05/2021 2251   CMP     Component Value Date/Time   NA 120 (L) 02/08/2021 0553   NA 137 01/22/2021 1138   K 2.9 (L) 02/08/2021 0553   CL 87 (L) 02/08/2021 0553   CO2 22 02/08/2021 0553   GLUCOSE 152 (H) 02/08/2021 0553   BUN 11 02/08/2021 0553   BUN 8 01/22/2021 1138   CREATININE 0.45 02/08/2021 0553   CALCIUM 8.4 (L) 02/08/2021 0553   PROT 6.8 02/07/2021 0508   ALBUMIN 2.9 (L) 02/07/2021 0508   AST  23 02/07/2021 0508   ALT 12 02/07/2021 0508   ALKPHOS 49 02/07/2021 0508   BILITOT 1.0 02/07/2021 0508   GFRNONAA >60 02/08/2021 0553   GFRAA >60 03/23/2016 0315   AChR and MUSK antibodies pending  Imaging Reviewed:  MRI Cervical, thoracic and lumbar spine w/ and w/o contrast 02/06/2021: 1. Normal spinal cord. The conus medullaris and cauda equina nerve  roots appear stable since March and within normal limits. No imaging evidence of Guillain-Barre or spinal cord demyelination. 2. Advanced lumbar spine degeneration in the setting of moderate scoliosis is stable since a March MRI, including patchy lumbar vertebral body marrow edema, moderate multifactorial spinal stenosis at L3-L4, intermittent lateral recess and occasionally moderate to severe lumbar neural foraminal stenosis (left L4 and L5 nerve levels). 3. Generally mild for age thoracic spine degeneration, largely facet arthropathy. No thoracic spinal stenosis. Up to moderate associated left T6 and left T10 neural foraminal stenosis. 4. 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 5. Distended urinary bladder suspicious for Urinary Retention. Extensive diverticulosis of large bowel in the pelvis but no active inflammation.  Assessment: 75 y.o. female who presented for evaluation of progressive sensory deficits throughout and progressive ascending weakness for 4 days affecting her ambulation.    Given the patient's rapid progressive sensory and motor impairment I am concerned for Guillain-Barr syndrome, or variants such as AMSAN.  Myasthenia gravis less likely but on the differential.  Spinal cord imaging unremarkable Started on IVIG for presumptive Guillain-Barr syndrome. Went into a flutter.  Cardiology consulting.  Recommend keeping on anticoagulation for the risk of periprocedural and A. fib related thrombus formation and increased risk of cardiovascular/cerebrovascular thromboembolic event. Already received IVIG-do not feel spinal tap will provide much information.  Examination is significantly worse in terms of more bulbar involvement today with decreased mobility of the neck with flexion and extension, increased work of  breathing with use of accessory muscles, progressive weakness throughout each extremity. Patient is areflexic on examination this morning.  She also has hyponatremia which is worsening-seen in a minority of patients with Guillain-Barr syndrome.  Impression:  Ascending weakness along with sensory symptoms-Guillain-Barr syndrome versus a Guillain-Barr variant Differentials include MG, lower on the differential Acute hypoxic and hypercapnic respiratory failure . Hyponatremia  Recommendations: - Agree with elective intubation, discussed with patient and DO at bedside  - Obtain MRI brain once intubated and able to tolerate imaging to r/o brainstem pathology due to sudden onset of bulbar symptoms. - Obtain ABG - Continue 5 days of IVIG to be complete 02/10/2021 - Will consider PLEX treatment if no response to IVIG once complete - Hold blood pressure medications, consider continuing metoprolol at half home dose to avoid rebound tachycardia - Management of hyponatremia per PCCM  Anibal Henderson, AGACNP-BC [facility] [phone]  Attending Neurohospitalist Addendum Patient seen and examined with APP/Resident. Agree with the history and physical as documented above. Agree with the plan as documented, which I helped formulate. I have independently reviewed the chart, obtained history, review of systems and examined the patient.I have personally reviewed pertinent head/neck/spine imaging (CT/MRI). Please feel free to call with any questions.  -- Amie Portland, MD Neurologist [facility] Pager: [phone]  CRITICAL CARE ATTESTATION Performed by: Amie Portland, MD Total critical care time: 39 minutes Critical care time was exclusive of separately billable procedures and treating  other patients and/or supervising APPs/Residents/Students Critical care was necessary to treat or prevent imminent or life-threatening deterioration due to GBS  This patient is critically  ill and at significant risk for neurological worsening and/or death and care requires constant monitoring. Critical care was time spent personally by me on the following activities: development of treatment plan with patient and/or surrogate as well as nursing, discussions with consultants, evaluation of patient's response to treatment, examination of patient, obtaining history from patient or surrogate, ordering and performing treatments and interventions, ordering and review of laboratory studies, ordering and review of radiographic studies, pulse oximetry, re-evaluation of patient's condition, participation in multidisciplinary rounds and medical decision making of high complexity in the care of this patient.

## 2021-02-08 NOTE — Progress Notes (Signed)
Inpatient Rehab Admissions Coordinator Note:   Per PT recommendations, pt was screened for CIR candidacy by Gayland Curry, MS, CCC-SLP.  At this time we are not recommending an inpatient rehab consult. Note pt is intubated.  Will follow medical workup and progress with therapies from a distance. Please contact me with questions.     Gayland Curry, [place], CCC-SLP Admissions Coordinator [phone] 02/08/21 11:46 AM

## 2021-02-09 ENCOUNTER — Inpatient Hospital Stay (HOSPITAL_COMMUNITY): Payer: Medicare Other

## 2021-02-09 DIAGNOSIS — G610 Guillain-Barre syndrome: Secondary | ICD-10-CM | POA: Diagnosis not present

## 2021-02-09 LAB — CBC
HCT: 33.4 % — ABNORMAL LOW (ref 36.0–46.0)
Hemoglobin: 12.1 g/dL (ref 12.0–15.0)
MCH: 34.5 pg — ABNORMAL HIGH (ref 26.0–34.0)
MCHC: 36.2 g/dL — ABNORMAL HIGH (ref 30.0–36.0)
MCV: 95.2 fL (ref 80.0–100.0)
Platelets: 166 10*3/uL (ref 150–400)
RBC: 3.51 MIL/uL — ABNORMAL LOW (ref 3.87–5.11)
RDW: 12.2 % (ref 11.5–15.5)
WBC: 15.9 10*3/uL — ABNORMAL HIGH (ref 4.0–10.5)
nRBC: 0 % (ref 0.0–0.2)

## 2021-02-09 LAB — BASIC METABOLIC PANEL
Anion gap: 5 (ref 5–15)
Anion gap: 4 — ABNORMAL LOW (ref 5–15)
Anion gap: 6 (ref 5–15)
Anion gap: 5 (ref 5–15)
Anion gap: 5 (ref 5–15)
Anion gap: 3 — ABNORMAL LOW (ref 5–15)
BUN: 15 mg/dL (ref 8–23)
BUN: 19 mg/dL (ref 8–23)
BUN: 18 mg/dL (ref 8–23)
BUN: 18 mg/dL (ref 8–23)
BUN: 18 mg/dL (ref 8–23)
BUN: 14 mg/dL (ref 8–23)
CO2: 22 mmol/L (ref 22–32)
CO2: 24 mmol/L (ref 22–32)
CO2: 22 mmol/L (ref 22–32)
CO2: 24 mmol/L (ref 22–32)
CO2: 25 mmol/L (ref 22–32)
CO2: 25 mmol/L (ref 22–32)
Calcium: 8.1 mg/dL — ABNORMAL LOW (ref 8.9–10.3)
Calcium: 7.9 mg/dL — ABNORMAL LOW (ref 8.9–10.3)
Calcium: 7 mg/dL — ABNORMAL LOW (ref 8.9–10.3)
Calcium: 7.7 mg/dL — ABNORMAL LOW (ref 8.9–10.3)
Calcium: 7.6 mg/dL — ABNORMAL LOW (ref 8.9–10.3)
Calcium: 7.6 mg/dL — ABNORMAL LOW (ref 8.9–10.3)
Chloride: 95 mmol/L — ABNORMAL LOW (ref 98–111)
Chloride: 96 mmol/L — ABNORMAL LOW (ref 98–111)
Chloride: 87 mmol/L — ABNORMAL LOW (ref 98–111)
Chloride: 95 mmol/L — ABNORMAL LOW (ref 98–111)
Chloride: 95 mmol/L — ABNORMAL LOW (ref 98–111)
Chloride: 92 mmol/L — ABNORMAL LOW (ref 98–111)
Creatinine, Ser: 0.48 mg/dL (ref 0.44–1.00)
Creatinine, Ser: 0.57 mg/dL (ref 0.44–1.00)
Creatinine, Ser: 0.6 mg/dL (ref 0.44–1.00)
Creatinine, Ser: 0.5 mg/dL (ref 0.44–1.00)
Creatinine, Ser: 0.43 mg/dL — ABNORMAL LOW (ref 0.44–1.00)
Creatinine, Ser: 0.38 mg/dL — ABNORMAL LOW (ref 0.44–1.00)
GFR, Estimated: >60 mL/min (ref >60)
GFR, Estimated: >60 mL/min (ref >60)
GFR, Estimated: >60 mL/min (ref >60)
GFR, Estimated: >60 mL/min (ref >60)
GFR, Estimated: >60 mL/min (ref >60)
GFR, Estimated: >60 mL/min (ref >60)
Glucose, Bld: 119 mg/dL — ABNORMAL HIGH (ref 70–99)
Glucose, Bld: 130 mg/dL — ABNORMAL HIGH (ref 70–99)
Glucose, Bld: 472 mg/dL — ABNORMAL HIGH (ref 70–99)
Glucose, Bld: 124 mg/dL — ABNORMAL HIGH (ref 70–99)
Glucose, Bld: 121 mg/dL — ABNORMAL HIGH (ref 70–99)
Glucose, Bld: 116 mg/dL — ABNORMAL HIGH (ref 70–99)
Potassium: 3.9 mmol/L (ref 3.5–5.1)
Potassium: 3.7 mmol/L (ref 3.5–5.1)
Potassium: >7.5 mmol/L (ref 3.5–5.1)
Potassium: 3.8 mmol/L (ref 3.5–5.1)
Potassium: 3.7 mmol/L (ref 3.5–5.1)
Potassium: 3.3 mmol/L — ABNORMAL LOW (ref 3.5–5.1)
Sodium: 122 mmol/L — ABNORMAL LOW (ref 135–145)
Sodium: 124 mmol/L — ABNORMAL LOW (ref 135–145)
Sodium: 115 mmol/L — CL (ref 135–145)
Sodium: 124 mmol/L — ABNORMAL LOW (ref 135–145)
Sodium: 125 mmol/L — ABNORMAL LOW (ref 135–145)
Sodium: 120 mmol/L — ABNORMAL LOW (ref 135–145)

## 2021-02-09 LAB — GLUCOSE, CAPILLARY
Glucose-Capillary: 116 mg/dL — ABNORMAL HIGH (ref 70–99)
Glucose-Capillary: 119 mg/dL — ABNORMAL HIGH (ref 70–99)
Glucose-Capillary: 121 mg/dL — ABNORMAL HIGH (ref 70–99)
Glucose-Capillary: 131 mg/dL — ABNORMAL HIGH (ref 70–99)
Glucose-Capillary: 132 mg/dL — ABNORMAL HIGH (ref 70–99)
Glucose-Capillary: 145 mg/dL — ABNORMAL HIGH (ref 70–99)

## 2021-02-09 LAB — MAGNESIUM: Magnesium: 1.5 mg/dL — ABNORMAL LOW (ref 1.7–2.4)

## 2021-02-09 LAB — PHOSPHORUS
Phosphorus: <1 mg/dL — CL (ref 2.5–4.6)
Phosphorus: 1.3 mg/dL — ABNORMAL LOW (ref 2.5–4.6)

## 2021-02-09 LAB — OSMOLALITY: Osmolality: 271 mOsm/kg — ABNORMAL LOW (ref 275–295)

## 2021-02-09 LAB — CORTISOL: Cortisol, Plasma: 28.7 ug/dL

## 2021-02-09 LAB — PROCALCITONIN: Procalcitonin: 1.09 ng/mL

## 2021-02-09 LAB — HEPARIN LEVEL (UNFRACTIONATED): Heparin Unfractionated: 0.58 IU/mL (ref 0.30–0.70)

## 2021-02-09 LAB — OSMOLALITY, URINE: Osmolality, Ur: 698 mOsm/kg (ref 300–900)

## 2021-02-09 MED ORDER — FENTANYL CITRATE (PF) 100 MCG/2ML IJ SOLN
25.0000 ug | INTRAMUSCULAR | Status: DC | PRN
  Administered 2021-02-11: 25 ug via INTRAVENOUS
  Filled 2021-02-09 (×3): qty 2

## 2021-02-09 MED ORDER — MAGNESIUM SULFATE 2 GM/50ML IV SOLN
2.0000 g | Freq: Once | INTRAVENOUS | Status: AC
  Administered 2021-02-09: 2 g via INTRAVENOUS
  Filled 2021-02-09: qty 50

## 2021-02-09 MED ORDER — POLYVINYL ALCOHOL 1.4 % OP SOLN
1.0000 [drp] | OPHTHALMIC | Status: DC | PRN
  Administered 2021-02-10 – 2021-02-20 (×2): 1 [drp] via OPHTHALMIC
  Filled 2021-02-09: qty 15

## 2021-02-09 MED ORDER — POTASSIUM PHOSPHATES 15 MMOLE/5ML IV SOLN
30.0000 mmol | Freq: Once | INTRAVENOUS | Status: AC
  Administered 2021-02-09: 30 mmol via INTRAVENOUS
  Filled 2021-02-09: qty 10

## 2021-02-09 MED ORDER — GADOBUTROL 1 MMOL/ML IV SOLN
6.0000 mL | Freq: Once | INTRAVENOUS | Status: AC | PRN
  Administered 2021-02-09: 6 mL via INTRAVENOUS

## 2021-02-09 MED ORDER — PANTOPRAZOLE SODIUM 40 MG PO PACK
40.0000 mg | PACK | Freq: Every day | ORAL | Status: DC
  Administered 2021-02-09 – 2021-03-02 (×22): 40 mg
  Filled 2021-02-09 (×24): qty 20

## 2021-02-09 MED ORDER — MIDAZOLAM HCL 2 MG/2ML IJ SOLN: 1.0000 mg | INTRAMUSCULAR | Status: DC | PRN

## 2021-02-09 MED ORDER — VITAL 1.5 CAL PO LIQD
1000.0000 mL | ORAL | Status: DC
  Administered 2021-02-09 – 2021-02-10 (×2): 1000 mL

## 2021-02-09 MED ORDER — LABETALOL HCL 5 MG/ML IV SOLN
10.0000 mg | INTRAVENOUS | Status: DC | PRN
  Administered 2021-02-09 – 2021-02-12 (×5): 10 mg via INTRAVENOUS
  Filled 2021-02-09 (×4): qty 4

## 2021-02-09 MED ORDER — METOPROLOL TARTRATE 25 MG/10 ML ORAL SUSPENSION
12.5000 mg | Freq: Two times a day (BID) | ORAL | Status: DC
  Administered 2021-02-09 (×2): 12.5 mg
  Filled 2021-02-09 (×3): qty 5

## 2021-02-09 MED ORDER — PROPOFOL 1000 MG/100ML IV EMUL
INTRAVENOUS | Status: AC
  Filled 2021-02-09: qty 100

## 2021-02-09 MED ORDER — FENTANYL CITRATE (PF) 100 MCG/2ML IJ SOLN
25.0000 ug | INTRAMUSCULAR | Status: DC | PRN
  Administered 2021-02-09: 100 ug via INTRAVENOUS
  Administered 2021-02-09: 50 ug via INTRAVENOUS
  Filled 2021-02-09: qty 2

## 2021-02-09 MED ORDER — SODIUM CHLORIDE 3 % IV SOLN
30.0000 mL/h | INTRAVENOUS | Status: DC
  Administered 2021-02-09: 30 mL/h via INTRAVENOUS
  Filled 2021-02-09: qty 500

## 2021-02-09 MED ORDER — GABAPENTIN 250 MG/5ML PO SOLN
300.0000 mg | Freq: Every day | ORAL | Status: DC
  Administered 2021-02-09 – 2021-02-14 (×6): 300 mg
  Filled 2021-02-09 (×6): qty 6

## 2021-02-09 MED ORDER — POTASSIUM PHOSPHATES 15 MMOLE/5ML IV SOLN
45.0000 mmol | Freq: Once | INTRAVENOUS | Status: AC
  Administered 2021-02-10: 45 mmol via INTRAVENOUS
  Filled 2021-02-09: qty 15

## 2021-02-09 MED ORDER — PROPOFOL 1000 MG/100ML IV EMUL
5.0000 ug/kg/min | INTRAVENOUS | Status: DC
Start: 2021-02-09 — End: 2021-02-10
  Administered 2021-02-09: 5 ug/kg/min via INTRAVENOUS
  Administered 2021-02-10: 20 ug/kg/min via INTRAVENOUS
  Filled 2021-02-09: qty 100

## 2021-02-09 MED ORDER — MIDAZOLAM HCL 2 MG/2ML IJ SOLN
1.0000 mg | INTRAMUSCULAR | Status: DC | PRN
Start: 2021-02-09 — End: 2021-02-12
  Administered 2021-02-09: 1 mg via INTRAVENOUS
  Filled 2021-02-09: qty 2

## 2021-02-09 NOTE — Progress Notes (Signed)
Pt transported to from [place] [facility] to MRI 2 and back on the ventilator. RT, RN and transport accompanied pt. VSS throughout. RT will continue to monitor and be available as needed.

## 2021-02-09 NOTE — Progress Notes (Signed)
OT Evaluation:  Pta patient independent, admitted for below and presenting with problem list including generalized weakness, impaired balance, decreased activity tolerance, impaired sensation and coordination and impaired cognition.  Pt able to follow simple 1 step commands with increased time, nodding head yes/no but overall difficult to assess due to intubation.  Patient total assist +2 for bed mobility and self care at this time. Further visual and cognitive assessment recommended.  Will follow acutely to optimize independence and return to PLOF, recommend CIR post acutely.    02/09/21 1400  OT Visit Information  Last OT Received On 02/09/21  Assistance Needed +2  PT/OT/SLP Co-Evaluation/Treatment Yes  Reason for Co-Treatment Complexity of the patient's impairments (multi-system involvement);For patient/therapist safety;To address functional/ADL transfers  OT goals addressed during session ADL's and self-care  History of Present Illness Pt is a 75 y.o. 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 Pt also with afib with RVR. Eliquis on hold for possible need of LP; awaiting neurology consult. Intubated 8/14 due to Acute respiratory failure with hypoxia w/ aspiration PNA and bibasilar atx.[facility] MRI negative 8/15. PMH includes afib (recent ablation 01/28/21), celiac disease, DDD, gluacoma, L TKA (2017).  Precautions  Precautions Fall  Restrictions  Weight Bearing Restrictions No  Home Living  Family/patient expects to be discharged to: Private residence  Living Arrangements Spouse/significant other;Children  Available Help at Discharge Family;Available PRN/intermittently  Type of Home House  Home Access Stairs to enter  Entrance Stairs-Number of Steps 3-4  Entrance Stairs-Rails Left  Home Layout Multi-level;Able to live on main level with bedroom/bathroom;Full bath on main level  Bathroom Shower/Tub Tub/shower unit;Walk-in shower   Home Equipment None  Additional Comments Lives with husband and son; husband works-- per PT eval  Prior Function  Level of Independence Independent  Comments Independent without DME; retired Press photographer; drives; enjoys babysitting grandkids (71 and 49 y.o.), enjoys time with cat and dog, watching Occupational psychologist Other (comment) (intubated)  Pain Assessment  Pain Assessment Faces  Faces Pain Scale 4  Pain Location back  Pain Descriptors / Indicators Grimacing;Discomfort;Guarding  Pain Intervention(s) Limited activity within patient's tolerance;Monitored during session;Repositioned  Cognition  Arousal/Alertness Awake/alert  Behavior During Therapy WFL for tasks assessed/performed  Overall Cognitive Status Difficult to assess  General Comments pt able to nod yes/no, give thumbs up and follow simple commands  Difficult to assess due to Intubated  Upper Extremity Assessment  Upper Extremity Assessment RUE deficits/detail;LUE deficits/detail  RUE Deficits / Details able to follow simple commands to test movement but <3/5 throughout UE, unable to raise at shoulder but able to control elbow/hand/wrist with increased time  RUE Sensation decreased light touch;decreased proprioception  RUE Coordination decreased fine motor;decreased gross motor  LUE Deficits / Details able to follow simple commands to test movement but <3/5 throughout UE, unable to raise at shoulder but able to control elbow/hand/wrist with increased time (extension >flexion)  LUE Sensation decreased light touch;decreased proprioception  LUE Coordination decreased fine motor;decreased gross motor  Lower Extremity Assessment  Lower Extremity Assessment Defer to PT evaluation  ADL  Overall ADL's  Needs assistance/impaired  Functional mobility during ADLs Total assistance;+2 for physical assistance;+2 for safety/equipment  General ADL Comments total assist  Vision- Assessment  Additional  Comments further assessment required  Bed Mobility  Overal bed mobility Needs Assistance  Bed Mobility Rolling;Sidelying to Sit;Sit to Sidelying  Rolling Total assist;+2 for physical assistance;+2 for safety/equipment  Sidelying to sit Total assist;+2  for physical assistance;+2 for safety/equipment  Sit to sidelying Total assist;+2 for safety/equipment;+2 for physical assistance  Transfers  General transfer comment unable  Balance  Overall balance assessment Needs assistance  Sitting balance-Leahy Scale Poor  Sitting balance - Comments sitting EOB with total assist +2 for 5 minutes, relies on external support  General Comments  General comments (skin integrity, edema, etc.) VSS on PRVC at 50% Fio2, Peep 5  OT - End of Session  Activity Tolerance Patient limited by lethargy;Patient limited by pain  Patient left in bed;with call bell/phone within reach;with bed alarm set;with nursing/sitter in room  Nurse Communication Mobility status  OT Assessment  OT Recommendation/Assessment Patient needs continued OT Services  OT Visit Diagnosis Other abnormalities of gait and mobility (R26.89);Muscle weakness (generalized) (M62.81);Pain;Other symptoms and signs involving cognitive function;Other symptoms and signs involving the nervous system (R29.898)  Pain - part of body  (back)  OT Problem List Decreased strength;Decreased activity tolerance;Impaired balance (sitting and/or standing);Decreased coordination;Decreased range of motion;Decreased cognition;Decreased safety awareness;Decreased knowledge of use of DME or AE;Decreased knowledge of precautions;Cardiopulmonary status limiting activity;Impaired sensation;Impaired tone;Impaired UE functional use;Pain  OT Plan  OT Frequency (ACUTE ONLY) Min 2X/week  OT Treatment/Interventions (ACUTE ONLY) Self-care/ADL training;Therapeutic exercise;DME and/or AE instruction;Therapeutic activities;Cognitive remediation/compensation;Patient/family  education;Balance training;Energy conservation;Neuromuscular education  AM-PAC OT "6 Clicks" Daily Activity Outcome Measure (Version 2)  Help from another person eating meals? 1  Help from another person taking care of personal grooming? 1  Help from another person toileting, which includes using toliet, bedpan, or urinal? 1  Help from another person bathing (including washing, rinsing, drying)? 1  Help from another person to put on and taking off regular upper body clothing? 1  Help from another person to put on and taking off regular lower body clothing? 1  6 Click Score 6  OT Recommendation  Recommendations for Other Services Rehab consult  Follow Up Recommendations CIR;Supervision/Assistance - 24 hour  OT Equipment Other (comment) (TBD at next venue of care)  Individuals Consulted  Consulted and Agree with Results and Recommendations Patient  Acute Rehab OT Goals  Patient Stated Goal none stated today  OT Goal Formulation Patient unable to participate in goal setting  Time For Goal Achievement 02/23/21  Potential to Achieve Goals Good  OT Time Calculation  OT Start Time (ACUTE ONLY) 1320  OT Stop Time (ACUTE ONLY) 1343  OT Time Calculation (min) 23 min  OT General Charges  $OT Visit 1 Visit  OT Evaluation  $OT Eval High Complexity 1 High  Written Expression  Dominant Hand Left   Jolaine Artist, OT Acute Rehabilitation Services Pager [phone] Office [phone]

## 2021-02-09 NOTE — Progress Notes (Signed)
Inpatient Diabetes Program Recommendations  AACE/ADA: New Consensus Statement on Inpatient Glycemic Control   Target Ranges:  Prepandial:   less than 140 mg/dL      Peak postprandial:   less than 180 mg/dL (1-2 hours)      Critically ill patients:  140 - 180 mg/dL   Results for DAMA, HEDGEPETH (MRN [id number]) as of 02/09/2021 12:34  Ref. Range 02/08/2021 12:57 02/08/2021 16:27 02/08/2021 22:37 02/08/2021 23:49 02/09/2021 04:18 02/09/2021 08:12  Glucose-Capillary Latest Ref Range: 70 - 99 mg/dL 176 (H) 156 (H) 128 (H) 126 (H) 131 (H) 145 (H)    Review of Glycemic Control  Diabetes history: No Outpatient Diabetes medications: NA Current orders for Inpatient glycemic control: CBGs  Inpatient Diabetes Program Recommendations:    Insulin: Please consider ordering Novolog 0-9 units Q4H.  Thanks, Barnie Alderman, RN, MSN, CDE Diabetes Coordinator Inpatient Diabetes Program [phone] (Team Pager from 8am to 5pm)

## 2021-02-09 NOTE — Progress Notes (Signed)
Subjective: She has developed dysconjugate gaze.   Exam: Vitals:   02/09/21 1330 02/09/21 1400  BP:    Pulse: (!) 120 85  Resp: (!) 24 (!) 24  Temp:    SpO2: 90% 92%   Gen: In bed, NAD Resp: non-labored breathing, no acute distress Abd: soft, nt  Neuro: MS: awake, answers questions with nods/shakes.  CN:she has impaired depression, adduction of the left eye, preserved abduction. She has minor assymetry of the pupils, with the left slightly larger than the right, but both are reactive.  Motor: She has severe weakness of the LE with 2/5 strength at the knee, length dependant UE weakness as well with little dorsiflexion at the wrist, some grip.  Sensory:she endorses diminished sensation in bilateral legs.  SEL:TRVUYE.   Pertinent Labs: Na 124  Impression: 75 yo F with acute ascending paralysis/sensory change with areflexia consistent with GBS. With cranial nerve involvement, this is most consistent with AIDP, but other GBS variants are also possible. Certainly with the severity of her symptoms, she likely has a long road ahead of her in terms of recovery. There has not been any strong evidence to support retreatment after an initial IVIG course, and more recently two studies have suggested no benefit of additional IVIG in non-responders. She will likely need   SIADH is a common finding with GBS. No significant autonomic instability thus far.   Recommendations: 1) Continue IVIG, today is day 4/5 2) Na management per CCM 3) will continue to follow.      Roland Rack, MD [facility] [phone]  If 7pm- 7am, please page neurology on call as listed in [place].

## 2021-02-09 NOTE — Procedures (Signed)
Cortrak  Person Inserting Tube:  Osmin Welz, RD Tube Type:  Cortrak - 43 inches Tube Size:  10 Tube Location:  Right nare Initial Placement:  Stomach Secured by: Bridle Technique Used to Measure Tube Placement:  Marking at nare/corner of mouth Cortrak Secured At:  65 cm Procedure Comments:  Cortrak Tube Team Note:  Consult received to place a Cortrak feeding tube.   X-ray is required, abdominal x-ray has been ordered by the Cortrak team. Please confirm tube placement before using the Cortrak tube.   If the tube becomes dislodged please keep the tube and contact the Cortrak team at [email or web address] (password [email or web address]) for replacement.  If after hours and replacement cannot be delayed, place a NG tube and confirm placement with an abdominal x-ray.    Mariana Single MS, RD, LDN, CNSC Clinical Nutrition Pager listed in [place]

## 2021-02-09 NOTE — Progress Notes (Signed)
Phos 1.3, K+ 3.3 Replaced per protocol

## 2021-02-09 NOTE — Progress Notes (Signed)
ANTICOAGULATION CONSULT NOTE  Pharmacy Consult for heparin Indication: atrial fibrillation  Allergies  Allergen Reactions   Gluten Meal Other (See Comments)    celiac disease   Cefdinir Rash    Patient Measurements: Weight: 66.1 kg (145 lb 11.6 oz) Heparin Dosing Weight: 64kg  Vital Signs: Temp: 97.8 F (36.6 C) (08/15 0815) Temp Source: Axillary (08/15 0815) BP: 106/54 (08/15 0800) Pulse Rate: 79 (08/15 0800)  Labs: Recent Labs    02/07/21 0407 02/07/21 0508 02/07/21 1502 02/08/21 0553 02/08/21 0554 02/08/21 0900 02/08/21 1038 02/08/21 1439 02/08/21 2221 02/09/21 0136 02/09/21 0520  HGB 15.4*  --   --  15.5*  --   --  16.3*  --   --   --  12.1  HCT 43.4  --   --  42.9  --   --  48.0*  --   --   --  33.4*  PLT 258  --   --  227  --   --   --   --   --   --  166  APTT  --  88*  --  79*  --   --   --   --   --   --   --   HEPARINUNFRC 1.00*  --   --   --  0.59  --   --   --   --   --  0.58  CREATININE 0.37*  --    < > 0.45  --    < >  --    < > 0.50 0.48 0.57   < > = values in this interval not displayed.     Estimated Creatinine Clearance: 56.9 mL/min (by C-G formula based on SCr of 0.57 mg/dL).   Medical History: Past Medical History:  Diagnosis Date   Abnormal uterine bleeding    on HRT   Allergy    seasonal   Arthritis    Atrial fibrillation (HCC)    Celiac disease    DDD (degenerative disc disease)    Dysrhythmia    Glaucoma    Microscopic colitis    Osteoarthritis of both knees    PONV (postoperative nausea and vomiting)    SVT (supraventricular tachycardia) (HCC)     Medications:  Facility-Administered Medications Prior to Admission  Medication Dose Route Frequency Provider Last Rate Last Admin   0.9 %  sodium chloride infusion  500 mL Intravenous Continuous [name], Estill Cotta III, MD       Medications Prior to Admission  Medication Sig Dispense Refill Last Dose   apixaban (ELIQUIS) 5 MG TABS tablet TAKE 1 TABLET BY MOUTH TWICE A DAY 180  tablet 1 02/05/2021 at 0830   gabapentin (NEURONTIN) 300 MG capsule Take 300 mg by mouth at bedtime.   02/04/2021   ibuprofen (ADVIL,MOTRIN) 200 MG tablet Take 400 mg by mouth every 6 (six) hours as needed for moderate pain.   Past Week   loratadine (CLARITIN) 10 MG tablet Take 10 mg by mouth daily as needed for allergies.   PRN   metoprolol succinate (TOPROL-XL) 25 MG 24 hr tablet TAKE 1 TABLET BY MOUTH EVERY DAY 90 tablet 3 02/05/2021 at 0830   Polyethyl Glycol-Propyl Glycol (SYSTANE) 0.4-0.3 % SOLN Place 1-2 drops into both eyes 3 (three) times daily as needed (dry eyes).   Past Week   psyllium (METAMUCIL) 58.6 % packet Take 1 packet by mouth in the morning.   02/05/2021   timolol (TIMOPTIC) 0.5 % ophthalmic  solution Place 1 drop into the left eye 2 (two) times daily.   02/05/2021   ZIOPTAN 0.0015 % SOLN Place 1 drop into the left eye at bedtime.   02/04/2021    Assessment: 75 year old female with history of afib on apixaban prior to admit. 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 Last dose of apixaban appears to be prior to admission on 8/11 at 0830. Patient back in afib this evening, will bridge with heparin while apixaban washes out.   Heparin level came back therapeutic at 0.58, on 900 units/hr. Hgb 12.1, plt 166. No s/sx of bleeding or infusion issues noted.   Goal of Therapy:  Heparin level 0.3-0.7 units/ml Monitor platelets by anticoagulation protocol: Yes   Plan:  Continue heparin at 900 units/hr Check HL daily Monitor H&H and platelets   Antonietta Jewel, PharmD, [place] Clinical Pharmacist  Phone: [phone] 02/09/2021 11:05 AM  Please check AMION for all [place] phone numbers After 10:00 PM, call [place] [phone]

## 2021-02-09 NOTE — Progress Notes (Signed)
[facility] Progress Note Patient Name: Angela Morales DOB: 11-21-1945 MRN: [id number]   Date of Service  02/09/2021  HPI/Events of Note  Notified of K 3.3 and phos <1.3 Creatinine 0.38  eICU Interventions  Initiate Elink electrolyte replacement protocol     Intervention Category Major Interventions: Electrolyte abnormality - evaluation and management  Judd Lien 02/09/2021, 11:13 PM

## 2021-02-09 NOTE — Progress Notes (Signed)
NAME:  Angela Morales, MRN:  [id number], DOB:  29-Jan-1946, LOS: 3 ADMISSION DATE:  02/05/2021, CONSULTATION DATE:  02/07/21  REFERRING MD:  Dr Lupita Leash, CHIEF COMPLAINT:  tachypnea, hypoxemia   History of Present Illness:  75 year old woman with a history of atrial fibrillation (ablated 8/3), celiac disease, DDD who was admitted on 8/11 with bilateral upper and lower extremity weakness and paresthesias beginning about 8/8.  Developed some ascending weakness that resulted in inability to ambulate.  No other prodrome.  No clear precipitants although she was started on gabapentin about 1 month ago.  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  LP deferred initially as she was on Eliquis last dose 8/11.  IVIG started 8/12.  She has had progressive bilateral upper extremity weakness, some bulbar dysfunction difficulty swallowing, globus sensation with perception of swallowing and voice weakness.  FVC has been consistently greater than 1.0 L, NIF <-25.  She had an episode of desaturation, increased work of breathing and choking when eating Jell-O and pudding evening of 8/13.  Pertinent  Medical History   Past Medical History:  Diagnosis Date   Abnormal uterine bleeding    on HRT   Allergy    seasonal   Arthritis    Atrial fibrillation (HCC)    Celiac disease    DDD (degenerative disc disease)    Dysrhythmia    Glaucoma    Microscopic colitis    Osteoarthritis of both knees    PONV (postoperative nausea and vomiting)    SVT (supraventricular tachycardia) ([place])      Significant Hospital Events: Including procedures, antibiotic start and stop dates in addition to other pertinent events   IVIg started 8/12 CT CAP >> no dissection, bibasilar atelectasis, distended gallbladder with some pericholecystic stranding, segmental colitis question related to diverticular disease RUQ ultrasound 8/12 >> distended gallbladder without stones or pericholecystic fluid no evidence  cholecystitis MR cervical, thoracic, lumbar spine 8/12 > normal spinal cord no imaging evidence of demyelination or Guillain-Barr.  Advanced lumbar DJD, cervical DJD 8/13 Zosyn started for possible aspiration pneumonia 8/14: Intubated for progressive respiratory failure, poor bulbar function, and downtrending NIF and vital capacity sodium down to 115 from 124 but corrected to 119 with hyperglycemia.  115 was treated with hypertonic saline bolus.  IV sedating drips stopped  Interim History / Subjective:  Sedated on ventilator  Objective   Blood pressure 93/65, pulse 80, temperature 97.6 F (36.4 C), temperature source Oral, resp. rate 18, weight 66.1 kg, last menstrual period 04/20/2005, SpO2 96 %.    Vent Mode: PRVC FiO2 (%):  [50 %-100 %] 50 % Set Rate:  [16 bmp] 16 bmp Vt Set:  [430 mL] 430 mL PEEP:  [5 cmH20] 5 cmH20 Plateau Pressure:  [14 cmH20-20 cmH20] 16 cmH20   Intake/Output Summary (Last 24 hours) at 02/09/2021 0751 Last data filed at 02/09/2021 0600 Gross per 24 hour  Intake 5574.71 ml  Output 1405 ml  Net 4169.71 ml   Filed Weights   02/07/21 0530  Weight: 66.1 kg    Examination: General 75 year old female currently sedated on vent HEENT: Normocephalic atraumatic no jugular venous distention orally intubated with orogastric tube in place Pulmonary: Coarse bilateral, somewhat more diminished in the right base.  Minimal vent settings Cardiac: Regular irregular.  Atrial fibrillation on monitor Abdomen soft nontender no organomegaly Extremities: Warm dry brisk capillary refill no significant edema bilateral foot drop boots in place Neuro: Currently sedated.  She will respond to deep noxious stimulus.  I have turned sedating at bedside GU clear yellow urine  Resolved Hospital Problem list     Assessment & Plan:   Acute respiratory failure with hypoxia w/ aspiration PNA and bibasilar atx.  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  VC and NIF both downtrending.  Pcxr post intubation w/ right > L vol loss and airspace disease. PICC good position. ETT in [place] post intubation reviewed  Plan Pcxr now. 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 Pressor dependent. Did meet SIRS criteria so w/ PNA will call this a mix of septic shock and also medication related hypotension Plan Abx as above Keep euvolemic Titrate pressors for MAP > 65 Dc dilt gtt  Fluid & Electrolyte imbalance. Hyponatremia: , present on admission and acutely worse w/ NaCl infusion. Dropped as low as 115, got HT saline bolus of 50cc. Na now 124 (up 9 less than 12 hrs ago). This is too rapid however the drop to 115 corrected w/ glucose at the time was 119 so brings Na up by 5  Hypophosphatemia and Hypomagnesemia Plan KVO IVFs Stop HT saline Limit free water Replace PO4 and Mg Cont q 2 hr chems for next 8 hrs to assure stable   Hyperglycemia Plan Cont ssi w/ goal 140-180   Leukocytosis, potentially due to IVIG vs pneumonia -afebrile. 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    Glaucoma Plan Cont  timolol gtts    Best Practice (right click and "Reselect all SmartList Selections" daily)   Diet/type: tubefeeds and NPO DVT prophylaxis: systemic heparin GI prophylaxis: PPI Lines: Central line and yes and it is still needed- PICC Foley:  Yes, and it is still needed Code Status:  full code Last date of multidisciplinary goals of care discussion [Goals discussed w patient 8/13, would want full scope of care]  My cct 45 min  Erick Colace ACNP-BC [place] Pager # [phone] OR # [phone] if no answer

## 2021-02-09 NOTE — Progress Notes (Signed)
Initial Nutrition Assessment  DOCUMENTATION CODES:   Not applicable  INTERVENTION:   Tube Feeding via Cortrak: Vital 1.5 at 20 ml/hr today given electrolyte abnormalities Goal: Vital 1.5 at 45 ml/hr with Pro-Source TF 45 mL BID Provides 95 g of protein, 1700 kcals and 821 mL of free water  NUTRITION DIAGNOSIS:   Inadequate oral intake related to acute illness, dysphagia as evidenced by NPO status.   GOAL:   Patient will meet greater than or equal to 90% of their needs  MONITOR:   Vent status, Labs, Weight trends, TF tolerance  REASON FOR ASSESSMENT:   Consult, Ventilator    ASSESSMENT:   75 yo female admitted with acute respiratory failure with aspiration pneumonia, respiratory muscle weakness with bulbar dysfunction from presume GB type syndrome, encephalopathy, circulatory shock. PMH includes Celiac Disease  8/12 Admitted, IVIG started 8/14 Intubated  Pt sedated on vent support. Currently on IVIG. MRI brain today  Noted pt with dysphagia with choking after eating soft foods (pudding and jello). Plan for Cortrak today. Recommend keeping Cortrak in post extubation   No recent weight loss per weight encounters.   Hyponatremic; noted plan to fluid restrict, keep sodium in 120s  Labs: sodium 124 (L), phosphorus <1.0 (L), magnesium 1.5 (L) Meds: potassium chloride, potassium phosphate, miralax, colace    Diet Order:   Diet Order             Diet NPO time specified  Diet effective now                   EDUCATION NEEDS:   Education needs have been addressed  Skin:  Skin Assessment: Reviewed RN Assessment  Last BM:  no documented BM  Height:   Ht Readings from Last 1 Encounters:  01/28/21 5' 4"  (1.626 m)    Weight:   Wt Readings from Last 1 Encounters:  02/07/21 66.1 kg      BMI:  Body mass index is 25.01 kg/m.  Estimated Nutritional Needs:   Kcal:  1700-1900 kcals  Protein:  85-105 g  Fluid:  >/= 1.7 L   Kerman Passey MS, RDN,  LDN, CNSC Registered Dietitian III Clinical Nutrition RD Pager and On-Call Pager Number Located in [place]

## 2021-02-09 NOTE — Progress Notes (Signed)
SLP Cancellation Note  Patient Details Name: Angela Morales MRN: [id number] DOB: 16-May-1946   Cancelled treatment:       Reason Eval/Treat Not Completed: Medical issues which prohibited therapy (on vent this am). Will continue to follow.    Osie Bond., M.A. [place] Acute Rehabilitation Services Pager [phone] Office [phone]  02/09/2021, 7:17 AM

## 2021-02-09 NOTE — Progress Notes (Signed)
Physical Therapy Treatment Patient Details Name: Angela Morales MRN: [id number] DOB: [birth year] Today's Date: 02/09/2021    History of Present Illness Pt is a 75 y.o. 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 Pt also with afib with RVR. Eliquis on hold for possible need of LP; awaiting neurology consult. Intubated 8/14 due to Acute respiratory failure with hypoxia w/ aspiration PNA and bibasilar atx.[facility] Kitchen MRI negative 8/15. PMH includes afib (recent ablation 01/28/21), celiac disease, DDD, gluacoma, L TKA (2017).    PT Comments    Pt newly on the vent via ETT.  She was generally obtunded, but did follow direction and shake her head yes and no with less frequency.  Emphasis on assessment of U and LE movements, transitions to EOB, sitting balance, cervical control, but did not progress to OOB today, due to patient level of arousal and getting ready to have Cortrak placed imminently.    Follow Up Recommendations  CIR;Supervision for mobility/OOB     Equipment Recommendations  Other (comment) (TBD)    Recommendations for Other Services       Precautions / Restrictions Precautions Precautions: Fall Restrictions Weight Bearing Restrictions: No    Mobility  Bed Mobility Overal bed mobility: Needs Assistance Bed Mobility: Rolling;Sidelying to Sit;Sit to Sidelying Rolling: Total assist;+2 for physical assistance Sidelying to sit: Total assist;+2 for physical assistance     Sit to sidelying: +2 for physical assistance;Total assist      Transfers                 General transfer comment: On vent today and relatively obtunded.  Not safe to attempt OOB.  Ambulation/Gait                 Stairs             Wheelchair Mobility    Modified Rankin (Stroke Patients Only)       Balance Overall balance assessment: Needs assistance   Sitting balance-Leahy Scale: Poor Sitting balance -  Comments: Sat 5 min EOB working on upright sitting, UE extension to assist with balance, activation or UE and LE's                                    Cognition Arousal/Alertness: Awake/alert Behavior During Therapy: WFL for tasks assessed/performed Overall Cognitive Status: Difficult to assess                                 General Comments: pt able to nod yes/no, give thumbs up and follow simple commands      Exercises Other Exercises Other Exercises: PROM, graded AAROM to bil UE's and LE's    General Comments General comments (skin integrity, edema, etc.): vss on 50% FiO2, 5 Peep      Pertinent Vitals/Pain Pain Assessment: Faces Faces Pain Scale: Hurts little more Pain Location: back Pain Descriptors / Indicators: Grimacing;Discomfort;Guarding Pain Intervention(s): Limited activity within patient's tolerance    Home Living Family/patient expects to be discharged to:: Private residence Living Arrangements: Spouse/significant other;Children Available Help at Discharge: Family;Available PRN/intermittently Type of Home: House Home Access: Stairs to enter Entrance Stairs-Rails: Left Home Layout: Multi-level;Able to live on main level with bedroom/bathroom;Full bath on main level Home Equipment: None Additional Comments: Lives with husband and son; husband works-- per PT eval  Prior Function Level of Independence: Independent      Comments: Independent without DME; retired Press photographer; drives; enjoys babysitting grandkids (22 and 20 y.o.), enjoys time with cat and dog, watching tv   PT Goals (current goals can now be found in the care plan section) Acute Rehab PT Goals PT Goal Formulation: With patient/family Time For Goal Achievement: 02/21/21 Potential to Achieve Goals: Good Progress towards PT goals: Not progressing toward goals - comment (Newly on the vent)    Frequency    Min 4X/week      PT Plan Current plan  remains appropriate    Co-evaluation PT/OT/SLP Co-Evaluation/Treatment: Yes Reason for Co-Treatment: Complexity of the patient's impairments (multi-system involvement) PT goals addressed during session: Mobility/safety with mobility OT goals addressed during session: ADL's and self-care      AM-PAC PT "6 Clicks" Mobility   Outcome Measure  Help needed turning from your back to your side while in a flat bed without using bedrails?: Total Help needed moving from lying on your back to sitting on the side of a flat bed without using bedrails?: Total Help needed moving to and from a bed to a chair (including a wheelchair)?: Total Help needed standing up from a chair using your arms (e.g., wheelchair or bedside chair)?: Total Help needed to walk in hospital room?: Total Help needed climbing 3-5 steps with a railing? : Total 6 Click Score: 6    End of Session Equipment Utilized During Treatment: Oxygen Activity Tolerance: Patient limited by lethargy;Treatment limited secondary to medical complications (Comment);Other (comment) (newly on the vent via ETT) Patient left: in bed;with call bell/phone within reach;with bed alarm set Nurse Communication: Mobility status;Need for lift equipment PT Visit Diagnosis: Other abnormalities of gait and mobility (R26.89);Muscle weakness (generalized) (M62.81);Other symptoms and signs involving the nervous system (R29.898);Hemiplegia and hemiparesis Hemiplegia - caused by: Unspecified (Treatment directed toward GBS)     Time: 0223-3612 PT Time Calculation (min) (ACUTE ONLY): 23 min  Charges:  $Therapeutic Activity: 8-22 mins                     02/09/2021  Angela Morales., PT Acute Rehabilitation Services [phone]  (pager) [phone]  (office)   Angela Morales 02/09/2021, 3:32 PM

## 2021-02-10 ENCOUNTER — Inpatient Hospital Stay (HOSPITAL_COMMUNITY): Payer: Medicare Other

## 2021-02-10 DIAGNOSIS — J9601 Acute respiratory failure with hypoxia: Secondary | ICD-10-CM | POA: Diagnosis not present

## 2021-02-10 DIAGNOSIS — G610 Guillain-Barre syndrome: Secondary | ICD-10-CM | POA: Diagnosis not present

## 2021-02-10 LAB — BASIC METABOLIC PANEL
Anion gap: 8 (ref 5–15)
Anion gap: 8 (ref 5–15)
Anion gap: 8 (ref 5–15)
BUN: 12 mg/dL (ref 8–23)
BUN: 11 mg/dL (ref 8–23)
BUN: 11 mg/dL (ref 8–23)
CO2: 24 mmol/L (ref 22–32)
CO2: 28 mmol/L (ref 22–32)
CO2: 29 mmol/L (ref 22–32)
Calcium: 7.6 mg/dL — ABNORMAL LOW (ref 8.9–10.3)
Calcium: 8 mg/dL — ABNORMAL LOW (ref 8.9–10.3)
Calcium: 7.8 mg/dL — ABNORMAL LOW (ref 8.9–10.3)
Chloride: 93 mmol/L — ABNORMAL LOW (ref 98–111)
Chloride: 88 mmol/L — ABNORMAL LOW (ref 98–111)
Chloride: 91 mmol/L — ABNORMAL LOW (ref 98–111)
Creatinine, Ser: 0.45 mg/dL (ref 0.44–1.00)
Creatinine, Ser: 0.51 mg/dL (ref 0.44–1.00)
Creatinine, Ser: 0.53 mg/dL (ref 0.44–1.00)
GFR, Estimated: >60 mL/min (ref >60)
GFR, Estimated: >60 mL/min (ref >60)
GFR, Estimated: >60 mL/min (ref >60)
Glucose, Bld: 122 mg/dL — ABNORMAL HIGH (ref 70–99)
Glucose, Bld: 111 mg/dL — ABNORMAL HIGH (ref 70–99)
Glucose, Bld: 108 mg/dL — ABNORMAL HIGH (ref 70–99)
Potassium: 4.1 mmol/L (ref 3.5–5.1)
Potassium: 4.4 mmol/L (ref 3.5–5.1)
Potassium: 3.7 mmol/L (ref 3.5–5.1)
Sodium: 125 mmol/L — ABNORMAL LOW (ref 135–145)
Sodium: 124 mmol/L — ABNORMAL LOW (ref 135–145)
Sodium: 128 mmol/L — ABNORMAL LOW (ref 135–145)

## 2021-02-10 LAB — MAGNESIUM: Magnesium: 1.8 mg/dL (ref 1.7–2.4)

## 2021-02-10 LAB — ACETYLCHOLINE RECEPTOR AB, ALL
Acety choline binding ab: 0.11 nmol/L (ref 0.00–0.24)
Acetylchol Block Ab: 18 % (ref 0–25)

## 2021-02-10 LAB — GLUCOSE, CAPILLARY
Glucose-Capillary: 118 mg/dL — ABNORMAL HIGH (ref 70–99)
Glucose-Capillary: 112 mg/dL — ABNORMAL HIGH (ref 70–99)
Glucose-Capillary: 98 mg/dL (ref 70–99)
Glucose-Capillary: 105 mg/dL — ABNORMAL HIGH (ref 70–99)
Glucose-Capillary: 105 mg/dL — ABNORMAL HIGH (ref 70–99)

## 2021-02-10 LAB — CBC
HCT: 34.1 % — ABNORMAL LOW (ref 36.0–46.0)
Hemoglobin: 11.7 g/dL — ABNORMAL LOW (ref 12.0–15.0)
MCH: 33.1 pg (ref 26.0–34.0)
MCHC: 34.3 g/dL (ref 30.0–36.0)
MCV: 96.3 fL (ref 80.0–100.0)
Platelets: 155 10*3/uL (ref 150–400)
RBC: 3.54 MIL/uL — ABNORMAL LOW (ref 3.87–5.11)
RDW: 12.6 % (ref 11.5–15.5)
WBC: 15.2 10*3/uL — ABNORMAL HIGH (ref 4.0–10.5)
nRBC: 0 % (ref 0.0–0.2)

## 2021-02-10 LAB — PROCALCITONIN: Procalcitonin: 0.35 ng/mL

## 2021-02-10 LAB — TRIGLYCERIDES: Triglycerides: 50 mg/dL (ref <150)

## 2021-02-10 LAB — PHOSPHORUS: Phosphorus: 3.1 mg/dL (ref 2.5–4.6)

## 2021-02-10 LAB — HEPARIN LEVEL (UNFRACTIONATED): Heparin Unfractionated: 0.52 IU/mL (ref 0.30–0.70)

## 2021-02-10 MED ORDER — PHENYLEPHRINE 40 MCG/ML (10ML) SYRINGE FOR IV PUSH (FOR BLOOD PRESSURE SUPPORT)
PREFILLED_SYRINGE | INTRAVENOUS | Status: AC
  Filled 2021-02-10: qty 10

## 2021-02-10 MED ORDER — ROCURONIUM BROMIDE 10 MG/ML (PF) SYRINGE
PREFILLED_SYRINGE | INTRAVENOUS | Status: AC
  Administered 2021-02-10: 60 mg
  Filled 2021-02-10: qty 10

## 2021-02-10 MED ORDER — DOCUSATE SODIUM 50 MG/5ML PO LIQD: 100.0000 mg | Freq: Two times a day (BID) | ORAL | Status: DC

## 2021-02-10 MED ORDER — HEPARIN (PORCINE) 25000 UT/250ML-% IV SOLN
1050.0000 [IU]/h | INTRAVENOUS | Status: AC
  Administered 2021-02-10 – 2021-02-12 (×4): 900 [IU]/h via INTRAVENOUS
  Filled 2021-02-10 (×4): qty 250

## 2021-02-10 MED ORDER — VITAL 1.5 CAL PO LIQD
1000.0000 mL | ORAL | Status: DC
  Administered 2021-02-10 – 2021-02-22 (×14): 1000 mL
  Filled 2021-02-10: qty 1000

## 2021-02-10 MED ORDER — VITAL 1.5 CAL PO LIQD: 1000.0000 mL | ORAL | Status: DC

## 2021-02-10 MED ORDER — ETOMIDATE 2 MG/ML IV SOLN
40.0000 mg | Freq: Once | INTRAVENOUS | Status: AC
  Administered 2021-02-10: 10 mg via INTRAVENOUS
  Filled 2021-02-10: qty 20

## 2021-02-10 MED ORDER — MIDAZOLAM HCL 2 MG/2ML IJ SOLN: 1.0000 mg | INTRAMUSCULAR | Status: DC | PRN

## 2021-02-10 MED ORDER — STERILE WATER FOR INJECTION IJ SOLN
INTRAMUSCULAR | Status: AC
  Filled 2021-02-10: qty 10

## 2021-02-10 MED ORDER — POLYETHYLENE GLYCOL 3350 17 G PO PACK: 17.0000 g | PACK | Freq: Every day | ORAL | Status: DC

## 2021-02-10 MED ORDER — FENTANYL CITRATE (PF) 100 MCG/2ML IJ SOLN: 25.0000 ug | INTRAMUSCULAR | Status: DC | PRN

## 2021-02-10 MED ORDER — CLONAZEPAM 0.1 MG/ML ORAL SUSPENSION
0.5000 mg | Freq: Two times a day (BID) | ORAL | Status: DC
  Filled 2021-02-10 (×2): qty 5

## 2021-02-10 MED ORDER — FUROSEMIDE 10 MG/ML IJ SOLN
40.0000 mg | Freq: Once | INTRAMUSCULAR | Status: AC
  Administered 2021-02-10: 40 mg via INTRAVENOUS
  Filled 2021-02-10: qty 4

## 2021-02-10 MED ORDER — FENTANYL CITRATE (PF) 100 MCG/2ML IJ SOLN
200.0000 ug | Freq: Once | INTRAMUSCULAR | Status: AC
  Administered 2021-02-10: 100 ug via INTRAVENOUS
  Filled 2021-02-10: qty 4

## 2021-02-10 MED ORDER — POTASSIUM CHLORIDE 20 MEQ PO PACK
60.0000 meq | PACK | Freq: Every day | ORAL | Status: DC
Start: 2021-02-10 — End: 2021-03-02
  Administered 2021-02-10 – 2021-03-02 (×20): 60 meq
  Filled 2021-02-10 (×20): qty 3

## 2021-02-10 MED ORDER — CLONAZEPAM 0.5 MG PO TBDP
0.5000 mg | ORAL_TABLET | Freq: Two times a day (BID) | ORAL | Status: DC
  Administered 2021-02-10 – 2021-02-11 (×4): 0.5 mg
  Filled 2021-02-10 (×4): qty 1

## 2021-02-10 MED ORDER — MIDAZOLAM HCL 2 MG/2ML IJ SOLN
5.0000 mg | Freq: Once | INTRAMUSCULAR | Status: DC
  Filled 2021-02-10: qty 6

## 2021-02-10 MED ORDER — SODIUM CHLORIDE 0.9 % IV SOLN
INTRAVENOUS | Status: DC | PRN
  Administered 2021-02-12: 250 mL via INTRAVENOUS
  Administered 2021-02-18 – 2021-02-24 (×3): 500 mL via INTRAVENOUS

## 2021-02-10 MED ORDER — VECURONIUM BROMIDE 10 MG IV SOLR
10.0000 mg | Freq: Once | INTRAVENOUS | Status: DC
  Filled 2021-02-10: qty 10

## 2021-02-10 MED ORDER — METOPROLOL TARTRATE 25 MG/10 ML ORAL SUSPENSION
50.0000 mg | Freq: Two times a day (BID) | ORAL | Status: DC
  Administered 2021-02-10 – 2021-02-11 (×4): 50 mg
  Filled 2021-02-10 (×3): qty 20

## 2021-02-10 NOTE — Plan of Care (Signed)
  Problem: Education: Goal: Knowledge of General Education information will improve Description: Including pain rating scale, medication(s)/side effects and non-pharmacologic comfort measures 02/10/2021 2311 by Tiburcio Bash, RN Outcome: Progressing 02/10/2021 2311 by Tiburcio Bash, RN Outcome: Progressing   Problem: Health Behavior/Discharge Planning: Goal: Ability to manage health-related needs will improve 02/10/2021 2311 by Tiburcio Bash, RN Outcome: Progressing 02/10/2021 2311 by Tiburcio Bash, RN Outcome: Progressing   Problem: Clinical Measurements: Goal: Ability to maintain clinical measurements within normal limits will improve 02/10/2021 2311 by Tiburcio Bash, RN Outcome: Progressing 02/10/2021 2311 by Tiburcio Bash, RN Outcome: Progressing Goal: Will remain free from infection 02/10/2021 2311 by Tiburcio Bash, RN Outcome: Progressing 02/10/2021 2311 by Tiburcio Bash, RN Outcome: Progressing Goal: Diagnostic test results will improve 02/10/2021 2311 by Tiburcio Bash, RN Outcome: Progressing 02/10/2021 2311 by Tiburcio Bash, RN Outcome: Progressing Goal: Respiratory complications will improve 02/10/2021 2311 by Tiburcio Bash, RN Outcome: Progressing 02/10/2021 2311 by Tiburcio Bash, RN Outcome: Progressing Goal: Cardiovascular complication will be avoided 02/10/2021 2311 by Tiburcio Bash, RN Outcome: Progressing 02/10/2021 2311 by Tiburcio Bash, RN Outcome: Progressing   Problem: Activity: Goal: Risk for activity intolerance will decrease 02/10/2021 2311 by Tiburcio Bash, RN Outcome: Progressing 02/10/2021 2311 by Tiburcio Bash, RN Outcome: Progressing   Problem: Coping: Goal: Level of anxiety will decrease 02/10/2021 2311 by Tiburcio Bash, RN Outcome: Progressing 02/10/2021 2311 by  Tiburcio Bash, RN Outcome: Progressing   Problem: Elimination: Goal: Will not experience complications related to bowel motility 02/10/2021 2311 by Tiburcio Bash, RN Outcome: Progressing 02/10/2021 2311 by Tiburcio Bash, RN Outcome: Progressing Goal: Will not experience complications related to urinary retention 02/10/2021 2311 by Tiburcio Bash, RN Outcome: Progressing 02/10/2021 2311 by Tiburcio Bash, RN Outcome: Progressing   Problem: Skin Integrity: Goal: Risk for impaired skin integrity will decrease 02/10/2021 2311 by Tiburcio Bash, RN Outcome: Progressing 02/10/2021 2311 by Tiburcio Bash, RN Outcome: Progressing   Problem: Safety: Goal: Ability to remain free from injury will improve 02/10/2021 2311 by Tiburcio Bash, RN Outcome: Progressing 02/10/2021 2311 by Tiburcio Bash, RN Outcome: Progressing   Problem: Education: Goal: Knowledge of disease or condition will improve 02/10/2021 2311 by Tiburcio Bash, RN Outcome: Progressing 02/10/2021 2311 by Tiburcio Bash, RN Outcome: Progressing Goal: Understanding of medication regimen will improve 02/10/2021 2311 by Tiburcio Bash, RN Outcome: Progressing 02/10/2021 2311 by Tiburcio Bash, RN Outcome: Progressing Goal: Individualized Educational Video(s) 02/10/2021 2311 by Tiburcio Bash, RN Outcome: Progressing 02/10/2021 2311 by Tiburcio Bash, RN Outcome: Progressing

## 2021-02-10 NOTE — Progress Notes (Signed)
Subjective: She has developed dysconjugate gaze.   Exam: Vitals:   02/10/21 0700 02/10/21 0803  BP: 116/69   Pulse: (!) 45   Resp: 17   Temp:  98.9 F (37.2 C)  SpO2: 96% 93%   Gen: In bed, NAD Resp: non-labored breathing, no acute distress Abd: soft, nt  Neuro: MS: awakens to gentle stimulation, answers questions with nods/shakes.  CN:she has exotropia with impaired adduction bilaterally, unclear how much is related to sleepiness Motor: She has severe weakness of the LE with 1/5 strength at the knee, no movemetn at the ankles, length dependant UE weakness as well with little dorsiflexion at the wrist, some fingerextension on the right with absent grip. She has 2/5 grip on the left.   Sensory:she endorses diminished sensation in bilateral legs.  OFV:WAQLRJ.   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 With cranial nerve involvement, this is most consistent with AIDP, but other GBS variants are also possible. Certainly with the severity of her symptoms, she likely has a long road ahead of her in terms of recovery. There has not been any strong evidence to support retreatment after an initial IVIG course, and more recently two studies have suggested no benefit of additional IVIG in non-responders, but the actual ideal dose of IVIG has not been clearly identifed. A study comparing 2.4 g/kg to 1.2 g/kg found benefit of the higher dose, and therefore if she is continuing to worsen, may consider early additional IVIG 1g/kg.   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 No significant autonomic instability thus far.   Recommendations: 1) Continue IVIG, today is day 5/5 for total dose 2g/kg 2) Na management per CCM 3) will continue to follow.    Roland Rack, MD [facility] [phone]  If 7pm- 7am, please page neurology on call as listed in [place].

## 2021-02-10 NOTE — Procedures (Signed)
Diagnostic Bronchoscopy  Angela Morales  [id number]  10/27/1945  Date:02/10/21  Time:3:05 PM   Provider Performing:Yvanna Vidas C Stephenson Cichy   Procedure: Diagnostic Bronchoscopy (67737)  Indication(s) Assist with direct visualization of tracheostomy placement  Consent Risks of the procedure as well as the alternatives and risks of each were explained to the patient and/or caregiver.  Consent for the procedure was obtained.   Anesthesia See separate tracheostomy note   Time Out Verified patient identification, verified procedure, site/side was marked, verified correct patient position, special equipment/implants available, medications/allergies/relevant history reviewed, required imaging and test results available.   Sterile Technique Usual hand hygiene, masks, gowns, and gloves were used   Procedure Description Bronchoscope advanced through endotracheal tube and into airway.  After suctioning out tracheal secretions, bronchoscope used to provide direct visualization of tracheostomy placement.   Complications/Tolerance None; patient tolerated the procedure well.   EBL None  Specimen(s) None

## 2021-02-10 NOTE — Progress Notes (Signed)
Upon 1600 Reassessment, Pt found to be unresponsive to painful stimulus with fixed & non-reactive pupils, a change since 1400 Neuro check. Consulted with Dr. Tamala Julian, STAT CT ordered, Heparin gtt stopped.

## 2021-02-10 NOTE — Progress Notes (Signed)
SLP Cancellation Note  Patient Details Name: Angela Morales MRN: [id number] DOB: September 23, 1945   Cancelled treatment:       Reason Eval/Treat Not Completed: Medical issues which prohibited therapy. Pt sedated on vent this am. Will continue to follow.     Osie Bond., M.A. [facility] Pager [phone] Office [phone]  02/10/2021, 8:30 AM

## 2021-02-10 NOTE — Procedures (Signed)
Percutaneous Tracheostomy Procedure Note   Angela Morales  [id number]  [birth year]  Date:02/10/21  Time:12:51 PM   Provider Performing:Pete E Kary Kos  Procedure: Percutaneous Tracheostomy with Bronchoscopic Guidance (70350)  Indication(s) Vent weaning and comfort   Consent Risks of the procedure as well as the alternatives and risks of each were explained to the patient and/or caregiver.  Consent for the procedure was obtained.  Anesthesia Etomidate, Versed, Fentanyl, Vecuronium   Time Out Verified patient identification, verified procedure, site/side was marked, verified correct patient position, special equipment/implants available, medications/allergies/relevant history reviewed, required imaging and test results available.   Sterile Technique Maximal sterile technique including sterile barrier drape, hand hygiene, sterile gown, sterile gloves, mask, hair covering.    Procedure Description Appropriate anatomy identified by palpation.  Patient's neck prepped and draped in sterile fashion.  1% lidocaine with epinephrine was used to anesthetize skin overlying neck.  1.5cm incision made and blunt dissection performed until tracheal rings could be easily palpated.   Then a size 6 Shiley tracheostomy was placed under bronchoscopic visualization using usual Seldinger technique and serial dilation.   Bronchoscope confirmed placement above the carina.  Tracheostomy was sutured in place with adhesive pad to protect skin under pressure.    Patient connected to ventilator.   Complications/Tolerance None; patient tolerated the procedure well. Chest X-ray is ordered to confirm no post-procedural complication.   EBL Minimal   Specimen(s) None   Erick Colace ACNP-BC [place] Pager # [phone] OR # [phone] if no answer

## 2021-02-10 NOTE — Progress Notes (Signed)
ANTICOAGULATION CONSULT NOTE  Pharmacy Consult for heparin Indication: atrial fibrillation  Allergies  Allergen Reactions   Gluten Meal Other (See Comments)    celiac disease   Cefdinir Rash    Patient Measurements: Weight: 62.9 kg (138 lb 10.7 oz) Heparin Dosing Weight: 64kg  Vital Signs: Temp: 99.1 F (37.3 C) (08/16 1119) Temp Source: Axillary (08/16 1119) BP: 120/78 (08/16 1119) Pulse Rate: 103 (08/16 1119)  Labs: Recent Labs    02/08/21 0553 02/08/21 0554 02/08/21 0900 02/08/21 1038 02/08/21 1439 02/09/21 0520 02/09/21 0932 02/09/21 2156 02/10/21 0500 02/10/21 0825 02/10/21 1157  HGB 15.5*  --   --  16.3*  --  12.1  --   --   --  11.7*  --   HCT 42.9  --   --  48.0*  --  33.4*  --   --   --  34.1*  --   PLT 227  --   --   --   --  166  --   --   --  155  --   APTT 79*  --   --   --   --   --   --   --   --   --   --   HEPARINUNFRC  --  0.59  --   --   --  0.58  --   --  0.52  --   --   CREATININE 0.45  --    < >  --    < > 0.57   < > 0.38*  --  0.45 0.51   < > = values in this interval not displayed.     Estimated Creatinine Clearance: 52.5 mL/min (by C-G formula based on SCr of 0.51 mg/dL).   Medical History: Past Medical History:  Diagnosis Date   Abnormal uterine bleeding    on HRT   Allergy    seasonal   Arthritis    Atrial fibrillation (HCC)    Celiac disease    DDD (degenerative disc disease)    Dysrhythmia    Glaucoma    Microscopic colitis    Osteoarthritis of both knees    PONV (postoperative nausea and vomiting)    SVT (supraventricular tachycardia) (HCC)     Medications:  Facility-Administered Medications Prior to Admission  Medication Dose Route Frequency Provider Last Rate Last Admin   0.9 %  sodium chloride infusion  500 mL Intravenous Continuous [name], Estill Cotta III, MD       Medications Prior to Admission  Medication Sig Dispense Refill Last Dose   apixaban (ELIQUIS) 5 MG TABS tablet TAKE 1 TABLET BY MOUTH TWICE A DAY 180  tablet 1 02/05/2021 at 0830   gabapentin (NEURONTIN) 300 MG capsule Take 300 mg by mouth at bedtime.   02/04/2021   ibuprofen (ADVIL,MOTRIN) 200 MG tablet Take 400 mg by mouth every 6 (six) hours as needed for moderate pain.   Past Week   loratadine (CLARITIN) 10 MG tablet Take 10 mg by mouth daily as needed for allergies.   PRN   metoprolol succinate (TOPROL-XL) 25 MG 24 hr tablet TAKE 1 TABLET BY MOUTH EVERY DAY 90 tablet 3 02/05/2021 at 0830   Polyethyl Glycol-Propyl Glycol (SYSTANE) 0.4-0.3 % SOLN Place 1-2 drops into both eyes 3 (three) times daily as needed (dry eyes).   Past Week   psyllium (METAMUCIL) 58.6 % packet Take 1 packet by mouth in the morning.   02/05/2021   timolol (TIMOPTIC) 0.5 %  ophthalmic solution Place 1 drop into the left eye 2 (two) times daily.   02/05/2021   ZIOPTAN 0.0015 % SOLN Place 1 drop into the left eye at bedtime.   02/04/2021    Assessment: 75 year old female with history of afib on apixaban prior to admit. 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 Last dose of apixaban appears to be prior to admission on 8/11 at 0830. Patient back in afib this evening, will bridge with heparin while apixaban washes out.   Heparin level came back therapeutic at 0.52, on 900 units/hr this morning before being stopped for trach placement today. Hgb 11.7, plt 155. No s/sx of bleeding noted. Okay to restart heparin infusion 1 hour after trach placed (ended ~1230).    Goal of Therapy:  Heparin level 0.3-0.7 units/ml Monitor platelets by anticoagulation protocol: Yes   Plan:  Restart heparin at 900 units/hr Check HL daily Monitor H&H and platelets   Antonietta Jewel, PharmD, BCCCP Clinical Pharmacist  Phone: [phone] 02/10/2021 1:32 PM  Please check AMION for all [facility] phone numbers After 10:00 PM, call [place] [phone]

## 2021-02-10 NOTE — Progress Notes (Signed)
eLink Physician-Brief Progress Note Patient Name: Angela Morales DOB: 1946-03-25 MRN: [id number]   Date of Service  02/10/2021  HPI/Events of Note  Heparin gtt held and stat CT brain ordered earlier to r/o acute ICH, CT brain with no acute intracranial abnormality.  eICU Interventions  Order entered to resume Heparin gtt.        Kerry Kass Seana Underwood 02/10/2021, 8:08 PM

## 2021-02-10 NOTE — Progress Notes (Signed)
NAME:  Angela Morales, MRN:  [id number], DOB:  06/05/1946, LOS: 4 ADMISSION DATE:  02/05/2021, CONSULTATION DATE:  02/07/21  REFERRING MD:  Dr Lupita Leash, CHIEF COMPLAINT:  tachypnea, hypoxemia   History of Present Illness:  75 year old woman with a history of atrial fibrillation (ablated 8/3), celiac disease, DDD who was admitted on 8/11 with bilateral upper and lower extremity weakness and paresthesias beginning about 8/8.  Developed some ascending weakness that resulted in inability to ambulate.  No other prodrome.  No clear precipitants although she was started on gabapentin about 1 month ago.  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  LP deferred initially as she was on Eliquis last dose 8/11.  IVIG started 8/12.  She has had progressive bilateral upper extremity weakness, some bulbar dysfunction difficulty swallowing, globus sensation with perception of swallowing and voice weakness.  FVC has been consistently greater than 1.0 L, NIF <-25.  She had an episode of desaturation, increased work of breathing and choking when eating Jell-O and pudding evening of 8/13.  Pertinent  Medical History   Past Medical History:  Diagnosis Date   Abnormal uterine bleeding    on HRT   Allergy    seasonal   Arthritis    Atrial fibrillation (HCC)    Celiac disease    DDD (degenerative disc disease)    Dysrhythmia    Glaucoma    Microscopic colitis    Osteoarthritis of both knees    PONV (postoperative nausea and vomiting)    SVT (supraventricular tachycardia) ([place])      Significant Hospital Events: Including procedures, antibiotic start and stop dates in addition to other pertinent events   IVIg started 8/12 CT CAP >> no dissection, bibasilar atelectasis, distended gallbladder with some pericholecystic stranding, segmental colitis question related to diverticular disease RUQ ultrasound 8/12 >> distended gallbladder without stones or pericholecystic fluid no evidence  cholecystitis MR cervical, thoracic, lumbar spine 8/12 > normal spinal cord no imaging evidence of demyelination or Guillain-Barr.  Advanced lumbar DJD, cervical DJD 8/13 Zosyn started for possible aspiration pneumonia 8/14: Intubated for progressive respiratory failure, poor bulbar function, and downtrending NIF and vital capacity sodium down to 115 from 124 but corrected to 119 with hyperglycemia.  115 was treated with hypertonic saline bolus.  IV sedating drips stopped.  Interim History / Subjective:  Sedated on ventilator. Brother in the room this morning. Discussed trach for this afternoon. Brother states family and patient husband in agreement with plan.   Objective   Blood pressure 116/69, pulse (!) 45, temperature 99.3 F (37.4 C), temperature source Axillary, resp. rate 17, weight 66.1 kg, last menstrual period 04/20/2005, SpO2 96 %.    Vent Mode: PRVC FiO2 (%):  [30 %-50 %] 50 % Set Rate:  [16 bmp] 16 bmp Vt Set:  [430 mL] 430 mL PEEP:  [5 cmH20] 5 cmH20 Plateau Pressure:  [14 cmH20-20 cmH20] 17 cmH20   Intake/Output Summary (Last 24 hours) at 02/10/2021 0740 Last data filed at 02/10/2021 0700 Gross per 24 hour  Intake 1253.57 ml  Output 925 ml  Net 328.57 ml   Filed Weights   02/07/21 0530  Weight: 66.1 kg    Examination: General: thin elderly female lying in bed, sedated HEENT: Normocephalic, atraumatic, orally intubated Pulmonary: orally intubated and mechanically ventilated, Ccoarse breath sounds bilaterally  Cardiac: regularly irregular, a flutter on monitor, no M/R/G, no lower extremity edema Abdomen: soft, nontender, nondistended, bowel sounds present Extremities: warm, dry, well perfused Neuro: somewhat sedated,  following commands, unable to move lower extremities on command, can move upper extremities on command, unable to stick out tongue or open eyes GU: foley in place  Resolved Hospital Problem list     Assessment & Plan:   Acute respiratory failure  with hypoxia w/ aspiration PNA and bibasilar atx.  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   VC and NIF both downtrending. 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 Pressor dependent. Did meet SIRS criteria so w/ PNA will call this a mix of septic shock and also medication related hypotension No longer requiring pressors as of PM of 8/14.   - Abx as above - Keep euvolemic - MAP goal > 65  Fluid & Electrolyte imbalance. Hyponatremia: , present on admission and acutely worse w/ NaCl infusion. Dropped as low as 115, got HT saline bolus of 50cc. Na then up to 124 (up 9 less than 12 hrs prior). This is too rapid however the drop to 115 corrected w/ glucose at the time was 119 so brings Na up by 5. Na this AM at 125.   - fluid restriction  - PRN hypertonic saline - goal Na in 120s if able - Limit free water - trend electrolytes - transition to Lovenox from heparin for further fluid restriction given good kidney function   Hyperglycemia - Cont ssi w/ goal 140-180   Leukocytosis, potentially due to IVIG vs pneumonia Afebrile. WBC down trending from 2 days ago, but stable today. Today is also last day of IVIG, hopefully tomorrow significant improvement in leukocytosis with no further IVIG infusions.   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

## 2021-02-10 NOTE — Progress Notes (Signed)
Nutrition Follow Up  DOCUMENTATION CODES:   Not applicable  INTERVENTION:   Tube Feeding -Vital 1.5 at 20 ml/hr via Cortrak -Increase by 10 ml Q6 hours to goal rate of 45 ml/hr (1080) -Pro-Source TF 45 mL BID Provides 95 g of protein, 1700 kcals and 821 mL of free water  Monitor magnesium, potassium, and phosphorus daily for at least 3 days, MD to replete as needed, as pt is at risk for refeeding syndrome.   NUTRITION DIAGNOSIS:   Inadequate oral intake related to acute illness, dysphagia as evidenced by NPO status.  Ongoing  GOAL:   Patient will meet greater than or equal to 90% of their needs  Addressed via TF  MONITOR:   Vent status, Labs, Weight trends, TF tolerance  REASON FOR ASSESSMENT:   Consult, Ventilator    ASSESSMENT:   75 yo female admitted with acute respiratory failure with aspiration pneumonia, respiratory muscle weakness with bulbar dysfunction from presume GB type syndrome, encephalopathy, circulatory shock. PMH includes Celiac Disease  8/12 Admitted, IVIG started 8/14 Intubated  Off propofol. Underwent bedside trach placement this afternoon. Day 5 of 5 IVIG. No plan for PLEX. Holding sodium at 120s, limiting free water.   Cortrak placed at bedside yesterday. Tolerating concentrated trickle tube feedings. Having bowel movements. Titrate tube feeding slowly to goal.   Admission weight: 66.1 kg  Current weight: 62.9 kg   UOP: 1975 ml since this am  Medications: colace, miralax, 60 mEq Kcl daily Labs: Na 124 (L) CBG 112-119  Diet Order:   Diet Order             Diet NPO time specified  Diet effective midnight           Diet NPO time specified  Diet effective now                   EDUCATION NEEDS:   Education needs have been addressed  Skin:  Skin Assessment: Reviewed RN Assessment  Last BM:  8/16  Height:   Ht Readings from Last 1 Encounters:  01/28/21 5' 4"  (1.626 m)    Weight:   Wt Readings from Last 1 Encounters:   02/10/21 62.9 kg    BMI:  Body mass index is 23.8 kg/m.  Estimated Nutritional Needs:   Kcal:  1700-1900 kcals  Protein:  85-105 g  Fluid:  >/= 1.7 L  Deyonna Fitzsimmons MS, RD, LDN, CNSC Clinical Nutrition Pager listed in [place]

## 2021-02-10 NOTE — Progress Notes (Signed)
Pt was transported to CT scan and back without complications.

## 2021-02-10 NOTE — Progress Notes (Signed)
PT Cancellation Note  Patient Details Name: Angela Morales MRN: [id number] DOB: [birth year]   Cancelled Treatment:    Reason Eval/Treat Not Completed: Medical issues which prohibited therapy  Sedated on vent;   Will follow for appropriateness of PT;   Roney [place], [place]  Acute Rehabilitation Services Pager [phone] Office [place] 02/10/2021, 10:17 AM

## 2021-02-10 NOTE — Progress Notes (Addendum)
NAME:  MARENE GILLIAM, MRN:  [id number], DOB:  1946/03/17, LOS: 4 ADMISSION DATE:  02/05/2021, CONSULTATION DATE:  02/07/21  REFERRING MD:  Dr Lupita Leash, CHIEF COMPLAINT:  tachypnea, hypoxemia   History of Present Illness:  75 year old woman with a history of atrial fibrillation (ablated 8/3), celiac disease, DDD who was admitted on 8/11 with bilateral upper and lower extremity weakness and paresthesias beginning about 8/8.  Developed some ascending weakness that resulted in inability to ambulate.  No other prodrome.  No clear precipitants although she was started on gabapentin about 1 month ago.  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  LP deferred initially as she was on Eliquis last dose 8/11.  IVIG started 8/12.  She has had progressive bilateral upper extremity weakness, some bulbar dysfunction difficulty swallowing, globus sensation with perception of swallowing and voice weakness.  FVC has been consistently greater than 1.0 L, NIF <-25.  She had an episode of desaturation, increased work of breathing and choking when eating Jell-O and pudding evening of 8/13.  Pertinent  Medical History   Past Medical History:  Diagnosis Date   Abnormal uterine bleeding    on HRT   Allergy    seasonal   Arthritis    Atrial fibrillation (HCC)    Celiac disease    DDD (degenerative disc disease)    Dysrhythmia    Glaucoma    Microscopic colitis    Osteoarthritis of both knees    PONV (postoperative nausea and vomiting)    SVT (supraventricular tachycardia) ([place])      Significant Hospital Events: Including procedures, antibiotic start and stop dates in addition to other pertinent events   IVIg started 8/12 CT CAP >> no dissection, bibasilar atelectasis, distended gallbladder with some pericholecystic stranding, segmental colitis question related to diverticular disease RUQ ultrasound 8/12 >> distended gallbladder without stones or pericholecystic fluid no evidence  cholecystitis MR cervical, thoracic, lumbar spine 8/12 > normal spinal cord no imaging evidence of demyelination or Guillain-Barr.  Advanced lumbar DJD, cervical DJD 8/13 Zosyn started for possible aspiration pneumonia 8/14: Intubated for progressive respiratory failure, poor bulbar function, and downtrending NIF and vital capacity sodium down to 115 from 124 but corrected to 119 with hyperglycemia.  115 was treated with hypertonic saline bolus.   8/15: IV sedating drips stopped, placed back on propofol in the evening hours.  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  Free water limited. 8/16-day 5 of 5 IVIG.  Remains profoundly weak.  Tracheostomy planned.  Getting IV Lasix for volume overload. Interim History / Subjective:  Sedated on ventilator  Objective   Blood pressure 116/69, pulse (Abnormal) 45, temperature 99.3 F (37.4 C), temperature source Axillary, resp. rate 17, weight 62.9 kg, last menstrual period 04/20/2005, SpO2 96 %.    Vent Mode: PRVC FiO2 (%):  [30 %-50 %] 50 % Set Rate:  [16 bmp] 16 bmp Vt Set:  [430 mL] 430 mL PEEP:  [5 cmH20] 5 cmH20 Plateau Pressure:  [14 cmH20-20 cmH20] 17 cmH20   Intake/Output Summary (Last 24 hours) at 02/10/2021 0803 Last data filed at 02/10/2021 0700 Gross per 24 hour  Intake 1363.09 ml  Output 880 ml  Net 483.09 ml   Filed Weights   02/07/21 0530 02/10/21 0500  Weight: 66.1 kg 62.9 kg    Examination: General: 75 year old female patient currently sedated on propofol HEENT normocephalic/atraumatic left facial weakness on the intubated Pulmonary: Coarse scattered rhonchi and wheezing equal chest rise bilaterally Cardiac: Intermittently tachycardic, currently  she is normal sinus rhythm on telemetry/sinus tach however does go in and out of atrial fibrillation Extremities: Warm, dry, trace lower extremity edema Abdomen: Soft nontender GU: Foley catheter in place with clear yellow urine  Resolved Hospital Problem list   Septic  shock/circulatory shock  Medication related hypotension   Assessment & Plan:   Acute respiratory failure with hypoxia w/ aspiration PNA and bibasilar atx.  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  VC and NIF both downtrending.  Portable chest x-ray personally reviewed: Endotracheal tube and PICC line in satisfactory position.  Bilateral airspace disease progressive.  Cannot exclude element of emphysema. 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    Fluid & Electrolyte imbalance. Hyponatremia/SIADH , present on admission and acutely worse w/ NaCl infusion. Holding around 120. uOsmo would suggest SIADH but also IVIG can cause hyponatremia. Na holding 120s  Hypophosphatemia and Hypokalemia Plan Limit free water Lasix x1 (also seems volume overloaded) Replace and recheck lytes as needed  Hyperglycemia Plan Ssi for goal SBP 140-180  Leukocytosis, potentially due to IVIG vs pneumonia -afebrile. 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 Plan NPO Now has Cortrak; tubefeeds initiated    History of celiac disease Plan Gluten free tubefeeds.,   Afib, recent ablation on 01/28/21 Currently af w/ CVR on tele  Plan IV heparin (will hold for trach and then resume later this afternoon) Cont low dose lopressor    Glaucoma Plan Cont Timolol gtts    Best Practice (right click and "Reselect all SmartList Selections" daily)   Diet/type:  tubefeeds and NPO DVT prophylaxis: systemic heparin GI prophylaxis: PPI Lines: Central line and yes and it is still needed- PICC Foley:  Yes, and it is still needed Code Status:  full code Last date of multidisciplinary goals of care discussion [Goals discussed w patient 8/13, would want full scope of care]  My cct 32 min   Erick Colace ACNP-BC [place] Pager # [phone] OR # [phone] if no answer

## 2021-02-10 NOTE — Progress Notes (Signed)
Chaplain responded to call from nurse Carloyn Manner that patient's brother was in the room with her and he needed to see the [place]. Chaplain sat with the brother in the waiting room and listened to him talk about his sister and how they had been close all their lives. Brother said that they grew up in church but his sister had not been to church in over 20 years. He was concerned that God might not be in her life anymore. Chaplain assured him that God does not go anywhere and He is still there and she may still have a relationship with God. We went in to see patient and Chaplain prayed with both of them. Brother said he felt better. Chaplain told him that we are available 24/7 and he just needs to ask the nurse to call us.    02/10/21 0900  Clinical Encounter Type  Visited With Patient and family together  Visit Type Initial  Referral From Nurse  Consult/Referral To Chaplain  Spiritual Encounters  Spiritual Needs Prayer;Emotional  Stress Factors  Family Stress Factors Health changes

## 2021-02-11 DIAGNOSIS — G610 Guillain-Barre syndrome: Secondary | ICD-10-CM | POA: Diagnosis not present

## 2021-02-11 LAB — BASIC METABOLIC PANEL
Anion gap: 4 — ABNORMAL LOW (ref 5–15)
Anion gap: 7 (ref 5–15)
Anion gap: 7 (ref 5–15)
BUN: 13 mg/dL (ref 8–23)
BUN: 10 mg/dL (ref 8–23)
BUN: 14 mg/dL (ref 8–23)
CO2: 30 mmol/L (ref 22–32)
CO2: 28 mmol/L (ref 22–32)
CO2: 27 mmol/L (ref 22–32)
Calcium: 7.9 mg/dL — ABNORMAL LOW (ref 8.9–10.3)
Calcium: 8.1 mg/dL — ABNORMAL LOW (ref 8.9–10.3)
Calcium: 7.8 mg/dL — ABNORMAL LOW (ref 8.9–10.3)
Chloride: 92 mmol/L — ABNORMAL LOW (ref 98–111)
Chloride: 88 mmol/L — ABNORMAL LOW (ref 98–111)
Chloride: 92 mmol/L — ABNORMAL LOW (ref 98–111)
Creatinine, Ser: 0.41 mg/dL — ABNORMAL LOW (ref 0.44–1.00)
Creatinine, Ser: 0.47 mg/dL (ref 0.44–1.00)
Creatinine, Ser: 0.46 mg/dL (ref 0.44–1.00)
GFR, Estimated: >60 mL/min (ref >60)
GFR, Estimated: >60 mL/min (ref >60)
GFR, Estimated: >60 mL/min (ref >60)
Glucose, Bld: 120 mg/dL — ABNORMAL HIGH (ref 70–99)
Glucose, Bld: 159 mg/dL — ABNORMAL HIGH (ref 70–99)
Glucose, Bld: 162 mg/dL — ABNORMAL HIGH (ref 70–99)
Potassium: 3.7 mmol/L (ref 3.5–5.1)
Potassium: 4.3 mmol/L (ref 3.5–5.1)
Potassium: 3.5 mmol/L (ref 3.5–5.1)
Sodium: 126 mmol/L — ABNORMAL LOW (ref 135–145)
Sodium: 123 mmol/L — ABNORMAL LOW (ref 135–145)
Sodium: 126 mmol/L — ABNORMAL LOW (ref 135–145)

## 2021-02-11 LAB — CBC
HCT: 33.6 % — ABNORMAL LOW (ref 36.0–46.0)
Hemoglobin: 11.7 g/dL — ABNORMAL LOW (ref 12.0–15.0)
MCH: 34 pg (ref 26.0–34.0)
MCHC: 34.8 g/dL (ref 30.0–36.0)
MCV: 97.7 fL (ref 80.0–100.0)
Platelets: 135 10*3/uL — ABNORMAL LOW (ref 150–400)
RBC: 3.44 MIL/uL — ABNORMAL LOW (ref 3.87–5.11)
RDW: 13.2 % (ref 11.5–15.5)
WBC: 19.5 10*3/uL — ABNORMAL HIGH (ref 4.0–10.5)
nRBC: 0 % (ref 0.0–0.2)

## 2021-02-11 LAB — GLUCOSE, CAPILLARY
Glucose-Capillary: 124 mg/dL — ABNORMAL HIGH (ref 70–99)
Glucose-Capillary: 141 mg/dL — ABNORMAL HIGH (ref 70–99)
Glucose-Capillary: 165 mg/dL — ABNORMAL HIGH (ref 70–99)
Glucose-Capillary: 165 mg/dL — ABNORMAL HIGH (ref 70–99)
Glucose-Capillary: 161 mg/dL — ABNORMAL HIGH (ref 70–99)
Glucose-Capillary: 184 mg/dL — ABNORMAL HIGH (ref 70–99)
Glucose-Capillary: 162 mg/dL — ABNORMAL HIGH (ref 70–99)

## 2021-02-11 LAB — PHOSPHORUS: Phosphorus: 1.8 mg/dL — ABNORMAL LOW (ref 2.5–4.6)

## 2021-02-11 LAB — HEMOGLOBIN A1C
Hgb A1c MFr Bld: 5 % (ref 4.8–5.6)
Mean Plasma Glucose: 96.8 mg/dL

## 2021-02-11 LAB — HEPARIN LEVEL (UNFRACTIONATED): Heparin Unfractionated: 0.32 IU/mL (ref 0.30–0.70)

## 2021-02-11 LAB — MAGNESIUM: Magnesium: 1.8 mg/dL (ref 1.7–2.4)

## 2021-02-11 MED ORDER — NOREPINEPHRINE 4 MG/250ML-% IV SOLN
INTRAVENOUS | Status: AC
  Filled 2021-02-11: qty 250

## 2021-02-11 MED ORDER — POTASSIUM PHOSPHATES 15 MMOLE/5ML IV SOLN
30.0000 mmol | Freq: Once | INTRAVENOUS | Status: AC
  Administered 2021-02-11: 30 mmol via INTRAVENOUS
  Filled 2021-02-11: qty 10

## 2021-02-11 MED ORDER — MAGNESIUM SULFATE 2 GM/50ML IV SOLN
2.0000 g | Freq: Once | INTRAVENOUS | Status: AC
  Administered 2021-02-11: 2 g via INTRAVENOUS
  Filled 2021-02-11: qty 50

## 2021-02-11 MED ORDER — BLISTEX MEDICATED EX OINT
TOPICAL_OINTMENT | CUTANEOUS | Status: DC | PRN
  Filled 2021-02-11: qty 6.3

## 2021-02-11 MED ORDER — INSULIN ASPART 100 UNIT/ML IJ SOLN
0.0000 [IU] | INTRAMUSCULAR | Status: DC
  Administered 2021-02-11 – 2021-02-12 (×8): 3 [IU] via SUBCUTANEOUS
  Administered 2021-02-12 – 2021-02-15 (×12): 2 [IU] via SUBCUTANEOUS
  Administered 2021-02-15: 3 [IU] via SUBCUTANEOUS
  Administered 2021-02-15 – 2021-02-16 (×5): 2 [IU] via SUBCUTANEOUS
  Administered 2021-02-16 (×2): 3 [IU] via SUBCUTANEOUS
  Administered 2021-02-16 – 2021-02-17 (×2): 2 [IU] via SUBCUTANEOUS
  Administered 2021-02-17: 3 [IU] via SUBCUTANEOUS
  Administered 2021-02-17: 2 [IU] via SUBCUTANEOUS
  Administered 2021-02-18: 3 [IU] via SUBCUTANEOUS
  Administered 2021-02-18 – 2021-02-24 (×26): 2 [IU] via SUBCUTANEOUS
  Administered 2021-02-24: 3 [IU] via SUBCUTANEOUS
  Administered 2021-02-25 – 2021-03-04 (×13): 2 [IU] via SUBCUTANEOUS

## 2021-02-11 MED ORDER — FUROSEMIDE 10 MG/ML IJ SOLN
40.0000 mg | Freq: Once | INTRAMUSCULAR | Status: AC
  Administered 2021-02-11: 40 mg via INTRAVENOUS
  Filled 2021-02-11: qty 4

## 2021-02-11 NOTE — Progress Notes (Addendum)
Physical Therapy Treatment Patient Details Name: Angela Morales MRN: [id number] DOB: 01-10-1946 Today's Date: 02/11/2021    History of Present Illness Pt is a 75 y.o. female admitted 02/05/21 with c/o progressive BUE/BLE numbness and weakness; diagnosed with Guillain-Barr syndrome. Pt also with afib with RVR. Intubated 8/14 due to Acute respiratory failure with hypoxia w/ aspiration PNA and bibasilar atx.[facility] Kitchen MRI negative 8/15. Trach placed 8/16. CT brain 8/16 negative.  PMH includes afib (recent ablation 01/28/21), celiac disease, DDD, gluacoma, L TKA (2017).    PT Comments    Pt more alert this session after noxious stimulus applied. Pt demonstrated increased following of commands and increased time seated EOB. Pt's brother was present during session and educated on PROM of extremities and positioning. Continue to recommend CIR, as pt's deficits remain.     Follow Up Recommendations  CIR;Supervision for mobility/OOB     Equipment Recommendations  Other (comment)    Recommendations for Other Services       Precautions / Restrictions Precautions Precautions: Fall Precaution Comments: Incontinence Restrictions Weight Bearing Restrictions: No    Mobility  Bed Mobility Overal bed mobility: Needs Assistance Bed Mobility: Rolling;Sidelying to Sit;Sit to Sidelying Rolling: Total assist;+2 for physical assistance Sidelying to sit: +2 for physical assistance;Total assist     Sit to sidelying: Total assist;+2 for physical assistance General bed mobility comments: Performed rolling x5 with total A +2 physical assist for all trials. Total A +2 from sidelying to sit with pt intermittently following commands. Pt demonstrated head control in seated position.    Transfers                 General transfer comment: Unable to assess  Ambulation/Gait             General Gait Details: Unable to assess   Stairs             Wheelchair Mobility    Modified Rankin  (Stroke Patients Only)       Balance Overall balance assessment: Needs assistance Sitting-balance support: Feet supported Sitting balance-Leahy Scale: Zero Sitting balance - Comments: Requires external support to maintain balance. Sat EOB for a total of 9 minutes. Difficulty getting pt to sit with BUE support. Demonstrates good head control in seated position                                    Cognition Arousal/Alertness: Awake/alert Behavior During Therapy: Flat affect Overall Cognitive Status: Difficult to assess                                 General Comments: Pt initially asleep in bed, but became awake and alert once noxious stimulus applied. Pt intermittently follows one step commands with multimodal cues provided      Exercises General Exercises - Lower Extremity Long Arc Quad: PROM;Both;5 reps;Seated Hip ABduction/ADduction: PROM;Both;5 reps;Supine Hip Flexion/Marching: PROM;Both;5 reps;Supine Shoulder Exercises Shoulder Flexion: PROM;Both;5 reps;Supine Elbow Flexion: PROM;Both;5 reps;Supine Other Exercises Other Exercises: PROM DF x5 bilaterally in supine    General Comments General comments (skin integrity, edema, etc.): VSS - HR 108-111; PSV 50% PEEP 5      Pertinent Vitals/Pain Pain Assessment: Faces Faces Pain Scale: Hurts a little bit Pain Location: Back Pain Descriptors / Indicators: Grimacing Pain Intervention(s): Monitored during session;Limited activity within patient's tolerance;Repositioned    Home Living  Prior Function            PT Goals (current goals can now be found in the care plan section) Acute Rehab PT Goals Patient Stated Goal: none stated today    Frequency    Min 4X/week      PT Plan Current plan remains appropriate    Co-evaluation              AM-PAC PT "6 Clicks" Mobility   Outcome Measure  Help needed turning from your back to your side while in a  flat bed without using bedrails?: Total Help needed moving from lying on your back to sitting on the side of a flat bed without using bedrails?: Total Help needed moving to and from a bed to a chair (including a wheelchair)?: Total Help needed standing up from a chair using your arms (e.g., wheelchair or bedside chair)?: Total Help needed to walk in hospital room?: Total Help needed climbing 3-5 steps with a railing? : Total 6 Click Score: 6    End of Session   Activity Tolerance: Patient limited by fatigue;Treatment limited secondary to medical complications (Comment);Patient limited by pain Patient left: in bed;with bed alarm set;with call bell/phone within reach;with family/visitor present Nurse Communication: Mobility status;Need for lift equipment PT Visit Diagnosis: Other abnormalities of gait and mobility (R26.89);Muscle weakness (generalized) (M62.81);Other symptoms and signs involving the nervous system (R29.898);Hemiplegia and hemiparesis Hemiplegia - caused by: Unspecified     Time: 1550-1630 PT Time Calculation (min) (ACUTE ONLY): 40 min  Charges:  $Therapeutic Exercise: 8-22 mins $Therapeutic Activity: 8-22 mins $Neuromuscular Re-education: 8-22 mins                     Angela Morales, SPT Acute Rehab: [phone]      Angela Morales 02/11/2021, 5:30 PM

## 2021-02-11 NOTE — Progress Notes (Signed)
ANTICOAGULATION CONSULT NOTE  Pharmacy Consult for heparin Indication: atrial fibrillation  Allergies  Allergen Reactions   Gluten Meal Other (See Comments)    celiac disease   Cefdinir Rash    Patient Measurements: Weight: 63.1 kg (139 lb 1.8 oz) Heparin Dosing Weight: 64kg  Vital Signs: Temp: 98.7 F (37.1 C) (08/17 0830) Temp Source: Axillary (08/17 0830) BP: 158/95 (08/17 0815) Pulse Rate: 106 (08/17 0830)  Labs: Recent Labs    02/09/21 0520 02/09/21 0932 02/10/21 0500 02/10/21 0825 02/10/21 1157 02/10/21 2015 02/11/21 0420  HGB 12.1  --   --  11.7*  --   --  11.7*  HCT 33.4*  --   --  34.1*  --   --  33.6*  PLT 166  --   --  155  --   --  135*  HEPARINUNFRC 0.58  --  0.52  --   --   --  0.32  CREATININE 0.57   < >  --  0.45 0.51 0.53 0.41*   < > = values in this interval not displayed.     Estimated Creatinine Clearance: 52.5 mL/min (A) (by C-G formula based on SCr of 0.41 mg/dL (L)).   Medical History: Past Medical History:  Diagnosis Date   Abnormal uterine bleeding    on HRT   Allergy    seasonal   Arthritis    Atrial fibrillation (HCC)    Celiac disease    DDD (degenerative disc disease)    Dysrhythmia    Glaucoma    Microscopic colitis    Osteoarthritis of both knees    PONV (postoperative nausea and vomiting)    SVT (supraventricular tachycardia) (HCC)     Medications:  Facility-Administered Medications Prior to Admission  Medication Dose Route Frequency Provider Last Rate Last Admin   0.9 %  sodium chloride infusion  500 mL Intravenous Continuous [name], Estill Cotta III, MD       Medications Prior to Admission  Medication Sig Dispense Refill Last Dose   apixaban (ELIQUIS) 5 MG TABS tablet TAKE 1 TABLET BY MOUTH TWICE A DAY 180 tablet 1 02/05/2021 at 0830   gabapentin (NEURONTIN) 300 MG capsule Take 300 mg by mouth at bedtime.   02/04/2021   ibuprofen (ADVIL,MOTRIN) 200 MG tablet Take 400 mg by mouth every 6 (six) hours as needed for  moderate pain.   Past Week   loratadine (CLARITIN) 10 MG tablet Take 10 mg by mouth daily as needed for allergies.   PRN   metoprolol succinate (TOPROL-XL) 25 MG 24 hr tablet TAKE 1 TABLET BY MOUTH EVERY DAY 90 tablet 3 02/05/2021 at 0830   Polyethyl Glycol-Propyl Glycol (SYSTANE) 0.4-0.3 % SOLN Place 1-2 drops into both eyes 3 (three) times daily as needed (dry eyes).   Past Week   psyllium (METAMUCIL) 58.6 % packet Take 1 packet by mouth in the morning.   02/05/2021   timolol (TIMOPTIC) 0.5 % ophthalmic solution Place 1 drop into the left eye 2 (two) times daily.   02/05/2021   ZIOPTAN 0.0015 % SOLN Place 1 drop into the left eye at bedtime.   02/04/2021    Assessment: 75 year old female with history of afib on apixaban prior to admit. 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 Last dose of apixaban appears to be prior to admission on 8/11 at 0830. Patient back in afib this evening, will bridge with heparin while apixaban washes out.   Heparin level  came back therapeutic at 0.32, on 900 units/hr. Hgb 11.7, plt 135. No s/sx of bleeding or infusion issues noted.  Goal of Therapy:  Heparin level 0.3-0.7 units/ml Monitor platelets by anticoagulation protocol: Yes   Plan:  Continue heparin at 900 units/hr Check HL daily Monitor H&H and platelets   Antonietta Jewel, PharmD, [facility] Clinical Pharmacist  Phone: [phone] 02/11/2021 9:55 AM  Please check AMION for all [place] phone numbers After 10:00 PM, call [place] [phone]

## 2021-02-11 NOTE — Progress Notes (Signed)
Occupational Therapy Treatment Patient Details Name: Angela Morales MRN: [id number] DOB: 1945/07/28 Today's Date: 02/11/2021    History of present illness Pt is a 75 y.o. female admitted 02/05/21 with c/o progressive BUE/BLE numbness and weakness; diagnosed with Guillain-Barr syndrome. Pt also with afib with RVR. Intubated 8/14 due to Acute respiratory failure with hypoxia w/ aspiration PNA and bibasilar atx.[facility] MRI negative 8/15. Trach placed 8/16. CT brain 8/16 negative.  PMH includes afib (recent ablation 01/28/21), celiac disease, DDD, gluacoma, L TKA (2017).   OT comments  Patient supine in bed with RN present upon entry.  Patient upright in bed and initially opens R eye to command, but the unable to follow further commands during session.  Lethargic and keeping eyes closed. Provided with PROM to BUEs in supine, noted withdrawal to noxious stimuli to BUEs but delayed response. Total +2 to reposition in bed with RN assist.  Remains total for all self care.  VSS during session.  May be appropriate for tilt bed. Will follow acutely.   BP pre: 121/74, BP post: 134/75 PSV 40% FiO2 PEEP 5 HR 105-110, SpO2 96%, RR 23    Follow Up Recommendations  CIR;Supervision/Assistance - 24 hour    Equipment Recommendations  Other (comment) (TBD)    Recommendations for Other Services Rehab consult    Precautions / Restrictions Precautions Precautions: Fall Precaution Comments: Incontinence Restrictions Weight Bearing Restrictions: No       Mobility Bed Mobility Overal bed mobility: Needs Assistance Bed Mobility: Rolling Rolling: Total assist;+2 for safety/equipment;+2 for physical assistance         General bed mobility comments: assisted RN with repositioning in bed with total assist +2    Transfers                      Balance       Sitting balance - Comments: unable                                   ADL either performed or assessed with clinical  judgement   ADL Overall ADL's : Needs assistance/impaired                                       General ADL Comments: total assist     Vision   Additional Comments: pt opening R eye only, continue assessment   Perception     Praxis      Cognition Arousal/Alertness: Lethargic;Suspect due to medications Behavior During Therapy: Flat affect Overall Cognitive Status: Difficult to assess                                 General Comments: pt initally following command to open eyes, fatigues quickly during session and not following commands; RN reports may be medication related (reports very alert this am)        Exercises Exercises: Other exercises Other Exercises Other Exercises: PROM to BUEs x 5 reps in all planes, repositioned on pillows for comfort and elevation   Shoulder Instructions       General Comments PSV 40% FiO2 PEEP 5    Pertinent Vitals/ Pain       Pain Assessment: Faces Faces Pain Scale: Hurts a little bit Pain Location: R shoulder with PROM  Pain Descriptors / Indicators: Grimacing Pain Intervention(s): Limited activity within patient's tolerance;Monitored during session;Repositioned  Home Living                                          Prior Functioning/Environment              Frequency  Min 2X/week        Progress Toward Goals  OT Goals(current goals can now be found in the care plan section)  Progress towards OT goals: Not progressing toward goals - comment (lethargy/decreased responsiveness)  Acute Rehab OT Goals Patient Stated Goal: none stated today OT Goal Formulation: Patient unable to participate in goal setting  Plan Discharge plan remains appropriate;Frequency remains appropriate    Co-evaluation                 AM-PAC OT "6 Clicks" Daily Activity     Outcome Measure   Help from another person eating meals?: Total Help from another person taking care of personal  grooming?: Total Help from another person toileting, which includes using toliet, bedpan, or urinal?: Total Help from another person bathing (including washing, rinsing, drying)?: Total Help from another person to put on and taking off regular upper body clothing?: Total Help from another person to put on and taking off regular lower body clothing?: Total 6 Click Score: 6    End of Session    OT Visit Diagnosis: Other abnormalities of gait and mobility (R26.89);Muscle weakness (generalized) (M62.81);Pain;Other symptoms and signs involving cognitive function;Other symptoms and signs involving the nervous system (R29.898) Pain - part of body:  (R shoulder)   Activity Tolerance Patient limited by lethargy   Patient Left in bed;with call bell/phone within reach;with bed alarm set;with nursing/sitter in room   Nurse Communication Mobility status        Time: 1610-9604 OT Time Calculation (min): 15 min  Charges: OT General Charges $OT Visit: 1 Visit OT Treatments $Therapeutic Activity: 8-22 mins  Jolaine Artist, OT [facility] Pager [phone] Office [phone]    Delight Stare 02/11/2021, 2:07 PM

## 2021-02-11 NOTE — Progress Notes (Signed)
NAME:  Angela Morales, MRN:  [id number], DOB:  08-Jun-1946, LOS: 5 ADMISSION DATE:  02/05/2021, CONSULTATION DATE:  02/07/21  REFERRING MD:  Dr Lupita Leash, CHIEF COMPLAINT:  tachypnea, hypoxemia   History of Present Illness:  75 year old woman with a history of atrial fibrillation (ablated 8/3), celiac disease, DDD who was admitted on 8/11 with bilateral upper and lower extremity weakness and paresthesias beginning about 8/8.  Developed some ascending weakness that resulted in inability to ambulate.  No other prodrome.  No clear precipitants although she was started on gabapentin about 1 month ago.  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  LP deferred initially as she was on Eliquis last dose 8/11.  IVIG started 8/12.  She has had progressive bilateral upper extremity weakness, some bulbar dysfunction difficulty swallowing, globus sensation with perception of swallowing and voice weakness.  FVC has been consistently greater than 1.0 L, NIF <-25.  She had an episode of desaturation, increased work of breathing and choking when eating Jell-O and pudding evening of 8/13.  Pertinent  Medical History   Past Medical History:  Diagnosis Date   Abnormal uterine bleeding    on HRT   Allergy    seasonal   Arthritis    Atrial fibrillation (HCC)    Celiac disease    DDD (degenerative disc disease)    Dysrhythmia    Glaucoma    Microscopic colitis    Osteoarthritis of both knees    PONV (postoperative nausea and vomiting)    SVT (supraventricular tachycardia) ([place])      Significant Hospital Events: Including procedures, antibiotic start and stop dates in addition to other pertinent events   IVIg started 8/12 CT CAP >> no dissection, bibasilar atelectasis, distended gallbladder with some pericholecystic stranding, segmental colitis question related to diverticular disease RUQ ultrasound 8/12 >> distended gallbladder without stones or pericholecystic fluid no evidence  cholecystitis MR cervical, thoracic, lumbar spine 8/12 > normal spinal cord no imaging evidence of demyelination or Guillain-Barr.  Advanced lumbar DJD, cervical DJD 8/13 Zosyn started for possible aspiration pneumonia 8/14: Intubated for progressive respiratory failure, poor bulbar function, and downtrending NIF and vital capacity sodium down to 115 from 124 but corrected to 119 with hyperglycemia.  115 was treated with hypertonic saline bolus.   8/15: IV sedating drips stopped, placed back on propofol in the evening hours.  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  Free water limited. 8/16: day 5 of 5 IVIG.  Remains profoundly weak.  Tracheostomy,  Getting IV Lasix for volume overload. 8/17: Remains in ICU  Interim History / Subjective:   Sedated on mechanical vent support.,  Tracheostomy tube in place.  Good urine output.  Objective   Blood pressure (!) 158/95, pulse (!) 106, temperature 98.7 F (37.1 C), temperature source Axillary, resp. rate (!) 23, weight 63.1 kg, last menstrual period 04/20/2005, SpO2 94 %.    Vent Mode: PSV;CPAP FiO2 (%):  [40 %-60 %] 40 % Set Rate:  [16 bmp] 16 bmp Vt Set:  [430 mL] 430 mL PEEP:  [5 cmH20] 5 cmH20 Pressure Support:  [10 cmH20] 10 cmH20 Plateau Pressure:  [14 cmH20-17 cmH20] 17 cmH20   Intake/Output Summary (Last 24 hours) at 02/11/2021 0856 Last data filed at 02/11/2021 0830 Gross per 24 hour  Intake 1430.26 ml  Output 4145 ml  Net -2714.74 ml   Filed Weights   02/07/21 0530 02/10/21 0500 02/11/21 0600  Weight: 66.1 kg 62.9 kg 63.1 kg    Examination:  General: 75 year old female on mechanical life support HEENT tracheostomy tube in place, clean dry and intact Pulmonary: Bilateral coarse rhonchi and bilateral ventilated breath sounds Cardiac: Regular rate rhythm, S1-S2 Extremities: Bilateral trace lower extremity edema Abdomen: Soft, nontender nondistended GU: Foley in place  Resolved Hospital Problem list   Septic  shock/circulatory shock  Medication related hypotension   Assessment & Plan:   Acute respiratory failure with hypoxia w/ aspiration PNA and bibasilar atx.  S/p Tracheostomy 8/16 Intubated due to both respiratory muscle weakness and bulbar dysfunction from presumed Guillain-Barr syndrome.  VC and NIF both downtrending.  Portable chest x-ray personally reviewed: Endotracheal tube and PICC line in satisfactory position.  Bilateral airspace disease progressive.  Cannot exclude element of emphysema. 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 Hyponatremia/SIADH , present on admission and acutely worse w/ NaCl infusion. Holding around 120. uOsmo would suggest SIADH but also IVIG can cause hyponatremia. Na holding 120s  Hypophosphatemia and Hypokalemia Plan Replace as needed  Follow lytes closely  Follow uop   Hyperglycemia Plan SSI with goal 140-180 cbgs   Leukocytosis, potentially due to IVIG vs pneumonia -afebrile. 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 Plan NPO TF with cortrak  History of celiac disease  Afib, recent ablation on 01/28/21 Currently af w/ CVR on tele  Plan IV heparin Appreciate pharmacy input   Glaucoma Plan Continue timolol   Best Practice (right click and "Reselect all SmartList Selections" daily)   Diet/type: tubefeeds and NPO DVT prophylaxis: systemic heparin GI prophylaxis: PPI Lines: Central line and yes and it is still needed- PICC Foley:  Yes, and it is still needed Code Status:  full code Last date of multidisciplinary goals of care discussion [Goals discussed w patient 8/13, would want full scope of care]  This patient is critically  ill with multiple organ system failure; which, requires frequent high complexity decision making, assessment, support, evaluation, and titration of therapies. This was completed through the application of advanced monitoring technologies and extensive interpretation of multiple databases. During this encounter critical care time was devoted to patient care services described in this note for 32 minutes.  Garner Nash, DO [facility] 02/11/2021 8:56 AM

## 2021-02-11 NOTE — Evaluation (Signed)
[facility] - Evaluation Patient Details  Name: Angela Morales MRN: [id number] Date of Birth: Mar 26, 1946  Today's Date: 02/11/2021 Time: 4010-2725 SLP Time Calculation (min) (ACUTE ONLY): 20 min  Past Medical History:  Past Medical History:  Diagnosis Date   Abnormal uterine bleeding    on HRT   Allergy    seasonal   Arthritis    Atrial fibrillation (HCC)    Celiac disease    DDD (degenerative disc disease)    Dysrhythmia    Glaucoma    Microscopic colitis    Osteoarthritis of both knees    PONV (postoperative nausea and vomiting)    SVT (supraventricular tachycardia) ([place])    Past Surgical History:  Past Surgical History:  Procedure Laterality Date   APPENDECTOMY     ATRIAL FIBRILLATION ABLATION N/A 01/28/2021   Procedure: ATRIAL FIBRILLATION ABLATION;  Surgeon: Constance Haw, MD;  Location: [facility];  Service: Cardiovascular;  Laterality: N/A;   CARDIOVERSION N/A 10/30/2020   Procedure: CARDIOVERSION;  Surgeon: Jerline Pain, MD;  Location: [facility];  Service: Cardiovascular;  Laterality: N/A;   COLONOSCOPY     FOOT FRACTURE SURGERY  10/2013   TONSILLECTOMY AND ADENOIDECTOMY     TOTAL KNEE ARTHROPLASTY Left 03/22/2016   Procedure: TOTAL KNEE ARTHROPLASTY;  Surgeon: Dorna Leitz, MD;  Location: [facility];  Service: Orthopedics;  Laterality: Left;   TUBAL LIGATION  1981   VAGINAL HYSTERECTOMY  04/20/05   prolapse, adenomyosis   HPI:  Angela Morales is a 75 y.o. female who presented with complaint of weakness and numbness in arms and legs. Concern for GBS, s/p IVIG completed 8/16. Initial BSE 8/13 with recommendation for full liquid diet. GI consult that day with concern for more neurological dysphagia vs esophageal. Later that day pt had a suspected aspiration event with jello. She was intubated 8/13 and trached 8/16. PMH significant for atrial fibrillation (on anticoagulation, recently s/p ablation on 8/3 after failed cardioversion on  10/30/2020), celiac disease, degenerative disc disease.   Assessment / Plan / Recommendation Clinical Impression  Pt was lethargic for PMV trial this morning, although per RN was more alert and attempting to communicate earlier in the day. Pt opened her eyes to stimulation x1. RT present and adjusted vent from PSV to [facility] before gradually deflating the cuff. Audible secretions noted, which pt could not clear on her own. They were deep enough that they could not be reached with yankauer, but RT was able to retrieve them with use of oral catheter. PMV was placed with reduction in PEEP (5 to 0 cm H20). Although pt showed no signs of intolerance, she was not able to produce any phonation. When cued to do so, she would sometimes shake her head "no" in response. PMV was doffed, cuff was reinflated, and vent was restored to original settings. Education was provided to brother present about plan for ongoing trials, hopefully with more attempts at phonation once she is more alert. Would use PMV only with SLP and RT at this time. SLP Visit Diagnosis: Aphonia (R49.1)    SLP Assessment  Patient needs continued Speech Lanaguage Pathology Services    Follow Up Recommendations   (tba)    Frequency and Duration min 2x/week  2 weeks    PMSV Trial PMSV was placed for: <5 minutes Able to redirect subglottic air through upper airway: Yes Able to Attain Phonation: No attempt to phonate Able to Expectorate Secretions: No Breath Support for Phonation: Inadequate Intelligibility: Unable  to assess (comment) Respirations During Trial: 30 SpO2 During Trial: 94 % Pulse During Trial: 108 Behavior: Lethargic   Tracheostomy Tube       Vent Dependency  Vent Mode: PSV;CPAP PEEP: 5 cmH20 Pressure Support: 10 cmH20 FiO2 (%): 40 %    Cuff Deflation Trial  GO Tolerated Cuff Deflation: Yes Length of Time for Cuff Deflation Trial: ~5 min Behavior: Other (comment) (lethargic)         Osie Bond., M.A.  [facility] Pager [phone] Office [phone]  02/11/2021, 12:25 PM

## 2021-02-11 NOTE — Progress Notes (Signed)
[facility] Progress Note Patient Name: Angela Morales DOB: [birth year] MRN: [id number]   Date of Service  02/11/2021  HPI/Events of Note  K+ 3.7, PO4 1.8  eICU Interventions  K+ and PO4 replaced per E-Link adult electrolyte replacement protocol orders.        Frederik Pear 02/11/2021, 6:34 AM

## 2021-02-11 NOTE — Progress Notes (Signed)
Subjective: She had an episode of decreased responsiveness that resolved on it's own yesterday, CT was negative.   Exam: Vitals:   02/11/21 0815 02/11/21 0830  BP: (!) 158/95   Pulse: 94 (!) 106  Resp: (!) 23 (!) 23  Temp:  98.7 F (37.1 C)  SpO2: 94%    Gen: In bed, NAD Resp: non-labored breathing, no acute distress Abd: soft, nt  Neuro: MS: awakens to gentle stimulation, answers questions with nods/shakes.  CN:she has limited EOM bilaterally, with minimal movements in all directions.  Motor: She has severe weakness of the LE with 1/5 strength at the left knee, no movemetn at the ankles, length dependant UE weakness as well with little dorsiflexion at the wrist, some fingerextension on the right with absent grip. She has 1/5 grip on the left.  I think this is slightly worse than yesterday.  Sensory:she endorses diminished sensation in bilateral legs.  HTD:SKAJGO.   Pertinent Labs: Na 126  Impression: 75 yo F with acute ascending paralysis/sensory change with areflexia consistent with GBS. With cranial nerve involvement, this is most consistent with AIDP, but other GBS variants are also possible. Certainly with the severity of her symptoms, she likely has a long road ahead of her in terms of recovery. There has not been any strong evidence to support retreatment after an initial IVIG course, and more recently two studies have suggested no benefit of additional IVIG in non-responders, but the actual ideal dose of IVIG has not been clearly identifed. A study comparing 2.4 g/kg to 1.2 g/kg found benefit of the higher dose, and therefore if she is continuing to worsen, may consider early additional IVIG 1g/kg.   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 No significant autonomic instability thus far.   Recommendations: 1) IVIG completed 8/16 for 2g/kg 2) Na management per CCM 3) will continue to follow.    Roland Rack, MD [facility] [phone]  If 7pm- 7am, please  page neurology on call as listed in [place].

## 2021-02-11 NOTE — Progress Notes (Signed)
RT and RN present to assist speech therapy with an aline PMV. Pt tolerated her cuff deflated,however didn't make any efforts to communicate. Cuff was reinflated. Pt is stable at this time.

## 2021-02-12 DIAGNOSIS — G610 Guillain-Barre syndrome: Secondary | ICD-10-CM | POA: Diagnosis not present

## 2021-02-12 LAB — CBC
HCT: 34.7 % — ABNORMAL LOW (ref 36.0–46.0)
Hemoglobin: 12.1 g/dL (ref 12.0–15.0)
MCH: 33.6 pg (ref 26.0–34.0)
MCHC: 34.9 g/dL (ref 30.0–36.0)
MCV: 96.4 fL (ref 80.0–100.0)
Platelets: 161 10*3/uL (ref 150–400)
RBC: 3.6 MIL/uL — ABNORMAL LOW (ref 3.87–5.11)
RDW: 13 % (ref 11.5–15.5)
WBC: 14.8 10*3/uL — ABNORMAL HIGH (ref 4.0–10.5)
nRBC: 0 % (ref 0.0–0.2)

## 2021-02-12 LAB — BASIC METABOLIC PANEL
Anion gap: 10 (ref 5–15)
Anion gap: 13 (ref 5–15)
Anion gap: 4 — ABNORMAL LOW (ref 5–15)
BUN: 15 mg/dL (ref 8–23)
BUN: 14 mg/dL (ref 8–23)
BUN: 14 mg/dL (ref 8–23)
CO2: 27 mmol/L (ref 22–32)
CO2: 25 mmol/L (ref 22–32)
CO2: 25 mmol/L (ref 22–32)
Calcium: 8.4 mg/dL — ABNORMAL LOW (ref 8.9–10.3)
Calcium: 7.7 mg/dL — ABNORMAL LOW (ref 8.9–10.3)
Calcium: 7.6 mg/dL — ABNORMAL LOW (ref 8.9–10.3)
Chloride: 91 mmol/L — ABNORMAL LOW (ref 98–111)
Chloride: 90 mmol/L — ABNORMAL LOW (ref 98–111)
Chloride: 98 mmol/L (ref 98–111)
Creatinine, Ser: 0.42 mg/dL — ABNORMAL LOW (ref 0.44–1.00)
Creatinine, Ser: 0.42 mg/dL — ABNORMAL LOW (ref 0.44–1.00)
Creatinine, Ser: 0.35 mg/dL — ABNORMAL LOW (ref 0.44–1.00)
GFR, Estimated: >60 mL/min (ref >60)
GFR, Estimated: >60 mL/min (ref >60)
GFR, Estimated: >60 mL/min (ref >60)
Glucose, Bld: 149 mg/dL — ABNORMAL HIGH (ref 70–99)
Glucose, Bld: 332 mg/dL — ABNORMAL HIGH (ref 70–99)
Glucose, Bld: 139 mg/dL — ABNORMAL HIGH (ref 70–99)
Potassium: 3.4 mmol/L — ABNORMAL LOW (ref 3.5–5.1)
Potassium: 4 mmol/L (ref 3.5–5.1)
Potassium: 4.1 mmol/L (ref 3.5–5.1)
Sodium: 128 mmol/L — ABNORMAL LOW (ref 135–145)
Sodium: 128 mmol/L — ABNORMAL LOW (ref 135–145)
Sodium: 127 mmol/L — ABNORMAL LOW (ref 135–145)

## 2021-02-12 LAB — GLUCOSE, CAPILLARY
Glucose-Capillary: 148 mg/dL — ABNORMAL HIGH (ref 70–99)
Glucose-Capillary: 174 mg/dL — ABNORMAL HIGH (ref 70–99)
Glucose-Capillary: 157 mg/dL — ABNORMAL HIGH (ref 70–99)
Glucose-Capillary: 171 mg/dL — ABNORMAL HIGH (ref 70–99)
Glucose-Capillary: 151 mg/dL — ABNORMAL HIGH (ref 70–99)
Glucose-Capillary: 173 mg/dL — ABNORMAL HIGH (ref 70–99)

## 2021-02-12 LAB — CULTURE, RESPIRATORY W GRAM STAIN: Culture: NORMAL

## 2021-02-12 LAB — PHOSPHORUS: Phosphorus: 1.5 mg/dL — ABNORMAL LOW (ref 2.5–4.6)

## 2021-02-12 LAB — HEPARIN LEVEL (UNFRACTIONATED): Heparin Unfractionated: 0.32 IU/mL (ref 0.30–0.70)

## 2021-02-12 LAB — MAGNESIUM: Magnesium: 1.9 mg/dL (ref 1.7–2.4)

## 2021-02-12 MED ORDER — FENTANYL CITRATE (PF) 100 MCG/2ML IJ SOLN
12.5000 ug | Freq: Once | INTRAMUSCULAR | Status: DC
  Filled 2021-02-12: qty 2

## 2021-02-12 MED ORDER — METOPROLOL TARTRATE 25 MG/10 ML ORAL SUSPENSION
50.0000 mg | Freq: Three times a day (TID) | ORAL | Status: DC
  Administered 2021-02-12: 50 mg
  Filled 2021-02-12: qty 20

## 2021-02-12 MED ORDER — POTASSIUM CHLORIDE 20 MEQ PO PACK: 40.0000 meq | PACK | Freq: Two times a day (BID) | ORAL | Status: DC

## 2021-02-12 MED ORDER — METOPROLOL TARTRATE 25 MG/10 ML ORAL SUSPENSION
75.0000 mg | Freq: Three times a day (TID) | ORAL | Status: DC
  Administered 2021-02-12 – 2021-02-14 (×2): 75 mg
  Filled 2021-02-12 (×6): qty 30

## 2021-02-12 MED ORDER — POTASSIUM CHLORIDE 20 MEQ PO PACK
40.0000 meq | PACK | Freq: Once | ORAL | Status: AC
  Administered 2021-02-12: 40 meq
  Filled 2021-02-12: qty 2

## 2021-02-12 MED ORDER — MAGNESIUM SULFATE 2 GM/50ML IV SOLN
2.0000 g | Freq: Once | INTRAVENOUS | Status: AC
  Administered 2021-02-12: 2 g via INTRAVENOUS
  Filled 2021-02-12: qty 50

## 2021-02-12 MED ORDER — SODIUM PHOSPHATES 45 MMOLE/15ML IV SOLN
20.0000 mmol | Freq: Once | INTRAVENOUS | Status: AC
  Administered 2021-02-12: 20 mmol via INTRAVENOUS
  Filled 2021-02-12: qty 6.67

## 2021-02-12 MED ORDER — DILTIAZEM 12 MG/ML ORAL SUSPENSION
30.0000 mg | Freq: Four times a day (QID) | ORAL | Status: DC
  Administered 2021-02-12 – 2021-02-13 (×2): 30 mg
  Filled 2021-02-12 (×5): qty 3

## 2021-02-12 MED ORDER — CLONAZEPAM 0.125 MG PO TBDP
0.2500 mg | ORAL_TABLET | Freq: Two times a day (BID) | ORAL | Status: DC
  Administered 2021-02-12 (×2): 0.25 mg
  Filled 2021-02-12 (×2): qty 2

## 2021-02-12 MED ORDER — IMMUNE GLOBULIN (HUMAN) 10 GM/100ML IV SOLN
0.5000 g/kg | INTRAVENOUS | Status: DC
  Administered 2021-02-12: 30 g via INTRAVENOUS
  Filled 2021-02-12: qty 200
  Filled 2021-02-12: qty 300

## 2021-02-12 MED ORDER — OXYCODONE HCL 5 MG PO TABS
5.0000 mg | ORAL_TABLET | ORAL | Status: DC | PRN
  Administered 2021-02-14 (×2): 5 mg via ORAL
  Filled 2021-02-12 (×3): qty 1

## 2021-02-12 NOTE — Progress Notes (Signed)
Neurology Progress Note  Brief HPI: 75 y.o. 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   Interval Major Events: Continual rapidly progressive weakness after admission with continued hyponatremia.  8/14: Intubated  8/16: IVIG day 5/5 complete, tracheostomy placed. CTH negative after brief transient episode of decreased responsiveness   Subjective: No acute overnight events  Exam: Vitals:   02/12/21 0600 02/12/21 0630  BP: (!) 145/99 (!) 159/110  Pulse: (!) 112 (!) 117  Resp: (!) 28 (!) 22  Temp:    SpO2: 98% 99%   Gen: Critically ill appearing female, laying in ICU bed, in no acute distress Resp: tracheostomy at midline neck, respirations supported by mechanical ventilator, spontaneous respirations present Abd: soft, non-distended, non-tender  Neuro: Mental Status: Eyes closed, arouses with minimal stimuli, husband at bedside this morning.  Briefly opens eyes to voice with right eye opening > left eye.  Answers yes/no questions appropriately with nods/shakes.  Cranial Nerves: Limited EOM bilaterally with minimal eye movements in all directions, facial sensation is intact and symmetric to light touch, face is symmetric with minimal movement, hearing is intact to voice. Motor:      Right  Left Shoulder Abduction  0/5  0/5 Elbow Flexion   2/5  2/5 Elbow Extension  2/5  2/5 Wrist Flexion   0/5  0/5 Wrist Extension  1/5  1/5 Finger Extension  1/5  1/5  Finger Flexion   1/5  2/5  Hip Flexion   0/5  1/5 Knee Flexion   0/5  0/5 Knee Extension  0/5  0/5 Ankle Dorsiflexion  0/5  0/5 Ankle  Plantarflexion  0/5  0/5 Minimal toe movement bilaterally on command L>R Gives a "thumbs up" on the right to command. Sensory: Sensation to light touch in bilateral lower extremities is decreased with reports of left >right decreased sensation. DTR: Absent Gait: Deferred  Pertinent Labs: CBC    Component Value Date/Time   WBC 14.8 (H) 02/12/2021 0338   RBC 3.60 (L) 02/12/2021 0338   HGB 12.1 02/12/2021 0338   HGB 16.0 (H) 01/22/2021 1138   HCT 34.7 (L) 02/12/2021 0338   HCT 48.2 (H) 01/22/2021 1138   PLT 161 02/12/2021 0338   PLT 210 01/22/2021 1138   MCV 96.4 02/12/2021 0338   MCV 101 (H) 01/22/2021 1138   MCH 33.6 02/12/2021 0338   MCHC 34.9 02/12/2021 0338   RDW 13.0 02/12/2021 0338   RDW 12.0 01/22/2021 1138   LYMPHSABS 1.5 02/05/2021 2251   MONOABS 0.5 02/05/2021 2251   EOSABS 0.0 02/05/2021 2251   BASOSABS 0.0 02/05/2021 2251   CMP     Component Value Date/Time   NA 128 (L) 02/12/2021 0338   NA 137 01/22/2021 1138   K 3.4 (L) 02/12/2021 0338   CL 91 (L) 02/12/2021 0338   CO2 27 02/12/2021 0338   GLUCOSE 149 (H) 02/12/2021 0338   BUN 15 02/12/2021 0338   BUN 8 01/22/2021 1138   CREATININE 0.42 (L) 02/12/2021 0338   CALCIUM 8.4 (L) 02/12/2021 0338   PROT 6.8 02/07/2021 0508   ALBUMIN 2.9 (L) 02/07/2021 0508   AST 23 02/07/2021 0508   ALT 12  02/07/2021 0508   ALKPHOS 49 02/07/2021 0508   BILITOT 1.0 02/07/2021 0508   GFRNONAA >60 02/12/2021 0338   GFRAA >60 03/23/2016 0315   Imaging Reviewed: No new imaging  [facility] 02/10/2021: - No evidence of acute intracranial abnormality. - Mild chronic small vessel ischemic changes within the cerebral white matter.  Impression: 74 yo F with acute ascending paralysis/sensory change with areflexia consistent with GBS. With cranial nerve involvement, this is most consistent with AIDP, but other GBS variants are also possible. Certainly with the severity of her symptoms, she likely has a long road ahead of her in terms of  recovery. There has not been any strong evidence to support retreatment after an initial IVIG course, and more recently two studies have suggested no benefit of additional IVIG in non-responders, but the actual ideal dose of IVIG has not been clearly identifed. A study comparing 2.4 g/kg to 1.2 g/kg found benefit of the higher dose, and therefore if she is continuing to worsen, may consider early additional IVIG.   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 No significant autonomic instability thus far.   Recommendations: - Additional IVIG 0.5 g/kg/day once - Continue NA management per primary team - Neurology will continue to follow  Anibal Henderson, AGACNP-BC [facility] [phone]  I have seen the patient and reviewed the above note.  There have not been a lot of dose finding studies with IVIG and the exact ideal dose has not been well-established.  There was a study that compared 2.4 g/kg of IVIG versus 1.2 g/kg and found the larger dose to be superior.  Given her severe symptoms, I would favor giving an additional 500 mg/kg.  She does appear to be slightly better than yesterday, so I am hopeful that she has had her nadir.  I suspect that she will have a long convalescence.  Roland Rack, MD [facility] [phone]  If 7pm- 7am, please page neurology on call as listed in [place].

## 2021-02-12 NOTE — Progress Notes (Addendum)
NAME:  Angela Morales, MRN:  [id number], DOB:  1946-04-24, LOS: 6 ADMISSION DATE:  02/05/2021, CONSULTATION DATE:  02/07/21  REFERRING MD:  Dr Lupita Leash, CHIEF COMPLAINT:  tachypnea, hypoxemia   History of Present Illness:  75 year old woman with a history of atrial fibrillation (ablated 8/3), celiac disease, DDD who was admitted on 8/11 with bilateral upper and lower extremity weakness and paresthesias beginning about 8/8.  Developed some ascending weakness that resulted in inability to ambulate.  No other prodrome.  No clear precipitants although she was started on gabapentin about 1 month ago.  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  LP deferred initially as she was on Eliquis last dose 8/11.  IVIG started 8/12.  She has had progressive bilateral upper extremity weakness, some bulbar dysfunction difficulty swallowing, globus sensation with perception of swallowing and voice weakness.  FVC has been consistently greater than 1.0 L, NIF <-25.  She had an episode of desaturation, increased work of breathing and choking when eating Jell-O and pudding evening of 8/13.  Pertinent  Medical History   Past Medical History:  Diagnosis Date   Abnormal uterine bleeding    on HRT   Allergy    seasonal   Arthritis    Atrial fibrillation (HCC)    Celiac disease    DDD (degenerative disc disease)    Dysrhythmia    Glaucoma    Microscopic colitis    Osteoarthritis of both knees    PONV (postoperative nausea and vomiting)    SVT (supraventricular tachycardia) ([place])      Significant Hospital Events: Including procedures, antibiotic start and stop dates in addition to other pertinent events   IVIg started 8/12 CT CAP >> no dissection, bibasilar atelectasis, distended gallbladder with some pericholecystic stranding, segmental colitis question related to diverticular disease RUQ ultrasound 8/12 >> distended gallbladder without stones or pericholecystic fluid no evidence  cholecystitis MR cervical, thoracic, lumbar spine 8/12 > normal spinal cord no imaging evidence of demyelination or Guillain-Barr.  Advanced lumbar DJD, cervical DJD 8/13 Zosyn started for possible aspiration pneumonia 8/14: Intubated for progressive respiratory failure, poor bulbar function, and downtrending NIF and vital capacity sodium down to 115 from 124 but corrected to 119 with hyperglycemia.  115 was treated with hypertonic saline bolus.   8/15: IV sedating drips stopped, placed back on propofol in the evening hours.  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  Free water limited. 8/16: day 5 of 5 IVIG.  Remains profoundly weak.  Tracheostomy,  Getting IV Lasix for volume overload. 8/17: Remains in ICU  Interim History / Subjective:  Looks great this AM!  Trying to talk.  On PS.  Objective   Blood pressure 139/89, pulse (!) 107, temperature 98.6 F (37 C), temperature source Axillary, resp. rate (!) 23, weight 59.8 kg, last menstrual period 04/20/2005, SpO2 97 %.    Vent Mode: PRVC FiO2 (%):  [40 %-50 %] 50 % Set Rate:  [16 bmp] 16 bmp Vt Set:  [430 mL] 430 mL PEEP:  [5 cmH20] 5 cmH20 Pressure Support:  [10 cmH20] 10 cmH20 Plateau Pressure:  [10 cmH20-22 cmH20] 20 cmH20   Intake/Output Summary (Last 24 hours) at 02/12/2021 0735 Last data filed at 02/12/2021 0500 Gross per 24 hour  Intake 2000.79 ml  Output 3410 ml  Net -1409.21 ml    Filed Weights   02/10/21 0500 02/11/21 0600 02/12/21 0500  Weight: 62.9 kg 63.1 kg 59.8 kg    Examination: No distress Able to  move uppers and lowers today Both eyes open, still some abnormal gaze on L Mouthing words Lungs with rhonci but improved Ext warm Abdomen soft  Sodium improved K/Mg/Phos low, being replaced  Resolved Hospital Problem list   Septic shock/circulatory shock  Medication related hypotension   Assessment & Plan:   Acute respiratory failure with hypoxia w/ aspiration PNA and bibasilar atx.  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 Glaucoma Abnormal CT Abd- consider OP GI referral once over acute illness    - Heparin drip for now until we figure out if needs PEG - Lower sedating meds, was getting her pretty sleepy per family - Replete electrolytes, change metoprolol to q8h - Work on getting up to chair - I think she might do well at IP rehab - Try inline PMV again - On PS, depending on how looks through day may be able to try TC - Family updated at bedside   Best Practice (right click and "Reselect all SmartList Selections" daily)   Diet/type: tubefeeds and NPO DVT prophylaxis: systemic heparin GI prophylaxis: PPI Lines: Central line and yes and it is still needed- PICC Foley:  Yes, and it is still needed Code Status:  full code Last date of multidisciplinary goals of care discussion [Goals discussed w patient 8/13, would want full scope of care]  Patient critically ill due to respiratory failure Interventions to address this today vent titration Risk of deterioration without these interventions is high  I personally spent 34 minutes providing critical care not including any separately billable procedures  Erskine Emery MD [facility]  Prefer epic messenger for cross cover needs If after hours, please call E-link

## 2021-02-12 NOTE — Progress Notes (Signed)
eLink Physician-Brief Progress Note Patient Name: Angela Morales DOB: [birth year] MRN: [id number]   Date of Service  02/12/2021  HPI/Events of Note  Patient c/o pain post bath. Nurse requests 1 time dose of IV pain medication.   eICU Interventions  Plan: Fentanyl 12.5 mcg IV X 1 now.      Intervention Category Major Interventions: Other:  Lysle Dingwall 02/12/2021, 11:00 PM

## 2021-02-12 NOTE — Progress Notes (Signed)
Patient ID: LAVERA VANDERMEER, female   DOB: 03-Dec-1945, 75 y.o.   MRN: [id number]   NAME:  Angela Morales, MRN:  [id number], DOB:  [birth year], LOS: 6 ADMISSION DATE:  02/05/2021, CONSULTATION DATE:  02/07/21  REFERRING MD:  Dr Lupita Leash, CHIEF COMPLAINT:  tachypnea, hypoxemia   History of Present Illness:  75 year old woman with a history of atrial fibrillation (ablated 8/3), celiac disease, DDD who was admitted on 8/11 with bilateral upper and lower extremity weakness and paresthesias beginning about 8/8.  Developed some ascending weakness that resulted in inability to ambulate.  No other prodrome.  No clear precipitants although she was started on gabapentin about 1 month ago.  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  LP deferred initially as she was on Eliquis last dose 8/11.  IVIG started 8/12.  She has had progressive bilateral upper extremity weakness, some bulbar dysfunction difficulty swallowing, globus sensation with perception of swallowing and voice weakness.  FVC has been consistently greater than 1.0 L, NIF <-25.  She had an episode of desaturation, increased work of breathing and choking when eating Jell-O and pudding evening of 8/13.  Pertinent  Medical History   Past Medical History:  Diagnosis Date   Abnormal uterine bleeding    on HRT   Allergy    seasonal   Arthritis    Atrial fibrillation (HCC)    Celiac disease    DDD (degenerative disc disease)    Dysrhythmia    Glaucoma    Microscopic colitis    Osteoarthritis of both knees    PONV (postoperative nausea and vomiting)    SVT (supraventricular tachycardia) ([place])      Significant Hospital Events: Including procedures, antibiotic start and stop dates in addition to other pertinent events   IVIg started 8/12 CT CAP >> no dissection, bibasilar atelectasis, distended gallbladder with some pericholecystic stranding, segmental colitis question related to diverticular disease RUQ ultrasound  8/12 >> distended gallbladder without stones or pericholecystic fluid no evidence cholecystitis MR cervical, thoracic, lumbar spine 8/12 > normal spinal cord no imaging evidence of demyelination or Guillain-Barr.  Advanced lumbar DJD, cervical DJD 8/13 Zosyn started for possible aspiration pneumonia 8/14: Intubated for progressive respiratory failure, poor bulbar function, and downtrending NIF and vital capacity sodium down to 115 from 124 but corrected to 119 with hyperglycemia.  115 was treated with hypertonic saline bolus.  IV sedating drips stopped. 8/15: IV sedating drips stopped, placed back on propofol in the evening hours.  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  Free water limited. 8/16: day 5 of 5 IVIG.  Remains profoundly weak.  Tracheostomy,  Getting IV Lasix for volume overload. 8/17: Remains in ICU completed 5 days of zosyn.  Interim History / Subjective:    Objective   Blood pressure (!) 159/110, pulse (!) 117, temperature 98.6 F (37 C), temperature source Axillary, resp. rate (!) 22, weight 59.8 kg, last menstrual period 04/20/2005, SpO2 99 %.    Vent Mode: PRVC FiO2 (%):  [40 %-50 %] 50 % Set Rate:  [16 bmp] 16 bmp Vt Set:  [430 mL] 430 mL PEEP:  [5 cmH20] 5 cmH20 Pressure Support:  [10 cmH20] 10 cmH20 Plateau Pressure:  [10 cmH20-22 cmH20] 20 cmH20   Intake/Output Summary (Last 24 hours) at 02/12/2021 0701 Last data filed at 02/12/2021 0500 Gross per 24 hour  Intake 2000.79 ml  Output 3410 ml  Net -1409.21 ml   Filed Weights   02/10/21 0500 02/11/21 0600 02/12/21 0500  Weight: 62.9 kg 63.1 kg 59.8 kg    Examination: General: thin elderly female lying in bed, sedated HEENT: Normocephalic, atraumatic, orally intubated Pulmonary: orally intubated and mechanically ventilated, CTA bilaterally Cardiac: RRR, no M/R/G, no lower extremity edema Abdomen: soft, nontender, nondistended, bowel sounds present Extremities: warm, dry, well perfused Neuro: somewhat  sedated, opens eyes to voice, following commands, able to move L toes today on command, can move upper extremities on command  GU: foley in place  Resolved Hospital Problem list     Assessment & Plan:   Acute respiratory failure with hypoxia w/ aspiration PNA and bibasilar atx.  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   VC and NIF both downtrending. 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   -  Acetylcholine receptor studies - negative - f/u  MUSK antibodies are all pending-- collected 8/12 - completed 5 days IVIG 8/16 - no role for PLEX per neuro - Neuro checks q 4  Circulatory shock. Pressor dependent. Did meet SIRS criteria so w/ PNA will call this a mix of septic shock and also medication related hypotension No longer requiring pressors as of PM of 8/14.   - completed 5 days of zosyn 8/17 - Keep euvolemic - MAP goal > 65  Fluid & Electrolyte imbalance. Hyponatremia: , present on admission and acutely worse w/ NaCl infusion. Dropped as low as 115, got HT saline bolus of 50cc. Na then up to 124 (up 9 less than 12 hrs prior). This is too rapid however the drop to 115 corrected w/ glucose at the time was 119 so brings Na up by 5. Na this AM at 128.   - fluid restriction  - PRN hypertonic saline - goal Na in 120s if able - Limit free water - trend electrolytes - replete electrolytes this AM (phos and K)   Hyperglycemia - Cont ssi w/ goal 140-180   Leukocytosis, potentially due to IVIG vs pneumonia Afebrile. WBC down trending.  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

## 2021-02-12 NOTE — Progress Notes (Signed)
[facility] Progress Note Patient Name: Angela Morales DOB: Aug 28, 1945 MRN: [id number]   Date of Service  02/12/2021  HPI/Events of Note  Phosphorus 1.5.  eICU Interventions  Sodium Phosphorus 20 mmol iv x 1 ordered.        Frederik Pear 02/12/2021, 6:36 AM

## 2021-02-13 DIAGNOSIS — G610 Guillain-Barre syndrome: Secondary | ICD-10-CM | POA: Diagnosis not present

## 2021-02-13 LAB — BASIC METABOLIC PANEL
Anion gap: 6 (ref 5–15)
BUN: 21 mg/dL (ref 8–23)
CO2: 24 mmol/L (ref 22–32)
Calcium: 8 mg/dL — ABNORMAL LOW (ref 8.9–10.3)
Chloride: 96 mmol/L — ABNORMAL LOW (ref 98–111)
Creatinine, Ser: 0.38 mg/dL — ABNORMAL LOW (ref 0.44–1.00)
GFR, Estimated: >60 mL/min (ref >60)
Glucose, Bld: 130 mg/dL — ABNORMAL HIGH (ref 70–99)
Potassium: 4.4 mmol/L (ref 3.5–5.1)
Sodium: 126 mmol/L — ABNORMAL LOW (ref 135–145)

## 2021-02-13 LAB — CBC
HCT: 33 % — ABNORMAL LOW (ref 36.0–46.0)
Hemoglobin: 11.4 g/dL — ABNORMAL LOW (ref 12.0–15.0)
MCH: 33.6 pg (ref 26.0–34.0)
MCHC: 34.5 g/dL (ref 30.0–36.0)
MCV: 97.3 fL (ref 80.0–100.0)
Platelets: 164 10*3/uL (ref 150–400)
RBC: 3.39 MIL/uL — ABNORMAL LOW (ref 3.87–5.11)
RDW: 13.2 % (ref 11.5–15.5)
WBC: 13.3 10*3/uL — ABNORMAL HIGH (ref 4.0–10.5)
nRBC: 0.3 % — ABNORMAL HIGH (ref 0.0–0.2)

## 2021-02-13 LAB — GLUCOSE, CAPILLARY
Glucose-Capillary: 124 mg/dL — ABNORMAL HIGH (ref 70–99)
Glucose-Capillary: 127 mg/dL — ABNORMAL HIGH (ref 70–99)
Glucose-Capillary: 130 mg/dL — ABNORMAL HIGH (ref 70–99)
Glucose-Capillary: 141 mg/dL — ABNORMAL HIGH (ref 70–99)
Glucose-Capillary: 145 mg/dL — ABNORMAL HIGH (ref 70–99)
Glucose-Capillary: 149 mg/dL — ABNORMAL HIGH (ref 70–99)

## 2021-02-13 LAB — HEPARIN LEVEL (UNFRACTIONATED)
Heparin Unfractionated: 0.25 IU/mL — ABNORMAL LOW (ref 0.30–0.70)
Heparin Unfractionated: 0.43 IU/mL (ref 0.30–0.70)

## 2021-02-13 LAB — TRIGLYCERIDES: Triglycerides: 92 mg/dL (ref <150)

## 2021-02-13 MED ORDER — HALOPERIDOL LACTATE 5 MG/ML IJ SOLN
2.5000 mg | Freq: Four times a day (QID) | INTRAMUSCULAR | Status: DC | PRN
  Administered 2021-02-13 (×2): 2.5 mg via INTRAVENOUS
  Filled 2021-02-13: qty 1

## 2021-02-13 MED ORDER — QUETIAPINE FUMARATE 25 MG PO TABS
25.0000 mg | ORAL_TABLET | Freq: Two times a day (BID) | ORAL | Status: DC
  Administered 2021-02-13 – 2021-02-17 (×8): 25 mg
  Filled 2021-02-13 (×8): qty 1

## 2021-02-13 NOTE — Progress Notes (Signed)
ANTICOAGULATION CONSULT NOTE  Pharmacy Consult for heparin Indication: atrial fibrillation  Allergies  Allergen Reactions   Gluten Meal Other (See Comments)    celiac disease   Cefdinir Rash    Patient Measurements: Weight: 60.7 kg (133 lb 13.1 oz) Heparin Dosing Weight: 64kg  Vital Signs: Temp: 97.8 F (36.6 C) (08/19 0408) Temp Source: Axillary (08/19 0408) BP: 116/77 (08/19 0330) Pulse Rate: 79 (08/19 0354)  Labs: Recent Labs    02/11/21 0420 02/11/21 1138 02/12/21 0338 02/12/21 1200 02/12/21 2026 02/13/21 0316  HGB 11.7*  --  12.1  --   --  11.4*  HCT 33.6*  --  34.7*  --   --  33.0*  PLT 135*  --  161  --   --  164  HEPARINUNFRC 0.32  --  0.32  --   --  0.25*  CREATININE 0.41*   < > 0.42* 0.42* 0.35*  --    < > = values in this interval not displayed.     Estimated Creatinine Clearance: 52.5 mL/min (A) (by C-G formula based on SCr of 0.35 mg/dL (L)).   Medical History: Past Medical History:  Diagnosis Date   Abnormal uterine bleeding    on HRT   Allergy    seasonal   Arthritis    Atrial fibrillation (HCC)    Celiac disease    DDD (degenerative disc disease)    Dysrhythmia    Glaucoma    Microscopic colitis    Osteoarthritis of both knees    PONV (postoperative nausea and vomiting)    SVT (supraventricular tachycardia) [facility])     Assessment: 75 year old female with history of afib on apixaban prior to admit. 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 Last dose of apixaban appears to be prior to admission on 8/11 at 0830. Patient back in afib this evening, will bridge with heparin while apixaban washes out.   Heparin level slightly subtherapeutic (0.25) on gtt at 900 units/hr. No issues with line or bleeding reported per RN.  Goal of Therapy:  Heparin level 0.3-0.7 units/ml Monitor platelets by anticoagulation protocol: Yes   Plan:  Increase heparin to 1050 units/hr F/u 8 hr heparin level  Sherlon Handing, PharmD, BCPS Please see amion for complete clinical pharmacist phone list 02/13/2021 4:09 AM

## 2021-02-13 NOTE — Progress Notes (Signed)
Occupational Therapy Treatment Patient Details Name: Angela Morales MRN: [id number] DOB: 1946/03/08 Today's Date: 02/13/2021    History of present illness Pt is a 75 y.o. female admitted 02/05/21 with c/o progressive BUE/BLE numbness and weakness; diagnosed with Guillain-Barr syndrome. Pt also with afib with RVR. Intubated 8/14 due to Acute respiratory failure with hypoxia w/ aspiration PNA and bibasilar atx.Marland Kitchen MRI negative 8/15. Trach placed 8/16. CT brain 8/16 negative.  PMH includes afib (recent ablation 01/28/21), celiac disease, DDD, gluacoma, L TKA (2017).   OT comments  This 75 yo female admitted with above seen today for family education on PROM for Bil UEs and LEs as well as sitting EOB tolerance, alertness. Family (dtr-in-law) returned demo of exercises. Pt with increased RR when sat EOB while also with increased alertness (eyes opening and turning head to left to dtr-in-laws voice). She will continue to benefit from acute OT with follow up no changed to [facility].  Follow Up Recommendations  Supervision/Assistance - 24 hour;LTACH    Equipment Recommendations  Other (comment) (TBD next venue)       Precautions / Restrictions Precautions Precautions: Fall Precaution Comments: flexiseal Restrictions Weight Bearing Restrictions: No       Mobility Bed Mobility Overal bed mobility: Needs Assistance Bed Mobility: Supine to Sit;Sit to Supine     Supine to sit: Total assist;+2 for physical assistance Sit to supine: Total assist;+2 for physical assistance   General bed mobility comments: HOB up and using bed pad to spin pt around; HOB flat one at trunk and one at legs spinning around on pad                Balance Overall balance assessment: Needs assistance Sitting-balance support: Feet supported;Bilateral upper extremity supported Sitting balance-Leahy Scale: Zero Sitting balance - Comments: Arms on bed but not attempting to supports self. Sat EOB ~7 minutes with RR  increasing as high as 46 so layed pt back down.                                   ADL either performed or assessed with clinical judgement   ADL                                         General ADL Comments: total assist     Vision   Additional Comments: Eyes closed while supine, upon sitting up pt did open both eyes (R>L) but left eye closed back--pt able to open left eye one more time. When eyes are open she has a dysconjugate gaze and is not moving eyes (does turn head)          Cognition Arousal/Alertness: Lethargic Behavior During Therapy: Flat affect Overall Cognitive Status: Difficult to assess                                 General Comments: Not following any commands, moving arms spontaneously at times, did turn head to right when her dtr-in-law was on her left side and was speaking to her (did ths x2)        Exercises Other Exercises Other Exercises: Educated dtr-in-law on PROM of all joints UEs/LEs with recommendation to do this 3 times/day 10 reps each.Pt currently with full PROM all joints with slight  tightness in LUE supination, RUE wrist extension, Bil heel tightness but can get to neutral without much effort, and right hip tightness for adduction.      General Comments VSS. HR 110s-120s, RR 40-46, sats stable. Pt on 40% Fio2, 5 PEEP, PRVC mode    Pertinent Vitals/ Pain       Pain Assessment: Faces Faces Pain Scale: Hurts a little bit Pain Location: ankles and hamstrings with stretching Pain Descriptors / Indicators: Grimacing Pain Intervention(s): Limited activity within patient's tolerance;Monitored during session;Repositioned         Frequency  Min 2X/week        Progress Toward Goals  OT Goals(current goals can now be found in the care plan section)  Progress towards OT goals: Progressing toward goals  Acute Rehab OT Goals Patient Stated Goal: unable to state OT Goal Formulation: Patient unable  to participate in goal setting Time For Goal Achievement: 02/23/21 Potential to Achieve Goals: Good  Plan Discharge plan needs to be updated    Co-evaluation    PT/OT/SLP Co-Evaluation/Treatment: Yes (partial) Reason for Co-Treatment: Complexity of the patient's impairments (multi-system involvement) PT goals addressed during session: Mobility/safety with mobility;Balance;Strengthening/ROM OT goals addressed during session: Strengthening/ROM      AM-PAC OT "6 Clicks" Daily Activity     Outcome Measure   Help from another person eating meals?: Total Help from another person taking care of personal grooming?: Total Help from another person toileting, which includes using toliet, bedpan, or urinal?: Total Help from another person bathing (including washing, rinsing, drying)?: Total Help from another person to put on and taking off regular upper body clothing?: Total Help from another person to put on and taking off regular lower body clothing?: Total 6 Click Score: 6    End of Session    OT Visit Diagnosis: Other abnormalities of gait and mobility (R26.89);Muscle weakness (generalized) (M62.81);Pain;Other symptoms and signs involving cognitive function;Other symptoms and signs involving the nervous system (R29.898) Pain - Right/Left:  (both) Pain - part of body: Ankle and joints of foot;Leg   Activity Tolerance Other (comment) (limited by increase in RR)   Patient Left in bed;with call bell/phone within reach;with bed alarm set;with family/visitor present   Nurse Communication  (RN outside room reporting that she saw that pt did relatively well)        Time: 1410-1458 OT Time Calculation (min): 48 min  Charges: OT General Charges $OT Visit: 1 Visit OT Treatments $Therapeutic Activity: 8-22 mins $Therapeutic Exercise: 8-22 mins  Golden Circle, OTR/L Acute [employer] Pager [phone] Office [phone]     Almon Register 02/13/2021, 3:16 PM

## 2021-02-13 NOTE — Progress Notes (Signed)
  Speech Language Pathology Treatment: [name] Speaking valve  Patient Details Name: Angela Morales MRN: [id number] DOB: 08/26/1945 Today's Date: 02/13/2021 Time: 5701-7793 SLP Time Calculation (min) (ACUTE ONLY): 22 min  Assessment / Plan / Recommendation Clinical Impression  Tx was coordinated with RT in order to perform inline PMV trial. Pt was left on PS today with valve donned for 8 minutes. No overt signs of intolerance were noted, although she remains drowsy. Today she did have a productive cough x1 to clear secretions likely from her pharynx/larynx (could not be reached with tracheal suction or yankauer). Throughout session she was noted to clear her throat and swallow intermittently - suspect that additional secretions were still present. Although she mouthed "hello" x1, she did not have the breath support to initiate phonation. Vocal adduction was audibly noted x1 during coughing, but otherwise no voicing was heard despite cues. Education was provided to her son, who was present for today's session. Will continue to follow for PMV trials, which should be done only with SLP and RT for now.    HPI HPI: Angela Morales is a 75 y.o. female who presented with complaint of weakness and numbness in arms and legs. Concern for GBS, s/p IVIG completed 8/16. Initial BSE 8/13 with recommendation for full liquid diet. GI consult that day with concern for more neurological dysphagia vs esophageal. Later that day pt had a suspected aspiration event with jello. She was intubated 8/13 and trached 8/16. PMH significant for atrial fibrillation (on anticoagulation, recently s/p ablation on 8/3 after failed cardioversion on 10/30/2020), celiac disease, degenerative disc disease.      SLP Plan  Continue with current plan of care       Recommendations         Patient may use Passy-Muir Speech Valve: with SLP only         Oral Care Recommendations: Oral care QID Follow up Recommendations:  Inpatient Rehab SLP Visit Diagnosis: Aphonia (R49.1) Plan: Continue with current plan of care       GO                Osie Bond., M.A. [facility] Pager [phone] Office [phone]  02/13/2021, 10:31 AM

## 2021-02-13 NOTE — Progress Notes (Signed)
ANTICOAGULATION CONSULT NOTE  Pharmacy Consult for heparin Indication: atrial fibrillation  Allergies  Allergen Reactions   Gluten Meal Other (See Comments)    celiac disease   Cefdinir Rash    Patient Measurements: Height: 5' 4"  (162.6 cm) Weight: 60.7 kg (133 lb 13.1 oz) IBW/kg (Calculated) : 54.7 Heparin Dosing Weight: 64kg  Vital Signs: Temp: 97.6 F (36.4 C) (08/19 1100) Temp Source: Axillary (08/19 1100) BP: 125/75 (08/19 1330) Pulse Rate: 112 (08/19 1330)  Labs: Recent Labs    02/11/21 0420 02/11/21 1138 02/12/21 0338 02/12/21 1200 02/12/21 2026 02/13/21 0316 02/13/21 1200  HGB 11.7*  --  12.1  --   --  11.4*  --   HCT 33.6*  --  34.7*  --   --  33.0*  --   PLT 135*  --  161  --   --  164  --   HEPARINUNFRC 0.32  --  0.32  --   --  0.25* 0.43  CREATININE 0.41*   < > 0.42* 0.42* 0.35* 0.38*  --    < > = values in this interval not displayed.     Estimated Creatinine Clearance: 52.5 mL/min (A) (by C-G formula based on SCr of 0.38 mg/dL (L)).   Medical History: Past Medical History:  Diagnosis Date   Abnormal uterine bleeding    on HRT   Allergy    seasonal   Arthritis    Atrial fibrillation (HCC)    Celiac disease    DDD (degenerative disc disease)    Dysrhythmia    Glaucoma    Microscopic colitis    Osteoarthritis of both knees    PONV (postoperative nausea and vomiting)    SVT (supraventricular tachycardia) [facility])     Assessment: 75 year old female with history of afib on apixaban prior to admit. 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 Last dose of apixaban appears to be prior to admission on 8/11 at 0830. Patient back in afib this evening, will bridge with heparin while apixaban washes out.   Heparin level therapeutic (0.4) on gtt at 1050 units/hr. No issues with line or bleeding reported per RN.  Goal of Therapy:  Heparin level 0.3-0.7 units/ml Monitor platelets by anticoagulation protocol:  Yes   Plan:  Continue heparin  1050 units/hr Daily heparin level and CBC   Bonnita Nasuti Pharm.D. CPP, BCPS Clinical Pharmacist [phone] 02/13/2021 2:19 PM   02/13/2021 2:18 PM

## 2021-02-13 NOTE — Progress Notes (Signed)
Physical Therapy Treatment Patient Details Name: Angela Morales MRN: [id number] DOB: 13-Jan-1946 Today's Date: 02/13/2021    History of Present Illness Pt is a 75 y.o. female admitted 02/05/21 with c/o progressive BUE/BLE numbness and weakness; diagnosed with Guillain-Barr syndrome. Pt also with afib with RVR. Intubated 8/14 due to Acute respiratory failure with hypoxia w/ aspiration PNA and bibasilar atx.Marland Kitchen MRI negative 8/15. Trach placed 8/16. CT brain 8/16 negative.  PMH includes afib (recent ablation 01/28/21), celiac disease, DDD, gluacoma, L TKA (2017).    PT Comments    Pt was more obtunded today.  She did arousal upon sitting at EOB, but not able to follow commands.  Responded to stimuli in a generalized fashion, but to a lesser extent than last session.  Emphasis on arousal, transitions to EOB, working on truncal responses, cervical extension, trying to facilitate awareness to her surroundings.  Pt left well positioned in supine.  Treatment limited by RR increases to around 46 rpm.   Follow Up Recommendations  CIR;Supervision for mobility/OOB;Other (comment) (TBA as pt's status changes.)     Equipment Recommendations  Other (comment) (TBD)    Recommendations for Other Services       Precautions / Restrictions Precautions Precautions: Fall Precaution Comments: flexiseal Restrictions Weight Bearing Restrictions: No    Mobility  Bed Mobility Overal bed mobility: Needs Assistance Bed Mobility: Supine to Sit;Sit to Supine     Supine to sit: Total assist;+2 for physical assistance Sit to supine: Total assist;+2 for physical assistance   General bed mobility comments: HOB up and using bed pad to spin pt around; HOB flat one at trunk and one at legs spinning around on pad    Transfers                 General transfer comment: Due to  HR and RR increased with minimal exertion, pt did not transfer OOB to chair.  Ambulation/Gait             General Gait  Details: Unable to assess   Stairs             Wheelchair Mobility    Modified Rankin (Stroke Patients Only)       Balance Overall balance assessment: Needs assistance Sitting-balance support: Feet supported;Bilateral upper extremity supported Sitting balance-Leahy Scale: Zero Sitting balance - Comments: Arms on bed but not attempting to supports self. Sat EOB ~7 minutes with RR increasing as high as 46 so layed pt back down.                                    Cognition Arousal/Alertness: Lethargic Behavior During Therapy: Flat affect Overall Cognitive Status: Difficult to assess                                 General Comments: Not following any commands, moving arms spontaneously at times, did turn head to right when her dtr-in-law was on her left side and was speaking to her (did ths x2)      Exercises Other Exercises Other Exercises: Educated dtr-in-law on PROM of all joints UEs/LEs with recommendation to do this 3 times/day 10 reps each.Pt currently with full PROM all joints with slight tightness in LUE supination, RUE wrist extension, Bil heel tightness but can getw to neutral without much effort, and right hip tightness for adduction.  General Comments General comments (skin integrity, edema, etc.): VSS. HR 110s-120s, RR 40-46, sats stable. Pt on 40% Fio2, 5 PEEP, PRVC mode      Pertinent Vitals/Pain Pain Assessment: Faces Faces Pain Scale: Hurts a little bit Pain Location: ankles and hamstrings with stretching Pain Descriptors / Indicators: Grimacing Pain Intervention(s): Monitored during session;Repositioned    Home Living                      Prior Function            PT Goals (current goals can now be found in the care plan section) Acute Rehab PT Goals Patient Stated Goal: unable to state PT Goal Formulation: With patient/family Time For Goal Achievement: 02/21/21 Potential to Achieve Goals: Good Progress  towards PT goals: Not progressing toward goals - comment (lethargic on vent, needing sedation for restlessness, expect slower progress.)    Frequency    Min 4X/week      PT Plan Current plan remains appropriate    Co-evaluation PT/OT/SLP Co-Evaluation/Treatment: Yes Reason for Co-Treatment: Complexity of the patient's impairments (multi-system involvement) PT goals addressed during session: Mobility/safety with mobility;Strengthening/ROM OT goals addressed during session: Strengthening/ROM      AM-PAC PT "6 Clicks" Mobility   Outcome Measure  Help needed turning from your back to your side while in a flat bed without using bedrails?: Total Help needed moving from lying on your back to sitting on the side of a flat bed without using bedrails?: Total Help needed moving to and from a bed to a chair (including a wheelchair)?: Total Help needed standing up from a chair using your arms (e.g., wheelchair or bedside chair)?: Total Help needed to walk in hospital room?: Total Help needed climbing 3-5 steps with a railing? : Total 6 Click Score: 6    End of Session Equipment Utilized During Treatment: Oxygen Activity Tolerance: Patient limited by fatigue;Patient limited by lethargy (RR rose to 46 rpm sitting EOB) Patient left: in bed;with bed alarm set;with call bell/phone within reach;with family/visitor present Nurse Communication: Mobility status;Need for lift equipment PT Visit Diagnosis: Other abnormalities of gait and mobility (R26.89);Muscle weakness (generalized) (M62.81);Other symptoms and signs involving the nervous system (R29.898)     Time: 3662-9476 PT Time Calculation (min) (ACUTE ONLY): 29 min  Charges:  $Neuromuscular Re-education: 8-22 mins                     02/13/2021  Ginger Carne., PT Acute Rehabilitation Services [phone]  (pager) [phone]  (office)   Tessie Fass Aleigha Gilani 02/13/2021, 3:59 PM

## 2021-02-13 NOTE — Progress Notes (Addendum)
Patient ID: TIERANY APPLEBY, female   DOB: 1945/11/03, 75 y.o.   MRN: [id number]   NAME:  Angela Morales, MRN:  [id number], DOB:  [birth year], LOS: 7 ADMISSION DATE:  02/05/2021, CONSULTATION DATE:  02/07/21  REFERRING MD:  Dr Lupita Leash, CHIEF COMPLAINT:  tachypnea, hypoxemia   History of Present Illness:  75 year old woman with a history of atrial fibrillation (ablated 8/3), celiac disease, DDD who was admitted on 8/11 with bilateral upper and lower extremity weakness and paresthesias beginning about 8/8.  Developed some ascending weakness that resulted in inability to ambulate.  No other prodrome.  No clear precipitants although she was started on gabapentin about 1 month ago.  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  LP deferred initially as she was on Eliquis last dose 8/11.  IVIG started 8/12.  She has had progressive bilateral upper extremity weakness, some bulbar dysfunction difficulty swallowing, globus sensation with perception of swallowing and voice weakness.  FVC has been consistently greater than 1.0 L, NIF <-25.  She had an episode of desaturation, increased work of breathing and choking when eating Jell-O and pudding evening of 8/13.  Pertinent  Medical History   Past Medical History:  Diagnosis Date   Abnormal uterine bleeding    on HRT   Allergy    seasonal   Arthritis    Atrial fibrillation (HCC)    Celiac disease    DDD (degenerative disc disease)    Dysrhythmia    Glaucoma    Microscopic colitis    Osteoarthritis of both knees    PONV (postoperative nausea and vomiting)    SVT (supraventricular tachycardia) ([place])      Significant Hospital Events: Including procedures, antibiotic start and stop dates in addition to other pertinent events   IVIg started 8/12 CT CAP >> no dissection, bibasilar atelectasis, distended gallbladder with some pericholecystic stranding, segmental colitis question related to diverticular disease RUQ ultrasound  8/12 >> distended gallbladder without stones or pericholecystic fluid no evidence cholecystitis MR cervical, thoracic, lumbar spine 8/12 > normal spinal cord no imaging evidence of demyelination or Guillain-Barr.  Advanced lumbar DJD, cervical DJD 8/13 Zosyn started for possible aspiration pneumonia 8/14: Intubated for progressive respiratory failure, poor bulbar function, and downtrending NIF and vital capacity sodium down to 115 from 124 but corrected to 119 with hyperglycemia.  115 was treated with hypertonic saline bolus.  IV sedating drips stopped. 8/15: IV sedating drips stopped, placed back on propofol in the evening hours.  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  Free water limited. 8/16: day 5 of 5 IVIG.  Remains profoundly weak.  Tracheostomy,  Getting IV Lasix for volume overload. 8/17: Remains in ICU completed 5 days of zosyn.  8/18: started round 2 of  IVIG per neuro Interim History / Subjective:  Reported pain with bath and requested medication. Received fentanyl.   Objective   Blood pressure (!) 116/52, pulse 78, temperature 97.8 F (36.6 C), temperature source Axillary, resp. rate (!) 32, weight 60.7 kg, last menstrual period 04/20/2005, SpO2 99 %.    Vent Mode: PRVC FiO2 (%):  [40 %-50 %] 40 % Set Rate:  [16 bmp] 16 bmp Vt Set:  [430 mL] 430 mL PEEP:  [5 cmH20] 5 cmH20 Pressure Support:  [10 cmH20] 10 cmH20 Plateau Pressure:  [18 cmH20-21 cmH20] 18 cmH20   Intake/Output Summary (Last 24 hours) at 02/13/2021 0726 Last data filed at 02/13/2021 0648 Gross per 24 hour  Intake 2100.58 ml  Output 1500 ml  Net 600.58 ml   Filed Weights   02/11/21 0600 02/12/21 0500 02/13/21 0302  Weight: 63.1 kg 59.8 kg 60.7 kg    Examination: General: thin elderly female lying in bed, sleeping, NAD HEENT: Normocephalic, atraumatic, tracheostomy in place with no surrounding erythema, swelling, or discharge Pulmonary: mechanically ventilated, CTA bilaterally Cardiac: RRR, no  M/R/G, no lower extremity edema Abdomen: soft, nontender, nondistended, bowel sounds present Extremities: warm, dry, well perfused Neuro: tired, but opens eyes to voice, following commands, able to move all 4 extremities on command GU: foley in place  Resolved Hospital Problem list   Circulatory shock. Pressor dependent. Did meet SIRS criteria so w/ PNA will call this a mix of septic shock and also medication related hypotension  Assessment & Plan:   Acute respiratory failure with hypoxia w/ aspiration PNA and bibasilar atx.  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   VC and NIF both downtrending. Likely long recovery period.   - sedating meds lowered yesterday - will do another trial of pressure support and consider trach collar this afternoon pending results -Trach placed 8/16. - completed 5 days of zosyn 8/17   Presumed Guillain-Barr syndrome  Improving weakness today.   -  Acetylcholine receptor studies - negative - f/u  MUSK antibodies are all pending-- collected 8/12 - completed 5 days IVIG 8/16 - 2nd round of IVIG started per neuro - no role for PLEX per neuro - Neuro checks q 4   Fluid & Electrolyte imbalance. Hyponatremia: , present on admission and acutely worse w/ NaCl infusion. Dropped as low as 115, got HT saline bolus of 50cc. Na then up to 124 (up 9 less than 12 hrs prior). This is too rapid however the drop to 115 corrected w/ glucose at the time was 119 so brings Na up by 5. Na this AM at 126<128.   - fluid restriction  - PRN hypertonic saline - goal Na in 120s if able - Limit free water - trend electrolytes - replete electrolytes as needed  HTN - metoprolol 75 mg Q8H - labetalol PRN  - diltiazem 30 mg Q6H  Hyperglycemia - Cont ssi w/ goal 140-180   Leukocytosis, potentially due to IVIG vs pneumonia Afebrile. WBC down trending.  - Trend cbc - trend fever curve   Dysphagia  due to GBS - NPO  - tubefeeds - Will need swallow eval prior to initiating diet   Afib, A flutter, recent ablation on 01/28/21 - heparin per pharmacy   Glaucoma - timolol   Best Practice (right click and "Reselect all SmartList Selections" daily)   Diet/type: tubefeeds and NPO DVT prophylaxis: LMWH GI prophylaxis: PPI Lines: Central line and yes and it is still needed- PICC Foley:  Yes, and it is still needed Code Status:  full code Last date of multidisciplinary goals of care discussion [Goals discussed w patient 8/13, would want full scope of care]    Patient ID: Angela Morales, female   DOB: July 18, 1945, 75 y.o.   MRN: [id number]

## 2021-02-14 DIAGNOSIS — J9601 Acute respiratory failure with hypoxia: Secondary | ICD-10-CM | POA: Diagnosis not present

## 2021-02-14 DIAGNOSIS — G610 Guillain-Barre syndrome: Secondary | ICD-10-CM | POA: Diagnosis not present

## 2021-02-14 LAB — PHOSPHORUS: Phosphorus: 3 mg/dL (ref 2.5–4.6)

## 2021-02-14 LAB — GLUCOSE, CAPILLARY
Glucose-Capillary: 119 mg/dL — ABNORMAL HIGH (ref 70–99)
Glucose-Capillary: 133 mg/dL — ABNORMAL HIGH (ref 70–99)
Glucose-Capillary: 135 mg/dL — ABNORMAL HIGH (ref 70–99)
Glucose-Capillary: 136 mg/dL — ABNORMAL HIGH (ref 70–99)
Glucose-Capillary: 143 mg/dL — ABNORMAL HIGH (ref 70–99)

## 2021-02-14 LAB — HEPARIN LEVEL (UNFRACTIONATED): Heparin Unfractionated: 0.51 IU/mL (ref 0.30–0.70)

## 2021-02-14 LAB — BASIC METABOLIC PANEL
Anion gap: 5 (ref 5–15)
BUN: 20 mg/dL (ref 8–23)
CO2: 28 mmol/L (ref 22–32)
Calcium: 8.3 mg/dL — ABNORMAL LOW (ref 8.9–10.3)
Chloride: 97 mmol/L — ABNORMAL LOW (ref 98–111)
Creatinine, Ser: 0.45 mg/dL (ref 0.44–1.00)
GFR, Estimated: >60 mL/min (ref >60)
Glucose, Bld: 127 mg/dL — ABNORMAL HIGH (ref 70–99)
Potassium: 4.3 mmol/L (ref 3.5–5.1)
Sodium: 130 mmol/L — ABNORMAL LOW (ref 135–145)

## 2021-02-14 LAB — MAGNESIUM: Magnesium: 2.1 mg/dL (ref 1.7–2.4)

## 2021-02-14 MED ORDER — METOPROLOL TARTRATE 25 MG/10 ML ORAL SUSPENSION
100.0000 mg | Freq: Three times a day (TID) | ORAL | Status: DC
  Administered 2021-02-14 – 2021-02-18 (×12): 100 mg
  Filled 2021-02-14 (×16): qty 40

## 2021-02-14 MED ORDER — GUAIFENESIN 100 MG/5ML PO SOLN
15.0000 mL | Freq: Four times a day (QID) | ORAL | Status: DC
  Administered 2021-02-14 – 2021-03-02 (×66): 300 mg
  Filled 2021-02-14 (×4): qty 15
  Filled 2021-02-14: qty 5
  Filled 2021-02-14 (×3): qty 15
  Filled 2021-02-14: qty 10
  Filled 2021-02-14 (×2): qty 15
  Filled 2021-02-14: qty 75
  Filled 2021-02-14: qty 15
  Filled 2021-02-14: qty 5
  Filled 2021-02-14: qty 15
  Filled 2021-02-14: qty 5
  Filled 2021-02-14 (×3): qty 15
  Filled 2021-02-14: qty 75
  Filled 2021-02-14 (×2): qty 15
  Filled 2021-02-14: qty 5
  Filled 2021-02-14 (×2): qty 15
  Filled 2021-02-14: qty 75
  Filled 2021-02-14 (×2): qty 15
  Filled 2021-02-14: qty 75
  Filled 2021-02-14 (×3): qty 15
  Filled 2021-02-14: qty 5
  Filled 2021-02-14 (×2): qty 15
  Filled 2021-02-14: qty 10
  Filled 2021-02-14 (×2): qty 15
  Filled 2021-02-14: qty 5
  Filled 2021-02-14 (×9): qty 15
  Filled 2021-02-14: qty 5
  Filled 2021-02-14 (×12): qty 15
  Filled 2021-02-14: qty 10
  Filled 2021-02-14 (×3): qty 15
  Filled 2021-02-14 (×2): qty 5
  Filled 2021-02-14 (×2): qty 15

## 2021-02-14 NOTE — Progress Notes (Signed)
Subjective: Significant improvemetn today.   Exam: Vitals:   02/14/21 1517 02/14/21 1600  BP: 133/67 (!) 153/99  Pulse: (!) 104 (!) 106  Resp: (!) 24 (!) 25  Temp:  98.3 F (36.8 C)  SpO2:  98%   Gen: In bed, NAD Resp: non-labored breathing, no acute distress Abd: soft, nt  Neuro: MS: awakens to gentle stimulation, answers questions with nods/shakes.  CN:she hasimprovement in EOM, though still limited.  Motor: She has 2/5 thorughout the lower extremities today. She is able to lift both arms against grvity, 3/5 grip on the left, 2/5 on the right.  Sensory:she endorses diminished sensation in bilateral legs.  KHT:XHFSFS.   Pertinent Labs: Na 130  Impression: 75 yo F with acute ascending paralysis/sensory change with areflexia consistent with GBS. With cranial nerve involvement, this is most consistent with AIDP, but other GBS variants are also possible. Certainly with the severity of her symptoms, she likely has a long road ahead of her in terms of recovery. There has not been any strong evidence to support retreatment after an initial IVIG course, and more recently two studies have suggested no benefit of additional IVIG in non-responders, but the actual ideal dose of IVIG has not been clearly identifed. A study comparing 2.4 g/kg to 1.2 g/kg found benefit of the higher dose, and therefore I did additional IVIG given that she was still worsening as recently as her last day of her initial course.   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 No significant autonomic instability thus far.   Recommendations: 1) IVIG completed 8/16 for 2g/kg. Additional 562m/kg on 8/18 2) Na management per CCM 3) will continue to follow.    MRoland Rack MD [facility] [phone] If 7pm- 7am, please page neurology on call as listed in [facility]

## 2021-02-14 NOTE — Progress Notes (Signed)
Patient ID: Angela Morales, female   DOB: 1946/05/06, 75 y.o.   MRN: [id number]   NAME:  Angela Morales, MRN:  [id number], DOB:  [birth year], LOS: 8 ADMISSION DATE:  02/05/2021, CONSULTATION DATE:  02/07/21  REFERRING MD:  Dr Lupita Leash, CHIEF COMPLAINT:  tachypnea, hypoxemia   History of Present Illness:  75 year old woman with a history of atrial fibrillation (ablated 8/3), celiac disease, DDD who was admitted on 8/11 with bilateral upper and lower extremity weakness and paresthesias beginning about 8/8.  Developed some ascending weakness that resulted in inability to ambulate.  No other prodrome.  No clear precipitants although she was started on gabapentin about 1 month ago.  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  LP deferred initially as she was on Eliquis last dose 8/11.  IVIG started 8/12.  She has had progressive bilateral upper extremity weakness, some bulbar dysfunction difficulty swallowing, globus sensation with perception of swallowing and voice weakness.  FVC has been consistently greater than 1.0 L, NIF <-25.  She had an episode of desaturation, increased work of breathing and choking when eating Jell-O and pudding evening of 8/13.  Required intubation on 8/14.   Pertinent  Medical History   Past Medical History:  Diagnosis Date   Abnormal uterine bleeding    on HRT   Allergy    seasonal   Arthritis    Atrial fibrillation (HCC)    Celiac disease    DDD (degenerative disc disease)    Dysrhythmia    Glaucoma    Microscopic colitis    Osteoarthritis of both knees    PONV (postoperative nausea and vomiting)    SVT (supraventricular tachycardia) ([place])     Significant Hospital Events: Including procedures, antibiotic start and stop dates in addition to other pertinent events   IVIg started 8/12 CT CAP >> no dissection, bibasilar atelectasis, distended gallbladder with some pericholecystic stranding, segmental colitis question related to  diverticular disease RUQ ultrasound 8/12 >> distended gallbladder without stones or pericholecystic fluid no evidence cholecystitis MR cervical, thoracic, lumbar spine 8/12 > normal spinal cord no imaging evidence of demyelination or Guillain-Barr.  Advanced lumbar DJD, cervical DJD 8/13 Zosyn started for possible aspiration pneumonia 8/14: Intubated for progressive respiratory failure, poor bulbar function, and downtrending NIF and vital capacity sodium down to 115 from 124 but corrected to 119 with hyperglycemia.  115 was treated with hypertonic saline bolus.  IV sedating drips stopped. 8/15: IV sedating drips stopped, placed back on propofol in the evening hours.  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  Free water limited.  MRI neg.  8/16: day 5 of 5 IVIG.  Remains profoundly weak.  Tracheostomy,  Getting IV Lasix for volume overload.  [place] neg. 8/17: Remains in ICU, completed 5 days of zosyn.  8/18: started round 2 of  IVIG per neuro 8/19 fentanyl/ klonopin stopped (very sensitive/ too somnolent)  Interim History / Subjective:  Afebrile  Weaning well on PSV 10/5.  Did not wean yesterday Much more awake today, mouthing words and improving strength  UOP 1530 ml/ 24hr (1.1 ml/kg/hr) -188 ml/ net +1L  Objective   Blood pressure (!) 151/92, pulse 100, temperature 98.4 F (36.9 C), temperature source Axillary, resp. rate (!) 29, height 5' 4"  (1.626 m), weight 60.7 kg, last menstrual period 04/20/2005, SpO2 100 %.    Vent Mode: PSV;CPAP FiO2 (%):  [40 %] 40 % Set Rate:  [16 bmp] 16 bmp Vt Set:  [430 mL] 430 mL PEEP:  [  5 cmH20] 5 cmH20 Pressure Support:  [10 cmH20] 10 cmH20 Plateau Pressure:  [16 cmH20-18 cmH20] 16 cmH20   Intake/Output Summary (Last 24 hours) at 02/14/2021 0817 Last data filed at 02/14/2021 0600 Gross per 24 hour  Intake 1258.43 ml  Output 1425 ml  Net -166.57 ml   Filed Weights   02/11/21 0600 02/12/21 0500 02/13/21 0302  Weight: 63.1 kg 59.8 kg 60.7 kg     Examination: General:  Thin older female lying in bed in NAD HEENT: MM pink/moist, pupils 4/reactive, midline sutured trach - site good, R nare cortrak Neuro: Awake, mouths words, f/c, 3/5 UE, 2/5 LE CV: afib, rate 110-120, no murmur PULM:  non labored on PSV 10/5 with TV>468m, somewhat tachypneic in upper 20s, lungs CTA, minimal secretions GI: soft, bs+, ND, foley, rectal pouch   Extremities: warm/dry, no LE edema, prevalon boots in place Skin: no rashes    Resolved Hospital Problem list   Circulatory shock. Pressor dependent. Did meet SIRS criteria so w/ PNA will call this a mix of septic shock and also medication related hypotension  Assessment & Plan:   Acute respiratory failure with hypoxia w/ aspiration PNA and bibasilar atx.  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  Prn haldol, scheduled Seroquel.  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 Acetylcholine receptor studies - negative Completed 5 days IVIG 8/16 w/ additional dose 8/18, no role for PLEX per neuro - f/u  MUSK antibodies are all pending-- collected 8/12 - Neuro following, appreciate recs.  Long recovery expected.  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 Dropped as low as 115, got HT saline bolus of 50cc.  - continue fluid restriction/ limit free water - Na  continues to improve - trend BMETs  HTN - cont metoprolol 75 mg Q8H -> increase to [id number] - labetalol PRN   Hyperglycemia - Cont SSI  Leukocytosis, potentially due to IVIG vs pneumonia - continues to be afebrile with down trending WBC - monitor clinically   Dysphagia due to GBS - NPO, TF per cortrak - given improvement today, delay gtube discussions - ongoing SLP eval    Afib, A flutter, recent ablation on 01/28/21 - heparin per pharmacy  - increasing metoprolol as above to help with better rate control - will need to reach out to cardiology given failed ablation - goal K > 4, Mag > 2  Glaucoma - timolol   Best Practice (right click and "Reselect all SmartList Selections" daily)   Diet/type: tubefeeds and NPO DVT prophylaxis: LMWH GI prophylaxis: PPI Lines: Central line and yes and it is still needed- PICC RUE Foley:  Yes, and it is still needed Code Status:  full code Last date of multidisciplinary goals of care discussion [Goals discussed w patient 8/13, would want full scope of care]  son updated at bedside 8/20  CCT 30 mins  Kennieth Rad, ACNP [facility] 02/14/2021, 9:14 AM  See Amion for pager If no response to pager, please call PCCM consult pager After 7:00 pm call Elink

## 2021-02-14 NOTE — Progress Notes (Signed)
Inpatient Rehab Admissions Coordinator:   Per therapy recommendations, patient was screened for CIR candidacy by Clemens Catholic MS CCC-SLP  At this time, Pt. is not medically ready for CIR. She is not tolerating OOB at this time and remains on the ventilator. Pt would need to be fully weaned from the vent to be considered for CIR admission.  I will not pursue a rehab consult for this Pt. at this time, but CIR admissions team will follow and monitor for medical readiness and place consult order if Pt. appears to be an appropriate candidate. Please contact me with any questions.   Clemens Catholic, [place], [place] Admissions Coordinator  [phone] ([place]) [phone] (office)

## 2021-02-14 NOTE — Progress Notes (Signed)
ANTICOAGULATION CONSULT NOTE  Pharmacy Consult for heparin Indication: atrial fibrillation  Allergies  Allergen Reactions   Gluten Meal Other (See Comments)    celiac disease   Cefdinir Rash    Patient Measurements: Height: 5' 4"  (162.6 cm) Weight: 60.7 kg (133 lb 13.1 oz) IBW/kg (Calculated) : 54.7 Heparin Dosing Weight: 64kg  Vital Signs: Temp: 99.2 F (37.3 C) (08/20 0830) Temp Source: Oral (08/20 0830) BP: 133/86 (08/20 1204) Pulse Rate: 119 (08/20 1200)  Labs: Recent Labs    02/12/21 0338 02/12/21 1200 02/12/21 2026 02/13/21 0316 02/13/21 1200 02/14/21 0447  HGB 12.1  --   --  11.4*  --   --   HCT 34.7*  --   --  33.0*  --   --   PLT 161  --   --  164  --   --   HEPARINUNFRC 0.32  --   --  0.25* 0.43 0.51  CREATININE 0.42*   < > 0.35* 0.38*  --  0.45   < > = values in this interval not displayed.     Estimated Creatinine Clearance: 52.5 mL/min (by C-G formula based on SCr of 0.45 mg/dL).   Medical History: Past Medical History:  Diagnosis Date   Abnormal uterine bleeding    on HRT   Allergy    seasonal   Arthritis    Atrial fibrillation (HCC)    Celiac disease    DDD (degenerative disc disease)    Dysrhythmia    Glaucoma    Microscopic colitis    Osteoarthritis of both knees    PONV (postoperative nausea and vomiting)    SVT (supraventricular tachycardia) [facility])     Assessment: 75 year old female with history of afib on apixaban prior to admit. 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 Last dose of apixaban appears to be prior to admission on 8/11 at 0830. Patient back in afib this evening, will bridge with heparin while apixaban washes out.   Heparin level therapeutic (0.5) on gtt at 1050 units/hr. No issues with line or bleeding reported per RN. No CBC this am, will add to am labs.   Goal of Therapy:  Heparin level 0.3-0.7 units/ml Monitor platelets by anticoagulation protocol: Yes   Plan:   Continue heparin  1050 units/hr Daily heparin level and CBC  Erin Hearing PharmD., BCPS Clinical Pharmacist 02/14/2021 12:45 PM

## 2021-02-15 DIAGNOSIS — G610 Guillain-Barre syndrome: Secondary | ICD-10-CM | POA: Diagnosis not present

## 2021-02-15 DIAGNOSIS — J9601 Acute respiratory failure with hypoxia: Secondary | ICD-10-CM | POA: Diagnosis not present

## 2021-02-15 LAB — CBC
HCT: 33.4 % — ABNORMAL LOW (ref 36.0–46.0)
Hemoglobin: 11.3 g/dL — ABNORMAL LOW (ref 12.0–15.0)
MCH: 33.1 pg (ref 26.0–34.0)
MCHC: 33.8 g/dL (ref 30.0–36.0)
MCV: 97.9 fL (ref 80.0–100.0)
Platelets: 199 10*3/uL (ref 150–400)
RBC: 3.41 MIL/uL — ABNORMAL LOW (ref 3.87–5.11)
RDW: 13.9 % (ref 11.5–15.5)
WBC: 15.5 10*3/uL — ABNORMAL HIGH (ref 4.0–10.5)
nRBC: 0.5 % — ABNORMAL HIGH (ref 0.0–0.2)

## 2021-02-15 LAB — BASIC METABOLIC PANEL
Anion gap: 5 (ref 5–15)
BUN: 20 mg/dL (ref 8–23)
CO2: 28 mmol/L (ref 22–32)
Calcium: 8.3 mg/dL — ABNORMAL LOW (ref 8.9–10.3)
Chloride: 95 mmol/L — ABNORMAL LOW (ref 98–111)
Creatinine, Ser: 0.41 mg/dL — ABNORMAL LOW (ref 0.44–1.00)
GFR, Estimated: >60 mL/min (ref >60)
Glucose, Bld: 137 mg/dL — ABNORMAL HIGH (ref 70–99)
Potassium: 4.3 mmol/L (ref 3.5–5.1)
Sodium: 128 mmol/L — ABNORMAL LOW (ref 135–145)

## 2021-02-15 LAB — MUSK ANTIBODIES: MuSK Antibodies: <1 U/mL

## 2021-02-15 LAB — GLUCOSE, CAPILLARY
Glucose-Capillary: 125 mg/dL — ABNORMAL HIGH (ref 70–99)
Glucose-Capillary: 168 mg/dL — ABNORMAL HIGH (ref 70–99)
Glucose-Capillary: 131 mg/dL — ABNORMAL HIGH (ref 70–99)
Glucose-Capillary: 114 mg/dL — ABNORMAL HIGH (ref 70–99)
Glucose-Capillary: 125 mg/dL — ABNORMAL HIGH (ref 70–99)

## 2021-02-15 LAB — HEPARIN LEVEL (UNFRACTIONATED): Heparin Unfractionated: 0.42 IU/mL (ref 0.30–0.70)

## 2021-02-15 MED ORDER — GABAPENTIN 250 MG/5ML PO SOLN
200.0000 mg | Freq: Every day | ORAL | Status: DC
  Administered 2021-02-15 – 2021-03-01 (×15): 200 mg
  Filled 2021-02-15 (×19): qty 4

## 2021-02-15 MED ORDER — OXYCODONE HCL 5 MG PO TABS
2.5000 mg | ORAL_TABLET | ORAL | Status: DC | PRN
  Administered 2021-02-16 – 2021-03-02 (×15): 2.5 mg
  Filled 2021-02-15 (×16): qty 1

## 2021-02-15 NOTE — Progress Notes (Signed)
Subjective: Continues to improve. Her exam is somewhat effort dependant, and she wears out fast.   Exam: Vitals:   02/15/21 0755 02/15/21 0800  BP: (!) 111/93 116/63  Pulse:  (!) 105  Resp: (!) 29 (!) 31  Temp:    SpO2:  97%   Gen: In bed, NAD Resp: non-labored breathing, no acute distress Abd: soft, nt  Neuro: MS: awakens to gentle stimulation, answers questions with nods/shakes.  CN:she has improvement in EOM, though still limited in all directions Motor:      Right  Left Shoulder Abduction  4-/5  4-/5 Elbow Flexion   4-/5  4/5 Elbow Extension  3/5  4-/5 Wrist Flexion   1/5  3/5 Wrist Extension  2/5  3/5 Interossei   0/5  0/5  Hip Flexion   0/5  1/5 Hip adductors   0  2 Hip abductors   0   Knee Flexion   1/5  1/5 Knee Extension  0/5  0/5 Ankle Dorsiflexion  0/5  0/5 Ankle Plantarflexion  3/5  1/5 Able to wiggle toes on right.       Sensory:she endorses diminished sensation in bilateral legs.  ZYS:[id number].   Pertinent Labs: Na 130  Impression: 75 yo F with acute ascending paralysis/sensory change with areflexia consistent with GBS. No LP was done due to anticoagulation. With cranial nerve involvement, as well as with IVIG response, this is most consistent with AIDP, but other GBS variants are also possible.   Certainly with the severity of her symptoms, she likely has a long road ahead of her in terms of recovery. There has not been any strong evidence to support retreatment after an initial IVIG course, and more recently two studies have suggested no benefit of additional IVIG in non-responders, but the actual ideal dose of IVIG has not been clearly identifed. A study comparing 2.4 g/kg to 1.2 g/kg found benefit of the higher dose, and therefore I did additional IVIG given that she was still worsening as recently as her last day of her initial course.   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 No significant autonomic instability thus far.   Recommendations: 1) IVIG  completed 8/16 for 2g/kg. Additional 573m/kg on 8/18 No further treatment indicated.  2) Na management per CCM 3) will follow as needed moving forward. We will be available for questions.    MRoland Rack MD [facility] [phone] If 7pm- 7am, please page neurology on call as listed in ATrent

## 2021-02-15 NOTE — Progress Notes (Signed)
ANTICOAGULATION CONSULT NOTE  Pharmacy Consult for heparin Indication: atrial fibrillation  Allergies  Allergen Reactions   Gluten Meal Other (See Comments)    celiac disease   Cefdinir Rash    Patient Measurements: Height: 5' 4"  (162.6 cm) Weight: 60.7 kg (133 lb 13.1 oz) IBW/kg (Calculated) : 54.7 Heparin Dosing Weight: 64kg  Vital Signs: Temp: 98.1 F (36.7 C) (08/21 0739) Temp Source: Oral (08/21 0739) BP: 135/82 (08/21 1119) Pulse Rate: 114 (08/21 1119)  Labs: Recent Labs    02/13/21 0316 02/13/21 1200 02/14/21 0447 02/15/21 0436  HGB 11.4*  --   --  11.3*  HCT 33.0*  --   --  33.4*  PLT 164  --   --  199  HEPARINUNFRC 0.25* 0.43 0.51 0.42  CREATININE 0.38*  --  0.45 0.41*     Estimated Creatinine Clearance: 52.5 mL/min (A) (by C-G formula based on SCr of 0.41 mg/dL (L)).   Medical History: Past Medical History:  Diagnosis Date   Abnormal uterine bleeding    on HRT   Allergy    seasonal   Arthritis    Atrial fibrillation (HCC)    Celiac disease    DDD (degenerative disc disease)    Dysrhythmia    Glaucoma    Microscopic colitis    Osteoarthritis of both knees    PONV (postoperative nausea and vomiting)    SVT (supraventricular tachycardia) [facility])     Assessment: 75 year old female with history of afib on apixaban prior to admit. 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 Last dose of apixaban appears to be prior to admission on 8/11 at 0830. Patient back in afib this evening, will bridge with heparin while apixaban washes out.   Heparin level therapeutic (0.42) on gtt at 1050 units/hr. No issues with line or bleeding reported per RN. CBC stable.   Goal of Therapy:  Heparin level 0.3-0.7 units/ml Monitor platelets by anticoagulation protocol: Yes   Plan:  Continue heparin 1050 units/hr Daily heparin level and CBC  Erin Hearing PharmD., BCPS Clinical Pharmacist 02/15/2021 11:34 AM

## 2021-02-15 NOTE — Progress Notes (Signed)
Patient ID: ERSEL WADLEIGH, female   DOB: 05-24-1946, 75 y.o.   MRN: [id number]   NAME:  Angela Morales, MRN:  [id number], DOB:  1945/09/01, LOS: 9 ADMISSION DATE:  02/05/2021, CONSULTATION DATE:  02/07/21  REFERRING MD:  Dr Lupita Leash, CHIEF COMPLAINT:  tachypnea, hypoxemia   History of Present Illness:  75 year old woman with a history of atrial fibrillation (ablated 8/3), celiac disease, DDD who was admitted on 8/11 with bilateral upper and lower extremity weakness and paresthesias beginning about 8/8.  Developed some ascending weakness that resulted in inability to ambulate.  No other prodrome.  No clear precipitants although she was started on gabapentin about 1 month ago.  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  LP deferred initially as she was on Eliquis last dose 8/11.  IVIG started 8/12.  She has had progressive bilateral upper extremity weakness, some bulbar dysfunction difficulty swallowing, globus sensation with perception of swallowing and voice weakness.  FVC has been consistently greater than 1.0 L, NIF <-25.  She had an episode of desaturation, increased work of breathing and choking when eating Jell-O and pudding evening of 8/13.  Required intubation on 8/14.   Pertinent  Medical History   Past Medical History:  Diagnosis Date   Abnormal uterine bleeding    on HRT   Allergy    seasonal   Arthritis    Atrial fibrillation (HCC)    Celiac disease    DDD (degenerative disc disease)    Dysrhythmia    Glaucoma    Microscopic colitis    Osteoarthritis of both knees    PONV (postoperative nausea and vomiting)    SVT (supraventricular tachycardia) ([place])     Significant Hospital Events: Including procedures, antibiotic start and stop dates in addition to other pertinent events   IVIg started 8/12 CT CAP >> no dissection, bibasilar atelectasis, distended gallbladder with some pericholecystic stranding, segmental colitis question related to  diverticular disease RUQ ultrasound 8/12 >> distended gallbladder without stones or pericholecystic fluid no evidence cholecystitis MR cervical, thoracic, lumbar spine 8/12 > normal spinal cord no imaging evidence of demyelination or Guillain-Barr.  Advanced lumbar DJD, cervical DJD 8/13 Zosyn started for possible aspiration pneumonia 8/14: Intubated for progressive respiratory failure, poor bulbar function, and downtrending NIF and vital capacity sodium down to 115 from 124 but corrected to 119 with hyperglycemia.  115 was treated with hypertonic saline bolus.  IV sedating drips stopped. 8/15: IV sedating drips stopped, placed back on propofol in the evening hours.  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  Free water limited.  MRI neg.  8/16: day 5 of 5 IVIG.  Remains profoundly weak.  Tracheostomy,  Getting IV Lasix for volume overload.  [place] neg. 8/17: Remains in ICU, completed 5 days of zosyn.  8/18: started round 2 of  IVIG per neuro 8/19 fentanyl/ klonopin stopped (very sensitive/ too somnolent)  Interim History / Subjective:  No events. Family at bedside. Slept okay per nursing report.  Objective   Blood pressure 116/63, pulse (!) 105, temperature 98.1 F (36.7 C), temperature source Oral, resp. rate (!) 31, height 5' 4"  (1.626 m), weight 60.7 kg, last menstrual period 04/20/2005, SpO2 97 %.    Vent Mode: PRVC FiO2 (%):  [28 %-30 %] 28 % Set Rate:  [6 bmp-16 bmp] 6 bmp Vt Set:  [430 mL] 430 mL PEEP:  [5 cmH20] 5 cmH20   Intake/Output Summary (Last 24 hours) at 02/15/2021 0844 Last data filed at 02/15/2021 0800  Gross per 24 hour  Intake 1062.27 ml  Output 2065 ml  Net -1002.73 ml    Filed Weights   02/11/21 0600 02/12/21 0500 02/13/21 0302  Weight: 63.1 kg 59.8 kg 60.7 kg    Examination: Constitutional: elderly woman in NAD  Eyes: strabismus improved, still hard for her to open left eyelid Ears, nose, mouth, and throat: trach in place, small thick  secretions Cardiovascular: tachycardic, regular, 3:1 aflutter on monitor rate 100-110 Respiratory: scattered rhonci, no accessory muscle use Gastrointestinal: soft, +BS Skin: No rashes, normal turgor Neurologic: moves all 4 ext, RUE the best; poor truncal movement, unable to lift head Psychiatric: hard to assess given neurological deficits    Resolved Hospital Problem list   Circulatory shock. Pressor dependent. Did meet SIRS criteria so w/ PNA will call this a mix of septic shock and also medication related hypotension  Assessment & Plan:   Acute respiratory failure with hypoxia w/ aspiration PNA and bibasilar atx.  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 Acetylcholine receptor studies - negative Completed 5 days IVIG 8/16 w/ additional dose 8/18, no role for PLEX per neuro - f/u  MUSK antibodies are all pending-- collected 8/12 - Neuro following, appreciate recs.  Long recovery expected.  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 Dropped as low as 115, got HT saline bolus of 50cc.  - continue fluid restriction/ limit free water - Na continues to improve - trend BMETs - should improve as underlying illness is treated  HTN, afib s/p ablation question failure - cont metoprolol 113m q8H - labetalol PRN  - she gets hypotensive if dilt added - OP cardiology f/u  Hyperglycemia - Cont SSI  Leukocytosis, potentially due to IVIG vs pneumonia - monitor clinically, watch for s/s of infection  Dysphagia due to GBS - NPO, TF per cortrak - ongoing SLP eval  - may  need PEG, family understands  Glaucoma - timolol   Best Practice (right click and "Reselect all SmartList Selections" daily)   Diet/type: TF DVT prophylaxis: heparin gtt GI prophylaxis: PPI Lines: Central line and yes and it is still needed- PICC RUE Foley:  Yes, and it is still needed Code Status:  full code Last date of multidisciplinary goals of care discussion: full code, full recovery hoped for, family updated daily    CCT 35 mins  DErskine EmeryMD [facility] 02/15/2021, 8:44 AM  See Amion for pager If no response to pager, please call PCCM consult pager After 7:00 pm call Elink

## 2021-02-16 ENCOUNTER — Inpatient Hospital Stay (HOSPITAL_COMMUNITY): Payer: Medicare Other

## 2021-02-16 DIAGNOSIS — Z930 Tracheostomy status: Secondary | ICD-10-CM

## 2021-02-16 DIAGNOSIS — D72829 Elevated white blood cell count, unspecified: Secondary | ICD-10-CM

## 2021-02-16 DIAGNOSIS — G610 Guillain-Barre syndrome: Secondary | ICD-10-CM | POA: Diagnosis not present

## 2021-02-16 DIAGNOSIS — J9601 Acute respiratory failure with hypoxia: Secondary | ICD-10-CM | POA: Diagnosis not present

## 2021-02-16 DIAGNOSIS — I4891 Unspecified atrial fibrillation: Secondary | ICD-10-CM | POA: Diagnosis not present

## 2021-02-16 DIAGNOSIS — E440 Moderate protein-calorie malnutrition: Secondary | ICD-10-CM | POA: Insufficient documentation

## 2021-02-16 LAB — CBC
HCT: 33.4 % — ABNORMAL LOW (ref 36.0–46.0)
Hemoglobin: 11.2 g/dL — ABNORMAL LOW (ref 12.0–15.0)
MCH: 32.8 pg (ref 26.0–34.0)
MCHC: 33.5 g/dL (ref 30.0–36.0)
MCV: 97.9 fL (ref 80.0–100.0)
Platelets: 178 10*3/uL (ref 150–400)
RBC: 3.41 MIL/uL — ABNORMAL LOW (ref 3.87–5.11)
RDW: 13.7 % (ref 11.5–15.5)
WBC: 20.5 10*3/uL — ABNORMAL HIGH (ref 4.0–10.5)
nRBC: 0.1 % (ref 0.0–0.2)

## 2021-02-16 LAB — GLUCOSE, CAPILLARY
Glucose-Capillary: 115 mg/dL — ABNORMAL HIGH (ref 70–99)
Glucose-Capillary: 126 mg/dL — ABNORMAL HIGH (ref 70–99)
Glucose-Capillary: 189 mg/dL — ABNORMAL HIGH (ref 70–99)
Glucose-Capillary: 147 mg/dL — ABNORMAL HIGH (ref 70–99)
Glucose-Capillary: 147 mg/dL — ABNORMAL HIGH (ref 70–99)
Glucose-Capillary: 115 mg/dL — ABNORMAL HIGH (ref 70–99)
Glucose-Capillary: 161 mg/dL — ABNORMAL HIGH (ref 70–99)

## 2021-02-16 LAB — BASIC METABOLIC PANEL
Anion gap: 7 (ref 5–15)
BUN: 19 mg/dL (ref 8–23)
CO2: 26 mmol/L (ref 22–32)
Calcium: 8.1 mg/dL — ABNORMAL LOW (ref 8.9–10.3)
Chloride: 95 mmol/L — ABNORMAL LOW (ref 98–111)
Creatinine, Ser: 0.39 mg/dL — ABNORMAL LOW (ref 0.44–1.00)
GFR, Estimated: >60 mL/min (ref >60)
Glucose, Bld: 131 mg/dL — ABNORMAL HIGH (ref 70–99)
Potassium: 4.3 mmol/L (ref 3.5–5.1)
Sodium: 128 mmol/L — ABNORMAL LOW (ref 135–145)

## 2021-02-16 LAB — HEPARIN LEVEL (UNFRACTIONATED): Heparin Unfractionated: 0.56 IU/mL (ref 0.30–0.70)

## 2021-02-16 MED ORDER — ENOXAPARIN SODIUM 60 MG/0.6ML IJ SOSY
60.0000 mg | PREFILLED_SYRINGE | Freq: Two times a day (BID) | INTRAMUSCULAR | Status: DC
  Administered 2021-02-16 – 2021-02-23 (×16): 60 mg via SUBCUTANEOUS
  Filled 2021-02-16 (×18): qty 0.6

## 2021-02-16 NOTE — Progress Notes (Signed)
Physical Therapy Treatment Patient Details Name: Angela Morales MRN: [id number] DOB: 1945-09-06 Today's Date: 02/16/2021    History of Present Illness Pt is a 75 y.o. female admitted 02/05/21 with c/o progressive BUE/BLE numbness and weakness; diagnosed with Guillain-Barr syndrome. Pt also with afib with RVR. Intubated 8/14 due to Acute respiratory failure with hypoxia w/ aspiration PNA and bibasilar atx.[facility] Kitchen MRI negative 8/15. Trach placed 8/16. CT brain 8/16 negative.  PMH includes afib (recent ablation 01/28/21), celiac disease, DDD, gluacoma, L TKA (2017).    PT Comments    Pt awake, alert able to mouth responses and move all extremities. Pt able to tolerate chair position and assist with trunk shifting and lifting as well as move all extremities this session. Pt with decreased bil dorsiflexion with son present and educated for [facility] HEP throughout the day as well as progression of mobility. Pt fatigued end of session and returned to [facility] 35degrees with only partial foot drop in bed and prevalons.   PRVC 30% with SpO2 99% HR 113 with activity    Follow Up Recommendations  CIR;Supervision for mobility/OOB;LTACH (pending respiratory function)     Equipment Recommendations  Other (comment) (TBD)    Recommendations for Other Services       Precautions / Restrictions Precautions Precautions: Fall Precaution Comments: rectal pouch, trach, vent, cortrak    Mobility  Bed Mobility Overal bed mobility: Needs Assistance Bed Mobility: Supine to Sit;Sit to Supine           General bed mobility comments: foot egress positioning used  for supine<>sit with pt mod assist to lift trunk from surface in sitting and max assist to scoot pelvis. Total +2 to slide toward [facility] with return to supine. Full chair position grossly 6 min with report of dizziness with BP stable and no nystagmus    Transfers                    Ambulation/Gait                 Stairs              Wheelchair Mobility    Modified Rankin (Stroke Patients Only)       Balance Overall balance assessment: Needs assistance Sitting-balance support: Feet supported;Bilateral upper extremity supported   Sitting balance - Comments: pt able to use bil UE to shift trunk with mod assist and lift trunk from surface.                                    Cognition Arousal/Alertness: Awake/alert Behavior During Therapy: Flat affect Overall Cognitive Status: Difficult to assess                                 General Comments: pt following commands, responding to questions with mouthing and gestures      Exercises General Exercises - Upper Extremity Shoulder Flexion: AAROM;Both;Supine;10 reps General Exercises - Lower Extremity Long Arc Quad: AAROM;Both;Seated;10 reps Hip Flexion/Marching: AAROM;Both;Seated;10 reps    General Comments        Pertinent Vitals/Pain Faces Pain Scale: Hurts little more Pain Location: right scapula Pain Descriptors / Indicators: Aching Pain Intervention(s): Limited activity within patient's tolerance;Monitored during session;Repositioned;Other (comment) (massage)    Home Living  Prior Function            PT Goals (current goals can now be found in the care plan section) Progress towards PT goals: Progressing toward goals    Frequency    Min 4X/week      PT Plan Current plan remains appropriate    Co-evaluation              AM-PAC PT "6 Clicks" Mobility   Outcome Measure  Help needed turning from your back to your side while in a flat bed without using bedrails?: A Lot Help needed moving from lying on your back to sitting on the side of a flat bed without using bedrails?: Total Help needed moving to and from a bed to a chair (including a wheelchair)?: Total Help needed standing up from a chair using your arms (e.g., wheelchair or bedside chair)?: Total Help needed to  walk in hospital room?: Total Help needed climbing 3-5 steps with a railing? : Total 6 Click Score: 7    End of Session Equipment Utilized During Treatment: Other (comment) (vent) Activity Tolerance: Patient limited by fatigue Patient left: in bed;with bed alarm set;with call bell/phone within reach;with family/visitor present;with nursing/sitter in room Nurse Communication: Mobility status;Need for lift equipment PT Visit Diagnosis: Other abnormalities of gait and mobility (R26.89);Muscle weakness (generalized) (M62.81);Other symptoms and signs involving the nervous system (R29.898)     Time: 3612-2449 PT Time Calculation (min) (ACUTE ONLY): 30 min  Charges:  $Therapeutic Exercise: 8-22 mins $Therapeutic Activity: 8-22 mins                     Taris Galindo P, PT Acute Rehabilitation Services Pager: [phone] Office: [place] 02/16/2021, 10:17 AM

## 2021-02-16 NOTE — Progress Notes (Signed)
SLP Cancellation Note  Patient Details Name: Angela Morales MRN: [id number] DOB: 12-Jun-1946   Cancelled treatment:       Reason Eval/Treat Not Completed: Medical issues which prohibited therapy. Attempted to coordinate care this am with RT and RN for inline PMV tx. Despite efforts, pt was just switched back to full vent support, showing signs of fatigue. HR and RR both elevated at this time despite return to full vent support - RN considering pain medication. Plan made to try to coordinate care with RT again on next date, potentially even while on TC so as to maximize her trials without fatiguing her from one or the other. Will continue to follow.     Osie Bond., M.A. [place] Acute Rehabilitation Services Pager [phone] Office [phone]  02/16/2021, 10:43 AM

## 2021-02-16 NOTE — Progress Notes (Signed)
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  Ongoing  GOAL:   Patient will meet greater than or equal to 90% of their needs  Addressed via TF  MONITOR:   Vent status, Skin, TF tolerance, Weight trends, Labs, I & O's, Diet advancement  REASON FOR ASSESSMENT:   Consult, Ventilator    ASSESSMENT:   75 yo female admitted with acute respiratory failure with aspiration pneumonia, respiratory muscle weakness with bulbar dysfunction from presume GB type syndrome, encephalopathy, circulatory shock. PMH includes Celiac Disease  8/12 Admitted, IVIG started 8/14 Intubated 8/16 Gastric cortrak placed, s/p trach   Mental status fluctuates. Off pressors. Attempting trach collar today. Sodium improved. Tolerating tube feeding at goal rate. Consider PEG, if swallowing barriers continue or swallow function does not improve.   Admission weight: 66.1 kg  Current weight: 60.7 kg   UOP: 1475 ml x 24 hrs   Medications: colace, SS novolog, miralax, 60 mEq Kcl daily Labs: Na 128 (L) CBG 114-147  Flowsheet Row Most Recent Value  Orbital Region Mild depletion  Upper Arm Region Moderate depletion  Thoracic and Lumbar Region Unable to assess  Buccal Region Mild depletion  Temple Region Moderate depletion  Clavicle Bone Region Mild depletion  Clavicle and Acromion Bone Region Mild depletion  Scapular Bone Region Unable to assess  Dorsal Hand Moderate depletion  Patellar Region No depletion  Anterior Thigh Region No depletion  Posterior Calf Region Unable to assess  Edema (RD  Assessment) Unable to assess  Hair Reviewed  Eyes Unable to assess  Mouth Unable to assess  Skin Reviewed  Nails Reviewed      Diet Order:   Diet Order             Diet NPO time specified  Diet effective midnight                   EDUCATION NEEDS:   Not appropriate for education at this time  Skin:  Skin Assessment: Reviewed RN Assessment  Last BM:  8/19  Height:   Ht Readings from Last 1 Encounters:  02/13/21 5' 4"  (1.626 m)    Weight:   Wt Readings from Last 1 Encounters:  02/13/21 60.7 kg    BMI:  Body mass index is 22.97 kg/m.  Estimated Nutritional Needs:   Kcal:  1700-1900 kcals  Protein:  85-105 g  Fluid:  >/= 1.7 L  Chazz Philson MS, RD, LDN, CNSC Clinical Nutrition Pager listed in [place]

## 2021-02-16 NOTE — Progress Notes (Addendum)
ANTICOAGULATION CONSULT NOTE  Pharmacy Consult for heparin Indication: atrial fibrillation  Allergies  Allergen Reactions   Gluten Meal Other (See Comments)    celiac disease   Cefdinir Rash    Patient Measurements: Height: 5' 4"  (162.6 cm) Weight: 60.7 kg (133 lb 13.1 oz) IBW/kg (Calculated) : 54.7 Heparin Dosing Weight: 64kg  Vital Signs: Temp: 99 F (37.2 C) (08/22 0739) Temp Source: Oral (08/22 0739) BP: 113/79 (08/22 0600) Pulse Rate: 114 (08/22 0600)  Labs: Recent Labs    02/14/21 0447 02/15/21 0436 02/16/21 0420  HGB  --  11.3* 11.2*  HCT  --  33.4* 33.4*  PLT  --  199 178  HEPARINUNFRC 0.51 0.42 0.56  CREATININE 0.45 0.41* 0.39*     Estimated Creatinine Clearance: 52.5 mL/min (A) (by C-G formula based on SCr of 0.39 mg/dL (L)).   Medical History: Past Medical History:  Diagnosis Date   Abnormal uterine bleeding    on HRT   Allergy    seasonal   Arthritis    Atrial fibrillation (HCC)    Celiac disease    DDD (degenerative disc disease)    Dysrhythmia    Glaucoma    Microscopic colitis    Osteoarthritis of both knees    PONV (postoperative nausea and vomiting)    SVT (supraventricular tachycardia) [facility])     Assessment: 75 year old female with history of afib on apixaban prior to admit. Possible Guillain-Barr syndrome with plan for LP and other possible procedures. Last dose of apixaban appears to be prior to admission on 8/11 at 0830. Bridging with heparin   Heparin level therapeutic (0.56) on gtt at 1050 units/hr. CBC stable.   Goal of Therapy:  Heparin level 0.3-0.7 units/ml Monitor platelets by anticoagulation protocol: Yes   Plan:  Continue heparin 1050 units/hr Daily heparin level and CBC  Hildred Laser, PharmD Clinical Pharmacist **Pharmacist phone directory can now be found on [email or web address] ([email or web address]).  Listed under [place].  Addendum Changing to lovenox  Plan -Stop heparin  -begin lovenox 2m [place] q12h -CBC every 3  days  AHildred Laser PharmD Clinical Pharmacist **Pharmacist phone directory can now be found on [email or web address] ([email or web address]).  Listed under [place]

## 2021-02-16 NOTE — Progress Notes (Signed)
Patient ID: Angela Morales, female   DOB: 12-30-1945, 75 y.o.   MRN: [id number]   NAME:  Angela Morales, MRN:  [id number], DOB:  December 08, 1945, LOS: 78 ADMISSION DATE:  02/05/2021, CONSULTATION DATE:  02/07/21  REFERRING MD:  Dr Lupita Leash, CHIEF COMPLAINT:  tachypnea, hypoxemia   History of Present Illness:  75 year old woman with a history of atrial fibrillation (ablated 8/3), celiac disease, DDD who was admitted on 8/11 with bilateral upper and lower extremity weakness and paresthesias beginning about 8/8.  Developed some ascending weakness that resulted in inability to ambulate.  No other prodrome.  No clear precipitants although she was started on gabapentin about 1 month ago.  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  LP deferred initially as she was on Eliquis last dose 8/11.  IVIG started 8/12.  She has had progressive bilateral upper extremity weakness, some bulbar dysfunction difficulty swallowing, globus sensation with perception of swallowing and voice weakness.  FVC has been consistently greater than 1.0 L, NIF <-25.  She had an episode of desaturation, increased work of breathing and choking when eating Jell-O and pudding evening of 8/13.  Required intubation on 8/14.   Pertinent  Medical History   Past Medical History:  Diagnosis Date   Abnormal uterine bleeding    on HRT   Allergy    seasonal   Arthritis    Atrial fibrillation (HCC)    Celiac disease    DDD (degenerative disc disease)    Dysrhythmia    Glaucoma    Microscopic colitis    Osteoarthritis of both knees    PONV (postoperative nausea and vomiting)    SVT (supraventricular tachycardia) ([place])     Significant Hospital Events: Including procedures, antibiotic start and stop dates in addition to other pertinent events   IVIg started 8/12 CT CAP >> no dissection, bibasilar atelectasis, distended gallbladder with some pericholecystic stranding, segmental colitis question related to  diverticular disease RUQ ultrasound 8/12 >> distended gallbladder without stones or pericholecystic fluid no evidence cholecystitis MR cervical, thoracic, lumbar spine 8/12 > normal spinal cord no imaging evidence of demyelination or Guillain-Barr.  Advanced lumbar DJD, cervical DJD 8/13 Zosyn started for possible aspiration pneumonia 8/14: Intubated for progressive respiratory failure, poor bulbar function, and downtrending NIF and vital capacity sodium down to 115 from 124 but corrected to 119 with hyperglycemia.  115 was treated with hypertonic saline bolus.  IV sedating drips stopped. 8/15: IV sedating drips stopped, placed back on propofol in the evening hours.  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  Free water limited.  MRI neg.  8/16: day 5 of 5 IVIG.  Remains profoundly weak.  Tracheostomy,  Getting IV Lasix for volume overload.  [place] neg. 8/17: Remains in ICU, completed 5 days of zosyn.  8/18: started round 2 of  IVIG per neuro 8/19 fentanyl/ klonopin stopped (very sensitive/ too somnolent) 8/22 IVIG completed, strength slowly improving, tolerating trach collar intermittently during the day  Interim History / Subjective:   Pt initially somnolent  this AM, more wakeful later, no overnight events.  WBC 20k without fever, 1400cc UOP yesterday  Objective   Blood pressure 113/79, pulse (!) 114, temperature 98.1 F (36.7 C), temperature source Axillary, resp. rate (!) 24, height 5' 4"  (1.626 m), weight 60.7 kg, last menstrual period 04/20/2005, SpO2 98 %.    Vent Mode: PRVC FiO2 (%):  [28 %-30 %] 30 % Set Rate:  [16 bmp] 16 bmp Vt Set:  [430 mL] 430  mL PEEP:  [5 cmH20] 5 cmH20 Plateau Pressure:  [15 cmH20-19 cmH20] 16 cmH20   Intake/Output Summary (Last 24 hours) at 02/16/2021 0738 Last data filed at 02/16/2021 0600 Gross per 24 hour  Intake 1488.09 ml  Output 1475 ml  Net 13.09 ml    Filed Weights   02/11/21 0600 02/12/21 0500 02/13/21 0302  Weight: 63.1 kg 59.8 kg  60.7 kg     General:  elderly F, resting in bed in no distress,  HEENT: MM pink/moist, trach in place, sclera anicteric  Neuro:  able to raise RUE, difficulty raising LUE or lower extremities, able to open both eyes  CV: s1s2, tachycardic, irregular,  no m/r/g PULM:  mild bibasilar rhonchi, on full vent support without wheezing GI: soft, bsx4 active  Extremities: warm/dry, no edema  Skin: no rashes or lesions    Resolved Hospital Problem list   Circulatory shock. Pressor dependent. 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 with subsequent acute hypoxic respiratory failure and aspiration Pneumonia  Received IVIG 2g/kg which was completed 8/16, then an additional 522m/kg on 8/18 Acetylcholine receptor studies were negative, MUSK antibodies also negative  Neuro did not recommend PLEX P: -tolerating trach collar intermittently, plan for 2 hours bid and increase incrementally -continue VAP prevention and trach care -chest PT -PT/OT helping mobilize, likely prolonged recovery and not a candidate for inpatient rehab while requiring vent support, likely will need LTACH -completed course of Zosyn, increasing leukocytosis today but afebrile and CXR improved      Intermittent somnolence/encphalopathy Most likely ICU delirium, obtunded with haldol and sensitive to opiates P: -continue seroquel and avoid sedating agents as much as possible -delirium precautions    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 Dropped as low as 115, got HT saline bolus of 50cc.  P: -continue fluid restriction and limiting free water -trend BMET,  - continue fluid restriction/ limit free water - Na stable at 128 - trend BMETs - should improve as underlying illness is treated  HTN, afib s/p ablation  Echo 11/2020 with EF  55% P -continue metoprolol and prn labetalol  -hypotension noted with addition of Diltiazem -needs outpatient cardiology f/u    Hyperglycemia - Cont SSI   Dysphagia due to GBS - NPO, TF per cortrak - ongoing SLP eval  - may need PEG, family understands  Glaucoma - timolol   Best Practice (right click and "Reselect all SmartList Selections" daily)   Diet/type: TF DVT prophylaxis: prophylactic lovenox GI prophylaxis: PPI Lines: Central line and No longer needed.  Order written to d/c - PICC RUE Foley:  Yes, and it is still needed Code Status:  full code Last date of multidisciplinary goals of care discussion: full code, full recovery hoped for, son updated at the bedside 8/22    CRITICAL CARE Performed by: LOtilio CarpenGleason   Total critical care time: 33 minutes  Critical care time was exclusive of separately billable procedures and treating other patients.  Critical care was necessary to treat or prevent imminent or life-threatening deterioration.  Critical care was time spent personally by me on the following activities: development of treatment plan with patient and/or surrogate as well as nursing, discussions with consultants, evaluation of patient's response to treatment, examination of patient, obtaining history from patient or surrogate, ordering and performing treatments and interventions,  ordering and review of laboratory studies, ordering and review of radiographic studies, pulse oximetry and re-evaluation of patient's condition.    Otilio Carpen Alyda Megna, PA-C [facility] See Amion for pager If no response to pager , please call [phone] until 7pm After 7:00 pm call Elink  [phone]?[place]

## 2021-02-16 NOTE — Progress Notes (Signed)
Placed on TC per order for 2 hours as tolerated.  RT will continue to monitor.

## 2021-02-17 DIAGNOSIS — G610 Guillain-Barre syndrome: Secondary | ICD-10-CM | POA: Diagnosis not present

## 2021-02-17 DIAGNOSIS — J9601 Acute respiratory failure with hypoxia: Secondary | ICD-10-CM | POA: Diagnosis not present

## 2021-02-17 LAB — BASIC METABOLIC PANEL
Anion gap: 7 (ref 5–15)
BUN: 18 mg/dL (ref 8–23)
CO2: 24 mmol/L (ref 22–32)
Calcium: 8.2 mg/dL — ABNORMAL LOW (ref 8.9–10.3)
Chloride: 98 mmol/L (ref 98–111)
Creatinine, Ser: 0.34 mg/dL — ABNORMAL LOW (ref 0.44–1.00)
GFR, Estimated: >60 mL/min (ref >60)
Glucose, Bld: 105 mg/dL — ABNORMAL HIGH (ref 70–99)
Potassium: 4.5 mmol/L (ref 3.5–5.1)
Sodium: 129 mmol/L — ABNORMAL LOW (ref 135–145)

## 2021-02-17 LAB — GLUCOSE, CAPILLARY
Glucose-Capillary: 104 mg/dL — ABNORMAL HIGH (ref 70–99)
Glucose-Capillary: 110 mg/dL — ABNORMAL HIGH (ref 70–99)
Glucose-Capillary: 124 mg/dL — ABNORMAL HIGH (ref 70–99)
Glucose-Capillary: 133 mg/dL — ABNORMAL HIGH (ref 70–99)
Glucose-Capillary: 140 mg/dL — ABNORMAL HIGH (ref 70–99)
Glucose-Capillary: 143 mg/dL — ABNORMAL HIGH (ref 70–99)
Glucose-Capillary: 194 mg/dL — ABNORMAL HIGH (ref 70–99)

## 2021-02-17 MED ORDER — LORAZEPAM 2 MG/ML IJ SOLN
1.0000 mg | Freq: Once | INTRAMUSCULAR | Status: DC
Start: 2021-02-17 — End: 2021-02-17

## 2021-02-17 MED ORDER — QUETIAPINE FUMARATE 25 MG PO TABS
25.0000 mg | ORAL_TABLET | Freq: Every day | ORAL | Status: DC
  Administered 2021-02-18 – 2021-02-22 (×5): 25 mg
  Filled 2021-02-17 (×5): qty 1

## 2021-02-17 MED ORDER — LORAZEPAM 2 MG/ML IJ SOLN
1.0000 mg | Freq: Once | INTRAMUSCULAR | Status: AC
  Administered 2021-02-17: 1 mg via INTRAVENOUS
  Filled 2021-02-17: qty 1

## 2021-02-17 MED ORDER — LIDOCAINE 5 % EX PTCH
1.0000 | MEDICATED_PATCH | CUTANEOUS | Status: DC
  Administered 2021-02-18 – 2021-03-04 (×15): 1 via TRANSDERMAL
  Filled 2021-02-17 (×13): qty 1

## 2021-02-17 MED ORDER — LORAZEPAM 2 MG/ML IJ SOLN
0.5000 mg | INTRAMUSCULAR | Status: DC | PRN
Start: 2021-02-17 — End: 2021-03-04
  Administered 2021-02-17: 0.5 mg via INTRAVENOUS
  Filled 2021-02-17: qty 1

## 2021-02-17 MED ORDER — QUETIAPINE FUMARATE 25 MG PO TABS
25.0000 mg | ORAL_TABLET | Freq: Once | ORAL | Status: AC
  Administered 2021-02-17: 25 mg via ORAL
  Filled 2021-02-17: qty 1

## 2021-02-17 NOTE — Progress Notes (Signed)
RT note- taken off the ventilator at this time. Speech deflated cuff, tolerating well and working with PMV, continue to monitor.

## 2021-02-17 NOTE — Progress Notes (Signed)
Pt placed on ATC at 8L/35% as ordered.  At this time, Pt RR increased to >50bpm and pt looked labored.  At this time, this RT placed pt back on vent for rest.  RN aware.  RT will continue to monitor.

## 2021-02-17 NOTE — Progress Notes (Signed)
eLink Physician-Brief Progress Note Patient Name: Angela Morales DOB: [birth year] MRN: [id number]   Date of Service  02/17/2021  HPI/Events of Note  Patient anxious and tachypneic, previously improved with a PRN dose of Ativan, bedside RN asking for additional Ativan doses.  eICU Interventions  PRN Ativan ordered, Seroquel 25 mg via tube tonight.        Frederik Pear 02/17/2021, 10:49 PM

## 2021-02-17 NOTE — Progress Notes (Signed)
OT Cancellation Note  Patient Details Name: Angela Morales MRN: [id number] DOB: [birth year]   Cancelled Treatment:    Reason Eval/Treat Not Completed: Fatigue/lethargy limiting ability to participate- attempted but pt unable to engage due to lethargy. Will follow and see as able.   Jolaine Artist, OT Acute Rehabilitation Services Pager [phone] Office [phone]   Delight Stare 02/17/2021, 1:38 PM

## 2021-02-17 NOTE — Plan of Care (Signed)
Left message with CM regarding possibility of [facility] placement with call back number.   Otilio Carpen Aluna Whiston, PA-C

## 2021-02-17 NOTE — Progress Notes (Signed)
PT Cancellation Note  Patient Details Name: MILAGRO BELMARES MRN: [id number] DOB: [birth year]   Cancelled Treatment:    Reason Eval/Treat Not Completed: Fatigue/lethargy limiting ability to participate (pt lethargic and unable to be aroused with wet washcloth, noxious stimuli, repositioning and noise. Will continue to follow and attempt as pt more alert)   Shondrika Hoque B Keene Gilkey 02/17/2021, 12:49 PM Bayard Males, PT Acute Rehabilitation Services Pager: [phone] Office: [phone]

## 2021-02-17 NOTE — Progress Notes (Signed)
  Speech Language Pathology Treatment: [name] Speaking valve  Patient Details Name: Angela Morales MRN: [id number] DOB: 03-11-1946 Today's Date: 02/17/2021 Time: 5449-2010 SLP Time Calculation (min) (ACUTE ONLY): 35 min  Assessment / Plan / Recommendation Clinical Impression  Tx today was coordinated with RT, who placed pt on TC prior to PMV placement. Cuff was gradually deflated with thin secretions exiting her trach hub as she coughed. PMV was donned for 20 minutes before pt then began to quickly show signs of fatigue. HR and RR began to mildly rise, which could also be related to her current back pain, but pt was also able to verbalize how tired she felt. Throughout trial she was able to verbally communicate, although primarily at the word or very short phrase level given current breath support. SLP provided cueing for deep breaths, increased volume, and frequent coughs to try to clear audible secretions. It would typically take pt a few attempts before she could produce a productive cough, at which time the yankauer could be utilized. Pt was able to communicate some with her brother, who was present, and was able to say hello to her son, who called in at the end of the session.   Recommend continuing to try to coordinate trials of PMV while on TC as much as possible. While on TC, staff could briefly place PMV to facilitate attempts at communication or secretion management; however, would provide full supervision and keep sessions brief so as to not fatigue her too much.    HPI HPI: Angela Morales is a 75 y.o. female who presented with complaint of weakness and numbness in arms and legs. Concern for GBS, s/p IVIG completed 8/16. Initial BSE 8/13 with recommendation for full liquid diet. GI consult that day with concern for more neurological dysphagia vs esophageal. Later that day pt had a suspected aspiration event with jello. She was intubated 8/13 and trached 8/16. PMH significant for  atrial fibrillation (on anticoagulation, recently s/p ablation on 8/3 after failed cardioversion on 10/30/2020), celiac disease, degenerative disc disease.      SLP Plan  Continue with current plan of care       Recommendations         Patient may use Passy-Muir Speech Valve: Intermittently with supervision (full supervision to facilitate communication while on TC) PMSV Supervision: Full         Oral Care Recommendations: Oral care QID Follow up Recommendations: Inpatient Rehab SLP Visit Diagnosis: Aphonia (R49.1) Plan: Continue with current plan of care       GO                Angela Morales., M.A. [place] Acute Rehabilitation Services Pager [phone] Office [phone]  02/17/2021, 10:11 AM

## 2021-02-17 NOTE — Progress Notes (Signed)
RT note- patient very tachypneic, RN aware, patient appears to be frustrated or upset and will not let us know whats wrong. Continue to monitor.

## 2021-02-17 NOTE — Progress Notes (Signed)
Patient ID: Angela Morales, female   DOB: 27-Nov-1945, 75 y.o.   MRN: [id number]   NAME:  Angela Morales, MRN:  [id number], DOB:  08/21/1945, LOS: 15 ADMISSION DATE:  02/05/2021, CONSULTATION DATE:  02/07/21  REFERRING MD:  Dr Lupita Leash, CHIEF COMPLAINT:  tachypnea, hypoxemia   History of Present Illness:  75 year old woman with a history of atrial fibrillation (ablated 8/3), celiac disease, DDD who was admitted on 8/11 with bilateral upper and lower extremity weakness and paresthesias beginning about 8/8.  Developed some ascending weakness that resulted in inability to ambulate.  No other prodrome.  No clear precipitants although she was started on gabapentin about 1 month ago.  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  LP deferred initially as she was on Eliquis last dose 8/11.  IVIG started 8/12.  She has had progressive bilateral upper extremity weakness, some bulbar dysfunction difficulty swallowing, globus sensation with perception of swallowing and voice weakness.  FVC has been consistently greater than 1.0 L, NIF <-25.  She had an episode of desaturation, increased work of breathing and choking when eating Jell-O and pudding evening of 8/13.  Required intubation on 8/14.   Pertinent  Medical History   Past Medical History:  Diagnosis Date   Abnormal uterine bleeding    on HRT   Allergy    seasonal   Arthritis    Atrial fibrillation (HCC)    Celiac disease    DDD (degenerative disc disease)    Dysrhythmia    Glaucoma    Microscopic colitis    Osteoarthritis of both knees    PONV (postoperative nausea and vomiting)    SVT (supraventricular tachycardia) ([place])     Significant Hospital Events: Including procedures, antibiotic start and stop dates in addition to other pertinent events   IVIg started 8/12 CT CAP >> no dissection, bibasilar atelectasis, distended gallbladder with some pericholecystic stranding, segmental colitis question related to  diverticular disease RUQ ultrasound 8/12 >> distended gallbladder without stones or pericholecystic fluid no evidence cholecystitis MR cervical, thoracic, lumbar spine 8/12 > normal spinal cord no imaging evidence of demyelination or Guillain-Barr.  Advanced lumbar DJD, cervical DJD 8/13 Zosyn started for possible aspiration pneumonia 8/14: Intubated for progressive respiratory failure, poor bulbar function, and downtrending NIF and vital capacity sodium down to 115 from 124 but corrected to 119 with hyperglycemia.  115 was treated with hypertonic saline bolus.  IV sedating drips stopped. 8/15: IV sedating drips stopped, placed back on propofol in the evening hours.  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  Free water limited.  MRI neg.  8/16: day 5 of 5 IVIG.  Remains profoundly weak.  Tracheostomy,  Getting IV Lasix for volume overload.  [place] neg. 8/17: Remains in ICU, completed 5 days of zosyn.  8/18: started round 2 of  IVIG per neuro 8/19 fentanyl/ klonopin stopped (very sensitive/ too somnolent) 8/22 IVIG completed, strength slowly improving, tolerating trach collar intermittently during the day  Interim History / Subjective:   Still somewhat somnolent in the AM, weaned well for two hours and worked with speech  Objective   Blood pressure 115/80, pulse 85, temperature (!) 101.5 F (38.6 C), temperature source Oral, resp. rate (!) 26, height 5' 4"  (1.626 m), weight 61.8 kg, last menstrual period 04/20/2005, SpO2 97 %.    Vent Mode: PRVC FiO2 (%):  [30 %-35 %] 30 % Set Rate:  [16 bmp] 16 bmp Vt Set:  [430 mL] 430 mL PEEP:  [5 cmH20]  [place] Pressure:  [12 cmH20-19 cmH20] 19 cmH20   Intake/Output Summary (Last 24 hours) at 02/17/2021 1309 Last data filed at 02/17/2021 0700 Gross per 24 hour  Intake 1564.95 ml  Output 570 ml  Net 994.95 ml    Filed Weights   02/12/21 0500 02/13/21 0302 02/17/21 0500  Weight: 59.8 kg 60.7 kg 61.8 kg     General: Thin, elderly  female, resting in bed in no distress HEENT: MM pink/moist, trach in place, sclera anicteric, pupils equal Neuro: Left eyelid ptosis, moving all extremities though stronger right upper extremity and lower extremity CV: s1s2 sinus rhythm, no m/r/g PULM: Clear bilaterally, on vent support at the time of exam, no tachypnea or distress GI: soft, bsx4 active  Extremities: warm/dry, no edema  Skin: no rashes or lesions    Resolved Hospital Problem list   Circulatory shock. Pressor dependent. 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 with subsequent acute hypoxic respiratory failure and aspiration Pneumonia  Received IVIG 2g/kg which was completed 8/16, then an additional 576m/kg on 8/18 Acetylcholine receptor studies were negative, MUSK antibodies also negative  Neuro did not recommend PLEX P: -Continue with intermittent trach collar trials and gradually easing time off the vent -continue VAP prevention and trach care -chest PT -PT/OT helping mobilize, likely prolonged recovery and not a candidate for inpatient rehab while requiring vent support, likely will need LTACH -completed course of Zosyn, increasing leukocytosis today but afebrile and CXR improved      Intermittent somnolence/encphalopathy Most likely ICU delirium, obtunded with haldol and sensitive to opiates P: -Remains somnolent in the a.m., cut Seroquel 25 mg to daily instead of twice daily -Continue gabapentin 300 mg at night as she is complaining of significant back pain, add lidocaine patch -delirium precautions    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 Dropped as low as 115, got HT saline bolus of 50cc.  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 - NPO, TF per cortrak - ongoing SLP eval  - may need PEG, family understands  Glaucoma - timolol   Best Practice (right click and "Reselect all SmartList Selections" daily)   Diet/type: TF DVT prophylaxis: prophylactic lovenox GI prophylaxis: PPI Lines: N/A- PICC RUE Foley:  Yes, and it is still needed Code Status:  full code Last date of multidisciplinary goals of care discussion: full code, full recovery hoped for -Brother updated at the bedside on 8/23    CRITICAL CARE Performed by: LOtilio CarpenGleason   Total critical care time: 32 minutes  Critical care time was exclusive of separately billable procedures and treating other patients.  Critical care was necessary to treat or prevent imminent or life-threatening deterioration.  Critical care was time spent personally by me on the following activities: development of treatment plan with patient and/or surrogate as well as nursing, discussions with consultants, evaluation of patient's response to treatment, examination of patient, obtaining history from patient  or surrogate, ordering and performing treatments and interventions, ordering and review of laboratory studies, ordering and review of radiographic studies, pulse oximetry and re-evaluation of patient's condition.    Otilio Carpen Tieasha Larsen, PA-C [facility] See Amion for pager If no response to pager , please call [phone] until 7pm After 7:00 pm call Elink  [phone]

## 2021-02-18 ENCOUNTER — Inpatient Hospital Stay (HOSPITAL_COMMUNITY): Payer: Medicare Other

## 2021-02-18 DIAGNOSIS — R935 Abnormal findings on diagnostic imaging of other abdominal regions, including retroperitoneum: Secondary | ICD-10-CM | POA: Diagnosis not present

## 2021-02-18 DIAGNOSIS — J9811 Atelectasis: Secondary | ICD-10-CM

## 2021-02-18 LAB — POCT I-STAT 7, (LYTES, BLD GAS, ICA,H+H)
Acid-Base Excess: 2 mmol/L (ref 0.0–2.0)
Acid-Base Excess: 4 mmol/L — ABNORMAL HIGH (ref 0.0–2.0)
Bicarbonate: 23.9 mmol/L (ref 20.0–28.0)
Bicarbonate: 26.9 mmol/L (ref 20.0–28.0)
Calcium, Ion: 1.15 mmol/L (ref 1.15–1.40)
Calcium, Ion: 1.17 mmol/L (ref 1.15–1.40)
HCT: 31 % — ABNORMAL LOW (ref 36.0–46.0)
HCT: 34 % — ABNORMAL LOW (ref 36.0–46.0)
Hemoglobin: 10.5 g/dL — ABNORMAL LOW (ref 12.0–15.0)
Hemoglobin: 11.6 g/dL — ABNORMAL LOW (ref 12.0–15.0)
O2 Saturation: 96 %
O2 Saturation: 97 %
Patient temperature: 97.9
Patient temperature: 98.8
Potassium: 3.7 mmol/L (ref 3.5–5.1)
Potassium: 4.1 mmol/L (ref 3.5–5.1)
Sodium: 133 mmol/L — ABNORMAL LOW (ref 135–145)
Sodium: 133 mmol/L — ABNORMAL LOW (ref 135–145)
TCO2: 25 mmol/L (ref 22–32)
TCO2: 28 mmol/L (ref 22–32)
pCO2 arterial: 29.4 mmHg — ABNORMAL LOW (ref 32.0–48.0)
pCO2 arterial: 32.5 mmHg (ref 32.0–48.0)
pH, Arterial: 7.519 — ABNORMAL HIGH (ref 7.350–7.450)
pH, Arterial: 7.525 — ABNORMAL HIGH (ref 7.350–7.450)
pO2, Arterial: 70 mmHg — ABNORMAL LOW (ref 83.0–108.0)
pO2, Arterial: 77 mmHg — ABNORMAL LOW (ref 83.0–108.0)

## 2021-02-18 LAB — BASIC METABOLIC PANEL
Anion gap: 7 (ref 5–15)
BUN: 20 mg/dL (ref 8–23)
CO2: 25 mmol/L (ref 22–32)
Calcium: 8.1 mg/dL — ABNORMAL LOW (ref 8.9–10.3)
Chloride: 98 mmol/L (ref 98–111)
Creatinine, Ser: 0.57 mg/dL (ref 0.44–1.00)
GFR, Estimated: >60 mL/min (ref >60)
Glucose, Bld: 121 mg/dL — ABNORMAL HIGH (ref 70–99)
Potassium: 4.3 mmol/L (ref 3.5–5.1)
Sodium: 130 mmol/L — ABNORMAL LOW (ref 135–145)

## 2021-02-18 LAB — GLUCOSE, CAPILLARY
Glucose-Capillary: 116 mg/dL — ABNORMAL HIGH (ref 70–99)
Glucose-Capillary: 128 mg/dL — ABNORMAL HIGH (ref 70–99)
Glucose-Capillary: 129 mg/dL — ABNORMAL HIGH (ref 70–99)
Glucose-Capillary: 135 mg/dL — ABNORMAL HIGH (ref 70–99)
Glucose-Capillary: 142 mg/dL — ABNORMAL HIGH (ref 70–99)
Glucose-Capillary: 145 mg/dL — ABNORMAL HIGH (ref 70–99)
Glucose-Capillary: 152 mg/dL — ABNORMAL HIGH (ref 70–99)

## 2021-02-18 LAB — BRAIN NATRIURETIC PEPTIDE: B Natriuretic Peptide: 560.1 pg/mL — ABNORMAL HIGH (ref 0.0–100.0)

## 2021-02-18 MED ORDER — FUROSEMIDE 10 MG/ML IJ SOLN
20.0000 mg | Freq: Once | INTRAMUSCULAR | Status: AC
  Administered 2021-02-18: 20 mg via INTRAVENOUS
  Filled 2021-02-18: qty 2

## 2021-02-18 MED ORDER — SODIUM CHLORIDE 0.9 % IV BOLUS
250.0000 mL | Freq: Once | INTRAVENOUS | Status: AC
  Administered 2021-02-18: 250 mL via INTRAVENOUS

## 2021-02-18 NOTE — Progress Notes (Signed)
eLink Physician-Brief Progress Note Patient Name: Angela Morales DOB: 06-04-1946 MRN: [id number]   Date of Service  02/18/2021  HPI/Events of Note  BNP 560.  eICU Interventions  Lasix 20 mg iv x 1 ordered to try to reduce lung water and improve compliance, given profound respiratory muscle weakness.        Kerry Kass Emile Ringgenberg 02/18/2021, 4:23 AM

## 2021-02-18 NOTE — Progress Notes (Signed)
Occupational Therapy Treatment Patient Details Name: JOSELIN CRANDELL MRN: [id number] DOB: [birth year] Today's Date: 02/18/2021    History of present illness Pt is a 75 y.o. female admitted 02/05/21 with c/o progressive BUE/BLE numbness and weakness; diagnosed with Guillain-Barr syndrome. Pt also with afib with RVR. Intubated 8/14 due to Acute respiratory failure with hypoxia w/ aspiration PNA and bibasilar atx.[facility] MRI negative 8/15. Trach placed 8/16. CT brain 8/16 negative.  PMH includes afib (recent ablation 01/28/21), celiac disease, DDD, gluacoma, L TKA (2017).   OT comments  Patient supine in bed and RN cleared for OT/PT session.  Patient engaging in BUE exercises in supine, AAROM (see below) with cueing to sustain task attention to complete 5 reps in a row.   Patient positioned into chair position, engaged in hand washing with max assist.  Demonstrating improving use of BUEs to reposition trunk in upright, reports "a little" dizziness.  Pt mouthing words and following commands well.   Preference to keeps eyes closed during session, L eye remains closed. Further visual assessment next session.  Will follow acutely.    Follow Up Recommendations  Supervision/Assistance - 24 hour;LTACH    Equipment Recommendations  Other (comment) (TBD)    Recommendations for Other Services      Precautions / Restrictions Precautions Precautions: Fall Precaution Comments: trach, vent, cortrak Restrictions Weight Bearing Restrictions: No       Mobility Bed Mobility Overal bed mobility: Needs Assistance             General bed mobility comments: chair egress into sitting only today, pt repostioning into midline automatically without cueing and lifting head to engage    Transfers                 General transfer comment: deferred    Balance Overall balance assessment: Needs assistance Sitting-balance support: Feet supported;Bilateral upper extremity supported Sitting balance-Leahy  Scale: Zero Sitting balance - Comments: sitting supported and shifting trunk using B UEs for support to get to midline                                   ADL either performed or assessed with clinical judgement   ADL Overall ADL's : Needs assistance/impaired     Grooming: Wash/dry hands;Maximal assistance;Sitting Grooming Details (indicate cue type and reason): chair egress in bed with max assist givne increased time to wash hands                             Functional mobility during ADLs: Total assistance;+2 for physical assistance;+2 for safety/equipment General ADL Comments: total assist using chair egress today     Vision   Additional Comments: requires cueing to open eyes and maintain opening, only opening R eye during session (L eye remains closed) continue to assess vision   Perception     Praxis      Cognition Arousal/Alertness: Awake/alert Behavior During Therapy: Flat affect Overall Cognitive Status: Difficult to assess                                 General Comments: pt following commands, engaging appropriately but limited attention span, fatiguing easily        Exercises Exercises: General Upper Extremity General Exercises - Upper Extremity Shoulder Flexion: AAROM;Both;5 reps;Supine Shoulder Extension: AAROM;Both;5 reps;Supine Elbow Flexion:  AAROM;Both;10 reps;Supine Elbow Extension: AAROM;Both;5 reps;Supine Digit Composite Flexion: AROM;Both;5 reps;Supine Composite Extension: AAROM;Both;5 reps;Supine   Shoulder Instructions       General Comments PRVC on vent via trach. PEEP 7 at 45% FiO2, HR 130-140. RR up to 48 but typically 27-30.    Pertinent Vitals/ Pain       Pain Assessment: Faces Faces Pain Scale: Hurts even more Pain Location: back Pain Descriptors / Indicators: Discomfort;Grimacing;Tightness Pain Intervention(s): Limited activity within patient's tolerance;Monitored during  session;Repositioned  Home Living                                          Prior Functioning/Environment              Frequency  Min 2X/week        Progress Toward Goals  OT Goals(current goals can now be found in the care plan section)  Progress towards OT goals: Progressing toward goals  Acute Rehab OT Goals Patient Stated Goal: unable to state OT Goal Formulation: Patient unable to participate in goal setting  Plan Discharge plan remains appropriate;Frequency remains appropriate    Co-evaluation    PT/OT/SLP Co-Evaluation/Treatment: Yes Reason for Co-Treatment: Complexity of the patient's impairments (multi-system involvement);For patient/therapist safety;To address functional/ADL transfers   OT goals addressed during session: ADL's and self-care;Strengthening/ROM      AM-PAC OT "6 Clicks" Daily Activity     Outcome Measure   Help from another person eating meals?: Total Help from another person taking care of personal grooming?: A Lot Help from another person toileting, which includes using toliet, bedpan, or urinal?: Total Help from another person bathing (including washing, rinsing, drying)?: Total Help from another person to put on and taking off regular upper body clothing?: Total Help from another person to put on and taking off regular lower body clothing?: Total 6 Click Score: 7    End of Session    OT Visit Diagnosis: Other abnormalities of gait and mobility (R26.89);Muscle weakness (generalized) (M62.81);Pain;Other symptoms and signs involving cognitive function;Other symptoms and signs involving the nervous system (R29.898) Pain - part of body: Ankle and joints of foot (back)   Activity Tolerance Other (comment) (limited by RR and HR)   Patient Left in bed;with call bell/phone within reach;with bed alarm set;with family/visitor present;with SCD's reapplied   Nurse Communication Mobility status;Other (comment) (chair positoin  for 15 more minutes)        Time: 9030-0923 OT Time Calculation (min): 26 min  Charges: OT General Charges $OT Visit: 1 Visit OT Treatments $Neuromuscular Re-education: 8-22 mins  Jolaine Artist, OT [place] Pager [phone] Office [phone]    Delight Stare 02/18/2021, 12:44 PM

## 2021-02-18 NOTE — Progress Notes (Signed)
   02/18/21 1135  Clinical Encounter Type  Visited With Patient and family together  Visit Type Spiritual support;Follow-up  Referral From Family  Consult/Referral To Chaplain   Chaplain responded to the family's request. This chaplain spoke with the patient's brother, Jenny Reichmann. He stated the patient attended church regularly as a child but not much as an adult. He communicated the patient's spiritual needs, and it's important to him that the chaplain remind her of Christ's love for her. This chaplain provided a religious song; their favorite hymn is "Holy, holy, [place], " read Psalm 23, and prayed. Mr. Jenny Reichmann expressed how meaningful and uplifting our time together was for them. This note was prepared by Jeanine Luz, M.Div..  For questions please contact by phone [phone].

## 2021-02-18 NOTE — Progress Notes (Signed)
[place] [place] Progress Note Patient Name: Angela Morales DOB: 07/21/1945 MRN: [id number]   Date of Service  02/18/2021  HPI/Events of Note    eICU Interventions  ABG ordered for 7:30 AM.        Kerry Kass Kerin Kren 02/18/2021, 6:52 AM

## 2021-02-18 NOTE — Progress Notes (Signed)
Patient ID: Angela Morales, female   DOB: [birth year], 75 y.o.   MRN: [id number]   NAME:  Angela Morales, MRN:  [id number], DOB:  [birth year], LOS: 8 ADMISSION DATE:  02/05/2021, CONSULTATION DATE:  02/07/21  REFERRING MD:  Dr Lupita Leash, CHIEF COMPLAINT:  tachypnea, hypoxemia   History of Present Illness:  75 year old woman with a history of atrial fibrillation (ablated 8/3), celiac disease, DDD who was admitted on 8/11 with bilateral upper and lower extremity weakness and paresthesias beginning about 8/8.  Developed some ascending weakness that resulted in inability to ambulate.  No other prodrome.  No clear precipitants although she was started on gabapentin about 1 month ago.  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  LP deferred initially as she was on Eliquis last dose 8/11.  IVIG started 8/12.  She has had progressive bilateral upper extremity weakness, some bulbar dysfunction difficulty swallowing, globus sensation with perception of swallowing and voice weakness.  FVC has been consistently greater than 1.0 L, NIF <-25.  She had an episode of desaturation, increased work of breathing and choking when eating Jell-O and pudding evening of 8/13.  Required intubation on 8/14.   Pertinent  Medical History   Past Medical History:  Diagnosis Date   Abnormal uterine bleeding    on HRT   Allergy    seasonal   Arthritis    Atrial fibrillation (HCC)    Celiac disease    DDD (degenerative disc disease)    Dysrhythmia    Glaucoma    Microscopic colitis    Osteoarthritis of both knees    PONV (postoperative nausea and vomiting)    SVT (supraventricular tachycardia) ([place])     Significant Hospital Events: Including procedures, antibiotic start and stop dates in addition to other pertinent events   IVIg started 8/12 CT CAP >> no dissection, bibasilar atelectasis, distended gallbladder with some pericholecystic stranding, segmental colitis question related to  diverticular disease RUQ ultrasound 8/12 >> distended gallbladder without stones or pericholecystic fluid no evidence cholecystitis MR cervical, thoracic, lumbar spine 8/12 > normal spinal cord no imaging evidence of demyelination or Guillain-Barr.  Advanced lumbar DJD, cervical DJD 8/13 Zosyn started for possible aspiration pneumonia 8/14: Intubated for progressive respiratory failure, poor bulbar function, and downtrending NIF and vital capacity sodium down to 115 from 124 but corrected to 119 with hyperglycemia.  115 was treated with hypertonic saline bolus.  IV sedating drips stopped. 8/15: IV sedating drips stopped, placed back on propofol in the evening hours.  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  Free water limited.  MRI neg.  8/16: day 5 of 5 IVIG.  Remains profoundly weak.  Tracheostomy,  Getting IV Lasix for volume overload.  [place] neg. 8/17: Remains in ICU, completed 5 days of zosyn.  8/18: started round 2 of  IVIG per neuro 8/19 fentanyl/ klonopin stopped (very sensitive/ too somnolent) 8/22 IVIG completed, strength slowly improving, tolerating trach collar intermittently during the day  Interim History / Subjective:  Sleepy this AM but will open eyes to voice.  Got quite tachypneic yesterday afternoon with ATC per RT.  Objective   Blood pressure 126/84, pulse (!) 136, temperature 99.2 F (37.3 C), temperature source Oral, resp. rate (!) 33, height 5' 4"  (1.626 m), weight 61.3 kg, last menstrual period 04/20/2005, SpO2 98 %.    Vent Mode: PRVC FiO2 (%):  [24 %-45 %] 45 % Set Rate:  [16 bmp] 16 bmp Vt Set:  [430 mL] 430 mL PEEP:  [  5 cmH20-7 [place] Pressure:  [17 cmH20-25 cmH20] 17 cmH20   Intake/Output Summary (Last 24 hours) at 02/18/2021 0834 Last data filed at 02/18/2021 0700 Gross per 24 hour  Intake 129.91 ml  Output 1570 ml  Net -1440.09 ml    Filed Weights   02/13/21 0302 02/17/21 0500 02/18/21 0500  Weight: 60.7 kg 61.8 kg 61.3 kg     General: Adult female, resting in bed, in NAD. Neuro: Somnolent but arouses to voice.  Globally a bit weak. HEENT: [place]/AT. Sclerae anicteric. Trach C/D/I. Cardiovascular: RRR, no M/R/G.  Lungs: Respirations even and unlabored.  CTA bilaterally, No W/R/R. Abdomen: BS x 4, soft, NT/ND.  Musculoskeletal: No gross deformities, no edema.  Skin: Intact, warm, no rashes.  Resolved Hospital Problem list   Circulatory shock. Pressor dependent. 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 with subsequent acute hypoxic respiratory failure and aspiration Pneumonia s/p course of Zosyn Received IVIG 2g/kg which was completed 8/16, then an additional 568m/kg on 8/18 Acetylcholine receptor studies were negative, MUSK antibodies also negative  Neuro did not recommend PLEX P: -Continue with intermittent trach collar trials and gradually easing time off the vent as she tolerates -continue VAP prevention and trach care -chest PT -PT/OT helping mobilize, likely prolonged recovery and not a candidate for inpatient rehab while requiring vent support, likely will need LTACH ([facility]  Intermittent somnolence/encephalopathy Most likely ICU delirium, obtunded with haldol and sensitive to opiates P: -Remains somnolent in the a.m. 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 Dropped as low as 115, got HT saline bolus of 50cc.  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   RMontey Hora PWilliamsonPulmonary & Critical Care Medicine For pager details, please see AMION or use Epic chat  After 1900, please call ECarrizo Springsfor cross coverage needs 02/18/2021, 9:00 AM

## 2021-02-18 NOTE — Progress Notes (Signed)
[place] Progress Note Patient Name: Angela Morales DOB: 06-02-1946 MRN: [id number]   Date of Service  02/18/2021  HPI/Events of Note  Patient with persistent tachypnea, she's also dropped her blood pressure, although it was after a recent dose of Lopressor.  eICU Interventions  Stat portable CXR r/o pneumothorax, ABG, NS fluid bolus 250 ml iv x 1.        Kerry Kass [name] 02/18/2021, 12:13 AM

## 2021-02-18 NOTE — Progress Notes (Addendum)
[place] Progress Note Patient Name: Angela Morales DOB: [birth year] MRN: [id number]   Date of Service  02/18/2021  HPI/Events of Note  ABG result reviewed.  eICU Interventions  PEEP increased to 7, FIO2 increased to 45, sputum cultures ordered, BNP ordered to estimate volume status.         Saramarie Stinger U Waleed Dettman 02/18/2021, 1:25 AM

## 2021-02-18 NOTE — Progress Notes (Signed)
Physical Therapy Treatment Patient Details Name: Angela Morales MRN: [id number] DOB: 05/30/1946 Today's Date: 02/18/2021    History of Present Illness Pt is a 75 y.o. female admitted 02/05/21 with c/o progressive BUE/BLE numbness and weakness; diagnosed with Guillain-Barr syndrome. Pt also with afib with RVR. Intubated 8/14 due to Acute respiratory failure with hypoxia w/ aspiration PNA and bibasilar atx.Marland Kitchen MRI negative 8/15. Trach placed 8/16. CT brain 8/16 negative.  PMH includes afib (recent ablation 01/28/21), celiac disease, DDD, gluacoma, L TKA (2017).    PT Comments    Pt was more alert today, but session limited to in bed activity due to high HR at rest in the lower 140's.  Emphasis on full upper and Lower extremity exercises and heel cord stretching for 30 secs each and sitting tolerance fully upright in sitting egress position in the bed, pt automatically righting herself with use of UE's for positioning.    Follow Up Recommendations  CIR;Supervision for [facility]     Equipment Recommendations  Other (comment) (TBD)    Recommendations for Other Services       Precautions / Restrictions Precautions Precautions: Fall Precaution Comments: trach, vent, cortrak Restrictions Weight Bearing Restrictions: No    Mobility  Bed Mobility Overal bed mobility: Needs Assistance             General bed mobility comments: chair egress into sitting only today, pt repostioning into midline automatically without cueing and lifting head to engage    Transfers                 General transfer comment: deferred  Ambulation/Gait                 Stairs             Wheelchair Mobility    Modified Rankin (Stroke Patients Only)       Balance Overall balance assessment: Needs assistance Sitting-balance support: Feet supported;Bilateral upper extremity supported Sitting balance-Leahy Scale: Zero Sitting balance - Comments: sitting supported and  shifting trunk using B UEs for support to get to midline                                    Cognition Arousal/Alertness: Awake/alert Behavior During Therapy: Flat affect Overall Cognitive Status: Difficult to assess                                 General Comments: pt following commands, engaging appropriately but limited attention span, fatiguing easily      Exercises General Exercises - Upper Extremity Shoulder Flexion: AAROM;Both;5 reps;Supine Shoulder Extension: AAROM;Both;5 reps;Supine Elbow Flexion: AAROM;Both;10 reps;Supine Elbow Extension: AAROM;Both;5 reps;Supine Digit Composite Flexion: AROM;Both;5 reps;Supine Composite Extension: AAROM;Both;5 reps;Supine General Exercises - Lower Extremity Long Arc Quad: AROM;AAROM;Both;5 reps;Seated Heel Slides: AAROM;PROM;Both;10 reps;Supine (graded assist and resistance) Other Exercises Other Exercises: heel cord stretch bil with/without knee flextion for 30 secs. Other Exercises: A/P x5 Other Exercises: hipIR/ER x 5 reps.    General Comments General comments (skin integrity, edema, etc.): PRVC on vent via trach. PEEP 7 at 45% FiO2, HR 130-140. RR up to 48 but typically 27-30.      Pertinent Vitals/Pain Pain Assessment: Faces Faces Pain Scale: Hurts even more Pain Location: back Pain Descriptors / Indicators: Discomfort;Grimacing;Tightness Pain Intervention(s): Limited activity within patient's tolerance;Monitored during session    Home Living  Prior Function            PT Goals (current goals can now be found in the care plan section) Acute Rehab PT Goals Patient Stated Goal: unable to state PT Goal Formulation: With patient/family Time For Goal Achievement: 02/21/21 Potential to Achieve Goals: Good Progress towards PT goals: Progressing toward goals (slow progression toward goals)    Frequency    Min 4X/week      PT Plan Current plan remains  appropriate    Co-evaluation PT/OT/SLP Co-Evaluation/Treatment: Yes Reason for Co-Treatment: Complexity of the patient's impairments (multi-system involvement) PT goals addressed during session: Mobility/safety with mobility OT goals addressed during session: ADL's and self-care;Strengthening/ROM      AM-PAC PT "6 Clicks" Mobility   Outcome Measure  Help needed turning from your back to your side while in a flat bed without using bedrails?: A Lot Help needed moving from lying on your back to sitting on the side of a flat bed without using bedrails?: Total Help needed moving to and from a bed to a chair (including a wheelchair)?: Total Help needed standing up from a chair using your arms (e.g., wheelchair or bedside chair)?: Total Help needed to walk in hospital room?: Total Help needed climbing 3-5 steps with a railing? : Total 6 Click Score: 7    End of Session   Activity Tolerance: Patient limited by fatigue Patient left: in bed;with bed alarm set;with call bell/phone within reach;with family/visitor present Nurse Communication: Mobility status;Need for lift equipment PT Visit Diagnosis: Other abnormalities of gait and mobility (R26.89);Muscle weakness (generalized) (M62.81);Other symptoms and signs involving the nervous system (R29.898)     Time: 0960-4540 PT Time Calculation (min) (ACUTE ONLY): 26 min  Charges:  $Therapeutic Activity: 8-22 mins                     02/18/2021  Ginger Carne., PT Acute Rehabilitation Services [phone]  (pager) [phone]  (office)   Angela Morales 02/18/2021, 1:20 PM

## 2021-02-19 DIAGNOSIS — R935 Abnormal findings on diagnostic imaging of other abdominal regions, including retroperitoneum: Secondary | ICD-10-CM | POA: Diagnosis not present

## 2021-02-19 LAB — CBC
HCT: 28.8 % — ABNORMAL LOW (ref 36.0–46.0)
Hemoglobin: 9.9 g/dL — ABNORMAL LOW (ref 12.0–15.0)
MCH: 33.4 pg (ref 26.0–34.0)
MCHC: 34.4 g/dL (ref 30.0–36.0)
MCV: 97.3 fL (ref 80.0–100.0)
Platelets: 179 10*3/uL (ref 150–400)
RBC: 2.96 MIL/uL — ABNORMAL LOW (ref 3.87–5.11)
RDW: 13.9 % (ref 11.5–15.5)
WBC: 13.7 10*3/uL — ABNORMAL HIGH (ref 4.0–10.5)
nRBC: 0 % (ref 0.0–0.2)

## 2021-02-19 LAB — PHOSPHORUS: Phosphorus: 3.1 mg/dL (ref 2.5–4.6)

## 2021-02-19 LAB — BASIC METABOLIC PANEL
Anion gap: 6 (ref 5–15)
BUN: 24 mg/dL — ABNORMAL HIGH (ref 8–23)
CO2: 27 mmol/L (ref 22–32)
Calcium: 8.1 mg/dL — ABNORMAL LOW (ref 8.9–10.3)
Chloride: 99 mmol/L (ref 98–111)
Creatinine, Ser: 0.39 mg/dL — ABNORMAL LOW (ref 0.44–1.00)
GFR, Estimated: >60 mL/min (ref >60)
Glucose, Bld: 131 mg/dL — ABNORMAL HIGH (ref 70–99)
Potassium: 4 mmol/L (ref 3.5–5.1)
Sodium: 132 mmol/L — ABNORMAL LOW (ref 135–145)

## 2021-02-19 LAB — GLUCOSE, CAPILLARY
Glucose-Capillary: 102 mg/dL — ABNORMAL HIGH (ref 70–99)
Glucose-Capillary: 130 mg/dL — ABNORMAL HIGH (ref 70–99)
Glucose-Capillary: 132 mg/dL — ABNORMAL HIGH (ref 70–99)
Glucose-Capillary: 135 mg/dL — ABNORMAL HIGH (ref 70–99)
Glucose-Capillary: 134 mg/dL — ABNORMAL HIGH (ref 70–99)

## 2021-02-19 LAB — MAGNESIUM: Magnesium: 2.2 mg/dL (ref 1.7–2.4)

## 2021-02-19 MED ORDER — METOPROLOL TARTRATE 25 MG/10 ML ORAL SUSPENSION
100.0000 mg | Freq: Three times a day (TID) | ORAL | Status: DC
  Administered 2021-02-19 (×2): 100 mg
  Filled 2021-02-19 (×3): qty 40

## 2021-02-19 MED ORDER — METOPROLOL TARTRATE 50 MG PO TABS
100.0000 mg | ORAL_TABLET | Freq: Three times a day (TID) | ORAL | Status: DC
  Administered 2021-02-20 – 2021-02-22 (×7): 100 mg
  Filled 2021-02-19 (×7): qty 2

## 2021-02-19 NOTE — Progress Notes (Signed)
Physical Therapy Treatment Patient Details Name: Angela Morales MRN: [id number] DOB: Jan 25, 1946 Today's Date: 02/19/2021    History of Present Illness Pt is a 75 y.o. female admitted 02/05/21 with c/o progressive BUE/BLE numbness and weakness; diagnosed with Guillain-Barr syndrome. Pt also with afib with RVR. Intubated 8/14 due to Acute respiratory failure with hypoxia w/ aspiration PNA and bibasilar atx.Marland Kitchen MRI negative 8/15. Trach placed 8/16. CT brain 8/16 negative.  PMH includes afib (recent ablation 01/28/21), celiac disease, DDD, gluacoma, L TKA (2017).    PT Comments    Pt more alert able to open eyes wide, mouthing intelligible words to questions.  Bil UE's notably more mobile and arguably more coordinated, though still uncoordinated, B LE's less mobile.  Emphasis on transition to EOB, sitting balance EOB propped back on UE's, sit to stand and face to face transfer to recliner for work on sitting tolerance.    Follow Up Recommendations  CIR;Supervision for mobility/OOB;LTACH     Equipment Recommendations  Other (comment) (TBA)    Recommendations for Other Services Rehab consult     Precautions / Restrictions Precautions Precautions: Fall Precaution Comments: trach, vent, cortrak    Mobility  Bed Mobility Overal bed mobility: Needs Assistance Bed Mobility: Rolling;Sidelying to Sit;Sit to Supine Rolling: Max assist;+2 for safety/equipment Sidelying to sit: +2 for physical assistance;Total assist       General bed mobility comments: pt follow cues to roll with max assist bringing shoulder and Leg over.  Truncal assist to transition up via R elbow to sit.    Transfers Overall transfer level: Needs assistance Equipment used: None Transfers: Sit to/from Stand;Squat Pivot Transfers;Stand Pivot Transfers Sit to Stand: Total assist Stand pivot transfers: Total assist       General transfer comment: face to face total assist to stand and stand pivot  with block LE's to  chair onto lift pad.  Ambulation/Gait                 Stairs             Wheelchair Mobility    Modified Rankin (Stroke Patients Only)       Balance   Sitting-balance support: Feet supported;Bilateral upper extremity supported Sitting balance-Leahy Scale: Poor Sitting balance - Comments: pt able to bring her UE's into bil propped sitting with UEassist by therapist otherwise needed moderate assist at EOB     Standing balance-Leahy Scale: Zero                              Cognition Arousal/Alertness: Awake/alert Behavior During Therapy: WFL for tasks assessed/performed Overall Cognitive Status: Difficult to assess (much improved mentation.)                                 General Comments: pt following commands, engaging appropriately, better attention span, fatiguing easily      Exercises Other Exercises Other Exercises: heel cord stretch bil with/without knee flextion for 30 secs. Other Exercises: hip/knee flexion/ext  AA/graded assist/resistance both flex/ext x 10 reps Other Exercises: LAQ x5 bil in sitting    General Comments General comments (skin integrity, edema, etc.): vss on RA.  Son present.  educated on heel cord stretch      Pertinent Vitals/Pain Pain Assessment: Faces Faces Pain Scale: Hurts a little bit Pain Location: generalized discomfort Pain Intervention(s): Monitored during session    Home Living  Prior Function            PT Goals (current goals can now be found in the care plan section) Acute Rehab PT Goals Patient Stated Goal: get better. PT Goal Formulation: With patient/family Time For Goal Achievement: 02/21/21 Potential to Achieve Goals: Good Progress towards PT goals: Progressing toward goals    Frequency    Min 4X/week      PT Plan Current plan remains appropriate    Co-evaluation              AM-PAC PT "6 Clicks" Mobility   Outcome Measure   Help needed turning from your back to your side while in a flat bed without using bedrails?: A Lot Help needed moving from lying on your back to sitting on the side of a flat bed without using bedrails?: Total Help needed moving to and from a bed to a chair (including a wheelchair)?: Total Help needed standing up from a chair using your arms (e.g., wheelchair or bedside chair)?: Total Help needed to walk in hospital room?: Total Help needed climbing 3-5 steps with a railing? : Total 6 Click Score: 7    End of Session Equipment Utilized During Treatment: Oxygen Activity Tolerance: Patient tolerated treatment well;Patient limited by fatigue Patient left: in chair;with call bell/phone within reach;with chair alarm set;with family/visitor present;Other (comment) (on maxi sky pad.) Nurse Communication: Mobility status;Need for lift equipment PT Visit Diagnosis: Other abnormalities of gait and mobility (R26.89);Muscle weakness (generalized) (M62.81);Other symptoms and signs involving the nervous system (R29.898)     Time: 1354-1420 PT Time Calculation (min) (ACUTE ONLY): 26 min  Charges:  $Therapeutic Activity: 8-22 mins $Neuromuscular Re-education: 8-22 mins                     02/19/2021  Angela Morales., PT Acute Rehabilitation Services [phone]  (pager) [phone]  (office)   Angela Morales Angela Morales 02/19/2021, 4:47 PM

## 2021-02-19 NOTE — Progress Notes (Signed)
ANTICOAGULATION CONSULT NOTE  Pharmacy Consult for heparin Indication: atrial fibrillation  Allergies  Allergen Reactions   Gluten Meal Other (See Comments)    celiac disease   Cefdinir Rash    Patient Measurements: Height: 5' 4"  (162.6 cm) Weight: 61.3 kg (135 lb 2.3 oz) IBW/kg (Calculated) : 54.7 Heparin Dosing Weight: 64kg  Vital Signs: Temp: 96.9 F (36.1 C) (08/24 2355) Temp Source: Axillary (08/24 2355) BP: 95/53 (08/25 0700) Pulse Rate: 108 (08/25 0700)  Labs: Recent Labs    02/17/21 0317 02/18/21 0041 02/18/21 0041 02/18/21 0045 02/18/21 0744 02/19/21 0540  HGB  --  10.5*   < >  --  11.6* 9.9*  HCT  --  31.0*  --   --  34.0* 28.8*  PLT  --   --   --   --   --  179  CREATININE 0.34*  --   --  0.57  --  0.39*   < > = values in this interval not displayed.     Estimated Creatinine Clearance: 52.5 mL/min (A) (by C-G formula based on SCr of 0.39 mg/dL (L)).   Medical History: Past Medical History:  Diagnosis Date   Abnormal uterine bleeding    on HRT   Allergy    seasonal   Arthritis    Atrial fibrillation (HCC)    Celiac disease    DDD (degenerative disc disease)    Dysrhythmia    Glaucoma    Microscopic colitis    Osteoarthritis of both knees    PONV (postoperative nausea and vomiting)    SVT (supraventricular tachycardia) [facility])     Assessment: 75 year old female with history of afib on apixaban prior to admit. She is here with respiratory failure due to suspected GBS. Last dose of apixaban appears to be prior to admission on 8/11 at 0830. Bridging with heparin and changed to lovenox on 8/22.   Undergoing trach trials and may need a PEG placed based on progress. -hg= 11.6>9.9, SCr= 0.39   Goal of Therapy:   Monitor platelets by anticoagulation protocol: Yes   Plan:  -Continue lovenox 50m [place] q12h -CBC every 3 days  AHildred Laser PharmD Clinical Pharmacist **Pharmacist phone directory can now be found on [email or web address] ([email or web address]).  Listed  under [place]

## 2021-02-19 NOTE — Progress Notes (Signed)
  Speech Language Pathology Treatment: [name] Speaking valve  Patient Details Name: Angela Morales MRN: [id number] DOB: 05-Nov-1945 Today's Date: 02/19/2021 Time: 0921-0935 SLP Time Calculation (min) (ACUTE ONLY): 14 min  Assessment / Plan / Recommendation Clinical Impression  Cuff with residual air deflated to ensure safety of PMV placement. PMV was donned for 5 minutes before pt respiratory rate began to rise to the low 30s and pt indicated fatigue. Throughout trial, she was able to verbally communicate at the word, phrase, and sentence levels. Breath support still impaired but improving since last treatment session; impacted most by fatigue. SLP provided cueing for strong cough to clear thick, yellow secretions. Secretions were expectorated to level of the oral cavity with valve placed and to level of the trach hub when valve was removed. Pt was able to communicate some with her brother before indicating fatigue with speaking. Recommend placement of valve by trained staff for short periods of time pending fatigue and when TC is being tried. Would place only with SLP if pt is on vent.   HPI HPI: Angela Morales is a 75 y.o. female who presented with complaint of weakness and numbness in arms and legs. Concern for GBS, s/p IVIG completed 8/16. Initial BSE 8/13 with recommendation for full liquid diet. GI consult that day with concern for more neurological dysphagia vs esophageal. Later that day pt had a suspected aspiration event with jello. She was intubated 8/13 and trached 8/16. PMH significant for atrial fibrillation (on anticoagulation, recently s/p ablation on 8/3 after failed cardioversion on 10/30/2020), celiac disease, degenerative disc disease.      SLP Plan  Continue with current plan of care       Recommendations         Patient may use Passy-Muir Speech Valve: Intermittently with supervision (Staff present with TC; short periods of time based on level of fatigue.) PMSV  Supervision: Full         Oral Care Recommendations: Oral care QID Follow up Recommendations: Inpatient Rehab SLP Visit Diagnosis: Aphonia (R49.1) Plan: Continue with current plan of care       GO             Dewitt Rota, SLP-Student    Dewitt Rota 02/19/2021, 10:08 AM

## 2021-02-19 NOTE — Progress Notes (Signed)
Patient ID: Angela Morales, female   DOB: 1946-02-18, 75 y.o.   MRN: [id number]   NAME:  Angela Morales, MRN:  [id number], DOB:  02-Jun-1946, LOS: 83 ADMISSION DATE:  02/05/2021, CONSULTATION DATE:  02/07/21  REFERRING MD:  Dr Lupita Leash, CHIEF COMPLAINT:  tachypnea, hypoxemia   History of Present Illness:  75 year old woman with a history of atrial fibrillation (ablated 8/3), celiac disease, DDD who was admitted on 8/11 with bilateral upper and lower extremity weakness and paresthesias beginning about 8/8.  Developed some ascending weakness that resulted in inability to ambulate.  No other prodrome.  No clear precipitants although she was started on gabapentin about 1 month ago.  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  LP deferred initially as she was on Eliquis last dose 8/11.  IVIG started 8/12.  She has had progressive bilateral upper extremity weakness, some bulbar dysfunction difficulty swallowing, globus sensation with perception of swallowing and voice weakness.  FVC has been consistently greater than 1.0 L, NIF <-25.  She had an episode of desaturation, increased work of breathing and choking when eating Jell-O and pudding evening of 8/13.  Required intubation on 8/14.   Pertinent  Medical History   Past Medical History:  Diagnosis Date   Abnormal uterine bleeding    on HRT   Allergy    seasonal   Arthritis    Atrial fibrillation (HCC)    Celiac disease    DDD (degenerative disc disease)    Dysrhythmia    Glaucoma    Microscopic colitis    Osteoarthritis of both knees    PONV (postoperative nausea and vomiting)    SVT (supraventricular tachycardia) ([place])     Significant Hospital Events: Including procedures, antibiotic start and stop dates in addition to other pertinent events   IVIg started 8/12 CT CAP >> no dissection, bibasilar atelectasis, distended gallbladder with some pericholecystic stranding, segmental colitis question related to  diverticular disease RUQ ultrasound 8/12 >> distended gallbladder without stones or pericholecystic fluid no evidence cholecystitis MR cervical, thoracic, lumbar spine 8/12 > normal spinal cord no imaging evidence of demyelination or Guillain-Barr.  Advanced lumbar DJD, cervical DJD 8/13 Zosyn started for possible aspiration pneumonia 8/14: Intubated for progressive respiratory failure, poor bulbar function, and downtrending NIF and vital capacity sodium down to 115 from 124 but corrected to 119 with hyperglycemia.  115 was treated with hypertonic saline bolus.  IV sedating drips stopped. 8/15: IV sedating drips stopped, placed back on propofol in the evening hours.  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  Free water limited.  MRI neg.  8/16: day 5 of 5 IVIG.  Remains profoundly weak.  Tracheostomy,  Getting IV Lasix for volume overload.  [place] neg. 8/17: Remains in ICU, completed 5 days of zosyn.  8/18: started round 2 of  IVIG per neuro 8/19 fentanyl/ klonopin stopped (very sensitive/ too somnolent) 8/22 IVIG completed, strength slowly improving, tolerating trach collar intermittently during the day 8/25 trach collar 2hr BID.   Interim History / Subjective:   Having increased secretions but is coughing them up through the trach well On ATC  Brother and son at bedside   Objective   Blood pressure 127/77, pulse (!) 123, temperature 99.1 F (37.3 C), temperature source Oral, resp. rate (!) 42, height 5' 4"  (1.626 m), weight 61.3 kg, last menstrual period 04/20/2005, SpO2 100 %.    Vent Mode: PRVC FiO2 (%):  [40 %-45 %] 40 % Set Rate:  [16 bmp] 16  bmp Vt Set:  [430 mL] 430 mL PEEP:  [5 cmH20-7 cmH20] 5 cmH20 Plateau Pressure:  [12 cmH20] 12 cmH20   Intake/Output Summary (Last 24 hours) at 02/19/2021 1214 Last data filed at 02/19/2021 1137 Gross per 24 hour  Intake 810 ml  Output 1340 ml  Net -530 ml   Filed Weights   02/13/21 0302 02/17/21 0500 02/18/21 0500  Weight: 60.7  kg 61.8 kg 61.3 kg    General: Older adult F reclined in bed NAD  Neuro: Awake, fatigued following commands. Weak -- upper extremity stronger than lower.  HEENT: NCAT Trach secure. Moderate tan secretions  Cardiovascular: rr s1s2 no rgm  Lungs: Even unlabored on ATC. Scattered rhonchi upper lobes . Abdomen: Soft round ndnt + bowel sounds Musculoskeletal: No acute deformity no cyanosis or clubbing. BLE heel boots  Skin: pale, c/d/w/I no rash   Resolved Hospital Problem list   Circulatory shock. Pressor dependent. Did meet SIRS criteria so w/ PNA will call this a mix of septic shock and also medication related hypotension  Assessment & Plan:   Acute neuromuscular weakness, Presumed GBS - improving sx Received IVIG 2g/kg which was completed 8/16, then an additional 578m/kg on 8/18 Acetylcholine receptor studies were negative, MUSK antibodies also negative  Neuro did not recommend PLEX Acute encephalopathy, improved  -- likely component of ICU delirium  P -cont SLP PT efforts   Acute hypoxic respiratory failure Aspiration PNA s/p zosyn course  S/p tracheostomy  P: -BID ATC. Slowly extending duration of ATC trials -- currently 2hr BID  -continue VAP prevention and trach care  -pending pulm progression ?dispo plan. likely [facility] 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 Was hypotensive with dilt  -continue Lovenox -needs outpatient cardiology f/u  Hyperglycemia -Cont SSI  Dysphagia due to GBS - EN via cortrak -SLP   Glaucoma  - timolol   Best Practice (right click and "Reselect all SmartList Selections" daily)   Diet/type: TF DVT prophylaxis: prophylactic lovenox GI prophylaxis:  PPI Lines: N/A- PICC RUE Foley:  Yes, and it is still needed Code Status:  full code Last date of multidisciplinary goals of care discussion: full code, full recovery hoped for -Brother updated at the bedside on 8/24   CRITICAL CARE Performed by: GCristal Generous  Total critical care time: 41  minutes  Critical care time was exclusive of separately billable procedures and treating other patients. Critical care was necessary to treat or prevent imminent or life-threatening deterioration.  Critical care was time spent personally by me on the following activities: development of treatment plan with patient and/or surrogate as well as nursing, discussions with consultants, evaluation of patient's response to treatment, examination of patient, obtaining history from patient or surrogate, ordering and performing treatments and interventions, ordering and review of laboratory studies, ordering and review of radiographic studies, pulse oximetry and re-evaluation of patient's condition.  GEliseo GumMSN, AGACNP-BC LLake Kiowafor pager  02/19/2021, 12:14 PM

## 2021-02-19 NOTE — Progress Notes (Signed)
Pt refused CPT due to being tired form sitting up in chair today. RT will continue to monitor and watch.

## 2021-02-20 DIAGNOSIS — J9601 Acute respiratory failure with hypoxia: Secondary | ICD-10-CM | POA: Diagnosis not present

## 2021-02-20 DIAGNOSIS — J15211 Pneumonia due to Methicillin susceptible Staphylococcus aureus: Secondary | ICD-10-CM

## 2021-02-20 LAB — GLUCOSE, CAPILLARY
Glucose-Capillary: 101 mg/dL — ABNORMAL HIGH (ref 70–99)
Glucose-Capillary: 117 mg/dL — ABNORMAL HIGH (ref 70–99)
Glucose-Capillary: 128 mg/dL — ABNORMAL HIGH (ref 70–99)
Glucose-Capillary: 129 mg/dL — ABNORMAL HIGH (ref 70–99)
Glucose-Capillary: 129 mg/dL — ABNORMAL HIGH (ref 70–99)
Glucose-Capillary: 131 mg/dL — ABNORMAL HIGH (ref 70–99)
Glucose-Capillary: 134 mg/dL — ABNORMAL HIGH (ref 70–99)

## 2021-02-20 LAB — BASIC METABOLIC PANEL
Anion gap: 7 (ref 5–15)
BUN: 20 mg/dL (ref 8–23)
CO2: 25 mmol/L (ref 22–32)
Calcium: 8 mg/dL — ABNORMAL LOW (ref 8.9–10.3)
Chloride: 100 mmol/L (ref 98–111)
Creatinine, Ser: 0.32 mg/dL — ABNORMAL LOW (ref 0.44–1.00)
GFR, Estimated: >60 mL/min (ref >60)
Glucose, Bld: 117 mg/dL — ABNORMAL HIGH (ref 70–99)
Potassium: 3.9 mmol/L (ref 3.5–5.1)
Sodium: 132 mmol/L — ABNORMAL LOW (ref 135–145)

## 2021-02-20 LAB — CULTURE, RESPIRATORY W GRAM STAIN

## 2021-02-20 LAB — PROCALCITONIN: Procalcitonin: 0.28 ng/mL

## 2021-02-20 MED ORDER — CEFAZOLIN SODIUM-DEXTROSE 2-4 GM/100ML-% IV SOLN
2.0000 g | Freq: Three times a day (TID) | INTRAVENOUS | Status: DC
  Filled 2021-02-20 (×2): qty 100

## 2021-02-20 MED ORDER — CYCLOBENZAPRINE HCL 5 MG PO TABS
5.0000 mg | ORAL_TABLET | Freq: Two times a day (BID) | ORAL | Status: DC | PRN
  Filled 2021-02-20: qty 1

## 2021-02-20 MED ORDER — KETOROLAC TROMETHAMINE 15 MG/ML IJ SOLN: 15.0000 mg | Freq: Three times a day (TID) | INTRAMUSCULAR | Status: AC | PRN

## 2021-02-20 MED ORDER — CEFAZOLIN SODIUM-DEXTROSE 2-4 GM/100ML-% IV SOLN
2.0000 g | Freq: Three times a day (TID) | INTRAVENOUS | Status: AC
  Administered 2021-02-20 – 2021-02-26 (×21): 2 g via INTRAVENOUS
  Filled 2021-02-20 (×21): qty 100

## 2021-02-20 NOTE — Progress Notes (Signed)
Patient ID: Angela Morales, female   DOB: 28-Mar-1946, 75 y.o.   MRN: [id number]   NAME:  Angela Morales, MRN:  [id number], DOB:  08-02-1945, LOS: 48 ADMISSION DATE:  02/05/2021, CONSULTATION DATE:  02/07/21  REFERRING MD:  Dr Lupita Leash, CHIEF COMPLAINT:  tachypnea, hypoxemia   History of Present Illness:  75 year old woman with a history of atrial fibrillation (ablated 8/3), celiac disease, DDD who was admitted on 8/11 with bilateral upper and lower extremity weakness and paresthesias beginning about 8/8.  Developed some ascending weakness that resulted in inability to ambulate.  No other prodrome.  No clear precipitants although she was started on gabapentin about 1 month ago.  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  LP deferred initially as she was on Eliquis last dose 8/11.  IVIG started 8/12.  She has had progressive bilateral upper extremity weakness, some bulbar dysfunction difficulty swallowing, globus sensation with perception of swallowing and voice weakness.  FVC has been consistently greater than 1.0 L, NIF <-25.  She had an episode of desaturation, increased work of breathing and choking when eating Jell-O and pudding evening of 8/13.  Required intubation on 8/14.   Pertinent  Medical History   Past Medical History:  Diagnosis Date   Abnormal uterine bleeding    on HRT   Allergy    seasonal   Arthritis    Atrial fibrillation (HCC)    Celiac disease    DDD (degenerative disc disease)    Dysrhythmia    Glaucoma    Microscopic colitis    Osteoarthritis of both knees    PONV (postoperative nausea and vomiting)    SVT (supraventricular tachycardia) ([place])     Significant Hospital Events: Including procedures, antibiotic start and stop dates in addition to other pertinent events   IVIg started 8/12 CT CAP >> no dissection, bibasilar atelectasis, distended gallbladder with some pericholecystic stranding, segmental colitis question related to  diverticular disease RUQ ultrasound 8/12 >> distended gallbladder without stones or pericholecystic fluid no evidence cholecystitis MR cervical, thoracic, lumbar spine 8/12 > normal spinal cord no imaging evidence of demyelination or Guillain-Barr.  Advanced lumbar DJD, cervical DJD 8/13 Zosyn started for possible aspiration pneumonia 8/14: Intubated for progressive respiratory failure, poor bulbar function, and downtrending NIF and vital capacity sodium down to 115 from 124 but corrected to 119 with hyperglycemia.  115 was treated with hypertonic saline bolus.  IV sedating drips stopped. 8/15: IV sedating drips stopped, placed back on propofol in the evening hours.  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  Free water limited.  MRI neg.  8/16: day 5 of 5 IVIG.  Remains profoundly weak.  Tracheostomy,  Getting IV Lasix for volume overload.  Burtrum neg. 8/17: Remains in ICU, completed 5 days of zosyn.  8/18: started round 2 of  IVIG per neuro 8/19 fentanyl/ klonopin stopped (very sensitive/ too somnolent) 8/22 IVIG completed, strength slowly improving, tolerating trach collar intermittently during the day 8/25 trach collar 2hr BID. Staph and klebsiella in trach aspirate 8/26 feels weaker. Starting cefazolin   Interim History / Subjective:   Feels weaker today NAEO   Objective   Blood pressure 112/68, pulse (!) 106, temperature 97.6 F (36.4 C), temperature source Oral, resp. rate (!) 25, height 5' 4"  (1.626 m), weight 62.6 kg, last menstrual period 04/20/2005, SpO2 100 %.    Vent Mode: PRVC FiO2 (%):  [40 %] 40 % Set Rate:  [16 bmp] 16 bmp Vt Set:  [430 mL]  430 mL PEEP:  [5 cmH20] 5 cmH20 Plateau Pressure:  [14 cmH20-15 cmH20] 14 cmH20   Intake/Output Summary (Last 24 hours) at 02/20/2021 0911 Last data filed at 02/20/2021 0600 Gross per 24 hour  Intake 1065 ml  Output 765 ml  Net 300 ml   Filed Weights   02/17/21 0500 02/18/21 0500 02/20/21 0500  Weight: 61.8 kg 61.3 kg 62.6  kg    General: ill appearing older adult F reclined in bed NAD  Neuro: Awake alert Ox3 following commands. BUE stronger than BLE HEENT: NCAT Trach secure. Tan/green tracheal secretions Cardiovascular: irir s1s2 no rgm cap refill brisk  Lungs: Mechanically supported. Bilateral rhonchi. Symmetrical chest expansion  Abdomen: soft round ndnt + bowel sounds  Musculoskeletal: no acute deformity no cyanosis or clubbing  Skin: pale c/d/w no rash   Resolved Hospital Problem list   Circulatory shock. Pressor dependent. 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 Is having incr productive cough. With poor functional reserve think best to treat  P: -5d cefazolin starting 8/26   -Slowly progress BID ATC. (Currently doing 2hr BID)  -continue VAP prevention and trach care  -pending pulm progression ?dispo plan. likely will need LTACH (message sent to [facility] 8/23 regarding possibility of [facility] placement).  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 - EN via cortrak -SLP  -possible that pt may need PEG  Glaucoma  - timolol   Best Practice (right click and "Reselect all SmartList Selections" daily)    Diet/type: EN via cortrak DVT prophylaxis: prophylactic lovenox GI prophylaxis: PPI Lines: N/A- PICC RUE Foley:  Yes, and it is still needed Code Status:  full code Last date of multidisciplinary goals of care discussion: full code, full recovery hoped for -brother & son at bedside 8/25      CRITICAL CARE Performed by: Cristal Generous   Total critical care time: 38 minutes  Critical care time was exclusive of separately billable procedures and treating other patients. Critical care was necessary to treat or prevent imminent or life-threatening deterioration.  Critical care was time spent personally by me on the following activities: development of treatment plan with patient and/or surrogate as well as nursing, discussions with consultants, evaluation of patient's response to treatment, examination of patient, obtaining history from patient or surrogate, ordering and performing treatments and interventions, ordering and review of laboratory studies, ordering and review of radiographic studies, pulse oximetry and re-evaluation of patient's condition.   Eliseo Gum MSN, AGACNP-BC [place] for pager  02/20/2021, 9:11 AM

## 2021-02-20 NOTE — Progress Notes (Signed)
SLP Cancellation Note  Patient Details Name: Angela Morales MRN: [id number] DOB: Dec 30, 1945   Cancelled treatment:        Pt requested to defer PMV trials to "another day." She is not feeling well today and just finished PT/OT and needed to rest. SLP service to follow up as able.  Mikahla Wisor P. Felita Bump, M.S., CCC-SLP Speech-Language Pathologist Acute Rehabilitation Services Pager: [place] 02/20/2021, 11:23 AM

## 2021-02-20 NOTE — Progress Notes (Addendum)
Center Progress Note Patient Name: CAMIL HAUSMANN DOB: [birth year] MRN: [id number]   Date of Service  02/20/2021  HPI/Events of Note  Patient has acute urinary retention and she has failed the straight catheterization protocol. Bladder scan positive for > 300 ml of urine.  eICU Interventions  Foley catheter ordered.        Kerry Kass Percival Glasheen 02/20/2021, 11:00 PM

## 2021-02-20 NOTE — Progress Notes (Signed)
Occupational Therapy Treatment Patient Details Name: Angela Morales MRN: [id number] DOB: 10-09-1945 Today's Date: 02/20/2021    History of present illness Pt is a 75 y.o. female admitted 02/05/21 with c/o progressive BUE/BLE numbness and weakness; diagnosed with Guillain-Barr syndrome. Pt also with afib with RVR. Intubated 8/14 due to Acute respiratory failure with hypoxia w/ aspiration PNA and bibasilar atx.Marland Kitchen MRI negative 8/15. Trach placed 8/16. CT brain 8/16 negative.  PMH includes afib (recent ablation 01/28/21), celiac disease, DDD, gluacoma, L TKA (2017).   OT comments  Patient supine in bed and agreeable to OT/PT session.  Pt on trach collar at 40% FiO2, cleared with respiratory and RN for participation.  Demonstrating increased coordination and strength in [place], with full AROM during exercises without assist.  Able to complete tip to tip exercises bilaterally. She completed bed mobility with mod-max assist, demonstrating mod assist with BUE support sitting EOB but fatigues easily (and relies on BUE support). Reports "I do not feel safe" without significant hands on assist due to weakness. Continues to require total assist for LB ADLs and mod assist for UB ADLs. Will follow acutely.    Follow Up Recommendations  Supervision/Assistance - 24 hour;LTACH    Equipment Recommendations  Other (comment) (TBD)    Recommendations for Other Services      Precautions / Restrictions Precautions Precautions: Fall Precaution Comments: trach, cortrak       Mobility Bed Mobility Overal bed mobility: Needs Assistance Bed Mobility: Rolling;Sidelying to Sit;Sit to Sidelying Rolling: Mod assist Sidelying to sit: Max assist;HOB elevated     Sit to sidelying: Mod assist;+2 for physical assistance;+2 for safety/equipment General bed mobility comments: pt following cues to roll in bed requiring assist for LB, max asisst for trunk and LB support to ascend to EOB (able to push once positioned with R  UE)    Transfers                      Balance Overall balance assessment: Needs assistance Sitting-balance support: No upper extremity supported;Feet supported;Bilateral upper extremity supported Sitting balance-Leahy Scale: Poor Sitting balance - Comments: relies on BUE support mod assist, fatigues easily and without UE support total assist                                   ADL either performed or assessed with clinical judgement   ADL Overall ADL's : Needs assistance/impaired                 Upper Body Dressing : Bed level;Moderate assistance Upper Body Dressing Details (indicate cue type and reason): to change gown Lower Body Dressing: Total assistance;Sitting/lateral leans;Bed level               Functional mobility during ADLs: +2 for safety/equipment;+2 for physical assistance;Maximal assistance       Vision   Additional Comments: gaze conjugate, denies visual changes   Perception     Praxis      Cognition Arousal/Alertness: Awake/alert Behavior During Therapy: WFL for tasks assessed/performed Overall Cognitive Status: Difficult to assess                                 General Comments: pt mouthing words, good awareness to deficits and safety        Exercises Exercises: General Upper Extremity General Exercises - Upper Extremity  Shoulder Flexion: AROM;Both;Supine;10 reps Shoulder Extension: AROM;Both;Supine;10 reps   Shoulder Instructions       General Comments VSS on Trach collar 10L 40%    Pertinent Vitals/ Pain       Pain Assessment: Faces Faces Pain Scale: Hurts little more Pain Location: B LEs Pain Descriptors / Indicators: Discomfort;Grimacing;Pins and needles Pain Intervention(s): Limited activity within patient's tolerance;Monitored during session;Repositioned  Home Living                                          Prior Functioning/Environment               Frequency  Min 2X/week        Progress Toward Goals  OT Goals(current goals can now be found in the care plan section)  Progress towards OT goals: Progressing toward goals  Acute Rehab OT Goals Patient Stated Goal: get better. OT Goal Formulation: Patient unable to participate in goal setting  Plan Discharge plan remains appropriate;Frequency remains appropriate    Co-evaluation    PT/OT/SLP Co-Evaluation/Treatment: Yes Reason for Co-Treatment: Complexity of the patient's impairments (multi-system involvement)   OT goals addressed during session: ADL's and self-care      AM-PAC OT "6 Clicks" Daily Activity     Outcome Measure   Help from another person eating meals?: Total Help from another person taking care of personal grooming?: A Lot Help from another person toileting, which includes using toliet, bedpan, or urinal?: Total Help from another person bathing (including washing, rinsing, drying)?: A Lot Help from another person to put on and taking off regular upper body clothing?: A Lot Help from another person to put on and taking off regular lower body clothing?: Total 6 Click Score: 9    End of Session Equipment Utilized During Treatment: Oxygen  OT Visit Diagnosis: Other abnormalities of gait and mobility (R26.89);Muscle weakness (generalized) (M62.81);Pain;Other symptoms and signs involving cognitive function;Other symptoms and signs involving the nervous system (R29.898) Pain - Right/Left:  (bil) Pain - part of body: Leg   Activity Tolerance Patient tolerated treatment well   Patient Left in bed;with call bell/phone within reach;with bed alarm set;with family/visitor present   Nurse Communication Mobility status        Time: 1030-1101 OT Time Calculation (min): 31 min  Charges: OT General Charges $OT Visit: 1 Visit OT Treatments $Self Care/Home Management : 8-22 mins  Jolaine Artist, OT [place] Pager [phone] Office  [place] 02/20/2021, 1:17 PM

## 2021-02-20 NOTE — Progress Notes (Signed)
Physical Therapy Treatment Patient Details Name: Angela Morales MRN: [id number] DOB: [birth year] Today's Date: 02/20/2021    History of Present Illness Pt is a 75 y.o. female admitted 02/05/21 with c/o progressive BUE/BLE numbness and weakness; diagnosed with Guillain-Barr syndrome. Pt also with afib with RVR. Intubated 8/14 due to Acute respiratory failure with hypoxia w/ aspiration PNA and bibasilar atx.Marland Kitchen MRI negative 8/15. Trach placed 8/16. CT brain 8/16 negative.  PMH includes afib (recent ablation 01/28/21), celiac disease, DDD, gluacoma, L TKA (2017).    PT Comments    Pt continues to show notable functional improvements UE's> LE's, but significant.  Pt has contracted an infection with thicker secretions, but spending time on TC at 10 L, and 40% FiO2.  Emphasis on rolling, transitions to EOB and sitting balance propped on hands.  Also concentrated on ROM/exercises of LE and UE's    Follow Up Recommendations  CIR;Supervision for mobility/OOB;LTACH;Other (comment) (will revisit d/c Monday as pt appears to be improving visibly)     Equipment Recommendations  Other (comment) (TBD)    Recommendations for Other Services Rehab consult     Precautions / Restrictions Precautions Precautions: Fall Precaution Comments: trach, cortrak    Mobility  Bed Mobility Overal bed mobility: Needs Assistance Bed Mobility: Rolling;Sidelying to Sit;Sit to Sidelying Rolling: Mod assist Sidelying to sit: Max assist;HOB elevated     Sit to sidelying: Mod assist;+2 for physical assistance;+2 for safety/equipment General bed mobility comments: pt following cues to roll in bed requiring assist for LB, max asisst for trunk and LB support to ascend to EOB (able to push once positioned with R UE)    Transfers                 General transfer comment: pt state just too fatigued this early in the day and asked to return to bed.  Ambulation/Gait                 Stairs              Wheelchair Mobility    Modified Rankin (Stroke Patients Only)       Balance Overall balance assessment: Needs assistance Sitting-balance support: No upper extremity supported;Feet supported;Bilateral upper extremity supported Sitting balance-Leahy Scale: Poor Sitting balance - Comments: relies on BUE support mod assist, fatigues easily and without UE support total assist                                    Cognition Arousal/Alertness: Awake/alert Behavior During Therapy: WFL for tasks assessed/performed Overall Cognitive Status: Difficult to assess                                 General Comments: pt mouthing words, good awareness to deficits and safety      Exercises General Exercises - Upper Extremity Shoulder Flexion: AROM;Both;Supine;10 reps Shoulder Extension: AROM;Both;Supine;10 reps Other Exercises Other Exercises: heel cord stretch bil with/without knee flextion for 30 secs. Other Exercises: hip/knee flexion/ext  AA/graded assist/resistance both flex/ext x 10 reps Other Exercises: LAQ x10 bil in sitting    General Comments General comments (skin integrity, edema, etc.): vss on TC 10 L at 40% FiO2      Pertinent Vitals/Pain Pain Assessment: Faces Faces Pain Scale: Hurts little more Pain Location: B LEs Pain Descriptors / Indicators: Discomfort;Grimacing;Pins and needles Pain Intervention(s): Limited activity  within patient's tolerance    Home Living                      Prior Function            PT Goals (current goals can now be found in the care plan section) Acute Rehab PT Goals Patient Stated Goal: get better. PT Goal Formulation: With patient/family Time For Goal Achievement: 02/21/21 Potential to Achieve Goals: Good Progress towards PT goals: Progressing toward goals    Frequency    Min 4X/week      PT Plan Current plan remains appropriate    Co-evaluation PT/OT/SLP Co-Evaluation/Treatment:  Yes Reason for Co-Treatment: Complexity of the patient's impairments (multi-system involvement) PT goals addressed during session: Mobility/safety with mobility OT goals addressed during session: ADL's and self-care      AM-PAC PT "6 Clicks" Mobility   Outcome Measure  Help needed turning from your back to your side while in a flat bed without using bedrails?: A Lot Help needed moving from lying on your back to sitting on the side of a flat bed without using bedrails?: Total Help needed moving to and from a bed to a chair (including a wheelchair)?: Total Help needed standing up from a chair using your arms (e.g., wheelchair or bedside chair)?: Total Help needed to walk in hospital room?: Total Help needed climbing 3-5 steps with a railing? : Total 6 Click Score: 7    End of Session Equipment Utilized During Treatment: Oxygen Activity Tolerance: Patient tolerated treatment well;Patient limited by fatigue Patient left: in bed;with call bell/phone within reach;with bed alarm set;with SCD's reapplied;with family/visitor present Nurse Communication: Mobility status;Need for lift equipment PT Visit Diagnosis: Other abnormalities of gait and mobility (R26.89);Muscle weakness (generalized) (M62.81);Other symptoms and signs involving the nervous system (R29.898)     Time: 3710-6269 PT Time Calculation (min) (ACUTE ONLY): 32 min  Charges:  $Neuromuscular Re-education: 8-22 mins                     02/20/2021  Ginger Carne., PT Acute Rehabilitation Services [phone]  (pager) [phone]  (office)   Tessie Fass Amil Bouwman 02/20/2021, 1:47 PM

## 2021-02-21 DIAGNOSIS — J9601 Acute respiratory failure with hypoxia: Secondary | ICD-10-CM | POA: Diagnosis not present

## 2021-02-21 DIAGNOSIS — J15211 Pneumonia due to Methicillin susceptible Staphylococcus aureus: Secondary | ICD-10-CM | POA: Diagnosis not present

## 2021-02-21 LAB — BASIC METABOLIC PANEL
Anion gap: 8 (ref 5–15)
BUN: 15 mg/dL (ref 8–23)
CO2: 23 mmol/L (ref 22–32)
Calcium: 8 mg/dL — ABNORMAL LOW (ref 8.9–10.3)
Chloride: 97 mmol/L — ABNORMAL LOW (ref 98–111)
Creatinine, Ser: 0.3 mg/dL — ABNORMAL LOW (ref 0.44–1.00)
GFR, Estimated: >60 mL/min (ref >60)
Glucose, Bld: 123 mg/dL — ABNORMAL HIGH (ref 70–99)
Potassium: 4 mmol/L (ref 3.5–5.1)
Sodium: 128 mmol/L — ABNORMAL LOW (ref 135–145)

## 2021-02-21 LAB — GLUCOSE, CAPILLARY
Glucose-Capillary: 110 mg/dL — ABNORMAL HIGH (ref 70–99)
Glucose-Capillary: 146 mg/dL — ABNORMAL HIGH (ref 70–99)
Glucose-Capillary: 141 mg/dL — ABNORMAL HIGH (ref 70–99)
Glucose-Capillary: 131 mg/dL — ABNORMAL HIGH (ref 70–99)
Glucose-Capillary: 104 mg/dL — ABNORMAL HIGH (ref 70–99)
Glucose-Capillary: 127 mg/dL — ABNORMAL HIGH (ref 70–99)

## 2021-02-21 MED ORDER — GERHARDT'S BUTT CREAM
TOPICAL_CREAM | CUTANEOUS | Status: DC | PRN
  Administered 2021-02-22: 1 via TOPICAL
  Filled 2021-02-21: qty 1

## 2021-02-21 MED ORDER — BETHANECHOL CHLORIDE 10 MG PO TABS
10.0000 mg | ORAL_TABLET | Freq: Three times a day (TID) | ORAL | Status: DC
  Administered 2021-02-21 – 2021-03-02 (×28): 10 mg
  Filled 2021-02-21 (×28): qty 1

## 2021-02-21 NOTE — Progress Notes (Signed)
Per RN request, RT placed pt on trach collar 10L 40%. Pt tolerating well with SVS. RT will continue to monitor pt.

## 2021-02-21 NOTE — Progress Notes (Signed)
Pt is sitting in the chair, therefore CPT will not be done for 12. Will continue when pt is back in bed. RT will continue to monitor.

## 2021-02-21 NOTE — Plan of Care (Signed)

## 2021-02-21 NOTE — Progress Notes (Addendum)
[place] Progress Note Patient Name: Angela Morales DOB: 01-Sep-1945 MRN: [id number]   Date of Service  02/21/2021  HPI/Events of Note  Camera eval for low BP and new onset a flutter?  Discussed with RN. Trach suctioned and adjusted. Now MAP > 65 Sats good. ? A flutter .  GBS-trach.  eICU Interventions  -get EKG, electrolytes, replace if low.      Intervention Category Intermediate Interventions: Arrhythmia - evaluation and management  Elmer Sow 02/21/2021, 11:20 PM  23:41 EKG: a flutter variable block, rate 92, qtc < 450. - follow labs Cardiology consultation in AM.   00:44 8/13 a fib/flutter, recent ablation.  MAP soft still. Labs:co2 at 23, K 3.9, mag 2, po4 at 3.2 - LR bolus 500 once.

## 2021-02-21 NOTE — Progress Notes (Signed)
MD Prudencio Burly paged regarding patients rhythm change and hypotension. Order for labs and 12 lead ekg obtained. Ekg shows a flutter. BP currently 90/57 (68).

## 2021-02-21 NOTE — Progress Notes (Signed)
Patient ID: Angela Morales, female   DOB: 12/25/1945, 75 y.o.   MRN: [id number]   NAME:  Angela Morales, MRN:  [id number], DOB:  Oct 20, 1945, LOS: 62 ADMISSION DATE:  02/05/2021, CONSULTATION DATE:  02/07/21  REFERRING MD:  Dr Lupita Leash, CHIEF COMPLAINT:  tachypnea, hypoxemia   History of Present Illness:  75 year old woman with a history of atrial fibrillation (ablated 8/3), celiac disease, DDD who was admitted on 8/11 with bilateral upper and lower extremity weakness and paresthesias beginning about 8/8.  Developed some ascending weakness that resulted in inability to ambulate.  No other prodrome.  No clear precipitants although she was started on gabapentin about 1 month ago.  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  LP deferred initially as she was on Eliquis last dose 8/11.  IVIG started 8/12.  She has had progressive bilateral upper extremity weakness, some bulbar dysfunction difficulty swallowing, globus sensation with perception of swallowing and voice weakness.  FVC has been consistently greater than 1.0 L, NIF <-25.  She had an episode of desaturation, increased work of breathing and choking when eating Jell-O and pudding evening of 8/13.  Required intubation on 8/14.   Pertinent  Medical History   Past Medical History:  Diagnosis Date   Abnormal uterine bleeding    on HRT   Allergy    seasonal   Arthritis    Atrial fibrillation (HCC)    Celiac disease    DDD (degenerative disc disease)    Dysrhythmia    Glaucoma    Microscopic colitis    Osteoarthritis of both knees    PONV (postoperative nausea and vomiting)    SVT (supraventricular tachycardia) ([place])     Significant Hospital Events: Including procedures, antibiotic start and stop dates in addition to other pertinent events   IVIg started 8/12 CT CAP >> no dissection, bibasilar atelectasis, distended gallbladder with some pericholecystic stranding, segmental colitis question related to  diverticular disease RUQ ultrasound 8/12 >> distended gallbladder without stones or pericholecystic fluid no evidence cholecystitis MR cervical, thoracic, lumbar spine 8/12 > normal spinal cord no imaging evidence of demyelination or Guillain-Barr.  Advanced lumbar DJD, cervical DJD 8/13 Zosyn started for possible aspiration pneumonia 8/14: Intubated for progressive respiratory failure, poor bulbar function, and downtrending NIF and vital capacity sodium down to 115 from 124 but corrected to 119 with hyperglycemia.  115 was treated with hypertonic saline bolus.  IV sedating drips stopped. 8/15: IV sedating drips stopped, placed back on propofol in the evening hours.  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  Free water limited.  MRI neg.  8/16: day 5 of 5 IVIG.  Remains profoundly weak.  Tracheostomy,  Getting IV Lasix for volume overload.  [place] neg. 8/17: Remains in ICU, completed 5 days of zosyn.  8/18: started round 2 of  IVIG per neuro 8/19 fentanyl/ klonopin stopped (very sensitive/ too somnolent) 8/22 IVIG completed, strength slowly improving, tolerating trach collar intermittently during the day 8/25 trach collar 2hr BID. Staph and klebsiella in trach aspirate 8/26 feels weaker. Starting cefazolin   Interim History / Subjective:   Up in chair today, feels better.  Complains of numbness down both legs.   Objective   Blood pressure 131/83, pulse (!) 120, temperature 98.7 F (37.1 C), temperature source Oral, resp. rate (!) 24, height 5' 4"  (1.626 m), weight 62.6 kg, last menstrual period 04/20/2005, SpO2 100 %.    Vent Mode: PRVC FiO2 (%):  [40 %] 40 % Set Rate:  [  16 bmp] 16 bmp Vt Set:  [430 mL] 430 mL PEEP:  [5 cmH20] 5 cmH20 Plateau Pressure:  [12 cmH20-13 cmH20] 13 cmH20   Intake/Output Summary (Last 24 hours) at 02/21/2021 1330 Last data filed at 02/21/2021 1000 Gross per 24 hour  Intake 1525 ml  Output 2175 ml  Net -650 ml    Filed Weights   02/17/21 0500 02/18/21  0500 02/20/21 0500  Weight: 61.8 kg 61.3 kg 62.6 kg    General: ill appearing older adult F in chair.  Neuro: Awake alert Ox3 following commands. BUE stronger than BLE. Able to speak around trach with no dysarthria.  Unable to accomplish fine motor tasks.  HEENT: NCAT Trach secure. Clear sections. Able to expectorate to trach easily Cardiovascular: irir s1s2 no rgm cap refill brisk  Lungs: no distress off ventilator. Rhonchi bilaterally.  Musculoskeletal: no acute deformity no cyanosis or clubbing  Skin: pale c/d/w no rash   Resolved Hospital Problem list   Circulatory shock. Pressor dependent. Did meet SIRS criteria so w/ PNA will call this a mix of septic shock and also medication related hypotension Acute encephalopathy  SIADH  Assessment & Plan:   Critically ill due to acute hypoxic respiratory failure requiring mechanical ventilation due acute neuromuscular weakness from GBS GBS status post IVIG S. Aureus bronchitis.  Urinary retention.  Dysphagia HTN Celiac disease.  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  Plan:   - Actually progressing quite well but still has significant proximal weakness.  - Continue with slowly progressive trach collar trials to avoid over exertion.  Will try 3h bid today. Increase to 4h bid on Monday if doing well.  - Strength and clarity of speech encouraging and suggests that she might be able to swallow soon. Start swallowing trials once able to tolerate 6h or so off the ventilator. Hopefully she will not need PEG.  - Hyponatremia is asymptomatic - continue to observe - Treat for 5 days with cefazolin  Best Practice (right click and "Reselect all SmartList Selections" daily)   Diet/type: EN via cortrak DVT prophylaxis: prophylactic lovenox GI prophylaxis: PPI Lines: N/A- PICC RUE Foley:  Yes, and it is still needed Started Ureocholine for retention Code Status:  full code Last date of multidisciplinary goals of care discussion: full code, full  recovery expected. Daughter updated at the bedside.   CRITICAL CARE Performed by: Kipp Brood   Total critical care time: 35 minutes  Critical care time was exclusive of separately billable procedures and treating other patients. Critical care was necessary to treat or prevent imminent or life-threatening deterioration.  Critical care was time spent personally by me on the following activities: development of treatment plan with patient and/or surrogate as well as nursing, discussions with consultants, evaluation of patient's response to treatment, examination of patient, obtaining history from patient or surrogate, ordering and performing treatments and interventions, ordering and review of laboratory studies, ordering and review of radiographic studies, pulse oximetry and re-evaluation of patient's condition.  Kipp Brood, MD [facility] ICU Physician [place]  Pager: [phone] Or Epic Secure Chat After hours: [phone].  02/21/2021, 1:45 PM     02/21/2021, 1:30 PM

## 2021-02-22 DIAGNOSIS — J9601 Acute respiratory failure with hypoxia: Secondary | ICD-10-CM | POA: Diagnosis not present

## 2021-02-22 DIAGNOSIS — J15211 Pneumonia due to Methicillin susceptible Staphylococcus aureus: Secondary | ICD-10-CM | POA: Diagnosis not present

## 2021-02-22 LAB — BASIC METABOLIC PANEL
Anion gap: 7 (ref 5–15)
BUN: 17 mg/dL (ref 8–23)
CO2: 23 mmol/L (ref 22–32)
Calcium: 7.8 mg/dL — ABNORMAL LOW (ref 8.9–10.3)
Chloride: 99 mmol/L (ref 98–111)
Creatinine, Ser: <0.3 mg/dL — ABNORMAL LOW (ref 0.44–1.00)
Glucose, Bld: 143 mg/dL — ABNORMAL HIGH (ref 70–99)
Potassium: 3.9 mmol/L (ref 3.5–5.1)
Sodium: 129 mmol/L — ABNORMAL LOW (ref 135–145)

## 2021-02-22 LAB — CBC
HCT: 29.4 % — ABNORMAL LOW (ref 36.0–46.0)
Hemoglobin: 9.8 g/dL — ABNORMAL LOW (ref 12.0–15.0)
MCH: 32.2 pg (ref 26.0–34.0)
MCHC: 33.3 g/dL (ref 30.0–36.0)
MCV: 96.7 fL (ref 80.0–100.0)
Platelets: 279 10*3/uL (ref 150–400)
RBC: 3.04 MIL/uL — ABNORMAL LOW (ref 3.87–5.11)
RDW: 13.5 % (ref 11.5–15.5)
WBC: 8 10*3/uL (ref 4.0–10.5)
nRBC: 0 % (ref 0.0–0.2)

## 2021-02-22 LAB — GLUCOSE, CAPILLARY
Glucose-Capillary: 110 mg/dL — ABNORMAL HIGH (ref 70–99)
Glucose-Capillary: 114 mg/dL — ABNORMAL HIGH (ref 70–99)
Glucose-Capillary: 116 mg/dL — ABNORMAL HIGH (ref 70–99)
Glucose-Capillary: 119 mg/dL — ABNORMAL HIGH (ref 70–99)
Glucose-Capillary: 132 mg/dL — ABNORMAL HIGH (ref 70–99)
Glucose-Capillary: 139 mg/dL — ABNORMAL HIGH (ref 70–99)

## 2021-02-22 LAB — POTASSIUM: Potassium: 3.9 mmol/L (ref 3.5–5.1)

## 2021-02-22 LAB — PHOSPHORUS: Phosphorus: 3.2 mg/dL (ref 2.5–4.6)

## 2021-02-22 LAB — MAGNESIUM: Magnesium: 2 mg/dL (ref 1.7–2.4)

## 2021-02-22 MED ORDER — AMIODARONE HCL IN DEXTROSE 360-4.14 MG/200ML-% IV SOLN
30.0000 mg/h | INTRAVENOUS | Status: DC
  Administered 2021-02-23 – 2021-02-24 (×3): 30 mg/h via INTRAVENOUS
  Filled 2021-02-22 (×3): qty 200

## 2021-02-22 MED ORDER — AMIODARONE LOAD VIA INFUSION
150.0000 mg | Freq: Once | INTRAVENOUS | Status: AC
  Administered 2021-02-22: 150 mg via INTRAVENOUS
  Filled 2021-02-22: qty 83.34

## 2021-02-22 MED ORDER — METOPROLOL TARTRATE 50 MG PO TABS
50.0000 mg | ORAL_TABLET | Freq: Two times a day (BID) | ORAL | Status: DC
  Administered 2021-02-22 – 2021-02-24 (×5): 50 mg
  Filled 2021-02-22 (×5): qty 1

## 2021-02-22 MED ORDER — AMIODARONE HCL IN DEXTROSE 360-4.14 MG/200ML-% IV SOLN
60.0000 mg/h | INTRAVENOUS | Status: AC
  Administered 2021-02-22 (×2): 60 mg/h via INTRAVENOUS
  Filled 2021-02-22 (×2): qty 200

## 2021-02-22 MED ORDER — LACTATED RINGERS IV BOLUS
500.0000 mL | Freq: Once | INTRAVENOUS | Status: AC
  Administered 2021-02-22: 500 mL via INTRAVENOUS

## 2021-02-22 NOTE — Progress Notes (Signed)
Pt placed back on ventilator. Pt is tolerating well. RT will continue to monitor.

## 2021-02-22 NOTE — Progress Notes (Signed)
Patient ID: KAMARRI FISCHETTI, female   DOB: 02-Nov-1945, 75 y.o.   MRN: [id number]   NAME:  Angela Morales, MRN:  [id number], DOB:  [birth year], LOS: 77 ADMISSION DATE:  02/05/2021, CONSULTATION DATE:  02/07/21  REFERRING MD:  Dr Lupita Leash, CHIEF COMPLAINT:  tachypnea, hypoxemia   History of Present Illness:  75 year old woman with a history of atrial fibrillation (ablated 8/3), celiac disease, DDD who was admitted on 8/11 with bilateral upper and lower extremity weakness and paresthesias beginning about 8/8.  Developed some ascending weakness that resulted in inability to ambulate.  No other prodrome.  No clear precipitants although she was started on gabapentin about 1 month ago.  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  LP deferred initially as she was on Eliquis last dose 8/11.  IVIG started 8/12.  She has had progressive bilateral upper extremity weakness, some bulbar dysfunction difficulty swallowing, globus sensation with perception of swallowing and voice weakness.  FVC has been consistently greater than 1.0 L, NIF <-25.  She had an episode of desaturation, increased work of breathing and choking when eating Jell-O and pudding evening of 8/13.  Required intubation on 8/14.   Pertinent  Medical History   Past Medical History:  Diagnosis Date   Abnormal uterine bleeding    on HRT   Allergy    seasonal   Arthritis    Atrial fibrillation (HCC)    Celiac disease    DDD (degenerative disc disease)    Dysrhythmia    Glaucoma    Microscopic colitis    Osteoarthritis of both knees    PONV (postoperative nausea and vomiting)    SVT (supraventricular tachycardia) ([place])     Significant Hospital Events: Including procedures, antibiotic start and stop dates in addition to other pertinent events   IVIg started 8/12 CT CAP >> no dissection, bibasilar atelectasis, distended gallbladder with some pericholecystic stranding, segmental colitis question related to  diverticular disease RUQ ultrasound 8/12 >> distended gallbladder without stones or pericholecystic fluid no evidence cholecystitis MR cervical, thoracic, lumbar spine 8/12 > normal spinal cord no imaging evidence of demyelination or Guillain-Barr.  Advanced lumbar DJD, cervical DJD 8/13 Zosyn started for possible aspiration pneumonia 8/14: Intubated for progressive respiratory failure, poor bulbar function, and downtrending NIF and vital capacity sodium down to 115 from 124 but corrected to 119 with hyperglycemia.  115 was treated with hypertonic saline bolus.  IV sedating drips stopped. 8/15: IV sedating drips stopped, placed back on propofol in the evening hours.  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  Free water limited.  MRI neg.  8/16: day 5 of 5 IVIG.  Remains profoundly weak.  Tracheostomy,  Getting IV Lasix for volume overload.  [place] neg. 8/17: Remains in ICU, completed 5 days of zosyn.  8/18: started round 2 of  IVIG per neuro 8/19 fentanyl/ klonopin stopped (very sensitive/ too somnolent) 8/22 IVIG completed, strength slowly improving, tolerating trach collar intermittently during the day 8/25 trach collar 2hr BID. Staph and klebsiella in trach aspirate 8/26 feels weaker. Starting cefazolin  8/27 atrial flutter overnight.   Interim History / Subjective:   Tolerated 4h of trach collar this morning, but was tired at the end. Complains of poor sleep. Atrial flutter overnight.   Objective   Blood pressure (!) 129/99, pulse (!) 115, temperature 98.9 F (37.2 C), temperature source Oral, resp. rate (!) 24, height 5' 4"  (1.626 m), weight 62.6 kg, last menstrual period 04/20/2005, SpO2 100 %.  Vent Mode: PRVC FiO2 (%):  [40 %] 40 % Set Rate:  [16 bmp] 16 bmp Vt Set:  [430 mL] 430 mL PEEP:  [5 cmH20] 5 cmH20 Plateau Pressure:  [12 cmH20-13 cmH20] 12 cmH20   Intake/Output Summary (Last 24 hours) at 02/22/2021 1321 Last data filed at 02/22/2021 1300 Gross per 24 hour  Intake  2385.18 ml  Output 1165 ml  Net 1220.18 ml    Filed Weights   02/17/21 0500 02/18/21 0500 02/20/21 0500  Weight: 61.8 kg 61.3 kg 62.6 kg    General: ill appearing 75 year old woman lying in bed.   Neuro: Awake alert Ox3 following commands. BUE stronger than BLE.   HEENT: NCAT Trach secure. Clear sections. Able to expectorate to trach easily Cardiovascular: irir s1s2 no rgm cap refill brisk  Lungs: no distress on ventilator with clear lung.  Musculoskeletal: no acute deformity no cyanosis or clubbing  Skin: pale c/d/w no rash   Resolved Hospital Problem list   Circulatory shock. Pressor dependent. Did meet SIRS criteria so w/ PNA will call this a mix of septic shock and also medication related hypotension Acute encephalopathy  SIADH  Assessment & Plan:   Critically ill due to acute hypoxic respiratory failure requiring mechanical ventilation due acute neuromuscular weakness from GBS Intermittent Atrial flutter. GBS status post IVIG S. Aureus bronchitis.  Urinary retention.  Dysphagia HTN Celiac disease.  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  Plan:   - Amiodarone infusion to control flutter. Anticoagulate acutely in case she needs DCCV. - Actually progressing quite well but still has significant proximal weakness.  - Continue with slowly progressive trach collar trials to avoid over exertion.  Will try 3-4h bid today. Increase to 5h bid on Tuesday if doing well.  - Strength and clarity of speech encouraging and suggests that she might be able to swallow soon. Start swallowing trials once able to tolerate 6h or so off the ventilator. Hopefully she will not need PEG.  - Hyponatremia is asymptomatic - continue to observe - Treat for 5 days with cefazolin  Best Practice (right click and "Reselect all SmartList Selections" daily)   Diet/type: EN via cortrak DVT prophylaxis: prophylactic lovenox GI prophylaxis: PPI Lines: N/A- PICC RUE Foley:  Yes, and it is still needed  Started Ureocholine for retention Code Status:  full code Last date of multidisciplinary goals of care discussion: full code, full recovery expected. Daughter updated at the bedside.   CRITICAL CARE Performed by: Kipp Brood   Total critical care time: 35 minutes  Critical care time was exclusive of separately billable procedures and treating other patients. Critical care was necessary to treat or prevent imminent or life-threatening deterioration.  Critical care was time spent personally by me on the following activities: development of treatment plan with patient and/or surrogate as well as nursing, discussions with consultants, evaluation of patient's response to treatment, examination of patient, obtaining history from patient or surrogate, ordering and performing treatments and interventions, ordering and review of laboratory studies, ordering and review of radiographic studies, pulse oximetry and re-evaluation of patient's condition.  Kipp Brood, MD [facility] ICU Physician [place]  Pager: [phone] Or Epic Secure Chat After hours: [phone].  02/22/2021, 1:21 PM     02/22/2021, 1:21 PM

## 2021-02-22 NOTE — Progress Notes (Signed)
ANTICOAGULATION CONSULT NOTE  Pharmacy Consult for heparin Indication: atrial fibrillation  Allergies  Allergen Reactions   Gluten Meal Other (See Comments)    celiac disease   Cefdinir Rash    Patient Measurements: Height: 5' 4"  (162.6 cm) Weight: 62.6 kg (138 lb 0.1 oz) IBW/kg (Calculated) : 54.7 Heparin Dosing Weight: 64kg  Vital Signs: Temp: 98.9 F (37.2 C) (08/28 1155) Temp Source: Oral (08/28 1155) BP: 129/99 (08/28 1300) Pulse Rate: 115 (08/28 1300)  Labs: Recent Labs    02/20/21 0035 02/21/21 0028 02/21/21 2346 02/22/21 0107  HGB  --   --   --  9.8*  HCT  --   --   --  29.4*  PLT  --   --   --  279  CREATININE 0.32* 0.30* <0.30*  --      CrCl cannot be calculated (This lab value cannot be used to calculate CrCl because it is not a number: <0.30).   Medical History: Past Medical History:  Diagnosis Date   Abnormal uterine bleeding    on HRT   Allergy    seasonal   Arthritis    Atrial fibrillation (HCC)    Celiac disease    DDD (degenerative disc disease)    Dysrhythmia    Glaucoma    Microscopic colitis    Osteoarthritis of both knees    PONV (postoperative nausea and vomiting)    SVT (supraventricular tachycardia) [facility])     Assessment: 75 year old female with history of afib on apixaban prior to admit. She is here with respiratory failure due to suspected GBS. Last dose of apixaban appears to be prior to admission on 8/11 at 0830. Bridging with heparin and changed to lovenox on 8/22.   Undergoing trach trials and may need a PEG placed based on progress. Scr <0.3 (stable). Hgb 9.8, plt 279. No s/sx of bleeding reported.  Goal of Therapy:  Monitor platelets by anticoagulation protocol: Yes   Plan:  -Continue lovenox 1m [place] q12h -CBC every 3 days  KAntonietta Jewel PharmD, BCCCP Clinical Pharmacist  Phone: [phone]/28/2022 1:52 PM  Please check AMION for all MBroadwayphone numbers After 10:00 PM, call [place] [phone]

## 2021-02-22 NOTE — Progress Notes (Signed)
RT placed pt on trach collar 10L 40%. Pt tolerating well. RT will continue to monitor pt.

## 2021-02-22 NOTE — Progress Notes (Signed)
Pt placed back on ventilator per MD order for ATC BID. Pt is tolerating well. RT will continue to monitor.

## 2021-02-22 NOTE — Progress Notes (Signed)
  Amiodarone Drug - Drug Interaction Consult Note  Recommendations: On enoxaparin and metoprolol currently - no adjustments at this time. On seroquel 25 mg qhs - will monitor Qtc (last checked 8/27 and found to be 469 in Afib). Will monitor vitals and for any future medication interactions.   Amiodarone is metabolized by the cytochrome P450 system and therefore has the potential to cause many drug interactions. Amiodarone has an average plasma half-life of 50 days (range 20 to 100 days).   There is potential for drug interactions to occur several weeks or months after stopping treatment and the onset of drug interactions may be slow after initiating amiodarone.   []  Statins: Increased risk of myopathy. Simvastatin- restrict dose to 58m daily. Other statins: counsel patients to report any muscle pain or weakness immediately.  [x]  Anticoagulants: Amiodarone can increase anticoagulant effect. Consider warfarin dose reduction. Patients should be monitored closely and the dose of anticoagulant altered accordingly, remembering that amiodarone levels take several weeks to stabilize.  []  Antiepileptics: Amiodarone can increase plasma concentration of phenytoin, the dose should be reduced. Note that small changes in phenytoin dose can result in large changes in levels. Monitor patient and counsel on signs of toxicity.  [x]  Beta blockers: increased risk of bradycardia, AV block and myocardial depression. Sotalol - avoid concomitant use.  []   Calcium channel blockers (diltiazem and verapamil): increased risk of bradycardia, AV block and myocardial depression.  []   Cyclosporine: Amiodarone increases levels of cyclosporine. Reduced dose of cyclosporine is recommended.  []  Digoxin dose should be halved when amiodarone is started.  []  Diuretics: increased risk of cardiotoxicity if hypokalemia occurs.  []  Oral hypoglycemic agents (glyburide, glipizide, glimepiride): increased risk of hypoglycemia.  Patient's glucose levels should be monitored closely when initiating amiodarone therapy.   [x]  Drugs that prolong the QT interval:  Torsades de pointes risk may be increased with concurrent use - avoid if possible.  Monitor QTc, also keep magnesium/potassium WNL if concurrent therapy can't be avoided.  Antibiotics: e.g. fluoroquinolones, erythromycin.  Antiarrhythmics: e.g. quinidine, procainamide, disopyramide, sotalol.  Antipsychotics: e.g. phenothiazines, haloperidol.   Lithium, tricyclic antidepressants, and methadone.  Thank You,  KAntonietta Jewel PharmD, BMiltonClinical Pharmacist  Phone: [phone]/28/2022 1:55 PM  Please check AMION for all [place] numbers After 10:00 PM, call [phone]

## 2021-02-22 NOTE — Progress Notes (Signed)
SLP Cancellation Note  Patient Details Name: Angela Morales MRN: [id number] DOB: 1946/05/20   Cancelled treatment:       Reason Eval/Treat Not Completed: Medical issues which prohibited therapy;Other (comment) (Patient placed back on ventilator. SLP will continue to follow patient.)   Sonia Baller, MA, CCC-SLP Speech Therapy [facility] Acute Rehab

## 2021-02-23 DIAGNOSIS — J9601 Acute respiratory failure with hypoxia: Secondary | ICD-10-CM | POA: Diagnosis not present

## 2021-02-23 DIAGNOSIS — G610 Guillain-Barre syndrome: Secondary | ICD-10-CM | POA: Diagnosis not present

## 2021-02-23 DIAGNOSIS — I4891 Unspecified atrial fibrillation: Secondary | ICD-10-CM | POA: Diagnosis not present

## 2021-02-23 DIAGNOSIS — Z930 Tracheostomy status: Secondary | ICD-10-CM

## 2021-02-23 LAB — GLUCOSE, CAPILLARY
Glucose-Capillary: 105 mg/dL — ABNORMAL HIGH (ref 70–99)
Glucose-Capillary: 116 mg/dL — ABNORMAL HIGH (ref 70–99)
Glucose-Capillary: 118 mg/dL — ABNORMAL HIGH (ref 70–99)
Glucose-Capillary: 124 mg/dL — ABNORMAL HIGH (ref 70–99)
Glucose-Capillary: 130 mg/dL — ABNORMAL HIGH (ref 70–99)
Glucose-Capillary: 134 mg/dL — ABNORMAL HIGH (ref 70–99)
Glucose-Capillary: 136 mg/dL — ABNORMAL HIGH (ref 70–99)

## 2021-02-23 LAB — BASIC METABOLIC PANEL
Anion gap: 7 (ref 5–15)
BUN: 15 mg/dL (ref 8–23)
CO2: 23 mmol/L (ref 22–32)
Calcium: 7.7 mg/dL — ABNORMAL LOW (ref 8.9–10.3)
Chloride: 97 mmol/L — ABNORMAL LOW (ref 98–111)
Creatinine, Ser: <0.3 mg/dL — ABNORMAL LOW (ref 0.44–1.00)
Glucose, Bld: 126 mg/dL — ABNORMAL HIGH (ref 70–99)
Potassium: 4.2 mmol/L (ref 3.5–5.1)
Sodium: 127 mmol/L — ABNORMAL LOW (ref 135–145)

## 2021-02-23 LAB — MAGNESIUM: Magnesium: 1.9 mg/dL (ref 1.7–2.4)

## 2021-02-23 MED ORDER — MELATONIN 3 MG PO TABS
3.0000 mg | ORAL_TABLET | Freq: Every day | ORAL | Status: DC
  Administered 2021-02-23: 3 mg via ORAL
  Filled 2021-02-23: qty 1

## 2021-02-23 MED ORDER — OSMOLITE 1.5 CAL PO LIQD
1000.0000 mL | ORAL | Status: DC
  Administered 2021-02-23 – 2021-03-01 (×7): 1000 mL

## 2021-02-23 MED ORDER — LACTATED RINGERS IV BOLUS
250.0000 mL | Freq: Once | INTRAVENOUS | Status: AC
  Administered 2021-02-23: 250 mL via INTRAVENOUS

## 2021-02-23 MED ORDER — QUETIAPINE FUMARATE 25 MG PO TABS
12.5000 mg | ORAL_TABLET | Freq: Every day | ORAL | Status: DC
  Administered 2021-02-23: 12.5 mg
  Filled 2021-02-23 (×2): qty 1

## 2021-02-23 NOTE — Progress Notes (Signed)
Physical Therapy Treatment Patient Details Name: Angela Morales MRN: [id number] DOB: 01-May-1946 Today's Date: 02/23/2021    History of Present Illness Pt is a 75 y.o. female admitted 02/05/21 with c/o progressive BUE/BLE numbness and weakness; diagnosed with Guillain-Barr syndrome. Pt also with afib with RVR. Intubated 8/14 due to Acute respiratory failure with hypoxia w/ aspiration PNA and bibasilar atx.Marland Kitchen MRI negative 8/15. Trach placed 8/16. CT brain 8/16 negative.  PMH includes afib (recent ablation 01/28/21), celiac disease, DDD, gluacoma, L TKA (2017).    PT Comments    The pt continues to make great progress with mobility given slow but steady recovery of strength and improved endurance. Though deficits remain in strength, activity tolerance, and both static and dynamic stability, the pt was able to demo good initiation of movements in all extremities with less assist at this time. The pt was also able to complete multiple sit-stand transfers with use of stedy and maxA of 2-3 to safely rise and maintain upright position. The pt will continue to benefit from skilled PT to progress core strength/stability for seated and standing balance, LE strength and power to facilitate OOB transfers, and continues to be appropriate for CIR therapies at d/c.     Follow Up Recommendations  CIR;Supervision for mobility/OOB;LTACH;Other (comment)     Equipment Recommendations  Other (comment)    Recommendations for Other Services       Precautions / Restrictions Precautions Precautions: Fall Precaution Comments: trach, cortrak Restrictions Weight Bearing Restrictions: No    Mobility  Bed Mobility Overal bed mobility: Needs Assistance Bed Mobility: Rolling;Sidelying to Sit;Sit to Sidelying Rolling: Min assist Sidelying to sit: Max assist;HOB elevated   Sit to supine: Total assist;+2 for physical assistance   General bed mobility comments: minA to complete rolling in bed as long as pt given  increased time to complete and use of bed rails. pt needing modA to push to sitting EOB, but is able to demo good attempt to assist. pt able to initiate return to supine, but requires maxA to bring BLE into bed    Transfers Overall transfer level: Needs assistance Equipment used: Ambulation equipment used Transfers: Sit to/from Stand Sit to Stand: Max assist;+2 physical assistance;+2 safety/equipment (+3 to monitor foot/ankle position)         General transfer comment: pt able to complete with use of stedy. pulling with BUE well, maxA of 2 to initiate hip clearance with use of bed pad. pt then able to elevate at trunk and hips to improve posture/upright position, tolerated standing for 3-8 seconds. VSS  Ambulation/Gait             General Gait Details: Unable to assess       Balance Overall balance assessment: Needs assistance Sitting-balance support: No upper extremity supported;Feet supported;Bilateral upper extremity supported Sitting balance-Leahy Scale: Poor Sitting balance - Comments: pt able to complete with moments of minG, increased assist to allow pt to recover when fatigued. BUE support on knees or bed in addition to max cues for use of core/UE. with minor LOB laterally, pt able to move UE to catch herself without prompting                                    Cognition Arousal/Alertness: Awake/alert Behavior During Therapy: [facility] for tasks assessed/performed Overall Cognitive Status: Within Functional Limits for tasks assessed  General Comments: pt able to vocalize this morning, following all commands but still needing slightly increased time due more to weakness than processing deficits. still benefits from slight cueing for use of DME/technique      Exercises      General Comments General comments (skin integrity, edema, etc.): VSS on trach collar, 10L, 40% FiO2 during session      Pertinent  Vitals/Pain Pain Assessment: Faces Faces Pain Scale: Hurts little more Pain Location: R calf (cramp with initial stand) Pain Descriptors / Indicators: Discomfort;Grimacing;Cramping Pain Intervention(s): Limited activity within patient's tolerance;Monitored during session;Repositioned     PT Goals (current goals can now be found in the care plan section) Acute Rehab PT Goals Patient Stated Goal: get better. PT Goal Formulation: With patient/family Time For Goal Achievement: 02/21/21 Potential to Achieve Goals: Good Progress towards PT goals: Progressing toward goals    Frequency    Min 4X/week      PT Plan Current plan remains appropriate    Co-evaluation PT/OT/SLP Co-Evaluation/Treatment: Yes Reason for Co-Treatment: Complexity of the patient's impairments (multi-system involvement);Necessary to address cognition/behavior during functional activity;For patient/therapist safety;To address functional/ADL transfers PT goals addressed during session: Mobility/safety with mobility;Balance;Proper use of DME        AM-PAC PT "6 Clicks" Mobility   Outcome Measure  Help needed turning from your back to your side while in a flat bed without using bedrails?: A Lot Help needed moving from lying on your back to sitting on the side of a flat bed without using bedrails?: A Lot Help needed moving to and from a bed to a chair (including a wheelchair)?: Total Help needed standing up from a chair using your arms (e.g., wheelchair or bedside chair)?: A Lot Help needed to walk in hospital room?: Total Help needed climbing 3-5 steps with a railing? : Total 6 Click Score: 9    End of Session Equipment Utilized During Treatment: Oxygen Activity Tolerance: Patient tolerated treatment well;Patient limited by fatigue Patient left: in bed;with call bell/phone within reach;with bed alarm set;with SCD's reapplied;with family/visitor present Nurse Communication: Mobility status;Need for lift  equipment PT Visit Diagnosis: Other abnormalities of gait and mobility (R26.89);Muscle weakness (generalized) (M62.81);Other symptoms and signs involving the nervous system (R29.898) Hemiplegia - caused by: Unspecified     Time: 7169-6789 PT Time Calculation (min) (ACUTE ONLY): 36 min  Charges:  $Therapeutic Activity: 8-22 mins                     West Carbo, PT, DPT   Acute Rehabilitation Department Pager #: [phone]   Sandra Cockayne 02/23/2021, 1:19 PM

## 2021-02-23 NOTE — Progress Notes (Signed)
Patient ID: Angela Morales, female   DOB: 1945-12-28, 75 y.o.   MRN: [id number]   NAME:  Angela Morales, MRN:  [id number], DOB:  October 25, 1945, LOS: 60 ADMISSION DATE:  02/05/2021, CONSULTATION DATE:  02/07/21  REFERRING MD:  Dr Lupita Leash, CHIEF COMPLAINT:  tachypnea, hypoxemia   History of Present Illness:  75 year old woman with a history of atrial fibrillation (ablated 8/3), celiac disease, DDD who was admitted on 8/11 with bilateral upper and lower extremity weakness and paresthesias beginning about 8/8.  Developed some ascending weakness that resulted in inability to ambulate.  No other prodrome.  No clear precipitants although she was started on gabapentin about 1 month ago.  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  LP deferred initially as she was on Eliquis last dose 8/11.  IVIG started 8/12.  She has had progressive bilateral upper extremity weakness, some bulbar dysfunction difficulty swallowing, globus sensation with perception of swallowing and voice weakness.  FVC has been consistently greater than 1.0 L, NIF <-25.  She had an episode of desaturation, increased work of breathing and choking when eating Jell-O and pudding evening of 8/13.  Required intubation on 8/14.   Pertinent  Medical History   Past Medical History:  Diagnosis Date   Abnormal uterine bleeding    on HRT   Allergy    seasonal   Arthritis    Atrial fibrillation (HCC)    Celiac disease    DDD (degenerative disc disease)    Dysrhythmia    Glaucoma    Microscopic colitis    Osteoarthritis of both knees    PONV (postoperative nausea and vomiting)    SVT (supraventricular tachycardia) ([place])     Significant Hospital Events: Including procedures, antibiotic start and stop dates in addition to other pertinent events   IVIg started 8/12 CT CAP >> no dissection, bibasilar atelectasis, distended gallbladder with some pericholecystic stranding, segmental colitis question related to  diverticular disease RUQ ultrasound 8/12 >> distended gallbladder without stones or pericholecystic fluid no evidence cholecystitis MR cervical, thoracic, lumbar spine 8/12 > normal spinal cord no imaging evidence of demyelination or Guillain-Barr.  Advanced lumbar DJD, cervical DJD 8/13 Zosyn started for possible aspiration pneumonia 8/14: Intubated for progressive respiratory failure, poor bulbar function, and downtrending NIF and vital capacity sodium down to 115 from 124 but corrected to 119 with hyperglycemia.  115 was treated with hypertonic saline bolus.  IV sedating drips stopped. 8/15: IV sedating drips stopped, placed back on propofol in the evening hours.  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  Free water limited.  MRI neg.  8/16: day 5 of 5 IVIG.  Remains profoundly weak.  Tracheostomy,  Getting IV Lasix for volume overload.  [place] neg. 8/17: Remains in ICU, completed 5 days of zosyn.  8/18: started round 2 of  IVIG per neuro 8/19 fentanyl/ klonopin stopped (very sensitive/ too somnolent) 8/22 IVIG completed, strength slowly improving, tolerating trach collar intermittently during the day 8/25 trach collar 2hr BID. Staph and klebsiella in trach aspirate 8/26 feels weaker. Starting cefazolin  8/27 atrial flutter overnight.   Interim History / Subjective:  Angela Morales denies complaints. This morning she is on trach collar. She has not yet been able to work with SLP due to timing when she has been off the vent. Her only complaint today is wanting coffee.  Objective   Blood pressure 100/77, pulse 92, temperature 98.2 F (36.8 C), temperature source Axillary, resp. rate (!) 23, height 5' 4"  (1.626  m), weight 62.6 kg, last menstrual period 04/20/2005, SpO2 100 %.    Vent Mode: PRVC FiO2 (%):  [40 %] 40 % Set Rate:  [16 bmp] 16 bmp Vt Set:  [430 mL] 430 mL PEEP:  [5 cmH20] 5 cmH20 Plateau Pressure:  [12 cmH20-16 cmH20] 15 cmH20   Intake/Output Summary (Last 24 hours) at  02/23/2021 0844 Last data filed at 02/23/2021 0800 Gross per 24 hour  Intake 2385.48 ml  Output 1438 ml  Net 947.48 ml    Filed Weights   02/18/21 0500 02/20/21 0500 02/23/21 0500  Weight: 61.3 kg 62.6 kg 62.6 kg    General: Ill appearing woman lying in bed on trach collar in NAD Neuro: Awake and alert, legs still barely able to be moved against gravity, but able to lift her arms off the bed. Moderate strength voice when talking even without PMV on TC. Answering questions appropriately, tracking with her eyes. HEENT: [place]/AT, eyes anicteric, oral mucosa moist. Cardiovascular: S1S2, irreg rhythm, reg rate. Lungs: CTAB, mild tachypnea, able to cough up secretions moderately well. Moderate thick secretions. Musculoskeletal: no c/c, no peripheral edema Skin: pale, no rashes  Resolved Hospital Problem list   Circulatory shock. Pressor dependent. Did meet SIRS criteria so w/ PNA will call this a mix of septic shock and also medication related hypotension Acute encephalopathy  SIADH  Assessment & Plan:   Acute respiratory failure with hypoxia due to NM weakness from GBS S. Aureus pneumonia- RLL -Con't cefazolin; complete 7 days -Con't trach collar trials to help with vent weaning. Rest on PRVC 4-8cc/kg IBW overnight and during the day as needed. 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 A1c 5.0. -SSI PRN -goal BG <180  Anxiety Disturbed sleep wake cycles -lorazepam PRN -decrease seroquel, start melatonin at bedtime  Glaucoma -con't timolol drops   Ms. Rabe and her son were updated at bedside today.  Best Practice (right click and "Reselect all SmartList Selections" daily)   Diet/type: EN via cortrak DVT prophylaxis:  full dose lovenox GI prophylaxis: PPI Lines: N/A, prev PICC removed Foley:  Yes, and it is still needed Started Ureocholine for retention Code Status:  full code Last date of multidisciplinary goals of care discussion: full code, full recovery expected.   This patient is critically ill with multiple organ system failure which requires frequent high complexity decision making, assessment, support, evaluation, and titration of therapies. This was completed through the application of advanced monitoring technologies and extensive interpretation of multiple databases. During this encounter critical care time was devoted to patient care services described in this note for 37 minutes.  Julian Hy, DO 02/23/21 9:01 AM [facility]

## 2021-02-23 NOTE — Progress Notes (Signed)
  Speech Language Pathology Treatment: [name] Speaking valve  Patient Details Name: Angela Morales MRN: [id number] DOB: 1945/12/20 Today's Date: 02/23/2021 Time: 1140-1200 SLP Time Calculation (min) (ACUTE ONLY): 20 min  Assessment / Plan / Recommendation Clinical Impression  Pt demonstrates improved tolerance of PMSV. Able to wear for 5 minutes with clear conversation speech. Pt oriented, able to discuss plan of care, explain situation, etc. Pt to wear PMSV with RN initial placement (given cuffed trach and pt on and off vent), but family may supervise and remove valve as needed. Son practiced removing valve. Will f/u for readiness for swallowing therapy. MD note states time to start when pt able to stay 6 hours on trach collar.   HPI HPI: BEATRIC FULOP is a 75 y.o. female who presented with complaint of weakness and numbness in arms and legs. Concern for GBS, s/p IVIG completed 8/16. Initial BSE 8/13 with recommendation for full liquid diet. GI consult that day with concern for more neurological dysphagia vs esophageal. Later that day pt had a suspected aspiration event with jello. She was intubated 8/13 and trached 8/16. PMH significant for atrial fibrillation (on anticoagulation, recently s/p ablation on 8/3 after failed cardioversion on 10/30/2020), celiac disease, degenerative disc disease.      SLP Plan  Continue with current plan of care       Recommendations         Patient may use Passy-Muir Speech Valve: Intermittently with supervision;During all therapies with supervision PMSV Supervision: Full         General recommendations: Rehab consult Oral Care Recommendations: Oral care QID Follow up Recommendations: Inpatient Rehab Plan: Continue with current plan of care       GO                Evans Levee, Katherene Ponto 02/23/2021, 2:42 PM

## 2021-02-23 NOTE — Progress Notes (Signed)
Occupational Therapy Treatment Patient Details Name: Angela Morales MRN: [id number] DOB: 11-14-1945 Today's Date: 02/23/2021    History of present illness Pt is a 75 y.o. female admitted 02/05/21 with c/o progressive BUE/BLE numbness and weakness; diagnosed with Guillain-Barr syndrome. Pt also with afib with RVR. Intubated 8/14 due to Acute respiratory failure with hypoxia w/ aspiration PNA and bibasilar atx.Marland Kitchen MRI negative 8/15. Trach placed 8/16. CT brain 8/16 negative.  PMH includes afib (recent ablation 01/28/21), celiac disease, DDD, gluacoma, L TKA (2017).   OT comments  Patient supine in bed and agreeable to OT/PT session.  Progressing well with increased strength and coordination of BUES, completed exercises below and provided squeeze ball for strengthening.  Pt completed bed mobility with max assist, sitting EOB at best min guard assist and reports feeling increased control today.  Requires total assist for LB dressing and toileting due to incontinence, min assist for UB dressing. Continues to fatigue easily, but highly motivated.  VSS on trach collar at 10L 40% FiO2.  Will follow acutely.    Follow Up Recommendations  Supervision/Assistance - 24 hour;LTACH;CIR (progressing towards CIR if able to wean fully from vent)    Equipment Recommendations  Other (comment) (TBD)    Recommendations for Other Services      Precautions / Restrictions Precautions Precautions: Fall Precaution Comments: trach, cortrak Restrictions Weight Bearing Restrictions: No       Mobility Bed Mobility Overal bed mobility: Needs Assistance Bed Mobility: Rolling;Sidelying to Sit;Sit to Sidelying Rolling: Min assist Sidelying to sit: Max assist;HOB elevated   Sit to supine: Total assist;+2 for physical assistance   General bed mobility comments: increased time required with min assist to roll using rails, max assist for full LB and trunk to ascend from sitting; total assist to return supine to to  fatigue but good control of trunk    Transfers Overall transfer level: Needs assistance Equipment used: Ambulation equipment used Transfers: Sit to/from Stand Sit to Stand: Max assist;+2 physical assistance;+2 safety/equipment (+3 to monitor foot/ankle position)         General transfer comment: pt able to complete with use of stedy. pulling with BUE well, maxA of 2 to initiate hip clearance with use of bed pad. pt then able to elevate at trunk and hips to improve posture/upright position, tolerated standing for 3-8 seconds. VSS    Balance Overall balance assessment: Needs assistance Sitting-balance support: No upper extremity supported;Feet supported;Bilateral upper extremity supported Sitting balance-Leahy Scale: Poor Sitting balance - Comments: pt able to complete with moments of minG, increased assist to allow pt to recover when fatigued. BUE support on knees or bed in addition to max cues for use of core/UE. with minor LOB laterally, pt able to move UE to catch herself without prompting     Standing balance-Leahy Scale: Zero                             ADL either performed or assessed with clinical judgement   ADL Overall ADL's : Needs assistance/impaired     Grooming: Sitting;Minimal assistance Grooming Details (indicate cue type and reason): sitting supported         Upper Body Dressing : Minimal assistance;Bed level Upper Body Dressing Details (indicate cue type and reason): able to thread BUEs into new gown with increased time Lower Body Dressing: Total assistance;+2 for physical assistance;+2 for safety/equipment;Sit to/from stand Lower Body Dressing Details (indicate cue type and reason): assist for socks  Toileting- Clothing Manipulation and Hygiene: +2 for physical assistance;Total assistance;+2 for safety/equipment;Bed level       Functional mobility during ADLs: Maximal assistance;Total assistance;+2 for physical assistance;+2 for  safety/equipment General ADL Comments: remains limited by decreased activity tolerance and endurance, requires increased time and rest breaks     Vision       Perception     Praxis      Cognition Arousal/Alertness: Awake/alert Behavior During Therapy: WFL for tasks assessed/performed Overall Cognitive Status: Within Functional Limits for tasks assessed                                 General Comments: pt able to vocalize this morning, following all commands but still needing slightly increased time due more to weakness than processing deficits. still benefits from slight cueing for use of DME/technique        Exercises Exercises: General Upper Extremity;Other exercises General Exercises - Upper Extremity Shoulder Flexion: AROM;Both;Supine;5 reps Shoulder Extension: AROM;Both;Supine;5 reps Elbow Flexion: AROM;Both;5 reps;Supine Elbow Extension: AROM;Both;5 reps;Supine Digit Composite Flexion: AROM;Both;5 reps;Supine Composite Extension: AROM;5 reps;Both;Supine Other Exercises Other Exercises: forward press x 5 reps 1 set Other Exercises: issued squeeze balls and encouarged hand pumps due to edema   Shoulder Instructions       General Comments VSS on trach collar, 10L, 40% FiO2 during session    Pertinent Vitals/ Pain       Pain Assessment: Faces Faces Pain Scale: Hurts little more Pain Location: R calf (cramp with initial stand) Pain Descriptors / Indicators: Discomfort;Grimacing;Cramping Pain Intervention(s): Limited activity within patient's tolerance;Monitored during session;Repositioned  Home Living                                          Prior Functioning/Environment              Frequency  Min 2X/week        Progress Toward Goals  OT Goals(current goals can now be found in the care plan section)  Progress towards OT goals: Progressing toward goals  Acute Rehab OT Goals Patient Stated Goal: get better. OT Goal  Formulation: With patient Time For Goal Achievement: 03/09/21 Potential to Achieve Goals: Good  Plan Discharge plan remains appropriate;Frequency remains appropriate    Co-evaluation    PT/OT/SLP Co-Evaluation/Treatment: Yes Reason for Co-Treatment: Complexity of the patient's impairments (multi-system involvement);For patient/therapist safety;To address functional/ADL transfers PT goals addressed during session: Mobility/safety with mobility;Balance;Proper use of DME OT goals addressed during session: ADL's and self-care      AM-PAC OT "6 Clicks" Daily Activity     Outcome Measure   Help from another person eating meals?: Total Help from another person taking care of personal grooming?: A Little Help from another person toileting, which includes using toliet, bedpan, or urinal?: Total Help from another person bathing (including washing, rinsing, drying)?: A Lot Help from another person to put on and taking off regular upper body clothing?: A Lot Help from another person to put on and taking off regular lower body clothing?: Total 6 Click Score: 10    End of Session Equipment Utilized During Treatment: Oxygen  OT Visit Diagnosis: Other abnormalities of gait and mobility (R26.89);Muscle weakness (generalized) (M62.81);Pain;Other symptoms and signs involving cognitive function;Other symptoms and signs involving the nervous system (R29.898) Pain - Right/Left: Right Pain - part of  body: Leg   Activity Tolerance Patient tolerated treatment well   Patient Left in bed;with call bell/phone within reach;with family/visitor present;with SCD's reapplied   Nurse Communication Mobility status        Time: 7026-3785 OT Time Calculation (min): 37 min  Charges: OT General Charges $OT Visit: 1 Visit OT Treatments $Self Care/Home Management : 8-22 mins  Jolaine Artist, [place] Pager [phone] Office [phone]    Delight Stare 02/23/2021, 1:48  PM

## 2021-02-23 NOTE — Progress Notes (Signed)
Nutrition Follow Up  DOCUMENTATION CODES:   Non-severe (moderate) malnutrition in context of chronic illness  INTERVENTION:   Consider PEG, if swallowing barriers continue or swallow function does not improve.   Transition tube Feeding -Osmolite 1.5 at 50 ml/hr (1200) via Cortrak  -Pro-Source TF 45 mL BID Provides 97 g of protein, 1880 kcals and 914 mL of free water  NUTRITION DIAGNOSIS:   Moderate Malnutrition related to chronic illness (GBS) as evidenced by mild fat depletion, moderate muscle depletion.  Ongoing  GOAL:   Patient will meet greater than or equal to 90% of their needs  Addressed via TF  MONITOR:   Vent status, Skin, TF tolerance, Weight trends, Labs, I & O's, Diet advancement  REASON FOR ASSESSMENT:   Consult, Ventilator    ASSESSMENT:   75 yo female admitted with acute respiratory failure with aspiration pneumonia, respiratory muscle weakness with bulbar dysfunction from presume GB type syndrome, encephalopathy, circulatory shock. PMH includes Celiac Disease  8/12 Admitted, IVIG started 8/14 Intubated 8/16 Gastric cortrak placed, s/p trach   Tolerating trach collar for ~4 hours daily. Plan to increase amount of time today. SLP working with patient and PMV. Per MD, plan to start working on swallowing once patien tolerates more hours off vent. Tolerating tube feeding at goal rate. Transition to standard formula.   Admission weight: 66.1 kg  Current weight: 62.6 kg   UOP: 1488 ml x 24 hrs   Medications: colace, SS novolog, miralax, 60 mEq Kcl daily Labs: Na 127 (L) CBG 114-134  Diet Order:   Diet Order             Diet NPO time specified  Diet effective midnight                   EDUCATION NEEDS:   Not appropriate for education at this time  Skin:  Skin Assessment: Reviewed RN Assessment  Last BM:  8/29  Height:   Ht Readings from Last 1 Encounters:  02/13/21 5' 4"  (1.626 m)    Weight:   Wt Readings from Last 1  Encounters:  02/23/21 62.6 kg    BMI:  Body mass index is 23.69 kg/m.  Estimated Nutritional Needs:   Kcal:  1700-1900 kcals  Protein:  85-105 g  Fluid:  >/= 1.7 L  Barney Gertsch MS, RD, LDN, CNSC Clinical Nutrition Pager listed in [place]

## 2021-02-23 NOTE — Progress Notes (Signed)
[facility] Progress Note Patient Name: Angela Morales DOB: 09/19/1945 MRN: [id number]   Date of Service  02/23/2021  HPI/Events of Note  BP 74/40, MAP 51, patient is asleep, EF 55 %.  eICU Interventions  LR 250 ml iv bolus x 1 ordered.        Frederik Pear 02/23/2021, 11:37 PM

## 2021-02-24 DIAGNOSIS — Z930 Tracheostomy status: Secondary | ICD-10-CM | POA: Diagnosis not present

## 2021-02-24 DIAGNOSIS — R339 Retention of urine, unspecified: Secondary | ICD-10-CM

## 2021-02-24 DIAGNOSIS — J9601 Acute respiratory failure with hypoxia: Secondary | ICD-10-CM | POA: Diagnosis not present

## 2021-02-24 DIAGNOSIS — I4891 Unspecified atrial fibrillation: Secondary | ICD-10-CM | POA: Diagnosis not present

## 2021-02-24 DIAGNOSIS — G610 Guillain-Barre syndrome: Secondary | ICD-10-CM | POA: Diagnosis not present

## 2021-02-24 DIAGNOSIS — F5101 Primary insomnia: Secondary | ICD-10-CM

## 2021-02-24 LAB — BASIC METABOLIC PANEL
Anion gap: 7 (ref 5–15)
BUN: 16 mg/dL (ref 8–23)
CO2: 23 mmol/L (ref 22–32)
Calcium: 7.7 mg/dL — ABNORMAL LOW (ref 8.9–10.3)
Chloride: 100 mmol/L (ref 98–111)
Creatinine, Ser: <0.3 mg/dL — ABNORMAL LOW (ref 0.44–1.00)
Glucose, Bld: 123 mg/dL — ABNORMAL HIGH (ref 70–99)
Potassium: 4.1 mmol/L (ref 3.5–5.1)
Sodium: 130 mmol/L — ABNORMAL LOW (ref 135–145)

## 2021-02-24 LAB — GLUCOSE, CAPILLARY
Glucose-Capillary: 145 mg/dL — ABNORMAL HIGH (ref 70–99)
Glucose-Capillary: 141 mg/dL — ABNORMAL HIGH (ref 70–99)
Glucose-Capillary: 157 mg/dL — ABNORMAL HIGH (ref 70–99)
Glucose-Capillary: 128 mg/dL — ABNORMAL HIGH (ref 70–99)
Glucose-Capillary: 131 mg/dL — ABNORMAL HIGH (ref 70–99)
Glucose-Capillary: 103 mg/dL — ABNORMAL HIGH (ref 70–99)

## 2021-02-24 LAB — CALCIUM, IONIZED: Calcium, Ionized, Serum: 5.1 mg/dL (ref 4.5–5.6)

## 2021-02-24 MED ORDER — MELATONIN 3 MG PO TABS
3.0000 mg | ORAL_TABLET | Freq: Every day | ORAL | Status: DC
  Administered 2021-02-24 – 2021-03-01 (×4): 3 mg
  Filled 2021-02-24 (×6): qty 1

## 2021-02-24 MED ORDER — SODIUM CHLORIDE 0.9 % IV SOLN: 250.0000 mL | INTRAVENOUS | Status: DC

## 2021-02-24 MED ORDER — AMIODARONE HCL 200 MG PO TABS: 200.0000 mg | ORAL_TABLET | Freq: Every day | ORAL | Status: DC

## 2021-02-24 MED ORDER — SENNOSIDES-DOCUSATE SODIUM 8.6-50 MG PO TABS: 1.0000 | ORAL_TABLET | Freq: Every evening | ORAL | Status: DC | PRN

## 2021-02-24 MED ORDER — LACTATED RINGERS IV BOLUS
500.0000 mL | Freq: Once | INTRAVENOUS | Status: AC
  Administered 2021-02-24: 500 mL via INTRAVENOUS

## 2021-02-24 MED ORDER — METOPROLOL TARTRATE 12.5 MG HALF TABLET
12.5000 mg | ORAL_TABLET | Freq: Two times a day (BID) | ORAL | Status: DC
  Administered 2021-02-25 – 2021-02-26 (×3): 12.5 mg
  Filled 2021-02-24 (×3): qty 1

## 2021-02-24 MED ORDER — QUETIAPINE FUMARATE 25 MG PO TABS
25.0000 mg | ORAL_TABLET | Freq: Every day | ORAL | Status: DC
  Administered 2021-02-24 – 2021-03-01 (×6): 25 mg
  Filled 2021-02-24 (×6): qty 1

## 2021-02-24 MED ORDER — APIXABAN 5 MG PO TABS
5.0000 mg | ORAL_TABLET | Freq: Two times a day (BID) | ORAL | Status: DC
  Administered 2021-02-24 – 2021-03-02 (×13): 5 mg
  Filled 2021-02-24 (×13): qty 1

## 2021-02-24 MED ORDER — PHENYLEPHRINE HCL-NACL 20-0.9 MG/250ML-% IV SOLN
0.0000 ug/min | INTRAVENOUS | Status: DC
  Administered 2021-02-24: 25 ug/min via INTRAVENOUS
  Filled 2021-02-24: qty 250

## 2021-02-24 MED ORDER — HYALURONIDASE HUMAN 150 UNIT/ML IJ SOLN
150.0000 [IU] | Freq: Once | INTRAMUSCULAR | Status: AC
  Administered 2021-02-24: 150 [IU] via SUBCUTANEOUS
  Filled 2021-02-24: qty 1

## 2021-02-24 MED ORDER — AMIODARONE HCL 200 MG PO TABS
200.0000 mg | ORAL_TABLET | Freq: Two times a day (BID) | ORAL | Status: DC
  Administered 2021-02-24 – 2021-03-02 (×13): 200 mg
  Filled 2021-02-24 (×13): qty 1

## 2021-02-24 NOTE — Progress Notes (Signed)
[facility] Progress Note Patient Name: Angela Morales DOB: Nov 30, 1945 MRN: [id number]   Date of Service  02/24/2021  HPI/Events of Note  BP 70/40, MAP 49.  eICU Interventions  Metoprolol dose changed to 12.5 mg Q 12 hours, LR 500 ml iv bolus x 1.        Frederik Pear 02/24/2021, 11:39 PM

## 2021-02-24 NOTE — Care Management (Addendum)
1153 02-24-21 Case Manager received a consult for LTAC. Case Manager reached out to [facility] and [facility] to see if the patient would be a candidate for LTAC. Awaiting to hear back from both facilities before speaking with family. Case Manager will continue to follow for additional disposition needs.   02-24-21 1220 Case Manager has heard back from both facilities- both facility liaisons states the patient is appropriate for LTAC. Case Manager will speak with provider regarding time frame of transition and then speak with family to see if they are agreeable. If family is agreeable then clinicals will need to be submitted to insurance to see if approval can be obtained.   02-24-21 1330 Case Manager reached out to spouse-received voicemail. Awaiting return call back to discuss LTAC.  02-24-21 1833 Case Manager was able to contact the husband when he was visiting the wife in the room. Case Manager discussed LTAC with the patient and spouse. Patient is a little hesitant regarding LTAC, based on "long term". Case Manager did discuss the services that LTAC offers. Patient is willing to have Liaison Lorenza Burton from [facility] to speak with her regarding an information session about LTAC. [facility] is aware to call the family in the am. Husband wanted to see if the patient would be a candidate for CIR. Case Manager will continue to follow for additional transition of care needs.

## 2021-02-24 NOTE — Progress Notes (Signed)
eLink Physician-Brief Progress Note Patient Name: Angela Morales DOB: [birth year] MRN: [id number]   Date of Service  02/24/2021  HPI/Events of Note  Patient's blood pressure responded initially to 250 ml iv fluid bolus with MAP up to 81, but blood pressure has gone back down.  eICU Interventions  LR 500 ml iv bolus ordered.        Kerry Kass [place] 02/24/2021, 12:27 AM

## 2021-02-24 NOTE — Progress Notes (Signed)
eLink Physician-Brief Progress Note Patient Name: Angela Morales DOB: [birth year] MRN: [id number]   Date of Service  02/24/2021  HPI/Events of Note  Patient BP still soft (81/43, MAP 55) after 750 ml iv fluid bolus (may be a component of autonomic dysfunction related to diagnosis of guillain Barre Syndrome). Patient is otherwise asymptomatic and making urine.  eICU Interventions  PRN peripheral Phenylephrine gtt ordered for MAP < 65 mmHg.        Kelcie Currie U Macyn Remmert 02/24/2021, 1:25 AM

## 2021-02-24 NOTE — Progress Notes (Addendum)
Patient ID: Angela Morales, female   DOB: 21-Jan-1946, 75 y.o.   MRN: [id number]   NAME:  Angela Morales, MRN:  [id number], DOB:  1945-09-20, LOS: 58 ADMISSION DATE:  02/05/2021, CONSULTATION DATE:  02/07/21  REFERRING MD:  Dr Lupita Leash, CHIEF COMPLAINT:  tachypnea, hypoxemia   History of Present Illness:  75 year old woman with a history of atrial fibrillation (ablated 8/3), celiac disease, DDD who was admitted on 8/11 with bilateral upper and lower extremity weakness and paresthesias beginning about 8/8.  Developed some ascending weakness that resulted in inability to ambulate.  No other prodrome.  No clear precipitants although she was started on gabapentin about 1 month ago.  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  LP deferred initially as she was on Eliquis last dose 8/11.  IVIG started 8/12.  She has had progressive bilateral upper extremity weakness, some bulbar dysfunction difficulty swallowing, globus sensation with perception of swallowing and voice weakness.  FVC has been consistently greater than 1.0 L, NIF <-25.  She had an episode of desaturation, increased work of breathing and choking when eating Jell-O and pudding evening of 8/13.  Required intubation on 8/14.   Pertinent  Medical History   Past Medical History:  Diagnosis Date   Abnormal uterine bleeding    on HRT   Allergy    seasonal   Arthritis    Atrial fibrillation (HCC)    Celiac disease    DDD (degenerative disc disease)    Dysrhythmia    Glaucoma    Microscopic colitis    Osteoarthritis of both knees    PONV (postoperative nausea and vomiting)    SVT (supraventricular tachycardia) ([place])     Significant Hospital Events: Including procedures, antibiotic start and stop dates in addition to other pertinent events   IVIg started 8/12 CT CAP >> no dissection, bibasilar atelectasis, distended gallbladder with some pericholecystic stranding, segmental colitis question related to  diverticular disease RUQ ultrasound 8/12 >> distended gallbladder without stones or pericholecystic fluid no evidence cholecystitis MR cervical, thoracic, lumbar spine 8/12 > normal spinal cord no imaging evidence of demyelination or Guillain-Barr.  Advanced lumbar DJD, cervical DJD 8/13 Zosyn started for possible aspiration pneumonia 8/14: Intubated for progressive respiratory failure, poor bulbar function, and downtrending NIF and vital capacity sodium down to 115 from 124 but corrected to 119 with hyperglycemia.  115 was treated with hypertonic saline bolus.  IV sedating drips stopped. 8/15: IV sedating drips stopped, placed back on propofol in the evening hours.  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  Free water limited.  MRI neg.  8/16: day 5 of 5 IVIG.  Remains profoundly weak.  Tracheostomy,  Getting IV Lasix for volume overload.  [place] neg. 8/17: Remains in ICU, completed 5 days of zosyn.  8/18: started round 2 of  IVIG per neuro 8/19 fentanyl/ klonopin stopped (very sensitive/ too somnolent) 8/22 IVIG completed, strength slowly improving, tolerating trach collar intermittently during the day 8/25 trach collar 2hr BID. Staph and klebsiella in trach aspirate 8/26 feels weaker. Starting cefazolin  8/27 atrial flutter overnight. Got fluid challenge  8/28 amiodarone infusion initiated 8/29 tolerating ATC during day. Hypotensive at night->got fluid challenge  8/30 amiodarone transition to via tube.  Lovenox changed back to apixaban  Interim History / Subjective:  No distress. Slowly getting stronger.   Objective   Blood pressure (Abnormal) 142/86, pulse 98, temperature 98.2 F (36.8 C), temperature source Oral, resp. rate (Abnormal) 24, height 5' 4"  (1.626 m),  weight 65.3 kg, last menstrual period 04/20/2005, SpO2 100 %.    Vent Mode: PRVC FiO2 (%):  [40 %] 40 % Set Rate:  [16 bmp] 16 bmp Vt Set:  [430 mL] 430 mL PEEP:  [5 cmH20] 5 cmH20 Plateau Pressure:  [10 cmH20-12 cmH20]  11 cmH20   Intake/Output Summary (Last 24 hours) at 02/24/2021 0831 Last data filed at 02/24/2021 0400 Gross per 24 hour  Intake 2055.29 ml  Output 1505 ml  Net 550.29 ml   Filed Weights   02/20/21 0500 02/23/21 0500 02/24/21 0500  Weight: 62.6 kg 62.6 kg 65.3 kg   General: Pleasant 75 year old female currently resting in bed she is in no acute distress HEENT normocephalic atraumatic size 6 cuffed tracheostomy in place, currently cuffless deflated, with excellent phonation using PMV Pulmonary: Clear, no accessory use on aerosol trach collar Cardiac: Currently normal sinus rhythm no murmur rub or gallop appreciated Abdomen: Feeding tube in place via nare.  Positive bowel sounds tolerating tube feeds Extremities: Bilateral foot drop boots in place.  Warm, some dependent edema, brisk capillary refill next neuro: Awake oriented no focal deficits cognitively.  Generalized weakness upper extremities but this is improving.  Very weak 1 out of 5 lower extremities, still cannot feel her lower extremities but she can move them on request   Resolved Hospital Problem list   Circulatory shock. Pressor dependent. Did meet SIRS criteria so w/ PNA will call this a mix of septic shock and also medication related hypotension Acute encephalopathy  SIADH  Assessment & Plan:   Acute respiratory failure with hypoxia due to NM weakness from GBS S. Aureus pneumonia- RLL Plan Day 5 of 7 Cefazolin  Cont nocturnal ventilation (mandatory at HS) Encourage ATC during day time and PMV as tolerated OOB Swallow eval soon.  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 A1c 5.0. Plan Cont ssi   Anxiety Disturbed sleep wake cycles Plan  Continue low-dose Seroquel Cont Melatonin at HS  PRN low dose ativan for break thru anxiety   Glaucoma Plan  Timolol gtts   Best Practice (right click and "Reselect all SmartList Selections" daily)   Diet/type: EN via cortrak DVT prophylaxis:  full dose lovenox-->transitioned back to DOAC 8/30 GI prophylaxis: PPI Lines: N/A, prev PICC removed Foley:  Yes, and it is still needed Started Ureocholine for retention Code Status:  full code Last date of multidisciplinary goals of care discussion: full code, full recovery expected.   My cct 10 min  Erick Colace ACNP-BC [place] Pager # [phone] OR # [phone] if no answer    Attending attestation:  Mrs. Solomon is a 75 y/o woman with a history of Afib, celiac disease who presented on 8/11 with ascending weakness, found to have GBS. She was intubated for respiratory failure on 8/14, trached on 8/16. She tolerated TC most of the day yesterday. Se worked with SLP and started with PMV yesterday. She did not sleep as well  last night after seroquel dose was reduced. Hypotensive overnight, received fluid boluses.  BP 127/88   Pulse 97   Temp 98.2 F (36.8 C) (Oral)   Resp 20   Ht 5' 4"  (1.626 m)   Wt 65.3 kg   LMP 04/20/2005   SpO2 100%   BMI 24.71 kg/m  Ill appearing woman lying in bed in [place]/AT, eyes anicteric PM, trach in place, no drainage around stoma. S1S2, irreg rhythm, reg rate. Breathing comfortably on TC. Minimal rhales on he R, CTA on left.  Abd soft, NT No LE edema, mild hand edema. No cyanosis or clubbing Skin warm, dry, pallor. Stronger voice today, able to lift arms off the bed  easily, still barel able to move LEs-- mostly moving from the hip. 2/5 knee flexion.  Labs reviewed. 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 -previouly received IVIG -rehabilitation-- SLP, PT, OT. -con't vent weaning efforts  Acute respiratory failure with hypoxia requiring tracheostomy for prolonged vent weaning MSSA pneumonia- bilateral lower lobes. -complete 7 days of cefazolin -full vent support at night -Ok to do all day on TC trials as lon as she is not fatiguing. Once she has tolerated several days of weaning all day we can start nocurnal weaning.  Urinary retention -con't urecholine -trial of foley removal; she reports poor functioning of purewicks prevously  Hyponatremia- presume SIADH -fluid restriction  Mild hyperglcemia -SSI PRN -goal BG 140-180  Dysphagia -TF -SLP  Celiac disease -will need to be careful as we progress her diet  Husband [name] updated at bedside.  This patient is critically ill with multiple organ system failure which requires frequent high complexity decision making, assessment, support, evaluation, and titration of therapies. This was completed through the application of advanced monitoring technologies and extensive interpretation of multiple databases. During this encounter critical care time was devoted to patient care services described in this note for 35 minutes.   Julian Hy, DO 02/24/21 9:48 AM [facility]

## 2021-02-24 NOTE — Progress Notes (Signed)
Physical Therapy Treatment Patient Details Name: Angela Morales MRN: [id number] DOB: 08/28/1945 Today's Date: 02/24/2021    History of Present Illness Pt is a 75 y.o. female admitted 02/05/21 with c/o progressive BUE/BLE numbness and weakness; diagnosed with Guillain-Barr syndrome. Pt also with afib with RVR. Intubated 8/14 due to Acute respiratory failure with hypoxia w/ aspiration PNA and bibasilar atx.[facility] Kitchen MRI negative 8/15. Trach placed 8/16. CT brain 8/16 negative.  PMH includes afib (recent ablation 01/28/21), celiac disease, DDD, gluacoma, L TKA (2017).    PT Comments    Pt making steady progress. Focused on sitting balance. As pt continues to progress may be able to consider CIR. Will continue to work on mobility and balance. Working on getting PRAFO's for feet/ankles.  Follow Up Recommendations  CIR;LTACH     Equipment Recommendations  Rolling walker with 5" wheels;Wheelchair (measurements PT);Wheelchair cushion (measurements PT);Hospital bed;Other (comment) (hoyer lift)    Recommendations for Other Services       Precautions / Restrictions Precautions Precautions: Fall Precaution Comments: trach, cortrak    Mobility  Bed Mobility Overal bed mobility: Needs Assistance Bed Mobility: Rolling;Sidelying to Sit;Sit to Sidelying Rolling: Min assist Sidelying to sit: Max assist;HOB elevated     Sit to sidelying: Total assist General bed mobility comments: Assist to bring hips over to roll. Assist to bring legs off of bed and elevate trunk into sitting. Assist to lower trunk and bring legs back up into bed    Transfers                 General transfer comment: Did not attempt with one person assist  Ambulation/Gait                 Stairs             Wheelchair Mobility    Modified Rankin (Stroke Patients Only)       Balance Overall balance assessment: Needs assistance Sitting-balance support: Bilateral upper extremity supported;Feet  supported Sitting balance-Leahy Scale: Poor Sitting balance - Comments: Sat EOB x 10 minutes with min guard to min assist                                    Cognition Arousal/Alertness: Awake/alert Behavior During Therapy: WFL for tasks assessed/performed Overall Cognitive Status: Within Functional Limits for tasks assessed                                        Exercises General Exercises - Lower Extremity Long Arc Quad: AROM;Both;5 reps;Seated (poor control) Other Exercises Other Exercises: scapular retraction x 5 in sitting Other Exercises: Trunk extension x 3 in sitting    General Comments        Pertinent Vitals/Pain Pain Assessment: Faces Faces Pain Scale: Hurts little more Pain Location: back, LE's Pain Descriptors / Indicators: Guarding;Grimacing;Pins and needles Pain Intervention(s): Limited activity within patient's tolerance;Repositioned    Home Living                      Prior Function            PT Goals (current goals can now be found in the care plan section) Progress towards PT goals: Progressing toward goals    Frequency    Min 4X/week      PT Plan Current  plan remains appropriate    Co-evaluation              AM-PAC PT "6 Clicks" Mobility   Outcome Measure  Help needed turning from your back to your side while in a flat bed without using bedrails?: A Lot Help needed moving from lying on your back to sitting on the side of a flat bed without using bedrails?: A Lot Help needed moving to and from a bed to a chair (including a wheelchair)?: Total Help needed standing up from a chair using your arms (e.g., wheelchair or bedside chair)?: Total Help needed to walk in hospital room?: Total Help needed climbing 3-5 steps with a railing? : Total 6 Click Score: 8    End of Session Equipment Utilized During Treatment: Oxygen Activity Tolerance: Patient tolerated treatment well;Patient limited by  fatigue Patient left: in bed;with call bell/phone within reach;with bed alarm set;with SCD's reapplied;with family/visitor present Nurse Communication: Mobility status;Need for lift equipment PT Visit Diagnosis: Other abnormalities of gait and mobility (R26.89);Muscle weakness (generalized) (M62.81);Other symptoms and signs involving the nervous system (R29.898)     Time: 1420-1440 PT Time Calculation (min) (ACUTE ONLY): 20 min  Charges:  $Therapeutic Activity: 8-22 mins                     [place] Pager [phone] Office [place] 02/24/2021, 5:09 PM

## 2021-02-25 ENCOUNTER — Ambulatory Visit (HOSPITAL_COMMUNITY): Payer: Medicare Other | Admitting: Physician Assistant

## 2021-02-25 DIAGNOSIS — R935 Abnormal findings on diagnostic imaging of other abdominal regions, including retroperitoneum: Secondary | ICD-10-CM | POA: Diagnosis not present

## 2021-02-25 DIAGNOSIS — G610 Guillain-Barre syndrome: Secondary | ICD-10-CM | POA: Diagnosis not present

## 2021-02-25 LAB — CBC
HCT: 31.3 % — ABNORMAL LOW (ref 36.0–46.0)
Hemoglobin: 10.2 g/dL — ABNORMAL LOW (ref 12.0–15.0)
MCH: 31.6 pg (ref 26.0–34.0)
MCHC: 32.6 g/dL (ref 30.0–36.0)
MCV: 96.9 fL (ref 80.0–100.0)
Platelets: 341 10*3/uL (ref 150–400)
RBC: 3.23 MIL/uL — ABNORMAL LOW (ref 3.87–5.11)
RDW: 13.9 % (ref 11.5–15.5)
WBC: 5.6 10*3/uL (ref 4.0–10.5)
nRBC: 0 % (ref 0.0–0.2)

## 2021-02-25 LAB — GLUCOSE, CAPILLARY
Glucose-Capillary: 102 mg/dL — ABNORMAL HIGH (ref 70–99)
Glucose-Capillary: 105 mg/dL — ABNORMAL HIGH (ref 70–99)
Glucose-Capillary: 107 mg/dL — ABNORMAL HIGH (ref 70–99)
Glucose-Capillary: 118 mg/dL — ABNORMAL HIGH (ref 70–99)
Glucose-Capillary: 121 mg/dL — ABNORMAL HIGH (ref 70–99)
Glucose-Capillary: 132 mg/dL — ABNORMAL HIGH (ref 70–99)

## 2021-02-25 LAB — BASIC METABOLIC PANEL
Anion gap: 6 (ref 5–15)
BUN: 14 mg/dL (ref 8–23)
CO2: 24 mmol/L (ref 22–32)
Calcium: 7.8 mg/dL — ABNORMAL LOW (ref 8.9–10.3)
Chloride: 98 mmol/L (ref 98–111)
Creatinine, Ser: 0.31 mg/dL — ABNORMAL LOW (ref 0.44–1.00)
GFR, Estimated: >60 mL/min (ref >60)
Glucose, Bld: 126 mg/dL — ABNORMAL HIGH (ref 70–99)
Potassium: 4.2 mmol/L (ref 3.5–5.1)
Sodium: 128 mmol/L — ABNORMAL LOW (ref 135–145)

## 2021-02-25 MED ORDER — PROPOFOL 10 MG/ML IV BOLUS
50.0000 mg | Freq: Once | INTRAVENOUS | Status: AC
  Administered 2021-02-25: 50 mg via INTRAVENOUS
  Filled 2021-02-25: qty 20

## 2021-02-25 MED ORDER — PHENYLEPHRINE 40 MCG/ML (10ML) SYRINGE FOR IV PUSH (FOR BLOOD PRESSURE SUPPORT)
80.0000 ug | PREFILLED_SYRINGE | Freq: Once | INTRAVENOUS | Status: AC | PRN
  Administered 2021-02-25: 160 ug via INTRAVENOUS
  Filled 2021-02-25: qty 10

## 2021-02-25 MED ORDER — SODIUM CHLORIDE 0.9 % IV SOLN: 250.0000 mL | INTRAVENOUS | Status: DC

## 2021-02-25 MED ORDER — PHENYLEPHRINE HCL-NACL 20-0.9 MG/250ML-% IV SOLN
25.0000 ug/min | INTRAVENOUS | Status: DC
  Administered 2021-02-25: 25 ug/min via INTRAVENOUS

## 2021-02-25 NOTE — Care Management (Signed)
02-25-21 1047 Case Manager spoke with the patient this am and she is agreeable to pursuing [facility] in the hospital. Arley Phenix Liaison with [facility] is aware and she will submit for insurance authorization today. Awaiting insurance authorization and bed availability at this time. Case Manager will continue to follow for additional transition of care needs.

## 2021-02-25 NOTE — Progress Notes (Signed)
eLink Physician-Brief Progress Note Patient Name: ARIDAY BRINKER DOB: [birth year] MRN: [id number]   Date of Service  02/25/2021  HPI/Events of Note  SBP in the 70's after receiving evening dose of Metoprolol + Seroquel,  it was not normalized by a 500 ml LR fluid bolus.  eICU Interventions  Peripheral Phenylephrine gtt ordered to be started for MAP < 65 mmHg only.        Belem Hintze U Baneen Wieseler 02/25/2021, 1:08 AM

## 2021-02-25 NOTE — Progress Notes (Signed)
Occupational Therapy Treatment Patient Details Name: Angela Morales MRN: [id number] DOB: [birth year] Today's Date: 02/25/2021    History of present illness Pt is a 75 y.o. female admitted 02/05/21 with c/o progressive BUE/BLE numbness and weakness; diagnosed with Guillain-Barr syndrome. Pt also with afib with RVR. Intubated 8/14 due to Acute respiratory failure with hypoxia w/ aspiration PNA and bibasilar atx.Marland Kitchen MRI negative 8/15. Trach placed 8/16. CT brain 8/16 negative.  PMH includes afib (recent ablation 01/28/21), celiac disease, DDD, gluacoma, L TKA (2017).   OT comments  Patient supine in bed and agreeable to OT/PT session.  Requires max assist +2 for lateral scoot in to recliner, seated supported engaged in ADLs with min assist for grooming, total assist for LB. Pt fatigues easily and remains limited by weakness, impaired balance and decreased activity tolerance.  Will follow acutely.    Follow Up Recommendations  Supervision/Assistance - 24 hour;LTACH;CIR (progressing towards CIR)    Equipment Recommendations  Other (comment) (TBD)    Recommendations for Other Services Rehab consult    Precautions / Restrictions Precautions Precautions: Fall Precaution Comments: trach, cortrak Restrictions Weight Bearing Restrictions: No       Mobility Bed Mobility Overal bed mobility: Needs Assistance Bed Mobility: Rolling;Sidelying to Sit Rolling: Min assist Sidelying to sit: Mod assist;+2 for physical assistance;+2 for safety/equipment Supine to sit: Total assist;+2 for physical assistance     General bed mobility comments: pt rolling in bed with min assist for hygiene care, transitioned to EOB with max assist for LB and min assist for UB to ascend trunk    Transfers Overall transfer level: Needs assistance   Transfers: Lateral/Scoot Transfers          Lateral/Scoot Transfers: Max assist;+2 physical assistance;+2 safety/equipment General transfer comment: max assist +2  using pad and blocking B knees to lateral scoot towards L side into recliner.  Pt assisting minimally with B UEs.    Balance Overall balance assessment: Needs assistance Sitting-balance support: Bilateral upper extremity supported;Feet supported Sitting balance-Leahy Scale: Poor Sitting balance - Comments: min guard at best, fatigues easily and requires UE support                                   ADL either performed or assessed with clinical judgement   ADL Overall ADL's : Needs assistance/impaired Eating/Feeding: NPO   Grooming: Minimal assistance;Wash/dry face;Brushing hair Grooming Details (indicate cue type and reason): min assist to brush hair in supported sitting, setup to wash face             Lower Body Dressing: Total assistance;Sitting/lateral leans Lower Body Dressing Details (indicate cue type and reason): assist for socks Toilet Transfer: Total assistance;+2 for safety/equipment;+2 for physical assistance Toilet Transfer Details (indicate cue type and reason): lateral         Functional mobility during ADLs: Total assistance;+2 for physical assistance;+2 for safety/equipment General ADL Comments: remains limited by decreased activity tolerance and endurance, impaired balance and weakness     Vision       Perception     Praxis      Cognition Arousal/Alertness: Awake/alert Behavior During Therapy: WFL for tasks assessed/performed Overall Cognitive Status: Within Functional Limits for tasks assessed                                 General Comments: Pt follows commands accurately with  increased time due to LE weakness. Multimodal cues provided to assist.        Exercises General Exercises - Lower Extremity Ankle Circles/Pumps: AAROM;Both;10 reps;Seated (Long sitting)   Shoulder Instructions       General Comments VSS on trach collat, 35% fiO2    Pertinent Vitals/ Pain       Pain Assessment: Faces Faces Pain Scale:  Hurts little more Pain Location: back, LE's Pain Descriptors / Indicators: Guarding;Grimacing;Pins and needles Pain Intervention(s): Limited activity within patient's tolerance;Monitored during session;Repositioned  Home Living                                          Prior Functioning/Environment              Frequency  Min 2X/week        Progress Toward Goals  OT Goals(current goals can now be found in the care plan section)  Progress towards OT goals: Progressing toward goals  Acute Rehab OT Goals Patient Stated Goal: get better. OT Goal Formulation: With patient  Plan Discharge plan remains appropriate;Frequency remains appropriate    Co-evaluation    PT/OT/SLP Co-Evaluation/Treatment: Yes Reason for Co-Treatment: Complexity of the patient's impairments (multi-system involvement);To address functional/ADL transfers;For patient/therapist safety PT goals addressed during session: Mobility/safety with mobility;Balance OT goals addressed during session: ADL's and self-care      AM-PAC OT "6 Clicks" Daily Activity     Outcome Measure   Help from another person eating meals?: Total Help from another person taking care of personal grooming?: A Little Help from another person toileting, which includes using toliet, bedpan, or urinal?: Total Help from another person bathing (including washing, rinsing, drying)?: A Lot Help from another person to put on and taking off regular upper body clothing?: A Lot Help from another person to put on and taking off regular lower body clothing?: Total 6 Click Score: 10    End of Session Equipment Utilized During Treatment: Oxygen  OT Visit Diagnosis: Other abnormalities of gait and mobility (R26.89);Muscle weakness (generalized) (M62.81);Pain;Other symptoms and signs involving cognitive function;Other symptoms and signs involving the nervous system (R29.898) Pain - Right/Left: Right Pain - part of body: Leg    Activity Tolerance Patient tolerated treatment well   Patient Left in chair;with call bell/phone within reach;with nursing/sitter in room   Nurse Communication Mobility status        Time: 1140-1220 OT Time Calculation (min): 40 min  Charges: OT General Charges $OT Visit: 1 Visit OT Treatments $Self Care/Home Management : 8-22 mins  Jolaine Artist, [place] Pager [phone] Office [place] 02/25/2021, 1:28 PM

## 2021-02-25 NOTE — Progress Notes (Signed)
Physical Therapy Treatment Patient Details Name: Angela Morales MRN: [id number] DOB: 1945/08/24 Today's Date: 02/25/2021    History of Present Illness Pt is a 75 y.o. female admitted 02/05/21 with c/o progressive BUE/BLE numbness and weakness; diagnosed with Guillain-Barr syndrome. Pt also with afib with RVR. Intubated 8/14 due to Acute respiratory failure with hypoxia w/ aspiration PNA and bibasilar atx.Marland Kitchen MRI negative 8/15. Trach placed 8/16. CT brain 8/16 negative.  PMH includes afib (recent ablation 01/28/21), celiac disease, DDD, gluacoma, L TKA (2017).    PT Comments    Pt tolerated treatment well with VSS on trach collar, 35% FiO2. Pt progressed to scoot transfer to chair with bilateral knee block. Concerned for pt's ankles, as pt is demonstrating significant inversion during transfers and while seated. Plan to chat MD regarding air cast for pt. Continue to recommend CIR given pt's progression.    Follow Up Recommendations  CIR;LTACH     Equipment Recommendations  Rolling walker with 5" wheels;Wheelchair (measurements PT);Wheelchair cushion (measurements PT);Hospital bed;Other (comment)    Recommendations for Other Services       Precautions / Restrictions Precautions Precautions: Fall Precaution Comments: trach, cortrak Restrictions Weight Bearing Restrictions: No    Mobility  Bed Mobility Overal bed mobility: Needs Assistance Bed Mobility: Supine to Sit Rolling: Min assist   Supine to sit: Total assist;+2 for physical assistance     General bed mobility comments: Total A for supine to sit to bring LEs off EOB and elevate trunk.Pt able to initiate LE movement. Assist from pads to scoot hips EOB. Min A for rolling to R with assist from bedrail.    Transfers Overall transfer level: Needs assistance   Transfers: Lateral/Scoot Transfers          Lateral/Scoot Transfers: Min assist;+2 physical assistance General transfer comment: MinA +2 to transfer to recliner  with bilateral knee block and assist from pads. Pt able to push through UEs to assist with transfer.  Ambulation/Gait             General Gait Details: Unable to assess   Stairs             Wheelchair Mobility    Modified Rankin (Stroke Patients Only)       Balance Overall balance assessment: Needs assistance Sitting-balance support: Bilateral upper extremity supported;Feet supported Sitting balance-Leahy Scale: Poor Sitting balance - Comments: Sat EOB for 75mns with support from BUE. Pt aware of when she is going to lose balance.                                    Cognition Arousal/Alertness: Awake/alert Behavior During Therapy: WFL for tasks assessed/performed Overall Cognitive Status: Within Functional Limits for tasks assessed                                 General Comments: Pt follows commands accurately with increased time due to LE weakness. Multimodal cues provided to assist.      Exercises General Exercises - Lower Extremity Ankle Circles/Pumps: AAROM;Both;10 reps;Seated (Long sitting)    General Comments General comments (skin integrity, edema, etc.): VSS on trach collar, 35%FiO2      Pertinent Vitals/Pain Pain Assessment: No/denies pain    Home Living                      Prior Function  PT Goals (current goals can now be found in the care plan section) Progress towards PT goals: Progressing toward goals    Frequency    Min 4X/week      PT Plan Current plan remains appropriate    Co-evaluation PT/OT/SLP Co-Evaluation/Treatment: Yes Reason for Co-Treatment: Complexity of the patient's impairments (multi-system involvement);For patient/therapist safety;To address functional/ADL transfers PT goals addressed during session: Mobility/safety with mobility;Balance        AM-PAC PT "6 Clicks" Mobility   Outcome Measure  Help needed turning from your back to your side while in a flat  bed without using bedrails?: A Lot Help needed moving from lying on your back to sitting on the side of a flat bed without using bedrails?: A Lot Help needed moving to and from a bed to a chair (including a wheelchair)?: Total Help needed standing up from a chair using your arms (e.g., wheelchair or bedside chair)?: Total Help needed to walk in hospital room?: Total Help needed climbing 3-5 steps with a railing? : Total 6 Click Score: 8    End of Session   Activity Tolerance: Patient tolerated treatment well Patient left: in chair;with chair alarm set;with call bell/phone within reach;with nursing/sitter in room Nurse Communication: Mobility status PT Visit Diagnosis: Other abnormalities of gait and mobility (R26.89);Muscle weakness (generalized) (M62.81);Other symptoms and signs involving the nervous system (R29.898)     Time: 2550-0164 PT Time Calculation (min) (ACUTE ONLY): 39 min  Charges:  $Therapeutic Exercise: 8-22 mins $Therapeutic Activity: 8-22 mins                     Louie Casa, SPT Acute Rehab: [phone]      Domingo Dimes 02/25/2021, 12:53 PM

## 2021-02-25 NOTE — Progress Notes (Addendum)
Patient ID: MADYSON LUKACH, female   DOB: 1946-03-11, 75 y.o.   MRN: [id number]   NAME:  Angela Morales, MRN:  [id number], DOB:  Apr 22, 1946, LOS: 27 ADMISSION DATE:  02/05/2021, CONSULTATION DATE:  02/07/21  REFERRING MD:  Dr Lupita Leash, CHIEF COMPLAINT:  tachypnea, hypoxemia   History of Present Illness:  75 year old woman with a history of atrial fibrillation (ablated 8/3), celiac disease, DDD who was admitted on 8/11 with bilateral upper and lower extremity weakness and paresthesias beginning about 8/8.  Developed some ascending weakness that resulted in inability to ambulate.  No other prodrome.  No clear precipitants although she was started on gabapentin about 1 month ago.  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  LP deferred initially as she was on Eliquis last dose 8/11.  IVIG started 8/12.  She has had progressive bilateral upper extremity weakness, some bulbar dysfunction difficulty swallowing, globus sensation with perception of swallowing and voice weakness.  FVC has been consistently greater than 1.0 L, NIF <-25.  She had an episode of desaturation, increased work of breathing and choking when eating Jell-O and pudding evening of 8/13.  Required intubation on 8/14.   Pertinent  Medical History   Past Medical History:  Diagnosis Date   Abnormal uterine bleeding    on HRT   Allergy    seasonal   Arthritis    Atrial fibrillation (HCC)    Celiac disease    DDD (degenerative disc disease)    Dysrhythmia    Glaucoma    Microscopic colitis    Osteoarthritis of both knees    PONV (postoperative nausea and vomiting)    SVT (supraventricular tachycardia) ([facility])     Significant Hospital Events: Including procedures, antibiotic start and stop dates in addition to other pertinent events   IVIg started 8/12 CT CAP >> no dissection, bibasilar atelectasis, distended gallbladder with some pericholecystic stranding, segmental colitis question related to  diverticular disease RUQ ultrasound 8/12 >> distended gallbladder without stones or pericholecystic fluid no evidence cholecystitis MR cervical, thoracic, lumbar spine 8/12 > normal spinal cord no imaging evidence of demyelination or Guillain-Barr.  Advanced lumbar DJD, cervical DJD 8/13 Zosyn started for possible aspiration pneumonia 8/14: Intubated for progressive respiratory failure, poor bulbar function, and downtrending NIF and vital capacity sodium down to 115 from 124 but corrected to 119 with hyperglycemia.  115 was treated with hypertonic saline bolus.  IV sedating drips stopped. 8/15: IV sedating drips stopped, placed back on propofol in the evening hours.  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  Free water limited.  MRI neg.  8/16: day 5 of 5 IVIG.  Remains profoundly weak.  Tracheostomy,  Getting IV Lasix for volume overload.  [place] neg. 8/17: Remains in ICU, completed 5 days of zosyn.  8/18: started round 2 of  IVIG per neuro 8/19 fentanyl/ klonopin stopped (very sensitive/ too somnolent) 8/22 IVIG completed, strength slowly improving, tolerating trach collar intermittently during the day 8/25 trach collar 2hr BID. Staph and klebsiella in trach aspirate 8/26 feels weaker. Starting cefazolin  8/27 atrial flutter overnight. Got fluid challenge  8/28 amiodarone infusion initiated 8/29 tolerating ATC during day. Hypotensive at night->got fluid challenge  8/30 amiodarone transition to via tube.  Lovenox changed back to apixaban 8/31: episode of hypotension with SBP in 70s after given metoprolol and seroquel. Didn't respond to fluids. Placed on neo  Interim History / Subjective:  Overnight, episode of hypotension with SBP in 70s after given metoprolol and seroquel.  Didn't respond to fluids. Placed on neo BP stable this am. Patient is on TCT in no distress. Patient has no complaints.   Objective   Blood pressure 102/60, pulse 98, temperature 98.6 F (37 C), temperature source  Oral, resp. rate (!) 22, height 5' 4"  (1.626 m), weight 67.2 kg, last menstrual period 04/20/2005, SpO2 99 %.    Vent Mode: PRVC FiO2 (%):  [35 %-40 %] 35 % Set Rate:  [16 bmp] 16 bmp Vt Set:  [430 mL] 430 mL PEEP:  [5 cmH20] 5 cmH20 Plateau Pressure:  [16 cmH20-18 cmH20] 16 cmH20   Intake/Output Summary (Last 24 hours) at 02/25/2021 0837 Last data filed at 02/25/2021 0600 Gross per 24 hour  Intake 866.32 ml  Output 2500 ml  Net -1633.68 ml    Filed Weights   02/23/21 0500 02/24/21 0500 02/25/21 0500  Weight: 62.6 kg 65.3 kg 67.2 kg   General:   NAD on TCT HEENT: MM pink/moist; trach in place Neuro: Aox3; MAE CV: s1s2, RRR, no m/r/g PULM:  dim clear BS bilaterally; on trach collar 35% w/ sats 100%; vent on standby for nighttime use GI: soft, bsx4 active  Extremities: warm/dry, no edema  Skin: no rashes or lesions   Labs:  Na 128 Hgb 10.2   Resolved Hospital Problem list   Circulatory shock. Pressor dependent. Did meet SIRS criteria so w/ PNA will call this a mix of septic shock and also medication related hypotension Acute encephalopathy  SIADH  Assessment & Plan:   Acute respiratory failure with hypoxia due to NM weakness from GBS S. Aureus pneumonia- RLL P: -continue cefazolin: day 6 of 7 -continue ventilation QHS -TCT and PMV trial during the day as tolerated -trach care protocol in place -Pulm toilet: OOB -SLP eval pending for swallow eval -will check CXR in am  Guillain Bare syndrome, idiopathic vs post-procedural from cardiac ablation -completed IVIG P: -supportive care -will likely need to placed in LTAC  Paroxysmal atrial flutter  HTN P: -cont lopressor and amio -cont apixaban -cont telemetry monitoring  Urinary retention P: -Cont Urecholine  -foley removed yesterday; continue to monitor UOP  Dysphagia P: -continue tube feeds -SLP pending  Celiac disease  P: -Cont gluten free TF and needs gluten free diet going forward    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 A1c 5.0. P: -ssi and cbg  Anxiety Disturbed sleep wake cycles P: -Continue low-dose Seroquel -Cont Melatonin -PRN low dose ativan for break thru anxiety   Glaucoma P: -timolol   Best Practice (right click and "Reselect all SmartList Selections" daily)   Diet/type: EN via cortrak DVT prophylaxis:  full dose lovenox-->transitioned back to DOAC 8/30 GI prophylaxis: PPI Lines: N/A, prev PICC removed Foley:  N/A on Ureocholine for retention Code Status:  full code Last date of multidisciplinary goals of care discussion: full code, full recovery expected.   My cct 43 min  JD Rexene Agent [facility] 02/25/2021, 8:52 AM  Please see [email or web address] for pager details.  From 7A-7P if no response, please call [phone]. After hours, please call ELink [phone].

## 2021-02-25 NOTE — Progress Notes (Signed)
Orthopedic Tech Progress Note Patient Details:  Angela Morales 1946/03/27 [id number]  Ortho Devices Type of Ortho Device: Ankle Air splint Ortho Device/Splint Location: Bi-L ankles Ortho Device/Splint Interventions: Application   Post Interventions Patient Tolerated: Well  Angela Morales Angela Morales 02/25/2021, 2:21 PM

## 2021-02-25 NOTE — Progress Notes (Signed)
  Speech Language Pathology Treatment: [name] Speaking valve  Patient Details Name: Angela Morales MRN: [id number] DOB: 05-Nov-1945 Today's Date: 02/25/2021 Time: 6073-7106 SLP Time Calculation (min) (ACUTE ONLY): 10 min  Assessment / Plan / Recommendation Clinical Impression  Arrived at room to complete swallow assessment, however pt is NPO for cardioversion this afternoon.  Sitting in recliner with PMV in place; has been doing well on ATC and pt verbalized being pleased with progress.  Saturating well with PMV on; RR stable. Removal of valve reveals good access to upper airway and no concerns for air trapping. Voice sounds fantastic - strong, good quality.  UE weakness interferes with pt's ability to manage PMV independently - recommend using valve as often as possible during waking hours with intermittent supervision.  SLP will f/u for swallow eval after procedure - either this pm or next date as scheduling allows.  RN, pt verbalize understanding. Anticipate that she will do well.    HPI HPI: Angela Morales is a 75 y.o. female who presented with complaint of weakness and numbness in arms and legs. Concern for GBS, s/p IVIG completed 8/16. Initial BSE 8/13 with recommendation for full liquid diet. GI consult that day with concern for more neurological dysphagia vs esophageal. Later that day pt had a suspected aspiration event with jello. She was intubated 8/13 and trached 8/16. PMH significant for atrial fibrillation (on anticoagulation, recently s/p ablation on 8/3 after failed cardioversion on 10/30/2020), celiac disease, degenerative disc disease. Plan for cardioversion 8/31.      SLP Plan  Continue with current plan of care       Recommendations         Patient may use Passy-Muir Speech Valve: Intermittently with supervision;During all therapies with supervision PMSV Supervision: Intermittent         Oral Care Recommendations: Oral care QID Follow up Recommendations:  Inpatient Rehab SLP Visit Diagnosis: Aphonia (R49.1) Plan: Continue with current plan of care       [place]. Tivis Ringer, [place] Office number [phone] Pager [phone]  Assunta Curtis 02/25/2021, 12:49 PM

## 2021-02-25 NOTE — CV Procedure (Signed)
Electrical Cardioversion Procedure Note Angela Morales [id number] Apr 08, 1946  Procedure: Electrical Cardioversion Indications:  Atrial Flutter  Procedure Details Consent: Risks of procedure as well as the alternatives and risks of each were explained to the (patient/caregiver).  Consent for procedure obtained. Time Out: Verified patient identification, verified procedure, site/side was marked, verified correct patient position, special equipment/implants available, medications/allergies/relevent history reviewed, required imaging and test results available.  Performed  Patient placed on cardiac monitor, pulse oximetry, patient placed back on ventilator  Sedation given:  Propofol 47m Pacer pads placed anterior and posterior chest.  Cardioverted 1 time(s).  Cardioverted at [place]  Evaluation Findings: Post procedure EKG shows: NSR Complications: hypotension treated with phenylephrine Patient did tolerate procedure well.   Angela Morales 02/25/2021, 4:01 PM

## 2021-02-25 NOTE — Progress Notes (Signed)
RT note- Patient placed on full support for cardioversion, will return to ATC post procedure.

## 2021-02-25 NOTE — Progress Notes (Signed)
Pt placed back on ventilator due to exhaustion. Pt resting comfortably sat 100, RT will cont to monitor.

## 2021-02-25 NOTE — Discharge Summary (Signed)
Physician Discharge Summary           Patient ID: Angela Morales MRN: [id number] DOB/AGE: June 09, 1946 75 y.o.  Admit date: 02/05/2021 Discharge date: 03/04/2021  Discharge Diagnoses:    Guillain-Barr syndrome Acute hypoxic respiratory failure secondary to neuromuscular weakness Staff aureus pneumonia involving the right lower lobe History of cardiac ablation Paroxysmal atrial fibrillation History of hypertension Urinary retention Dysphagia React disease SIADH Hyponatremia Hyperglycemia Insomnia Anxiety Glaucoma Resolved septic shock Medication related hypotension Resolved acute encephalopathy Physical deconditioning  Discharge summary   75 year old woman with a history of atrial fibrillation (ablated 8/3), celiac disease, DDD who was admitted on 8/11 with bilateral upper and lower extremity weakness and paresthesias beginning about 8/8.  Developed some ascending weakness that resulted in inability to ambulate.  Baseline ambulatory, fully independent for ADLs, iADLs.  No other prodrome.  No clear precipitants although she was started on gabapentin about 1 month ago.  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  LP deferred initially as she was on Eliquis last dose 8/11.  IVIG started 8/12.  She has had progressive bilateral upper extremity weakness, some bulbar dysfunction difficulty swallowing, globus sensation with perception of swallowing and voice weakness.  FVC has been consistently greater than 1.0 L, NIF <-25.  She had an episode of desaturation, increased work of breathing and choking when eating Jell-O and pudding evening of 8/13.  Required intubation on 8/14.  Intensive care course: IVIg started 8/12 CT CAP >> no dissection, bibasilar atelectasis, distended gallbladder with some pericholecystic stranding, segmental colitis question related to diverticular disease RUQ ultrasound 8/12 >> distended gallbladder without stones or  pericholecystic fluid no evidence cholecystitis MR cervical, thoracic, lumbar spine 8/12 > normal spinal cord no imaging evidence of demyelination or Guillain-Barr.  Advanced lumbar DJD, cervical DJD 8/13 Zosyn started for possible aspiration pneumonia 8/14: Intubated for progressive respiratory failure, poor bulbar function, and downtrending NIF and vital capacity sodium down to 115 from 124 but corrected to 119 with hyperglycemia.  115 was treated with hypertonic saline bolus.  IV sedating drips stopped. 8/15: IV sedating drips stopped, placed back on propofol in the evening hours.  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  Free water limited.  MRI neg.  8/16: day 5 of 5 IVIG.  Remains profoundly weak.  Tracheostomy,  Getting IV Lasix for volume overload.  [place] neg. 8/17: Remains in ICU, completed 5 days of zosyn.  8/18: started round 2 of  IVIG per neuro 8/19 fentanyl/ klonopin stopped (very sensitive/ too somnolent) 8/22 IVIG completed, strength slowly improving, tolerating trach collar intermittently during the day 8/25 trach collar 2hr BID. Staph and klebsiella in trach aspirate 8/26 feels weaker. Starting cefazolin  8/27 atrial flutter overnight. Got fluid challenge  8/28 amiodarone infusion initiated 8/29 tolerating ATC during day. Hypotensive at night->got fluid challenge  8/30 amiodarone transition to via tube.  Lovenox changed back to apixaban 8/31: episode of hypotension with SBP in 70s after given metoprolol and seroquel. Didn't respond to fluids. Placed on neo, weaned off.  Metoprolol dosing decreased.  Nocturnal hypotension seems to be a mix of Seroquel and Lopressor given at same time deemed ready for transfer to LTAC to continue weaning efforts 9/1-9/6, weaned off trach, de-cannulated, started food, working with PT/OT/SLP/RD to improve nutrition and strength     Discharge Plan by Active Problems      Acute respiratory failure with hypoxia due to NM weakness from  GBS S. Aureus pneumonia- RLL Resolved, flutter if able  Trach stoma should close within 2-3 weeks, refer to ENT if does not She should apply pressure to area for coughing, talking    Guillain Bare syndrome, idiopathic vs post-procedural from cardiac ablation with associated severe deconditioning and neuromuscular weakness -completed IVIG Continue aggressive PT/OT/SLP efforts   Paroxysmal atrial flutter  - amiodarone and apixiban - OP EP f/u   Urinary retention - foley reinserted 9/7 due to recurrent retention - hopefully as she mobilizes more this will improve - continue bethanechol   Relative malnutrition Dysphagia Celiac disease  Biggest issue has been her appetite, hopefully she can slowly wean off TF to full PO and get her cortrak out soon   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 Monitoring q48h, no issues here She is getting K supplementation daily, would DC if K starts trending higher   Anxiety Disturbed sleep wake cycles - Stable with qHS melatonin and seroquel - She does okay with low dose PRN ativan  Glaucoma -timolol   Discharge Exam: BP 104/60   Pulse 90   Temp 97.7 F (36.5 C) (Oral)   Resp 20   Ht 5' 4"  (1.626 m)   Wt 61.6 kg   LMP 04/20/2005   SpO2 96%   BMI 23.31 kg/m   No distress Cortrak in place MMM, trach stoma dressed without strikethrough Lungs diminished bases, normal RR Heart sounds regular, ext warm Strong voice Moves all 4 ext to command   Labs at discharge   Lab Results  Component Value Date   CREATININE 0.41 (L) 03/03/2021   BUN 15 03/03/2021   NA 130 (L) 03/03/2021   K 4.0 03/03/2021   CL 97 (L) 03/03/2021   CO2 26 03/03/2021   Lab Results  Component Value Date   WBC 5.6 02/25/2021   HGB 10.2 (L) 02/25/2021   HCT 31.3 (L) 02/25/2021   MCV 96.9 02/25/2021   PLT 341 02/25/2021   Lab Results  Component Value Date   ALT 12 02/07/2021   AST 23 02/07/2021   ALKPHOS 49 02/07/2021   BILITOT 1.0 02/07/2021   Lab Results   Component Value Date   INR 1.2 (H) 08/16/2016   INR 0.99 03/08/2016    Current radiological studies    No results found.  Disposition:     There are no questions and answers to display.         Allergies as of 03/04/2021       Reactions   Gluten Meal Other (See Comments)   celiac disease   Cefdinir Rash        Medication List     STOP taking these medications    Eliquis 5 MG Tabs tablet Generic drug: apixaban   gabapentin 300 MG capsule Commonly known as: NEURONTIN Replaced by: gabapentin 250 MG/5ML solution   ibuprofen 200 MG tablet Commonly known as: ADVIL   loratadine 10 MG tablet Commonly known as: CLARITIN   metoprolol succinate 25 MG 24 hr tablet Commonly known as: TOPROL-XL   psyllium 58.6 % packet Commonly known as: METAMUCIL   Systane 0.4-0.3 % Soln Generic drug: Polyethyl Glycol-Propyl Glycol   timolol 0.5 % ophthalmic solution Commonly known as: TIMOPTIC   Zioptan 0.0015 % Soln Generic drug: Tafluprost (PF) Replaced by: latanoprost 0.005 % ophthalmic solution       TAKE these medications    acetaminophen 160 MG/5ML solution Commonly known as: TYLENOL Take 20.3 mLs (650 mg total) by mouth every 6 (six) hours as needed for mild pain, moderate pain or fever.  albuterol (2.5 MG/3ML) 0.083% nebulizer solution Commonly known as: PROVENTIL Take 3 mLs (2.5 mg total) by nebulization every 4 (four) hours as needed for wheezing or shortness of breath.   amiodarone 200 MG tablet Commonly known as: PACERONE Take 1 tablet (200 mg total) by mouth daily.   bethanechol 10 MG tablet Commonly known as: URECHOLINE Take 2 tablets (20 mg total) by mouth 3 (three) times daily.   chlorhexidine 0.12 % solution Commonly known as: PERIDEX 15 mLs by Mouth Rinse route 2 (two) times daily.   Chlorhexidine Gluconate Cloth 2 % Pads Apply 6 each topically daily.   cyclobenzaprine 5 MG tablet Commonly known as: FLEXERIL Take 1 tablet (5 mg  total) by mouth 2 (two) times daily as needed for muscle spasms.   docusate 50 MG/5ML liquid Commonly known as: COLACE Take 10 mLs (100 mg total) by mouth 2 (two) times daily.   feeding supplement (PROSource TF) liquid Place 45 mLs into feeding tube 2 (two) times daily.   feeding supplement (OSMOLITE 1.5 CAL) Liqd Place 720 mLs into feeding tube daily.   gabapentin 250 MG/5ML solution Commonly known as: NEURONTIN Take 4 mLs (200 mg total) by mouth at bedtime. Replaces: gabapentin 300 MG capsule   Gerhardt's butt cream Crea Apply 1 application topically as needed for irritation.   guaiFENesin 100 MG/5ML Soln Commonly known as: ROBITUSSIN Take 15 mLs (300 mg total) by mouth every 6 (six) hours as needed for cough or to loosen phlegm.   latanoprost 0.005 % ophthalmic solution Commonly known as: XALATAN Place 1 drop into the left eye at bedtime. Replaces: Zioptan 0.0015 % Soln   lip balm Oint Apply 1 application topically as needed for lip care.   melatonin 3 MG Tabs tablet Take 1 tablet (3 mg total) by mouth at bedtime.   oxyCODONE 5 MG immediate release tablet Commonly known as: Oxy IR/ROXICODONE Take 0.5 tablets (2.5 mg total) by mouth every 3 (three) hours as needed for moderate pain.   pantoprazole 40 MG tablet Commonly known as: PROTONIX Take 1 tablet (40 mg total) by mouth daily.   polyethylene glycol 17 g packet Commonly known as: MIRALAX / GLYCOLAX Take 17 g by mouth daily.   polyvinyl alcohol 1.4 % ophthalmic solution Commonly known as: LIQUIFILM TEARS Place 1 drop into both eyes as needed for dry eyes.   potassium chloride 20 MEQ packet Commonly known as: KLOR-CON Take 60 mEq by mouth daily.   QUEtiapine 25 MG tablet Commonly known as: SEROQUEL Take 1 tablet (25 mg total) by mouth at bedtime.   senna-docusate 8.6-50 MG tablet Commonly known as: Senokot-S Take 1 tablet by mouth at bedtime as needed for mild constipation.         Follow-up  appointment   04/28/21 [facility]  Discharge Condition:    good  Physician Statement:   The Patient was personally examined, the discharge assessment and plan has been personally reviewed and I agree with ACNP Babcock's assessment and plan. 45 minutes of time have been dedicated to discharge assessment, planning and discharge instructions.   Signed: [name] 03/04/2021, 10:13 AM

## 2021-02-26 ENCOUNTER — Encounter (HOSPITAL_COMMUNITY)
Admission: EM | Disposition: A | Discharge status: Rehabilitation Facility | Payer: Self-pay | Source: Home / Self Care | Attending: Internal Medicine

## 2021-02-26 ENCOUNTER — Inpatient Hospital Stay (HOSPITAL_COMMUNITY): Payer: Medicare Other

## 2021-02-26 DIAGNOSIS — R935 Abnormal findings on diagnostic imaging of other abdominal regions, including retroperitoneum: Secondary | ICD-10-CM | POA: Diagnosis not present

## 2021-02-26 LAB — BASIC METABOLIC PANEL
Anion gap: 7 (ref 5–15)
BUN: 17 mg/dL (ref 8–23)
CO2: 26 mmol/L (ref 22–32)
Calcium: 7.8 mg/dL — ABNORMAL LOW (ref 8.9–10.3)
Chloride: 96 mmol/L — ABNORMAL LOW (ref 98–111)
Creatinine, Ser: <0.3 mg/dL — ABNORMAL LOW (ref 0.44–1.00)
Glucose, Bld: 129 mg/dL — ABNORMAL HIGH (ref 70–99)
Potassium: 4.1 mmol/L (ref 3.5–5.1)
Sodium: 129 mmol/L — ABNORMAL LOW (ref 135–145)

## 2021-02-26 LAB — GLUCOSE, CAPILLARY
Glucose-Capillary: 144 mg/dL — ABNORMAL HIGH (ref 70–99)
Glucose-Capillary: 121 mg/dL — ABNORMAL HIGH (ref 70–99)
Glucose-Capillary: 129 mg/dL — ABNORMAL HIGH (ref 70–99)
Glucose-Capillary: 130 mg/dL — ABNORMAL HIGH (ref 70–99)
Glucose-Capillary: 82 mg/dL (ref 70–99)
Glucose-Capillary: 103 mg/dL — ABNORMAL HIGH (ref 70–99)

## 2021-02-26 SURGERY — CARDIOVERSION
Anesthesia: General

## 2021-02-26 MED ORDER — IPRATROPIUM-ALBUTEROL 0.5-2.5 (3) MG/3ML IN SOLN: 3.0000 mL | Freq: Three times a day (TID) | RESPIRATORY_TRACT | Status: DC

## 2021-02-26 NOTE — Progress Notes (Addendum)
Patient ID: Angela Morales, female   DOB: [birth year], 75 y.o.   MRN: [id number]   NAME:  Angela Morales, MRN:  [id number], DOB:  [birth year], LOS: 41 ADMISSION DATE:  02/05/2021, CONSULTATION DATE:  02/07/21  REFERRING MD:  Dr Lupita Leash, CHIEF COMPLAINT:  tachypnea, hypoxemia   History of Present Illness:  75 year old woman with a history of atrial fibrillation (ablated 8/3), celiac disease, DDD who was admitted on 8/11 with bilateral upper and lower extremity weakness and paresthesias beginning about 8/8.  Developed some ascending weakness that resulted in inability to ambulate.  No other prodrome.  No clear precipitants although she was started on gabapentin about 1 month ago.  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  LP deferred initially as she was on Eliquis last dose 8/11.  IVIG started 8/12.  She has had progressive bilateral upper extremity weakness, some bulbar dysfunction difficulty swallowing, globus sensation with perception of swallowing and voice weakness.  FVC has been consistently greater than 1.0 L, NIF <-25.  She had an episode of desaturation, increased work of breathing and choking when eating Jell-O and pudding evening of 8/13.  Required intubation on 8/14.   Pertinent  Medical History   Past Medical History:  Diagnosis Date   Abnormal uterine bleeding    on HRT   Allergy    seasonal   Arthritis    Atrial fibrillation (HCC)    Celiac disease    DDD (degenerative disc disease)    Dysrhythmia    Glaucoma    Microscopic colitis    Osteoarthritis of both knees    PONV (postoperative nausea and vomiting)    SVT (supraventricular tachycardia) ([place])     Significant Hospital Events: Including procedures, antibiotic start and stop dates in addition to other pertinent events   IVIg started 8/12 CT CAP >> no dissection, bibasilar atelectasis, distended gallbladder with some pericholecystic stranding, segmental colitis question related to  diverticular disease RUQ ultrasound 8/12 >> distended gallbladder without stones or pericholecystic fluid no evidence cholecystitis MR cervical, thoracic, lumbar spine 8/12 > normal spinal cord no imaging evidence of demyelination or Guillain-Barr.  Advanced lumbar DJD, cervical DJD 8/13 Zosyn started for possible aspiration pneumonia 8/14: Intubated for progressive respiratory failure, poor bulbar function, and downtrending NIF and vital capacity sodium down to 115 from 124 but corrected to 119 with hyperglycemia.  115 was treated with hypertonic saline bolus.  IV sedating drips stopped. 8/15: IV sedating drips stopped, placed back on propofol in the evening hours.  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  Free water limited.  MRI neg.  8/16: day 5 of 5 IVIG.  Remains profoundly weak.  Tracheostomy,  Getting IV Lasix for volume overload.  [place] neg. 8/17: Remains in ICU, completed 5 days of zosyn.  8/18: started round 2 of  IVIG per neuro 8/19 fentanyl/ klonopin stopped (very sensitive/ too somnolent) 8/22 IVIG completed, strength slowly improving, tolerating trach collar intermittently during the day 8/25 trach collar 2hr BID. Staph and klebsiella in trach aspirate 8/26 feels weaker. Starting cefazolin  8/27 atrial flutter overnight. Got fluid challenge  8/28 amiodarone infusion initiated 8/29 tolerating ATC during day. Hypotensive at night->got fluid challenge  8/30 amiodarone transition to via tube.  Lovenox changed back to apixaban 8/31: episode of hypotension during the night with SBP in 70s after given metoprolol and seroquel. Didn't respond to fluids. Placed on neo. During the day, Heart rhythm A flutter rate low 100s. Cardioverted into sinus rhythm.  Interim History / Subjective:  Patient in [place] yesterday w/ rate low 100s. Cardioverted into normal sinus rhythm. Overnight patient BP were soft with SBP 80-90s.  Patient required to be put back on vent overnight for  exhaustion. Back on TCT this morning   Objective   Blood pressure 105/78, pulse 82, temperature 97.7 F (36.5 C), temperature source Oral, resp. rate (!) 24, height 5' 4"  (1.626 m), weight 64.9 kg, last menstrual period 04/20/2005, SpO2 100 %.    Vent Mode: PRVC FiO2 (%):  [28 %-100 %] 28 % Set Rate:  [16 bmp] 16 bmp Vt Set:  [430 mL] 430 mL PEEP:  [5 cmH20] 5 cmH20 Plateau Pressure:  [8 cmH20-10 cmH20] 8 cmH20   Intake/Output Summary (Last 24 hours) at 02/26/2021 0747 Last data filed at 02/26/2021 0630 Gross per 24 hour  Intake 1493.92 ml  Output 1600 ml  Net -106.08 ml    Filed Weights   02/24/21 0500 02/25/21 0500 02/26/21 0600  Weight: 65.3 kg 67.2 kg 64.9 kg   General:   NAD on TCT HEENT: MM pink/moist; trach in place Neuro: Aox3; MAE CV: s1s2, RRR, no m/r/g PULM:  dim clear BS bilaterally; on trach collar 35% w/ sats 100%; vent on standby for nighttime use GI: soft, bsx4 active  Extremities: warm/dry, no edema  Skin: no rashes or lesions   Labs:  Na 129  CXR: small bilateral infiltrates bilaterally from my interpretation  Resolved Hospital Problem list   Circulatory shock. Pressor dependent. Did meet SIRS criteria so w/ PNA will call this a mix of septic shock and also medication related hypotension Acute encephalopathy  SIADH  Assessment & Plan:   Acute respiratory failure with hypoxia due to NM weakness from GBS S. Aureus pneumonia- RLL P: -continue cefazolin: day 7 of 7. Last dose tonight -continue TCT as tolerated without vent overnight if tolerated -PMV trial as tolerated; plan to remove PMV overnight while resting and if SOB -trach care protocol in place -Pulm toilet: OOB and CPT TID -SLP will follow up today for swallow eval -trend CXR  Guillain Bare syndrome, idiopathic vs post-procedural from cardiac ablation -completed IVIG P: -supportive care -will likely need to placed in LTAC  Paroxysmal atrial flutter: 8/31: cardioverted into  NSR HTN P: -cont amio; will stop metoprolol due to hypotension overnight -cont apixaban -cont telemetry monitoring  Urinary retention P: -Cont Urecholine  -continue to monitor UOP  Dysphagia P: -continue tube feeds -SLP pending  Celiac disease  P: -Cont gluten free TF and needs gluten free diet going forward   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 A1c 5.0. P: -ssi and cbg monitoring  Anxiety Disturbed sleep wake cycles P: -Continue low-dose Seroquel -Cont Melatonin -PRN low dose ativan for break thru anxiety   Glaucoma P: -timolol   Best Practice (right click and "Reselect all SmartList Selections" daily)   Diet/type: EN via cortrak; slp eval today DVT prophylaxis:  doac GI prophylaxis: PPI Lines: N/A,  Foley:  N/A on Ureocholine for retention Code Status:  full code Last date of multidisciplinary goals of care discussion: full code, patient updated at bedside 9/1  My cct 35 min  JD Rexene Agent [facility] 02/26/2021, 7:47 AM  Please see [email or web address] for pager details.  From 7A-7P if no response, please call [phone]. After hours, please call ELink [phone].

## 2021-02-26 NOTE — Progress Notes (Signed)
Pt placed back on 28% trach collar. Currently sat 96% and resting comfortably. RT will cont to monitor.

## 2021-02-26 NOTE — Evaluation (Signed)
Clinical/Bedside Swallow Evaluation Patient Details  Name: Angela Morales MRN: [id number] Date of Birth: 1945-07-28  Today's Date: 02/26/2021 Time: SLP Start Time (ACUTE ONLY): 81 SLP Stop Time (ACUTE ONLY): 1500 SLP Time Calculation (min) (ACUTE ONLY): 17 min  Past Medical History:  Past Medical History:  Diagnosis Date   Abnormal uterine bleeding    on HRT   Allergy    seasonal   Arthritis    Atrial fibrillation (HCC)    Celiac disease    DDD (degenerative disc disease)    Dysrhythmia    Glaucoma    Microscopic colitis    Osteoarthritis of both knees    PONV (postoperative nausea and vomiting)    SVT (supraventricular tachycardia) ([place])    Past Surgical History:  Past Surgical History:  Procedure Laterality Date   APPENDECTOMY     ATRIAL FIBRILLATION ABLATION N/A 01/28/2021   Procedure: ATRIAL FIBRILLATION ABLATION;  Surgeon: Constance Haw, MD;  Location: [facility];  Service: Cardiovascular;  Laterality: N/A;   CARDIOVERSION N/A 10/30/2020   Procedure: CARDIOVERSION;  Surgeon: Jerline Pain, MD;  Location: [facility];  Service: Cardiovascular;  Laterality: N/A;   COLONOSCOPY     FOOT FRACTURE SURGERY  10/2013   TONSILLECTOMY AND ADENOIDECTOMY     TOTAL KNEE ARTHROPLASTY Left 03/22/2016   Procedure: TOTAL KNEE ARTHROPLASTY;  Surgeon: Dorna Leitz, MD;  Location: [facility];  Service: Orthopedics;  Laterality: Left;   TUBAL LIGATION  1981   VAGINAL HYSTERECTOMY  04/20/05   prolapse, adenomyosis   HPI:  75 y.o. female admitted 02/05/21 with c/o progressive BUE/BLE numbness and weakness; diagnosed with Guillain-Barr syndrome. Pt also with afib with RVR. Intubated 8/14 due to Acute respiratory failure with hypoxia w/ aspiration PNA and bibasilar atx.Mar[facility] Kitchen MRI negative 8/15. Trach placed 8/16. CT brain 8/16 negative.  PMH includes afib (recent ablation 01/28/21), celiac disease, DDD, gluacoma.   Assessment / Plan / Recommendation Clinical Impression  Pt seen for  clinical swallowing evaluation, alert with PMSV in place, on trach collar. Performed diligent oral care, some lingual coating appreciated, reducing some with oral care by SLP. Pt exhibited multiple swallows across POs (possibly suggesting reduced swallowing efficiency), suspected delay in swallow initiation per palpation, and overt coughing with thin liquids by cup sip. Given increased risk for aspiration with presence of tracheostomy and Guillain Barre syndrome, recommend proceed with instrumental assessment to guide readiness for diet initiation. SLP to follow up for completion of instrumental exam as scheduling allows.  SLP Visit Diagnosis: Dysphagia, unspecified (R13.10)    Aspiration Risk  Moderate aspiration risk    Diet Recommendation NPO, follow up MBSS    Medication Administration: Via alternative means    Other  Recommendations Oral Care Recommendations: Oral care QID   Follow up Recommendations Inpatient Rehab      Frequency and Duration min 2x/week  2 weeks       Prognosis Prognosis for Safe Diet Advancement: Good Barriers to Reach Goals: Time post onset      Swallow Study   General Date of Onset: 02/05/21 HPI: 75 y.o. female admitted 02/05/21 with c/o progressive BUE/BLE numbness and weakness; diagnosed with Guillain-Barr syndrome. Pt also with afib with RVR. Intubated 8/14 due to Acute respiratory failure with hypoxia w/ aspiration PNA and bibasilar atx.[place] Kitchen MRI negative 8/15. Trach placed 8/16. CT brain 8/16 negative.  PMH includes afib (recent ablation 01/28/21), celiac disease, DDD, gluacoma. Type of Study: Bedside Swallow Evaluation Previous Swallow Assessment: none on file Diet Prior  to this Study: NPO;NG Tube Temperature Spikes Noted: No Respiratory Status: Trach Collar History of Recent Intubation: No Behavior/Cognition: Alert;Cooperative;Pleasant mood Oral Cavity Assessment: Dry Oral Care Completed by SLP: Yes Oral Cavity - Dentition: Adequate natural  dentition Vision: Functional for self-feeding Self-Feeding Abilities: Total assist Patient Positioning: Upright in bed Baseline Vocal Quality: Low vocal intensity Volitional Cough: Strong Volitional Swallow: Able to elicit    Oral/Motor/Sensory Function Overall Oral Motor/Sensory Function: Mild impairment Facial ROM: Within Functional Limits Facial Symmetry: Within Functional Limits Facial Sensation: Within Functional Limits Lingual ROM: Within Functional Limits Lingual Symmetry: Within Functional Limits Lingual Strength: Reduced Velum: Within Functional Limits Mandible: Within Functional Limits   Ice Chips Ice chips: Impaired Presentation: Spoon Oral Phase Functional Implications: Prolonged oral transit Pharyngeal Phase Impairments: Suspected delayed Swallow;Multiple swallows   Thin Liquid Thin Liquid: Impaired Presentation: Cup Pharyngeal  Phase Impairments: Suspected delayed Swallow;Multiple swallows;Cough - Immediate;Cough - Delayed    Nectar Thick Nectar Thick Liquid: Impaired Presentation: Spoon;Cup Pharyngeal Phase Impairments: Suspected delayed Swallow;Multiple swallows;Throat Clearing - Delayed   Honey Thick Honey Thick Liquid: Not tested   Puree Puree: Impaired Presentation: Spoon Oral Phase Impairments: Reduced lingual movement/coordination Pharyngeal Phase Impairments: Multiple swallows   Solid     Solid: Not tested      Hayden Rasmussen MA, CCC-SLP Acute Rehabilitation Services   02/26/2021,3:18 PM

## 2021-02-26 NOTE — Care Management (Signed)
1500 02-26-21 Late Entry: Case Manager received notification that insurance asked for a Peer to Peer (P2P) today before noon. Per provider, the P2P was called in to [facility] and was denied by insurance since the patient was no longer on prolonged ventilation. Provider states he spoke with the patient to make her aware. Case Manager will continue to follow for disposition needs.

## 2021-02-27 ENCOUNTER — Inpatient Hospital Stay (HOSPITAL_COMMUNITY): Payer: Medicare Other

## 2021-02-27 DIAGNOSIS — G610 Guillain-Barre syndrome: Secondary | ICD-10-CM | POA: Diagnosis not present

## 2021-02-27 DIAGNOSIS — J9601 Acute respiratory failure with hypoxia: Secondary | ICD-10-CM | POA: Diagnosis not present

## 2021-02-27 DIAGNOSIS — I4891 Unspecified atrial fibrillation: Secondary | ICD-10-CM | POA: Diagnosis not present

## 2021-02-27 DIAGNOSIS — Z7901 Long term (current) use of anticoagulants: Secondary | ICD-10-CM | POA: Diagnosis not present

## 2021-02-27 LAB — BASIC METABOLIC PANEL
Anion gap: 6 (ref 5–15)
Anion gap: 7 (ref 5–15)
BUN: 14 mg/dL (ref 8–23)
BUN: 17 mg/dL (ref 8–23)
CO2: 26 mmol/L (ref 22–32)
CO2: 26 mmol/L (ref 22–32)
Calcium: 8.1 mg/dL — ABNORMAL LOW (ref 8.9–10.3)
Calcium: 8 mg/dL — ABNORMAL LOW (ref 8.9–10.3)
Chloride: 98 mmol/L (ref 98–111)
Chloride: 94 mmol/L — ABNORMAL LOW (ref 98–111)
Creatinine, Ser: 0.38 mg/dL — ABNORMAL LOW (ref 0.44–1.00)
Creatinine, Ser: 0.33 mg/dL — ABNORMAL LOW (ref 0.44–1.00)
GFR, Estimated: >60 mL/min (ref >60)
GFR, Estimated: >60 mL/min (ref >60)
Glucose, Bld: 117 mg/dL — ABNORMAL HIGH (ref 70–99)
Glucose, Bld: 136 mg/dL — ABNORMAL HIGH (ref 70–99)
Potassium: 4.1 mmol/L (ref 3.5–5.1)
Potassium: 4.3 mmol/L (ref 3.5–5.1)
Sodium: 130 mmol/L — ABNORMAL LOW (ref 135–145)
Sodium: 127 mmol/L — ABNORMAL LOW (ref 135–145)

## 2021-02-27 LAB — GLUCOSE, CAPILLARY
Glucose-Capillary: 107 mg/dL — ABNORMAL HIGH (ref 70–99)
Glucose-Capillary: 110 mg/dL — ABNORMAL HIGH (ref 70–99)
Glucose-Capillary: 113 mg/dL — ABNORMAL HIGH (ref 70–99)
Glucose-Capillary: 115 mg/dL — ABNORMAL HIGH (ref 70–99)
Glucose-Capillary: 120 mg/dL — ABNORMAL HIGH (ref 70–99)
Glucose-Capillary: 133 mg/dL — ABNORMAL HIGH (ref 70–99)
Glucose-Capillary: 142 mg/dL — ABNORMAL HIGH (ref 70–99)

## 2021-02-27 NOTE — PMR Pre-admission (Signed)
PMR Admission Coordinator Pre-Admission Assessment  Patient: Angela Morales is an 75 y.o., female MRN: [id number] DOB: 1946/01/10 Height: 5' 4"  (162.6 cm) Weight: 65.3 kg  Insurance Information HMO: yes    PPO:      PCP:      IPA:      80/20:      OTHER:  PRIMARY: [employer] Medicare      Policy#: [id number]      Subscriber: pt CM Name: Helene Kelp      Phone#: [phone]     Fax#: [phone] Pre-Cert#: [id number]  [facility] for CIR provided by Helene Kelp with Bernadene Bell with updates due to fax listed above on 9/13   Employer:  Benefits:  Phone #: [phone]     Name:  Eff. Date: 06/28/20     Deduct: $0      Out of Pocket Max: $3900 (met [phone])      Life Max: n/a CIR: $295/day for days 1-5      SNF: 20 full days Outpatient:      Co-Pay: $30/visit Home Health: 100%      Co-Pay:  DME: 80%     Co-Pay: 20% Providers:  SECONDARY:       Policy#:      Phone#:   Development worker, community:       Phone#:   The Therapist, art Information Summary" for patients in Inpatient Rehabilitation Facilities with attached "Privacy Act [place] Records" was provided and verbally reviewed with: Patient and Family  Emergency Contact Information Contact Information     Name Relation Home Work [place] Spouse [phone]  [phone]       Current Medical History  Patient Admitting Diagnosis: Guillain Barre Syndrome   History of Present Illness: pt is a 75 y/o female with PMH of afib, s/p ablation on 8/3, and celiac disease who was admitted to [facility] on 8/11 with bilat upper and bil lower extremeity weakness and parasthesias beginning 8/8.  Pt developed ascending weakness with inability to ambulate.  No other prodrome or precipitates other than being started on gabapentin 1 month prior.  LP was deferred initially as she was on Eliquis (last dose 8/11).  IVIG started empirically on 8/12 and she continued to have some progressive weakness and bulbar dysfunction with difficulty swallong and  globus sensation with swallow, and voice weakness.  FVC has been consistently greater than 1.0L , NIF <-25.  She had an episode of desaturation, increased work of breathing and choking when eating Jell-O and pudding evening of 8/13 and required intubation on 8/14.  Underwent trach on 8/16.  Started round 2 of IVIG on 8/18-8/22 per neuro recs.  Developed aflutter on 8/27 and started 8/28 amio drip.  Bouts of hypotension over night, not responding to increased fluids, though to be due to vasoplegia.  She was cardioverted into sinus rhythm on 8/31. Currently tolerating a regular, gluten free diet.  On 28% trach collar.  Therapy evaluations completed and pt was recommended for CIR.     [facility]'s [facility] record [facility] has been reviewed by the rehabilitation admission coordinator and physician.  Past Medical History  Past Medical History:  Diagnosis Date   Abnormal uterine bleeding    on HRT   Allergy    seasonal   Arthritis    Atrial fibrillation (HCC)    Celiac disease    DDD (degenerative disc disease)    Dysrhythmia    Glaucoma    Microscopic colitis    Osteoarthritis of both  knees    PONV (postoperative nausea and vomiting)    SVT (supraventricular tachycardia) (HCC)     Has the patient had major surgery during 100 days prior to admission? Yes  Family History   family history includes Bipolar disorder in her son; Breast cancer in her cousin; Heart disease (age of onset: 22) in her brother; Liver disease in her son; Osteoarthritis in her maternal grandmother; Osteoporosis in her mother; Prostate cancer in her father; Stroke in her mother.  Current Medications  Current Facility-Administered Medications:    0.9 %  sodium chloride infusion, , Intravenous, PRN, [name], MD, Stopped at 02/24/21 2338   0.9 %  sodium chloride infusion, 250 mL, Intravenous, Continuous, Ogan, Kerry Kass, MD, Stopped at 02/24/21 2321   0.9 %  sodium chloride infusion, 250 mL, Intravenous,  Continuous, Ogan, Kerry Kass, MD, Last Rate: 10 mL/hr at 02/25/21 0536, Restarted at 02/25/21 0536   acetaminophen (TYLENOL) 160 MG/5ML solution 650 mg, 650 mg, Oral, Q6H PRN, Minor, Grace Bushy, NP, 650 mg at 02/21/21 2122   albuterol (PROVENTIL) (2.5 MG/3ML) 0.083% nebulizer solution 2.5 mg, 2.5 mg, Nebulization, Q4H PRN, Minor, Grace Bushy, NP   amiodarone (PACERONE) tablet 200 mg, 200 mg, Per Tube, BID, 200 mg at 02/27/21 1117 **FOLLOWED BY** [START ON 03/03/2021] amiodarone (PACERONE) tablet 200 mg, 200 mg, Per Tube, Daily, Erick Colace, NP   apixaban Arne Cleveland) tablet 5 mg, 5 mg, Per Tube, BID, Lyndee Leo, RPH, 5 mg at 02/27/21 1117   bethanechol (URECHOLINE) tablet 10 mg, 10 mg, Per Tube, TID, Kipp Brood, MD, 10 mg at 02/27/21 1117   chlorhexidine gluconate (MEDLINE KIT) (PERIDEX) 0.12 % solution 15 mL, 15 mL, Mouth Rinse, BID, Noemi Chapel P, DO, 15 mL at 02/27/21 7425   Chlorhexidine Gluconate Cloth 2 % PADS 6 each, 6 each, Topical, Daily, Minor, Grace Bushy, NP, 6 each at 02/27/21 1119   cyclobenzaprine (FLEXERIL) tablet 5 mg, 5 mg, Per Tube, BID PRN, Bowser, Laurel Dimmer, NP   docusate (COLACE) 50 MG/5ML liquid 100 mg, 100 mg, Per Tube, BID, Noemi Chapel P, DO, 100 mg at 02/18/21 1025   feeding supplement (OSMOLITE 1.5 CAL) liquid 1,000 mL, 1,000 mL, Per Tube, Continuous, Julian Hy, DO, Last Rate: 50 mL/hr at 02/26/21 1248, 1,000 mL at 02/26/21 1248   feeding supplement (PROSource TF) liquid 45 mL, 45 mL, Per Tube, BID, Clark, Laura P, DO, 45 mL at 02/27/21 1118   gabapentin (NEURONTIN) 250 MG/5ML solution 200 mg, 200 mg, Per Tube, QHS, [name], MD, 200 mg at 02/26/21 2130   Gerhardt's butt cream, , Topical, PRN, Kipp Brood, MD, 1 application at 95/63/87 0800   guaiFENesin (ROBITUSSIN) 100 MG/5ML solution 300 mg, 15 mL, Per Tube, Q6H, Jennelle Human B, NP, 300 mg at 02/27/21 1118   insulin aspart (novoLOG) injection 0-15 Units, 0-15 Units, Subcutaneous, Q4H, Icard, Bradley  L, DO, 2 Units at 02/27/21 1352   labetalol (NORMODYNE) injection 10 mg, 10 mg, Intravenous, Q2H PRN, [name], MD, 10 mg at 02/12/21 1033   latanoprost (XALATAN) 0.005 % ophthalmic solution 1 drop, 1 drop, Left Eye, QHS, Minor, Grace Bushy, NP, 1 drop at 02/26/21 2225   lidocaine (LIDODERM) 5 % 1 patch, 1 patch, Transdermal, Q24H, Gleason, Otilio Carpen, PA-C, 1 patch at 02/26/21 1730   lip balm (BLISTEX) ointment, , Topical, PRN, [name], MD, Given at 02/16/21 1958   LORazepam (ATIVAN) injection 0.5-1 mg, 0.5-1 mg, Intravenous, Q4H PRN,  Frederik Pear, MD, 0.5 mg at 02/17/21 2313   MEDLINE mouth rinse, 15 mL, Mouth Rinse, 10 times per day, Noemi Chapel P, DO, 15 mL at 02/27/21 0531   melatonin tablet 3 mg, 3 mg, Per Tube, QHS, Lyndee Leo, RPH, 3 mg at 02/26/21 2225   oxyCODONE (Oxy IR/ROXICODONE) immediate release tablet 2.5 mg, 2.5 mg, Per Tube, Q3H PRN, [name], MD, 2.5 mg at 02/27/21 0531   pantoprazole sodium (PROTONIX) 40 mg/20 mL oral suspension 40 mg, 40 mg, Per Tube, Q1200, Erick Colace, NP, 40 mg at 02/27/21 1118   polyethylene glycol (MIRALAX / GLYCOLAX) packet 17 g, 17 g, Per Tube, Daily, Noemi Chapel P, DO, 17 g at 02/17/21 6384   polyvinyl alcohol (LIQUIFILM TEARS) 1.4 % ophthalmic solution 1 drop, 1 drop, Both Eyes, PRN, [name], MD, 1 drop at 02/20/21 6659   potassium chloride (KLOR-CON) packet 60 mEq, 60 mEq, Per Tube, Daily, Erick Colace, NP, 60 mEq at 02/27/21 1117   QUEtiapine (SEROQUEL) tablet 25 mg, 25 mg, Per Tube, QHS, Julian Hy, DO, 25 mg at 02/26/21 2225   senna-docusate (Senokot-S) tablet 1 tablet, 1 tablet, Per Tube, QHS PRN, Lyndee Leo, RPH   sodium chloride flush (NS) 0.9 % injection 10-40 mL, 10-40 mL, Intracatheter, Q12H, Minor, Grace Bushy, NP, 10 mL at 02/27/21 1119   sodium chloride flush (NS) 0.9 % injection 10-40 mL, 10-40 mL, Intracatheter, PRN, Minor, Grace Bushy, NP   timolol (TIMOPTIC) 0.5 % ophthalmic solution 1  drop, 1 drop, Left Eye, BID, Minor, Grace Bushy, NP, 1 drop at 02/27/21 1119  Patients Current Diet:  Diet Order             Diet gluten free Room service appropriate? Yes; Fluid consistency: Thin  Diet effective now                   Precautions / Restrictions Precautions Precautions: Fall Precaution Comments: trach, cortrak Restrictions Weight Bearing Restrictions: No   Has the patient had 2 or more falls or a fall with injury in the past year? No  Prior Activity Level Community (5-7x/wk): fully independent with no DME prior to admit, driving, watching grand kids  Prior Functional Level Self Care: Did the patient need help bathing, dressing, using the toilet or eating? Independent  Indoor Mobility: Did the patient need assistance with walking from room to room (with or without device)? Independent  Stairs: Did the patient need assistance with internal or external stairs (with or without device)? Independent  Functional Cognition: Did the patient need help planning regular tasks such as shopping or remembering to take medications? Independent  Patient Information Are you of Hispanic, Latino/a,or Spanish origin?: A. No, not of Hispanic, Latino/a, or Spanish origin What is your race?: A. White Do you need or want an interpreter to communicate with a doctor or health care staff?: 0. No  Patient's Response To:  Health Literacy and Transportation Is the patient able to respond to health literacy and transportation needs?: Yes Health Literacy - How often do you need to have someone help you when you read instructions, pamphlets, or other written material from your doctor or pharmacy?: Never In the past 12 months, has lack of transportation kept you from medical appointments or from getting medications?: No In the past 12 months, has lack of transportation kept you from meetings, work, or from getting things needed for daily living?: No  Home Assistive Devices /  Equipment Home Equipment: None  Prior Device Use: Indicate devices/aids used by the patient prior to current illness, exacerbation or injury? None of the above  Current Functional Level Cognition  Overall Cognitive Status: Within Functional Limits for tasks assessed Difficult to assess due to: Tracheostomy Orientation Level: Oriented X4 General Comments: pt following commands well, cues at times for motivation as pt fatigues    Extremity Assessment (includes Sensation/Coordination)  Upper Extremity Assessment: RUE deficits/detail, LUE deficits/detail RUE Deficits / Details: able to follow simple commands to test movement but <3/5 throughout UE, unable to raise at shoulder but able to control elbow/hand/wrist with increased time RUE Sensation: decreased light touch, decreased proprioception RUE Coordination: decreased fine motor, decreased gross motor LUE Deficits / Details: able to follow simple commands to test movement but <3/5 throughout UE, unable to raise at shoulder but able to control elbow/hand/wrist with increased time (extension >flexion) LUE Sensation: decreased light touch, decreased proprioception LUE Coordination: decreased fine motor, decreased gross motor  Lower Extremity Assessment: Defer to PT evaluation RLE Deficits / Details: BLEs grossly 0/5 toes, 1/5 ankle DF, 2/5 ankle PF, 2/5 knee extension, 2/5 hip abd/add, 1/5 hip flex; pt endorses no sensation on toes and plantar surface foot, light touch diminished throughout lower legs/feet (worse on L side); bilateral feet wanting to rest inverted and plantar flexed RLE Sensation: decreased light touch, decreased proprioception RLE Coordination: decreased fine motor, decreased gross motor LLE Deficits / Details: BLEs grossly 0/5 toes, 1/5 ankle DF, 2/5 ankle PF, 2/5 knee extension, 2/5 hip abd/add, 1/5 hip flex; pt endorses no sensation on toes and plantar surface foot, light touch diminished throughout lower legs/feet (worse on  L side); bilateral feet wanting to rest inverted and plantar flexed LLE Sensation: decreased light touch, decreased proprioception LLE Coordination: decreased fine motor, decreased gross motor    ADLs  Overall ADL's : Needs assistance/impaired Eating/Feeding: NPO Grooming: Minimal assistance, Sitting Grooming Details (indicate cue type and reason): min assist +2, unsupported sitting requires min-mod assist to maintain balance and comb hair with L hand Upper Body Dressing : Minimal assistance, Bed level Upper Body Dressing Details (indicate cue type and reason): able to thread BUEs into new gown with increased time Lower Body Dressing: Total assistance, Sitting/lateral leans Lower Body Dressing Details (indicate cue type and reason): assist for socks Toilet Transfer: Total assistance, +2 for safety/equipment, +2 for physical assistance Toilet Transfer Details (indicate cue type and reason): pt declined due to fatigue Toileting- Clothing Manipulation and Hygiene: +2 for physical assistance, Total assistance, +2 for safety/equipment, Bed level Functional mobility during ADLs: Total assistance, +2 for physical assistance, +2 for safety/equipment General ADL Comments: remains limited by decreased activity tolerance and endurance, impaired balance and weakness    Mobility  Overal bed mobility: Needs Assistance Bed Mobility: Rolling, Sidelying to Sit, Sit to Sidelying Rolling: Min assist Sidelying to sit: Max assist Supine to sit: Total assist, +2 for physical assistance Sit to supine: Total assist, +2 for physical assistance Sit to sidelying: Max assist, +2 for physical assistance General bed mobility comments: pt rolling with min assist, requires max assist for LB and min-mod assist for UB support given max cueing for sequecning and technique    Transfers  Overall transfer level: Needs assistance Equipment used: Ambulation equipment used Transfer via Lift Equipment: Stedy Transfers:  Lateral/Scoot Transfers Sit to Stand: Max assist, +2 physical assistance, +2 safety/equipment (+3 to monitor foot/ankle position) Stand pivot transfers: Total assist  Lateral/Scoot Transfers: Max assist, +2 physical assistance, +2  safety/equipment General transfer comment: pt declined due to fatigue    Ambulation / Gait / Stairs / Wheelchair Mobility  Ambulation/Gait General Gait Details: Unable to assess    Posture / Balance Dynamic Sitting Balance Sitting balance - Comments: min guard at best statically with up to min assist for dynamic sitting during ADLs Balance Overall balance assessment: Needs assistance Sitting-balance support: Bilateral upper extremity supported, Feet supported, No upper extremity supported Sitting balance-Leahy Scale: Poor Sitting balance - Comments: min guard at best statically with up to min assist for dynamic sitting during ADLs Standing balance-Leahy Scale: Zero    Special needs/care consideration Continuous Drip IV  osmolite 50 ml/hr, Oxygen 5L/28%, Trach size 6 cuffed, and Diabetic management yes   Previous Home Environment (from acute therapy documentation) Living Arrangements: Spouse/significant other, Children Available Help at Discharge: Family, Available PRN/intermittently Type of Home: House Home Layout: Multi-level, Able to live on main level with bedroom/bathroom, Full bath on main level Home Access: Stairs to enter Entrance Stairs-Rails: Left Entrance Stairs-Number of Steps: 3-4 Bathroom Shower/Tub: Tub/shower unit, Walk-in shower Additional Comments: Lives with husband and son; husband works-- per [employer] eval  Discharge Living Setting Plans for Discharge Living Setting: Patient's home, Lives with (comment) (spouse and adult son) Type of Home at Discharge: House Discharge Home Layout: Able to live on main level with bedroom/bathroom Discharge Home Access: Stairs to enter Entrance Stairs-Rails: Left Entrance Stairs-Number of Steps:  3-4 Discharge Bathroom Shower/Tub: Tub/shower unit, Walk-in shower Discharge Bathroom Toilet: Standard Discharge Bathroom Accessibility: Yes How Accessible: Accessible via walker, Accessible via wheelchair Does the patient have any problems obtaining your medications?: No  Social/Family/Support Systems Patient Roles: Spouse Anticipated Caregiver: Sakia Schrimpf Anticipated Caregiver's Contact Information: [phone] Caregiver Availability: 24/7 Discharge Plan Discussed with Primary Caregiver: Yes Is Caregiver In Agreement with Plan?: Yes Does Caregiver/Family have Issues with Lodging/Transportation while Pt is in Rehab?: No  Goals Patient/Family Goal for Rehab: PT/OT min assist w/c level, SLP supervision to mod I Expected length of stay: 28-32 days Pt/Family Agrees to Admission and willing to participate: Yes Program Orientation Provided & Reviewed with Pt/Caregiver Including Roles  & Responsibilities: Yes  Barriers to Discharge: Insurance for SNF coverage, Home environment access/layout  Decrease burden of Care through IP rehab admission: n/a  Possible need for SNF placement upon discharge: Not antipcated  Patient Condition: I have reviewed medical records from [facility], spoken with CM, and patient and spouse. I met with patient at the bedside and discussed via phone for inpatient rehabilitation assessment.  Patient will benefit from ongoing PT, OT, and SLP, can actively participate in 3 hours of therapy a day 5 days of the week, and can make measurable gains during the admission.  Patient will also benefit from the coordinated team approach during an Inpatient Acute Rehabilitation admission.  The patient will receive intensive therapy as well as Rehabilitation physician, nursing, social worker, and care management interventions.  Due to bladder management, bowel management, safety, skin/wound care, disease management, medication administration, pain management, and patient education  the patient requires 24 hour a day rehabilitation nursing.  The patient is currently max +2 with mobility and basic ADLs.  Discharge setting and therapy post discharge at home with home health is anticipated.  Patient has agreed to participate in the Acute Inpatient Rehabilitation Program and will admit today.  Preadmission Screen Completed By:  Michel Santee, PT, DPT 02/27/2021 2:23 PM ______________________________________________________________________   Discussed status with Dr. Posey Pronto on 10:01 AM  at .today  and  received approval for admission today.  Admission Coordinator:  Michel Santee, PT, DPT time 10:01 AM Sudie Grumbling 03/04/21    Assessment/Plan: Diagnosis: Guillain Barre Syndrome  Does the need for close, 24 hr/day Medical supervision in concert with the patient's rehab needs make it unreasonable for this patient to be served in a less intensive setting? Yes Co-Morbidities requiring supervision/potential complications: afib, s/p ablation on 8/3, celiac disease, supplemental oxygen dependent Due to bowel management, safety, skin/wound care, disease management, pain management, and patient education, does the patient require 24 hr/day rehab nursing? Yes Does the patient require coordinated care of a physician, rehab nurse, PT, OT to address physical and functional deficits in the context of the above medical diagnosis(es)? Yes Addressing deficits in the following areas: balance, endurance, locomotion, strength, transferring, bathing, dressing, toileting, swallowing, and psychosocial support Can the patient actively participate in an intensive therapy program of at least 3 hrs of therapy 5 days a week? Yes The potential for patient to make measurable gains while on inpatient rehab is excellent Anticipated functional outcomes upon discharge from inpatient rehab: supervision and min assist PT, supervision and min assist OT, modified independent SLP Estimated rehab length of stay to reach the  above functional goals is: 22-24 days. Anticipated discharge destination: Home 10. Overall Rehab/Functional Prognosis: good   MD Signature: Delice Lesch, MD, ABPMR

## 2021-02-27 NOTE — Progress Notes (Signed)
Inpatient Rehab Admissions Coordinator:   Met with patient at the bedside to discuss CIR goals and expectations.  Pt participating during PT/OT cotreat and doing well, though does fatigue quickly.  I spoke to her husband on the phone, as well, to provide and update.  We would expect estimated length of stay to be about 4 weeks, and goals would be min assist at the w/c level (meaning she would be able to complete 75% of any given task and using a w/c for mobility).  We will need insurance authorization and I discussed waiting to open until next week.  [employer] Medicare is closed on Monday for the holiday.  I think this will give her just a little more time to improve her endurance and set her up to better succeed in our program.  I will stop by her room one more time today and will f/u with her again on Tuesday.    Shann Medal, PT, DPT Admissions Coordinator [phone] 02/27/21  2:01 PM

## 2021-02-27 NOTE — Progress Notes (Signed)
Physical Therapy Treatment Patient Details Name: Angela Morales MRN: [id number] DOB: [birth year] Today's Date: 02/27/2021    History of Present Illness Pt is a 75 y.o. female admitted 02/05/21 with c/o progressive BUE/BLE numbness and weakness; diagnosed with Guillain-Barr syndrome. Pt also with afib with RVR. Intubated 8/14 due to Acute respiratory failure with hypoxia w/ aspiration PNA and bibasilar atx.Marland Kitchen MRI negative 8/15. Trach placed 8/16. CT brain 8/16 negative.  PMH includes afib (recent ablation 01/28/21), celiac disease, DDD, gluacoma, L TKA (2017).    PT Comments    The pt continues to make slow but steady progress with bed mobility and seated balance this session. Despite fatigue from swallow study this AM, the pt was able to demo progress in ability to assist with rolling and transition from sidelying to sitting at EOB. The pt continues to need cues and >75% assist to complete movement of LE and elevation of trunk from sidelying, but the pt was able to demo improved activation in all muscle groups to assist with transition. The pt was then able to complete seated balance tasks with minG to modA for support as well as cues for shifting wt forward and initiating momentum. Continue to recommend CIR level therapies at d/c given pt's continued improvements in mobility and goals for return to independence.     Follow Up Recommendations  CIR     Equipment Recommendations  Rolling walker with 5" wheels;Wheelchair (measurements PT);Wheelchair cushion (measurements PT);Hospital bed;Other (comment)    Recommendations for Other Services       Precautions / Restrictions Precautions Precautions: Fall Precaution Comments: trach, cortrak Restrictions Weight Bearing Restrictions: No    Mobility  Bed Mobility Overal bed mobility: Needs Assistance Bed Mobility: Rolling;Sidelying to Sit;Sit to Sidelying Rolling: Min assist Sidelying to sit: Max assist     Sit to sidelying: Max assist;+2  for physical assistance General bed mobility comments: pt rolling with min assist, requires max assist for LB and min-mod assist for UB support given max cueing for sequecning and technique    Transfers Overall transfer level: Needs assistance               General transfer comment: pt declined due to fatigue     Balance Overall balance assessment: Needs assistance Sitting-balance support: Bilateral upper extremity supported;Feet supported;No upper extremity supported Sitting balance-Leahy Scale: Poor Sitting balance - Comments: min guard at best statically with up to min assist for dynamic sitting during ADLs                                    Cognition Arousal/Alertness: Awake/alert Behavior During Therapy: WFL for tasks assessed/performed Overall Cognitive Status: Within Functional Limits for tasks assessed                                 General Comments: pt following commands well, cues at times for motivation as pt fatigues      Exercises General Exercises - Lower Extremity Ankle Circles/Pumps: AROM;Both;5 reps;Supine Other Exercises Other Exercises: lateral leans elbows for proximal strengthening and dynamic balance Other Exercises: cross body reaching outside base of support x 3 reps bilaterally for anterior shifting    General Comments General comments (skin integrity, edema, etc.): VSS on trach collar 5L 28%      Pertinent Vitals/Pain Pain Assessment: Faces Faces Pain Scale: Hurts little more Pain Location:  back, LE's Pain Descriptors / Indicators: Guarding;Grimacing;Pins and needles Pain Intervention(s): Limited activity within patient's tolerance;Monitored during session;Repositioned     PT Goals (current goals can now be found in the care plan section) Acute Rehab PT Goals Patient Stated Goal: get better. PT Goal Formulation: With patient/family Time For Goal Achievement: 03/07/21 Potential to Achieve Goals:  Good Progress towards PT goals: Progressing toward goals    Frequency    Min 4X/week      PT Plan Current plan remains appropriate    Co-evaluation PT/OT/SLP Co-Evaluation/Treatment: Yes Reason for Co-Treatment: Complexity of the patient's impairments (multi-system involvement);For patient/therapist safety;To address functional/ADL transfers PT goals addressed during session: Mobility/safety with mobility;Balance;Strengthening/ROM        AM-PAC PT "6 Clicks" Mobility   Outcome Measure  Help needed turning from your back to your side while in a flat bed without using bedrails?: A Little Help needed moving from lying on your back to sitting on the side of a flat bed without using bedrails?: A Lot Help needed moving to and from a bed to a chair (including a wheelchair)?: Total Help needed standing up from a chair using your arms (e.g., wheelchair or bedside chair)?: Total Help needed to walk in hospital room?: Total Help needed climbing 3-5 steps with a railing? : Total 6 Click Score: 9    End of Session Equipment Utilized During Treatment: Oxygen Activity Tolerance: Patient tolerated treatment well Patient left: with call bell/phone within reach;in bed;with bed alarm set Nurse Communication: Mobility status PT Visit Diagnosis: Other abnormalities of gait and mobility (R26.89);Muscle weakness (generalized) (M62.81);Other symptoms and signs involving the nervous system (R29.898) Hemiplegia - caused by: Unspecified     Time: 7340-3709 PT Time Calculation (min) (ACUTE ONLY): 33 min  Charges:  $Neuromuscular Re-education: 8-22 mins                     West Carbo, PT, DPT   Acute Rehabilitation Department Pager #: [phone]   Sandra Cockayne 02/27/2021, 2:06 PM

## 2021-02-27 NOTE — Progress Notes (Signed)
Patient ID: TAWANIA DAPONTE, female   DOB: [birth year], 75 y.o.   MRN: [id number]   NAME:  Angela Morales, MRN:  [id number], DOB:  1946/04/26, LOS: 42 ADMISSION DATE:  02/05/2021, CONSULTATION DATE:  02/07/21  REFERRING MD:  Dr Lupita Leash, CHIEF COMPLAINT:  tachypnea, hypoxemia   History of Present Illness:  75 year old woman with a history of atrial fibrillation (ablated 8/3), celiac disease, DDD who was admitted on 8/11 with bilateral upper and lower extremity weakness and paresthesias beginning about 8/8.  Developed some ascending weakness that resulted in inability to ambulate.  No other prodrome.  No clear precipitants although she was started on gabapentin about 1 month ago.  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  LP deferred initially as she was on Eliquis last dose 8/11.  IVIG started 8/12.  She has had progressive bilateral upper extremity weakness, some bulbar dysfunction difficulty swallowing, globus sensation with perception of swallowing and voice weakness.  FVC has been consistently greater than 1.0 L, NIF <-25.  She had an episode of desaturation, increased work of breathing and choking when eating Jell-O and pudding evening of 8/13.  Required intubation on 8/14.   Pertinent  Medical History   Past Medical History:  Diagnosis Date   Abnormal uterine bleeding    on HRT   Allergy    seasonal   Arthritis    Atrial fibrillation (HCC)    Celiac disease    DDD (degenerative disc disease)    Dysrhythmia    Glaucoma    Microscopic colitis    Osteoarthritis of both knees    PONV (postoperative nausea and vomiting)    SVT (supraventricular tachycardia) ([place])     Significant Hospital Events: Including procedures, antibiotic start and stop dates in addition to other pertinent events   IVIg started 8/12 CT CAP >> no dissection, bibasilar atelectasis, distended gallbladder with some pericholecystic stranding, segmental colitis question related to  diverticular disease RUQ ultrasound 8/12 >> distended gallbladder without stones or pericholecystic fluid no evidence cholecystitis MR cervical, thoracic, lumbar spine 8/12 > normal spinal cord no imaging evidence of demyelination or Guillain-Barr.  Advanced lumbar DJD, cervical DJD 8/13 Zosyn started for possible aspiration pneumonia 8/14: Intubated for progressive respiratory failure, poor bulbar function, and downtrending NIF and vital capacity sodium down to 115 from 124 but corrected to 119 with hyperglycemia.  115 was treated with hypertonic saline bolus.  IV sedating drips stopped. 8/15: IV sedating drips stopped, placed back on propofol in the evening hours.  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  Free water limited.  MRI neg.  8/16: day 5 of 5 IVIG.  Remains profoundly weak.  Tracheostomy,  Getting IV Lasix for volume overload.  [place] neg. 8/17: Remains in ICU, completed 5 days of zosyn.  8/18: started round 2 of  IVIG per neuro 8/19 fentanyl/ klonopin stopped (very sensitive/ too somnolent) 8/22 IVIG completed, strength slowly improving, tolerating trach collar intermittently during the day 8/25 trach collar 2hr BID. Staph and klebsiella in trach aspirate 8/26 feels weaker. Starting cefazolin  8/27 atrial flutter overnight. Got fluid challenge  8/28 amiodarone infusion initiated 8/29 tolerating ATC during day. Hypotensive at night->got fluid challenge  8/30 amiodarone transition to via tube.  Lovenox changed back to apixaban 8/31: episode of hypotension during the night with SBP in 70s after given metoprolol and seroquel. Didn't respond to fluids. Placed on neo. During the day, Heart rhythm A flutter rate low 100s. Cardioverted into sinus rhythm.  Interim History / Subjective:  No distress.  PMV in place, slept okay.  Objective   Blood pressure 116/66, pulse 82, temperature 98.6 F (37 C), temperature source Oral, resp. rate 16, height 5' 4"  (1.626 m), weight 65.3 kg,  last menstrual period 04/20/2005, SpO2 99 %.    FiO2 (%):  [28 %] 28 %   Intake/Output Summary (Last 24 hours) at 02/27/2021 0705 Last data filed at 02/27/2021 0600 Gross per 24 hour  Intake 1639.79 ml  Output 1400 ml  Net 239.79 ml    Filed Weights   02/25/21 0500 02/26/21 0600 02/27/21 0500  Weight: 67.2 kg 64.9 kg 65.3 kg   No distress Lungs diminished bases, normal RR Heart sounds regular, ext warm Strong voice Moves all 4 ext to command but weaker on L  Sodium 130 Patient Lines/Drains/Airways Status     Active Line/Drains/Airways     Name Placement date Placement time Site Days   Peripheral IV 02/22/21 22 G 1" Anterior;Right Forearm 02/22/21  1456  Forearm  5   External Urinary Catheter 02/24/21  1330  --  3   Small Bore Feeding Tube 10 Fr. Right nare Marking at nare/corner of mouth 65 cm 02/09/21  1353  Right nare  18   Tracheostomy Shiley Flexible 6 mm Cuffed 02/10/21  1230  6 mm  17             Resolved Hospital Problem list   Circulatory shock. Pressor dependent. Did meet SIRS criteria so w/ PNA will call this a mix of septic shock and also medication related hypotension Acute encephalopathy  SIADH  S. Aureus pneumonia- RLL s/p cefazolin x 7 days Assessment & Plan:   Acute respiratory failure with hypoxia due to NM weakness from GBS- improving post IVIG; GBS idiopathic vs. 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 Urinary retention Celiac disease Anxiety Glaucoma  - Continue indefinite TCT, pulmonary toileting measures as ordered - Continue working with SLP, gluten free diet - Continue amiodarone, eliquis, add midodrine - q48h sodium check - Watch another 24h in ICU then can transfer, will put in IPR consult to see if eligible  Best Practice (right click and "Reselect all SmartList Selections" daily)    Diet/type: EN via cortrak, remove when cleared by RD DVT prophylaxis:  doac GI prophylaxis: PPI Lines: N/A,  Foley:  N/A on Ureocholine for retention Code Status:  full code Last date of multidisciplinary goals of care discussion: full code, patient updated at bedside 9/2  Erskine Emery MD PCCM

## 2021-02-27 NOTE — Progress Notes (Signed)
RT in for routine trach check. Upon arrival pt asleep with PMV on. Pt educated on safety of PMV needing to be off when asleep/napping. Pt verbalizes understanding. PMV placed into green cup at pt bedside. RN notified of above findings. RT will continue to monitor and be available as needed.

## 2021-02-27 NOTE — Progress Notes (Signed)
Occupational Therapy Treatment Patient Details Name: Angela Morales MRN: [id number] DOB: [birth year] Today's Date: 02/27/2021    History of present illness Pt is a 75 y.o. female admitted 02/05/21 with c/o progressive BUE/BLE numbness and weakness; diagnosed with Guillain-Barr syndrome. Pt also with afib with RVR. Intubated 8/14 due to Acute respiratory failure with hypoxia w/ aspiration PNA and bibasilar atx.Marland Kitchen MRI negative 8/15. Trach placed 8/16. CT brain 8/16 negative.  PMH includes afib (recent ablation 01/28/21), celiac disease, DDD, gluacoma, L TKA (2017).   OT comments  Patient progressing well, updated dc plan to CIR. Patient reports increased fatigue due to having MBS this morning, but highly motivated and agreeable to EOB OT/PT session.  Completing bed mobility to EOB with +1 assist, back to supine with max +2.  EOB at best min guard, min assist during grooming.  Engaged in dynamic balance activities with up to min assist, but fatigues easily.  Remains total assist for LB ADLs. VSS on TC at 28% 5L.  Will follow acutely.    Follow Up Recommendations  CIR;Supervision/Assistance - 24 hour    Equipment Recommendations  Other (comment) (TBD)    Recommendations for Other Services Rehab consult    Precautions / Restrictions Precautions Precautions: Fall Precaution Comments: trach, cortrak Restrictions Weight Bearing Restrictions: No       Mobility Bed Mobility Overal bed mobility: Needs Assistance Bed Mobility: Rolling;Sidelying to Sit;Sit to Sidelying Rolling: Min assist Sidelying to sit: Max assist     Sit to sidelying: Max assist;+2 for physical assistance General bed mobility comments: pt rolling with min assist, requires max assist for LB and min-mod assist for UB support given max cueing for sequecning and technique    Transfers Overall transfer level: Needs assistance               General transfer comment: pt declined due to fatigue    Balance Overall  balance assessment: Needs assistance Sitting-balance support: Bilateral upper extremity supported;Feet supported;No upper extremity supported Sitting balance-Leahy Scale: Poor Sitting balance - Comments: min guard at best statically with up to min assist for dynamic sitting during ADLs                                   ADL either performed or assessed with clinical judgement   ADL Overall ADL's : Needs assistance/impaired     Grooming: Minimal assistance;Sitting Grooming Details (indicate cue type and reason): min assist +2, unsupported sitting requires min-mod assist to maintain balance and comb hair with L hand             Lower Body Dressing: Total assistance;Sitting/lateral leans Lower Body Dressing Details (indicate cue type and reason): assist for socks   Toilet Transfer Details (indicate cue type and reason): pt declined due to fatigue           General ADL Comments: remains limited by decreased activity tolerance and endurance, impaired balance and weakness     Vision       Perception     Praxis      Cognition Arousal/Alertness: Awake/alert Behavior During Therapy: WFL for tasks assessed/performed Overall Cognitive Status: Within Functional Limits for tasks assessed                                          Exercises Other Exercises  Other Exercises: lateral leans elbows for proximal strengthening and dynamic balance Other Exercises: cross body reaching outside base of support x 3 reps bilaterally for anterior shifting   Shoulder Instructions       General Comments VSS on trach collar 5L 28%    Pertinent Vitals/ Pain       Pain Assessment: Faces Faces Pain Scale: Hurts little more Pain Location: back, LE's Pain Descriptors / Indicators: Guarding;Grimacing;Pins and needles Pain Intervention(s): Limited activity within patient's tolerance;Monitored during session;Repositioned  Home Living                                           Prior Functioning/Environment              Frequency  Min 2X/week        Progress Toward Goals  OT Goals(current goals can now be found in the care plan section)  Progress towards OT goals: Progressing toward goals  Acute Rehab OT Goals Patient Stated Goal: get better. OT Goal Formulation: With patient  Plan Discharge plan remains appropriate;Frequency remains appropriate    Co-evaluation    PT/OT/SLP Co-Evaluation/Treatment: Yes Reason for Co-Treatment: Complexity of the patient's impairments (multi-system involvement)          AM-PAC OT "6 Clicks" Daily Activity     Outcome Measure   Help from another person eating meals?: Total Help from another person taking care of personal grooming?: A Little Help from another person toileting, which includes using toliet, bedpan, or urinal?: Total Help from another person bathing (including washing, rinsing, drying)?: A Lot Help from another person to put on and taking off regular upper body clothing?: A Lot Help from another person to put on and taking off regular lower body clothing?: Total 6 Click Score: 10    End of Session Equipment Utilized During Treatment: Oxygen  OT Visit Diagnosis: Other abnormalities of gait and mobility (R26.89);Muscle weakness (generalized) (M62.81);Pain;Other symptoms and signs involving cognitive function;Other symptoms and signs involving the nervous system (R29.898) Pain - Right/Left:  (bil) Pain - part of body: Leg (back)   Activity Tolerance Patient tolerated treatment well   Patient Left in bed;with call bell/phone within reach;with bed alarm set;with SCD's reapplied   Nurse Communication Mobility status        Time: 2800-3491 OT Time Calculation (min): 34 min  Charges: OT General Charges $OT Visit: 1 Visit OT Treatments $Self Care/Home Management : 8-22 mins  Jolaine Artist, OT Acute Rehabilitation Services Pager [phone] Office  [phone]    Delight Stare 02/27/2021, 1:42 PM

## 2021-02-27 NOTE — Progress Notes (Signed)
Modified Barium Swallow Progress Note  Patient Details  Name: Angela Morales MRN: [id number] Date of Birth: 05/07/1946  Today's Date: 02/27/2021  Modified Barium Swallow completed.  Full report located under Chart Review in the Imaging Section.  Brief recommendations include the following:  Clinical Impression  Pt demonstrates no oral or oropharyngeal dysphagia. No pharyngeal weakness identified. Barium tablet did lodge in valleculae and then esophagus, suspect removal of cortrak will resolve this.Test conducted with PMSV in place and off. Did not test regular as pt is gluten sensitive and no gluten free options available. However, oral phase WNL otherwise and pharyngeal strength WNL. Recommmend pt initiate a gluten free diet. Would benefit from soft choices af first, but cannot also order gluten free diet. Pt expected to make appropriate choices as cognition WNL. Will f/u for tolerance briefly with PMSV checks of tolerance and independence.   Swallow Evaluation Recommendations       SLP Diet Recommendations: Regular solids;Thin liquid   Liquid Administration via: Cup;Straw   Medication Administration: Whole meds with liquid   Supervision: Staff to assist with self feeding   Compensations: Slow rate;Small sips/bites   Postural Changes: Remain semi-upright after after feeds/meals (Comment);Seated upright at 90 degrees   Oral Care Recommendations: Oral care BID        Essie Lagunes, Katherene Ponto 02/27/2021,11:12 AM

## 2021-02-28 DIAGNOSIS — Z7901 Long term (current) use of anticoagulants: Secondary | ICD-10-CM | POA: Diagnosis not present

## 2021-02-28 DIAGNOSIS — J9601 Acute respiratory failure with hypoxia: Secondary | ICD-10-CM | POA: Diagnosis not present

## 2021-02-28 DIAGNOSIS — G610 Guillain-Barre syndrome: Secondary | ICD-10-CM | POA: Diagnosis not present

## 2021-02-28 DIAGNOSIS — I4891 Unspecified atrial fibrillation: Secondary | ICD-10-CM | POA: Diagnosis not present

## 2021-02-28 LAB — GLUCOSE, CAPILLARY
Glucose-Capillary: 100 mg/dL — ABNORMAL HIGH (ref 70–99)
Glucose-Capillary: 105 mg/dL — ABNORMAL HIGH (ref 70–99)
Glucose-Capillary: 114 mg/dL — ABNORMAL HIGH (ref 70–99)
Glucose-Capillary: 122 mg/dL — ABNORMAL HIGH (ref 70–99)
Glucose-Capillary: 88 mg/dL (ref 70–99)
Glucose-Capillary: 97 mg/dL (ref 70–99)

## 2021-02-28 MED ORDER — ORAL CARE MOUTH RINSE
15.0000 mL | Freq: Two times a day (BID) | OROMUCOSAL | Status: DC
  Administered 2021-02-28 – 2021-03-03 (×6): 15 mL via OROMUCOSAL

## 2021-02-28 MED ORDER — CHLORHEXIDINE GLUCONATE 0.12 % MT SOLN
15.0000 mL | Freq: Two times a day (BID) | OROMUCOSAL | Status: DC
  Administered 2021-02-28 – 2021-03-04 (×9): 15 mL via OROMUCOSAL
  Filled 2021-02-28 (×8): qty 15

## 2021-02-28 MED ORDER — CHLORHEXIDINE GLUCONATE CLOTH 2 % EX PADS
6.0000 | MEDICATED_PAD | CUTANEOUS | Status: DC
  Administered 2021-02-28 – 2021-03-04 (×4): 6 via TOPICAL

## 2021-02-28 MED ORDER — CHLORHEXIDINE GLUCONATE CLOTH 2 % EX PADS: 6.0000 | MEDICATED_PAD | CUTANEOUS | Status: DC

## 2021-02-28 NOTE — Progress Notes (Signed)
Patient ID: TAMITHA NORELL, female   DOB: [birth year], 75 y.o.   MRN: [id number]   NAME:  Angela Morales, MRN:  [id number], DOB:  20-Jul-1945, LOS: 58 ADMISSION DATE:  02/05/2021, CONSULTATION DATE:  02/07/21  REFERRING MD:  Dr Lupita Leash, CHIEF COMPLAINT:  tachypnea, hypoxemia   History of Present Illness:  75 year old woman with a history of atrial fibrillation (ablated 8/3), celiac disease, DDD who was admitted on 8/11 with bilateral upper and lower extremity weakness and paresthesias beginning about 8/8.  Developed some ascending weakness that resulted in inability to ambulate.  No other prodrome.  No clear precipitants although she was started on gabapentin about 1 month ago.  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  LP deferred initially as she was on Eliquis last dose 8/11.  IVIG started 8/12.  She has had progressive bilateral upper extremity weakness, some bulbar dysfunction difficulty swallowing, globus sensation with perception of swallowing and voice weakness.  FVC has been consistently greater than 1.0 L, NIF <-25.  She had an episode of desaturation, increased work of breathing and choking when eating Jell-O and pudding evening of 8/13.  Required intubation on 8/14.   Pertinent  Medical History   Past Medical History:  Diagnosis Date   Abnormal uterine bleeding    on HRT   Allergy    seasonal   Arthritis    Atrial fibrillation (HCC)    Celiac disease    DDD (degenerative disc disease)    Dysrhythmia    Glaucoma    Microscopic colitis    Osteoarthritis of both knees    PONV (postoperative nausea and vomiting)    SVT (supraventricular tachycardia) ([place])     Significant Hospital Events: Including procedures, antibiotic start and stop dates in addition to other pertinent events   IVIg started 8/12 CT CAP >> no dissection, bibasilar atelectasis, distended gallbladder with some pericholecystic stranding, segmental colitis question related to  diverticular disease RUQ ultrasound 8/12 >> distended gallbladder without stones or pericholecystic fluid no evidence cholecystitis MR cervical, thoracic, lumbar spine 8/12 > normal spinal cord no imaging evidence of demyelination or Guillain-Barr.  Advanced lumbar DJD, cervical DJD 8/13 Zosyn started for possible aspiration pneumonia 8/14: Intubated for progressive respiratory failure, poor bulbar function, and downtrending NIF and vital capacity sodium down to 115 from 124 but corrected to 119 with hyperglycemia.  115 was treated with hypertonic saline bolus.  IV sedating drips stopped. 8/15: IV sedating drips stopped, placed back on propofol in the evening hours.  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  Free water limited.  MRI neg.  8/16: day 5 of 5 IVIG.  Remains profoundly weak.  Tracheostomy,  Getting IV Lasix for volume overload.  [place] neg. 8/17: Remains in ICU, completed 5 days of zosyn.  8/18: started round 2 of  IVIG per neuro 8/19 fentanyl/ klonopin stopped (very sensitive/ too somnolent) 8/22 IVIG completed, strength slowly improving, tolerating trach collar intermittently during the day 8/25 trach collar 2hr BID. Staph and klebsiella in trach aspirate 8/26 feels weaker. Starting cefazolin  8/27 atrial flutter overnight. Got fluid challenge  8/28 amiodarone infusion initiated 8/29 tolerating ATC during day. Hypotensive at night->got fluid challenge  8/30 amiodarone transition to via tube.  Lovenox changed back to apixaban 8/31: episode of hypotension during the night with SBP in 70s after given metoprolol and seroquel. Didn't respond to fluids. Placed on neo. During the day, Heart rhythm A flutter rate low 100s. Cardioverted into sinus rhythm.  Interim History / Subjective:  No distress.  Not eating much (not hungry), TF ongoing  Objective   Blood pressure 130/62, pulse 81, temperature 98.7 F (37.1 C), temperature source Oral, resp. rate 19, height 5' 4"  (1.626 m),  weight 62.2 kg, last menstrual period 04/20/2005, SpO2 99 %.    FiO2 (%):  [28 %] 28 %   Intake/Output Summary (Last 24 hours) at 02/28/2021 0859 Last data filed at 02/28/2021 0600 Gross per 24 hour  Intake 550 ml  Output 2000 ml  Net -1450 ml    Filed Weights   02/26/21 0600 02/27/21 0500 02/28/21 0500  Weight: 64.9 kg 65.3 kg 62.2 kg   No distress Cortrak in place MMM Lungs diminished bases, normal RR Heart sounds regular, ext warm Strong voice Moves all 4 ext to command but weaker on L Unable to sit up on own, needs 3 assist for stand    Resolved Hospital Problem list   Circulatory shock. Pressor dependent. Did meet SIRS criteria so w/ PNA will call this a mix of septic shock and also medication related hypotension Acute encephalopathy  SIADH  S. Aureus pneumonia- RLL s/p cefazolin x 7 days Assessment & Plan:   Acute respiratory failure with hypoxia due to NM weakness from GBS- improving post IVIG; GBS idiopathic vs. 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

## 2021-03-01 DIAGNOSIS — G610 Guillain-Barre syndrome: Secondary | ICD-10-CM | POA: Diagnosis not present

## 2021-03-01 LAB — BASIC METABOLIC PANEL
Anion gap: 5 (ref 5–15)
BUN: 17 mg/dL (ref 8–23)
CO2: 29 mmol/L (ref 22–32)
Calcium: 8.5 mg/dL — ABNORMAL LOW (ref 8.9–10.3)
Chloride: 96 mmol/L — ABNORMAL LOW (ref 98–111)
Creatinine, Ser: 0.39 mg/dL — ABNORMAL LOW (ref 0.44–1.00)
GFR, Estimated: >60 mL/min (ref >60)
Glucose, Bld: 103 mg/dL — ABNORMAL HIGH (ref 70–99)
Potassium: 4.1 mmol/L (ref 3.5–5.1)
Sodium: 130 mmol/L — ABNORMAL LOW (ref 135–145)

## 2021-03-01 LAB — GLUCOSE, CAPILLARY
Glucose-Capillary: 124 mg/dL — ABNORMAL HIGH (ref 70–99)
Glucose-Capillary: 125 mg/dL — ABNORMAL HIGH (ref 70–99)
Glucose-Capillary: 127 mg/dL — ABNORMAL HIGH (ref 70–99)
Glucose-Capillary: 89 mg/dL (ref 70–99)
Glucose-Capillary: 90 mg/dL (ref 70–99)
Glucose-Capillary: 96 mg/dL (ref 70–99)

## 2021-03-01 NOTE — Plan of Care (Signed)

## 2021-03-01 NOTE — Progress Notes (Signed)
Patient ID: Angela Morales, female   DOB: 01-13-1946, 75 y.o.   MRN: [id number]   NAME:  BUFFI EWTON, MRN:  [id number], DOB:  1946/06/10, LOS: 40 ADMISSION DATE:  02/05/2021, CONSULTATION DATE:  02/07/21  REFERRING MD:  Dr Lupita Leash, CHIEF COMPLAINT:  tachypnea, hypoxemia   History of Present Illness:  74 year old woman with a history of atrial fibrillation (ablated 8/3), celiac disease, DDD who was admitted on 8/11 with bilateral upper and lower extremity weakness and paresthesias beginning about 8/8.  Developed some ascending weakness that resulted in inability to ambulate.  No other prodrome.  No clear precipitants although she was started on gabapentin about 1 month ago.  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  LP deferred initially as she was on Eliquis last dose 8/11.  IVIG started 8/12.  She has had progressive bilateral upper extremity weakness, some bulbar dysfunction difficulty swallowing, globus sensation with perception of swallowing and voice weakness.  FVC has been consistently greater than 1.0 L, NIF <-25.  She had an episode of desaturation, increased work of breathing and choking when eating Jell-O and pudding evening of 8/13.  Required intubation on 8/14.   Pertinent  Medical History   Past Medical History:  Diagnosis Date   Abnormal uterine bleeding    on HRT   Allergy    seasonal   Arthritis    Atrial fibrillation (HCC)    Celiac disease    DDD (degenerative disc disease)    Dysrhythmia    Glaucoma    Microscopic colitis    Osteoarthritis of both knees    PONV (postoperative nausea and vomiting)    SVT (supraventricular tachycardia) ([place])     Significant Hospital Events: Including procedures, antibiotic start and stop dates in addition to other pertinent events   IVIg started 8/12 CT CAP >> no dissection, bibasilar atelectasis, distended gallbladder with some pericholecystic stranding, segmental colitis question related to  diverticular disease RUQ ultrasound 8/12 >> distended gallbladder without stones or pericholecystic fluid no evidence cholecystitis MR cervical, thoracic, lumbar spine 8/12 > normal spinal cord no imaging evidence of demyelination or Guillain-Barr.  Advanced lumbar DJD, cervical DJD 8/13 Zosyn started for possible aspiration pneumonia 8/14: Intubated for progressive respiratory failure, poor bulbar function, and downtrending NIF and vital capacity sodium down to 115 from 124 but corrected to 119 with hyperglycemia.  115 was treated with hypertonic saline bolus.  IV sedating drips stopped. 8/15: IV sedating drips stopped, placed back on propofol in the evening hours.  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  Free water limited.  MRI neg.  8/16: day 5 of 5 IVIG.  Remains profoundly weak.  Tracheostomy,  Getting IV Lasix for volume overload.  [place] neg. 8/17: Remains in ICU, completed 5 days of zosyn.  8/18: started round 2 of  IVIG per neuro 8/19 fentanyl/ klonopin stopped (very sensitive/ too somnolent) 8/22 IVIG completed, strength slowly improving, tolerating trach collar intermittently during the day 8/25 trach collar 2hr BID. Staph and klebsiella in trach aspirate 8/26 feels weaker. Starting cefazolin  8/27 atrial flutter overnight. Got fluid challenge  8/28 amiodarone infusion initiated 8/29 tolerating ATC during day. Hypotensive at night->got fluid challenge  8/30 amiodarone transition to via tube.  Lovenox changed back to apixaban 8/31: episode of hypotension during the night with SBP in 70s after given metoprolol and seroquel. Didn't respond to fluids. Placed on neo. During the day, Heart rhythm A flutter rate low 100s. Cardioverted into sinus rhythm.  Interim History / Subjective:  No events No change in strength  Objective   Blood pressure 114/65, pulse 84, temperature 97.7 F (36.5 C), temperature source Axillary, resp. rate 12, height 5' 4"  (1.626 m), weight 64.6 kg, last  menstrual period 04/20/2005, SpO2 100 %.    FiO2 (%):  [28 %] 28 %   Intake/Output Summary (Last 24 hours) at 03/01/2021 1022 Last data filed at 03/01/2021 0700 Gross per 24 hour  Intake 1611.25 ml  Output 2000 ml  Net -388.75 ml    Filed Weights   02/27/21 0500 02/28/21 0500 03/01/21 0600  Weight: 65.3 kg 62.2 kg 64.6 kg   No distress Cortrak in place MMM Lungs diminished bases, normal RR Heart sounds regular, ext warm Strong voice Moves all 4 ext to command but weaker on [place] Hospital Problem list   Circulatory shock. Pressor dependent. Did meet SIRS criteria so w/ PNA will call this a mix of septic shock and also medication related hypotension Acute encephalopathy  SIADH  S. Aureus pneumonia- RLL s/p cefazolin x 7 days Assessment & Plan:   Acute respiratory failure with hypoxia due to NM weakness from GBS- improving post IVIG; GBS idiopathic vs. 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

## 2021-03-02 LAB — GLUCOSE, CAPILLARY
Glucose-Capillary: 104 mg/dL — ABNORMAL HIGH (ref 70–99)
Glucose-Capillary: 106 mg/dL — ABNORMAL HIGH (ref 70–99)
Glucose-Capillary: 108 mg/dL — ABNORMAL HIGH (ref 70–99)
Glucose-Capillary: 110 mg/dL — ABNORMAL HIGH (ref 70–99)
Glucose-Capillary: 111 mg/dL — ABNORMAL HIGH (ref 70–99)
Glucose-Capillary: 117 mg/dL — ABNORMAL HIGH (ref 70–99)
Glucose-Capillary: 98 mg/dL (ref 70–99)

## 2021-03-02 MED ORDER — DOCUSATE SODIUM 50 MG/5ML PO LIQD
100.0000 mg | Freq: Two times a day (BID) | ORAL | Status: DC
  Administered 2021-03-02: 100 mg via ORAL
  Filled 2021-03-02 (×3): qty 10

## 2021-03-02 MED ORDER — PANTOPRAZOLE SODIUM 40 MG PO TBEC
40.0000 mg | DELAYED_RELEASE_TABLET | Freq: Every day | ORAL | Status: DC
  Administered 2021-03-03 – 2021-03-04 (×2): 40 mg via ORAL
  Filled 2021-03-02 (×2): qty 1

## 2021-03-02 MED ORDER — POLYETHYLENE GLYCOL 3350 17 G PO PACK
17.0000 g | PACK | Freq: Every day | ORAL | Status: DC
  Administered 2021-03-03 – 2021-03-04 (×2): 17 g via ORAL
  Filled 2021-03-02 (×2): qty 1

## 2021-03-02 MED ORDER — GUAIFENESIN 100 MG/5ML PO SOLN
15.0000 mL | Freq: Four times a day (QID) | ORAL | Status: DC
  Administered 2021-03-02 – 2021-03-04 (×8): 300 mg via ORAL
  Filled 2021-03-02 (×3): qty 15
  Filled 2021-03-02: qty 5
  Filled 2021-03-02 (×4): qty 15
  Filled 2021-03-02: qty 10

## 2021-03-02 MED ORDER — POTASSIUM CHLORIDE 20 MEQ PO PACK
60.0000 meq | PACK | Freq: Every day | ORAL | Status: DC
  Administered 2021-03-03 – 2021-03-04 (×2): 60 meq via ORAL
  Filled 2021-03-02 (×2): qty 3

## 2021-03-02 MED ORDER — QUETIAPINE FUMARATE 25 MG PO TABS
25.0000 mg | ORAL_TABLET | Freq: Every day | ORAL | Status: DC
  Administered 2021-03-02 – 2021-03-03 (×2): 25 mg via ORAL
  Filled 2021-03-02 (×2): qty 1

## 2021-03-02 MED ORDER — MELATONIN 3 MG PO TABS
3.0000 mg | ORAL_TABLET | Freq: Every day | ORAL | Status: DC
  Administered 2021-03-02 – 2021-03-03 (×2): 3 mg via ORAL
  Filled 2021-03-02 (×2): qty 1

## 2021-03-02 MED ORDER — OSMOLITE 1.5 CAL PO LIQD
1000.0000 mL | ORAL | Status: DC
  Administered 2021-03-02: 1000 mL

## 2021-03-02 MED ORDER — AMIODARONE HCL 200 MG PO TABS
200.0000 mg | ORAL_TABLET | Freq: Every day | ORAL | Status: DC
  Administered 2021-03-03 – 2021-03-04 (×2): 200 mg via ORAL
  Filled 2021-03-02 (×2): qty 1

## 2021-03-02 MED ORDER — AMIODARONE HCL 200 MG PO TABS
200.0000 mg | ORAL_TABLET | Freq: Two times a day (BID) | ORAL | Status: AC
  Administered 2021-03-02: 200 mg via ORAL
  Filled 2021-03-02: qty 1

## 2021-03-02 MED ORDER — APIXABAN 5 MG PO TABS
5.0000 mg | ORAL_TABLET | Freq: Two times a day (BID) | ORAL | Status: DC
  Administered 2021-03-02 – 2021-03-04 (×4): 5 mg via ORAL
  Filled 2021-03-02 (×4): qty 1

## 2021-03-02 MED ORDER — GABAPENTIN 250 MG/5ML PO SOLN
200.0000 mg | Freq: Every day | ORAL | Status: DC
  Administered 2021-03-02 – 2021-03-03 (×2): 200 mg via ORAL
  Filled 2021-03-02 (×3): qty 4

## 2021-03-02 MED ORDER — BETHANECHOL CHLORIDE 10 MG PO TABS
10.0000 mg | ORAL_TABLET | Freq: Three times a day (TID) | ORAL | Status: DC
  Administered 2021-03-02 – 2021-03-03 (×4): 10 mg via ORAL
  Filled 2021-03-02 (×4): qty 1

## 2021-03-02 NOTE — Progress Notes (Signed)
Occupational Therapy Treatment Patient Details Name: Angela Morales MRN: [id number] DOB: [birth year] Today's Date: 03/02/2021    History of present illness 75 y.o. female admitted 02/05/21 with c/o progressive BUE/BLE numbness and weakness; diagnosed with Guillain-Barr syndrome. Pt also with afib with RVR. Intubated 8/14 due to Acute respiratory failure with hypoxia w/ aspiration PNA and bibasilar atx.[facility] MRI negative 8/15. Trach placed 8/16. CT brain 8/16 negative.  PMH includes afib (recent ablation 01/28/21), celiac disease, DDD, gluacoma, and L TKA (2017).   OT comments    Pt progressing towards established OT goals. Continues to be motivated to participate despite fatigue and pain. Pt performing sit<>stands from EOB with Max A +2. Pt requiring support at hips and bilateral knees; use of chair in front for BUE support. Pt participating in BUE exercises focusing on pinch strength, sensation, coordination, proprioception, and activity tolerance. Continue to recommend dc to CIR and will continue to follow acutely as admitted.      Follow Up Recommendations  CIR;Supervision/Assistance - 24 hour    Equipment Recommendations  Other (comment) (Defer to next venue)    Recommendations for Other Services Rehab consult    Precautions / Restrictions Precautions Precautions: Fall Precaution Comments: trach, cortrak Restrictions Weight Bearing Restrictions: No       Mobility Bed Mobility Overal bed mobility: Needs Assistance Bed Mobility: Rolling;Sidelying to Sit;Sit to Sidelying Rolling: Min assist Sidelying to sit: Mod assist;+2 for safety/equipment     Sit to sidelying: Max assist;+2 for physical assistance;+2 for safety/equipment General bed mobility comments: minA with increased time to complete rolling and reaching, cues for initiation of movement with LE and UE to complete rolling, once in position, pt able to maintain with bed rail. mod-maxA to complete sidelying-sit as pt needing  increased time and cues to complete movement with LE as well as assist and sequential cues to elevate and lower trunk to bed    Transfers Overall transfer level: Needs assistance Equipment used: 2 person hand held assist Transfers: Sit to/from Stand Sit to Stand: Max assist;+2 physical assistance (+3 for posterior support)         General transfer comment: completed x2, once with bilateral knee block, assist at hips via bed pad, and pt holding therapists arms. pt with noted bilateral knee hyperexxtension. second attempt with therapists seated to pt's side to block both knee buckling and hyperextension, assist of 3rd person to generate hip lift, pt then able to bear wt through BUE  to maintain upright    Balance Overall balance assessment: Needs assistance Sitting-balance support: Bilateral upper extremity supported;Feet supported;No upper extremity supported Sitting balance-Leahy Scale: Poor Sitting balance - Comments: min guard at best statically with up to maxA posterior support for pt to rest   Standing balance support: Bilateral upper extremity supported;During functional activity Standing balance-Leahy Scale: Zero Standing balance comment: maxA from therapists and BUE support                           ADL either performed or assessed with clinical judgement   ADL Overall ADL's : Needs assistance/impaired                     Lower Body Dressing: Total assistance;Bed level Lower Body Dressing Details (indicate cue type and reason): assist for socks     Toileting- Clothing Manipulation and Hygiene: Total assistance;+2 for physical assistance Toileting - Clothing Manipulation Details (indicate cue type and reason): Peri care while sidelying  in bed     Functional mobility during ADLs: Maximal assistance;+2 for physical assistance (sit<>stand) General ADL Comments: Focused session on sit<>stand at EOB and then participating in exercises for UEs.     Vision        Perception     Praxis      Cognition Arousal/Alertness: Awake/alert Behavior During Therapy: WFL for tasks assessed/performed Overall Cognitive Status: Within Functional Limits for tasks assessed                                 General Comments: pt following commands well, cues at times for motivation as pt fatigues        Exercises Exercises: Other exercises Other Exercises Other Exercises: ankle DF with eversion x10 each leg Other Exercises: Stacking of 6 small cups; making pyramid; Two trials with LUE (dominant hand). one trial with RUE. Cues for correctp ositioning and pinch. Pt with decreased coroinsation, sensation, pinch strength, and proprioception Other Exercises: Pt folding towel x3; focused on pinch strength, coorindation, activity toelrance, and texture introduction.   Shoulder Instructions       General Comments VSS on RA, pt with BM with mobility.    Pertinent Vitals/ Pain       Pain Assessment: Faces Faces Pain Scale: Hurts little more Pain Location: groin, back Pain Descriptors / Indicators: Guarding;Grimacing;Pins and needles Pain Intervention(s): Monitored during session;Limited activity within patient's tolerance;Repositioned  Home Living                                          Prior Functioning/Environment              Frequency  Min 2X/week        Progress Toward Goals  OT Goals(current goals can now be found in the care plan section)  Progress towards OT goals: Progressing toward goals  Acute Rehab OT Goals Patient Stated Goal: get better. OT Goal Formulation: With patient Time For Goal Achievement: 03/09/21 Potential to Achieve Goals: Good ADL Goals Pt Will Perform Grooming: sitting;with min assist Pt Will Perform Upper Body Bathing: with min assist;sitting Pt Will Transfer to Toilet: with max assist;with +2 assist;stand pivot transfer;bedside commode Pt/caregiver will Perform Home Exercise  Program: Increased strength;Both right and left upper extremity Additional ADL Goal #1: Pt will complete bed mobility with min assist and maintain sitting balance with min guard for 5 minutes as precursor to ADLs.  Plan Discharge plan remains appropriate    Co-evaluation    PT/OT/SLP Co-Evaluation/Treatment: Yes Reason for Co-Treatment: Complexity of the patient's impairments (multi-system involvement);For patient/therapist safety;To address functional/ADL transfers PT goals addressed during session: Mobility/safety with mobility;Balance;Strengthening/ROM OT goals addressed during session: ADL's and self-care      AM-PAC OT "6 Clicks" Daily Activity     Outcome Measure   Help from another person eating meals?: A Lot Help from another person taking care of personal grooming?: A Little Help from another person toileting, which includes using toliet, bedpan, or urinal?: Total Help from another person bathing (including washing, rinsing, drying)?: A Lot Help from another person to put on and taking off regular upper body clothing?: A Lot Help from another person to put on and taking off regular lower body clothing?: Total 6 Click Score: 11    End of Session Equipment Utilized During Treatment: Gait belt  OT Visit Diagnosis: Other abnormalities of gait and mobility (R26.89);Muscle weakness (generalized) (M62.81);Pain;Other symptoms and signs involving cognitive function;Other symptoms and signs involving the nervous system (R29.898) Pain - Right/Left:  (Both) Pain - part of body: Leg   Activity Tolerance Patient tolerated treatment well   Patient Left in bed;with call bell/phone within reach;with bed alarm set;with SCD's reapplied;Other (comment);with family/visitor present (bed in chair position)   Nurse Communication Mobility status        Time: 7877-6548 OT Time Calculation (min): 55 min  Charges: OT General Charges $OT Visit: 1 Visit OT Treatments $Therapeutic Activity:  23-37 mins  [place], OTR/L Acute Rehab Pager: [phone] Office: [place] 03/02/2021, 5:01 PM

## 2021-03-02 NOTE — Progress Notes (Signed)
Patient's new trach capped per Dr. Charlsie Quest, CCM order.  Patient tolerating well.  RN notified.

## 2021-03-02 NOTE — Procedures (Signed)
Tracheostomy Change Note  Patient Details:   Name: NANNIE STARZYK DOB: [birth year] MRN: [id number]    Airway Documentation:     Evaluation  O2 sats: stable throughout Complications: No apparent complications Patient did tolerate procedure well. Bilateral Breath Sounds: Clear, Diminished  Positive color change with the ETCO2 detector.  Changed from a Shiley Flex #6 cuffed trach to a Shiley Flex #4 cuffless.    Bayard Beaver 03/02/2021, 3:27 PM

## 2021-03-02 NOTE — Progress Notes (Signed)
Inpatient Rehab Admissions Coordinator:   I do not have insurance auth or a bed for this pt. On CIR today. Ac team will continue to follow for progress and submit to insurance when appropriate.   Clemens Catholic, [place], [place] Admissions Coordinator  [phone] ([place]) [phone] (office)

## 2021-03-02 NOTE — Progress Notes (Signed)
Physical Therapy Treatment Patient Details Name: Angela Morales MRN: [id number] DOB: 01-May-1946 Today's Date: 03/02/2021    History of Present Illness Pt is a 75 y.o. female admitted 02/05/21 with c/o progressive BUE/BLE numbness and weakness; diagnosed with Guillain-Barr syndrome. Pt also with afib with RVR. Intubated 8/14 due to Acute respiratory failure with hypoxia w/ aspiration PNA and bibasilar atx.Marland Kitchen MRI negative 8/15. Trach placed 8/16. CT brain 8/16 negative.  PMH includes afib (recent ablation 01/28/21), celiac disease, DDD, gluacoma, L TKA (2017).    PT Comments    The pt continues to make slow but steady progress with mobility and functional strength at this time. She was able to assist more with movement of all extremities to complete bed mobility, and attempted sit-stand x2 from EOB with HHA of 2. The pt continues to demo significant deficits in proprioception, sensation, strength, and power which impact her ability to complete upright mobility in sitting or standing without assist to maintain upright. The pt benefited from sit-stand trial with therapists assisting to prevent knee buckling and hyperextension, and was able to maintain for up to 5-8 seconds prior to needing to return to sitting. Continue to recommend CIR level therapies at d/c.    Follow Up Recommendations  CIR     Equipment Recommendations  Rolling walker with 5" wheels;Wheelchair (measurements PT);Wheelchair cushion (measurements PT);Hospital bed;Other (comment)    Recommendations for Other Services       Precautions / Restrictions Precautions Precautions: Fall Precaution Comments: trach, cortrak Restrictions Weight Bearing Restrictions: No    Mobility  Bed Mobility Overal bed mobility: Needs Assistance Bed Mobility: Rolling;Sidelying to Sit;Sit to Sidelying Rolling: Min assist Sidelying to sit: Mod assist;+2 for safety/equipment     Sit to sidelying: Max assist;+2 for physical assistance;+2 for  safety/equipment General bed mobility comments: minA with increased time to complete rolling and reaching, cues for initiation of movement with LE and UE to complete rolling, once in position, pt able to maintain with bed rail. mod-maxA to complete sidelying-sit as pt needing increased time and cues to complete movement with LE as well as assist and sequential cues to elevate and lower trunk to bed    Transfers Overall transfer level: Needs assistance Equipment used: 2 person hand held assist Transfers: Sit to/from Stand Sit to Stand: Max assist;+2 physical assistance (+3 for posterior support)         General transfer comment: completed x2, once with bilateral knee block, assist at hips via bed pad, and pt holding therapists arms. pt with noted bilateral knee hyperexxtension. second attempt with therapists seated to pt's side to block both knee buckling and hyperextension, assist of 3rd person to generate hip lift, pt then able to bear wt through BUE  to maintain upright  Ambulation/Gait             General Gait Details: Unable to assess       Balance Overall balance assessment: Needs assistance Sitting-balance support: Bilateral upper extremity supported;Feet supported;No upper extremity supported Sitting balance-Leahy Scale: Poor Sitting balance - Comments: min guard at best statically with up to maxA posterior support for pt to rest   Standing balance support: Bilateral upper extremity supported;During functional activity Standing balance-Leahy Scale: Zero Standing balance comment: maxA from therapists and BUE support                            Cognition Arousal/Alertness: Awake/alert Behavior During Therapy: [facility] for tasks assessed/performed Overall Cognitive  Status: Within Functional Limits for tasks assessed                                 General Comments: pt following commands well, cues at times for motivation as pt fatigues       Exercises Other Exercises Other Exercises: ankle DF with eversion x10 each leg    General Comments General comments (skin integrity, edema, etc.): VSS on RA, pt with BM with mobility.      Pertinent Vitals/Pain Pain Assessment: Faces Faces Pain Scale: Hurts little more Pain Location: groin, back Pain Descriptors / Indicators: Guarding;Grimacing;Pins and needles Pain Intervention(s): Limited activity within patient's tolerance;Monitored during session;Repositioned     PT Goals (current goals can now be found in the care plan section) Acute Rehab PT Goals Patient Stated Goal: get better. PT Goal Formulation: With patient/family Time For Goal Achievement: 03/07/21 Potential to Achieve Goals: Good Progress towards PT goals: Progressing toward goals    Frequency    Min 4X/week      PT Plan Current plan remains appropriate    Co-evaluation PT/OT/SLP Co-Evaluation/Treatment: Yes Reason for Co-Treatment: Complexity of the patient's impairments (multi-system involvement);For patient/therapist safety;To address functional/ADL transfers PT goals addressed during session: Mobility/safety with mobility;Balance;Strengthening/ROM        AM-PAC PT "6 Clicks" Mobility   Outcome Measure  Help needed turning from your back to your side while in a flat bed without using bedrails?: A Little Help needed moving from lying on your back to sitting on the side of a flat bed without using bedrails?: A Lot Help needed moving to and from a bed to a chair (including a wheelchair)?: Total Help needed standing up from a chair using your arms (e.g., wheelchair or bedside chair)?: Total Help needed to walk in hospital room?: Total Help needed climbing 3-5 steps with a railing? : Total 6 Click Score: 9    End of Session Equipment Utilized During Treatment: Gait belt Activity Tolerance: Patient tolerated treatment well Patient left: with call bell/phone within reach;in bed;with bed alarm  set;with family/visitor present Nurse Communication: Mobility status PT Visit Diagnosis: Other abnormalities of gait and mobility (R26.89);Muscle weakness (generalized) (M62.81);Other symptoms and signs involving the nervous system (R29.898) Hemiplegia - caused by: Unspecified     Time: 1530-1610 PT Time Calculation (min) (ACUTE ONLY): 40 min  Charges:  $Therapeutic Exercise: 8-22 mins $Therapeutic Activity: 8-22 mins                     West Carbo, PT, DPT   Acute Rehabilitation Department Pager #: [phone]   Sandra Cockayne 03/02/2021, 4:24 PM

## 2021-03-02 NOTE — Progress Notes (Signed)
Sleeping. No changes to management, will touch base with RN to see if any issues. No charge.

## 2021-03-02 NOTE — Progress Notes (Signed)
  Speech Language Pathology Treatment: Dysphagia;Passy Muir Speaking valve  Patient Details Name: Angela Morales MRN: [id number] DOB: 11-06-1945 Today's Date: 03/02/2021 Time: 0855-0920 SLP Time Calculation (min) (ACUTE ONLY): 25 min  Assessment / Plan / Recommendation Clinical Impression  SLP followed up for diet tolerance (pt initiated diet 9/2 following MBSS), no reports of overt difficulty with PO consumption per pt or RN. Pts overall appetite has been reduced, pt still getting continuous tube feedings via cortrak. Coordinated with MD/RD for consideration of weaning from alternative nutrition to assist appetite and PO intake. Pt assessed with regular and thin liquid breakfast meal. No overt s/sx of aspiration noted with any PO. Mastication was adequate and vocal quality remained clear. SLP reinforced PMSV precautions with pt including removing for sleeping at night. PMSV in place by RN when SLP arrived. Pt intelligibility of speech was intact. Per MD pt with plans for trach downsize and transition to cuffless trach. Will initiate respiratory muscle strength training to assist for candidacy for decannulation next session. SLP to continue to follow.    HPI HPI: 75 y.o. female admitted 02/05/21 with c/o progressive BUE/BLE numbness and weakness; diagnosed with Guillain-Barr syndrome. Pt also with afib with RVR. Intubated 8/14 due to Acute respiratory failure with hypoxia w/ aspiration PNA and bibasilar atx.Marland Kitchen MRI negative 8/15. Trach placed 8/16. CT brain 8/16 negative.  PMH includes afib (recent ablation 01/28/21), celiac disease, DDD, gluacoma.      SLP Plan  Continue with current plan of care       Recommendations  Diet recommendations: Regular;Thin liquid Medication Administration: Crushed with puree      Patient may use Passy-Muir Speech Valve: During all waking hours (remove during sleep) PMSV Supervision: Intermittent MD: Please consider changing trach tube to : Smaller size;Cuffless          Oral Care Recommendations: Oral care BID Follow up Recommendations: Inpatient Rehab SLP Visit Diagnosis: Aphonia (R49.1);Dysphagia, unspecified (R13.10) Plan: Continue with current plan of care       [place], CCC-SLP Acute Rehabilitation Services   03/02/2021, 9:31 AM

## 2021-03-03 DIAGNOSIS — G610 Guillain-Barre syndrome: Secondary | ICD-10-CM | POA: Diagnosis not present

## 2021-03-03 LAB — GLUCOSE, CAPILLARY
Glucose-Capillary: 110 mg/dL — ABNORMAL HIGH (ref 70–99)
Glucose-Capillary: 117 mg/dL — ABNORMAL HIGH (ref 70–99)
Glucose-Capillary: 107 mg/dL — ABNORMAL HIGH (ref 70–99)
Glucose-Capillary: 119 mg/dL — ABNORMAL HIGH (ref 70–99)
Glucose-Capillary: 128 mg/dL — ABNORMAL HIGH (ref 70–99)
Glucose-Capillary: 91 mg/dL (ref 70–99)

## 2021-03-03 LAB — BASIC METABOLIC PANEL
Anion gap: 7 (ref 5–15)
BUN: 15 mg/dL (ref 8–23)
CO2: 26 mmol/L (ref 22–32)
Calcium: 8.5 mg/dL — ABNORMAL LOW (ref 8.9–10.3)
Chloride: 97 mmol/L — ABNORMAL LOW (ref 98–111)
Creatinine, Ser: 0.41 mg/dL — ABNORMAL LOW (ref 0.44–1.00)
GFR, Estimated: >60 mL/min (ref >60)
Glucose, Bld: 125 mg/dL — ABNORMAL HIGH (ref 70–99)
Potassium: 4 mmol/L (ref 3.5–5.1)
Sodium: 130 mmol/L — ABNORMAL LOW (ref 135–145)

## 2021-03-03 MED ORDER — OXYCODONE HCL 5 MG PO TABS
2.5000 mg | ORAL_TABLET | ORAL | Status: DC | PRN
  Administered 2021-03-03: 2.5 mg via ORAL

## 2021-03-03 MED ORDER — CYCLOBENZAPRINE HCL 5 MG PO TABS
5.0000 mg | ORAL_TABLET | Freq: Two times a day (BID) | ORAL | Status: DC | PRN
  Administered 2021-03-03: 5 mg via ORAL
  Filled 2021-03-03 (×2): qty 1

## 2021-03-03 MED ORDER — OSMOLITE 1.5 CAL PO LIQD
720.0000 mL | ORAL | Status: DC
  Filled 2021-03-03 (×2): qty 948

## 2021-03-03 MED ORDER — SENNOSIDES-DOCUSATE SODIUM 8.6-50 MG PO TABS: 1.0000 | ORAL_TABLET | Freq: Every evening | ORAL | Status: DC | PRN

## 2021-03-03 MED ORDER — BOOST / RESOURCE BREEZE PO LIQD CUSTOM
1.0000 | Freq: Three times a day (TID) | ORAL | Status: DC
  Administered 2021-03-03 – 2021-03-04 (×2): 1 via ORAL

## 2021-03-03 NOTE — Progress Notes (Signed)
Stoma site assessed. Gauze is clean and tape is still secure. Pt is stable at this time on RA. RT will monitor.

## 2021-03-03 NOTE — Progress Notes (Addendum)
eLink Physician-Brief Progress Note Patient Name: Angela Morales DOB: [birth year] MRN: [id number]   Date of Service  03/03/2021  HPI/Events of Note  Bladder scan positive for > 800 ml of urine, and patient does not have the urge to void.  eICU Interventions  Foley catheter protocol ordered.        Kerry Kass [name] 03/03/2021, 10:35 PM

## 2021-03-03 NOTE — Progress Notes (Signed)
Patient ID: Angela Morales, female   DOB: 11-Dec-1945, 75 y.o.   MRN: [id number]   NAME:  ONEDA DUFFETT, MRN:  [id number], DOB:  22-Jun-1946, LOS: 68 ADMISSION DATE:  02/05/2021, CONSULTATION DATE:  02/07/21  REFERRING MD:  Dr Lupita Leash, CHIEF COMPLAINT:  tachypnea, hypoxemia   History of Present Illness:  75 year old woman with a history of atrial fibrillation (ablated 8/3), celiac disease, DDD who was admitted on 8/11 with bilateral upper and lower extremity weakness and paresthesias beginning about 8/8.  Developed some ascending weakness that resulted in inability to ambulate.  No other prodrome.  No clear precipitants although she was started on gabapentin about 1 month ago.  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  LP deferred initially as she was on Eliquis last dose 8/11.  IVIG started 8/12.  She has had progressive bilateral upper extremity weakness, some bulbar dysfunction difficulty swallowing, globus sensation with perception of swallowing and voice weakness.  FVC has been consistently greater than 1.0 L, NIF <-25.  She had an episode of desaturation, increased work of breathing and choking when eating Jell-O and pudding evening of 8/13.  Required intubation on 8/14.   Pertinent  Medical History   Past Medical History:  Diagnosis Date   Abnormal uterine bleeding    on HRT   Allergy    seasonal   Arthritis    Atrial fibrillation (HCC)    Celiac disease    DDD (degenerative disc disease)    Dysrhythmia    Glaucoma    Microscopic colitis    Osteoarthritis of both knees    PONV (postoperative nausea and vomiting)    SVT (supraventricular tachycardia) ([place])     Significant Hospital Events: Including procedures, antibiotic start and stop dates in addition to other pertinent events   IVIg started 8/12 CT CAP >> no dissection, bibasilar atelectasis, distended gallbladder with some pericholecystic stranding, segmental colitis question related to  diverticular disease RUQ ultrasound 8/12 >> distended gallbladder without stones or pericholecystic fluid no evidence cholecystitis MR cervical, thoracic, lumbar spine 8/12 > normal spinal cord no imaging evidence of demyelination or Guillain-Barr.  Advanced lumbar DJD, cervical DJD 8/13 Zosyn started for possible aspiration pneumonia 8/14: Intubated for progressive respiratory failure, poor bulbar function, and downtrending NIF and vital capacity sodium down to 115 from 124 but corrected to 119 with hyperglycemia.  115 was treated with hypertonic saline bolus.  IV sedating drips stopped. 8/15: IV sedating drips stopped, placed back on propofol in the evening hours.  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  Free water limited.  MRI neg.  8/16: day 5 of 5 IVIG.  Remains profoundly weak.  Tracheostomy,  Getting IV Lasix for volume overload.  Stanly neg. 8/17: Remains in ICU, completed 5 days of zosyn.  8/18: started round 2 of  IVIG per neuro 8/19 fentanyl/ klonopin stopped (very sensitive/ too somnolent) 8/22 IVIG completed, strength slowly improving, tolerating trach collar intermittently during the day 8/25 trach collar 2hr BID. Staph and klebsiella in trach aspirate 8/26 feels weaker. Starting cefazolin  8/27 atrial flutter overnight. Got fluid challenge  8/28 amiodarone infusion initiated 8/29 tolerating ATC during day. Hypotensive at night->got fluid challenge  8/30 amiodarone transition to via tube.  Lovenox changed back to apixaban 8/31: episode of hypotension during the night with SBP in 70s after given metoprolol and seroquel. Didn't respond to fluids. Placed on neo. During the day, Heart rhythm A flutter rate low 100s. Cardioverted into sinus rhythm.  Interim History / Subjective:  No events. Tolerated capping trial overnight.  Objective   Blood pressure (!) 142/84, pulse 85, temperature 98.3 F (36.8 C), temperature source Oral, resp. rate 19, height 5' 4"  (1.626 m), weight  64.3 kg, last menstrual period 04/20/2005, SpO2 100 %.    FiO2 (%):  [28 %] 28 %   Intake/Output Summary (Last 24 hours) at 03/03/2021 7673 Last data filed at 03/03/2021 0600 Gross per 24 hour  Intake 1740 ml  Output 1400 ml  Net 340 ml    Filed Weights   03/01/21 0600 03/02/21 0500 03/03/21 0500  Weight: 64.6 kg 64.6 kg 64.3 kg   No distress Cortrak in place MMM Lungs diminished bases, normal RR Heart sounds regular, ext warm Strong voice Moves all 4 ext to command but weaker on L Entire exam unchanged  Chemistries look good  Resolved Hospital Problem list   Circulatory shock. Pressor dependent. Did meet SIRS criteria so w/ PNA will call this a mix of septic shock and also medication related hypotension Acute encephalopathy  SIADH  S. Aureus pneumonia- RLL s/p cefazolin x 7 days Assessment & Plan:   Acute respiratory failure with hypoxia due to NM weakness from GBS- improving post IVIG; GBS idiopathic vs. 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

## 2021-03-03 NOTE — Progress Notes (Signed)
Occupational Therapy Treatment Patient Details Name: Angela Morales MRN: [id number] DOB: 1946/03/31 Today's Date: 03/03/2021    History of present illness Pt is a 75 y.o. female admitted 02/05/21 with c/o progressive BUE/BLE numbness and weakness; diagnosed with Guillain-Barr syndrome. Pt also with afib with RVR. Intubated 8/14 due to acute respiratory failure with hypoxia w/ aspiration PNA and bibasilar atelectasis. Brain MRI negative 8/15. Trach placed 8/16. Trach decannunlated 9/6. PMH includes afib (recent ablation 01/28/21), celiac disease, DDD, gluacoma, and L TKA (2017).   OT comments  Pt reports frustration with difficulty opening items on meal trays, focused on exercises in supine fine motor strength and coordination using various items in room (see below) and educated on repetitive tasks for strengthening and neuro reeducation.  Pt fatigues easily and requires encouragement for repetition, voicing at times "I think I'm good on that". Completing max assist +2 for squat pivot transfers into recliner, total assist for LB dressing. Continue to recommend CIR.    Follow Up Recommendations  CIR;Supervision/Assistance - 24 hour    Equipment Recommendations  Other (comment) (defer to next venue)    Recommendations for Other Services Rehab consult    Precautions / Restrictions Precautions Precautions: Fall;Other (comment) Precaution Comments: cortrak; bladder/bowel incontinence Required Braces or Orthoses: Other Brace Other Brace: bilateral ankle air splints Restrictions Weight Bearing Restrictions: No       Mobility Bed Mobility Overal bed mobility: Needs Assistance Bed Mobility: Rolling;Sidelying to Sit Rolling: Mod assist Sidelying to sit: Mod assist;+2 for safety/equipment       General bed mobility comments: Pt able to roll R/L multiple times for repositioning and pericare, assisting well with UE support on rails, requiring mod-maxA for BLE management and to complete  pelvic rotation; modA for trunk elevation and scooting hips to EOB to sit    Transfers Overall transfer level: Needs assistance Equipment used: 2 person hand held assist Transfers: Squat Pivot Transfers     Squat pivot transfers: Max assist;+2 physical assistance     General transfer comment: MaxA+2 for squat pivot transfer from bed to drop arm recliner, pt able to assist with bilateral HHA on therapists' arms, totalA for BLE positioning for transfer; pt declined standing trial from recliner secondary to fatigue    Balance Overall balance assessment: Needs assistance Sitting-balance support: Bilateral upper extremity supported;Feet supported;No upper extremity supported Sitting balance-Leahy Scale: Poor Sitting balance - Comments: Able to maintain static sitting EOB with BUE support and min guard     Standing balance-Leahy Scale: Zero                             ADL either performed or assessed with clinical judgement   ADL Overall ADL's : Needs assistance/impaired                     Lower Body Dressing: Total assistance;+2 for physical assistance;+2 for safety/equipment;Sitting/lateral leans;Bed level Lower Body Dressing Details (indicate cue type and reason): assist for socks and shoes Toilet Transfer: Maximal assistance;+2 for physical assistance;+2 for safety/equipment;Squat-pivot Toilet Transfer Details (indicate cue type and reason): simulated to recliner Toileting- Clothing Manipulation and Hygiene: Total assistance;+2 for physical assistance;+2 for safety/equipment;Bed level Toileting - Clothing Manipulation Details (indicate cue type and reason): rolling in bed, total assist     Functional mobility during ADLs: Maximal assistance;+2 for physical assistance       Vision       Perception     Praxis  Cognition Arousal/Alertness: Awake/alert Behavior During Therapy: WFL for tasks assessed/performed Overall Cognitive Status: Within  Functional Limits for tasks assessed                                          Exercises Exercises: Other exercises General Exercises - Lower Extremity Short Arc Quad: AROM;Both;Supine Other Exercises Other Exercises: bilateral gastroc stretch (pt reports feeling pressure, but does not feel stretch) Other Exercises: completing fine and gross motor exercises including: opening containers (toothpaste, lotion), "wringing out washclothes", x 5 pinching tissues and reaching to hand therapist in various planes each UE, tearing tissues and tape, x 5 pinch on squeeze balls   Shoulder Instructions       General Comments Pt aware bed pad was wet, pt with bowel incontinence and dependent for pericare. Pt reports bladder feeling "full... like I need to pee" but unable to void; RN notified, to do bladder scan. SpO2 97% on RA, HR 96, BP 140/77. bilateral air casts and tennis shoes donned during session    Pertinent Vitals/ Pain       Pain Assessment: Faces Faces Pain Scale: Hurts little more Pain Location: Lower back, bilateral thighs Pain Descriptors / Indicators: Guarding;Grimacing;Pins and needles;Discomfort Pain Intervention(s): Limited activity within patient's tolerance;Monitored during session;Repositioned  Home Living                                          Prior Functioning/Environment              Frequency  Min 2X/week        Progress Toward Goals  OT Goals(current goals can now be found in the care plan section)  Progress towards OT goals: Progressing toward goals  Acute Rehab OT Goals Patient Stated Goal: get better. OT Goal Formulation: With patient  Plan Discharge plan remains appropriate;Frequency remains appropriate    Co-evaluation    PT/OT/SLP Co-Evaluation/Treatment: Yes Reason for Co-Treatment: Complexity of the patient's impairments (multi-system involvement);To address functional/ADL transfers PT goals addressed during  session: Mobility/safety with mobility;Balance;Strengthening/ROM OT goals addressed during session: ADL's and self-care      AM-PAC OT "6 Clicks" Daily Activity     Outcome Measure   Help from another person eating meals?: A Little Help from another person taking care of personal grooming?: A Little Help from another person toileting, which includes using toliet, bedpan, or urinal?: Total Help from another person bathing (including washing, rinsing, drying)?: A Lot Help from another person to put on and taking off regular upper body clothing?: A Lot Help from another person to put on and taking off regular lower body clothing?: Total 6 Click Score: 12    End of Session    OT Visit Diagnosis: Other abnormalities of gait and mobility (R26.89);Muscle weakness (generalized) (M62.81);Pain;Other symptoms and signs involving cognitive function;Other symptoms and signs involving the nervous system (R29.898) Pain - Right/Left:  (bil) Pain - part of body: Leg   Activity Tolerance Patient tolerated treatment well   Patient Left with call bell/phone within reach;with family/visitor present;in chair;with nursing/sitter in room   Nurse Communication Mobility status        Time: 5974-1638 OT Time Calculation (min): 47 min  Charges: OT General Charges $OT Visit: 1 Visit OT Treatments $Self Care/Home Management : 8-22 mins  Adonis Brook  B, OT Acute Rehabilitation Services Pager [phone] Office [place] 03/03/2021, 12:53 PM

## 2021-03-03 NOTE — Progress Notes (Signed)
Inpatient Rehab Admissions Coordinator:   [facility] closed yesterday.  Will start insurance auth today.    Shann Medal, PT, DPT Admissions Coordinator [phone] 03/03/21  10:08 AM

## 2021-03-03 NOTE — Progress Notes (Signed)
Physical Therapy Treatment Patient Details Name: Angela Morales MRN: [id number] DOB: 03/21/1946 Today's Date: 03/03/2021    History of Present Illness Pt is a 75 y.o. female admitted 02/05/21 with c/o progressive BUE/BLE numbness and weakness; diagnosed with Guillain-Barr syndrome. Pt also with afib with RVR. Intubated 8/14 due to acute respiratory failure with hypoxia w/ aspiration PNA and bibasilar atelectasis. Brain MRI negative 8/15. Trach placed 8/16. Trach decannunlated 9/6. PMH includes afib (recent ablation 01/28/21), celiac disease, DDD, gluacoma, and L TKA (2017).   PT Comments    Pt progressing with mobility. Today's session focused on bed mobility, sitting balance and OOB transfers; pt requiring minA to maxA+2 for mobility, able to maintain static sitting EOB with BUE support and min guard. Pt remains limited by generalized weakness, decreased activity tolerance, and impaired balance strategies/postural reactions; pt limited by fatigue, but ultimately demonstrates improving activity tolerance this session; remains motivated to participate. Continue to recommend intensive CIR-level therapies to maximize functional mobility and independence.    Follow Up Recommendations  CIR     Equipment Recommendations  Rolling walker with 5" wheels;Wheelchair (measurements PT);Wheelchair cushion (measurements PT);Hospital bed;Lift equipment    Recommendations for Other Services       Precautions / Restrictions Precautions Precautions: Fall;Other (comment) Precaution Comments: cortrak; bladder/bowel incontinence Required Braces or Orthoses: Other Brace Other Brace: bilateral ankle air splints    Mobility  Bed Mobility Overal bed mobility: Needs Assistance Bed Mobility: Rolling;Sidelying to Sit Rolling: Mod assist Sidelying to sit: Mod assist;+2 for safety/equipment       General bed mobility comments: Pt able to roll R/L multiple times for repositioning and pericare, assisting well  with UE support on rails, requiring mod-maxA for BLE management and to complete pelvic rotation; modA for trunk elevation and scooting hips to EOB to sit    Transfers Overall transfer level: Needs assistance Equipment used: 2 person hand held assist Transfers: Squat Pivot Transfers     Squat pivot transfers: Max assist;+2 physical assistance     General transfer comment: MaxA+2 for squat pivot transfer from bed to drop arm recliner, pt able to assist with bilateral HHA on therapists' arms, totalA for BLE positioning for transfer; pt declined standing trial from recliner secondary to fatigue  Ambulation/Gait                 Stairs             Wheelchair Mobility    Modified Rankin (Stroke Patients Only)       Balance Overall balance assessment: Needs assistance Sitting-balance support: Bilateral upper extremity supported;Feet supported;No upper extremity supported Sitting balance-Leahy Scale: Poor Sitting balance - Comments: Able to maintain static sitting EOB with BUE support and min guard     Standing balance-Leahy Scale: Zero                              Cognition Arousal/Alertness: Awake/alert Behavior During Therapy: WFL for tasks assessed/performed Overall Cognitive Status: Within Functional Limits for tasks assessed                                        Exercises General Exercises - Lower Extremity Short Arc Quad: AROM;Both;Supine Other Exercises Other Exercises: bilateral gastroc stretch (pt reports feeling pressure, but does not feel stretch)    General Comments General comments (skin integrity, edema, etc.):  Pt aware bed pad was wet, pt with bowel incontinence and dependent for pericare. Pt reports bladder feeling "full... like I need to pee" but unable to void; RN notified, to do bladder scan. SpO2 97% on RA, HR 96, BP 140/77. bilateral air casts and tennis shoes donned during session      Pertinent Vitals/Pain  Pain Assessment: Faces Faces Pain Scale: Hurts little more Pain Location: Lower back, bilateral thighs Pain Descriptors / Indicators: Guarding;Grimacing;Pins and needles;Discomfort Pain Intervention(s): Monitored during session;Premedicated before session;Limited activity within patient's tolerance    Home Living                      Prior Function            PT Goals (current goals can now be found in the care plan section) Progress towards PT goals: Progressing toward goals    Frequency    Min 4X/week      PT Plan Current plan remains appropriate    Co-evaluation PT/OT/SLP Co-Evaluation/Treatment: Yes Reason for Co-Treatment: Complexity of the patient's impairments (multi-system involvement);For patient/therapist safety;To address functional/ADL transfers PT goals addressed during session: Mobility/safety with mobility;Balance;Strengthening/ROM        AM-PAC PT "6 Clicks" Mobility   Outcome Measure  Help needed turning from your back to your side while in a flat bed without using bedrails?: A Lot Help needed moving from lying on your back to sitting on the side of a flat bed without using bedrails?: A Lot Help needed moving to and from a bed to a chair (including a wheelchair)?: Total Help needed standing up from a chair using your arms (e.g., wheelchair or bedside chair)?: Total Help needed to walk in hospital room?: Total Help needed climbing 3-5 steps with a railing? : Total 6 Click Score: 8    End of Session Equipment Utilized During Treatment: Gait belt Activity Tolerance: Patient tolerated treatment well;Patient limited by fatigue Patient left: in chair;with call bell/phone within reach;with nursing/sitter in room Nurse Communication: Mobility status;Need for lift equipment PT Visit Diagnosis: Other abnormalities of gait and mobility (R26.89);Muscle weakness (generalized) (M62.81);Other symptoms and signs involving the nervous system (R29.898)      Time: 1194-1740 PT Time Calculation (min) (ACUTE ONLY): 47 min  Charges:  $Therapeutic Activity: 23-37 mins                     Mabeline Caras, PT, DPT Acute Rehabilitation Services  Pager [phone] Office [place] 03/03/2021, 12:30 PM

## 2021-03-03 NOTE — Progress Notes (Addendum)
Nutrition Follow-up  DOCUMENTATION CODES:   Non-severe (moderate) malnutrition in context of chronic illness  INTERVENTION:  Provide nocturnal tube feeds using Osmolite 1.5 cal formula via Cortrak at rate of 60 ml/hr x 12 hours (7pm-7am)  Continue 45 ml Prosource TF BID per tube.   Tube feeding regimen provides 1160 kcal (64% of kcal needs), 67 grams of protein (74% of protein needs), and 547 ml free water.   Provide Boost Breeze po TID, each supplement provides 250 kcal and 9 grams of protein.  Encourage adequate PO intake.   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 PMH includes Celiac Disease  8/12 Admitted, IVIG started 8/14 Intubated 8/16 Gastric cortrak placed, s/p trach    Trach decannulated today.   Labs and medications reviewed. Pt is currently on a gluten free diet with regular texture and thin liquids. Meal completion has been 50-90%. Cortrak in place with tube feeds infusing at half rate of 25 ml/hr x 24 hours. RD to modify tube feeds to transition to nocturnal tube feeds to encourage/promote po intake during the day. RD to additionally order nutritional supplements to aid in caloric and protein needs. Pt reports intolerance to milk shake type supplements. RD to order Boost Breeze instead. Pt encouraged to eat her food at meals and to drink her supplements. RN to inquire regarding family permission to bring in food from home/outside.    Diet Order:   Diet Order             Diet gluten free Room service appropriate? Yes; Fluid consistency: Thin  Diet effective now                   EDUCATION NEEDS:   Not appropriate for education at this time  Skin:  Skin Assessment: Reviewed RN Assessment  Last BM:  9/5  Height:   Ht Readings from Last 1 Encounters:  02/13/21 5' 4"  (1.626 m)    Weight:   Wt Readings from Last 1 Encounters:  03/03/21 64.3 kg   BMI:  Body mass index is 24.33 kg/m.  Estimated Nutritional Needs:   Kcal:  1800-2000  Protein:  90-105 grams  Fluid:  >/= 1.8 L/day  Corrin Parker, MS, RD, LDN RD pager number/after hours weekend pager number on Am[facility].

## 2021-03-03 NOTE — Procedures (Signed)
Tracheostomy Change Note  Patient Details:   Name: NAI BORROMEO DOB: 1945/10/27 MRN: [id number]    Airway Documentation:     Evaluation  O2 sats: stable throughout Complications: No apparent complications Patient did tolerate procedure well. Bilateral Breath Sounds: Clear, Diminished    Pt's trach was decannulated per Md order without complication. Stoma site was cleaned and dried. Gauze was taped over stoma site. Pt is stable at this time on RA. RT will monitor.   Ronaldo Miyamoto 03/03/2021, 9:28 AM

## 2021-03-03 NOTE — Progress Notes (Signed)
Patient is medically stable for transfer to CIR.  Erskine Emery MD PCCM

## 2021-03-04 ENCOUNTER — Inpatient Hospital Stay (HOSPITAL_COMMUNITY)
Admission: RE | Admit: 2021-03-04 | Discharge: 2021-03-13 | DRG: 095 | Disposition: A | Discharge status: Other Acute Inpatient Hospital | Payer: Medicare Other | Source: Intra-hospital | Attending: Physical Medicine and Rehabilitation | Admitting: Physical Medicine and Rehabilitation

## 2021-03-04 ENCOUNTER — Other Ambulatory Visit: Payer: Self-pay

## 2021-03-04 ENCOUNTER — Encounter (HOSPITAL_COMMUNITY): Payer: Self-pay | Admitting: Physical Medicine and Rehabilitation

## 2021-03-04 DIAGNOSIS — G894 Chronic pain syndrome: Secondary | ICD-10-CM | POA: Diagnosis not present

## 2021-03-04 DIAGNOSIS — Z881 Allergy status to other antibiotic agents status: Secondary | ICD-10-CM | POA: Diagnosis not present

## 2021-03-04 DIAGNOSIS — R339 Retention of urine, unspecified: Secondary | ICD-10-CM | POA: Diagnosis not present

## 2021-03-04 DIAGNOSIS — G610 Guillain-Barre syndrome: Principal | ICD-10-CM | POA: Diagnosis present

## 2021-03-04 DIAGNOSIS — I959 Hypotension, unspecified: Secondary | ICD-10-CM | POA: Diagnosis not present

## 2021-03-04 DIAGNOSIS — Z8042 Family history of malignant neoplasm of prostate: Secondary | ICD-10-CM

## 2021-03-04 DIAGNOSIS — I4892 Unspecified atrial flutter: Secondary | ICD-10-CM | POA: Diagnosis present

## 2021-03-04 DIAGNOSIS — G479 Sleep disorder, unspecified: Secondary | ICD-10-CM

## 2021-03-04 DIAGNOSIS — Z91018 Allergy to other foods: Secondary | ICD-10-CM | POA: Diagnosis not present

## 2021-03-04 DIAGNOSIS — Z803 Family history of malignant neoplasm of breast: Secondary | ICD-10-CM | POA: Diagnosis not present

## 2021-03-04 DIAGNOSIS — Z8249 Family history of ischemic heart disease and other diseases of the circulatory system: Secondary | ICD-10-CM

## 2021-03-04 DIAGNOSIS — M419 Scoliosis, unspecified: Secondary | ICD-10-CM | POA: Diagnosis present

## 2021-03-04 DIAGNOSIS — Z823 Family history of stroke: Secondary | ICD-10-CM | POA: Diagnosis not present

## 2021-03-04 DIAGNOSIS — M1711 Unilateral primary osteoarthritis, right knee: Secondary | ICD-10-CM | POA: Diagnosis present

## 2021-03-04 DIAGNOSIS — R739 Hyperglycemia, unspecified: Secondary | ICD-10-CM | POA: Diagnosis present

## 2021-03-04 DIAGNOSIS — Z8262 Family history of osteoporosis: Secondary | ICD-10-CM

## 2021-03-04 DIAGNOSIS — Z79899 Other long term (current) drug therapy: Secondary | ICD-10-CM | POA: Diagnosis not present

## 2021-03-04 DIAGNOSIS — I4891 Unspecified atrial fibrillation: Secondary | ICD-10-CM | POA: Diagnosis present

## 2021-03-04 DIAGNOSIS — E46 Unspecified protein-calorie malnutrition: Secondary | ICD-10-CM | POA: Diagnosis not present

## 2021-03-04 DIAGNOSIS — R1312 Dysphagia, oropharyngeal phase: Secondary | ICD-10-CM | POA: Diagnosis not present

## 2021-03-04 DIAGNOSIS — Z930 Tracheostomy status: Secondary | ICD-10-CM | POA: Diagnosis not present

## 2021-03-04 DIAGNOSIS — Z96652 Presence of left artificial knee joint: Secondary | ICD-10-CM | POA: Diagnosis present

## 2021-03-04 DIAGNOSIS — E871 Hypo-osmolality and hyponatremia: Secondary | ICD-10-CM | POA: Diagnosis not present

## 2021-03-04 DIAGNOSIS — N319 Neuromuscular dysfunction of bladder, unspecified: Secondary | ICD-10-CM | POA: Diagnosis not present

## 2021-03-04 DIAGNOSIS — R5381 Other malaise: Secondary | ICD-10-CM | POA: Diagnosis not present

## 2021-03-04 DIAGNOSIS — M549 Dorsalgia, unspecified: Secondary | ICD-10-CM | POA: Diagnosis not present

## 2021-03-04 DIAGNOSIS — Z7901 Long term (current) use of anticoagulants: Secondary | ICD-10-CM | POA: Diagnosis not present

## 2021-03-04 LAB — GLUCOSE, CAPILLARY
Glucose-Capillary: 131 mg/dL — ABNORMAL HIGH (ref 70–99)
Glucose-Capillary: 97 mg/dL (ref 70–99)
Glucose-Capillary: 120 mg/dL — ABNORMAL HIGH (ref 70–99)
Glucose-Capillary: 115 mg/dL — ABNORMAL HIGH (ref 70–99)
Glucose-Capillary: 119 mg/dL — ABNORMAL HIGH (ref 70–99)

## 2021-03-04 MED ORDER — BLISTEX MEDICATED EX OINT: 1.0000 "application " | TOPICAL_OINTMENT | CUTANEOUS | Status: DC | PRN

## 2021-03-04 MED ORDER — CHLORHEXIDINE GLUCONATE CLOTH 2 % EX PADS: 6.0000 | MEDICATED_PAD | CUTANEOUS | Status: DC

## 2021-03-04 MED ORDER — GERHARDT'S BUTT CREAM: 1.0000 "application " | TOPICAL_CREAM | CUTANEOUS | Status: DC | PRN

## 2021-03-04 MED ORDER — BETHANECHOL CHLORIDE 10 MG PO TABS
20.0000 mg | ORAL_TABLET | Freq: Three times a day (TID) | ORAL | Status: DC
  Administered 2021-03-04 – 2021-03-07 (×8): 20 mg via ORAL
  Filled 2021-03-04 (×8): qty 2

## 2021-03-04 MED ORDER — BISACODYL 10 MG RE SUPP
10.0000 mg | Freq: Every day | RECTAL | Status: DC | PRN
Start: 2021-03-04 — End: 2021-03-13

## 2021-03-04 MED ORDER — GUAIFENESIN 100 MG/5ML PO SOLN: 15.0000 mL | Freq: Four times a day (QID) | ORAL | 0 refills | Status: DC | PRN

## 2021-03-04 MED ORDER — AMIODARONE HCL 200 MG PO TABS
200.0000 mg | ORAL_TABLET | Freq: Every day | ORAL | Status: DC
  Administered 2021-03-05 – 2021-03-12 (×8): 200 mg via ORAL
  Filled 2021-03-04 (×8): qty 1

## 2021-03-04 MED ORDER — QUETIAPINE FUMARATE 25 MG PO TABS
25.0000 mg | ORAL_TABLET | Freq: Every day | ORAL | Status: DC
  Administered 2021-03-04 – 2021-03-10 (×7): 25 mg via ORAL
  Filled 2021-03-04 (×7): qty 1

## 2021-03-04 MED ORDER — CHLORHEXIDINE GLUCONATE 0.12 % MT SOLN
15.0000 mL | Freq: Two times a day (BID) | OROMUCOSAL | Status: DC
  Administered 2021-03-04 – 2021-03-12 (×12): 15 mL via OROMUCOSAL
  Filled 2021-03-04 (×16): qty 15

## 2021-03-04 MED ORDER — LATANOPROST 0.005 % OP SOLN
1.0000 [drp] | Freq: Every day | OPHTHALMIC | Status: DC
  Administered 2021-03-04 – 2021-03-12 (×9): 1 [drp] via OPHTHALMIC
  Filled 2021-03-04: qty 2.5

## 2021-03-04 MED ORDER — AMIODARONE HCL 200 MG PO TABS: 200.0000 mg | ORAL_TABLET | Freq: Every day | ORAL | Status: DC

## 2021-03-04 MED ORDER — TRAZODONE HCL 50 MG PO TABS: 25.0000 mg | ORAL_TABLET | Freq: Every evening | ORAL | Status: DC | PRN

## 2021-03-04 MED ORDER — PROCHLORPERAZINE MALEATE 5 MG PO TABS: 5.0000 mg | ORAL_TABLET | Freq: Four times a day (QID) | ORAL | Status: DC | PRN

## 2021-03-04 MED ORDER — QUETIAPINE FUMARATE 25 MG PO TABS: 25.0000 mg | ORAL_TABLET | Freq: Every day | ORAL | Status: DC

## 2021-03-04 MED ORDER — LATANOPROST 0.005 % OP SOLN: 1.0000 [drp] | Freq: Every day | OPHTHALMIC | 12 refills | Status: DC

## 2021-03-04 MED ORDER — POLYETHYLENE GLYCOL 3350 17 G PO PACK
17.0000 g | PACK | Freq: Every day | ORAL | Status: DC | PRN
  Administered 2021-03-09: 17 g via ORAL

## 2021-03-04 MED ORDER — INSULIN ASPART 100 UNIT/ML IJ SOLN: 0.0000 [IU] | Freq: Three times a day (TID) | INTRAMUSCULAR | Status: DC

## 2021-03-04 MED ORDER — PROSOURCE TF PO LIQD
45.0000 mL | Freq: Two times a day (BID) | ORAL | Status: DC
  Administered 2021-03-04 – 2021-03-09 (×9): 45 mL
  Filled 2021-03-04 (×10): qty 45

## 2021-03-04 MED ORDER — OXYCODONE HCL 5 MG PO TABS: 2.5000 mg | ORAL_TABLET | ORAL | 0 refills | Status: DC | PRN

## 2021-03-04 MED ORDER — POTASSIUM CHLORIDE 20 MEQ PO PACK
60.0000 meq | PACK | Freq: Every day | ORAL | Status: DC
  Administered 2021-03-05 – 2021-03-08 (×4): 60 meq via ORAL
  Filled 2021-03-04 (×4): qty 3

## 2021-03-04 MED ORDER — FLEET ENEMA 7-19 GM/118ML RE ENEM: 1.0000 | ENEMA | Freq: Once | RECTAL | Status: DC | PRN

## 2021-03-04 MED ORDER — TIMOLOL MALEATE 0.5 % OP SOLN
1.0000 [drp] | Freq: Two times a day (BID) | OPHTHALMIC | Status: DC
  Administered 2021-03-04 – 2021-03-12 (×17): 1 [drp] via OPHTHALMIC
  Filled 2021-03-04: qty 5

## 2021-03-04 MED ORDER — OSMOLITE 1.5 CAL PO LIQD: 720.0000 mL | ORAL | 0 refills | Status: DC

## 2021-03-04 MED ORDER — PROCHLORPERAZINE EDISYLATE 10 MG/2ML IJ SOLN: 5.0000 mg | Freq: Four times a day (QID) | INTRAMUSCULAR | Status: DC | PRN

## 2021-03-04 MED ORDER — OXYCODONE HCL 5 MG PO TABS
2.5000 mg | ORAL_TABLET | ORAL | Status: DC | PRN
  Administered 2021-03-05 – 2021-03-12 (×17): 2.5 mg via ORAL
  Filled 2021-03-04 (×18): qty 1

## 2021-03-04 MED ORDER — POLYETHYLENE GLYCOL 3350 17 G PO PACK
17.0000 g | PACK | Freq: Every day | ORAL | Status: DC
  Administered 2021-03-05 – 2021-03-12 (×8): 17 g via ORAL
  Filled 2021-03-04 (×8): qty 1

## 2021-03-04 MED ORDER — PROCHLORPERAZINE 25 MG RE SUPP: 12.5000 mg | Freq: Four times a day (QID) | RECTAL | Status: DC | PRN

## 2021-03-04 MED ORDER — ORAL CARE MOUTH RINSE
15.0000 mL | Freq: Two times a day (BID) | OROMUCOSAL | Status: DC
  Administered 2021-03-05 – 2021-03-12 (×12): 15 mL via OROMUCOSAL

## 2021-03-04 MED ORDER — POLYVINYL ALCOHOL 1.4 % OP SOLN: 1.0000 [drp] | OPHTHALMIC | 0 refills | Status: DC | PRN

## 2021-03-04 MED ORDER — ALBUTEROL SULFATE (2.5 MG/3ML) 0.083% IN NEBU: 2.5000 mg | INHALATION_SOLUTION | RESPIRATORY_TRACT | Status: DC | PRN

## 2021-03-04 MED ORDER — APIXABAN 5 MG PO TABS
5.0000 mg | ORAL_TABLET | Freq: Two times a day (BID) | ORAL | Status: DC
  Administered 2021-03-04 – 2021-03-12 (×17): 5 mg via ORAL
  Filled 2021-03-04 (×17): qty 1

## 2021-03-04 MED ORDER — DOCUSATE SODIUM 50 MG/5ML PO LIQD
100.0000 mg | Freq: Two times a day (BID) | ORAL | Status: DC
  Administered 2021-03-04 – 2021-03-07 (×5): 100 mg via ORAL
  Filled 2021-03-04 (×8): qty 10

## 2021-03-04 MED ORDER — MELATONIN 3 MG PO TABS: 3.0000 mg | ORAL_TABLET | Freq: Every day | ORAL | 0 refills | Status: DC

## 2021-03-04 MED ORDER — POLYETHYLENE GLYCOL 3350 17 G PO PACK: 17.0000 g | PACK | Freq: Every day | ORAL | 0 refills | Status: DC

## 2021-03-04 MED ORDER — BETHANECHOL CHLORIDE 10 MG PO TABS
20.0000 mg | ORAL_TABLET | Freq: Three times a day (TID) | ORAL | Status: DC
  Administered 2021-03-04 (×2): 20 mg via ORAL
  Filled 2021-03-04 (×2): qty 2

## 2021-03-04 MED ORDER — POTASSIUM CHLORIDE 20 MEQ PO PACK: 60.0000 meq | PACK | Freq: Every day | ORAL | Status: DC

## 2021-03-04 MED ORDER — MELATONIN 3 MG PO TABS
3.0000 mg | ORAL_TABLET | Freq: Every day | ORAL | Status: DC
  Administered 2021-03-04 – 2021-03-12 (×9): 3 mg via ORAL
  Filled 2021-03-04 (×9): qty 1

## 2021-03-04 MED ORDER — GUAIFENESIN-DM 100-10 MG/5ML PO SYRP: 5.0000 mL | ORAL_SOLUTION | Freq: Four times a day (QID) | ORAL | Status: DC | PRN

## 2021-03-04 MED ORDER — DOCUSATE SODIUM 50 MG/5ML PO LIQD: 100.0000 mg | Freq: Two times a day (BID) | ORAL | 0 refills | Status: DC

## 2021-03-04 MED ORDER — ALUM & MAG HYDROXIDE-SIMETH 200-200-20 MG/5ML PO SUSP: 30.0000 mL | ORAL | Status: DC | PRN

## 2021-03-04 MED ORDER — BOOST / RESOURCE BREEZE PO LIQD CUSTOM
1.0000 | Freq: Three times a day (TID) | ORAL | Status: DC
  Administered 2021-03-05 – 2021-03-06 (×4): 1 via ORAL

## 2021-03-04 MED ORDER — GERHARDT'S BUTT CREAM
1.0000 "application " | TOPICAL_CREAM | CUTANEOUS | Status: DC | PRN
  Filled 2021-03-04: qty 1

## 2021-03-04 MED ORDER — ACETAMINOPHEN 325 MG PO TABS: 325.0000 mg | ORAL_TABLET | ORAL | Status: DC | PRN

## 2021-03-04 MED ORDER — GUAIFENESIN 100 MG/5ML PO SOLN
15.0000 mL | Freq: Four times a day (QID) | ORAL | Status: DC
  Administered 2021-03-04 – 2021-03-08 (×3): 300 mg via ORAL
  Filled 2021-03-04 (×24): qty 15

## 2021-03-04 MED ORDER — PANTOPRAZOLE SODIUM 40 MG PO TBEC: 40.0000 mg | DELAYED_RELEASE_TABLET | Freq: Every day | ORAL | Status: DC

## 2021-03-04 MED ORDER — CYCLOBENZAPRINE HCL 5 MG PO TABS
5.0000 mg | ORAL_TABLET | Freq: Two times a day (BID) | ORAL | Status: DC | PRN
  Administered 2021-03-05 – 2021-03-11 (×8): 5 mg via ORAL
  Filled 2021-03-04 (×8): qty 1

## 2021-03-04 MED ORDER — GABAPENTIN 250 MG/5ML PO SOLN
200.0000 mg | Freq: Every day | ORAL | Status: DC
  Administered 2021-03-04 – 2021-03-07 (×4): 200 mg via ORAL
  Filled 2021-03-04 (×5): qty 4

## 2021-03-04 MED ORDER — POLYVINYL ALCOHOL 1.4 % OP SOLN
1.0000 [drp] | OPHTHALMIC | Status: DC | PRN
  Filled 2021-03-04: qty 15

## 2021-03-04 MED ORDER — DIPHENHYDRAMINE HCL 12.5 MG/5ML PO ELIX: 12.5000 mg | ORAL_SOLUTION | Freq: Four times a day (QID) | ORAL | Status: DC | PRN

## 2021-03-04 MED ORDER — LIDOCAINE 5 % EX PTCH
1.0000 | MEDICATED_PATCH | CUTANEOUS | Status: DC
  Administered 2021-03-05 – 2021-03-10 (×6): 1 via TRANSDERMAL
  Filled 2021-03-04 (×8): qty 1

## 2021-03-04 MED ORDER — SENNOSIDES-DOCUSATE SODIUM 8.6-50 MG PO TABS: 1.0000 | ORAL_TABLET | Freq: Every evening | ORAL | Status: DC | PRN

## 2021-03-04 MED ORDER — ALBUTEROL SULFATE (2.5 MG/3ML) 0.083% IN NEBU: 2.5000 mg | INHALATION_SOLUTION | RESPIRATORY_TRACT | 12 refills | Status: DC | PRN

## 2021-03-04 MED ORDER — PROSOURCE TF PO LIQD: 45.0000 mL | Freq: Two times a day (BID) | ORAL | Status: DC

## 2021-03-04 MED ORDER — LIP MEDEX EX OINT
1.0000 "application " | TOPICAL_OINTMENT | CUTANEOUS | Status: DC | PRN
  Filled 2021-03-04: qty 7

## 2021-03-04 MED ORDER — INSULIN ASPART 100 UNIT/ML IJ SOLN: 0.0000 [IU] | Freq: Every day | INTRAMUSCULAR | Status: DC

## 2021-03-04 MED ORDER — LORAZEPAM 0.5 MG PO TABS: 0.5000 mg | ORAL_TABLET | Freq: Four times a day (QID) | ORAL | Status: DC | PRN

## 2021-03-04 MED ORDER — CYCLOBENZAPRINE HCL 5 MG PO TABS: 5.0000 mg | ORAL_TABLET | Freq: Two times a day (BID) | ORAL | 0 refills | Status: DC | PRN

## 2021-03-04 MED ORDER — BETHANECHOL CHLORIDE 10 MG PO TABS: 20.0000 mg | ORAL_TABLET | Freq: Three times a day (TID) | ORAL | Status: DC

## 2021-03-04 MED ORDER — ACETAMINOPHEN 160 MG/5ML PO SOLN: 650.0000 mg | Freq: Four times a day (QID) | ORAL | 0 refills | Status: DC | PRN

## 2021-03-04 MED ORDER — PANTOPRAZOLE SODIUM 40 MG PO TBEC
40.0000 mg | DELAYED_RELEASE_TABLET | Freq: Every day | ORAL | Status: DC
  Administered 2021-03-05 – 2021-03-06 (×2): 40 mg via ORAL
  Filled 2021-03-04 (×3): qty 1

## 2021-03-04 MED ORDER — GABAPENTIN 250 MG/5ML PO SOLN: 200.0000 mg | Freq: Every day | ORAL | 12 refills | Status: DC

## 2021-03-04 MED ORDER — CHLORHEXIDINE GLUCONATE 0.12 % MT SOLN: 15.0000 mL | Freq: Two times a day (BID) | OROMUCOSAL | 0 refills | Status: DC

## 2021-03-04 NOTE — Progress Notes (Signed)
Inpatient Rehab Admissions Coordinator:    I have insurance approval and a bed available for pt to admit to CIR today. Dr. Tamala Julian in agreement.  Will let pt/family and TOC team know.   Shann Medal, PT, DPT Admissions Coordinator [phone] 03/04/21  9:58 AM

## 2021-03-04 NOTE — Progress Notes (Signed)
Patient ID: Angela Morales, female   DOB: 03/25/1946, 75 y.o.   MRN: [id number]   NAME:  Angela Morales, MRN:  [id number], DOB:  28-Jun-1946, LOS: 59 ADMISSION DATE:  02/05/2021, CONSULTATION DATE:  02/07/21  REFERRING MD:  Dr Lupita Leash, CHIEF COMPLAINT:  tachypnea, hypoxemia   History of Present Illness:  75 year old woman with a history of atrial fibrillation (ablated 8/3), celiac disease, DDD who was admitted on 8/11 with bilateral upper and lower extremity weakness and paresthesias beginning about 8/8.  Developed some ascending weakness that resulted in inability to ambulate.  No other prodrome.  No clear precipitants although she was started on gabapentin about 1 month ago.  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  LP deferred initially as she was on Eliquis last dose 8/11.  IVIG started 8/12.  She has had progressive bilateral upper extremity weakness, some bulbar dysfunction difficulty swallowing, globus sensation with perception of swallowing and voice weakness.  FVC has been consistently greater than 1.0 L, NIF <-25.  She had an episode of desaturation, increased work of breathing and choking when eating Jell-O and pudding evening of 8/13.  Required intubation on 8/14.   Pertinent  Medical History   Past Medical History:  Diagnosis Date   Abnormal uterine bleeding    on HRT   Allergy    seasonal   Arthritis    Atrial fibrillation (HCC)    Celiac disease    DDD (degenerative disc disease)    Dysrhythmia    Glaucoma    Microscopic colitis    Osteoarthritis of both knees    PONV (postoperative nausea and vomiting)    SVT (supraventricular tachycardia) ([place])     Significant Hospital Events: Including procedures, antibiotic start and stop dates in addition to other pertinent events   IVIg started 8/12 CT CAP >> no dissection, bibasilar atelectasis, distended gallbladder with some pericholecystic stranding, segmental colitis question related to  diverticular disease RUQ ultrasound 8/12 >> distended gallbladder without stones or pericholecystic fluid no evidence cholecystitis MR cervical, thoracic, lumbar spine 8/12 > normal spinal cord no imaging evidence of demyelination or Guillain-Barr.  Advanced lumbar DJD, cervical DJD 8/13 Zosyn started for possible aspiration pneumonia 8/14: Intubated for progressive respiratory failure, poor bulbar function, and downtrending NIF and vital capacity sodium down to 115 from 124 but corrected to 119 with hyperglycemia.  115 was treated with hypertonic saline bolus.  IV sedating drips stopped. 8/15: IV sedating drips stopped, placed back on propofol in the evening hours.  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  Free water limited.  MRI neg.  8/16: day 5 of 5 IVIG.  Remains profoundly weak.  Tracheostomy,  Getting IV Lasix for volume overload.  Arnot neg. 8/17: Remains in ICU, completed 5 days of zosyn.  8/18: started round 2 of  IVIG per neuro 8/19 fentanyl/ klonopin stopped (very sensitive/ too somnolent) 8/22 IVIG completed, strength slowly improving, tolerating trach collar intermittently during the day 8/25 trach collar 2hr BID. Staph and klebsiella in trach aspirate 8/26 feels weaker. Starting cefazolin  8/27 atrial flutter overnight. Got fluid challenge  8/28 amiodarone infusion initiated 8/29 tolerating ATC during day. Hypotensive at night->got fluid challenge  8/30 amiodarone transition to via tube.  Lovenox changed back to apixaban 8/31: episode of hypotension during the night with SBP in 70s after given metoprolol and seroquel. Didn't respond to fluids. Placed on neo. During the day, Heart rhythm A flutter rate low 100s. Cardioverted into sinus rhythm.  Interim History / Subjective:  No events. Decannulated 9/6  Objective   Blood pressure 113/64, pulse 91, temperature 97.7 F (36.5 C), temperature source Oral, resp. rate 20, height 5' 4"  (1.626 m), weight 61.6 kg, last  menstrual period 04/20/2005, SpO2 98 %.        Intake/Output Summary (Last 24 hours) at 03/04/2021 0809 Last data filed at 03/04/2021 0600 Gross per 24 hour  Intake 790 ml  Output 2100 ml  Net -1310 ml    Filed Weights   03/02/21 0500 03/03/21 0500 03/04/21 0500  Weight: 64.6 kg 64.3 kg 61.6 kg   No distress Cortrak in place MMM, trach stoma dressed without strikethrough Lungs diminished bases, normal RR Heart sounds regular, ext warm Strong voice Moves all 4 ext to command Entire exam unchanged  Chemistries look good  Resolved Hospital Problem list   Circulatory shock. Pressor dependent. Did meet SIRS criteria so w/ PNA will call this a mix of septic shock and also medication related hypotension Acute encephalopathy  SIADH  S. Aureus pneumonia- RLL s/p cefazolin x 7 days  Assessment & Plan:   Acute respiratory failure with hypoxia due to NM weakness from GBS- slowly improving post IVIG; GBS idiopathic vs. 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

## 2021-03-04 NOTE — H&P (Signed)
Physical Medicine and Rehabilitation Admission H&P    Chief Complaint  Patient presents with   Functional deficits due to GBS    HPI: Angela Morales is a 75 year old LH-female with history of A fib s/p recent ablation, severe lumbar scoliosis (not amenable to surgery), celiac disease, SVT who was admitted on 02/06/21 with 4-day history of tingling in hand and feet progressing to diffuse weakness with numbness (R) > (L) and inabilty to walk.  History taken from chart review, patient, and family.  Neurology consulted and she was started on IVIG for AIDP and eliquis transitioned to heparin due to plans for LP. She continued to worsen with increase WOB, concerns of aspiration event with supper and bouts of confusion with agitation felt to be due to hypoxic hypercarbic respiratory failure. She was intubated on 02/08/2021 and treated with course of Zosyn.  She has progressive hyponatremia, felt to be expected in setting of GBS and drop to 115 this was treated with hypertonic saline. She had issues with chest discomfort due to atrial fibrillation with RVR requiring IV diltiazem as well as amiodarone infusion.   Dr. Ellis Savage consulted for input on acute dysphagia and felt that symptoms due to GBS-->tube feeds recommended for nutritional support. CTA chest negative for PE and showed GB distension with faint pericholecystic stranding --> question early or developing acute cholecystitis and large air filled duodenal diverticulum without focal infection and segmental thickening of sigmoid colon question prior inflammation or chronic smoldering inflammation.  Follow up GB ultrasound showed distended GB without sones, pericholecystic fluid and no biliary dilatation. She underwent trach placement on 02/10/2021 by Dr. Tamala Julian and had brief episode of unresponsiveness. CT head unremarkable for acute intracranial process. She briefly needed Neo-synephrine due to hypotension secondary to BB and Seroquel.  She  developed leukocytosis with malaise on 02/19/2021 and was treated with 5 day course of Cefazolin for BAL positive for Staph/Kleb.     MBS done without evidence of dysphagia and was started on regular diet with tube feeds ongoing. She was slowly weaned to ATC and decannulatted by 09/06. She continues to be limited by diffuse weakness, balance deficits, fatigue and working on balance at EOB. CIR recommended due to functional decline.  Please see preadmission assessment from earlier today as well.  Review of Systems  Constitutional:  Negative for chills and fever.  HENT:  Negative for hearing loss.   Eyes:  Negative for blurred vision.       Right lateral visual field loss due to glaucoma  Respiratory:  Negative for cough and shortness of breath.   Cardiovascular:  Negative for chest pain and leg swelling.  Gastrointestinal:  Positive for constipation. Negative for heartburn and nausea.  Genitourinary:  Negative for dysuria.       Has pressure but unable to urinate  Musculoskeletal:  Positive for back pain (chronic). Negative for myalgias.  Skin:  Negative for rash.  Neurological:  Positive for tingling, sensory change, speech change, focal weakness and weakness.  Psychiatric/Behavioral:  The patient has insomnia. The patient is not nervous/anxious.   All other systems reviewed and are negative.   Past Medical History:  Diagnosis Date   Abnormal uterine bleeding    on HRT   Allergy    seasonal   Arthritis    Atrial fibrillation (HCC)    Celiac disease    DDD (degenerative disc disease)    Dysrhythmia    Glaucoma    Microscopic colitis    Osteoarthritis  of both knees    PONV (postoperative nausea and vomiting)    SVT (supraventricular tachycardia) (HCC)     Past Surgical History:  Procedure Laterality Date   APPENDECTOMY     ATRIAL FIBRILLATION ABLATION N/A 01/28/2021   Procedure: ATRIAL FIBRILLATION ABLATION;  Surgeon: Constance Haw, MD;  Location: [facility];   Service: Cardiovascular;  Laterality: N/A;   CARDIOVERSION N/A 10/30/2020   Procedure: CARDIOVERSION;  Surgeon: Jerline Pain, MD;  Location: [facility];  Service: Cardiovascular;  Laterality: N/A;   COLONOSCOPY     FOOT FRACTURE SURGERY  10/2013   TONSILLECTOMY AND ADENOIDECTOMY     TOTAL KNEE ARTHROPLASTY Left 03/22/2016   Procedure: TOTAL KNEE ARTHROPLASTY;  Surgeon: Dorna Leitz, MD;  Location: [facility];  Service: Orthopedics;  Laterality: Left;   TUBAL LIGATION  1981   VAGINAL HYSTERECTOMY  04/20/05   prolapse, adenomyosis    Family History  Problem Relation Age of Onset   Osteoporosis Mother    Stroke Mother        mini   Prostate cancer Father    Heart disease Brother 61       shunt put in heart   Osteoarthritis Maternal Grandmother    Bipolar disorder Son    Liver disease Son    Breast cancer Cousin        1st cousin   Colon cancer Neg Hx     Social History:  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  She reports that she has never smoked. She has never used smokeless tobacco. She reports current alcohol use--glass of wine with dinner. She reports that she does not use drugs.   Allergies  Allergen Reactions   Gluten Meal Other (See Comments)    celiac disease   Cefdinir Rash    Facility-Administered Medications Prior to Admission  Medication Dose Route Frequency Provider Last Rate Last Admin   0.9 %  sodium chloride infusion  500 mL Intravenous Continuous [name], Estill Cotta III, MD       Medications Prior to Admission  Medication Sig Dispense Refill   apixaban (ELIQUIS) 5 MG TABS tablet TAKE 1 TABLET BY MOUTH TWICE A DAY 180 tablet 1   gabapentin (NEURONTIN) 300 MG capsule Take 300 mg by mouth at bedtime.     ibuprofen (ADVIL,MOTRIN) 200 MG tablet Take 400 mg by mouth every 6 (six) hours as needed for moderate pain.     loratadine (CLARITIN) 10 MG tablet Take 10 mg by mouth daily as needed for allergies.      metoprolol succinate (TOPROL-XL) 25 MG 24 hr tablet TAKE 1 TABLET BY MOUTH EVERY DAY 90 tablet 3   Polyethyl Glycol-Propyl Glycol (SYSTANE) 0.4-0.3 % SOLN Place 1-2 drops into both eyes 3 (three) times daily as needed (dry eyes).     psyllium (METAMUCIL) 58.6 % packet Take 1 packet by mouth in the morning.     timolol (TIMOPTIC) 0.5 % ophthalmic solution Place 1 drop into the left eye 2 (two) times daily.     ZIOPTAN 0.0015 % SOLN Place 1 drop into the left eye at bedtime.      Drug Regimen Review  Drug regimen was reviewed and remains appropriate with no significant issues identified  Home: Home Living Family/patient expects to be discharged to:: Private residence Living Arrangements: Spouse/significant other, Children Available Help at Discharge: Family, Available PRN/intermittently Type of Home: House Home Access: Stairs to enter [employer]  of Steps: 3-4 Entrance Stairs-Rails: Left Home Layout: Multi-level, Able to live on main level with bedroom/bathroom, Full bath on main level Bathroom Shower/Tub: Tub/shower unit, Walk-in shower Home Equipment: None Additional Comments: Lives with husband and son; husband works-- per PT eval   Functional History: Prior Function Level of Independence: Independent Comments: Independent without DME; retired Press photographer; drives; enjoys babysitting grandkids (75 and 24 y.o.), enjoys time with cat and dog, watching tv  Functional Status:  Mobility: Bed Mobility Overal bed mobility: Needs Assistance Bed Mobility: Rolling, Sidelying to Sit Rolling: Mod assist Sidelying to sit: Mod assist, +2 for safety/equipment Supine to sit: Total assist, +2 for physical assistance Sit to supine: Total assist, +2 for physical assistance Sit to sidelying: Max assist, +2 for physical assistance, +2 for safety/equipment General bed mobility comments: Pt able to roll R/L multiple times for repositioning and pericare, assisting well  with UE support on rails, requiring mod-maxA for BLE management and to complete pelvic rotation; modA for trunk elevation and scooting hips to EOB to sit Transfers Overall transfer level: Needs assistance Equipment used: 2 person hand held assist Transfer via Lift Equipment: Stedy Transfers: Public house manager Sit to Stand: Max assist, +2 physical assistance (+3 for posterior support) Stand pivot transfers: Total assist Squat pivot transfers: Max assist, +2 physical assistance  Lateral/Scoot Transfers: Max assist, +2 physical assistance, +2 safety/equipment General transfer comment: MaxA+2 for squat pivot transfer from bed to drop arm recliner, pt able to assist with bilateral HHA on therapists' arms, totalA for BLE positioning for transfer; pt declined standing trial from recliner secondary to fatigue Ambulation/Gait General Gait Details: Unable to assess    ADL: ADL Overall ADL's : Needs assistance/impaired Eating/Feeding: NPO Grooming: Minimal assistance, Sitting Grooming Details (indicate cue type and reason): min assist +2, unsupported sitting requires min-mod assist to maintain balance and comb hair with L hand Upper Body Dressing : Minimal assistance, Bed level Upper Body Dressing Details (indicate cue type and reason): able to thread BUEs into new gown with increased time Lower Body Dressing: Total assistance, +2 for physical assistance, +2 for safety/equipment, Sitting/lateral leans, Bed level Lower Body Dressing Details (indicate cue type and reason): assist for socks and shoes Toilet Transfer: Maximal assistance, +2 for physical assistance, +2 for safety/equipment, Squat-pivot Toilet Transfer Details (indicate cue type and reason): simulated to recliner Toileting- Clothing Manipulation and Hygiene: Total assistance, +2 for physical assistance, +2 for safety/equipment, Bed level Toileting - Clothing Manipulation Details (indicate cue type and reason): rolling in bed, total  assist Functional mobility during ADLs: Maximal assistance, +2 for physical assistance General ADL Comments: Focused session on sit<>stand at EOB and then participating in exercises for UEs.  Cognition: Cognition Overall Cognitive Status: Within Functional Limits for tasks assessed Orientation Level: Oriented X4 Cognition Arousal/Alertness: Awake/alert Behavior During Therapy: WFL for tasks assessed/performed Overall Cognitive Status: Within Functional Limits for tasks assessed General Comments: pt following commands well, cues at times for motivation as pt fatigues Difficult to assess due to: Tracheostomy   Blood pressure 104/60, pulse 90, temperature 97.7 F (36.5 C), temperature source Oral, resp. rate 20, height _0  (1.626 m), weight 61.6 kg, last menstrual period 04/20/2005, SpO2 96 %. Physical Exam Vitals reviewed.  Constitutional:      General: She is not in acute distress.    Appearance: She is normal weight. She is not ill-appearing.     Comments: + NG  HENT:     Head: Normocephalic and atraumatic.  Right Ear: External ear normal.     Left Ear: External ear normal.     Nose: Nose normal.  Eyes:     General:        Right eye: No discharge.        Left eye: No discharge.     Extraocular Movements: Extraocular movements intact.  Cardiovascular:     Rate and Rhythm: Normal rate and regular rhythm.  Pulmonary:     Effort: Pulmonary effort is normal. No respiratory distress.     Breath sounds: No stridor.  Abdominal:     General: Abdomen is flat.     Comments: Hypoactive bowel sounds  Musculoskeletal:     Cervical back: Normal range of motion and neck supple.     Comments: No edema or tenderness in extremities  Skin:    General: Skin is warm and dry.     Comments: Neck dressing CDI  Neurological:     Mental Status: She is alert.     Comments: Alert and oriented Motor: Bilateral upper extremities: 4+/5 proximal distal Bilateral lower extremities: Hip  flexion, knee extension 3/5, ankle dorsiflexion 3+/5 Sensation diminished to light touch distal to knees as well as fingertips  Psychiatric:        Mood and Affect: Mood normal.        Behavior: Behavior normal.   Results for orders placed or performed during the hospital encounter of 02/05/21 (from the past 48 hour(s))  Glucose, capillary     Status: Abnormal   Collection Time: 03/02/21 11:31 AM  Result Value Ref Range   Glucose-Capillary 117 (H) 70 - 99 mg/dL    Comment: Glucose reference range applies only to samples taken after fasting for at least 8 hours.  Glucose, capillary     Status: Abnormal   Collection Time: 03/02/21  4:38 PM  Result Value Ref Range   Glucose-Capillary 110 (H) 70 - 99 mg/dL    Comment: Glucose reference range applies only to samples taken after fasting for at least 8 hours.  Glucose, capillary     Status: Abnormal   Collection Time: 03/02/21  7:43 PM  Result Value Ref Range   Glucose-Capillary 111 (H) 70 - 99 mg/dL    Comment: Glucose reference range applies only to samples taken after fasting for at least 8 hours.  Glucose, capillary     Status: Abnormal   Collection Time: 03/02/21 11:37 PM  Result Value Ref Range   Glucose-Capillary 106 (H) 70 - 99 mg/dL    Comment: Glucose reference range applies only to samples taken after fasting for at least 8 hours.  Glucose, capillary     Status: Abnormal   Collection Time: 03/03/21  3:28 AM  Result Value Ref Range   Glucose-Capillary 110 (H) 70 - 99 mg/dL    Comment: Glucose reference range applies only to samples taken after fasting for at least 8 hours.  Glucose, capillary     Status: Abnormal   Collection Time: 03/03/21  6:39 AM  Result Value Ref Range   Glucose-Capillary 117 (H) 70 - 99 mg/dL    Comment: Glucose reference range applies only to samples taken after fasting for at least 8 hours.  Basic metabolic panel     Status: Abnormal   Collection Time: 03/03/21  7:04 AM  Result Value Ref Range    Sodium 130 (L) 135 - 145 mmol/L   Potassium 4.0 3.5 - 5.1 mmol/L   Chloride 97 (L) 98 - 111 mmol/L  CO2 26 22 - 32 mmol/L   Glucose, Bld 125 (H) 70 - 99 mg/dL    Comment: Glucose reference range applies only to samples taken after fasting for at least 8 hours.   BUN 15 8 - 23 mg/dL   Creatinine, Ser 0.41 (L) 0.44 - 1.00 mg/dL   Calcium 8.5 (L) 8.9 - 10.3 mg/dL   GFR, Estimated >60 >60 mL/min    Comment: (NOTE) Calculated using the CKD-EPI Creatinine Equation (2021)    Anion gap 7 5 - 15    Comment: Performed at [facility] [address] [place], [place] [address]  Glucose, capillary     Status: Abnormal   Collection Time: 03/03/21 11:47 AM  Result Value Ref Range   Glucose-Capillary 107 (H) 70 - 99 mg/dL    Comment: Glucose reference range applies only to samples taken after fasting for at least 8 hours.  Glucose, capillary     Status: Abnormal   Collection Time: 03/03/21  4:16 PM  Result Value Ref Range   Glucose-Capillary 119 (H) 70 - 99 mg/dL    Comment: Glucose reference range applies only to samples taken after fasting for at least 8 hours.  Glucose, capillary     Status: Abnormal   Collection Time: 03/03/21  7:24 PM  Result Value Ref Range   Glucose-Capillary 128 (H) 70 - 99 mg/dL    Comment: Glucose reference range applies only to samples taken after fasting for at least 8 hours.  Glucose, capillary     Status: None   Collection Time: 03/03/21 11:01 PM  Result Value Ref Range   Glucose-Capillary 91 70 - 99 mg/dL    Comment: Glucose reference range applies only to samples taken after fasting for at least 8 hours.  Glucose, capillary     Status: Abnormal   Collection Time: 03/04/21  3:40 AM  Result Value Ref Range   Glucose-Capillary 131 (H) 70 - 99 mg/dL    Comment: Glucose reference range applies only to samples taken after fasting for at least 8 hours.  Glucose, capillary     Status: None   Collection Time: 03/04/21  6:45 AM  Result Value Ref Range    Glucose-Capillary 97 70 - 99 mg/dL    Comment: Glucose reference range applies only to samples taken after fasting for at least 8 hours.   No results found.     Medical Problem List and Plan: 1.  Deficits with mobility, endurance, transfers, self-care secondary to AIDP.  -patient may shower with neck covered  -ELOS/Goals: 20-24 days/min a  Admit to CIR 2.  Antithrombotics: -DVT/anticoagulation:  Pharmaceutical: Other (comment)--Eliquis bid  -antiplatelet therapy: -- N/A 3. Scoliosis/Pain Management: Lidocaine patches. Gabapentin 200 mg/HS per Dr. Saintclair Halsted.    -low dose oxycodone prn  --Monitor with increased exertion 4. Mood:  LCSW to follow for evaluation and support.   -antipsychotic agents: N/A 5. Neuropsych: This patient is capable of making decisions on her own behalf. 6. Skin/Wound Care: Routine pressure relief measures.  7. Fluids/Electrolytes/Nutrition: Monitor I/Os--feels full "food being shoved at her all the time."  --CMP ordered for tomorrow. Continue K dur for now.  8. A fib: Monitor HR TID--amiodarone decreased to once daily on 09/06  --metoprolol d/c due to hypotension  Monitor with increased activity 9. Urinary retention: Schedule toileting--will monitor voiding with PVR checks.   --urecholine increased to 20 mg TID on 09/07-->monitor for orthostasis. 10. Dysphagia: Has resolved and tolerating regular diet but full due to ongoing  tube feeds  --will hold tube feeds --change to oral supplements. Start calorie count  11. Hypoxic hypercarbic respiratory failure: Continue aspiration precautions.  12. Hyponatremia: Na improving from nadir of 115-->127/130 for the past week.   --CMP ordered for tomorrow 13.  Sleep disturbance: Continue Seroquel and melatonin  --need to cautious re: uptiration-->hypotension  --Wean as tolerated 14. Hyperglycemia:  Hgb a1C-5.0-->likely due to tube feeds and supplement between meals.  ---will monitor CBG X 48 hours.   Bary Leriche,  PA-C 03/04/2021   I have personally performed a face to face diagnostic evaluation, including, but not limited to relevant history and physical exam findings, of this patient and developed relevant assessment and plan.  Additionally, I have reviewed and concur with the physician assistant's documentation above.  Delice Lesch, MD, ABPMR

## 2021-03-04 NOTE — Progress Notes (Signed)
PMR Admission Coordinator Pre-Admission Assessment   Patient: Angela Morales is an 75 y.o., female MRN: [id number] DOB: 07/16/1945 Height: 5' 4"  (162.6 cm) Weight: 65.3 kg   Insurance Information HMO: yes    PPO:      PCP:      IPA:      80/20:      OTHER:  PRIMARY: [employer] Medicare      Policy#: [id number]      Subscriber: pt CM Name: Helene Kelp      Phone#: [phone]     Fax#: [phone] Pre-Cert#: [id number]  [place] for CIR provided by Helene Kelp with Bernadene Bell with updates due to fax listed above on 9/13   Employer:  Benefits:  Phone #: [phone]     Name:  Eff. Date: 06/28/20     Deduct: $0      Out of Pocket Max: $3900 (met [phone])      Life Max: n/a CIR: $295/day for days 1-5      SNF: 20 full days Outpatient:      Co-Pay: $30/visit Home Health: 100%      Co-Pay:  DME: 80%     Co-Pay: 20% Providers:  SECONDARY:       Policy#:      Phone#:    Development worker, community:       Phone#:    The Therapist, art Information Summary" for patients in Inpatient Rehabilitation Facilities with attached "Privacy Act [place] Records" was provided and verbally reviewed with: Patient and Family   Emergency Contact Information Contact Information       Name Relation Home Work [place] Spouse [phone]   [phone]           Current Medical History  Patient Admitting Diagnosis: Guillain Barre Syndrome    History of Present Illness: pt is a 75 y/o female with PMH of afib, s/p ablation on 8/3, and celiac disease who was admitted to [facility] on 8/11 with bilat upper and bil lower extremeity weakness and parasthesias beginning 8/8.  Pt developed ascending weakness with inability to ambulate.  No other prodrome or precipitates other than being started on gabapentin 1 month prior.  LP was deferred initially as she was on Eliquis (last dose 8/11).  IVIG started empirically on 8/12 and she continued to have some progressive weakness and bulbar dysfunction with difficulty  swallong and globus sensation with swallow, and voice weakness.  FVC has been consistently greater than 1.0L , NIF <-25.  She had an episode of desaturation, increased work of breathing and choking when eating Jell-O and pudding evening of 8/13 and required intubation on 8/14.  Underwent trach on 8/16.  Started round 2 of IVIG on 8/18-8/22 per neuro recs.  Developed aflutter on 8/27 and started 8/28 amio drip.  Bouts of hypotension over night, not responding to increased fluids, though to be due to vasoplegia.  She was cardioverted into sinus rhythm on 8/31. Currently tolerating a regular, gluten free diet.  On 28% trach collar.  Therapy evaluations completed and pt was recommended for CIR.    Patient's [facility] record from [facility] has been reviewed by the rehabilitation admission coordinator and physician.   Past Medical History      Past Medical History:  Diagnosis Date   Abnormal uterine bleeding      on HRT   Allergy      seasonal   Arthritis     Atrial fibrillation (HCC)     Celiac disease  DDD (degenerative disc disease)     Dysrhythmia     Glaucoma     Microscopic colitis     Osteoarthritis of both knees     PONV (postoperative nausea and vomiting)     SVT (supraventricular tachycardia) (HCC)        Has the patient had major surgery during 100 days prior to admission? Yes   Family History   family history includes Bipolar disorder in her son; Breast cancer in her cousin; Heart disease (age of onset: 9) in her brother; Liver disease in her son; Osteoarthritis in her maternal grandmother; Osteoporosis in her mother; Prostate cancer in her father; Stroke in her mother.   Current Medications   Current Facility-Administered Medications:    0.9 %  sodium chloride infusion, , Intravenous, PRN, [name], MD, Stopped at 02/24/21 2338   0.9 %  sodium chloride infusion, 250 mL, Intravenous, Continuous, Ogan, Kerry Kass, MD, Stopped at 02/24/21 2321   0.9 %  sodium chloride  infusion, 250 mL, Intravenous, Continuous, Ogan, Kerry Kass, MD, Last Rate: 10 mL/hr at 02/25/21 0536, Restarted at 02/25/21 0536   acetaminophen (TYLENOL) 160 MG/5ML solution 650 mg, 650 mg, Oral, Q6H PRN, Minor, Grace Bushy, NP, 650 mg at 02/21/21 2122   albuterol (PROVENTIL) (2.5 MG/3ML) 0.083% nebulizer solution 2.5 mg, 2.5 mg, Nebulization, Q4H PRN, Minor, Grace Bushy, NP   amiodarone (PACERONE) tablet 200 mg, 200 mg, Per Tube, BID, 200 mg at 02/27/21 1117 **FOLLOWED BY** [START ON 03/03/2021] amiodarone (PACERONE) tablet 200 mg, 200 mg, Per Tube, Daily, Erick Colace, NP   apixaban Arne Cleveland) tablet 5 mg, 5 mg, Per Tube, BID, Lyndee Leo, RPH, 5 mg at 02/27/21 1117   bethanechol (URECHOLINE) tablet 10 mg, 10 mg, Per Tube, TID, Kipp Brood, MD, 10 mg at 02/27/21 1117   chlorhexidine gluconate (MEDLINE KIT) (PERIDEX) 0.12 % solution 15 mL, 15 mL, Mouth Rinse, BID, Noemi Chapel P, DO, 15 mL at 02/27/21 4540   Chlorhexidine Gluconate Cloth 2 % PADS 6 each, 6 each, Topical, Daily, Minor, Grace Bushy, NP, 6 each at 02/27/21 1119   cyclobenzaprine (FLEXERIL) tablet 5 mg, 5 mg, Per Tube, BID PRN, Bowser, Laurel Dimmer, NP   docusate (COLACE) 50 MG/5ML liquid 100 mg, 100 mg, Per Tube, BID, Noemi Chapel P, DO, 100 mg at 02/18/21 1025   feeding supplement (OSMOLITE 1.5 CAL) liquid 1,000 mL, 1,000 mL, Per Tube, Continuous, Julian Hy, DO, Last Rate: 50 mL/hr at 02/26/21 1248, 1,000 mL at 02/26/21 1248   feeding supplement (PROSource TF) liquid 45 mL, 45 mL, Per Tube, BID, Clark, Laura P, DO, 45 mL at 02/27/21 1118   gabapentin (NEURONTIN) 250 MG/5ML solution 200 mg, 200 mg, Per Tube, QHS, [name], MD, 200 mg at 02/26/21 2130   Gerhardt's butt cream, , Topical, PRN, Kipp Brood, MD, 1 application at 98/11/91 0800   guaiFENesin (ROBITUSSIN) 100 MG/5ML solution 300 mg, 15 mL, Per Tube, Q6H, Jennelle Human B, NP, 300 mg at 02/27/21 1118   insulin aspart (novoLOG) injection 0-15 Units, 0-15 Units,  Subcutaneous, Q4H, Icard, Bradley L, DO, 2 Units at 02/27/21 1352   labetalol (NORMODYNE) injection 10 mg, 10 mg, Intravenous, Q2H PRN, [name], MD, 10 mg at 02/12/21 1033   latanoprost (XALATAN) 0.005 % ophthalmic solution 1 drop, 1 drop, Left Eye, QHS, Minor, Grace Bushy, NP, 1 drop at 02/26/21 2225   lidocaine (LIDODERM) 5 % 1 patch, 1 patch, Transdermal, Q24H, Gleason, Otilio Carpen,  PA-C, 1 patch at 02/26/21 1730   lip balm (BLISTEX) ointment, , Topical, PRN, [name], MD, Given at 02/16/21 1958   LORazepam (ATIVAN) injection 0.5-1 mg, 0.5-1 mg, Intravenous, Q4H PRN, Frederik Pear, MD, 0.5 mg at 02/17/21 2313   MEDLINE mouth rinse, 15 mL, Mouth Rinse, 10 times per day, Noemi Chapel P, DO, 15 mL at 02/27/21 0531   melatonin tablet 3 mg, 3 mg, Per Tube, QHS, Lyndee Leo, RPH, 3 mg at 02/26/21 2225   oxyCODONE (Oxy IR/ROXICODONE) immediate release tablet 2.5 mg, 2.5 mg, Per Tube, Q3H PRN, [name], MD, 2.5 mg at 02/27/21 0531   pantoprazole sodium (PROTONIX) 40 mg/20 mL oral suspension 40 mg, 40 mg, Per Tube, Q1200, Erick Colace, NP, 40 mg at 02/27/21 1118   polyethylene glycol (MIRALAX / GLYCOLAX) packet 17 g, 17 g, Per Tube, Daily, Noemi Chapel P, DO, 17 g at 02/17/21 7412   polyvinyl alcohol (LIQUIFILM TEARS) 1.4 % ophthalmic solution 1 drop, 1 drop, Both Eyes, PRN, [name], MD, 1 drop at 02/20/21 8786   potassium chloride (KLOR-CON) packet 60 mEq, 60 mEq, Per Tube, Daily, Erick Colace, NP, 60 mEq at 02/27/21 1117   QUEtiapine (SEROQUEL) tablet 25 mg, 25 mg, Per Tube, QHS, Julian Hy, DO, 25 mg at 02/26/21 2225   senna-docusate (Senokot-S) tablet 1 tablet, 1 tablet, Per Tube, QHS PRN, Lyndee Leo, RPH   sodium chloride flush (NS) 0.9 % injection 10-40 mL, 10-40 mL, Intracatheter, Q12H, Minor, Grace Bushy, NP, 10 mL at 02/27/21 1119   sodium chloride flush (NS) 0.9 % injection 10-40 mL, 10-40 mL, Intracatheter, PRN, Minor, Grace Bushy, NP   timolol  (TIMOPTIC) 0.5 % ophthalmic solution 1 drop, 1 drop, Left Eye, BID, Minor, Grace Bushy, NP, 1 drop at 02/27/21 1119   Patients Current Diet:  Diet Order                  Diet gluten free Room service appropriate? Yes; Fluid consistency: Thin  Diet effective now                         Precautions / Restrictions Precautions Precautions: Fall Precaution Comments: trach, cortrak Restrictions Weight Bearing Restrictions: No    Has the patient had 2 or more falls or a fall with injury in the past year? No   Prior Activity Level Community (5-7x/wk): fully independent with no DME prior to admit, driving, watching grand kids   Prior Functional Level Self Care: Did the patient need help bathing, dressing, using the toilet or eating? Independent   Indoor Mobility: Did the patient need assistance with walking from room to room (with or without device)? Independent   Stairs: Did the patient need assistance with internal or external stairs (with or without device)? Independent   Functional Cognition: Did the patient need help planning regular tasks such as shopping or remembering to take medications? Independent   Patient Information Are you of Hispanic, Latino/a,or Spanish origin?: A. No, not of Hispanic, Latino/a, or Spanish origin What is your race?: A. White Do you need or want an interpreter to communicate with a doctor or health care staff?: 0. No   Patient's Response To:  Health Literacy and Transportation Is the patient able to respond to health literacy and transportation needs?: Yes Health Literacy - How often do you need to have someone help you when you read instructions, pamphlets, or other written  material from your doctor or pharmacy?: Never In the past 12 months, has lack of transportation kept you from medical appointments or from getting medications?: No In the past 12 months, has lack of transportation kept you from meetings, work, or from getting things needed for  daily living?: No   Home Assistive Devices / Equipment Home Equipment: None   Prior Device Use: Indicate devices/aids used by the patient prior to current illness, exacerbation or injury? None of the above   Current Functional Level Cognition   Overall Cognitive Status: Within Functional Limits for tasks assessed Difficult to assess due to: Tracheostomy Orientation Level: Oriented X4 General Comments: pt following commands well, cues at times for motivation as pt fatigues    Extremity Assessment (includes Sensation/Coordination)   Upper Extremity Assessment: RUE deficits/detail, LUE deficits/detail RUE Deficits / Details: able to follow simple commands to test movement but <3/5 throughout UE, unable to raise at shoulder but able to control elbow/hand/wrist with increased time RUE Sensation: decreased light touch, decreased proprioception RUE Coordination: decreased fine motor, decreased gross motor LUE Deficits / Details: able to follow simple commands to test movement but <3/5 throughout UE, unable to raise at shoulder but able to control elbow/hand/wrist with increased time (extension >flexion) LUE Sensation: decreased light touch, decreased proprioception LUE Coordination: decreased fine motor, decreased gross motor  Lower Extremity Assessment: Defer to PT evaluation RLE Deficits / Details: BLEs grossly 0/5 toes, 1/5 ankle DF, 2/5 ankle PF, 2/5 knee extension, 2/5 hip abd/add, 1/5 hip flex; pt endorses no sensation on toes and plantar surface foot, light touch diminished throughout lower legs/feet (worse on L side); bilateral feet wanting to rest inverted and plantar flexed RLE Sensation: decreased light touch, decreased proprioception RLE Coordination: decreased fine motor, decreased gross motor LLE Deficits / Details: BLEs grossly 0/5 toes, 1/5 ankle DF, 2/5 ankle PF, 2/5 knee extension, 2/5 hip abd/add, 1/5 hip flex; pt endorses no sensation on toes and plantar surface foot, light  touch diminished throughout lower legs/feet (worse on L side); bilateral feet wanting to rest inverted and plantar flexed LLE Sensation: decreased light touch, decreased proprioception LLE Coordination: decreased fine motor, decreased gross motor     ADLs   Overall ADL's : Needs assistance/impaired Eating/Feeding: NPO Grooming: Minimal assistance, Sitting Grooming Details (indicate cue type and reason): min assist +2, unsupported sitting requires min-mod assist to maintain balance and comb hair with L hand Upper Body Dressing : Minimal assistance, Bed level Upper Body Dressing Details (indicate cue type and reason): able to thread BUEs into new gown with increased time Lower Body Dressing: Total assistance, Sitting/lateral leans Lower Body Dressing Details (indicate cue type and reason): assist for socks Toilet Transfer: Total assistance, +2 for safety/equipment, +2 for physical assistance Toilet Transfer Details (indicate cue type and reason): pt declined due to fatigue Toileting- Clothing Manipulation and Hygiene: +2 for physical assistance, Total assistance, +2 for safety/equipment, Bed level Functional mobility during ADLs: Total assistance, +2 for physical assistance, +2 for safety/equipment General ADL Comments: remains limited by decreased activity tolerance and endurance, impaired balance and weakness     Mobility   Overal bed mobility: Needs Assistance Bed Mobility: Rolling, Sidelying to Sit, Sit to Sidelying Rolling: Min assist Sidelying to sit: Max assist Supine to sit: Total assist, +2 for physical assistance Sit to supine: Total assist, +2 for physical assistance Sit to sidelying: Max assist, +2 for physical assistance General bed mobility comments: pt rolling with min assist, requires max assist for LB and min-mod  assist for UB support given max cueing for sequecning and technique     Transfers   Overall transfer level: Needs assistance Equipment used: Ambulation  equipment used Transfer via Lift Equipment: Stedy Transfers: Lateral/Scoot Transfers Sit to Stand: Max assist, +2 physical assistance, +2 safety/equipment (+3 to monitor foot/ankle position) Stand pivot transfers: Total assist  Lateral/Scoot Transfers: Max assist, +2 physical assistance, +2 safety/equipment General transfer comment: pt declined due to fatigue     Ambulation / Gait / Stairs / Wheelchair Mobility   Ambulation/Gait General Gait Details: Unable to assess     Posture / Balance Dynamic Sitting Balance Sitting balance - Comments: min guard at best statically with up to min assist for dynamic sitting during ADLs Balance Overall balance assessment: Needs assistance Sitting-balance support: Bilateral upper extremity supported, Feet supported, No upper extremity supported Sitting balance-Leahy Scale: Poor Sitting balance - Comments: min guard at best statically with up to min assist for dynamic sitting during ADLs Standing balance-Leahy Scale: Zero     Special needs/care consideration Continuous Drip IV  osmolite 50 ml/hr, Oxygen 5L/28%, Trach size 6 cuffed, and Diabetic management yes    Previous Home Environment (from acute therapy documentation) Living Arrangements: Spouse/significant other, Children Available Help at Discharge: Family, Available PRN/intermittently Type of Home: House Home Layout: Multi-level, Able to live on main level with bedroom/bathroom, Full bath on main level Home Access: Stairs to enter Entrance Stairs-Rails: Left Entrance Stairs-Number of Steps: 3-4 Bathroom Shower/Tub: Tub/shower unit, Walk-in shower Additional Comments: Lives with husband and son; husband works-- per [employer] eval   Discharge Living Setting Plans for Discharge Living Setting: Patient's home, Lives with (comment) (spouse and adult son) Type of Home at Discharge: House Discharge Home Layout: Able to live on main level with bedroom/bathroom Discharge Home Access: Stairs to  enter Entrance Stairs-Rails: Left Entrance Stairs-Number of Steps: 3-4 Discharge Bathroom Shower/Tub: Tub/shower unit, Walk-in shower Discharge Bathroom Toilet: Standard Discharge Bathroom Accessibility: Yes How Accessible: Accessible via walker, Accessible via wheelchair Does the patient have any problems obtaining your medications?: No   Social/Family/Support Systems Patient Roles: Spouse Anticipated Caregiver: Diedra Sinor Anticipated Caregiver's Contact Information: [phone] Caregiver Availability: 24/7 Discharge Plan Discussed with Primary Caregiver: Yes Is Caregiver In Agreement with Plan?: Yes Does Caregiver/Family have Issues with Lodging/Transportation while Pt is in Rehab?: No   Goals Patient/Family Goal for Rehab: PT/OT min assist w/c level, SLP supervision to mod I Expected length of stay: 28-32 days Pt/Family Agrees to Admission and willing to participate: Yes Program Orientation Provided & Reviewed with Pt/Caregiver Including Roles  & Responsibilities: Yes  Barriers to Discharge: Insurance for SNF coverage, Home environment access/layout   Decrease burden of Care through IP rehab admission: n/a   Possible need for SNF placement upon discharge: Not antipcated   Patient Condition: I have reviewed medical records from [facility], spoken with CM, and patient and spouse. I met with patient at the bedside and discussed via phone for inpatient rehabilitation assessment.  Patient will benefit from ongoing PT, OT, and SLP, can actively participate in 3 hours of therapy a day 5 days of the week, and can make measurable gains during the admission.  Patient will also benefit from the coordinated team approach during an Inpatient Acute Rehabilitation admission.  The patient will receive intensive therapy as well as Rehabilitation physician, nursing, social worker, and care management interventions.  Due to bladder management, bowel management, safety, skin/wound care, disease  management, medication administration, pain management, and patient education the patient requires 82  hour a day rehabilitation nursing.  The patient is currently max +2 with mobility and basic ADLs.  Discharge setting and therapy post discharge at home with home health is anticipated.  Patient has agreed to participate in the Acute Inpatient Rehabilitation Program and will admit today.   Preadmission Screen Completed By:  Michel Santee, PT, DPT 02/27/2021 2:23 PM ______________________________________________________________________   Discussed status with Dr. Posey Pronto on 10:01 AM  at .today  and received approval for admission today.   Admission Coordinator:  Michel Santee, PT, DPT time 10:01 AM Sudie Grumbling 03/04/21     Assessment/Plan: Diagnosis: Guillain Barre Syndrome  Does the need for close, 24 hr/day Medical supervision in concert with the patient's rehab needs make it unreasonable for this patient to be served in a less intensive setting? Yes Co-Morbidities requiring supervision/potential complications: afib, s/p ablation on 8/3, celiac disease, supplemental oxygen dependent Due to bowel management, safety, skin/wound care, disease management, pain management, and patient education, does the patient require 24 hr/day rehab nursing? Yes Does the patient require coordinated care of a physician, rehab nurse, PT, OT to address physical and functional deficits in the context of the above medical diagnosis(es)? Yes Addressing deficits in the following areas: balance, endurance, locomotion, strength, transferring, bathing, dressing, toileting, swallowing, and psychosocial support Can the patient actively participate in an intensive therapy program of at least 3 hrs of therapy 5 days a week? Yes The potential for patient to make measurable gains while on inpatient rehab is excellent Anticipated functional outcomes upon discharge from inpatient rehab: supervision and min assist PT, supervision and min  assist OT, modified independent SLP Estimated rehab length of stay to reach the above functional goals is: 22-24 days. Anticipated discharge destination: Home 10. Overall Rehab/Functional Prognosis: good     MD Signature: Delice Lesch, MD, ABPMR

## 2021-03-04 NOTE — Progress Notes (Signed)
  Speech Language Pathology Treatment: Dysphagia  Patient Details Name: Angela Morales MRN: [id number] DOB: [birth year] Today's Date: 03/04/2021 Time: 4142-3953 SLP Time Calculation (min) (ACUTE ONLY): 15 min  Assessment / Plan / Recommendation Clinical Impression  Pt seen at bedside for assessment of diet tolerance. Pt was eating lunch (chili and coffee), and was observed self feeding these textures. Pt reports no difficulty swallowing, and was observed to tolerate PO trials without difficulty. Daughter was present, and was encouraging pt to eat more for removal of Cortrak. RMST was not initiated this session, as pt was just decannulated yesterday. Will defer further ST intervention to CIR speech therapy - DC planned today.    HPI HPI: 75 y.o. female admitted 02/05/21 with c/o progressive BUE/BLE numbness and weakness; diagnosed with Guillain-Barr syndrome. Pt also with afib with RVR. Intubated 8/14 due to Acute respiratory failure with hypoxia w/ aspiration PNA and bibasilar atx.[facility] Kitchen MRI negative 8/15. Trach placed 8/16. CT brain 8/16 negative.  PMH includes afib (recent ablation 01/28/21), celiac disease, DDD, gluacoma.      SLP Plan  Continue with current plan of care       Recommendations  Diet recommendations: Regular;Thin liquid Liquids provided via: Cup;Straw Medication Administration: Crushed with puree Supervision: Patient able to self feed Compensations: Slow rate;Small sips/bites Postural Changes and/or Swallow Maneuvers: Seated upright 90 degrees                Oral Care Recommendations: Oral care BID Follow up Recommendations: Inpatient Rehab SLP Visit Diagnosis: Dysphagia, unspecified (R13.10) Plan: Continue with current plan of care       [place] Quentin Ore, [facility], [place] Speech Language Pathologist Office: [phone]  Shonna Chock 03/04/2021, 12:16 PM

## 2021-03-04 NOTE — Progress Notes (Signed)
PT Cancellation Note  Patient Details Name: Angela Morales MRN: [id number] DOB: 1945/10/18   Cancelled Treatment:    Reason Eval/Treat Not Completed: Patient discharging to CIR today, will defer treatment.  Mabeline Caras, PT, DPT Acute Rehabilitation Services  Pager [phone] Office [place] 03/04/2021, 11:08 AM

## 2021-03-04 NOTE — Progress Notes (Signed)
Inpatient Rehabilitation Medication Review by a Pharmacist  A complete drug regimen review was completed for this patient to identify any potential clinically significant medication issues.  High Risk Drug Classes Is patient taking? Indication by Medication  Antipsychotic Yes Quetiapine for sleep disturbance  Anticoagulant Yes Apixaban for atrial fibrillation  Antibiotic No   Opioid Yes Oxycodone PRN for pain  Antiplatelet No   Hypoglycemics/insulin Yes Sliding scale aspart insulin for hyperglycemia  Vasoactive Medication No   Chemotherapy No   Other No      Type of Medication Issue Identified Description of Issue Recommendation(s)  Drug Interaction(s) (clinically significant)     Duplicate Therapy  Pt had duplicate sliding scale insulin orders Discontinue one of the sliding scale orders (done, per conversation with D. [place], PA)  Allergy     No Medication Administration End Date     Incorrect Dose     Additional Drug Therapy Needed     Significant med changes from prior encounter (inform family/care partners about these prior to discharge). Pt no longer taking the following home meds: Metoprolol XL (due to hypotension) Ibuprofen Loratadine Inform pt/family prior to discharge  Other       Clinically significant medication issues were identified that warrant physician communication and completion of prescribed/recommended actions by midnight of the next day:  Yes  Name of provider notified for urgent issues identified:  Silvestre Mesi, PA  Provider Method of Notification:  After hours phone conversation re: duplicate sliding scale insulin orders  Time spent performing this drug regimen review (minutes):  [place], PharmD, BCPS, [facility] Clinical Pharmacist 03/04/2021 5:59 PM

## 2021-03-04 NOTE — H&P (Signed)
Physical Medicine and Rehabilitation Admission H&P    Chief Complaint  Patient presents with   Functional deficits due to GBS    HPI: Angela Morales is a 75 year old LH-female with history of A fib s/p recent ablation, severe lumbar scoliosis (not amenable to surgery), celiac disease, SVT who was admitted on 02/06/21 with 4-day history of tingling in hand and feet progressing to diffuse weakness with numbness (R) > (L) and inabilty to walk.  History taken from chart review, patient, and family.  Neurology consulted and she was started on IVIG for AIDP and eliquis transitioned to heparin due to plans for LP. She continued to worsen with increase WOB, concerns of aspiration event with supper and bouts of confusion with agitation felt to be due to hypoxic hypercarbic respiratory failure. She was intubated on 02/08/2021 and treated with course of Zosyn.  She has progressive hyponatremia, felt to be expected in setting of GBS and drop to 115 this was treated with hypertonic saline. She had issues with chest discomfort due to atrial fibrillation with RVR requiring IV diltiazem as well as amiodarone infusion.   Dr. Ellis Savage consulted for input on acute dysphagia and felt that symptoms due to GBS-->tube feeds recommended for nutritional support. CTA chest negative for PE and showed GB distension with faint pericholecystic stranding --> question early or developing acute cholecystitis and large air filled duodenal diverticulum without focal infection and segmental thickening of sigmoid colon question prior inflammation or chronic smoldering inflammation.  Follow up GB ultrasound showed distended GB without sones, pericholecystic fluid and no biliary dilatation. She underwent trach placement on 02/10/2021 by Dr. Tamala Julian and had brief episode of unresponsiveness. CT head unremarkable for acute intracranial process. She briefly needed Neo-synephrine due to hypotension secondary to BB and Seroquel.  She  developed leukocytosis with malaise on 02/19/2021 and was treated with 5 day course of Cefazolin for BAL positive for Staph/Kleb.     MBS done without evidence of dysphagia and was started on regular diet with tube feeds ongoing. She was slowly weaned to ATC and decannulatted by 09/06. She continues to be limited by diffuse weakness, balance deficits, fatigue and working on balance at EOB. CIR recommended due to functional decline.  Please see preadmission assessment from earlier today as well.  Review of Systems  Constitutional:  Negative for chills and fever.  HENT:  Negative for hearing loss.   Eyes:  Negative for blurred vision.       Right lateral visual field loss due to glaucoma  Respiratory:  Negative for cough and shortness of breath.   Cardiovascular:  Negative for chest pain and leg swelling.  Gastrointestinal:  Positive for constipation. Negative for heartburn and nausea.  Genitourinary:  Negative for dysuria.       Has pressure but unable to urinate  Musculoskeletal:  Positive for back pain (chronic). Negative for myalgias.  Skin:  Negative for rash.  Neurological:  Positive for tingling, sensory change, speech change, focal weakness and weakness.  Psychiatric/Behavioral:  The patient has insomnia. The patient is not nervous/anxious.   All other systems reviewed and are negative.   Past Medical History:  Diagnosis Date   Abnormal uterine bleeding    on HRT   Allergy    seasonal   Arthritis    Atrial fibrillation (HCC)    Celiac disease    DDD (degenerative disc disease)    Dysrhythmia    Glaucoma    Microscopic colitis    Osteoarthritis  of both knees    PONV (postoperative nausea and vomiting)    SVT (supraventricular tachycardia) (HCC)     Past Surgical History:  Procedure Laterality Date   APPENDECTOMY     ATRIAL FIBRILLATION ABLATION N/A 01/28/2021   Procedure: ATRIAL FIBRILLATION ABLATION;  Surgeon: Constance Haw, MD;  Location: [facility];   Service: Cardiovascular;  Laterality: N/A;   CARDIOVERSION N/A 10/30/2020   Procedure: CARDIOVERSION;  Surgeon: Jerline Pain, MD;  Location: [facility];  Service: Cardiovascular;  Laterality: N/A;   COLONOSCOPY     FOOT FRACTURE SURGERY  10/2013   TONSILLECTOMY AND ADENOIDECTOMY     TOTAL KNEE ARTHROPLASTY Left 03/22/2016   Procedure: TOTAL KNEE ARTHROPLASTY;  Surgeon: Dorna Leitz, MD;  Location: [facility];  Service: Orthopedics;  Laterality: Left;   TUBAL LIGATION  1981   VAGINAL HYSTERECTOMY  04/20/05   prolapse, adenomyosis    Family History  Problem Relation Age of Onset   Osteoporosis Mother    Stroke Mother        mini   Prostate cancer Father    Heart disease Brother 61       shunt put in heart   Osteoarthritis Maternal Grandmother    Bipolar disorder Son    Liver disease Son    Breast cancer Cousin        1st cousin   Colon cancer Neg Hx     Social History:  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  She reports that she has never smoked. She has never used smokeless tobacco. She reports current alcohol use--glass of wine with dinner. She reports that she does not use drugs.   Allergies  Allergen Reactions   Gluten Meal Other (See Comments)    celiac disease   Cefdinir Rash    Facility-Administered Medications Prior to Admission  Medication Dose Route Frequency Provider Last Rate Last Admin   0.9 %  sodium chloride infusion  500 mL Intravenous Continuous [name], Estill Cotta III, MD       Medications Prior to Admission  Medication Sig Dispense Refill   apixaban (ELIQUIS) 5 MG TABS tablet TAKE 1 TABLET BY MOUTH TWICE A DAY 180 tablet 1   gabapentin (NEURONTIN) 300 MG capsule Take 300 mg by mouth at bedtime.     ibuprofen (ADVIL,MOTRIN) 200 MG tablet Take 400 mg by mouth every 6 (six) hours as needed for moderate pain.     loratadine (CLARITIN) 10 MG tablet Take 10 mg by mouth daily as needed for allergies.      metoprolol succinate (TOPROL-XL) 25 MG 24 hr tablet TAKE 1 TABLET BY MOUTH EVERY DAY 90 tablet 3   Polyethyl Glycol-Propyl Glycol (SYSTANE) 0.4-0.3 % SOLN Place 1-2 drops into both eyes 3 (three) times daily as needed (dry eyes).     psyllium (METAMUCIL) 58.6 % packet Take 1 packet by mouth in the morning.     timolol (TIMOPTIC) 0.5 % ophthalmic solution Place 1 drop into the left eye 2 (two) times daily.     ZIOPTAN 0.0015 % SOLN Place 1 drop into the left eye at bedtime.      Drug Regimen Review  Drug regimen was reviewed and remains appropriate with no significant issues identified  Home: Home Living Family/patient expects to be discharged to:: Private residence Living Arrangements: Spouse/significant other, Children Available Help at Discharge: Family, Available PRN/intermittently Type of Home: House Home Access: Stairs to enter [employer]  of Steps: 3-4 Entrance Stairs-Rails: Left Home Layout: Multi-level, Able to live on main level with bedroom/bathroom, Full bath on main level Bathroom Shower/Tub: Tub/shower unit, Walk-in shower Home Equipment: None Additional Comments: Lives with husband and son; husband works-- per PT eval   Functional History: Prior Function Level of Independence: Independent Comments: Independent without DME; retired Press photographer; drives; enjoys babysitting grandkids (29 and 51 y.o.), enjoys time with cat and dog, watching tv  Functional Status:  Mobility: Bed Mobility Overal bed mobility: Needs Assistance Bed Mobility: Rolling, Sidelying to Sit Rolling: Mod assist Sidelying to sit: Mod assist, +2 for safety/equipment Supine to sit: Total assist, +2 for physical assistance Sit to supine: Total assist, +2 for physical assistance Sit to sidelying: Max assist, +2 for physical assistance, +2 for safety/equipment General bed mobility comments: Pt able to roll R/L multiple times for repositioning and pericare, assisting well  with UE support on rails, requiring mod-maxA for BLE management and to complete pelvic rotation; modA for trunk elevation and scooting hips to EOB to sit Transfers Overall transfer level: Needs assistance Equipment used: 2 person hand held assist Transfer via Lift Equipment: Stedy Transfers: Public house manager Sit to Stand: Max assist, +2 physical assistance (+3 for posterior support) Stand pivot transfers: Total assist Squat pivot transfers: Max assist, +2 physical assistance  Lateral/Scoot Transfers: Max assist, +2 physical assistance, +2 safety/equipment General transfer comment: MaxA+2 for squat pivot transfer from bed to drop arm recliner, pt able to assist with bilateral HHA on therapists' arms, totalA for BLE positioning for transfer; pt declined standing trial from recliner secondary to fatigue Ambulation/Gait General Gait Details: Unable to assess    ADL: ADL Overall ADL's : Needs assistance/impaired Eating/Feeding: NPO Grooming: Minimal assistance, Sitting Grooming Details (indicate cue type and reason): min assist +2, unsupported sitting requires min-mod assist to maintain balance and comb hair with L hand Upper Body Dressing : Minimal assistance, Bed level Upper Body Dressing Details (indicate cue type and reason): able to thread BUEs into new gown with increased time Lower Body Dressing: Total assistance, +2 for physical assistance, +2 for safety/equipment, Sitting/lateral leans, Bed level Lower Body Dressing Details (indicate cue type and reason): assist for socks and shoes Toilet Transfer: Maximal assistance, +2 for physical assistance, +2 for safety/equipment, Squat-pivot Toilet Transfer Details (indicate cue type and reason): simulated to recliner Toileting- Clothing Manipulation and Hygiene: Total assistance, +2 for physical assistance, +2 for safety/equipment, Bed level Toileting - Clothing Manipulation Details (indicate cue type and reason): rolling in bed, total  assist Functional mobility during ADLs: Maximal assistance, +2 for physical assistance General ADL Comments: Focused session on sit<>stand at EOB and then participating in exercises for UEs.  Cognition: Cognition Overall Cognitive Status: Within Functional Limits for tasks assessed Orientation Level: Oriented X4 Cognition Arousal/Alertness: Awake/alert Behavior During Therapy: WFL for tasks assessed/performed Overall Cognitive Status: Within Functional Limits for tasks assessed General Comments: pt following commands well, cues at times for motivation as pt fatigues Difficult to assess due to: Tracheostomy   Blood pressure 104/60, pulse 90, temperature 97.7 F (36.5 C), temperature source Oral, resp. rate 20, height _0  (1.626 m), weight 61.6 kg, last menstrual period 04/20/2005, SpO2 96 %. Physical Exam Vitals reviewed.  Constitutional:      General: She is not in acute distress.    Appearance: She is normal weight. She is not ill-appearing.     Comments: + NG  HENT:     Head: Normocephalic and atraumatic.  Right Ear: External ear normal.     Left Ear: External ear normal.     Nose: Nose normal.  Eyes:     General:        Right eye: No discharge.        Left eye: No discharge.     Extraocular Movements: Extraocular movements intact.  Cardiovascular:     Rate and Rhythm: Normal rate and regular rhythm.  Pulmonary:     Effort: Pulmonary effort is normal. No respiratory distress.     Breath sounds: No stridor.  Abdominal:     General: Abdomen is flat.     Comments: Hypoactive bowel sounds  Musculoskeletal:     Cervical back: Normal range of motion and neck supple.     Comments: No edema or tenderness in extremities  Skin:    General: Skin is warm and dry.     Comments: Neck dressing CDI  Neurological:     Mental Status: She is alert.     Comments: Alert and oriented Motor: Bilateral upper extremities: 4+/5 proximal distal Bilateral lower extremities: Hip  flexion, knee extension 3/5, ankle dorsiflexion 3+/5 Sensation diminished to light touch distal to knees as well as fingertips  Psychiatric:        Mood and Affect: Mood normal.        Behavior: Behavior normal.   Results for orders placed or performed during the hospital encounter of 02/05/21 (from the past 48 hour(s))  Glucose, capillary     Status: Abnormal   Collection Time: 03/02/21 11:31 AM  Result Value Ref Range   Glucose-Capillary 117 (H) 70 - 99 mg/dL    Comment: Glucose reference range applies only to samples taken after fasting for at least 8 hours.  Glucose, capillary     Status: Abnormal   Collection Time: 03/02/21  4:38 PM  Result Value Ref Range   Glucose-Capillary 110 (H) 70 - 99 mg/dL    Comment: Glucose reference range applies only to samples taken after fasting for at least 8 hours.  Glucose, capillary     Status: Abnormal   Collection Time: 03/02/21  7:43 PM  Result Value Ref Range   Glucose-Capillary 111 (H) 70 - 99 mg/dL    Comment: Glucose reference range applies only to samples taken after fasting for at least 8 hours.  Glucose, capillary     Status: Abnormal   Collection Time: 03/02/21 11:37 PM  Result Value Ref Range   Glucose-Capillary 106 (H) 70 - 99 mg/dL    Comment: Glucose reference range applies only to samples taken after fasting for at least 8 hours.  Glucose, capillary     Status: Abnormal   Collection Time: 03/03/21  3:28 AM  Result Value Ref Range   Glucose-Capillary 110 (H) 70 - 99 mg/dL    Comment: Glucose reference range applies only to samples taken after fasting for at least 8 hours.  Glucose, capillary     Status: Abnormal   Collection Time: 03/03/21  6:39 AM  Result Value Ref Range   Glucose-Capillary 117 (H) 70 - 99 mg/dL    Comment: Glucose reference range applies only to samples taken after fasting for at least 8 hours.  Basic metabolic panel     Status: Abnormal   Collection Time: 03/03/21  7:04 AM  Result Value Ref Range    Sodium 130 (L) 135 - 145 mmol/L   Potassium 4.0 3.5 - 5.1 mmol/L   Chloride 97 (L) 98 - 111 mmol/L  CO2 26 22 - 32 mmol/L   Glucose, Bld 125 (H) 70 - 99 mg/dL    Comment: Glucose reference range applies only to samples taken after fasting for at least 8 hours.   BUN 15 8 - 23 mg/dL   Creatinine, Ser 0.41 (L) 0.44 - 1.00 mg/dL   Calcium 8.5 (L) 8.9 - 10.3 mg/dL   GFR, Estimated >60 >60 mL/min    Comment: (NOTE) Calculated using the CKD-EPI Creatinine Equation (2021)    Anion gap 7 5 - 15    Comment: Performed at [facility] [address], [place], [place] [address]  Glucose, capillary     Status: Abnormal   Collection Time: 03/03/21 11:47 AM  Result Value Ref Range   Glucose-Capillary 107 (H) 70 - 99 mg/dL    Comment: Glucose reference range applies only to samples taken after fasting for at least 8 hours.  Glucose, capillary     Status: Abnormal   Collection Time: 03/03/21  4:16 PM  Result Value Ref Range   Glucose-Capillary 119 (H) 70 - 99 mg/dL    Comment: Glucose reference range applies only to samples taken after fasting for at least 8 hours.  Glucose, capillary     Status: Abnormal   Collection Time: 03/03/21  7:24 PM  Result Value Ref Range   Glucose-Capillary 128 (H) 70 - 99 mg/dL    Comment: Glucose reference range applies only to samples taken after fasting for at least 8 hours.  Glucose, capillary     Status: None   Collection Time: 03/03/21 11:01 PM  Result Value Ref Range   Glucose-Capillary 91 70 - 99 mg/dL    Comment: Glucose reference range applies only to samples taken after fasting for at least 8 hours.  Glucose, capillary     Status: Abnormal   Collection Time: 03/04/21  3:40 AM  Result Value Ref Range   Glucose-Capillary 131 (H) 70 - 99 mg/dL    Comment: Glucose reference range applies only to samples taken after fasting for at least 8 hours.  Glucose, capillary     Status: None   Collection Time: 03/04/21  6:45 AM  Result Value Ref Range    Glucose-Capillary 97 70 - 99 mg/dL    Comment: Glucose reference range applies only to samples taken after fasting for at least 8 hours.   No results found.     Medical Problem List and Plan: 1.  Deficits with mobility, endurance, transfers, self-care secondary to AIDP.  -patient may shower with neck covered  -ELOS/Goals: 20-24 days/min a  Admit to CIR 2.  Antithrombotics: -DVT/anticoagulation:  Pharmaceutical: Other (comment)--Eliquis bid  -antiplatelet therapy: -- N/A 3. Scoliosis/Pain Management: Lidocaine patches. Gabapentin 200 mg/HS per Dr. Saintclair Halsted.    -low dose oxycodone prn  --Monitor with increased exertion 4. Mood:  LCSW to follow for evaluation and support.   -antipsychotic agents: N/A 5. Neuropsych: This patient is capable of making decisions on her own behalf. 6. Skin/Wound Care: Routine pressure relief measures.  7. Fluids/Electrolytes/Nutrition: Monitor I/Os--feels full "food being shoved at her all the time."  --CMP ordered for tomorrow. Continue K dur for now.  8. A fib: Monitor HR TID--amiodarone decreased to once daily on 09/06  --metoprolol d/c due to hypotension  Monitor with increased activity 9. Urinary retention: Schedule toileting--will monitor voiding with PVR checks.   --urecholine increased to 20 mg TID on 09/07-->monitor for orthostasis. 10. Dysphagia: Has resolved and tolerating regular diet but full due to ongoing  tube feeds  --will hold tube feeds --change to oral supplements. Start calorie count  11. Hypoxic hypercarbic respiratory failure: Continue aspiration precautions.  12. Hyponatremia: Na improving from nadir of 115-->127/130 for the past week.   --CMP ordered for tomorrow 13.  Sleep disturbance: Continue Seroquel and melatonin  --need to cautious re: uptiration-->hypotension  --Wean as tolerated 14. Hyperglycemia:  Hgb a1C-5.0-->likely due to tube feeds and supplement between meals.  ---will monitor CBG X 48 hours.   Bary Leriche,  PA-C 03/04/2021   I have personally performed a face to face diagnostic evaluation, including, but not limited to relevant history and physical exam findings, of this patient and developed relevant assessment and plan.  Additionally, I have reviewed and concur with the physician assistant's documentation above.  Delice Lesch, MD, ABPMR  The patient's status has not changed. Any changes from the pre-admission screening or documentation from the acute chart are noted above.   Delice Lesch, MD, ABPMR

## 2021-03-04 NOTE — Progress Notes (Signed)
RT assessed stoma post trach decannulation. Gauze was clean and dry. Pt is stable on RA. Rt will monitor.

## 2021-03-05 DIAGNOSIS — G610 Guillain-Barre syndrome: Principal | ICD-10-CM

## 2021-03-05 LAB — COMPREHENSIVE METABOLIC PANEL
ALT: 31 U/L (ref 0–44)
AST: 34 U/L (ref 15–41)
Albumin: 2.2 g/dL — ABNORMAL LOW (ref 3.5–5.0)
Alkaline Phosphatase: 94 U/L (ref 38–126)
Anion gap: 6 (ref 5–15)
BUN: 16 mg/dL (ref 8–23)
CO2: 27 mmol/L (ref 22–32)
Calcium: 8.5 mg/dL — ABNORMAL LOW (ref 8.9–10.3)
Chloride: 98 mmol/L (ref 98–111)
Creatinine, Ser: 0.4 mg/dL — ABNORMAL LOW (ref 0.44–1.00)
GFR, Estimated: >60 mL/min (ref >60)
Glucose, Bld: 97 mg/dL (ref 70–99)
Potassium: 3.7 mmol/L (ref 3.5–5.1)
Sodium: 131 mmol/L — ABNORMAL LOW (ref 135–145)
Total Bilirubin: 0.5 mg/dL (ref 0.3–1.2)
Total Protein: 6.6 g/dL (ref 6.5–8.1)

## 2021-03-05 LAB — GLUCOSE, CAPILLARY
Glucose-Capillary: 101 mg/dL — ABNORMAL HIGH (ref 70–99)
Glucose-Capillary: 102 mg/dL — ABNORMAL HIGH (ref 70–99)
Glucose-Capillary: 107 mg/dL — ABNORMAL HIGH (ref 70–99)
Glucose-Capillary: 90 mg/dL (ref 70–99)
Glucose-Capillary: 96 mg/dL (ref 70–99)

## 2021-03-05 LAB — CBC WITH DIFFERENTIAL/PLATELET
Abs Immature Granulocytes: 0.03 10*3/uL (ref 0.00–0.07)
Basophils Absolute: 0.1 10*3/uL (ref 0.0–0.1)
Basophils Relative: 2 %
Eosinophils Absolute: 0.1 10*3/uL (ref 0.0–0.5)
Eosinophils Relative: 2 %
HCT: 36.1 % (ref 36.0–46.0)
Hemoglobin: 11.8 g/dL — ABNORMAL LOW (ref 12.0–15.0)
Immature Granulocytes: 0 %
Lymphocytes Relative: 30 %
Lymphs Abs: 2.1 10*3/uL (ref 0.7–4.0)
MCH: 31.8 pg (ref 26.0–34.0)
MCHC: 32.7 g/dL (ref 30.0–36.0)
MCV: 97.3 fL (ref 80.0–100.0)
Monocytes Absolute: 1.2 10*3/uL — ABNORMAL HIGH (ref 0.1–1.0)
Monocytes Relative: 17 %
Neutro Abs: 3.4 10*3/uL (ref 1.7–7.7)
Neutrophils Relative %: 49 %
Platelets: 416 10*3/uL — ABNORMAL HIGH (ref 150–400)
RBC: 3.71 MIL/uL — ABNORMAL LOW (ref 3.87–5.11)
RDW: 14.6 % (ref 11.5–15.5)
WBC: 6.9 10*3/uL (ref 4.0–10.5)
nRBC: 0 % (ref 0.0–0.2)

## 2021-03-05 MED ORDER — TAMSULOSIN HCL 0.4 MG PO CAPS
0.4000 mg | ORAL_CAPSULE | Freq: Every day | ORAL | Status: DC
  Administered 2021-03-05 – 2021-03-09 (×5): 0.4 mg via ORAL
  Filled 2021-03-05 (×5): qty 1

## 2021-03-05 NOTE — Progress Notes (Signed)
PROGRESS NOTE   Subjective/Complaints: Pt reports got trach out 2 days ago- breathing well; Has NGT for TF's.   Poor sleep because bed got unplugged and when got uncomfortable, couldn't get nursing to move her.   Only able to do I/o caths- no voiding as of yet.  LBM yesterday.     ROS:  Pt denies SOB, abd pain, CP, N/V/C/D, and vision changes   Objective:   No results found. Recent Labs    03/05/21 0448  WBC 6.9  HGB 11.8*  HCT 36.1  PLT 416*   Recent Labs    03/03/21 0704 03/05/21 0448  NA 130* 131*  K 4.0 3.7  CL 97* 98  CO2 26 27  GLUCOSE 125* 97  BUN 15 16  CREATININE 0.41* 0.40*  CALCIUM 8.5* 8.5*    Intake/Output Summary (Last 24 hours) at 03/05/2021 1209 Last data filed at 03/04/2021 2233 Gross per 24 hour  Intake --  Output 800 ml  Net -800 ml        Physical Exam: Vital Signs Blood pressure 121/72, pulse 88, temperature 98 F (36.7 C), temperature source Oral, resp. rate 18, height 5' 4"  (1.626 m), weight 61.6 kg, last menstrual period 04/20/2005, SpO2 97 %.    General: awake, alert, appropriate, laying supine in bed; NAD HENT: conjugate gaze; oropharynx moist  NGT in nare; also has trach site covered and no air leaks ZH:GDJMEQ tachycardic;; no JVD Pulmonary: CTA B/L; no W/R/R- good air movement-decreased slightly at bases B/L  GI: soft, NT, ND, (+)BS Psychiatric: appropriate; slightly flat Neurological: alert Musculoskeletal:     Cervical back: Normal range of motion and neck supple.     Comments: No edema or tenderness in extremities  Skin:    General: Skin is warm and dry.     Comments: Neck dressing CDI  Neurological:     Mental Status: She is alert.     Comments: Alert and oriented Motor: Bilateral upper extremities: 4+/5 proximal distal Bilateral lower extremities: Hip flexion, knee extension 3/5, ankle dorsiflexion 3+/5 Sensation diminished to light touch distal to knees  as well as fingertips    Assessment/Plan: 1. Functional deficits which require 3+ hours per day of interdisciplinary therapy in a comprehensive inpatient rehab setting. Physiatrist is providing close team supervision and 24 hour management of active medical problems listed below. Physiatrist and rehab team continue to assess barriers to discharge/monitor patient progress toward functional and medical goals  Care Tool:  Bathing              Bathing assist       Upper Body Dressing/Undressing Upper body dressing   What is the patient wearing?: Hospital gown only    Upper body assist Assist Level: Minimal Assistance - Patient > 75%    Lower Body Dressing/Undressing Lower body dressing            Lower body assist       Toileting Toileting    Toileting assist Assist for toileting: 2 Helpers     Transfers Chair/bed transfer  Transfers assist     Chair/bed transfer assist level: Total Assistance - Patient < 25%  Locomotion Ambulation   Ambulation assist              Walk 10 feet activity   Assist           Walk 50 feet activity   Assist           Walk 150 feet activity   Assist           Walk 10 feet on uneven surface  activity   Assist           Wheelchair     Assist               Wheelchair 50 feet with 2 turns activity    Assist            Wheelchair 150 feet activity     Assist          Blood pressure 121/72, pulse 88, temperature 98 F (36.7 C), temperature source Oral, resp. rate 18, height 5' 4"  (1.626 m), weight 61.6 kg, last menstrual period 04/20/2005, SpO2 97 %.      Medical Problem List and Plan: 1.  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              -patient may shower with neck covered             -ELOS/Goals: 20-24 days/min a             first day of PT/OT evaluations- con't PT and OT 2.   Antithrombotics: -DVT/anticoagulation:  Pharmaceutical: Other (comment)--Eliquis bid             -antiplatelet therapy: -- N/A 3. Scoliosis/Pain Management: Lidocaine patches. Gabapentin 200 mg/HS per Dr. Saintclair Halsted.               -low dose oxycodone prn  9/8- pain increased last night, but fixed bed issue- con't regimen             --Monitor with increased exertion 4. Mood:  LCSW to follow for evaluation and support.              -antipsychotic agents: N/A 5. Neuropsych: This patient is capable of making decisions on her own behalf. 6. Skin/Wound Care: Routine pressure relief measures.  7. Fluids/Electrolytes/Nutrition: Monitor I/Os--feels full "food being shoved at her all the time."  -- Continue K dur for now.  8. A fib: Monitor HR TID--amiodarone decreased to once daily on 09/06             --metoprolol d/c due to hypotension             Monitor with increased activity 9. Urinary retention: Schedule toileting--will monitor voiding with PVR checks.              --urecholine increased to 20 mg TID on 09/07-->monitor for orthostasis 9/8- will start Flomax 0.4 mg QHS- not allergic to Sulfa. . 10. Dysphagia: Has resolved and tolerating regular diet but full due to ongoing tube feeds             --will hold tube feeds --change to oral supplements. Start calorie count  9/8- on calorie count- will d/w dietician if needs to con't the feeding tube or if she's eating enough.              11. Hypoxic hypercarbic respiratory failure: Continue aspiration precautions.   9/8- trach out 2 days ago- trach site healing- con't to monitor 12. Hyponatremia: Na improving from  nadir of 115-->127/130 for the past week.              --9/8- Na up to 131- will con't to monitor 13.  Sleep disturbance: Continue Seroquel and melatonin             --need to cautious re: uptiration-->hypotension  --Wean as tolerated 14. Hyperglycemia:  Hgb a1C-5.0-->likely due to tube feeds and supplement between meals.              ---will monitor CBG X 48 hours.     LOS: 1 days A FACE TO FACE EVALUATION WAS PERFORMED  Angela Morales 03/05/2021, 12:09 PM

## 2021-03-05 NOTE — Progress Notes (Signed)
Inpatient Rehabilitation  Patient information reviewed and entered into eRehab system by Launa Goedken M. Giulian Goldring, M.A., CCC/SLP, PPS Coordinator.  Information including medical coding, functional ability and quality indicators will be reviewed and updated through discharge.    

## 2021-03-05 NOTE — Progress Notes (Signed)
Inpatient Rehabilitation Care Coordinator Assessment and Plan Patient Details  Name: Angela Morales MRN: [id number] Date of Birth: 03-10-1946  Today's Date: 03/05/2021  Hospital Problems: Principal Problem:   GBS (Guillain-Barre syndrome) ([place])  Past Medical History:  Past Medical History:  Diagnosis Date   Abnormal uterine bleeding    on HRT   Allergy    seasonal   Arthritis    Atrial fibrillation (HCC)    Celiac disease    DDD (degenerative disc disease)    Dysrhythmia    Glaucoma    Microscopic colitis    Osteoarthritis of both knees    PONV (postoperative nausea and vomiting)    SVT (supraventricular tachycardia) ([place])    Past Surgical History:  Past Surgical History:  Procedure Laterality Date   APPENDECTOMY     ATRIAL FIBRILLATION ABLATION N/A 01/28/2021   Procedure: ATRIAL FIBRILLATION ABLATION;  Surgeon: Constance Haw, MD;  Location: [facility];  Service: Cardiovascular;  Laterality: N/A;   CARDIOVERSION N/A 10/30/2020   Procedure: CARDIOVERSION;  Surgeon: Jerline Pain, MD;  Location: [facility] ENDOSCOPY;  Service: Cardiovascular;  Laterality: N/A;   COLONOSCOPY     FOOT FRACTURE SURGERY  10/2013   TONSILLECTOMY AND ADENOIDECTOMY     TOTAL KNEE ARTHROPLASTY Left 03/22/2016   Procedure: TOTAL KNEE ARTHROPLASTY;  Surgeon: Dorna Leitz, MD;  Location: [facility];  Service: Orthopedics;  Laterality: Left;   TUBAL LIGATION  1981   VAGINAL HYSTERECTOMY  04/20/05   prolapse, adenomyosis   Social History:  reports that she has never smoked. She has never used smokeless tobacco. She reports current alcohol use of about 4.0 - 6.0 standard drinks per week. She reports that she does not use drugs.  Family / Support Systems Marital Status: Married How Long?: 41 years Patient Roles: Spouse, Parent Spouse/Significant Other: Angela Morales (husband): [phone] Children: 2 adult: Angela Morales- lives in [place] with family and Angela Morales-lives in the home but intermittent help since he  works Other Supports: None reported Anticipated Caregiver: Husband Ability/Limitations of Caregiver: Pt husband works from home as an Dentist: Intermittent Family Dynamics: Pt lives with her husband, and their adult son lives in the home as well.  Social History Preferred language: English Religion: Christian Cultural Background: Pt worked as a Designer, jewellery for 30 years Education: Publishing rights manager - How often do you need to have someone help you when you read instructions, pamphlets, or other written material from your doctor or pharmacy?: Never Writes: Yes Employment Status: Retired Date Retired/Disabled/Unemployed: 2012 Age Retired: 65 Public relations account executive Issues: Denies Guardian/Conservator: N/A   Abuse/Neglect Abuse/Neglect Assessment Can Be Completed: Yes Physical Abuse: Denies Verbal Abuse: Denies Sexual Abuse: Denies Exploitation of patient/patient's resources: Denies Self-Neglect: Denies  Patient response to: Social Isolation - How often do you feel lonely or isolated from those around you?: Never  Emotional Status Pt's affect, behavior and adjustment status: Pt in good spirits at time of visit Recent Psychosocial Issues: Denies Psychiatric History: Denies Substance Abuse History: Pt admits to glass of wine alomst nightly with dinner. Declines any other uses.  Patient / Family Perceptions, Expectations & Goals Pt/Family understanding of illness & functional limitations: Pt has a general understanding of care needs Premorbid pt/family roles/activities: INdependent Anticipated changes in roles/activities/participation: Assistance with ADLs/IADLs Pt/family expectations/goals: Pt goal is to work on "mobility and getting around..wish I could feel my legs."  [employer]: None Premorbid Home Care/DME Agencies: None Transportation available at discharge: Husband Is the patient  able to respond  to transportation needs?: Yes In the past 12 months, has lack of transportation kept you from medical appointments or from getting medications?: No In the past 12 months, has lack of transportation kept you from meetings, work, or from getting things needed for daily living?: No Resource referrals recommended: Neuropsychology  Discharge Planning Living Arrangements: Spouse/significant other, Children Support Systems: Spouse/significant other, Children Type of Residence: Private residence Insurance Resources: Multimedia programmer (specify) ([place] Medicare) Financial Resources: Social Security, Other (Comment) (savings/retirement) Financial Screen Referred: No Living Expenses: Own Money Management: Patient Does the patient have any problems obtaining your medications?: No Home Management: Pt managed all homecare needs Patient/Family Preliminary Plans: TBD Care Coordinator Barriers to Discharge: Decreased caregiver support, Lack of/limited family support Care Coordinator Anticipated Follow Up Needs: HH/OP  Clinical Impression SW met with pt in room to introduce self, explain role, and discuss discharge process. Pt is not veteran. HCPOA is her husband Angela Morales [phone]). DME: rw, cane, ?3in1 BSC (items from knee surgery 7 yrs ago). PT aware SW will follow-up with her husband.   1120-SW left message for pt husband Angela Morales to introduce self, explain role, and discuss discharge process.   Agape Hardiman A Jahmiya Guidotti 03/05/2021, 11:40 AM

## 2021-03-05 NOTE — Evaluation (Signed)
Physical Therapy Assessment and Plan  Patient Details  Name: Angela Morales MRN: [id number] Date of Birth: May 02, 1946  PT Diagnosis: Abnormal posture, Coordination disorder, Difficulty walking, Hypotonia, Impaired sensation, Low back pain, and Muscle weakness Rehab Potential: Good ELOS: 4 weeks   Today's Date: 03/05/2021 PT Individual Time: 1100-1200 PT Individual Time Calculation (min): 60 min    Hospital Problem: Principal Problem:   GBS (Guillain-Barre syndrome) ([place])   Past Medical History:  Past Medical History:  Diagnosis Date   Abnormal uterine bleeding    on HRT   Allergy    seasonal   Arthritis    Atrial fibrillation (HCC)    Celiac disease    DDD (degenerative disc disease)    Dysrhythmia    Glaucoma    Microscopic colitis    Osteoarthritis of both knees    PONV (postoperative nausea and vomiting)    SVT (supraventricular tachycardia) ([place])    Past Surgical History:  Past Surgical History:  Procedure Laterality Date   APPENDECTOMY     ATRIAL FIBRILLATION ABLATION N/A 01/28/2021   Procedure: ATRIAL FIBRILLATION ABLATION;  Surgeon: Constance Haw, MD;  Location: [facility];  Service: Cardiovascular;  Laterality: N/A;   CARDIOVERSION N/A 10/30/2020   Procedure: CARDIOVERSION;  Surgeon: Jerline Pain, MD;  Location: [facility] ENDOSCOPY;  Service: Cardiovascular;  Laterality: N/A;   COLONOSCOPY     FOOT FRACTURE SURGERY  10/2013   TONSILLECTOMY AND ADENOIDECTOMY     TOTAL KNEE ARTHROPLASTY Left 03/22/2016   Procedure: TOTAL KNEE ARTHROPLASTY;  Surgeon: Dorna Leitz, MD;  Location: [facility];  Service: Orthopedics;  Laterality: Left;   TUBAL LIGATION  1981   VAGINAL HYSTERECTOMY  04/20/05   prolapse, adenomyosis    Assessment & Plan Clinical Impression: Angela Morales is a 75 year old LH-female with history of A fib s/p recent ablation, severe lumbar scoliosis (not amenable to surgery), celiac disease, SVT who was admitted on 02/06/21 with 4-day history of  tingling in hand and feet progressing to diffuse weakness with numbness (R) > (L) and inabilty to walk.  History taken from chart review, patient, and family.  Neurology consulted and she was started on IVIG for AIDP and eliquis transitioned to heparin due to plans for LP. She continued to worsen with increase WOB, concerns of aspiration event with supper and bouts of confusion with agitation felt to be due to hypoxic hypercarbic respiratory failure. She was intubated on 02/08/2021 and treated with course of Zosyn.  She has progressive hyponatremia, felt to be expected in setting of GBS and drop to 115 this was treated with hypertonic saline. She had issues with chest discomfort due to atrial fibrillation with RVR requiring IV diltiazem as well as amiodarone infusion.    Dr. Ellis Savage consulted for input on acute dysphagia and felt that symptoms due to GBS-->tube feeds recommended for nutritional support. CTA chest negative for PE and showed GB distension with faint pericholecystic stranding --> question early or developing acute cholecystitis and large air filled duodenal diverticulum without focal infection and segmental thickening of sigmoid colon question prior inflammation or chronic smoldering inflammation.  Follow up GB ultrasound showed distended GB without sones, pericholecystic fluid and no biliary dilatation. She underwent trach placement on 02/10/2021 by Dr. Tamala Julian and had brief episode of unresponsiveness. CT head unremarkable for acute intracranial process. She briefly needed Neo-synephrine due to hypotension secondary to BB and Seroquel.  She developed leukocytosis with malaise on 02/19/2021 and was treated with 5 day course  of Cefazolin for BAL positive for Staph/Kleb.      MBS done without evidence of dysphagia and was started on regular diet with tube feeds ongoing. She was slowly weaned to ATC and decannulatted by 09/06. She continues to be limited by diffuse weakness, balance deficits, fatigue  and working on balance at EOB. CIR recommended due to functional decline.  Please see preadmission assessment from earlier today as well.  Patient currently requires total with mobility secondary to muscle weakness and muscle paralysis and abnormal tone, ataxia, decreased coordination, and decreased motor planning.  Prior to hospitalization, patient was independent  with mobility and lived with Spouse in a House home.  Home access is 3-4Stairs to enter.  Patient will benefit from skilled PT intervention to maximize safe functional mobility, minimize fall risk, and decrease caregiver burden for planned discharge home with intermittent assist.  Anticipate patient will benefit from follow up [facility] at discharge.  PT - End of Session Activity Tolerance: Tolerates 10 - 20 min activity with multiple rests Endurance Deficit: Yes PT Assessment Rehab Potential (ACUTE/IP ONLY): Good PT Barriers to Discharge: Inaccessible home environment PT Patient demonstrates impairments in the following area(s): Balance;Safety;Sensory;Endurance;Motor;Pain PT Transfers Functional Problem(s): Bed Mobility;Bed to Chair;Car;Furniture PT Locomotion Functional Problem(s): Wheelchair Mobility;Stairs PT Plan PT Intensity: Minimum of 1-2 x/day ,45 to 90 minutes PT Frequency: 5 out of 7 days PT Duration Estimated Length of Stay: 4 weeks PT Treatment/Interventions: Neuromuscular re-education;Stair training;UE/LE Strength taining/ROM;Wheelchair propulsion/positioning;Ambulation/gait training;Balance/vestibular training;Discharge planning;Therapeutic Activities;UE/LE Coordination activities;Functional mobility training;Patient/family education;Therapeutic Exercise PT Transfers Anticipated Outcome(s): min/CGA PT Locomotion Anticipated Outcome(s): min A gait, w/c supervision. PT Recommendation Recommendations for Other Services: Neuropsych consult;Therapeutic Recreation consult Follow Up Recommendations: Home health PT Patient  destination: Home Equipment Recommended: To be determined   PT Evaluation Precautions/Restrictions Precautions Precautions: Fall;Other (comment) Precaution Comments: cortrak; bladder/bowel incontinence, log roll for back pain even PTA Required Braces or Orthoses: Other Brace Other Brace: bilateral ankle air splints Restrictions Weight Bearing Restrictions: No General Chart Reviewed: Yes Family/Caregiver Present: No Vital Signs  Pain Pain Assessment Pain Scale: 0-10 Pain Score: 7  Pain Type: Chronic pain Pain Location: Back Pain Orientation: Lower Pain Descriptors / Indicators: Aching;Sharp Pain Onset: With Activity Patients Stated Pain Goal: 0 Pain Intervention(s): RN made aware Pain Interference Pain Interference Pain Effect on Sleep: 0. Does not apply - I have not had any pain or hurting in the past 5 days Pain Interference with Therapy Activities: 2. Occasionally (states abbreviated sessions 2/2 endurance > pain.) Pain Interference with Day-to-Day Activities: 1. Rarely or not at all Home Living/Prior [place]: Spouse/significant other;Children Available Help at Discharge: Family;Available PRN/intermittently Type of Home: House Home Access: Stairs to enter [employer] of Steps: 3-4 Entrance Stairs-Rails: Left Home Layout: Multi-level;Able to live on main level with bedroom/bathroom;Full bath on main level  Lives With: Spouse Prior Function Level of Independence: Independent with transfers;Independent with gait;Independent with homemaking with ambulation  Able to Take Stairs?: Yes Driving: Yes Vision/Perception     Cognition Overall Cognitive Status: Within Functional Limits for tasks assessed Arousal/Alertness: Awake/alert Orientation Level: Oriented X4 Attention: Focused Focused Attention: Appears intact Memory: Appears intact Sensation Sensation Light Touch: Impaired Detail Light Touch Impaired Details: Absent  LLE;Absent RLE (knee and distal.) Proprioception Impaired Details: Absent LLE;Absent RLE Coordination Gross Motor Movements are Fluid and Coordinated: No Fine Motor Movements are Fluid and Coordinated: No Heel Shin Test: unable to perform Motor  Motor Motor: Abnormal postural alignment and control;Other (comment) Motor - Skilled Clinical  Observations: decreased LEs   Trunk/Postural Assessment  Cervical Assessment Cervical Assessment: Within Functional Limits Thoracic Assessment Thoracic Assessment: Exceptions to WFL (kyphotic) Postural Control Postural Control: Deficits on evaluation Trunk Control: requires UE support, able to maintain w/o UE support x 30 seconds  Balance Balance Balance Assessed: Yes Static Sitting Balance Static Sitting - Balance Support: Bilateral upper extremity supported;Feet supported Static Sitting - Level of Assistance: 4: Min assist Dynamic Sitting Balance Sitting balance - Comments: Able to maintain static sitting EOB with BUE support and min guard, w/o UE support x 30 seconds. Static Standing Balance Static Standing - Balance Support: Bilateral upper extremity supported Static Standing - Level of Assistance: 1: +2 Total assist Extremity Assessment      RLE Assessment RLE Assessment: Exceptions to [facility] RLE Strength RLE Overall Strength: Deficits RLE Overall Strength Comments: hip grossly trace, knee extension 3-/5, Right Ankle Dorsiflexion: 2-/5 LLE Assessment LLE Assessment: Exceptions to [facility] General Strength Comments: hip trace, knee extension 2+/5, as well as dorsiflexion  Care Tool Care Tool Bed Mobility Roll left and right activity   Roll left and right assist level: Moderate Assistance - Patient 50 - 74%    Sit to lying activity   Sit to lying assist level: Moderate Assistance - Patient 50 - 74%    Lying to sitting on side of bed activity   Lying to sitting on side of bed assist level: the ability to move from lying on the back to  sitting on the side of the bed with no back support.: Maximal Assistance - Patient 25 - 49%     Care Tool Transfers Sit to stand transfer   Sit to stand assist level: 2 Helpers    Chair/bed transfer   Chair/bed transfer assist level: Total Assistance - Patient < 25%     Psychologist, counselling transfer activity did not occur: Safety/medical concerns        Care Tool Locomotion Ambulation Ambulation activity did not occur: Safety/medical concerns        Walk 10 feet activity Walk 10 feet activity did not occur: Safety/medical concerns       Walk 50 feet with 2 turns activity Walk 50 feet with 2 turns activity did not occur: Safety/medical concerns      Walk 150 feet activity Walk 150 feet activity did not occur: Safety/medical concerns      Walk 10 feet on uneven surfaces activity Walk 10 feet on uneven surfaces activity did not occur: Safety/medical concerns      Stairs Stair activity did not occur: Safety/medical concerns        Walk up/down 1 step activity Walk up/down 1 step or curb (drop down) activity did not occur: Safety/medical concerns     Walk up/down 4 steps activity did not occuR: Safety/medical concerns  Walk up/down 4 steps activity      Walk up/down 12 steps activity Walk up/down 12 steps activity did not occur: Safety/medical concerns      Pick up small objects from floor Pick up small object from the floor (from standing position) activity did not occur: Safety/medical concerns      Wheelchair Is the patient using a wheelchair?: Yes Type of Wheelchair: Manual Wheelchair activity did not occur: Safety/medical concerns      Wheel 50 feet with 2 turns activity Wheelchair 50 feet with 2 turns activity did not occur: Safety/medical concerns    Wheel 150 feet activity Wheelchair 150 feet  activity did not occur: Safety/medical concerns      Refer to Care Plan for Long Term Goals  SHORT TERM GOAL WEEK 1 PT Short Term Goal 1  (Week 1): Pt will transfer sidelying to sit w/ min A. PT Short Term Goal 2 (Week 1): Pt will transfer sit to stand w/ max A PT Short Term Goal 3 (Week 1): Pt will negotiate w/c x 30' w/ min A. PT Short Term Goal 4 (Week 1): Pt will transfer bed<> w/cc w/ SB and mod A  Recommendations for other services: Neuropsych and Therapeutic Recreation  Outing/community reintegration  Skilled Therapeutic Intervention Evaluation completed (see details above and below) with education on PT POC and goals and individual treatment initiated with focus on endurance, neuro re-ed, strengthening, transfers.  Pt presents semi-reclined in bed at 44 degrees and agreeable to therapy.  Pt required total A to don air casts to B LEs and shoes.  Pt required mod A to attain hooklying position and then mod A for rolling to R.  Pt required mod A for transferring to sitting EOB.  Pt requires UE support and min A to scoot to EOB.  Pt able to maintain sitting balance w/ hands on knees for 30 seconds, but slumped posture.  Pt transfers bed <> w/c w/ assist of 2 w/ SB and assist to re-position feet and facilitation for forard lean.  Pt able to lean away to place SB, but requires total A to place and lift RLE.  Pt transfers sit to stand w/ max A blocking L knee, and flexed osture, but able to increase knee extension w/ cueing for short period of time.  Pt unable to perform SPT even w/ A + 2, so sat and performed SB transfer as above.  Pt required max A for sit to R sidelying for Les and verbal cues for use of siderail.  Pt assisted to position in center of bed.  PRAFOs donned and then handed off to NT for bladder scanning.  NT will request pain meds from nurse.    Mobility Bed Mobility Bed Mobility: Rolling Right;Right Sidelying to Sit;Sit to Sidelying Right Rolling Right: Moderate Assistance - Patient 50-74% Right Sidelying to Sit: Moderate Assistance - Patient 50-74% Sit to Sidelying Right: Total Assistance - Patient < 25%;Maximal  Assistance - Patient 25-49% Transfers Transfers: Lateral/Scoot Transfers;Sit to Stand Sit to Stand: 2 Helpers Lateral/Scoot Transfers: 2 Press photographer (Assistive device): Other (Comment) (slide board.) Locomotion  Gait Ambulation: No Gait Gait: No Wheelchair Mobility Wheelchair Mobility: No   Discharge Criteria: Patient will be discharged from PT if patient refuses treatment 3 consecutive times without medical reason, if treatment goals not met, if there is a change in medical status, if patient makes no progress towards goals or if patient is discharged from hospital.  The above assessment, treatment plan, treatment alternatives and goals were discussed and mutually agreed upon: by patient  Ladoris Gene 03/05/2021, 12:54 PM

## 2021-03-05 NOTE — Evaluation (Signed)
Speech Language Pathology Assessment and Plan  Patient Details  Name: Angela Morales MRN: [id number] Date of Birth: 05-31-1946  SLP Diagnosis: Speech and Language deficits  Rehab Potential:  N/A ELOS:   N/A   Today's Date: 03/05/2021 SLP Individual Time: 1300-1330 SLP Individual Time Calculation (min): 30 min   Hospital Problem: Principal Problem:   GBS (Guillain-Barre syndrome) ([place])  Past Medical History:  Past Medical History:  Diagnosis Date   Abnormal uterine bleeding    on HRT   Allergy    seasonal   Arthritis    Atrial fibrillation (HCC)    Celiac disease    DDD (degenerative disc disease)    Dysrhythmia    Glaucoma    Microscopic colitis    Osteoarthritis of both knees    PONV (postoperative nausea and vomiting)    SVT (supraventricular tachycardia) ([place])    Past Surgical History:  Past Surgical History:  Procedure Laterality Date   APPENDECTOMY     ATRIAL FIBRILLATION ABLATION N/A 01/28/2021   Procedure: ATRIAL FIBRILLATION ABLATION;  Surgeon: Constance Haw, MD;  Location: [facility];  Service: Cardiovascular;  Laterality: N/A;   CARDIOVERSION N/A 10/30/2020   Procedure: CARDIOVERSION;  Surgeon: Jerline Pain, MD;  Location: [facility] ENDOSCOPY;  Service: Cardiovascular;  Laterality: N/A;   COLONOSCOPY     FOOT FRACTURE SURGERY  10/2013   TONSILLECTOMY AND ADENOIDECTOMY     TOTAL KNEE ARTHROPLASTY Left 03/22/2016   Procedure: TOTAL KNEE ARTHROPLASTY;  Surgeon: Dorna Leitz, MD;  Location: [place];  Service: Orthopedics;  Laterality: Left;   TUBAL LIGATION  1981   VAGINAL HYSTERECTOMY  04/20/05   prolapse, adenomyosis    Assessment / Plan / Recommendation  Angela Morales is a 75 year old LH-female with history of A fib s/p recent ablation, severe lumbar scoliosis (not amenable to surgery), celiac disease, SVT who was admitted on 02/06/21 with 4-day history of tingling in hand and feet progressing to diffuse weakness with numbness (R) > (L) and  inabilty to walk.  History taken from chart review, patient, and family.  Neurology consulted and she was started on IVIG for AIDP and eliquis transitioned to heparin due to plans for LP. She continued to worsen with increase WOB, concerns of aspiration event with supper and bouts of confusion with agitation felt to be due to hypoxic hypercarbic respiratory failure. She was intubated on 02/08/2021 and treated with course of Zosyn.  She has progressive hyponatremia, felt to be expected in setting of GBS and drop to 115 this was treated with hypertonic saline. She had issues with chest discomfort due to atrial fibrillation with RVR requiring IV diltiazem as well as amiodarone infusion.    Dr. Ellis Savage consulted for input on acute dysphagia and felt that symptoms due to GBS-->tube feeds recommended for nutritional support. CTA chest negative for PE and showed GB distension with faint pericholecystic stranding --> question early or developing acute cholecystitis and large air filled duodenal diverticulum without focal infection and segmental thickening of sigmoid colon question prior inflammation or chronic smoldering inflammation.  Follow up GB ultrasound showed distended GB without sones, pericholecystic fluid and no biliary dilatation. She underwent trach placement on 02/10/2021 by Dr. Tamala Julian and had brief episode of unresponsiveness. CT head unremarkable for acute intracranial process. She briefly needed Neo-synephrine due to hypotension secondary to BB and Seroquel.  She developed leukocytosis with malaise on 02/19/2021 and was treated with 5 day course of Cefazolin for BAL positive for Staph/Kleb.  MBS done without evidence of dysphagia and was started on regular diet with tube feeds ongoing. She was slowly weaned to ATC and decannulatted by 09/06. She continues to be limited by diffuse weakness, balance deficits, fatigue and working on balance at EOB. CIR recommended due to functional decline.  Clinical  Impression Patient presents with cognitive-linguistic and speech abilities all WNL. She is 2 days post decannulation and is achieving strong, clear voicing without having to place pressure on stoma dressing. Patient reports her voice seems back to normal. SLP did not evaluated swallow function as patient was found to have normal oral and pharyngeal swallow function per MBS on 9/2. Barium tablet did become lodged briefly in vallecular sinus and then esophagus but with SLP strongly suspecting Coretrak tube was causing this and not a dysphagia. Acute SLP had been indicating plan for RMST however this was to aid in decannulation. In addition patient would not be a candidate for RMST at this time as her stoma is still healing. SLP encouraged patient and son to let therapy staff know if she is experiencing any swallow or voice issues during her CIR stay. SLP is not recommending further treatment at this time.  Skilled Therapeutic Interventions          SLE  SLP Assessment  Patient does not need any further Speech [place] [facility]    Recommendations  Patient destination: Home Follow up Recommendations: None Equipment Recommended: None recommended by SLP    SLP Frequency         N/A     SLP Duration            N/A  SLP Intensity    N/A  SLP Treatment/Interventions     N/A            Pain Pain Assessment Pain Scale: 0-10 Pain Score: 0-No pain Faces Pain Scale: No hurt Pain Type: Chronic pain Pain Location: Back Pain Orientation: Lower Pain Descriptors / Indicators: Aching;Sharp Pain Onset: With Activity Patients Stated Pain Goal: 0 Pain Intervention(s): RN made aware PAINAD (Pain Assessment in Advanced Dementia) Breathing: normal Negative Vocalization: none Facial Expression: smiling or inexpressive Body Language: relaxed Consolability: no need to console PAINAD Score: 0  Prior Functioning Cognitive/Linguistic Baseline: Within functional limits Type of Home: House   Lives With: Spouse Available Help at Discharge: Family;Available PRN/intermittently Education: masters in Nursing Vocation: Retired (retired pediatric NP)  SLP Evaluation Cognition Overall Cognitive Status: Within Functional Limits for tasks assessed Arousal/Alertness: Awake/alert Orientation Level: Oriented X4 Year: 2022 Month: September Day of Week: Correct Attention: Sustained;Selective Focused Attention: Appears intact Sustained Attention: Appears intact Selective Attention: Appears intact Memory: Appears intact Immediate Memory Recall: Sock;Blue;Bed Memory Recall Sock: Without Cue Memory Recall Blue: Without Cue Memory Recall Bed: Without Cue Awareness: Appears intact Problem Solving: Appears intact Safety/Judgment: Appears intact  Comprehension Auditory Comprehension Overall Auditory Comprehension: Appears within functional limits for tasks assessed Expression Expression Primary Mode of Expression: Verbal Verbal Expression Overall Verbal Expression: Appears within functional limits for tasks assessed Oral Motor Oral Motor/Sensory Function Overall Oral Motor/Sensory Function: Within functional limits Motor Speech Overall Motor Speech: Appears within functional limits for tasks assessed Respiration: Within functional limits Resonance: Within functional limits Articulation: Within functional limitis Intelligibility: Intelligible  Care Tool Care Tool Cognition Ability to hear (with hearing aid or hearing appliances if normally used Ability to hear (with hearing aid or hearing appliances if normally used): 0. Adequate - no difficulty in normal conservation, social interaction, listening to TV  Expression of Ideas and Wants Expression of Ideas and Wants: 4. Without difficulty (complex and basic) - expresses complex messages without difficulty and with speech that is clear and easy to understand   Understanding Verbal and Non-Verbal Content Understanding Verbal and  Non-Verbal Content: 4. Understands (complex and basic) - clear comprehension without cues or repetitions  Memory/Recall Ability Memory/Recall Ability : Current season;Location of own room;That he or she is in a hospital/hospital unit  Short Term Goals: No short term goals set  Refer to Care Plan for Long Term Goals  Recommendations for other services: None   Discharge Criteria: Patient will be discharged from SLP if patient refuses treatment 3 consecutive times without medical reason, if treatment goals not met, if there is a change in medical status, if patient makes no progress towards goals or if patient is discharged from hospital.  The above assessment, treatment plan, treatment alternatives and goals were discussed and mutually agreed upon: by patient and by family  Sonia Baller, MA, CCC-SLP Speech Therapy

## 2021-03-05 NOTE — Evaluation (Signed)
Occupational Therapy Assessment and Plan  Patient Details  Name: Angela Morales MRN: [id number] Date of Birth: 07-13-1945  OT Diagnosis: abnormal posture, cognitive deficits, lumbago (low back pain), and muscle weakness (generalized) Rehab Potential: Rehab Potential (ACUTE ONLY): Good ELOS: 76 weeks   Today's Date: 03/05/2021 OT Individual Time: 800-915 76 min  Hospital Problem: Principal Problem:   GBS (Guillain-Barre syndrome) ([place])   Past Medical History:  Past Medical History:  Diagnosis Date   Abnormal uterine bleeding    on HRT   Allergy    seasonal   Arthritis    Atrial fibrillation (HCC)    Celiac disease    DDD (degenerative disc disease)    Dysrhythmia    Glaucoma    Microscopic colitis    Osteoarthritis of both knees    PONV (postoperative nausea and vomiting)    SVT (supraventricular tachycardia) (HCC)    Past Surgical History:  Past Surgical History:  Procedure Laterality Date   APPENDECTOMY     ATRIAL FIBRILLATION ABLATION N/A 01/28/2021   Procedure: ATRIAL FIBRILLATION ABLATION;  Surgeon: Constance Haw, MD;  Location: [facility];  Service: Cardiovascular;  Laterality: N/A;   CARDIOVERSION N/A 10/30/2020   Procedure: CARDIOVERSION;  Surgeon: Jerline Pain, MD;  Location: [facility] ENDOSCOPY;  Service: Cardiovascular;  Laterality: N/A;   COLONOSCOPY     FOOT FRACTURE SURGERY  10/2013   TONSILLECTOMY AND ADENOIDECTOMY     TOTAL KNEE ARTHROPLASTY Left 03/22/2016   Procedure: TOTAL KNEE ARTHROPLASTY;  Surgeon: Dorna Leitz, MD;  Location: [place];  Service: Orthopedics;  Laterality: Left;   TUBAL LIGATION  1981   VAGINAL HYSTERECTOMY  04/20/05   prolapse, adenomyosis    Assessment & Plan Clinical Impression: Pt is a 75 y.o. female admitted 02/05/21 with c/o progressive BUE/BLE numbness and weakness; diagnosed with Guillain-Barr syndrome. Pt also with afib with RVR. Intubated 8/14 due to acute respiratory failure with hypoxia w/ aspiration PNA and  bibasilar atelectasis. Brain MRI negative 8/15. Trach placed 8/16. Trach decannunlated 9/6. PMH includes afib (recent ablation 01/28/21), celiac disease, DDD, gluacoma, and L TKA (2017).   Patient currently requires total with basic self-care skills secondary to muscle weakness, decreased cardiorespiratoy endurance, impaired timing and sequencing, unbalanced muscle activation, ataxia, and decreased coordination, and decreased sitting balance, decreased standing balance, decreased postural control, and decreased balance strategies.  Prior to hospitalization, patient could complete BADL/IADL with independent .  Patient will benefit from skilled intervention to decrease level of assist with basic self-care skills prior to discharge home with care partner.  Anticipate patient will require 24 hour supervision and minimal physical assistance and follow up home health.  OT - End of Session Endurance Deficit: Yes OT Assessment Rehab Potential (ACUTE ONLY): Good OT Barriers to Discharge: Incontinence OT Patient demonstrates impairments in the following area(s): Balance;Endurance;Motor;Pain;Nutrition;Safety OT Basic ADL's Functional Problem(s): Grooming;Bathing;Dressing;Toileting OT Transfers Functional Problem(s): Toilet;Tub/Shower OT Additional Impairment(s): Fuctional Use of Upper Extremity OT Plan OT Intensity: Minimum of 1-2 x/day, 45 to 90 minutes OT Frequency: 5 out of 7 days OT Duration/Estimated Length of Stay: 3.5-4 weeks OT Treatment/Interventions: Balance/vestibular training;Discharge planning;Pain management;Self Care/advanced ADL retraining;Therapeutic Activities;UE/LE Coordination activities;Visual/perceptual remediation/compensation;Therapeutic Exercise;Functional mobility training;Patient/family education;Skin care/wound managment;Disease mangement/prevention;Community reintegration;DME/adaptive equipment instruction;Neuromuscular re-education;Psychosocial support;Splinting/orthotics;UE/LE  Strength taining/ROM;Wheelchair propulsion/positioning OT Self Feeding Anticipated Outcome(s): no goal OT Basic Self-Care Anticipated Outcome(s): S UB; MIN A OT Toileting Anticipated Outcome(s): MIN A OT Bathroom Transfers Anticipated Outcome(s): MIN A OT Recommendation Patient destination: Home Follow Up Recommendations: Home health OT Equipment  Recommended: 3 in 1 bedside comode Equipment Details: has shower chair   OT Evaluation Precautions/Restrictions    General   Vital Signs Therapy Vitals Temp: 98.2 F (36.8 C) Temp Source: Oral Pulse Rate: 93 Resp: 17 BP: 115/66 Patient Position (if appropriate): Sitting Oxygen Therapy SpO2: 99 % O2 Device: Room Air Pain   Back pain seated EOB; repositioning provided Home Living/Prior Functioning Home Living Family/patient expects to be discharged to:: Private residence Living Arrangements: Spouse/significant other, Children Available Help at Discharge: Family, Available PRN/intermittently Type of Home: House Home Access: Stairs to enter Technical brewer of Steps: 3-4 Entrance Stairs-Rails: Left Home Layout: Multi-level, Able to live on main level with bedroom/bathroom, Full bath on main level Bathroom Shower/Tub: Tub/shower unit, Walk-in shower  Lives With: Spouse IADL History Homemaking Responsibilities: Yes Meal Prep Responsibility: Secondary Laundry Responsibility: Secondary Cleaning Responsibility: Secondary Bill Paying/Finance Responsibility: Secondary Shopping Responsibility: Secondary Child Care Responsibility: Secondary Education: masters in Nursing Leisure and Hobbies: sewing, cooking, reading, cleaning Prior Function Level of Independence: Independent with transfers, Independent with gait, Independent with homemaking with ambulation  Able to Take Stairs?: Yes Driving: Yes Vocation: Retired Comments: Independent without DME; retired Press photographer; drives; enjoys babysitting grandkids  (76 and 30 y.o.), enjoys time with cat and dog, watching tv Vision Baseline Vision/History: 1 Wears glasses Patient Visual Report: No change from baseline Vision Assessment?: No apparent visual deficits Perception  Perception: Within Functional Limits Praxis Praxis: Intact Cognition Overall Cognitive Status: Within Functional Limits for tasks assessed Arousal/Alertness: Awake/alert Orientation Level: Person;Place;Situation Person: Oriented Place: Oriented Situation: Oriented Year: 2022 Month: September Day of Week: Correct Memory: Appears intact Immediate Memory Recall: Sock;Blue;Bed Memory Recall Sock: Without Cue Memory Recall Blue: Without Cue Memory Recall Bed: Without Cue Attention: Sustained;Selective Focused Attention: Appears intact Sustained Attention: Appears intact Selective Attention: Appears intact Awareness: Appears intact Problem Solving: Appears intact Safety/Judgment: Appears intact Sensation   Decreased sensation BUE; no light touch BLE from knees to toes Motor    Ataxia Unbalanced muscle activation Trunk/Postural Assessment    Head forward Scoliosis at baseline Posterior pelvic tilt Balance   Sitting balance min A statically; MOD A dynamically with BLE on floor Extremity/Trunk Assessment RUE Assessment RUE Assessment: Exceptions to [facility] General Strength Comments: generalized weakness LUE Assessment LUE Assessment: Exceptions to [facility] General Strength Comments: generalized weakness  Care Tool Care Tool Self Care Eating   Eating Assist Level: Minimal Assistance - Patient > 75%    Oral Care    Oral Care Assist Level: Set up assist    Bathing   Body parts bathed by patient: Right arm;Left arm;Face Body parts bathed by helper: Left lower leg;Right lower leg;Left upper leg;Right upper leg;Buttocks;Abdomen;Front perineal area;Chest   Assist Level: Maximal Assistance - Patient 24 - 49%    Upper Body Dressing(including orthotics)   What is the  patient wearing?: Pull over shirt   Assist Level: Moderate Assistance - Patient 50 - 74%    Lower Body Dressing (excluding footwear)   What is the patient wearing?: Incontinence brief;Pants Assist for lower body dressing: Total Assistance - Patient < 25%    Putting on/Taking off footwear   What is the patient wearing?: Non-skid slipper socks Assist for footwear: Total Assistance - Patient < 25%       Care Tool Toileting Toileting activity   Assist for toileting: Dependent - Patient 0%     Care Tool Bed Mobility Roll left and right activity   Roll left and right assist level: Total Assistance -  Patient < 25%    Sit to lying activity        Lying to sitting on side of bed activity   Lying to sitting on side of bed assist level: the ability to move from lying on the back to sitting on the side of the bed with no back support.: 2 Helpers     Care Tool Transfers Sit to stand transfer    UTA    Chair/bed transfer  [employer] transfer Toilet transfer activity did not occur: Safety/medical concerns       Care Tool Cognition  Expression of Ideas and Wants Expression of Ideas and Wants: 4. Without difficulty (complex and basic) - expresses complex messages without difficulty and with speech that is clear and easy to understand  Understanding Verbal and Non-Verbal Content Understanding Verbal and Non-Verbal Content: 4. Understands (complex and basic) - clear comprehension without cues or repetitions   Memory/Recall Ability Memory/Recall Ability : Current season;Location of own room;That he or she is in a hospital/hospital unit   Refer to Care Plan for [place] 1 OT Short Term Goal 1 (Week 1): Pt will dons shirt in unsupported sitting with no more than CGA for sitting balance OT Short Term Goal 2 (Week 1): Pt will transfer with LRAD and 1 caregiver to w/c in prep for [facility] transfers OT Short Term Goal 3 (Week 1): Pt will maintain dynamic  sitting balnace for 10 min with no more than MIN A to demo improved endurance  Recommendations for other services: None    Skilled Therapeutic Intervention 1:1. Pt educated on OT role/purpose, CIR, ELOS, and GBS recovery. OT gathers appropriate bathroom equipment and TIS/ROHO cushion. Pt overall requires total A for bed mobility/ADLs as written below. Skilled intervention included rest breaks, monitoring of vitals as well as education on position impacts on BP. Pt requires up to MOD A for sitting balance during functional tasks and initiating adjustments with head to improve sitting balance. Overall pt very motivated but extremely deconditioned. Exited session with pt seated in bed, exit alarm on and call light in reach  ADL ADL Upper Body Bathing: Moderate assistance Where Assessed-Upper Body Bathing: Edge of bed Lower Body Bathing: Maximal assistance Where Assessed-Lower Body Bathing: Bed level Upper Body Dressing: Moderate assistance Where Assessed-Upper Body Dressing: Edge of bed Lower Body Dressing: Maximal assistance Where Assessed-Lower Body Dressing: Bed level Toileting: Unable to assess Toilet Transfer: Unable to assess Mobility    Scooting to [facility] with +2 A and Chuck pad   Discharge Criteria: Patient will be discharged from OT if patient refuses treatment 3 consecutive times without medical reason, if treatment goals not met, if there is a change in medical status, if patient makes no progress towards goals or if patient is discharged from hospital.  The above assessment, treatment plan, treatment alternatives and goals were discussed and mutually agreed upon: by patient  Tonny Branch 03/05/2021, 8:29 PM

## 2021-03-05 NOTE — Progress Notes (Addendum)
Initial Nutrition Assessment  DOCUMENTATION CODES:   Non-severe (moderate) malnutrition in context of chronic illness  INTERVENTION:  48 hour calorie count initiated.  Continue Boost Breeze po TID, each supplement provides 250 kcal and 9 grams of protein.  Continue 45 ml Prosource TF po BID per Cortrak tube.   Encourage adequate PO intake.   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  GOAL:   Patient will meet greater than or equal to 90% of their needs  MONITOR:   PO intake, Supplement acceptance, Labs, Weight trends, I & O's, Skin  REASON FOR ASSESSMENT:   Consult Assessment of nutrition requirement/status, Calorie Count  ASSESSMENT:   75 year old LH-female with history of A fib s/p recent ablation, severe lumbar scoliosis, celiac disease, SVT who was admitted on 02/06/21 with 4-day history of tingling in hand and feet progressing to diffuse weakness with numbness and inabilty to walk. She was intubated on 02/08/2021. She has progressive hyponatremia, felt to be expected in setting of GBS. She underwent trach placement on 02/10/2021. She was slowly weaned to [facility] and decannulated by 09/06. limited by diffuse weakness, balance deficits, fatigue and working on balance at EOB. CIR recommended due to functional decline. Cortrak NGT remains in place.  48 hour calorie count initiated via MD. Tube feeds currently on hold. Meal completion 25% at lunch today. Pt on a gluten free diet. Kitchen to work with patient to provide more gluten free food choices. Family to additionally bring in food from home that pt prefers to eat. Pt currently has Boost Breeze ordered and has been consuming them. RD to follow up tomorrow for day 1 calorie count results.   NUTRITION - FOCUSED PHYSICAL EXAM:  Flowsheet Row Most Recent Value  Orbital Region Mild depletion  Upper Arm Region Moderate depletion  Thoracic  and Lumbar Region Unable to assess  Buccal Region Mild depletion  Temple Region Moderate depletion  Clavicle Bone Region Mild depletion  Clavicle and Acromion Bone Region Mild depletion  Scapular Bone Region Unable to assess  Dorsal Hand Unable to assess  Patellar Region Unable to assess  Anterior Thigh Region Unable to assess  Posterior Calf Region Unable to assess  Edema (RD Assessment) None  Hair Reviewed  Mouth Reviewed  Skin Reviewed  Nails Unable to assess      Labs and medications reviewed.  Diet Order:   Diet Order             Diet gluten free Room service appropriate? Yes; Fluid consistency: Thin  Diet effective now                   EDUCATION NEEDS:   Not appropriate for education at this time  Skin:  Skin Assessment: Reviewed RN Assessment  Last BM:  9/6  Height:   Ht Readings from Last 1 Encounters:  03/04/21 5' 4"  (1.626 m)    Weight:   Wt Readings from Last 1 Encounters:  03/04/21 61.6 kg    BMI:  Body mass index is 23.31 kg/m.  Estimated Nutritional Needs:   Kcal:  1800-2000  Protein:  90-105 grams  Fluid:  >/= 1.8 L/day  Corrin Parker, MS, RD, LDN RD pager number/after hours weekend pager number on Amion.

## 2021-03-06 DIAGNOSIS — G610 Guillain-Barre syndrome: Secondary | ICD-10-CM | POA: Diagnosis not present

## 2021-03-06 LAB — GLUCOSE, CAPILLARY
Glucose-Capillary: 95 mg/dL (ref 70–99)
Glucose-Capillary: 101 mg/dL — ABNORMAL HIGH (ref 70–99)
Glucose-Capillary: 101 mg/dL — ABNORMAL HIGH (ref 70–99)
Glucose-Capillary: 100 mg/dL — ABNORMAL HIGH (ref 70–99)
Glucose-Capillary: 107 mg/dL — ABNORMAL HIGH (ref 70–99)

## 2021-03-06 MED ORDER — BOOST / RESOURCE BREEZE PO LIQD CUSTOM
1.0000 | Freq: Four times a day (QID) | ORAL | Status: DC
  Administered 2021-03-06 – 2021-03-12 (×18): 1 via ORAL

## 2021-03-06 NOTE — Progress Notes (Signed)
Occupational Therapy Session Note  Patient Details  Name: Angela Morales MRN: [id number] Date of Birth: 11-15-1945  Today's Date: 03/06/2021 OT Individual Time: 1300-1400 OT Individual Time Calculation (min): 60 min    Short Term Goals: Week 1:  OT Short Term Goal 1 (Week 1): Pt will dons shirt in unsupported sitting with no more than CGA for sitting balance OT Short Term Goal 2 (Week 1): Pt will transfer with LRAD and 1 caregiver to w/c in prep for [facility] transfers OT Short Term Goal 3 (Week 1): Pt will maintain dynamic sitting balnace for 10 min with no more than MIN A to demo improved endurance  Skilled Therapeutic Interventions/Progress Updates:     Pt received in bed with unrated pain in back. Repositioning and rest provided throughout session Self Care Pt completes grooming at sink seated in TIS with set up for supported sitting. OT washes pt hair with MAX A overall and pt able ot dry hair with supervision for BUE strengthening and endurance during overhead reaching.  Therapeutic activity Pt completes supine >sitting EOB with MAX A and maintains sitting balance with MIN A Overall for placement of slide board. MAX A SB transfer EOB<> w/c with improved push/activation during transfer at end of session. OT facilitates anterior pelvic tilt/posture and pt completes w/c sit ups and reciprocal scooting to/from EOC with multimodal cuing.   Pt left at end of session in bed with exit alarm on, call light in reach and all needs met     Therapy/Group: Individual Therapy  Tonny Branch 03/06/2021, 6:59 AM

## 2021-03-06 NOTE — IPOC Note (Signed)
Overall Plan of Care [facility]) Patient Details Name: Angela Morales MRN: [id number] DOB: 1946-05-12  Admitting Diagnosis: GBS (Guillain-Barre syndrome) [facility])  Hospital Problems: Principal Problem:   GBS (Guillain-Barre syndrome) ([place])     Functional Problem List: Nursing Bladder, Bowel, Endurance, Medication Management, Pain, Safety, Sensory  PT Balance, Safety, Sensory, Endurance, Motor, Pain  OT Balance, Endurance, Motor, Pain, Nutrition, Safety  SLP    TR         Basic ADL's: OT Grooming, Bathing, Dressing, Toileting     Advanced  ADL's: OT       Transfers: PT Bed Mobility, Bed to Chair, Car, Manufacturing systems engineer, Metallurgist: PT Emergency planning/management officer, Stairs     Additional Impairments: OT Fuctional Use of Upper Extremity  SLP None      TR      Anticipated Outcomes Item Anticipated Outcome  Self Feeding no goal  Swallowing      Basic self-care  S UB; MIN A  Toileting  MIN A   Bathroom Transfers MIN A  Bowel/Bladder  Min assist  Transfers  min/CGA  Locomotion  min A gait, w/c supervision.  Communication     Cognition     Pain  < 3  Safety/Judgment  min assist and no falls   Therapy Plan: PT Intensity: Minimum of 1-2 x/day ,45 to 90 minutes PT Frequency: 5 out of 7 days PT Duration Estimated Length of Stay: 4 weeks OT Intensity: Minimum of 1-2 x/day, 45 to 90 minutes OT Frequency: 5 out of 7 days OT Duration/Estimated Length of Stay: 3.5-4 weeks     Due to the current state of emergency, patients may not be receiving their 3-hours of Medicare-mandated therapy.   Team Interventions: Nursing Interventions Patient/Family Education, Bladder Management, Bowel Management, Disease Management/Prevention, Pain Management, Medication Management, Discharge Planning  PT interventions Neuromuscular re-education, Stair training, UE/LE Strength taining/ROM, Wheelchair propulsion/positioning, Ambulation/gait training, Human resources officer, Discharge planning, Therapeutic Activities, UE/LE Coordination activities, Functional mobility training, Patient/family education, Therapeutic Exercise  OT Interventions Balance/vestibular training, Discharge planning, Pain management, Self Care/advanced ADL retraining, Therapeutic Activities, UE/LE Coordination activities, Visual/perceptual remediation/compensation, Therapeutic Exercise, Functional mobility training, Patient/family education, Skin care/wound managment, Disease mangement/prevention, Community reintegration, DME/adaptive equipment instruction, Neuromuscular re-education, Psychosocial support, Splinting/orthotics, UE/LE Strength taining/ROM, Wheelchair propulsion/positioning  SLP Interventions    TR Interventions    SW/CM Interventions Discharge Planning, Psychosocial Support, Patient/Family Education   Barriers to Discharge MD  Medical stability, Trach, Incontinence, Neurogenic bowel and bladder, Lack of/limited family support, Weight bearing restrictions, and Nutritional means  Nursing Decreased caregiver support, Home environment access/layout, Incontinence, Neurogenic Bowel & Bladder, Lack of/limited family support, Medication compliance Lives with spouse and adult son in multi-level home. 3-4 steps to enter with left rail. Able to live on main level with bedroom/bathroom. Spouse will be available 27/4.  PT Inaccessible home environment    OT Incontinence    SLP      SW Decreased caregiver support, Lack of/limited family support     Team Discharge Planning: Destination: PT-Home ,OT- Home , SLP-Home Projected Follow-up: PT-Home health PT, OT-  Home health OT, SLP-None Projected Equipment Needs: PT-To be determined, OT- 3 in 1 bedside comode, SLP-None recommended by SLP Equipment Details: PT- , OT-has shower chair Patient/family involved in discharge planning: PT- Patient,  OT-Patient, SLP-Patient, Family member/caregiver  MD ELOS: 3.5 to 4 weeks Medical Rehab  Prognosis:  Good Assessment: Pt is a 75 yr old female with Guillain Barre syndrome/AIDP- s/p trach  and NGT- poor appetite- gluten free-  Also   Has some urinary retention-  Goals min A to CGA  See Team Conference Notes for weekly updates to the plan of care

## 2021-03-06 NOTE — Progress Notes (Signed)
PROGRESS NOTE   Subjective/Complaints:  Send whole meat but says meat is "too coarse" and not eating because doesn't like the food they're sending.  Also will fail the calorie count because not eating- explained cannot get NGT out until she eats.     ROS:  Pt denies SOB, abd pain, CP, N/V/C/D, and vision changes   Objective:   No results found. Recent Labs    03/05/21 0448  WBC 6.9  HGB 11.8*  HCT 36.1  PLT 416*   Recent Labs    03/05/21 0448  NA 131*  K 3.7  CL 98  CO2 27  GLUCOSE 97  BUN 16  CREATININE 0.40*  CALCIUM 8.5*    Intake/Output Summary (Last 24 hours) at 03/06/2021 0809 Last data filed at 03/05/2021 2222 Gross per 24 hour  Intake 120 ml  Output 1100 ml  Net -980 ml        Physical Exam: Vital Signs Blood pressure 125/74, pulse 85, temperature 98.8 F (37.1 C), temperature source Oral, resp. rate 16, height 5' 4"  (1.626 m), weight 65.7 kg, last menstrual period 04/20/2005, SpO2 97 %.    General: awake, alert, appropriate, NAD HENT: conjugate gaze; oropharynx moist; NGT in nose; trach site covered with new dressing- no air leak CV: regular rate; no JVD Pulmonary: CTA B/L; no W/R/R- good air movement GI: soft, NT, ND, (+)BS Psychiatric: appropriate Neurological: alert  alert Musculoskeletal:     Cervical back: Normal range of motion and neck supple.     Comments: No edema or tenderness in extremities  Skin:    General: Skin is warm and dry.     Comments: Neck dressing CDI  Neurological:     Mental Status: She is alert.     Comments: Alert and oriented Motor: Bilateral upper extremities: 4+/5 proximal distal Bilateral lower extremities: Hip flexion, knee extension 3/5, ankle dorsiflexion 3+/5 Sensation diminished to light touch distal to knees as well as fingertips    Assessment/Plan: 1. Functional deficits which require 3+ hours per day of interdisciplinary therapy in a  comprehensive inpatient rehab setting. Physiatrist is providing close team supervision and 24 hour management of active medical problems listed below. Physiatrist and rehab team continue to assess barriers to discharge/monitor patient progress toward functional and medical goals  Care Tool:  Bathing    Body parts bathed by patient: Right arm, Left arm, Face   Body parts bathed by helper: Left lower leg, Right lower leg, Left upper leg, Right upper leg, Buttocks, Abdomen, Front perineal area, Chest     Bathing assist Assist Level: Maximal Assistance - Patient 24 - 49%     Upper Body Dressing/Undressing Upper body dressing   What is the patient wearing?: Pull over shirt    Upper body assist Assist Level: Moderate Assistance - Patient 50 - 74%    Lower Body Dressing/Undressing Lower body dressing      What is the patient wearing?: Incontinence brief, Pants     Lower body assist Assist for lower body dressing: Total Assistance - Patient < 25%     Toileting Toileting    Toileting assist Assist for toileting: Dependent - Patient 0%  Transfers Chair/bed transfer  Transfers assist     Chair/bed transfer assist level: Total Assistance - Patient < 25%     Locomotion Ambulation   Ambulation assist   Ambulation activity did not occur: Safety/medical concerns          Walk 10 feet activity   Assist  Walk 10 feet activity did not occur: Safety/medical concerns        Walk 50 feet activity   Assist Walk 50 feet with 2 turns activity did not occur: Safety/medical concerns         Walk 150 feet activity   Assist Walk 150 feet activity did not occur: Safety/medical concerns         Walk 10 feet on uneven surface  activity   Assist Walk 10 feet on uneven surfaces activity did not occur: Safety/medical concerns         Wheelchair     Assist Is the patient using a wheelchair?: Yes Type of Wheelchair: Manual Wheelchair activity did  not occur: Safety/medical concerns         Wheelchair 50 feet with 2 turns activity    Assist    Wheelchair 50 feet with 2 turns activity did not occur: Safety/medical concerns       Wheelchair 150 feet activity     Assist  Wheelchair 150 feet activity did not occur: Safety/medical concerns       Blood pressure 125/74, pulse 85, temperature 98.8 F (37.1 C), temperature source Oral, resp. rate 16, height 5' 4"  (1.626 m), weight 65.7 kg, last menstrual period 04/20/2005, SpO2 97 %.      Medical Problem List and Plan: 1.  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              -patient may shower with neck covered             -ELOS/Goals: 20-24 days/min a             Continue CIR- PT, OT 2.  Antithrombotics: -DVT/anticoagulation:  Pharmaceutical: Other (comment)--Eliquis bid             -antiplatelet therapy: -- N/A 3. Scoliosis/Pain Management: Lidocaine patches. Gabapentin 200 mg/HS per Dr. Saintclair Halsted.               -low dose oxycodone prn  9/8- pain increased last night, but fixed bed issue- con't regimen             --Monitor with increased exertion 4. Mood:  LCSW to follow for evaluation and support.              -antipsychotic agents: N/A 5. Neuropsych: This patient is capable of making decisions on her own behalf. 6. Skin/Wound Care: Routine pressure relief measures.  7. Fluids/Electrolytes/Nutrition: Monitor I/Os--feels full "food being shoved at her all the time."   9/9- consulted dietary again to remind them how we can change so she has more choices? -- Continue K dur for now.  8. A fib: Monitor HR TID--amiodarone decreased to once daily on 09/06             --metoprolol d/c due to hypotension             Monitor with increased activity 9. Urinary retention: Schedule toileting--will monitor voiding with PVR checks.              --urecholine increased to 20 mg TID on 09/07-->monitor for orthostasis 9/8-  will start Flomax 0.4  mg QHS- not allergic to Sulfa.  10. Dysphagia: Has resolved and tolerating regular diet but full due to ongoing tube feeds             --will hold tube feeds --change to oral supplements. Start calorie count  9/8- on calorie count- will d/w dietician if needs to con't the feeding tube or if she's eating enough 9/9- not getting TF's- dietary as above.              11. Hypoxic hypercarbic respiratory failure: Continue aspiration precautions.   9/8- trach out 2 days ago- trach site healing- con't to monitor 12. Hyponatremia: Na improving from nadir of 115-->127/130 for the past week.              --9/8- Na up to 131- will con't to monitor 13.  Sleep disturbance: Continue Seroquel and melatonin             --need to cautious re: uptiration-->hypotension  --Wean as tolerated 14. Hyperglycemia:  Hgb a1C-5.0-->likely due to tube feeds and supplement between meals.             ---will monitor CBG X 48 hours.     LOS: 2 days A FACE TO FACE EVALUATION WAS PERFORMED  Sergio Hobart 03/06/2021, 8:09 AM

## 2021-03-06 NOTE — Progress Notes (Signed)
Physical Therapy Session Note  Patient Details  Name: Angela Morales MRN: [id number] Date of Birth: 28-Dec-1945  Today's Date: 03/06/2021 PT Individual Time: 0900-1000 PT Individual Time Calculation (min): 60 min   Short Term Goals: Week 1:  PT Short Term Goal 1 (Week 1): Pt will transfer sidelying to sit w/ min A. PT Short Term Goal 2 (Week 1): Pt will transfer sit to stand w/ max A PT Short Term Goal 3 (Week 1): Pt will negotiate w/c x 30' w/ min A. PT Short Term Goal 4 (Week 1): Pt will transfer bed<> w/cc w/ SB and mod A  Skilled Therapeutic Interventions/Progress Updates: Pt presented in bed agreeable to therapy. Pt denies pain at rest however c/o back pain during some mobility. Session focused on functional mobility including bed mobility and transfers. Pt noted to be incontinent of bladder with pt and PTA performing rolling L/R with modA requiring assist for BLE management and hips to fully roll to sidelying. PTA provided total A for peri-care and donning new brief. Pt performed additional rolling at same level to don pants. Performed supine to sit via log roll with mod to maxA to sit EOB. Pt requiring minA to inially maintain sitting balance. As PTA moved in front of pt, pt with LOB posteriorly hitting head against bed rail. Pt denies any pain however nsg notified during session. Pt then using hand on bed rail for support while remaining in sitting. PTA then donned aircasts and shoes total A. Pt performed SB transfer to [place] x2 with PTA using Bobath technique to encourage head/hips relationship. In chair pt taken through unit to acclimate pt then taken to ortho gym and pt participated in [place] program. Pt used visual scanning/sequencing for forward reaching with slight lateral reaching. X 2 occurences pt received hand over hand assist due to further reach outside BOS. Pt c/o mild increase in back pain when reaching laterally to right but resolved with rest. Pt with 47% and 69% accuracy  respectively. Pt transported back to room at end of session and remained in TIS as next session ~15 later. Pt left with belt alarm on, call bell within reach and current needs met.      Therapy Documentation Precautions:  Precautions Precautions: Fall, Other (comment) Precaution Comments: cortrak; bladder/bowel incontinence, log roll for back pain even PTA Required Braces or Orthoses: Other Brace Other Brace: bilateral ankle air splints Restrictions Weight Bearing Restrictions: No General:   Vital Signs:   Pain: Pain Assessment Pain Score: 1  PAINAD (Pain Assessment in Advanced Dementia) Breathing: normal Negative Vocalization: none Facial Expression: smiling or inexpressive Body Language: relaxed Consolability: no need to console PAINAD Score: 0  Therapy/Group: Individual Therapy  Fahd Galea Venessa Wickham, PTA  03/06/2021, 11:03 AM

## 2021-03-06 NOTE — Progress Notes (Signed)
Nutrition Follow-up  DOCUMENTATION CODES:   Non-severe (moderate) malnutrition in context of chronic illness  INTERVENTION:  48 hour calorie count ongoing  Provide Boost Breeze po QID, each supplement provides 250 kcal and 9 grams of protein.   Continue 45 ml Prosource TF po BID per Cortrak tube.    Encourage adequate PO intake.   Gluten free food choices discussed with patient.   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 She was intubated on 02/08/2021. She has progressive hyponatremia, felt to be expected in setting of GBS. She underwent trach placement on 02/10/2021. She was slowly weaned to [facility] and decannulated by 09/06. limited by diffuse weakness, balance deficits, fatigue and working on balance at EOB. CIR recommended due to functional decline. Cortrak NGT remains in place.  Calorie count over the past 24 hours: Breakfast: 104 kcal, 10 grams of protein Lunch:313 kcal, 11 grams of protein Dinner: 174 kcal, 9 grams of protein Supplements: 500 kcal, 18 grams of protein.  Total: 1091 kcal (61% of kcal needs), 48 grams of protein (53% of protein needs)  Discussed gluten free food options with patient. RD to increase Boost Breeze to QID to aid in caloric and protein needs. Cortrak NGT remains in place. TF on hold. RD to follow up with full calorie count  Monday.   Labs and medications reviewed.   Diet Order:   Diet Order             Diet gluten free Room service appropriate? Yes; Fluid consistency: Thin  Diet effective now                   EDUCATION NEEDS:   Not appropriate for education at this time  Skin:  Skin Assessment: Reviewed RN Assessment  Last BM:  9/8  Height:   Ht Readings from Last 1 Encounters:  03/04/21 5' 4"  (1.626 m)    Weight:   Wt Readings from Last 1 Encounters:  03/06/21 65.7 kg   BMI:  Body mass index is 24.86 kg/m.  Estimated Nutritional Needs:   Kcal:  1800-2000  Protein:  90-105 grams  Fluid:  >/= 1.8 L/day  Corrin Parker, MS, RD, LDN RD pager number/after hours weekend pager number on Amion.

## 2021-03-06 NOTE — Care Management (Signed)
[facility] Individual Statement of Services  Patient Name:  Angela Morales  Date:  03/06/2021  Welcome to the [facility].  Our goal is to provide you with an individualized program based on your diagnosis and situation, designed to meet your specific needs.  With this comprehensive rehabilitation program, you will be expected to participate in at least 3 hours of rehabilitation therapies Monday-Friday, with modified therapy programming on the weekends.  Your rehabilitation program will include the following services:  Physical Therapy (PT), Occupational Therapy (OT), Speech Therapy (ST), 24 hour per day rehabilitation nursing, Therapeutic Recreaction (TR), Psychology, Neuropsychology, Care Coordinator, Rehabilitation Medicine, [place], and Other  Weekly team conferences will be held on Tuesdays to discuss your progress.  Your Inpatient Rehabilitation Care Coordinator will talk with you frequently to get your input and to update you on team discussions.  Team conferences with you and your family in attendance may also be held.  Expected length of stay: 3-3.5 weeks    Overall anticipated outcome: Minimal Assistance  Depending on your progress and recovery, your program may change. Your Inpatient Rehabilitation Care Coordinator will coordinate services and will keep you informed of any changes. Your Inpatient Rehabilitation Care Coordinator's name and contact numbers are listed  below.  The following services may also be recommended but are not provided by the [facility] will be made to provide these services after discharge if needed.  Arrangements include referral to agencies that provide these services.  Your insurance has been verified to be:  [facility] Medicare  Your  primary doctor is:  Deland Pretty  Pertinent information will be shared with your doctor and your insurance company.  Inpatient Rehabilitation Care Coordinator:  Cathleen Corti [phone] or ([phone]  Information discussed with and copy given to patient by: Rana Snare, 03/06/2021, 8:52 AM

## 2021-03-06 NOTE — Progress Notes (Signed)
Physical Therapy Session Note  Patient Details  Name: Angela Morales MRN: [id number] Date of Birth: [birth year]  Today's Date: 03/06/2021 PT Individual Time: 1020-1045 AND 1625-1720 PT Individual Time Calculation (min): 25 min AND 64 MIN   Short Term Goals: Week 1:  PT Short Term Goal 1 (Week 1): Pt will transfer sidelying to sit w/ min A. PT Short Term Goal 2 (Week 1): Pt will transfer sit to stand w/ max A PT Short Term Goal 3 (Week 1): Pt will negotiate w/c x 30' w/ min A. PT Short Term Goal 4 (Week 1): Pt will transfer bed<> w/cc w/ SB and mod A  Skilled Therapeutic Interventions/Progress Updates:  Session 1.   Pt received sitting in WC and agreeable to PT, denies pain throughout session. SB transfer to med on the L with max assist and max cues for UE/LE placement for safety. Sit>supine with total A* for trunk and BLE management. PT doffed shoes. Supine NMR for flexion/extension in synergy x 10BLE, hip abduction x 10 BLE, ankle DF x 15 with AAROM. PT performed prolonged heel cord stretch 2 x 2 min BLE for improved ROM. Pt left supine in bed with call bell in reach.   SESSION 2.  Pt received supine in bed and agreeable to PT. Only mild pain in the R hip that pt does not rate in transfers. Repositioned when present then reportedly subsides. TOTAL A* to don shoes and ankle splints.  Supine>sit transfer with max assist through log roll on the R cues for UE placement to sit with min assist while PT placed SB. SB transfers performs x 2 throughout session with max assist to the R and mod assist to the L. Pt transported to enterance of [place] in [place]. PT adjusted BLE leg rests to improve positioning in WC to reduce pressure points on sacrum and allow increased tolerance to sitting. Seated NMR: LAQ with AAROM, hip abduction with manual resistance, hip extension with manual resistance; each completed 2x8 with cues for decreased sped and full ROM. Pt returned to room and performed slIDE BOARD transfer to  bed with MOD ASSIST AS listed. Sit>supine completed with +2 assist due to mild R hip pain. Pt  left supine in bed with call bell in reach and all needs met.        Therapy Documentation Precautions:  Precautions Precautions: Fall, Other (comment) Precaution Comments: cortrak; bladder/bowel incontinence, log roll for back pain even PTA Required Braces or Orthoses: Other Brace Other Brace: bilateral ankle air splints Restrictions Weight Bearing Restrictions: No  Vital Signs: Therapy Vitals Temp: 97.7 F (36.5 C) Temp Source: Oral Pulse Rate: 85 Resp: 18 BP: 118/63 Patient Position (if appropriate): Lying Oxygen Therapy SpO2: 98 % O2 Device: Room Air Pain: Denies at rest (see above)    Therapy/Group: Individual Therapy  Lorie Phenix 03/06/2021, 5:22 PM

## 2021-03-07 DIAGNOSIS — R1312 Dysphagia, oropharyngeal phase: Secondary | ICD-10-CM | POA: Diagnosis not present

## 2021-03-07 DIAGNOSIS — R339 Retention of urine, unspecified: Secondary | ICD-10-CM

## 2021-03-07 DIAGNOSIS — G610 Guillain-Barre syndrome: Secondary | ICD-10-CM | POA: Diagnosis not present

## 2021-03-07 LAB — GLUCOSE, CAPILLARY
Glucose-Capillary: 101 mg/dL — ABNORMAL HIGH (ref 70–99)
Glucose-Capillary: 105 mg/dL — ABNORMAL HIGH (ref 70–99)
Glucose-Capillary: 124 mg/dL — ABNORMAL HIGH (ref 70–99)
Glucose-Capillary: 96 mg/dL (ref 70–99)

## 2021-03-07 MED ORDER — PANTOPRAZOLE SODIUM 40 MG PO PACK
40.0000 mg | PACK | Freq: Every day | ORAL | Status: DC
  Administered 2021-03-07 – 2021-03-08 (×2): 40 mg via ORAL
  Filled 2021-03-07: qty 20

## 2021-03-07 MED ORDER — BETHANECHOL CHLORIDE 25 MG PO TABS
25.0000 mg | ORAL_TABLET | Freq: Three times a day (TID) | ORAL | Status: DC
  Administered 2021-03-07 – 2021-03-12 (×17): 25 mg via ORAL
  Filled 2021-03-07 (×17): qty 1

## 2021-03-07 NOTE — Progress Notes (Signed)
Occupational Therapy Session Note  Patient Details  Name: Angela Morales MRN: [id number] Date of Birth: [birth year]  Today's Date: 03/08/2021 OT Individual Time: 8546-2703 OT Individual Time Calculation (min): 55 min   Short Term Goals: Week 1:  OT Short Term Goal 1 (Week 1): Pt will dons shirt in unsupported sitting with no more than CGA for sitting balance OT Short Term Goal 2 (Week 1): Pt will transfer with LRAD and 1 caregiver to w/c in prep for [facility] transfers OT Short Term Goal 3 (Week 1): Pt will maintain dynamic sitting balnace for 10 min with no more than MIN A to demo improved endurance  Skilled Therapeutic Interventions/Progress Updates:    Pt greeted in bed, reporting some Lt ankle pain last night but since then pain had absolved. Rest breaks and repositioning provided as pain mgt interventions during session. She was agreeable to engage in bathing/dressing tasks during tx. Placed bed in chair position to work on trunk control during participation. Pt able to pull herself forward with B bedrails in order for OT to wash her back. Min-Mod A for donning overhead shirt, pt once again pulling herself into an unsupported sitting position to meet demands of task. Worked on LE flexibility/mobility during LB self care. 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 Able to tolerate modified figure 4 and very minimal leg elevation (via straight leg raise for hamstring stretching). Per pt, this caused pain behind her knee. Total A for LB bathing/dressing including donning her B aircasts. Note that she was able to minimally lift each LE against gravity to assist therapist! Per pt, this is new. +2 assist (with RN) for perihygiene/brief change with pt rolling Rt>Lt with Mod A. Oral care completed with setup, HOB elevated. Pt remained comfortably in bed at close of session, all needs within reach and bed alarm set. Tx focus placed on activity tolerance (pt needing a few  rest breaks between rolling), ADL retraining, and UB/LB NMR.   Therapy Documentation Precautions:  Precautions Precautions: Fall, Other (comment) Precaution Comments: cortrak; bladder/bowel incontinence, log roll for back pain even PTA Required Braces or Orthoses: Other Brace Other Brace: bilateral ankle air splints Restrictions Weight Bearing Restrictions: No  Pain: Pain Assessment Pain Scale: 0-10 Pain Score: 0-No pain Pain Type: Acute pain Pain Location: Leg Pain Orientation: Right;Left Pain Frequency: Constant Pain Onset: On-going Pain Intervention(s): Medication (See eMAR) ADL: ADL Upper Body Bathing: Moderate assistance Where Assessed-Upper Body Bathing: Edge of bed Lower Body Bathing: Maximal assistance Where Assessed-Lower Body Bathing: Bed level Upper Body Dressing: Moderate assistance Where Assessed-Upper Body Dressing: Edge of bed Lower Body Dressing: Maximal assistance Where Assessed-Lower Body Dressing: Bed level Toileting: Unable to assess Toilet Transfer: Unable to assess     Therapy/Group: Individual Therapy  Jaiel Saraceno A Analissa Bayless 03/08/2021, 12:28 PM

## 2021-03-07 NOTE — Progress Notes (Signed)
PROGRESS NOTE   Subjective/Complaints:  C/o low back pain as well as pain in legs and feet. Able to sleep  ROS: Patient denies fever, rash, sore throat, blurred vision, nausea, vomiting, diarrhea, cough, shortness of breath or chest pain, joint or back pain, headache, or mood change.    Objective:   No results found. Recent Labs    03/05/21 0448  WBC 6.9  HGB 11.8*  HCT 36.1  PLT 416*   Recent Labs    03/05/21 0448  NA 131*  K 3.7  CL 98  CO2 27  GLUCOSE 97  BUN 16  CREATININE 0.40*  CALCIUM 8.5*    Intake/Output Summary (Last 24 hours) at 03/07/2021 0836 Last data filed at 03/07/2021 0801 Gross per 24 hour  Intake 600 ml  Output 1050 ml  Net -450 ml        Physical Exam: Vital Signs Blood pressure 109/60, pulse 86, temperature 98.5 F (36.9 C), temperature source Oral, resp. rate 16, height 5' 4"  (1.626 m), weight 65.5 kg, last menstrual period 04/20/2005, SpO2 97 %.    Constitutional: No distress . Vital signs reviewed. HEENT: NCAT, EOMI, oral membranes moist, NGT Neck: supple Cardiovascular: RRR without murmur. No JVD    Respiratory/Chest: CTA Bilaterally without wheezes or rales. Normal effort    GI/Abdomen: BS +, non-tender, non-distended Ext: no clubbing, cyanosis, or edema Psych: pleasant and cooperative  Neurological: alert  alert Musculoskeletal:     Cervical back: Normal range of motion and neck supple.     Comments: No edema or tenderness in extremities  Skin:    General: Skin is warm and dry.     Comments: Neck dressing remains CDI  Neurological:     Mental Status: She is alert.     Comments: Alert and oriented Motor: Bilateral upper extremities: 4+/5 proximal distal Bilateral lower extremities: Hip flexion, knee extension 3/5, ankle dorsiflexion 3+/5 Sensation diminished to light touch distal to knees as well as fingertips--no change    Assessment/Plan: 1. Functional  deficits which require 3+ hours per day of interdisciplinary therapy in a comprehensive inpatient rehab setting. Physiatrist is providing close team supervision and 24 hour management of active medical problems listed below. Physiatrist and rehab team continue to assess barriers to discharge/monitor patient progress toward functional and medical goals  Care Tool:  Bathing    Body parts bathed by patient: Right arm, Left arm, Face   Body parts bathed by helper: Left lower leg, Right lower leg, Left upper leg, Right upper leg, Buttocks, Abdomen, Front perineal area, Chest     Bathing assist Assist Level: Maximal Assistance - Patient 24 - 49%     Upper Body Dressing/Undressing Upper body dressing   What is the patient wearing?: Pull over shirt    Upper body assist Assist Level: Moderate Assistance - Patient 50 - 74%    Lower Body Dressing/Undressing Lower body dressing      What is the patient wearing?: Incontinence brief, Pants     Lower body assist Assist for lower body dressing: Total Assistance - Patient < 25%     Toileting Toileting    Toileting assist Assist for toileting: Dependent -  Patient 0%     Transfers Chair/bed transfer  Transfers assist     Chair/bed transfer assist level: Total Assistance - Patient < 25%     Locomotion Ambulation   Ambulation assist   Ambulation activity did not occur: Safety/medical concerns          Walk 10 feet activity   Assist  Walk 10 feet activity did not occur: Safety/medical concerns        Walk 50 feet activity   Assist Walk 50 feet with 2 turns activity did not occur: Safety/medical concerns         Walk 150 feet activity   Assist Walk 150 feet activity did not occur: Safety/medical concerns         Walk 10 feet on uneven surface  activity   Assist Walk 10 feet on uneven surfaces activity did not occur: Safety/medical concerns         Wheelchair     Assist Is the patient using  a wheelchair?: Yes Type of Wheelchair: Manual Wheelchair activity did not occur: Safety/medical concerns         Wheelchair 50 feet with 2 turns activity    Assist    Wheelchair 50 feet with 2 turns activity did not occur: Safety/medical concerns       Wheelchair 150 feet activity     Assist  Wheelchair 150 feet activity did not occur: Safety/medical concerns       Blood pressure 109/60, pulse 86, temperature 98.5 F (36.9 C), temperature source Oral, resp. rate 16, height 5' 4"  (1.626 m), weight 65.5 kg, last menstrual period 04/20/2005, SpO2 97 %.      Medical Problem List and Plan: 1.  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              -patient may shower with neck covered             -ELOS/Goals: 20-24 days/min a            -Continue CIR therapies including PT, OT  2.  Antithrombotics: -DVT/anticoagulation:  Pharmaceutical: Other (comment)--Eliquis bid             -antiplatelet therapy: -- N/A 3. Scoliosis/Pain Management: Lidocaine patches. Gabapentin 200 mg/HS per Dr. Saintclair Halsted.               -low dose oxycodone prn  9/10 pain levels stable--continue above 4. Mood:  LCSW to follow for evaluation and support.              -antipsychotic agents: N/A 5. Neuropsych: This patient is capable of making decisions on her own behalf. 6. Skin/Wound Care: Routine pressure relief measures.  7. Fluids/Electrolytes/Nutrition: Monitor I/Os--feels full "food being shoved at her all the time."   9/10 appreciate RD input re: nutrition, food choices, etc, calorie ct -- Continue K dur for now.  8. A fib: Monitor HR TID--amiodarone decreased to once daily on 09/06             --metoprolol d/c due to hypotension             Monitor with increased activity 9. Urinary retention: Schedule toileting--will monitor voiding with PVR checks.              --urecholine increased to 20 mg TID on 09/07-->monitor for orthostasis 9/8-   t Flomax 0.4 mg QHS- not allergic to Sulfa.  9/10 still retaining 450+, increase urecholine to 5m 10. Dysphagia: Has resolved  and tolerating regular diet but full due to ongoing tube feeds             --will hold tube feeds --change to oral supplements. Start calorie count  9/10- on calorie count- RD to follow up monday    11. Hypoxic hypercarbic respiratory failure: Continue aspiration precautions.   9/8- trach out 2 days ago- trach site healing- con't to monitor 12. Hyponatremia: Na improving from nadir of 115-->127/130 for the past week.              --9/8- Na up to 131- will con't to monitor 13.  Sleep disturbance: Continue Seroquel and melatonin             --need to cautious re: uptiration-->hypotension  --Wean as tolerated 14. Hyperglycemia:  Hgb a1C-5.0-->likely due to tube feeds and supplement between meals.             ---will monitor CBG X 48 hours.    -cbg's normal---dc 91/0  LOS: 3 days A FACE TO FACE EVALUATION WAS PERFORMED  Meredith Staggers 03/07/2021, 8:36 AM

## 2021-03-08 DIAGNOSIS — R1312 Dysphagia, oropharyngeal phase: Secondary | ICD-10-CM | POA: Diagnosis not present

## 2021-03-08 DIAGNOSIS — R339 Retention of urine, unspecified: Secondary | ICD-10-CM | POA: Diagnosis not present

## 2021-03-08 DIAGNOSIS — G610 Guillain-Barre syndrome: Secondary | ICD-10-CM | POA: Diagnosis not present

## 2021-03-08 MED ORDER — POTASSIUM CHLORIDE CRYS ER 20 MEQ PO TBCR
60.0000 meq | EXTENDED_RELEASE_TABLET | Freq: Every day | ORAL | Status: DC
  Administered 2021-03-09 – 2021-03-12 (×4): 60 meq via ORAL
  Filled 2021-03-08 (×4): qty 3

## 2021-03-08 MED ORDER — PANTOPRAZOLE SODIUM 40 MG PO TBEC
40.0000 mg | DELAYED_RELEASE_TABLET | Freq: Every day | ORAL | Status: DC
  Administered 2021-03-09 – 2021-03-11 (×3): 40 mg via ORAL
  Filled 2021-03-08 (×4): qty 1

## 2021-03-08 MED ORDER — GABAPENTIN 100 MG PO CAPS
200.0000 mg | ORAL_CAPSULE | Freq: Every day | ORAL | Status: DC
  Administered 2021-03-08 – 2021-03-11 (×4): 200 mg via ORAL
  Filled 2021-03-08 (×4): qty 2

## 2021-03-08 MED ORDER — DOCUSATE SODIUM 100 MG PO CAPS
100.0000 mg | ORAL_CAPSULE | Freq: Two times a day (BID) | ORAL | Status: DC
  Administered 2021-03-09: 100 mg via ORAL
  Filled 2021-03-08 (×8): qty 1

## 2021-03-08 NOTE — Progress Notes (Signed)
PROGRESS NOTE   Subjective/Complaints:  Experienced left foot pain/ankle pain overnight. Wondered if it was a sign of recovery in leg. No other issues this morning  ROS: Patient denies fever, rash, sore throat, blurred vision, nausea, vomiting, diarrhea, cough, shortness of breath or chest pain,  headache, or mood change.   Objective:   No results found. No results for input(s): WBC, HGB, HCT, PLT in the last 72 hours.  No results for input(s): NA, K, CL, CO2, GLUCOSE, BUN, CREATININE, CALCIUM in the last 72 hours.   Intake/Output Summary (Last 24 hours) at 03/08/2021 0742 Last data filed at 03/07/2021 1903 Gross per 24 hour  Intake 540 ml  Output 350 ml  Net 190 ml        Physical Exam: Vital Signs Blood pressure 126/61, pulse 86, temperature 99.4 F (37.4 C), temperature source Oral, resp. rate 17, height 5' 4"  (1.626 m), weight 63.6 kg, last menstrual period 04/20/2005, SpO2 98 %.    Constitutional: No distress . Vital signs reviewed. HEENT: NCAT, EOMI, oral membranes moist Neck: supple Cardiovascular: RRR without murmur. No JVD    Respiratory/Chest: CTA Bilaterally without wheezes or rales. Normal effort    GI/Abdomen: BS +, non-tender, non-distended Ext: no clubbing, cyanosis, or edema Psych: pleasant and cooperative  Neurological: alert  alert Musculoskeletal:     Cervical back: Normal range of motion and neck supple.     Comments: No edema or tenderness in extremities  Skin:    General: Skin is warm and dry.     Comments: Neck dressing remains CDI  Neurological:     Mental Status: She is alert.     Comments: Alert and oriented Motor: Bilateral upper extremities: 4+/5 proximal distal Bilateral lower extremities: Hip flexion, knee extension 3/5, ankle dorsiflexion 3+/5 grossly Sensation diminished to light touch distal to knees as well as fingertips--no substantial change 9/11    Assessment/Plan: 1.  Functional deficits which require 3+ hours per day of interdisciplinary therapy in a comprehensive inpatient rehab setting. Physiatrist is providing close team supervision and 24 hour management of active medical problems listed below. Physiatrist and rehab team continue to assess barriers to discharge/monitor patient progress toward functional and medical goals  Care Tool:  Bathing    Body parts bathed by patient: Right arm, Left arm, Face   Body parts bathed by helper: Left lower leg, Right lower leg, Left upper leg, Right upper leg, Buttocks, Abdomen, Front perineal area, Chest     Bathing assist Assist Level: Maximal Assistance - Patient 24 - 49%     Upper Body Dressing/Undressing Upper body dressing   What is the patient wearing?: Pull over shirt    Upper body assist Assist Level: Moderate Assistance - Patient 50 - 74%    Lower Body Dressing/Undressing Lower body dressing      What is the patient wearing?: Incontinence brief, Pants     Lower body assist Assist for lower body dressing: Total Assistance - Patient < 25%     Toileting Toileting    Toileting assist Assist for toileting: Dependent - Patient 0%     Transfers Chair/bed transfer  Transfers assist     Chair/bed transfer  assist level: Total Assistance - Patient < 25%     Locomotion Ambulation   Ambulation assist   Ambulation activity did not occur: Safety/medical concerns          Walk 10 feet activity   Assist  Walk 10 feet activity did not occur: Safety/medical concerns        Walk 50 feet activity   Assist Walk 50 feet with 2 turns activity did not occur: Safety/medical concerns         Walk 150 feet activity   Assist Walk 150 feet activity did not occur: Safety/medical concerns         Walk 10 feet on uneven surface  activity   Assist Walk 10 feet on uneven surfaces activity did not occur: Safety/medical concerns         Wheelchair     Assist Is the  patient using a wheelchair?: Yes Type of Wheelchair: Manual Wheelchair activity did not occur: Safety/medical concerns         Wheelchair 50 feet with 2 turns activity    Assist    Wheelchair 50 feet with 2 turns activity did not occur: Safety/medical concerns       Wheelchair 150 feet activity     Assist  Wheelchair 150 feet activity did not occur: Safety/medical concerns       Blood pressure 126/61, pulse 86, temperature 99.4 F (37.4 C), temperature source Oral, resp. rate 17, height 5' 4"  (1.626 m), weight 63.6 kg, last menstrual period 04/20/2005, SpO2 98 %.      Medical Problem List and Plan: 1.  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              -patient may shower with neck covered             -ELOS/Goals: 20-24 days/min a           -Continue CIR therapies including PT, OT, and SLP  2.  Antithrombotics: -DVT/anticoagulation:  Pharmaceutical: Other (comment)--Eliquis bid             -antiplatelet therapy: -- N/A 3. Scoliosis/Pain Management: Lidocaine patches. Gabapentin 200 mg/HS per Dr. Saintclair Halsted.               -low dose oxycodone prn  9/11 pain levels stable--discussed that her neuropathic pain could wax and wane 4. Mood:  LCSW to follow for evaluation and support.              -antipsychotic agents: N/A 5. Neuropsych: This patient is capable of making decisions on her own behalf. 6. Skin/Wound Care: Routine pressure relief measures.  7. Fluids/Electrolytes/Nutrition: Monitor I/Os--feels full "food being shoved at her all the time."   9/11 appreciate RD input re: nutrition, food choices, etc, calorie ct -- Continue K dur for now.  -she's eating well. Weight generally trending up -dc NGT 8. A fib: Monitor HR TID--amiodarone decreased to once daily on 09/06             --metoprolol d/c due to hypotension             Monitor with increased activity 9. Urinary retention: Schedule toileting--will  monitor voiding with PVR checks.              --urecholine increased to 20 mg TID on 09/07-->monitor for orthostasis 9/8-  t Flomax 0.4 mg QHS- not allergic to Sulfa.  9/11 retaining 350cc or less, continue urecholine 56m TID 10. Dysphagia: on regular  diet, gluten free    11. Hypoxic hypercarbic respiratory failure: Continue aspiration precautions.   9/8- trach out 2 days ago- trach site healing- con't to monitor 12. Hyponatremia: Na improving from nadir of 115-->127/130 for the past week.              --9/8- Na up to 131- will con't to monitor 13.  Sleep disturbance: Continue Seroquel and melatonin             --need to cautious re: uptiration-->hypotension  --Wean as tolerated 14. Hyperglycemia:  Hgb a1C-5.0-->likely due to tube feeds and supplement between meals.             ---will monitor CBG X 48 hours.    -cbg's normal---dc'ed 91/0  LOS: 4 days A FACE TO FACE EVALUATION WAS PERFORMED  Meredith Staggers 03/08/2021, 7:42 AM

## 2021-03-08 NOTE — Progress Notes (Signed)
Physical Therapy Session Note  Patient Details  Name: Angela Morales MRN: [id number] Date of Birth: 1946/01/18  Today's Date: 03/08/2021 PT Individual Time: 1100-1155; 1615-1700 PT Individual Time Calculation (min): 55 min and 45 min  Short Term Goals: Week 1:  PT Short Term Goal 1 (Week 1): Pt will transfer sidelying to sit w/ min A. PT Short Term Goal 2 (Week 1): Pt will transfer sit to stand w/ max A PT Short Term Goal 3 (Week 1): Pt will negotiate w/c x 30' w/ min A. PT Short Term Goal 4 (Week 1): Pt will transfer bed<> w/cc w/ SB and mod A  Skilled Therapeutic Interventions/Progress Updates:    Session 1: Pt received seated in bed, agreeable to PT session. Pt reports some pain in R hip and lower back at rest, not rated. Pt reports she received pain medication prior to start of therapy session. Assisted pt with dependently donning shoes at bed level, pt already with B ankle aircasts in place. Supine to sit with max A for BLE management and some trunk elevation with HOB elevated and use of bedrail. Slide board transfer bed to w/c with max A. Dependent transport via TIS to/from therapy gym. Attempt sit to stand in // bars with B knee blocked and total A. Pt unable to extend trunk and head for full upright stance. Pt discouraged by inability to stand this date, provided emotional support and encouragement as pt reports she needed assist x 3 and stedy to stand during last attempt. Remainder of session focused on core stretching and strengthening. Seated anterior and R/L lateral leans x 10 reps each onto mat table with no assist needed to return to midline. Pt reports stretching of low back with anterior leans, some pain in R side with lateral leans that improves at rest. Pt agreeable to remain seated in in chair for lunch at end of session, needs in reach and TIS semi-reclined.  Session 2: Pt received supine in bed, agreeable to PT session. Pt reports pain in R hip at rest, declines  intervention and prefers to save pain medication prior to bedtime so she can sleep better. Pt is dependent to don pants and shoes at bed level, mod A for rolling R/L. Supine to sit with max A for BLE management and trunk control. Attempt to stand to stedy x 3 attempts. Placed sheet around pt's hips/buttocks to assist with lifting. Pt unable to achieve full stand due to core and BLE weakness. Sit to supine mod A for BLE management. Hook-lying posterior pelvic tilts 2 x 10 reps with B knees supported, SKFO x 10 reps B with assist needed to stabilize contralateral knee. Pt left semi-reclined in bed with needs in reach, bed alarm in place at end of session.  Therapy Documentation Precautions:  Precautions Precautions: Fall, Other (comment) Precaution Comments: cortrak; bladder/bowel incontinence, log roll for back pain even PTA Required Braces or Orthoses: Other Brace Other Brace: bilateral ankle air splints Restrictions Weight Bearing Restrictions: No      Therapy/Group: Individual Therapy   Excell Seltzer, PT, DPT, CSRS  03/08/2021, 12:33 PM

## 2021-03-08 NOTE — Plan of Care (Signed)
  Problem: SCI BOWEL ELIMINATION Goal: RH STG MANAGE BOWEL WITH ASSISTANCE Description: STG Manage Bowel with min Assistance. Outcome: Not Progressing; incontinence   Problem: SCI BLADDER ELIMINATION Goal: RH STG MANAGE BLADDER WITH ASSISTANCE Description: STG Manage Bladder With min Assistance Outcome: Not Progressing; in and out cath

## 2021-03-09 DIAGNOSIS — G610 Guillain-Barre syndrome: Secondary | ICD-10-CM | POA: Diagnosis not present

## 2021-03-09 LAB — CBC
HCT: 36.3 % (ref 36.0–46.0)
Hemoglobin: 12 g/dL (ref 12.0–15.0)
MCH: 32.3 pg (ref 26.0–34.0)
MCHC: 33.1 g/dL (ref 30.0–36.0)
MCV: 97.8 fL (ref 80.0–100.0)
Platelets: 281 10*3/uL (ref 150–400)
RBC: 3.71 MIL/uL — ABNORMAL LOW (ref 3.87–5.11)
RDW: 14.9 % (ref 11.5–15.5)
WBC: 5.6 10*3/uL (ref 4.0–10.5)
nRBC: 0 % (ref 0.0–0.2)

## 2021-03-09 LAB — BASIC METABOLIC PANEL
Anion gap: 8 (ref 5–15)
BUN: 10 mg/dL (ref 8–23)
CO2: 26 mmol/L (ref 22–32)
Calcium: 8.6 mg/dL — ABNORMAL LOW (ref 8.9–10.3)
Chloride: 97 mmol/L — ABNORMAL LOW (ref 98–111)
Creatinine, Ser: 0.41 mg/dL — ABNORMAL LOW (ref 0.44–1.00)
GFR, Estimated: >60 mL/min (ref >60)
Glucose, Bld: 90 mg/dL (ref 70–99)
Potassium: 3.8 mmol/L (ref 3.5–5.1)
Sodium: 131 mmol/L — ABNORMAL LOW (ref 135–145)

## 2021-03-09 MED ORDER — PROSOURCE PLUS PO LIQD
30.0000 mL | Freq: Two times a day (BID) | ORAL | Status: DC
  Administered 2021-03-10 – 2021-03-11 (×2): 30 mL via ORAL
  Filled 2021-03-09 (×3): qty 30

## 2021-03-09 NOTE — Progress Notes (Signed)
Physical Therapy Session Note  Patient Details  Name: Angela Morales MRN: [id number] Date of Birth: 04/17/1946  Today's Date: 03/09/2021 PT Individual Time: 1155-2080 PT Individual Time Calculation (min): 60 min   Short Term Goals: Week 1:  PT Short Term Goal 1 (Week 1): Pt will transfer sidelying to sit w/ min A. PT Short Term Goal 2 (Week 1): Pt will transfer sit to stand w/ max A PT Short Term Goal 3 (Week 1): Pt will negotiate w/c x 30' w/ min A. PT Short Term Goal 4 (Week 1): Pt will transfer bed<> w/cc w/ SB and mod A  Skilled Therapeutic Interventions/Progress Updates:    Pt received seated in TIS chair in room, agreeable to PT session. Pt reports pain in R lower back, sacral, and hip area. Pt premedicated prior to start of therapy session, utilized repositioning for pain management. Slide board transfer with max A w/c to/from mat table and bed during session. Pt requires max cueing for head/hips relationship during each transfer. Session focus on sitting balance and core strengthening EOM. Seated unweighted ball punch-outs x 10 reps (easy) with progression to OH lifts with ball x 10 reps (difficult). Seated L/R diagonal ball lifts x 10 reps each direction (difficult). Seated rebounder ball toss 3 x 10 reps to fatigue with CGA for sitting balance. Pt tends to fall posteriorly with LOB but able to correct with CGA cueing. Seated reaching outside BOS and across midline for bean bags with CGA for sitting balance. Pt has increase in sacral and hip pain with reaching laterally and forwards with RUE, deferred reaching these directions due to pain. Attempt to have pt perform crunches from therapy ball for core strengthening, has increase in pain when attempting to lean posteriorly. Pt requests to return to bed at end of session. Sit to supine mod A for BLE management, pt has decrease in pain with transfer with assist bringing knees towards chest. Assisted pt with doffing tennis shoes and B ankle  aircasts and donning PRAFOs at bed level dependently. Reviewed purpose of PRAFOs. Pt left seated in bed with needs in reach, bed alarm in place.  Therapy Documentation Precautions:  Precautions Precautions: Fall, Other (comment) Precaution Comments: cortrak; bladder/bowel incontinence, log roll for back pain even PTA Required Braces or Orthoses: Other Brace Other Brace: bilateral ankle air splints Restrictions Weight Bearing Restrictions: No    Therapy/Group: Individual Therapy  Excell Seltzer, PT, DPT, CSRS  03/09/2021, 11:48 AM

## 2021-03-09 NOTE — Progress Notes (Signed)
Calorie Count Note  48 hour calorie count ordered.  Diet: Gluten free diet with thin liquids Supplements:  Boost Breeze po QID, each supplement provides 250 kcal and 9 grams of protein 30 ml Prosource plus po BID, each supplement provides 100 kcal and 15 grams of protein.   Breakfast: 219 kcal, 12 grams of protein Lunch: 188 kcal, 7 grams of protein Dinner: 157 kcal, 12 grams of protein Supplements: 750 kcal, 36 grams of protein  Total intake: 1314 kcal (73% of kcal needs)  67 grams of protein (74% of protein needs)  Estimated Nutritional Needs:  Kcal:  1800-2000 Protein:  90-105 grams Fluid:  >/= 1.8 L/day  NGT removed yesterday. RD to continue with current nutritional supplements to aid in caloric and protein needs. Pt encouraged to eat her food at meals and to drink her supplements.   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 Provide 30 ml Prosource plus po BID, each supplement provides 100 kcal and 15 grams of protein.   Corrin Parker, MS, RD, LDN RD pager number/after hours weekend pager number on Amion.

## 2021-03-09 NOTE — Progress Notes (Signed)
Occupational Therapy Session Note  Patient Details  Name: Angela Morales MRN: [id number] Date of Birth: April 14, 1946  Today's Date: 03/09/2021 OT Individual Time: 1410-1435 OT Individual Time Calculation (min): 25 min    Short Term Goals: Week 1:  OT Short Term Goal 1 (Week 1): Pt will dons shirt in unsupported sitting with no more than CGA for sitting balance OT Short Term Goal 2 (Week 1): Pt will transfer with LRAD and 1 caregiver to w/c in prep for [facility] transfers OT Short Term Goal 3 (Week 1): Pt will maintain dynamic sitting balnace for 10 min with no more than MIN A to demo improved endurance  Skilled Therapeutic Interventions/Progress Updates:    Pt resting in bed upon arrival. OT intervention with focus on BUE FMC/strengthening. Pt reports difficulty with self feeding. Discussed use of red tubing to facilitate grasp of utensils. Pt reports that she had been using tubing in acute but had been misplaced during transition to rehab. Discussed use of [place] as alternative. Red tubing provided later for use with self feeding. Discussed grasp strength and use of theraputty. Will incorporate into theapy session starting tomorrow. Pt remained in bed with all needs within reach.   Therapy Documentation Precautions:  Precautions Precautions: Fall, Other (comment) Precaution Comments: cortrak; bladder/bowel incontinence, log roll for back pain even PTA Required Braces or Orthoses: Other Brace Other Brace: bilateral ankle air splints Restrictions Weight Bearing Restrictions: No Pain:  Pt reports "I'm comfortable right now."   Therapy/Group: Individual Therapy  Leroy Libman 03/09/2021, 2:44 PM

## 2021-03-09 NOTE — Progress Notes (Signed)
PROGRESS NOTE   Subjective/Complaints:  Pt reports hands more numb this AM- thinks might have slept weird.  Accidentally dumped OJ in bed this AM-  Uncomfortable to gets ankles stretched.  Also having LBP when gets up- taking Oxy 2.5 mg - takes edge off- a couple of times per day.    ROS:  Pt denies SOB, abd pain, CP, N/V/C/D, and vision changes   Objective:   No results found. Recent Labs    03/09/21 0543  WBC 5.6  HGB 12.0  HCT 36.3  PLT 281    Recent Labs    03/09/21 0543  NA 131*  K 3.8  CL 97*  CO2 26  GLUCOSE 90  BUN 10  CREATININE 0.41*  CALCIUM 8.6*     Intake/Output Summary (Last 24 hours) at 03/09/2021 0810 Last data filed at 03/09/2021 0800 Gross per 24 hour  Intake 940 ml  Output 500 ml  Net 440 ml        Physical Exam: Vital Signs Blood pressure 135/72, pulse 80, temperature 98.2 F (36.8 C), temperature source Oral, resp. rate 16, height 5' 4"  (1.626 m), weight 63.2 kg, last menstrual period 04/20/2005, SpO2 98 %.    General: awake, alert, appropriate, sitting up in bed; OT at bedside; NAD HENT: conjugate gaze; oropharynx moist; trach site covered- no air leak CV: regular rate; no JVD Pulmonary: CTA B/L; no W/R/R- good air movement GI: soft, NT, ND, (+)BS Psychiatric: appropriate Neurological: Ox3  Ext: no clubbing, cyanosis, or edema Psych: pleasant and cooperative  Neurological: alert  alert Musculoskeletal:     Cervical back: Normal range of motion and neck supple.     Comments: No edema or tenderness in extremities  Skin:    General: Skin is warm and dry.     Comments: Neck dressing remains CDI  Neurological:     Mental Status: She is alert.     Comments: Alert and oriented Motor: Bilateral upper extremities: 4+/5 proximal distal Bilateral lower extremities: Hip flexion, knee extension 3/5, feet in ankle PF at rest- DF 2/5 B/L- can get to 90 degrees with passive  ROM- however uncomfortable for pt. Sensation diminished to light touch distal to knees as well as fingertips--no substantial change 9/11    Assessment/Plan: 1. Functional deficits which require 3+ hours per day of interdisciplinary therapy in a comprehensive inpatient rehab setting. Physiatrist is providing close team supervision and 24 hour management of active medical problems listed below. Physiatrist and rehab team continue to assess barriers to discharge/monitor patient progress toward functional and medical goals  Care Tool:  Bathing    Body parts bathed by patient: Right arm, Left arm, Face, Chest, Abdomen, Front perineal area, Right upper leg, Left upper leg   Body parts bathed by helper: Buttocks, Right lower leg, Left lower leg     Bathing assist Assist Level: 2 Helpers     Upper Body Dressing/Undressing Upper body dressing   What is the patient wearing?: Pull over shirt    Upper body assist Assist Level: Moderate Assistance - Patient 50 - 74%    Lower Body Dressing/Undressing Lower body dressing      What is the  patient wearing?: Incontinence brief, Pants     Lower body assist Assist for lower body dressing: Total Assistance - Patient < 25%     Toileting Toileting    Toileting assist Assist for toileting: Dependent - Patient 0%     Transfers Chair/bed transfer  Transfers assist     Chair/bed transfer assist level: Maximal Assistance - Patient 25 - 49%     Locomotion Ambulation   Ambulation assist   Ambulation activity did not occur: Safety/medical concerns          Walk 10 feet activity   Assist  Walk 10 feet activity did not occur: Safety/medical concerns        Walk 50 feet activity   Assist Walk 50 feet with 2 turns activity did not occur: Safety/medical concerns         Walk 150 feet activity   Assist Walk 150 feet activity did not occur: Safety/medical concerns         Walk 10 feet on uneven surface   activity   Assist Walk 10 feet on uneven surfaces activity did not occur: Safety/medical concerns         Wheelchair     Assist Is the patient using a wheelchair?: Yes Type of Wheelchair: Manual Wheelchair activity did not occur: Safety/medical concerns         Wheelchair 50 feet with 2 turns activity    Assist    Wheelchair 50 feet with 2 turns activity did not occur: Safety/medical concerns       Wheelchair 150 feet activity     Assist  Wheelchair 150 feet activity did not occur: Safety/medical concerns       Blood pressure 135/72, pulse 80, temperature 98.2 F (36.8 C), temperature source Oral, resp. rate 16, height 5' 4"  (1.626 m), weight 63.2 kg, last menstrual period 04/20/2005, SpO2 98 %.      Medical Problem List and Plan: 1.  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              -patient may shower with neck covered             -ELOS/Goals: 20-24 days/min a           -con't PT, OT and SLP- Con't CIR. Will order PRAFO's for night time- Prevalon's not doing enough.  2.  Antithrombotics: -DVT/anticoagulation:  Pharmaceutical: Other (comment)--Eliquis bid             -antiplatelet therapy: -- N/A 3. Scoliosis/Pain Management: Lidocaine patches. Gabapentin 200 mg/HS per Dr. Saintclair Halsted.               -low dose oxycodone prn  9/11 pain levels stable--discussed that her neuropathic pain could wax and wane  9/12- taking Oxy 2.5 mg 1-2x/day- suggested at least 3x/day- for therapy and night.  4. Mood:  LCSW to follow for evaluation and support.              -antipsychotic agents: N/A 5. Neuropsych: This patient is capable of making decisions on her own behalf. 6. Skin/Wound Care: Routine pressure relief measures.  7. Fluids/Electrolytes/Nutrition: Monitor I/Os--feels full "food being shoved at her all the time."   9/11 appreciate RD input re: nutrition, food choices, etc, calorie ct -- Continue K  dur for now.  -she's eating well. Weight generally trending up -dc NGT 8. A fib: Monitor HR TID--amiodarone decreased to once daily on 09/06             --  metoprolol d/c due to hypotension             Monitor with increased activity 9. Urinary retention: Schedule toileting--will monitor voiding with PVR checks.              --urecholine increased to 20 mg TID on 09/07-->monitor for orthostasis 9/8-  t Flomax 0.4 mg QHS- not allergic to Sulfa.  9/11 retaining 350cc or less, continue urecholine 86m TID 10. Dysphagia: on regular diet, gluten free    11. Hypoxic hypercarbic respiratory failure: Continue aspiration precautions.   9/8- trach out 2 days ago- trach site healing- con't to monitor  9/12- no air leak- off O2- doing well- con't to monitor 12. Hyponatremia: Na improving from nadir of 115-->127/130 for the past week.              --9/8- Na up to 131- will con't to monitor 13.  Sleep disturbance: Continue Seroquel and melatonin             --need to cautious re: uptiration-->hypotension  --Wean as tolerated 14. Hyperglycemia:  Hgb a1C-5.0-->likely due to tube feeds and supplement between meals.             ---will monitor CBG X 48 hours.    -cbg's normal---dc'ed 91/0  LOS: 5 days A FACE TO FACE EVALUATION WAS PERFORMED  Wess Baney 03/09/2021, 8:10 AM

## 2021-03-09 NOTE — Progress Notes (Signed)
Occupational Therapy Session Note  Patient Details  Name: Angela Morales MRN: [id number] Date of Birth: 1945-08-08  Today's Date: 03/09/2021 OT Individual Time: 0700-0810 OT Individual Time Calculation (min): 70 min    Short Term Goals: Week 1:  OT Short Term Goal 1 (Week 1): Pt will dons shirt in unsupported sitting with no more than CGA for sitting balance OT Short Term Goal 2 (Week 1): Pt will transfer with LRAD and 1 caregiver to w/c in prep for [facility] transfers OT Short Term Goal 3 (Week 1): Pt will maintain dynamic sitting balnace for 10 min with no more than MIN A to demo improved endurance  Skilled Therapeutic Interventions/Progress Updates:    Pt resting in bed upon arrival and agreeable to therapy. OT intervention with focus on bed mobility, BADL retraining at bed level, SB tranfser, sitting balance, activity tolerance and safety awareness to increase indepdnence with BADLs. Rolling r/l in bed with mod A using bed rails. Pt completed UB bathing/dressing at bed level with HOB elevated. Pt able to use bed rails to sit in upright position to facilitate washing back and pulling shirt over back. Pt washed front perineal area and Bil upper legs at bed level. LB dressing with tot A rolling R/L in bed. Tot A for donning Bil air splints on feet and donning shoes. Supine>sit EOB with tot A. Sitting balance EOB with max A. SB transfer to w/c with tot A+2. Pt completed grooming tasks seated in TIS w/c at sink. Pt remained in w/c with all needs within reach.   Therapy Documentation Precautions:  Precautions Precautions: Fall, Other (comment) Precaution Comments: cortrak; bladder/bowel incontinence, log roll for back pain even PTA Required Braces or Orthoses: Other Brace Other Brace: bilateral ankle air splints Restrictions Weight Bearing Restrictions: No Pain: Pt c/o chronic back pain (unrated); RN admin meds during session and repositioned  Therapy/Group: Individual Therapy  Leroy Libman 03/09/2021, 8:16 AM

## 2021-03-09 NOTE — Progress Notes (Signed)
Orthopedic Tech Progress Note Patient Details:  Angela Morales 10-24-1945 [id number] Called in order to [employer] for a BLE PRAFO BOOTS Patient ID: [name], female   DOB: 1945/12/13, 75 y.o.   MRN: [id number]  [name] 03/09/2021, 8:26 AM

## 2021-03-10 MED ORDER — TAMSULOSIN HCL 0.4 MG PO CAPS
0.8000 mg | ORAL_CAPSULE | Freq: Every day | ORAL | Status: DC
  Administered 2021-03-10 – 2021-03-12 (×3): 0.8 mg via ORAL
  Filled 2021-03-10 (×3): qty 2

## 2021-03-10 MED ORDER — OXYCODONE HCL 5 MG PO TABS
2.5000 mg | ORAL_TABLET | Freq: Every day | ORAL | Status: DC
  Administered 2021-03-10 – 2021-03-12 (×3): 2.5 mg via ORAL
  Filled 2021-03-10 (×2): qty 1

## 2021-03-10 NOTE — Progress Notes (Signed)
Occupational Therapy Session Note  Patient Details  Name: Angela Morales MRN: [id number] Date of Birth: December 28, 1945  Today's Date: 03/10/2021 OT Individual Time: 0700-0808 OT Individual Time Calculation (min): 68 min    Short Term Goals: Week 1:  OT Short Term Goal 1 (Week 1): Pt will dons shirt in unsupported sitting with no more than CGA for sitting balance OT Short Term Goal 2 (Week 1): Pt will transfer with LRAD and 1 caregiver to w/c in prep for [facility] transfers OT Short Term Goal 3 (Week 1): Pt will maintain dynamic sitting balnace for 10 min with no more than MIN A to demo improved endurance  Skilled Therapeutic Interventions/Progress Updates:    Pt resting in bed upon arrival and ready to get started with therapy. OT intervention with focus on bed mobility, including supine>sit, sitting balance EOB, SB transfers, bathing/dressing at bed level, activity tolerance, and safety awareness to increase independence with BADLs. Rolling R/L in bed with mod A using bed rails. Bathing with mod A at bed level. UB dressing with min A at bed level with HOB elevated. Pt dependent for LB dressing, including air splints and shoes. Pt with increased BLE tone when hip flexion/knee flexion initiated. Pt reports "shocking jolt" sensation in BLE with certain positions. SB transfer to w/c with max A+2. Pt completed grooming w/c level at sink. Pt required assistance to remove top on toothpaste. Pt remained seated in TIS w/c with all needs within reach.   Therapy Documentation Precautions:  Precautions Precautions: Fall, Other (comment) Precaution Comments: cortrak; bladder/bowel incontinence, log roll for back pain even PTA Required Braces or Orthoses: Other Brace Other Brace: bilateral ankle air splints Restrictions Weight Bearing Restrictions: No   Pain:  Pt c/o increase back pain in unsupported sitting EOB; repositioned   Therapy/Group: Individual Therapy  Leroy Libman 03/10/2021, 8:12  AM

## 2021-03-10 NOTE — Progress Notes (Signed)
Patient ID: Angela Morales, female   DOB: 20-Nov-1945, 75 y.o.   MRN: [id number]  SW met with pt and pt brother in room to provide updates from team conference, and d/c pending at this time. Pt reminded on gains she has made, and will have more updates next week.   1342- SW left message for pt husband Angela Morales [phone]) and waiting on follow-up to discuss updates from team conference.    Loralee Pacas, MSW, [place] Office: [phone] Cell: [phone] Fax: [phone]

## 2021-03-10 NOTE — Progress Notes (Signed)
Occupational Therapy Session Note  Patient Details  Name: Angela Morales MRN: [id number] Date of Birth: 05-22-1946  Today's Date: 03/10/2021 OT Individual Time: 1130-1156 OT Individual Time Calculation (min): 26 min    Short Term Goals: Week 1:  OT Short Term Goal 1 (Week 1): Pt will dons shirt in unsupported sitting with no more than CGA for sitting balance OT Short Term Goal 2 (Week 1): Pt will transfer with LRAD and 1 caregiver to w/c in prep for [facility] transfers OT Short Term Goal 3 (Week 1): Pt will maintain dynamic sitting balnace for 10 min with no more than MIN A to demo improved endurance  Skilled Therapeutic Interventions/Progress Updates:    PT resting in TIS w/c upon arrival. Pt reports that she is sleepy but agreeable to therapy. OT intervention with focus on BUE/hand strengthening and [place]. Pt issued yellow theraputty. Pt educated on theraputty exercises for strengthening. Pt return demonstrated exercises. Pt reports that she has decreased light touch sensation inhibiting picking up small objects and holding objects when not intentionally looking at the object. Ongoing education regarding compensatory strategies. Pt remained in w/c with all needs within reach.   Therapy Documentation Precautions:  Precautions Precautions: Fall, Other (comment) Precaution Comments: cortrak; bladder/bowel incontinence, log roll for back pain even PTA Required Braces or Orthoses: Other Brace Other Brace: bilateral ankle air splints Restrictions Weight Bearing Restrictions: No   Pain: Pt denies pain this morning  Therapy/Group: Individual Therapy  Leroy Libman 03/10/2021, 12:06 PM

## 2021-03-10 NOTE — Progress Notes (Signed)
Physical Therapy Session Note  Patient Details  Name: Angela Morales MRN: [id number] Date of Birth: [birth year]  Today's Date: 03/10/2021 PT Individual Time: 0900-1000; 1445-1530 PT Individual Time Calculation (min): 60 min and 45 min  Short Term Goals: Week 1:  PT Short Term Goal 1 (Week 1): Pt will transfer sidelying to sit w/ min A. PT Short Term Goal 2 (Week 1): Pt will transfer sit to stand w/ max A PT Short Term Goal 3 (Week 1): Pt will negotiate w/c x 30' w/ min A. PT Short Term Goal 4 (Week 1): Pt will transfer bed<> w/cc w/ SB and mod A  Skilled Therapeutic Interventions/Progress Updates:    Session 1: Pt received seated in TIS chair in room, agreeable to PT session. Pt reports pain is well-controlled this AM with pain medication. Dependent transport via TIS to/from therapy gym. Slide board transfer w/c to mat table with max A. Sit to stand with +2 to stedy, multimodal cues for anterior weight shift. Use of mirror in standing for visual feedback for upright posture. Pt exhibits B knee hyperextension and hip/trunk flexion in standing. With multimodal cueing pt able to perform hip and trunk extension. Pt only able to slightly unlock knees in standing with use of stedy platform for support blocking knees. Pt also exhibits B ankle inversion in standing even with use of B aircasts. Pt able to stand x 5 reps for about 15 sec each before onset of fatigue and then unable to stand again. However, pt exhibits improved ability to stand this date as compared to previous trials of standing. Pt does have some onset of lightheadedness in standing that resolves at rest. Seated BP 138/83 following standing activity. Seated core strengthening/balance with CGA performing ball toss and ball bounce, 3 x 30 reps to fatigue. Reviewed scooting L/R on mat table with focus on head/hips relationship. Pt able to scoot herself about 1/2 an inch each direction with min A. Slide board transfer mat table to w/c going  slightly downhill with min A with focus on pt utilizing scooting skills to transfer back into chair. Pt left semi-reclined in TIS chair in room with needs in reach at end of session.  Session 2: Pt received seated in bed, agreeable to PT session. Pt reports pain in her low back and R hip this PM, premedicated prior to start of therapy session but pain more limiting that during AM session. Supine to sit with max A for BLE management and trunk elevation. Assisted pt with donning shoes over B ankle aircasts while seated EOB. Slide board transfer bed to w/c to/from mat table with mod to max A this afternoon. Pt exhibits decreased ability to assist with lateral scooting as compared to AM session. Sit to stand x 2 reps to stedy from elevated mat table with +2. Pt exhibits intermittent B knee buckling and hyperextension, R>L. Use of mirror for visual feedback for posture in standing. Pt returned to bed at end of session, mod A for BLE management. Pt left semi-reclined in bed with needs in reach, bed alarm in place, brother present in room. Provided home measurement sheet for family to complete as pt's brother reports he is taking measurements in the home to plan for d/c.  Therapy Documentation Precautions:  Precautions Precautions: Fall, Other (comment) Precaution Comments: cortrak; bladder/bowel incontinence, log roll for back pain even PTA Required Braces or Orthoses: Other Brace Other Brace: bilateral ankle air splints Restrictions Weight Bearing Restrictions: No     Therapy/Group: Individual  Therapy   Excell Seltzer, PT, DPT, CSRS  03/10/2021, 11:25 AM

## 2021-03-10 NOTE — Patient Care Conference (Signed)
Inpatient RehabilitationTeam Conference and Plan of Care Update Date: 03/10/2021   Time: 11:00 AM    Patient Name: Angela Morales      Medical Record Number: [id number]  Date of Birth: 1945/11/12 Sex: Female         Room/Bed: 4M02C/4M02C-01 Payor Info: Payor: [employer] park [employer] MEDICARE / Plan: [facility] MEDICARE / Product Type: *No Product type* /    Admit Date/Time:  03/04/2021  5:19 PM  Primary Diagnosis:  GBS (Guillain-Barre syndrome) [facility])  Hospital Problems: Principal Problem:   GBS (Guillain-Barre syndrome) ([place])    Expected Discharge Date: Expected Discharge Date:  (pending)  Team Members Present: Physician leading conference: Dr. Courtney Heys Social Worker Present: Loralee Pacas, [place] Nurse Present: Dorthula Nettles, RN PT Present: Excell Seltzer, PT OT Present: Roanna Epley, [place], OT PPS Coordinator present : Gunnar Fusi, SLP     Current Status/Progress Goal Weekly Team Focus  Bowel/Bladder   Incontinent of bowel and bladder. Last BM 9/12  Minimize incontinent episodes  Toilet Q 2 hour and PRN   Swallow/Nutrition/ Hydration             ADL's   bed mobility-mod/max A; LB dressing (bed level)-tot A; UB dressing (bed level)-max A; SB tranfsers max A  min A overall  bed mobility, BADL retraining, functional transfers, core strengthening,   Mobility   mod to max A bed mobility, max A SB transfer, +2 to stand in stedy  min A overall  LE NMR, core strengthening, sitting balance, transfers, standing as safe and able   Communication             Safety/Cognition/ Behavioral Observations            Pain   Reports 6/10 pain to lower back  <3  Assess pain Q shift and PRN.   Skin   Incision to anterior medial neck - dressing in place. G tube - post removal.  Keep sites clean, dry, intact and without s/s of infection.  Assess Q shift and PRN.     Discharge Planning:  Pt to d/c to home with husband who works remotely. Pt needs to be atleast intermittent  supervision at time of discharge. SW will confirm there are no barriers to discharge.   Team Discussion: Lurline Idol site healed. Has Oxycodone 2.5 mg for pain, suggest taking TID. Has urinary retention, 2 I&O caths in past 48 hrs. Will increase Flomax to 0.08. discontinued CBG's, added Prafo's. Nursing doing I&O caths, continent bowel, LBM 9/12. Nursing requesting pain medication scheduled at 0600 due to therapy starting at 0700. Trazodone available for sleep. Neck dressing is CDI. Home with husband who is an Forensic psychologist and works from home. Mod/max assist for bed mobility, +2 with slide board. Currently in tilt-n-space WC, can't tolerate standing for long periods of time. ADL's at bed level, min/mod assist upper body, max/total assist lower body. BLE tight in the AM but loosens up during the day. Patient on target to meet rehab goals: yes  *See Care Plan and progress notes for long and short-term goals.   Revisions to Treatment Plan:  MD discontinued CBG's, added prafo's , increased Flomax, AM pain medications scheduled at 0600 for therapy.   Teaching Needs: Family education, medication management, pain management, bowel/bladder management, skin/wound care, transfer training, gait training, balance training, endurance training, W/C training, safety awareness.  Current Barriers to Discharge: Decreased caregiver support, Medical stability, Home enviroment access/layout, Trach, Neurogenic bowel and bladder, Wound care, Lack of/limited family support, and Medication  compliance  Possible Resolutions to Barriers: Continue current medications, provide emotional support.     Medical Summary Current Status: d/c CBGs; pain meds- suggested taking 3x/day- LBM yesterday- required 2 caths in last 48 hours; less overall; working on timed voiding; trach removed- no air leak- NGT out  Barriers to Discharge: Decreased family/caregiver support;Home enviroment access/layout;Neurogenic Bowel & Bladder;Medical  stability;Trach;Wound care  Barriers to Discharge Comments: home with husband- who's a working attorney Possible Resolutions to [employer]: - mod-max A bed; transfers min-mod A; ADLs max to total A in AM- tight in AM; focus-  increase Flomax to 0.8 mg QHS; d/c SCDs; on Elqiuis; added PRAFOs for B/L foot drop;  pain is limiting- will schedule AM pain meds- d/c- needed 4 weeks on eval- 3-3.5 weeks from now   Continued Need for Acute Rehabilitation Level of Care: The patient requires daily medical management by a physician with specialized training in physical medicine and rehabilitation for the following reasons: Direction of a multidisciplinary physical rehabilitation program to maximize functional independence : Yes Medical management of patient stability for increased activity during participation in an intensive rehabilitation regime.: Yes Analysis of laboratory values and/or radiology reports with any subsequent need for medication adjustment and/or medical intervention. : Yes   I attest that I was present, lead the team conference, and concur with the assessment and plan of the team.   Cristi Loron 03/10/2021, 2:42 PM

## 2021-03-10 NOTE — Progress Notes (Signed)
PROGRESS NOTE   Subjective/Complaints:  Pt reports slept well, but had "crazy dreams"- tired still this AM.  Asking when supposed to wear SCDs, since wearing PRAFO's now at night.     ROS:   Pt denies SOB, abd pain, CP, N/V/C/D, and vision changes    Objective:   No results found. Recent Labs    03/09/21 0543  WBC 5.6  HGB 12.0  HCT 36.3  PLT 281    Recent Labs    03/09/21 0543  NA 131*  K 3.8  CL 97*  CO2 26  GLUCOSE 90  BUN 10  CREATININE 0.41*  CALCIUM 8.6*     Intake/Output Summary (Last 24 hours) at 03/10/2021 0840 Last data filed at 03/10/2021 0746 Gross per 24 hour  Intake 678 ml  Output 600 ml  Net 78 ml        Physical Exam: Vital Signs Blood pressure 121/64, pulse 80, temperature 97.9 F (36.6 C), temperature source Oral, resp. rate 16, height 5' 4"  (1.626 m), weight 63.4 kg, last menstrual period 04/20/2005, SpO2 96 %.     General: awake, alert, appropriate, laying supine in bed; wearing PRAFO's; NAD HENT: conjugate gaze; oropharynx moist; trach site covered- no air leak CV: regular rate; no JVD Pulmonary: CTA B/L; no W/R/R- good air movement GI: soft, NT, ND, (+)BS Psychiatric: appropriate Neurological: alert; Ankle foot drop B/L   Ext: no clubbing, cyanosis, or edema Psych: pleasant and cooperative  Neurological: alert  alert Musculoskeletal:     Cervical back: Normal range of motion and neck supple.     Comments: No edema or tenderness in extremities  Skin:    General: Skin is warm and dry.     Comments: Neck dressing remains CDI  Neurological:     Mental Status: She is alert.     Comments: Alert and oriented Motor: Bilateral upper extremities: 4+/5 proximal distal Bilateral lower extremities: Hip flexion, knee extension 3/5, feet in ankle PF at rest- DF 2/5 B/L- can get to 90 degrees with passive ROM- however uncomfortable for pt. Sensation diminished to light  touch distal to knees as well as fingertips--no substantial change 9/11    Assessment/Plan: 1. Functional deficits which require 3+ hours per day of interdisciplinary therapy in a comprehensive inpatient rehab setting. Physiatrist is providing close team supervision and 24 hour management of active medical problems listed below. Physiatrist and rehab team continue to assess barriers to discharge/monitor patient progress toward functional and medical goals  Care Tool:  Bathing    Body parts bathed by patient: Right arm, Left arm, Chest, Abdomen, Front perineal area, Right upper leg, Left upper leg, Face   Body parts bathed by helper: Buttocks, Right lower leg, Left lower leg     Bathing assist Assist Level: Moderate Assistance - Patient 50 - 74%     Upper Body Dressing/Undressing Upper body dressing   What is the patient wearing?: Pull over shirt    Upper body assist Assist Level: Moderate Assistance - Patient 50 - 74%    Lower Body Dressing/Undressing Lower body dressing      What is the patient wearing?: Incontinence brief, Pants  Lower body assist Assist for lower body dressing: Total Assistance - Patient < 25%     Toileting Toileting    Toileting assist Assist for toileting: Dependent - Patient 0%     Transfers Chair/bed transfer  Transfers assist     Chair/bed transfer assist level: Maximal Assistance - Patient 25 - 49%     Locomotion Ambulation   Ambulation assist   Ambulation activity did not occur: Safety/medical concerns          Walk 10 feet activity   Assist  Walk 10 feet activity did not occur: Safety/medical concerns        Walk 50 feet activity   Assist Walk 50 feet with 2 turns activity did not occur: Safety/medical concerns         Walk 150 feet activity   Assist Walk 150 feet activity did not occur: Safety/medical concerns         Walk 10 feet on uneven surface  activity   Assist Walk 10 feet on uneven  surfaces activity did not occur: Safety/medical concerns         Wheelchair     Assist Is the patient using a wheelchair?: Yes Type of Wheelchair: Manual Wheelchair activity did not occur: Safety/medical concerns         Wheelchair 50 feet with 2 turns activity    Assist    Wheelchair 50 feet with 2 turns activity did not occur: Safety/medical concerns       Wheelchair 150 feet activity     Assist  Wheelchair 150 feet activity did not occur: Safety/medical concerns       Blood pressure 121/64, pulse 80, temperature 97.9 F (36.6 C), temperature source Oral, resp. rate 16, height 5' 4"  (1.626 m), weight 63.4 kg, last menstrual period 04/20/2005, SpO2 96 %.      Medical Problem List and Plan: 1.  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              -patient may shower with neck covered             -ELOS/Goals: 20-24 days/min a           -Continue CIR- PT, OT and SLP 2.  Antithrombotics: -DVT/anticoagulation:  Pharmaceutical: Other (comment)--Eliquis bid 9/13- will stop SCDs since on Eliquis, and cannot wear with PRAFO's.              -antiplatelet therapy: -- N/A 3. Scoliosis/Pain Management: Lidocaine patches. Gabapentin 200 mg/HS per Dr. Saintclair Halsted.               -low dose oxycodone prn  9/11 pain levels stable--discussed that her neuropathic pain could wax and wane  9/12- taking Oxy 2.5 mg 1-2x/day- suggested at least 3x/day- for therapy and night.  4. Mood:  LCSW to follow for evaluation and support.              -antipsychotic agents: N/A 5. Neuropsych: This patient is capable of making decisions on her own behalf. 6. Skin/Wound Care: Routine pressure relief measures.  7. Fluids/Electrolytes/Nutrition: Monitor I/Os--feels full "food being shoved at her all the time."   9/11 appreciate RD input re: nutrition, food choices, etc, calorie ct -- Continue K dur for now.  -she's eating well. Weight  generally trending up -dc NGT 9/13- eating well- con't regimen- ate >50% breakfast 8. A fib: Monitor HR TID--amiodarone decreased to once daily on 09/06             --  metoprolol d/c due to hypotension             Monitor with increased activity 9. Urinary retention: Schedule toileting--will monitor voiding with PVR checks.              --urecholine increased to 20 mg TID on 09/07-->monitor for orthostasis 9/8-  t Flomax 0.4 mg QHS- not allergic to Sulfa.  9/11 retaining 350cc or less, continue urecholine 23m TID  9/13- has had 2 caths in last 48 hours- con't bladder scans for intermittent caths- will increase Flomax to 0.8 mg QHS 10. Dysphagia: on regular diet, gluten free    11. Hypoxic hypercarbic respiratory failure: Continue aspiration precautions.   9/8- trach out 2 days ago- trach site healing- con't to monitor  9/12- no air leak- off O2- doing well- con't to monitor 12. Hyponatremia: Na improving from nadir of 115-->127/130 for the past week.              --9/8- Na up to 131- will con't to monitor 13.  Sleep disturbance: Continue Seroquel and melatonin             --need to cautious re: uptiration-->hypotension  --Wean as tolerated 14. Hyperglycemia:  Hgb a1C-5.0-->likely due to tube feeds and supplement between meals.             ---will monitor CBG X 48 hours.    -cbg's normal---dc'ed 91/0 LOS: 6 days A FACE TO FACE EVALUATION WAS PERFORMED  Angela Morales 03/10/2021, 8:40 AM

## 2021-03-11 DIAGNOSIS — E871 Hypo-osmolality and hyponatremia: Secondary | ICD-10-CM

## 2021-03-11 DIAGNOSIS — G479 Sleep disorder, unspecified: Secondary | ICD-10-CM

## 2021-03-11 DIAGNOSIS — G894 Chronic pain syndrome: Secondary | ICD-10-CM

## 2021-03-11 MED ORDER — QUETIAPINE FUMARATE 25 MG PO TABS: 12.5000 mg | ORAL_TABLET | Freq: Every day | ORAL | Status: DC

## 2021-03-11 NOTE — Progress Notes (Signed)
Physical Therapy Session Note  Patient Details  Name: Angela Morales MRN: [id number] Date of Birth: March 15, 1946  Today's Date: 03/11/2021 PT Individual Time: 1306-1350 PT Individual Time Calculation (min): 44 min   Short Term Goals: Week 1:  PT Short Term Goal 1 (Week 1): Pt will transfer sidelying to sit w/ min A. PT Short Term Goal 2 (Week 1): Pt will transfer sit to stand w/ max A PT Short Term Goal 3 (Week 1): Pt will negotiate w/c x 30' w/ min A. PT Short Term Goal 4 (Week 1): Pt will transfer bed<> w/cc w/ SB and mod A  Skilled Therapeutic Interventions/Progress Updates:    Pt received tilted back in TIS w/c and agreeable to therapy session. Pt already wearing B LE air casts. Transported to/from gym in w/c for time management and energy conservation.   Block practice slide board transfers w/c<>EOM 2x with max assist for board placement (able to place board more easily on mat where pt has more room to laterally weight shift) and min/light mod assist for scooting - pt able to recall head/hips relationship without cuing - demos ability to slightly lift hips off board while scooting and demos several small, but effective scoots to complete transfer - requires cuing/manual facilitation to reposition feet during transfer to avoid ankle inversion biased position.  Sitting EOM performed the following B UE, B LE, and core strengthening exercises: - scapular rows x15 reps against level 1 theraband resistance - seated Russian twists with 1kg weighted ball - long arc quads to external target to promote increased knee extension (weakness greater on L LE compared to R)  Returned to room and assisted pt via slide board transfer to bed as pt incontinent and needing assist for hygiene. Sit>R sidelying>supine via reverse logroll technique with mod assist for B LE management onto bed. Pt left supine in bed with nursing staff notified and aware of pt's need for assistance with personal  hygiene.  Therapy Documentation Precautions:  Precautions Precautions: Fall, Other (comment) Precaution Comments: cortrak; bladder/bowel incontinence, log roll for back pain even PTA Required Braces or Orthoses: Other Brace Other Brace: bilateral ankle air splints Restrictions Weight Bearing Restrictions: No  Pain: Reports she was having back pain from sitting in TIS w/c upon therapist arrival. During exercises reports some R posterior hip pain but tolerable during session and providing rest breaks for pain management.    Therapy/Group: Individual Therapy  Tawana Scale , PT, DPT, NCS, CSRS  03/11/2021, 12:22 PM

## 2021-03-11 NOTE — Progress Notes (Signed)
PROGRESS NOTE   Subjective/Complaints: Patient seen sitting up in bed this AM. She states she slept well overnight.  She states she wants her "legs to work".  She does note a "hangover" from sleep medication.  ROS: Denies CP, SOB, N/V/D  Objective:   No results found. Recent Labs    03/09/21 0543  WBC 5.6  HGB 12.0  HCT 36.3  PLT 281     Recent Labs    03/09/21 0543  NA 131*  K 3.8  CL 97*  CO2 26  GLUCOSE 90  BUN 10  CREATININE 0.41*  CALCIUM 8.6*      Intake/Output Summary (Last 24 hours) at 03/11/2021 1009 Last data filed at 03/11/2021 0745 Gross per 24 hour  Intake 295 ml  Output --  Net 295 ml         Physical Exam: Vital Signs Blood pressure (!) 96/55, pulse 83, temperature (!) 97.4 F (36.3 C), temperature source Oral, resp. rate 18, height 5' 4"  (1.626 m), weight 63.2 kg, last menstrual period 04/20/2005, SpO2 96 %. Constitutional: No distress . Vital signs reviewed. HENT: Normocephalic.  Atraumatic. Neck: Dressing CDI Eyes: EOMI. No discharge. Cardiovascular: No JVD.  RRR. Respiratory: Normal effort.  No stridor.  Bilateral clear to auscultation. GI: Non-distended.  BS +. Skin: Warm and dry.  Intact. Psych: Normal mood.  Normal behavior. Musc: No edema in extremities.  No tenderness in extremities. Neuro: Alert and oriented Motor: Bilateral upper extremities: 4+/5 proximal to distal Bilateral lower extremities: Hip flexion, knee extension to -/5, ankle dorsiflexion 1+/5 Sensation diminished to light touch distal to knees as well as fingertips, unchanged   Assessment/Plan: 1. Functional deficits which require 3+ hours per day of interdisciplinary therapy in a comprehensive inpatient rehab setting. Physiatrist is providing close team supervision and 24 hour management of active medical problems listed below. Physiatrist and rehab team continue to assess barriers to discharge/monitor  patient progress toward functional and medical goals  Care Tool:  Bathing    Body parts bathed by patient: Right arm, Left arm, Chest, Abdomen, Right upper leg, Left upper leg, Front perineal area, Face   Body parts bathed by helper: Buttocks, Right lower leg, Left lower leg     Bathing assist Assist Level: Minimal Assistance - Patient > 75%     Upper Body Dressing/Undressing Upper body dressing   What is the patient wearing?: Pull over shirt    Upper body assist Assist Level: Set up assist    Lower Body Dressing/Undressing Lower body dressing      What is the patient wearing?: Incontinence brief, Pants     Lower body assist Assist for lower body dressing: Total Assistance - Patient < 25%     Toileting Toileting    Toileting assist Assist for toileting: Dependent - Patient 0%     Transfers Chair/bed transfer  Transfers assist     Chair/bed transfer assist level: Maximal Assistance - Patient 25 - 49%     Locomotion Ambulation   Ambulation assist   Ambulation activity did not occur: Safety/medical concerns          Walk 10 feet activity   Assist  Walk 10  feet activity did not occur: Safety/medical concerns        Walk 50 feet activity   Assist Walk 50 feet with 2 turns activity did not occur: Safety/medical concerns         Walk 150 feet activity   Assist Walk 150 feet activity did not occur: Safety/medical concerns         Walk 10 feet on uneven surface  activity   Assist Walk 10 feet on uneven surfaces activity did not occur: Safety/medical concerns         Wheelchair     Assist Is the patient using a wheelchair?: Yes Type of Wheelchair: Manual Wheelchair activity did not occur: Safety/medical concerns         Wheelchair 50 feet with 2 turns activity    Assist    Wheelchair 50 feet with 2 turns activity did not occur: Safety/medical concerns       Wheelchair 150 feet activity     Assist   Wheelchair 150 feet activity did not occur: Safety/medical concerns         Medical Problem List and Plan: 1.  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   Continue CIR 2.  Antithrombotics: -DVT/anticoagulation:  Pharmaceutical: Other (comment)--Eliquis bid Stopped SCDs since on Eliquis, and cannot wear with PRAFO's.              -antiplatelet therapy: -- N/A 3. Scoliosis/Pain Management: Lidocaine patches. Gabapentin 200 mg/HS per Dr. Saintclair Halsted.               -low dose oxycodone prn Controlled with meds on 9/14  4. Mood:  LCSW to follow for evaluation and support.              -antipsychotic agents: See #13 5. Neuropsych: This patient is capable of making decisions on her own behalf. 6. Skin/Wound Care: Routine pressure relief measures.  7. Fluids/Electrolytes/Nutrition: Monitor I/Os--feels full "food being shoved at her all the time."   9/11 appreciate RD input re: nutrition, food choices, etc, calorie ct -- Continue K dur for now.  -she's eating well. Weight generally trending up -dc NGT 8. A fib: Monitor HR TID--amiodarone decreased to once daily on 09/06             --metoprolol d/c due to hypotension             Monitor with increased activity 9. Urinary retention: Schedule toileting--will monitor voiding with PVR checks.              --urecholine increased to 20 mg TID on 09/07-->monitor for orthostasis 9/8-  t Flomax 0.4 mg QHS- not allergic to Sulfa, increased to 0.8 on 9/13 9/11 retaining 350cc or less, continue urecholine 79m TID 10. Dysphagia: on regular diet, gluten free   11. Hypoxic hypercarbic respiratory failure: Continue aspiration precautions.   9/8- trach out 2 days ago- trach site healing- con't to monitor  9/12- no air leak- off O2- doing well- con't to monitor 12. Hyponatremia: Na improving from nadir of 115             Sodium 131 on 9/12  Continue to monitor 13.  Sleep disturbance:  Seroquel DC'd on  9/14 due to morning lethargy Continue melatonin             --need to cautious re: uptiration-->hypotension  --Wean as tolerated 14. Hyperglycemia:  Hgb a1C-5.0-->likely due to tube feeds and supplement between meals.             -  will monitor CBG X 48 hours.    -cbg's normal---dc'ed 91/0  LOS: 7 days A FACE TO FACE EVALUATION WAS PERFORMED  Auriana Scalia Lorie Phenix 03/11/2021, 10:09 AM

## 2021-03-11 NOTE — Progress Notes (Signed)
Occupational Therapy Session Note  Patient Details  Name: Angela Morales MRN: [id number] Date of Birth: [birth year]  Today's Date: 03/11/2021 OT Individual Time: 0700-0810 OT Individual Time Calculation (min): 70 min    Short Term Goals: Week 1:  OT Short Term Goal 1 (Week 1): Pt will dons shirt in unsupported sitting with no more than CGA for sitting balance OT Short Term Goal 2 (Week 1): Pt will transfer with LRAD and 1 caregiver to w/c in prep for [facility] transfers OT Short Term Goal 3 (Week 1): Pt will maintain dynamic sitting balnace for 10 min with no more than MIN A to demo improved endurance  Skilled Therapeutic Interventions/Progress Updates:    Pt resting in bed upon arrival and ready to start her day. OT intervention with focus on bed mobility, LB bathing/dressing at bed level, UB bathing/dressing at w/c level, sitting balance, SB tranfsers, and activity tolerance to increase independence with BADLs. Rolling R/L in bed with min A using bed rails. LB bathing with mod A and LB dressing with tot A. Pt dependent for donning socks, air splints, and shoes. Supine>sit EOB with mod A in preparation for SB transfer to w/c. SB tranfser with mod A to w/c (increased assistance 2/2 lateral support back rest on w/c). UB bathing/dressing at w/c level with supervision/set up. Pt remained in w/c with all needs within reach.   Therapy Documentation Precautions:  Precautions Precautions: Fall, Other (comment) Precaution Comments: cortrak; bladder/bowel incontinence, log roll for back pain even PTA Required Braces or Orthoses: Other Brace Other Brace: bilateral ankle air splints Restrictions Weight Bearing Restrictions: No  Pain:  Pt states she received pain meds at 0630; denies pain this morning   Therapy/Group: Individual Therapy  Leroy Libman 03/11/2021, 8:15 AM

## 2021-03-11 NOTE — Progress Notes (Signed)
Occupational Therapy Session Note  Patient Details  Name: Angela Morales MRN: [id number] Date of Birth: 28-Jun-1946  Today's Date: 03/11/2021 OT Individual Time: 1100-1155 OT Individual Time Calculation (min): 55 min    Short Term Goals: Week 1:  OT Short Term Goal 1 (Week 1): Pt will dons shirt in unsupported sitting with no more than CGA for sitting balance OT Short Term Goal 2 (Week 1): Pt will transfer with LRAD and 1 caregiver to w/c in prep for [facility] transfers OT Short Term Goal 3 (Week 1): Pt will maintain dynamic sitting balnace for 10 min with no more than MIN A to demo improved endurance  Skilled Therapeutic Interventions/Progress Updates:    Pt resting in w/c upon arrival and agreeable to therapy. Pt transported for time mgmt to day room for table activities with focus on BUE/hand strengthening and dexterity. 9 hole peg test: Rt-50.10 secs, Lt-42.7 secs. Pt issued yellow theraputty and handout for activities. Pt completed 14 activities with rest breaks 2/2 muscle fatigue/pain. Pt challenged with removing 10 small beads from putty and replacing.Pt also practiced removing/replacing lids/caps on various medicine bottles. Pt returned to room and remained in w/c with all needs within reach.   Therapy Documentation Precautions:  Precautions Precautions: Fall, Other (comment) Precaution Comments: cortrak; bladder/bowel incontinence, log roll for back pain even PTA Required Braces or Orthoses: Other Brace Other Brace: bilateral ankle air splints Restrictions Weight Bearing Restrictions: No  Pain: Pt reports she is "ok"   Therapy/Group: Individual Therapy  Leroy Libman 03/11/2021, 12:09 PM

## 2021-03-11 NOTE — Progress Notes (Signed)
Physical Therapy Session Note  Patient Details  Name: Angela Morales MRN: [id number] Date of Birth: [birth year]  Today's Date: 03/11/2021 PT Individual Time: 1405-1430 PT Individual Time Calculation (min): 25 min   Short Term Goals: Week 1:  PT Short Term Goal 1 (Week 1): Pt will transfer sidelying to sit w/ min A. PT Short Term Goal 2 (Week 1): Pt will transfer sit to stand w/ max A PT Short Term Goal 3 (Week 1): Pt will negotiate w/c x 30' w/ min A. PT Short Term Goal 4 (Week 1): Pt will transfer bed<> w/cc w/ SB and mod A  Skilled Therapeutic Interventions/Progress Updates:    Pt in care of nsg on arrival to perform brief change and bladder scan. Therapist retrieved new roho cushion after being informed that pt's was soiled. Pt then directed in bridging activity,3 x 10, for improved LE strength and endurance for functional mobility. Donned air casts to assist with ankle control and therapist stabilized pt's BLE so she could perform the activity. Cueing for glute engagement throughout. Pt remained in bed at end of session and was left with all needs in reach and alarm active.   Therapy Documentation Precautions:  Precautions Precautions: Fall, Other (comment) Precaution Comments: cortrak; bladder/bowel incontinence, log roll for back pain even PTA Required Braces or Orthoses: Other Brace Other Brace: bilateral ankle air splints Restrictions Weight Bearing Restrictions: No     Therapy/Group: Individual Therapy  Mickel Fuchs 03/11/2021, 4:01 PM

## 2021-03-12 ENCOUNTER — Other Ambulatory Visit: Payer: Self-pay

## 2021-03-12 DIAGNOSIS — R339 Retention of urine, unspecified: Secondary | ICD-10-CM

## 2021-03-12 MED ORDER — GABAPENTIN 400 MG PO CAPS
400.0000 mg | ORAL_CAPSULE | Freq: Every day | ORAL | Status: DC
  Administered 2021-03-12: 400 mg via ORAL
  Filled 2021-03-12: qty 1

## 2021-03-12 MED ORDER — SODIUM CHLORIDE 0.9 % IV BOLUS
500.0000 mL | Freq: Once | INTRAVENOUS | Status: AC
  Administered 2021-03-13: 500 mL via INTRAVENOUS

## 2021-03-12 NOTE — Progress Notes (Signed)
Physical Therapy Session Note  Patient Details  Name: Angela Morales MRN: [id number] Date of Birth: 08-26-1945  Today's Date: 03/12/2021 PT Individual Time: 1021-1120 PT Individual Time Calculation (min): 59 min   Short Term Goals: Week 1:  PT Short Term Goal 1 (Week 1): Pt will transfer sidelying to sit w/ min A. PT Short Term Goal 2 (Week 1): Pt will transfer sit to stand w/ max A PT Short Term Goal 3 (Week 1): Pt will negotiate w/c x 30' w/ min A. PT Short Term Goal 4 (Week 1): Pt will transfer bed<> w/cc w/ SB and mod A  Skilled Therapeutic Interventions/Progress Updates: Pt presented in TIS agreeable to therapy. Pt states continues to have in pain B feet but better than earlier in am. Pt transported to rehab gym and performed SB transfer to R with PTA providing total A for SB placement and pt performing transfer with minA and facilitation of head/hips relationship. Pt then participated in seated therex for core strengthening and incorporating dynamic balance. Pt performed trunk rotations with 1Kg ball x 10 each direction and diagonals (chops) with .5Kg 2 x 5. Pt also performed ball taps with 1lb dowel 2 x 10. Pt did require intermittent verbal cues to return to midline with this task due to increased posterior lean. Pt also participated in several bouts of horseshoes in sitting using BUE incorporating reaching, crossing midline, high/low. Pt performed SB transfer to TIS in same manner as prior. Pt transported back to room and performed SB transfer to bed with mod near minA as slight uphill. Pt required modA for sit to supine and was able to perform lateral segmental bridges to reposition. Pt then indicated episode of incontinence. PTA left pt in bed with alarm on, call bell within reach and NT and nurse notified of pt's disposition.      Therapy Documentation Precautions:  Precautions Precautions: Fall, Other (comment) Precaution Comments: cortrak; bladder/bowel incontinence, log roll  for back pain even PTA Required Braces or Orthoses: Other Brace Other Brace: bilateral ankle air splints Restrictions Weight Bearing Restrictions: (P) No   Therapy/Group: Individual Therapy  Geraldy Akridge Doralyn Kirkes, PTA  03/12/2021, 12:30 PM

## 2021-03-12 NOTE — Progress Notes (Signed)
PROGRESS NOTE   Subjective/Complaints: Patient seen sitting up in bed this AM.  She states she did not sleep well overnight due to burning in her LE that started at 2AM.   ROS: Denies CP, SOB, N/V/D  Objective:   No results found. No results for input(s): WBC, HGB, HCT, PLT in the last 72 hours.   No results for input(s): NA, K, CL, CO2, GLUCOSE, BUN, CREATININE, CALCIUM in the last 72 hours.    Intake/Output Summary (Last 24 hours) at 03/12/2021 0941 Last data filed at 03/12/2021 0817 Gross per 24 hour  Intake 478 ml  Output --  Net 478 ml         Physical Exam: Vital Signs Blood pressure (!) 103/58, pulse 90, temperature 98.8 F (37.1 C), resp. rate 16, height 5' 4"  (1.626 m), weight 64.2 kg, last menstrual period 04/20/2005, SpO2 98 %. Constitutional: No distress . Vital signs reviewed. HENT: Normocephalic.  Atraumatic. Neck: Dressing CDI Eyes: EOMI. No discharge. Cardiovascular: No JVD.  RRR. Respiratory: Normal effort.  No stridor.  Bilateral clear to auscultation. GI: Non-distended.  BS +. Skin: Warm and dry.  Intact. Psych: Normal mood.  Normal behavior. Musc: No edema in extremities.  No tenderness in extremities. Neuro: Alert and oriented Motor: Bilateral upper extremities: 4+/5 proximal to distal Bilateral lower extremities: Hip flexion, knee extension 2-/5, ankle dorsiflexion 1+/5 Sensation diminished to light touch distal to knees as well as fingertips, stable   Assessment/Plan: 1. Functional deficits which require 3+ hours per day of interdisciplinary therapy in a comprehensive inpatient rehab setting. Physiatrist is providing close team supervision and 24 hour management of active medical problems listed below. Physiatrist and rehab team continue to assess barriers to discharge/monitor patient progress toward functional and medical goals  Care Tool:  Bathing    Body parts bathed by patient:  Right arm, Left arm, Chest, Abdomen, Front perineal area, Right upper leg, Left upper leg   Body parts bathed by helper: Buttocks, Right lower leg, Left lower leg, Face     Bathing assist Assist Level: Minimal Assistance - Patient > 75% (bed level)     Upper Body Dressing/Undressing Upper body dressing   What is the patient wearing?: Pull over shirt    Upper body assist Assist Level: Set up assist    Lower Body Dressing/Undressing Lower body dressing      What is the patient wearing?: Pants     Lower body assist Assist for lower body dressing: Total Assistance - Patient < 25%     Toileting Toileting    Toileting assist Assist for toileting: Dependent - Patient 0%     Transfers Chair/bed transfer  Transfers assist     Chair/bed transfer assist level: Moderate Assistance - Patient 50 - 74% Chair/bed transfer assistive device: Sliding board   Locomotion Ambulation   Ambulation assist   Ambulation activity did not occur: Safety/medical concerns          Walk 10 feet activity   Assist  Walk 10 feet activity did not occur: Safety/medical concerns        Walk 50 feet activity   Assist Walk 50 feet with 2 turns activity did  not occur: Safety/medical concerns         Walk 150 feet activity   Assist Walk 150 feet activity did not occur: Safety/medical concerns         Walk 10 feet on uneven surface  activity   Assist Walk 10 feet on uneven surfaces activity did not occur: Safety/medical concerns         Wheelchair     Assist Is the patient using a wheelchair?: Yes Type of Wheelchair: Manual Wheelchair activity did not occur: Safety/medical concerns         Wheelchair 50 feet with 2 turns activity    Assist    Wheelchair 50 feet with 2 turns activity did not occur: Safety/medical concerns       Wheelchair 150 feet activity     Assist  Wheelchair 150 feet activity did not occur: Safety/medical concerns          Medical Problem List and Plan: 1.  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   Continue  CIR 2.  Antithrombotics: -DVT/anticoagulation:  Pharmaceutical: Other (comment)--Eliquis bid Stopped SCDs since on Eliquis, and cannot wear with PRAFO's.              -antiplatelet therapy: -- N/A 3. Scoliosis/Pain Management: Lidocaine patches. Gabapentin 200 mg/HS, increased to 400 qhs on 9/15 Low dose oxycodone prn 4. Mood:  LCSW to follow for evaluation and support.              -antipsychotic agents: See #13 5. Neuropsych: This patient is capable of making decisions on her own behalf. 6. Skin/Wound Care: Routine pressure relief measures.  7. Fluids/Electrolytes/Nutrition: Monitor I/Os  9/11 appreciate RD input re: nutrition, food choices, etc, calorie ct Continue K dur for now.  -dced NGT 8. A fib: Monitor HR TID--amiodarone decreased to once daily on 09/06             --metoprolol d/ced due to hypotension             Monitor with increased activity 9. Urinary retention: Schedule toileting             --urecholine increased to 20 mg TID on 09/07-->monitor for orthostasis 9/8-  Flomax 0.4 mg QHS- not allergic to Sulfa, increased to 0.8 on 9/13 Urecholine 35m TID PVRs ordered 10. Dysphagia: on regular diet, gluten free   11. Hypoxic hypercarbic respiratory failure: Continue aspiration precautions.   9/8- trach out 2 days ago- trach site healing- con't to monitor  9/12- no air leak- off O2- doing well- con't to monitor 12. Hyponatremia: Na improving from nadir of 115             Sodium 131 on 9/12  Continue to monitor 13.  Sleep disturbance:  Seroquel DC'd on 9/14 due to morning lethargy Continue melatonin Need to cautious re: uptiration-->hypotension  Wean as tolerated Monitor with d/c in medication 14. Hyperglycemia:  Hgb a1C-5.0-->likely due to tube feeds and supplement between meals.             -will monitor CBG X 48  hours.    -cbg's normal---dc'ed 91/0  LOS: 8 days A FACE TO FACE EVALUATION WAS PERFORMED  Ariyon Gerstenberger ALorie Phenix9/15/2022, 9:41 AM

## 2021-03-12 NOTE — Progress Notes (Signed)
New orders received from Jane (PA). EkG ordered and done. Mews protocol initiated.

## 2021-03-12 NOTE — Progress Notes (Signed)
Occupational Therapy Session Note  Patient Details  Name: Angela Morales MRN: [id number] Date of Birth: 19-Jan-1946  Today's Date: 03/12/2021 OT Individual Time: 6270-3500 OT Individual Time Calculation (min): 52 min    Short Term Goals: Week 1:  OT Short Term Goal 1 (Week 1): Pt will dons shirt in unsupported sitting with no more than CGA for sitting balance OT Short Term Goal 1 - Progress (Week 1): Met OT Short Term Goal 2 (Week 1): Pt will transfer with LRAD and 1 caregiver to w/c in prep for [facility] transfers OT Short Term Goal 2 - Progress (Week 1): Met OT Short Term Goal 3 (Week 1): Pt will maintain dynamic sitting balnace for 10 min with no more than MIN A to demo improved endurance OT Short Term Goal 3 - Progress (Week 1): Met  Skilled Therapeutic Interventions/Progress Updates:    Pt in bed to start session resting with son coming in to visit as well.  She reports dizziness/ light headedness at times over the past year of short duration (less than 10 seconds) that usually occurs with supine to sit or at times when rolling in the bed.  While here on rehab she reports this with transition to sitting, but not as much with rolling.  BP was taken in supine at 111/67 and then in sitting at 127/74.  With initial transition from supine to sit she reported slight dizziness which went away in under 10 seconds.  No noted nystagmus was seen.  Mod assist was needed for transition to sitting and then to supine.  No dizziness reported with transition back to supine.  She rolled side to side as well without dizziness reported in either direction.  Completed testing of both the right and left inner ear with negative results for horizontal, anterior, or posterior canals on multiple attempts.  She was able to transition back to sitting again for further visual testing at mod assist level, but no report of dizziness.  Visual tracking was WFLs overall with AROM WFLS as well.  Convergence was impaired with right  eye not converging on multiple attempts, however pt reports no issues with diplopia or difficulty with reading tasks as long as she is wearing her glasses.  Glasses were not on during visual testing.  Saccadic movements were WFLs as well as VOR with head movements horizontally as well vertically.  Head thrust test was negative as well.  She did exhibit slight jerkiness with VOR cancellation test in both directions but was able to maintain eyes on target.  Slight increase in dizziness was noted briefly with this but resolved quickly.  Pt reports not dizziness with transitions in the hallway in her wheelchair or with transfers or quick head movements.  Positive VOR cancellation is usually indicative of cerebellar dysfunction however pt with no changes noted on recent MRI or CT scan.  Would continue to monitor dizziness occurrence with movement but at this time did not see any clear evidence of the cause of her intermittent dizziness and it was only reproducible with one transition from supine to sit.  Would recommend continued work visual convergence as this was a evident with testing.  Would also recommend continued exercises/activities incorporating head and eye movements to see if this reproduces any further symptoms.    Therapy Documentation Precautions:  Precautions Precautions: Fall, Other (comment) Precaution Comments: cortrak; bladder/bowel incontinence, log roll for back pain even PTA Required Braces or Orthoses: Other Brace Other Brace: bilateral ankle air splints Restrictions Weight Bearing  Restrictions: No  Pain: Pain Assessment Pain Scale: Faces Faces Pain Scale: Hurts little more Pain Type: Acute pain Pain Location: Back Pain Orientation: Lower Pain Descriptors / Indicators: Discomfort Pain Onset: With Activity Pain Intervention(s): Repositioned ADL: See Care Tool Section for some details of mobility and selfcare  Therapy/Group: Individual Therapy  Najib Colmenares  OTR/L 03/12/2021, 3:46 PM

## 2021-03-12 NOTE — Progress Notes (Signed)
Occupational Therapy Session Note  Patient Details  Name: Angela Morales MRN: [id number] Date of Birth: 09/10/1945  Today's Date: 03/12/2021 OT Individual Time: 0700-0810 OT Individual Time Calculation (min): 70 min    Short Term Goals: Week 1:  OT Short Term Goal 1 (Week 1): Pt will dons shirt in unsupported sitting with no more than CGA for sitting balance OT Short Term Goal 2 (Week 1): Pt will transfer with LRAD and 1 caregiver to w/c in prep for [facility] transfers OT Short Term Goal 3 (Week 1): Pt will maintain dynamic sitting balnace for 10 min with no more than MIN A to demo improved endurance  Skilled Therapeutic Interventions/Progress Updates:    Pt resting in bed upon arrival. Pt reports that she has been experiencing pain as noted below and not been able to rest since 0230. Pt agreeable with starting her day. Bathing/dressing at bed level this morning. BLE stretching when donning air splints and shoes provided some pain relief. Rolling R/L in bed using bed rails with min A. Pt required assistance moving BLE off EOB but was able to push up from sidelying with CGA. SB transfer to w/c with min A after positioning of SB. Pt able to repostition self in w/c (scooting back and over). Pt completed grooming tasks at sink. Pt remained in w/c with all needs within reach.   Therapy Documentation Precautions:  Precautions Precautions: Fall, Other (comment) Precaution Comments: cortrak; bladder/bowel incontinence, log roll for back pain even PTA Required Braces or Orthoses: Other Brace Other Brace: bilateral ankle air splints Restrictions Weight Bearing Restrictions: No Pain:  Pt c/o 9/10 BLE/foot "hot and burning" sensation started approx 0230; minimal relief with gentle stretching and repositioning Pt also commented that her hands were sore from yesterday; relief noted with increased activity   Therapy/Group: Individual Therapy  Leroy Libman 03/12/2021, 8:12 AM

## 2021-03-12 NOTE — Progress Notes (Signed)
Occupational Therapy Weekly Progress Note  Patient Details  Name: Angela Morales MRN: [id number] Date of Birth: 03/12/1946  Beginning of progress report period: March 05, 2021 End of progress report period: March 12, 2021  Patient has met 3 of  short term goals.  Pt is making steady progress with BADLs at bed level and/or w/c level and SB transfers. Pt continues to report increased back pain when sitting unsupported and with dynamic sitting tasks. Bed mobility with mod A. UB bahting/dressing w/c level or bed level with supervision. LB bathing with mod A. LB dressing with tot A at bed level. SB tranfsers with mod A. Sitting balance EOB with supervision and UE support. Pt has reported occasional dizziness with rolling in bed but has been inconsistent. Pt's husband has not been present for therapy sessions.   Patient continues to demonstrate the following deficits: muscle weakness, decreased cardiorespiratoy endurance, impaired timing and sequencing, unbalanced muscle activation, and decreased coordination, and decreased sitting balance, decreased standing balance, decreased postural control, and decreased balance strategies and therefore will continue to benefit from skilled OT intervention to enhance overall performance with BADL and Reduce care partner burden.  Patient progressing toward long term goals..  Continue plan of care.  OT Short Term Goals Week 1:  OT Short Term Goal 1 (Week 1): Pt will dons shirt in unsupported sitting with no more than CGA for sitting balance OT Short Term Goal 1 - Progress (Week 1): Met OT Short Term Goal 2 (Week 1): Pt will transfer with LRAD and 1 caregiver to w/c in prep for BSC transfers OT Short Term Goal 2 - Progress (Week 1): Met OT Short Term Goal 3 (Week 1): Pt will maintain dynamic sitting balnace for 10 min with no more than MIN A to demo improved endurance OT Short Term Goal 3 - Progress (Week 1): Met Week 2:  OT Short Term Goal 1 (Week 2): Pt  will thread BLE into pants while seated EOB/w/c with mod A using AE PRN OT Short Term Goal 2 (Week 2): Pt will perform SB transfers with min A OT Short Term Goal 3 (Week 2): Pt will perform toileting tasks seated on BSC with max A   Lanier, Thomas Chappell 03/12/2021, 2:49 PM  

## 2021-03-13 ENCOUNTER — Encounter (HOSPITAL_COMMUNITY): Payer: Self-pay | Admitting: Internal Medicine

## 2021-03-13 ENCOUNTER — Inpatient Hospital Stay (HOSPITAL_COMMUNITY)
Admission: AD | Admit: 2021-03-13 | Discharge: 2021-03-20 | DRG: 309 | Disposition: A | Discharge status: Rehabilitation Facility | Payer: Medicare Other | Source: Other Acute Inpatient Hospital | Attending: Internal Medicine | Admitting: Internal Medicine

## 2021-03-13 ENCOUNTER — Other Ambulatory Visit: Payer: Self-pay

## 2021-03-13 DIAGNOSIS — I4891 Unspecified atrial fibrillation: Secondary | ICD-10-CM | POA: Diagnosis not present

## 2021-03-13 DIAGNOSIS — M25562 Pain in left knee: Secondary | ICD-10-CM | POA: Diagnosis not present

## 2021-03-13 DIAGNOSIS — Z91048 Other nonmedicinal substance allergy status: Secondary | ICD-10-CM

## 2021-03-13 DIAGNOSIS — G4700 Insomnia, unspecified: Secondary | ICD-10-CM | POA: Diagnosis not present

## 2021-03-13 DIAGNOSIS — H409 Unspecified glaucoma: Secondary | ICD-10-CM | POA: Diagnosis not present

## 2021-03-13 DIAGNOSIS — Z9049 Acquired absence of other specified parts of digestive tract: Secondary | ICD-10-CM

## 2021-03-13 DIAGNOSIS — J302 Other seasonal allergic rhinitis: Secondary | ICD-10-CM | POA: Diagnosis not present

## 2021-03-13 DIAGNOSIS — G610 Guillain-Barre syndrome: Secondary | ICD-10-CM | POA: Diagnosis not present

## 2021-03-13 DIAGNOSIS — Z79899 Other long term (current) drug therapy: Secondary | ICD-10-CM

## 2021-03-13 DIAGNOSIS — Z20822 Contact with and (suspected) exposure to covid-19: Secondary | ICD-10-CM | POA: Diagnosis present

## 2021-03-13 DIAGNOSIS — Z9851 Tubal ligation status: Secondary | ICD-10-CM | POA: Diagnosis not present

## 2021-03-13 DIAGNOSIS — K8681 Exocrine pancreatic insufficiency: Secondary | ICD-10-CM | POA: Diagnosis present

## 2021-03-13 DIAGNOSIS — M419 Scoliosis, unspecified: Secondary | ICD-10-CM | POA: Diagnosis not present

## 2021-03-13 DIAGNOSIS — I4892 Unspecified atrial flutter: Secondary | ICD-10-CM | POA: Diagnosis not present

## 2021-03-13 DIAGNOSIS — I4819 Other persistent atrial fibrillation: Secondary | ICD-10-CM | POA: Diagnosis not present

## 2021-03-13 DIAGNOSIS — G894 Chronic pain syndrome: Secondary | ICD-10-CM | POA: Diagnosis not present

## 2021-03-13 DIAGNOSIS — E871 Hypo-osmolality and hyponatremia: Secondary | ICD-10-CM | POA: Diagnosis present

## 2021-03-13 DIAGNOSIS — Z90710 Acquired absence of both cervix and uterus: Secondary | ICD-10-CM

## 2021-03-13 DIAGNOSIS — Z881 Allergy status to other antibiotic agents status: Secondary | ICD-10-CM | POA: Diagnosis not present

## 2021-03-13 DIAGNOSIS — K900 Celiac disease: Secondary | ICD-10-CM | POA: Diagnosis not present

## 2021-03-13 DIAGNOSIS — Z96652 Presence of left artificial knee joint: Secondary | ICD-10-CM | POA: Diagnosis present

## 2021-03-13 DIAGNOSIS — I959 Hypotension, unspecified: Secondary | ICD-10-CM | POA: Diagnosis not present

## 2021-03-13 DIAGNOSIS — R339 Retention of urine, unspecified: Secondary | ICD-10-CM | POA: Diagnosis present

## 2021-03-13 DIAGNOSIS — N319 Neuromuscular dysfunction of bladder, unspecified: Secondary | ICD-10-CM | POA: Diagnosis present

## 2021-03-13 DIAGNOSIS — E876 Hypokalemia: Secondary | ICD-10-CM | POA: Diagnosis present

## 2021-03-13 DIAGNOSIS — E222 Syndrome of inappropriate secretion of antidiuretic hormone: Secondary | ICD-10-CM | POA: Diagnosis present

## 2021-03-13 DIAGNOSIS — Z888 Allergy status to other drugs, medicaments and biological substances status: Secondary | ICD-10-CM

## 2021-03-13 DIAGNOSIS — Z91018 Allergy to other foods: Secondary | ICD-10-CM

## 2021-03-13 LAB — BASIC METABOLIC PANEL
Anion gap: 7 (ref 5–15)
BUN: 5 mg/dL — ABNORMAL LOW (ref 8–23)
CO2: 26 mmol/L (ref 22–32)
Calcium: 8.3 mg/dL — ABNORMAL LOW (ref 8.9–10.3)
Chloride: 101 mmol/L (ref 98–111)
Creatinine, Ser: 0.45 mg/dL (ref 0.44–1.00)
GFR, Estimated: >60 mL/min (ref >60)
Glucose, Bld: 99 mg/dL (ref 70–99)
Potassium: 4.3 mmol/L (ref 3.5–5.1)
Sodium: 134 mmol/L — ABNORMAL LOW (ref 135–145)

## 2021-03-13 LAB — CBC
HCT: 36.1 % (ref 36.0–46.0)
Hemoglobin: 12 g/dL (ref 12.0–15.0)
MCH: 32.6 pg (ref 26.0–34.0)
MCHC: 33.2 g/dL (ref 30.0–36.0)
MCV: 98.1 fL (ref 80.0–100.0)
Platelets: 201 10*3/uL (ref 150–400)
RBC: 3.68 MIL/uL — ABNORMAL LOW (ref 3.87–5.11)
RDW: 14.7 % (ref 11.5–15.5)
WBC: 5.7 10*3/uL (ref 4.0–10.5)
nRBC: 0 % (ref 0.0–0.2)

## 2021-03-13 LAB — MAGNESIUM: Magnesium: 2 mg/dL (ref 1.7–2.4)

## 2021-03-13 MED ORDER — TRAZODONE HCL 50 MG PO TABS: 50.0000 mg | ORAL_TABLET | Freq: Every evening | ORAL | Status: DC | PRN

## 2021-03-13 MED ORDER — AMIODARONE HCL IN DEXTROSE 360-4.14 MG/200ML-% IV SOLN
30.0000 mg/h | INTRAVENOUS | Status: DC
  Administered 2021-03-13 – 2021-03-16 (×7): 30 mg/h via INTRAVENOUS
  Filled 2021-03-13 (×7): qty 200

## 2021-03-13 MED ORDER — AMIODARONE HCL IN DEXTROSE 360-4.14 MG/200ML-% IV SOLN
60.0000 mg/h | INTRAVENOUS | Status: DC
  Administered 2021-03-13: 60 mg/h via INTRAVENOUS
  Filled 2021-03-13: qty 200

## 2021-03-13 MED ORDER — PANTOPRAZOLE SODIUM 40 MG PO TBEC
40.0000 mg | DELAYED_RELEASE_TABLET | Freq: Every day | ORAL | Status: DC
  Administered 2021-03-13 – 2021-03-20 (×8): 40 mg via ORAL
  Filled 2021-03-13 (×8): qty 1

## 2021-03-13 MED ORDER — OXYCODONE HCL 5 MG PO TABS
2.5000 mg | ORAL_TABLET | ORAL | Status: DC | PRN
  Administered 2021-03-13 – 2021-03-19 (×8): 5 mg via ORAL
  Filled 2021-03-13 (×9): qty 1

## 2021-03-13 MED ORDER — BOOST / RESOURCE BREEZE PO LIQD CUSTOM
1.0000 | Freq: Two times a day (BID) | ORAL | Status: DC
  Administered 2021-03-13: 1 via ORAL

## 2021-03-13 MED ORDER — SODIUM CHLORIDE 0.9 % IV BOLUS
500.0000 mL | Freq: Once | INTRAVENOUS | Status: AC
  Administered 2021-03-13: 500 mL via INTRAVENOUS

## 2021-03-13 MED ORDER — QUETIAPINE FUMARATE 25 MG PO TABS: 25.0000 mg | ORAL_TABLET | Freq: Every day | ORAL | Status: DC

## 2021-03-13 MED ORDER — GABAPENTIN 250 MG/5ML PO SOLN: 200.0000 mg | Freq: Every day | ORAL | Status: DC

## 2021-03-13 MED ORDER — TIMOLOL MALEATE 0.5 % OP SOLN
1.0000 [drp] | Freq: Two times a day (BID) | OPHTHALMIC | Status: DC
  Filled 2021-03-13: qty 5

## 2021-03-13 MED ORDER — CYCLOBENZAPRINE HCL 10 MG PO TABS
5.0000 mg | ORAL_TABLET | Freq: Two times a day (BID) | ORAL | Status: DC
  Administered 2021-03-13 – 2021-03-20 (×15): 5 mg via ORAL
  Filled 2021-03-13 (×15): qty 1

## 2021-03-13 MED ORDER — SODIUM CHLORIDE 0.9 % IV SOLN: INTRAVENOUS | Status: DC

## 2021-03-13 MED ORDER — AMIODARONE IV BOLUS ONLY 150 MG/100ML
150.0000 mg | Freq: Once | INTRAVENOUS | Status: DC
  Filled 2021-03-13: qty 100

## 2021-03-13 MED ORDER — TIMOLOL MALEATE 0.5 % OP SOLN
1.0000 [drp] | Freq: Two times a day (BID) | OPHTHALMIC | Status: DC
  Administered 2021-03-13 – 2021-03-20 (×15): 1 [drp] via OPHTHALMIC
  Filled 2021-03-13: qty 5

## 2021-03-13 MED ORDER — CYCLOBENZAPRINE HCL 10 MG PO TABS: 5.0000 mg | ORAL_TABLET | Freq: Two times a day (BID) | ORAL | Status: DC | PRN

## 2021-03-13 MED ORDER — POLYVINYL ALCOHOL 1.4 % OP SOLN
1.0000 [drp] | OPHTHALMIC | Status: DC | PRN
  Filled 2021-03-13: qty 15

## 2021-03-13 MED ORDER — ACETAMINOPHEN 325 MG PO TABS
650.0000 mg | ORAL_TABLET | Freq: Four times a day (QID) | ORAL | Status: DC | PRN
  Administered 2021-03-13: 650 mg via ORAL
  Filled 2021-03-13: qty 2

## 2021-03-13 MED ORDER — POLYETHYLENE GLYCOL 3350 17 G PO PACK
17.0000 g | PACK | Freq: Every day | ORAL | Status: DC
  Administered 2021-03-14: 17 g via ORAL
  Filled 2021-03-13 (×5): qty 1

## 2021-03-13 MED ORDER — GABAPENTIN 400 MG PO CAPS
400.0000 mg | ORAL_CAPSULE | Freq: Every day | ORAL | Status: DC
  Administered 2021-03-13 – 2021-03-19 (×7): 400 mg via ORAL
  Filled 2021-03-13 (×7): qty 1

## 2021-03-13 MED ORDER — APIXABAN 5 MG PO TABS
5.0000 mg | ORAL_TABLET | Freq: Two times a day (BID) | ORAL | Status: DC
  Administered 2021-03-13 – 2021-03-20 (×15): 5 mg via ORAL
  Filled 2021-03-13 (×15): qty 1

## 2021-03-13 MED ORDER — LATANOPROST 0.005 % OP SOLN
1.0000 [drp] | Freq: Every day | OPHTHALMIC | Status: DC
  Administered 2021-03-13 – 2021-03-19 (×7): 1 [drp] via OPHTHALMIC
  Filled 2021-03-13: qty 2.5

## 2021-03-13 MED ORDER — OSMOLITE 1.5 CAL PO LIQD: 720.0000 mL | ORAL | Status: DC

## 2021-03-13 MED ORDER — POTASSIUM CHLORIDE 20 MEQ PO PACK
60.0000 meq | PACK | Freq: Every day | ORAL | Status: DC
  Administered 2021-03-14 – 2021-03-20 (×7): 60 meq via ORAL
  Filled 2021-03-13 (×8): qty 3

## 2021-03-13 MED ORDER — SENNOSIDES-DOCUSATE SODIUM 8.6-50 MG PO TABS: 1.0000 | ORAL_TABLET | Freq: Every evening | ORAL | Status: DC | PRN

## 2021-03-13 MED ORDER — BETHANECHOL CHLORIDE 10 MG PO TABS
20.0000 mg | ORAL_TABLET | Freq: Three times a day (TID) | ORAL | Status: DC
  Administered 2021-03-13 – 2021-03-20 (×22): 20 mg via ORAL
  Filled 2021-03-13 (×22): qty 2

## 2021-03-13 MED ORDER — OXYCODONE HCL 5 MG PO TABS
2.5000 mg | ORAL_TABLET | ORAL | Status: DC | PRN
  Administered 2021-03-13: 2.5 mg via ORAL
  Filled 2021-03-13: qty 1

## 2021-03-13 MED ORDER — AMIODARONE LOAD VIA INFUSION
150.0000 mg | Freq: Once | INTRAVENOUS | Status: AC
  Administered 2021-03-13: 150 mg via INTRAVENOUS
  Filled 2021-03-13: qty 83.34

## 2021-03-13 MED ORDER — AMIODARONE HCL 200 MG PO TABS
200.0000 mg | ORAL_TABLET | Freq: Every day | ORAL | Status: DC
  Administered 2021-03-13: 200 mg via ORAL
  Filled 2021-03-13: qty 1

## 2021-03-13 NOTE — Progress Notes (Signed)
Inpatient Rehab Admissions Coordinator:   Note pt discharged to acute services on 9/16.  Therapy evaluations pending.  If pt wishes to return to CIR we will need to pursue authorization with [facility] Medicare again. Awaiting therapy recommendations.   Shann Medal, PT, DPT Admissions Coordinator [phone] 03/13/21  1:46 PM

## 2021-03-13 NOTE — Significant Event (Signed)
Rapid Response Event Note   Reason for Call :  Aflutter w/ RVR, pt needs to be transferred  Initial Focused Assessment:  Pt laying in bed with eyes closed, but easily awakens to voice. In no acute distress. Pt reports no pain.  VS: BP 93/60, HR 110s, RR 18, O2 97 on RA.   Interventions:  Dr Marlowe Sax already at bedside when RRT arrived. Dr Marlowe Sax spoke to on call cardiologist who suggested admission and amiodarone. Md ordered 500cc bolus and already infusing.  VS: BP 96/65, HR 113, O2 98% on RA  Plan of Care:  Transfer to 4E07 when available. Await TRH MD orders upon admission to 4E.   Event Summary:   MD Notified: Dr Marlowe Sax prior to arrival Call Time: [place] Time: 0114 End Time: 0200  Mervyn Gay, RN

## 2021-03-13 NOTE — Progress Notes (Signed)
New order received from DR. Rathore to give another 500cc 0.9% Nacl in 78mns.

## 2021-03-13 NOTE — Progress Notes (Signed)
Inpatient Rehabilitation Care Coordinator Discharge Note   Patient Details  Name: Angela Morales MRN: [id number] Date of Birth: Apr 22, 1946   Discharge location: D/c to acute hospital due to medical reasons/   Length of Stay: 8 days  Discharge activity level: Min A to  Max A  Home/community participation:    Patient response LF:YBOFBP Literacy - How often do you need to have someone help you when you read instructions, pamphlets, or other written material from your doctor or pharmacy?: Never  Patient response ZW:CHENID Isolation - How often do you feel lonely or isolated from those around you?: Never  Services provided included:  N/A  Financial Services: [facility]      Choices offered to/list presented to:   Follow-up services arranged:      Patient response to transportation need: Is the patient able to respond to transportation needs?: Yes In the past 12 months, has lack of transportation kept you from medical appointments or from getting medications?: No In the past 12 months, has lack of transportation kept you from meetings, work, or from getting things needed for daily living?: No   Comments (or additional information):  Patient/Family verbalized understanding of follow-up arrangements:     Individual responsible for coordination of the follow-up plan:    Confirmed correct DME delivered: Rana Snare 03/13/2021    Rana Snare

## 2021-03-13 NOTE — Progress Notes (Signed)
Contacted by RN around 10 pm regarding patient's persistent tachycardia with HR in 110s. EKG showed A flutter with controlled HR and patient asymptomatic --discussed with cards fellow who recommended monitoring, diltiazem or trying amiodarone bolus if BP a concern and she will have EP follow up on patient in am. She recommended  having hospitalist transfer patient to regular floor if needed. Patient's BP did drop to 90's--contacted Dr.Rathore who recommended  fluid bolus to stabilize patient and transfer to tele for management.  Discussed with nurse and orders given for 500 cc bolus/30 minutes with repeat BP afterwards.

## 2021-03-13 NOTE — Progress Notes (Signed)
Late entry Patient reassessed at 15:30 HRs 90's-110s, AFlutter with variable conduction BP 90's-100's Patient felt well without complaints Discussed with RN  DCCV Monday, pt is aware  Tommye Standard, PA-C

## 2021-03-13 NOTE — Progress Notes (Signed)
Occupational Therapy Discharge Note  This patient was unable to complete the inpatient rehab program due to medical status decline; therefore did not meet their long term goals. Pt left the program requiring supervision for UB bathing and dressing and max A for LB bathing and dressing and Slide board transfers require with mod A. This patient is being discharged from OT services at this time.  BIMS at time of d/c  Pt unable to complete due to medical status  See CareTool for functional status details.  If the patient is able to return to inpatient rehabilitation within 3 midnights, this may be considered an interrupted stay and therapy services will resume as ordered. Modification and reinstatement of their goals will be made upon completion of therapy service reevaluations.

## 2021-03-13 NOTE — Consult Note (Addendum)
Cardiology Consultation:   Patient ID: Angela Morales MRN: [id number]; DOB: [birth year]  Admit date: 03/13/2021 Date of Consult: 03/13/2021  PCP:  Deland Pretty, MD   [facility] Providers Cardiologist:  Sanda Klein, MD EP: DR. Curt Bears   Patient Profile:   Angela Morales is a 75 y.o. female with a hx of AFib, AFlutter, celiac disease, DDD, who is being seen 03/13/2021 for the evaluation of recurrent Aflutter at the request of Dr. Wynelle Cleveland.  History of Present Illness:   Angela Morales was recently admitted to [facility] 02/05/21 with rapidly progressive weakness with eventually inability to ambulate, developed respiratory failure she as diagnosed with GBS and managed with CCM.She completed IVIG, treated for pneumonia during her stay, had PAFib/flutter, SIADH, sepsis, eventually weaned off trach. She did have amiodarone and a DCCV during her stay. Discharge to CIR 03/04/21  No AAD, on her eliquis, amiodarone 276m daily, and toprol 247mdaily seems to have been  making slow steady progress though with ongoing deficits, intake improving   Last night vitals noted HR 110's and ultimately with some BPs into the 90's discharged to readmit to in patient active hospitalization.  Seems there was discussion with overnight cardiology who recommended an amiodarone bolus and EP consult in the AM She was given IVF   I don't see that she go any IV amiodarone, but was continued on 20019mO  LABS Mag 2.0  03/09/21 K+ 3.8 BUN/Creat 10/0.41WBC 5.6 H/H 12/36 Plts 281  Pt denies any overt cardiac awareness, currently is unaware of her RVR, says only when it goes "really fast" can she tel. She denies any CP or SOB.  Says her HRs have been perfect until yesterday. Feels like she has made pretty good progress UE strength, LE are slower to improve, but are, he remains with neuropathy    Past Medical History:  Diagnosis Date   Abnormal uterine bleeding    on HRT   Allergy    seasonal   Arthritis     Atrial fibrillation (HCC)    Celiac disease    DDD (degenerative disc disease)    Dysrhythmia    Glaucoma    Microscopic colitis    Osteoarthritis of both knees    PONV (postoperative nausea and vomiting)    [place]) [place]   Past Surgical History:  Procedure Laterality Date   APPENDECTOMY     ATRIAL FIBRILLATION ABLATION N/A 01/28/2021   Procedure: ATRIAL FIBRILLATION ABLATION;  Surgeon: CamConstance HawD;  Location: [facility] [place] [facility];  Service: Cardiovascular;  Laterality: N/A;   CARDIOVERSION N/A 10/30/2020   Procedure: CARDIOVERSION;  Surgeon: SkaJerline PainD;  [facility];  Service: Cardiovascular;  Laterality: N/A;   COLONOSCOPY     FOOT FRACTURE SURGERY  10/2013   TONSILLECTOMY AND ADENOIDECTOMY     TOTAL KNEE ARTHROPLASTY Left 03/22/2016   Procedure: TOTAL KNEE ARTHROPLASTY;  Surgeon: JohDorna LeitzD;  [facility] [place] [place];  Laterality: Left;   TUBAL LIGATION  1981   VAGINAL HYSTERECTOMY  04/20/05   prolapse, adenomyosis     Home Medications:  Prior to Admission medications   Medication Sig Start Date End Date Taking? Authorizing Provider  acetaminophen (TYLENOL) 160 MG/5ML solution Take 20.3 mLs (650 mg total) by mouth every 6 (six) hours as needed for mild pain, moderate pain or fever. 03/04/21   SmiCandee FurbishD  albuterol (PROVENTIL) (2.5 MG/3ML) 0.083% nebulizer solution Take 3 mLs (2.5 mg total)  by nebulization every 4 (four) hours as needed for wheezing or shortness of breath. 03/04/21   [name], MD  amiodarone (PACERONE) 200 MG tablet Take 1 tablet (200 mg total) by mouth daily. 03/04/21   [name], MD  bethanechol (URECHOLINE) 10 MG tablet Take 2 tablets (20 mg total) by mouth 3 (three) times daily. 03/04/21   [name], MD  chlorhexidine (PERIDEX) 0.12 % solution 15 mLs by Mouth Rinse route 2 (two) times daily. 03/04/21   [name], MD  Chlorhexidine Gluconate Cloth 2 % PADS Apply 6  each topically daily. 03/04/21   [name], MD  cyclobenzaprine (FLEXERIL) 5 MG tablet Take 1 tablet (5 mg total) by mouth 2 (two) times daily as needed for muscle spasms. 03/04/21   [name], MD  docusate (COLACE) 50 MG/5ML liquid Take 10 mLs (100 mg total) by mouth 2 (two) times daily. 03/04/21   [name], MD  gabapentin (NEURONTIN) 250 MG/5ML solution Take 4 mLs (200 mg total) by mouth at bedtime. 03/04/21   [name], MD  guaiFENesin (ROBITUSSIN) 100 MG/5ML SOLN Take 15 mLs (300 mg total) by mouth every 6 (six) hours as needed for cough or to loosen phlegm. 03/04/21   [name], MD  latanoprost (XALATAN) 0.005 % ophthalmic solution Place 1 drop into the left eye at bedtime. 03/04/21   [name], MD  lip balm Valeria Batman) OINT Apply 1 application topically as needed for lip care. 03/04/21   [name], MD  melatonin 3 MG TABS tablet Take 1 tablet (3 mg total) by mouth at bedtime. 03/04/21   [name], MD  Nutritional Supplements (FEEDING SUPPLEMENT, OSMOLITE 1.5 CAL,) LIQD Place 720 mLs into feeding tube daily. 03/04/21   [name], MD  Nutritional Supplements (FEEDING SUPPLEMENT, PROSOURCE TF,) liquid Place 45 mLs into feeding tube 2 (two) times daily. 03/04/21   [name], MD  Nystatin (GERHARDT'S BUTT CREAM) CREA Apply 1 application topically as needed for irritation. 03/04/21   [name], MD  oxyCODONE (OXY IR/ROXICODONE) 5 MG immediate release tablet Take 0.5 tablets (2.5 mg total) by mouth every 3 (three) hours as needed for moderate pain. 03/04/21   [name], MD  pantoprazole (PROTONIX) 40 MG tablet Take 1 tablet (40 mg total) by mouth daily. 03/04/21   [name], MD  polyethylene glycol (MIRALAX / GLYCOLAX) 17 g packet Take 17 g by mouth daily. 03/04/21   [name], MD  polyvinyl alcohol (LIQUIFILM TEARS) 1.4 % ophthalmic solution Place 1 drop into both eyes as needed for dry eyes. 03/04/21   [name], MD  potassium chloride  (KLOR-CON) 20 MEQ packet Take 60 mEq by mouth daily. 03/04/21   [name], MD  QUEtiapine (SEROQUEL) 25 MG tablet Take 1 tablet (25 mg total) by mouth at bedtime. 03/04/21   [name], MD  senna-docusate (SENOKOT-S) 8.6-50 MG tablet Take 1 tablet by mouth at bedtime as needed for mild constipation. 03/04/21   [name], MD    Inpatient Medications: Scheduled Meds:  amiodarone  200 mg Oral Daily   bethanechol  20 mg Oral TID   cyclobenzaprine  5 mg Oral BID   feeding supplement  1 Container Oral BID BM   gabapentin  400 mg Oral QHS   latanoprost  1 drop Left Eye QHS   pantoprazole  40 mg Oral Daily   polyethylene glycol  17 g Oral Daily   potassium chloride  60 mEq Oral Daily   timolol  1 drop Left Eye BID   Continuous Infusions:  sodium chloride 75 mL/hr at 03/13/21 0917   PRN Meds: acetaminophen, oxyCODONE, polyvinyl alcohol, senna-docusate, traZODone  Allergies:    Allergies  Allergen Reactions   Gluten Meal Other (See Comments)    celiac disease   Cefdinir Rash    Social History:   Social History   Socioeconomic History   Marital status: Married    Spouse name: Not on file   Number of children: 3   Years of education: Not on file   Highest education level: Not on file  Occupational History   Occupation: retired  Tobacco Use   Smoking status: Never   Smokeless tobacco: Never  Substance and Sexual Activity   Alcohol use: Yes    Alcohol/week: 4.0 - 6.0 Morales drinks    Types: 4 - 6 Glasses of wine per week    Comment: occasional   Drug use: No   Sexual activity: Yes    Partners: Male    Birth control/protection: Surgical    Comment: TVH  Other Topics Concern   Not on file  Social History Narrative   Not on file   Social Determinants of Health   Financial Resource Strain: Not on file  Food Insecurity: Not on file  Transportation Needs: Not on file  Physical Activity: Not on file  Stress: Not on file  Social Connections: Not on file   Intimate Partner Violence: Not on file    Family History:   Family History  Problem Relation Age of Onset   Osteoporosis Mother    Stroke Mother        mini   Prostate cancer Father    Heart disease Brother 50       shunt put in heart   Osteoarthritis Maternal Grandmother    Bipolar disorder Son    Liver disease Son    Breast cancer Cousin        1st cousin   Colon cancer Neg Hx      ROS:  Please see the history of present illness.  All other ROS reviewed and negative.     Physical Exam/Data:   Vitals:   03/13/21 0804  BP: (!) 82/59  Pulse: (!) 118  Resp: 14  Temp: 97.6 F (36.4 C)  TempSrc: Oral  SpO2: 96%   No intake or output data in the 24 hours ending 03/13/21 1009 Last 3 Weights 03/11/2021 03/11/2021 03/10/2021  Weight (lbs) 141 lb 8.6 oz 139 lb 5.3 oz 139 lb 12.4 oz  Weight (kg) 64.2 kg 63.2 kg 63.4 kg     There is no height or weight on file to calculate BMI.  General:  Well nourished, well developed, in no acute distress HEENT: normal Neck: no JVD Vascular: No carotid bruits Cardiac:  regular and tachycardic; no murmurs, gallops or rubs Lungs:  CTA b/l, no wheezing, rhonchi or rales  Abd: soft, nontender  Ext: no edema Musculoskeletal:  No deformities Skin: warm and dry  Neuro:  b/l UE/LE weakness (known) Psych:  Normal affect   EKG:  The EKG was personally reviewed and demonstrates:   AFlutter 116 bpm Aflutter 155bpm  OLD 02/25/21 SR 76bpm  Telemetry:  Telemetry was personally reviewed and demonstrates:   AFlutter 110s'-120's, some faster  Relevant CV Studies:  12/04/20: TTE IMPRESSIONS   1. Left ventricular ejection fraction, by estimation, is 55%. The  left  ventricle has normal function. The left ventricle has no regional wall  motion abnormalities. Left ventricular diastolic parameters were grossly  normal.   2. Right ventricular systolic function is normal. The right ventricular  size is mildly enlarged. There is normal pulmonary  artery systolic  pressure. The estimated right ventricular systolic pressure is 11.9 mmHg.   3. Left atrial size was moderately dilated.   4. Right atrial size was mild to moderately dilated.   5. The mitral valve is normal in structure. Mild mitral valve  regurgitation. No evidence of mitral stenosis.   6. Tricuspid valve regurgitation is mild to moderate.   7. The aortic valve is grossly normal. There is mild calcification of the  aortic valve. Aortic valve regurgitation is not visualized. No aortic  stenosis is present.   8. The inferior vena cava is normal in size with greater than 50%  respiratory variability, suggesting right atrial pressure of 3 mmHg.   01/28/21: EPS/ablation CONCLUSIONS: 1. Atrial fibrillation upon presentation.   2. Successful electrical isolation and anatomical encircling of all four pulmonary veins with radiofrequency current.  A WACA approach was used 3. Additional left atrial ablation was performed with a Morales box lesion created along the posterior wall of the left atrium 4. Atrial fibrillation successfully cardioverted to sinus rhythm. 5. No early apparent complications.  Laboratory Data:  High Sensitivity Troponin:  No results for input(s): TROPONINIHS in the last 720 hours.   Chemistry Recent Labs  Lab 03/09/21 0543 03/13/21 0907  NA 131*  --   K 3.8  --   CL 97*  --   CO2 26  --   GLUCOSE 90  --   BUN 10  --   CREATININE 0.41*  --   CALCIUM 8.6*  --   MG  --  2.0  GFRNONAA >60  --   ANIONGAP 8  --     No results for input(s): PROT, ALBUMIN, AST, ALT, ALKPHOS, BILITOT in the last 168 hours. Lipids No results for input(s): CHOL, TRIG, HDL, LABVLDL, LDLCALC, CHOLHDL in the last 168 hours.  Hematology Recent Labs  Lab 03/09/21 0543  WBC 5.6  RBC 3.71*  HGB 12.0  HCT 36.3  MCV 97.8  MCH 32.3  MCHC 33.1  RDW 14.9  PLT 281   Thyroid No results for input(s): TSH, FREET4 in the last 168 hours.  BNPNo results for input(s): BNP,  PROBNP in the last 168 hours.  DDimer No results for input(s): DDIMER in the last 168 hours.   Radiology/Studies:  No results found.   Assessment and Plan:   Persistenat AFib S/p ablation 01/28/21 New AFlutter, typical CHA2DS2Vasc is 3, on eliquis Reordered for here She has not missed any a/c doses since her DCCV 02/25/21, Angela Morales get her caught up for today, d/w RN to give now  Unable to get DCCV slot today, scheduled for tomorrow Angela Morales start amio gtt   BPs 90's-100's   Risk Assessment/Risk Scores:    For questions or updates, please contact [place] Please consult [email or web address] for contact info under    Signed, Angela Jamaica, PA-C  03/13/2021 10:09 AM  I have seen and examined this patient with Angela Morales.  Agree with above, note added to reflect my findings.  On exam, tachycardic, regular, no murmurs.  Patient was found to be tachycardic at rehab yesterday.  She has a history of persistent atrial fibrillation status post ablation 01/28/2021.  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  She has been recovering in [facility] inpatient rehab.  She represented to the hospital because she was found to be tachycardic.  She is in atrial flutter.  She does have a history of atrial flutter and had previously been on amiodarone.  We Angela Morales switch her to IV amiodarone.  We Angela Morales plan for cardioversion on Monday.  Angela Morales M. Rune Mendez MD 03/13/2021 4:19 PM

## 2021-03-13 NOTE — Progress Notes (Signed)
I&O cath due to inability to void, bladder scan showing >62m.  8031mof cloudy, yellow, pungent urine drained.  White flakes of sediment noted.

## 2021-03-13 NOTE — Progress Notes (Signed)
Physical Therapy Note  Patient Details  Name: Angela Morales MRN: [id number] Date of Birth: 03/08/1946 Today's Date: 03/13/2021    Physical Therapy Discharge Note  This patient was unable to complete the inpatient rehab program due to change in medical status; therefore did not meet their long term goals. Pt left the program at a MaxA assist level for their functional mobility/ transfers. This patient is being discharged from PT services at this time.  Pt's perception of pain in the last five days was unable to answer at this time.    See CareTool for functional status details  If the patient is able to return to inpatient rehabilitation within 3 midnights, this may be considered an interrupted stay and therapy services will resume as ordered. Modification and reinstatement of their goals will be made upon completion of therapy service reevaluations.     Debbora Dus 03/13/2021, 12:31 PM

## 2021-03-13 NOTE — Progress Notes (Deleted)
Physical Therapy Discharge Note  This patient was unable to complete the inpatient rehab program due to medical status decline; therefore did not meet their long term goals. Pt left the program at a Max assist level for their functional mobility/ transfers. This patient is being discharged from PT services at this time.  Pt's perception of pain in the last five days was unable to answer at this time.    See CareTool for functional status details  If the patient is able to return to inpatient rehabilitation within 3 midnights, this may be considered an interrupted stay and therapy services will resume as ordered. Modification and reinstatement of their goals will be made upon completion of therapy service reevaluations.

## 2021-03-13 NOTE — H&P (Signed)
History and Physical    Angela Morales XHB:[id number] DOB: [birth year] DOA: (Not on file)  PCP: Deland Pretty, MD Patient coming from: Inpatient rehab  Chief Complaint: Atrial flutter  HPI: Angela Morales is a 75 y.o. female with medical history significant of A. fib status post recent ablation on 8/3, SVT, severe lumbar scoliosis (not ambulatory surgery), celiac disease.  She was recently admitted for GBS and hospital course complicated by respiratory failure secondary to aspiration event which required intubation and tracheostomy placement.  Hospital course also complicated by severe hyponatremia, A. fib with RVR.  She was discharged to inpatient rehab on 9/7.  Tonight she developed persistent tachycardia with heart rate in the 110s but remained asymptomatic.  EKG showed atrial flutter.  Blood pressure low with systolic in the 81O. PM&R 78 assistant discussed the case with on-call cardiologist who recommended trying amiodarone bolus and she will have EP follow-up on the patient in the morning. [facility] contacted for transfer to the hospital floor for telemetry monitoring and administration of amiodarone bolus.  Patient was given a total of 1 L IV fluid bolus for hypotension.  Patient is resting comfortably.  She has no complaints.  Denies lightheadedness/dizziness, chest pain, or shortness of breath.  Reports history of recent ablation for A. fib last month.  Denies fevers, cough, nausea, vomiting, abdominal pain, or diarrhea.  No other complaints.  Review of Systems:  All systems reviewed and apart from history of presenting illness, are negative.  Past Medical History:  Diagnosis Date   Abnormal uterine bleeding    on HRT   Allergy    seasonal   Arthritis    Atrial fibrillation (HCC)    Celiac disease    DDD (degenerative disc disease)    Dysrhythmia    Glaucoma    Microscopic colitis    Osteoarthritis of both knees    PONV (postoperative nausea and vomiting)    SVT  (supraventricular tachycardia) ([place])     Past Surgical History:  Procedure Laterality Date   APPENDECTOMY     ATRIAL FIBRILLATION ABLATION N/A 01/28/2021   Procedure: ATRIAL FIBRILLATION ABLATION;  Surgeon: Constance Haw, MD;  Location: [facility];  Service: Cardiovascular;  Laterality: N/A;   CARDIOVERSION N/A 10/30/2020   Procedure: CARDIOVERSION;  Surgeon: Jerline Pain, MD;  Location: [facility] ENDOSCOPY;  Service: Cardiovascular;  Laterality: N/A;   COLONOSCOPY     FOOT FRACTURE SURGERY  10/2013   TONSILLECTOMY AND ADENOIDECTOMY     TOTAL KNEE ARTHROPLASTY Left 03/22/2016   Procedure: TOTAL KNEE ARTHROPLASTY;  Surgeon: Dorna Leitz, MD;  Location: [facility];  Service: Orthopedics;  Laterality: Left;   TUBAL LIGATION  1981   VAGINAL HYSTERECTOMY  04/20/05   prolapse, adenomyosis     reports that she has never smoked. She has never used smokeless tobacco. She reports current alcohol use of about 4.0 - 6.0 standard drinks per week. She reports that she does not use drugs.  Allergies  Allergen Reactions   Gluten Meal Other (See Comments)    celiac disease   Cefdinir Rash    Family History  Problem Relation Age of Onset   Osteoporosis Mother    Stroke Mother        mini   Prostate cancer Father    Heart disease Brother 46       shunt put in heart   Osteoarthritis Maternal Grandmother    Bipolar disorder Son    Liver disease Son    Breast  cancer Cousin        1st cousin   Colon cancer Neg Hx     Prior to Admission medications   Medication Sig Start Date End Date Taking? Authorizing Provider  acetaminophen (TYLENOL) 160 MG/5ML solution Take 20.3 mLs (650 mg total) by mouth every 6 (six) hours as needed for mild pain, moderate pain or fever. 03/04/21   [name], MD  albuterol (PROVENTIL) (2.5 MG/3ML) 0.083% nebulizer solution Take 3 mLs (2.5 mg total) by nebulization every 4 (four) hours as needed for wheezing or shortness of breath. 03/04/21   [name], MD   amiodarone (PACERONE) 200 MG tablet Take 1 tablet (200 mg total) by mouth daily. 03/04/21   [name], MD  bethanechol (URECHOLINE) 10 MG tablet Take 2 tablets (20 mg total) by mouth 3 (three) times daily. 03/04/21   [name], MD  chlorhexidine (PERIDEX) 0.12 % solution 15 mLs by Mouth Rinse route 2 (two) times daily. 03/04/21   [name], MD  Chlorhexidine Gluconate Cloth 2 % PADS Apply 6 each topically daily. 03/04/21   [name], MD  cyclobenzaprine (FLEXERIL) 5 MG tablet Take 1 tablet (5 mg total) by mouth 2 (two) times daily as needed for muscle spasms. 03/04/21   [name], MD  docusate (COLACE) 50 MG/5ML liquid Take 10 mLs (100 mg total) by mouth 2 (two) times daily. 03/04/21   [name], MD  gabapentin (NEURONTIN) 250 MG/5ML solution Take 4 mLs (200 mg total) by mouth at bedtime. 03/04/21   [name], MD  guaiFENesin (ROBITUSSIN) 100 MG/5ML SOLN Take 15 mLs (300 mg total) by mouth every 6 (six) hours as needed for cough or to loosen phlegm. 03/04/21   [name], MD  latanoprost (XALATAN) 0.005 % ophthalmic solution Place 1 drop into the left eye at bedtime. 03/04/21   [name], MD  lip balm Valeria Batman) OINT Apply 1 application topically as needed for lip care. 03/04/21   [name], MD  melatonin 3 MG TABS tablet Take 1 tablet (3 mg total) by mouth at bedtime. 03/04/21   [name], MD  Nutritional Supplements (FEEDING SUPPLEMENT, OSMOLITE 1.5 CAL,) LIQD Place 720 mLs into feeding tube daily. 03/04/21   [name], MD  Nutritional Supplements (FEEDING SUPPLEMENT, PROSOURCE TF,) liquid Place 45 mLs into feeding tube 2 (two) times daily. 03/04/21   [name], MD  Nystatin (GERHARDT'S BUTT CREAM) CREA Apply 1 application topically as needed for irritation. 03/04/21   [name], MD  oxyCODONE (OXY IR/ROXICODONE) 5 MG immediate release tablet Take 0.5 tablets (2.5 mg total) by mouth every 3 (three) hours as needed for moderate pain. 03/04/21    [name], MD  pantoprazole (PROTONIX) 40 MG tablet Take 1 tablet (40 mg total) by mouth daily. 03/04/21   [name], MD  polyethylene glycol (MIRALAX / GLYCOLAX) 17 g packet Take 17 g by mouth daily. 03/04/21   [name], MD  polyvinyl alcohol (LIQUIFILM TEARS) 1.4 % ophthalmic solution Place 1 drop into both eyes as needed for dry eyes. 03/04/21   [name], MD  potassium chloride (KLOR-CON) 20 MEQ packet Take 60 mEq by mouth daily. 03/04/21   [name], MD  QUEtiapine (SEROQUEL) 25 MG tablet Take 1 tablet (25 mg total) by mouth at bedtime. 03/04/21   [name], MD  senna-docusate (SENOKOT-S) 8.6-50 MG tablet Take 1 tablet by  mouth at bedtime as needed for mild constipation. 03/04/21   [name], MD    Physical Exam: There were no vitals filed for this visit.  Physical Exam Constitutional:      General: She is not in acute distress. HENT:     Head: Normocephalic and atraumatic.  Eyes:     Extraocular Movements: Extraocular movements intact.     Conjunctiva/sclera: Conjunctivae normal.  Cardiovascular:     Rate and Rhythm: Normal rate and regular rhythm.     Pulses: Normal pulses.  Pulmonary:     Effort: Pulmonary effort is normal. No respiratory distress.     Breath sounds: Normal breath sounds. No wheezing or rales.  Abdominal:     General: Bowel sounds are normal. There is no distension.     Palpations: Abdomen is soft.     Tenderness: There is no abdominal tenderness.  Musculoskeletal:        General: No swelling or tenderness.     Cervical back: Normal range of motion and neck supple.  Skin:    General: Skin is warm and dry.  Neurological:     Mental Status: She is alert and oriented to person, place, and time.     Labs on Admission: I have personally reviewed following labs and imaging studies  CBC: Recent Labs  Lab 03/09/21 0543  WBC 5.6  HGB 12.0  HCT 36.3  MCV 97.8  PLT 440   Basic Metabolic Panel: Recent Labs  Lab  03/09/21 0543  NA 131*  K 3.8  CL 97*  CO2 26  GLUCOSE 90  BUN 10  CREATININE 0.41*  CALCIUM 8.6*   GFR: Estimated Creatinine Clearance: 52.5 mL/min (A) (by C-G formula based on SCr of 0.41 mg/dL (L)). Liver Function Tests: No results for input(s): AST, ALT, ALKPHOS, BILITOT, PROT, ALBUMIN in the last 168 hours. No results for input(s): LIPASE, AMYLASE in the last 168 hours. No results for input(s): AMMONIA in the last 168 hours. Coagulation Profile: No results for input(s): INR, PROTIME in the last 168 hours. Cardiac Enzymes: No results for input(s): CKTOTAL, CKMB, CKMBINDEX, TROPONINI in the last 168 hours. BNP (last 3 results) No results for input(s): PROBNP in the last 8760 hours. HbA1C: No results for input(s): HGBA1C in the last 72 hours. CBG: Recent Labs  Lab 03/06/21 2051 03/07/21 0009 03/07/21 0606 03/07/21 1151 03/07/21 1624  GLUCAP 107* 105* 101* 124* 96   Lipid Profile: No results for input(s): CHOL, HDL, LDLCALC, TRIG, CHOLHDL, LDLDIRECT in the last 72 hours. Thyroid Function Tests: No results for input(s): TSH, T4TOTAL, FREET4, T3FREE, THYROIDAB in the last 72 hours. Anemia Panel: No results for input(s): VITAMINB12, FOLATE, FERRITIN, TIBC, IRON, RETICCTPCT in the last 72 hours. Urine analysis:    Component Value Date/Time   COLORURINE YELLOW 02/06/2021 1107   APPEARANCEUR CLEAR 02/06/2021 1107   LABSPEC 1.043 (H) 02/06/2021 1107   PHURINE 6.0 02/06/2021 1107   GLUCOSEU 50 (A) 02/06/2021 1107   HGBUR NEGATIVE 02/06/2021 1107   BILIRUBINUR NEGATIVE 02/06/2021 1107   KETONESUR 20 (A) 02/06/2021 1107   PROTEINUR NEGATIVE 02/06/2021 1107   UROBILINOGEN 0.2 12/01/2013 1339   NITRITE NEGATIVE 02/06/2021 1107   LEUKOCYTESUR NEGATIVE 02/06/2021 1107    Radiological Exams on Admission: No results found.  EKG: Independently reviewed.  Atrial flutter.  Assessment/Plan Principal Problem:   Atrial flutter with rapid ventricular response (HCC) Active  Problems:   GBS (Guillain Barre syndrome) (HCC)   Hyponatremia   Chronic pain syndrome  Urinary retention   Atrial flutter with RVR Patient with history of A. fib and underwent recent ablation on 8/3.  She is on Eliquis and oral amiodarone.  Metoprolol was discontinued due to hypotension.  Tonight she developed persistent tachycardia with heart rate in the 110s but remained asymptomatic.  EKG showing atrial flutter.  Given hypotension with systolic in 53I and patient was given 1 L fluid bolus.  On-call cardiologist recommended giving amiodarone bolus and she will have EP team follow-up on the patient in the morning.  IV amiodarone could not be administered on 52M and patient was transferred to 4E progressive care unit.  Notified by RN that when patient arrived to progressive care unit, her blood pressure had already improved and she had converted back to sinus rhythm after finishing fluid bolus.. -Resume Eliquis and oral amiodarone after pharmacy med rec is done.  Check TSH level.  Repeat EKG ordered to assess rhythm.  EP team to see the patient in the morning.  Chronic pain/scoliosis -Resume lidocaine patche, gabapentin HS, low-dose oxycodone PRN after pharmacy med rec is done.  Urinary retention -Resume Urecholine and Flomax after pharmacy med rec is done.  History of dysphagia Currently on regular diet.  History of respiratory failure secondary to aspiration event requiring intubation/tracheostomy Lurline Idol out 2 days ago.  Currently off oxygen. 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  Sodium improving from nadir of 115.  Sodium 131 on 9/12. -Continue to monitor  GBS Neurology was following and patient finished IVIG treatments during recent hospitalization.  Subsequently admitted to CIR. -PT/OT  DVT prophylaxis: Resume Eliquis after pharmacy med rec is done. Code Status: Patient wishes to be  full code. Family Communication: No family available at this time. Disposition Plan: Status is: Inpatient  Remains inpatient appropriate because:Inpatient level of care appropriate due to severity of illness  Dispo: The patient is from:  CIR              Anticipated d/c is to: CIR              Patient currently is not medically stable to d/c.   Difficult to place patient No  Level of care: Progressive care unit  The medical decision making on this patient was of high complexity and the patient is at high risk for clinical deterioration, therefore this is a level 3 visit.  Shela Leff MD [facility]  If 7PM-7AM, please contact night-coverage [email or web address]  03/13/2021, 1:47 AM

## 2021-03-13 NOTE — Progress Notes (Signed)
New orders received from [facility] ([place]) to begin 0.9% Nacl bolus at 500cc/hr for 30 mins. Pt. Is stable with no changes from baseline. A/O X4 and able to express needs with clear speech.

## 2021-03-13 NOTE — Discharge Summary (Signed)
Physician Discharge Summary  Patient ID: Angela Morales MRN: [id number] DOB/AGE: June 14, 1946 75 y.o.  Admit date: 03/04/2021 Discharge date: 03/13/2021  Discharge Diagnoses:  Principal Problem:   Atrial flutter with rapid ventricular response (HCC) Active Problems:   Hypotension   Chronic anticoagulation   GBS (Guillain-Barre syndrome) (HCC)   Chronic pain syndrome   Urinary retention   Discharged Condition: Serious   Significant Diagnostic Studies: N/A   Labs:  Basic Metabolic Panel: BMP Latest Ref Rng & Units 03/09/2021 03/05/2021 03/03/2021  Glucose 70 - 99 mg/dL 90 97 125(H)  BUN 8 - 23 mg/dL 10 16 15   Creatinine 0.44 - 1.00 mg/dL 0.41(L) 0.40(L) 0.41(L)  BUN/Creat Ratio 12 - 28 - - -  Sodium 135 - 145 mmol/L 131(L) 131(L) 130(L)  Potassium 3.5 - 5.1 mmol/L 3.8 3.7 4.0  Chloride 98 - 111 mmol/L 97(L) 98 97(L)  CO2 22 - 32 mmol/L 26 27 26   Calcium 8.9 - 10.3 mg/dL 8.6(L) 8.5(L) 8.5(L)     CBC: CBC Latest Ref Rng & Units 03/09/2021 03/05/2021 02/25/2021  WBC 4.0 - 10.5 K/uL 5.6 6.9 5.6  Hemoglobin 12.0 - 15.0 g/dL 12.0 11.8(L) 10.2(L)  Hematocrit 36.0 - 46.0 % 36.3 36.1 31.3(L)  Platelets 150 - 400 K/uL 281 416(H) 341     CBG: No results for input(s): GLUCAP in the last 168 hours.   Brief HPI:   Angela Morales is a 75 y.o. female with history of A. fib s/p recent ablation, severe lumbar scoliosis, celiac disease, SVT who was admitted on 02/06/2021 with 4-day history of tingling in hands and feet progressing to diffuse weakness with numbness right greater than left and inability to walk.  Neurology was consulted for input and she was started on IVIG for AIDP.  She did continue to worsen with increased WOB and concerns of aspiration event with supper as well as bouts of confusion and agitation felt to be due to hypoxic hypercarbic respiratory failure.  She required intubation on 08/14 and was treated with course of Zosyn for aspiration pneumonia.    Hospital course was  significant for A. fib with RVR, progressive hyponatremia in setting of GBS, dysphagia, early acute cholecystitis, as well as brief episode of unresponsiveness with hypotension felt to be related to beta-blocker and Seroquel.  She did require tracheostomy and was able to tolerate slow wean to ATC and was decannulated by 09/06.  She continued to have limitations due to diffuse weakness with balance deficits and was working on.  Activity and balance at edge of bed.  CIR was recommended due to functional decline.   Hospital Course: Angela Morales was admitted to rehab 03/04/2021 for inpatient therapies to consist of PT and OT at least three hours five days a week. Past admission physiatrist, therapy team and rehab RN have worked together to provide customized collaborative inpatient rehab.  Her blood pressures were monitored on TID basis and were reasonably controlled.  Metoprolol was discontinued due to hypotension.  She was maintained on K-Dur for potassium supplementation.   NG tube was removed on 09/12 as intake was noted to be good.  She was maintained on aspiration precautions and was tolerating regular diet without difficulty.  She was started on schedule tolerating program for urinary retention.  Urecholine was titrated up to 25 mg 3 times daily and Flomax was added additionally to help with voiding function.  Pain was managed with as needed use of oxycodone and gabapentin was titrated upwards to 40 mg nightly  to help neuropathic symptoms.  Seroquel was discontinued to prevent a.m. lethargy with melatonin use to help with sleep-wake disruption.  During patient's stay in rehab weekly team conferences were held to monitor for progress, set goals and discuss barriers to discharge.  She was making gain with CIR and required supervision to mod assist for ADL tasks. She reported occasional dizziness with movement and with inconsistencies on vestibular evaluation. She did have impaired convergence without  diplopia and saccadic movements /VOR were [facility] with recommendations to work on convergence and exercises/activities that could potentially reproduce these symptoms.  She required supervision for upper body bathing and dressing at bed level.  She required mod to total assist for lower body care.  She is requiring total assist for sliding board for transfers.  On 09/16 pm,  patient was noted to have persistent tachycardia with heart rate in 110s.  She was asymptomatic however EKG showed evidence of a flutter with heart rate sustaining 1 10-1 28 range.  Cardiology was consulted for input and recommended use of diltiazem or bolus of amiodarone.  She did develop hypotension requiring fluid bolus.  Dr. Marlowe Sax with [facility] was contacted to assist with management and for transfer to telemetry for monitoring.  She was discharged to acute hospital on 09/16.    Discharge disposition: [facility] Not Defined  Diet: Heart healthy  Medications at discharge: 1.  Amiodarone 200 mg p.o. per day 2.  Eliquis 5 mg p.o. twice daily 3.  Bethanechol 25 mg p.o. 3 times daily 4.  Flexeril 5 mg p.o. twice daily as needed. 5.  Gabapentin 4 mg p.o. nightly 6.  Xalatan 0.005% 1 GTT OS nightly 7.  Lidocaine patch to back as needed 8.  Oxycodone 2.5 mg p.o. daily and every 3 hours as needed 9.  Melatonin 3 mg p.o. nightly 10.  Protonix 40 mg p.o. per day. 11.  MiraLAX 17 g p.o. daily 12.  K Dur 60 mEq p.o. daily 13.  Flomax 0.8 mg daily after supper 14.  Timolol 0.5% 1 GTT OS twice daily 15.  Boost supplement 4 times daily   Signed: Bary Leriche 03/16/2021, 4:59 PM

## 2021-03-13 NOTE — Progress Notes (Signed)
Pt. Discharged and Transferred to [facility]. Around 0345. Pt. Is sable. A/O X3 with clear speech. Report given to Darryl RN.  2.58m oxycodone wasted with TDenny PeonRN. Responsible party notified by phone message(voicemail) to call the rehab unit for updates.

## 2021-03-13 NOTE — Progress Notes (Signed)
PROGRESS NOTE    Angela Morales   EGB:[id number]  DOB: 1945/07/06  DOA: 03/13/2021 PCP: Deland Pretty, MD   Brief Narrative:  Angela Morales 75 year old female with atrial fibrillation status post ablation, SVT, lumbar scoliosis, celiac disease who recently was hospitalized for Guillain-Barr syndrome.  Hospitalization was complicated neuromuscular respiratory failure requiring intubation, septic shock, staph aureus pneumonia (right lower lobe), tracheostomy, hyponatremia and A. fib with RVR.  The patient was subsequently discharged to [facility] inpatient rehab be treated for severe deconditioning. While in CIR, the patient developed persistent tachycardia and EKG revealed atrial flutter.  [facility] was contacted and patient was readmitted to [facility].  She was started on an Amiodarone infusion and given 1 L fluid bolus for hypotension. Cardiology was consulted.  Subjective: The patient had no complaints when evaluated this morning.  She admits to still being quite weak and having weakness specifically in her legs along with occasional muscle cramping and paresthesias.    Assessment & Plan:   Principal Problem:   Atrial flutter with rapid ventricular response (paroxysmal) -Ablated 8/3 -He has been receiving 200 mg of amiodarone daily while at CIR --Continue amiodarone infusion per cardiology/electrophysiology - Continue Eliquis  Active Problems:   GBS (Guillain Barre syndrome)  Chronic pain and degenerative disc disease -Given 2 rounds of IVIG by neurology during prior hospital admission -Per notes, Guillain-Barr was idiopathic versus postprocedural related to cardiac ablation -She was decannulated on 9/6 and cor track removed -Continue to work with PT as able with plan to return to CIR once stable -Can continue to use Neurontin, Cymbalta and low-dose oxycodone as needed to control pain - Continue Flexeril twice daily to control spasticity     Hyponatremia-SIADH - She has received IV fluids (normal saline) overnight and continues to receive them today -We will DC fluids and begin to fluid restrict    Urinary retention -She receives bethanechol and Flomax but states that she still, intermittently, has required in and out catheterizations due to inability to urinate - On exam this morning her bladder is full but she does not feel an urge to urinate - Continue bethanechol and Flomax and follow  Insomnia - Start trial of trazodone  Celiac disease - Gluten-free diet ordered  Glaucoma - Continue timolol   Time spent in minutes: 40 DVT prophylaxis:  apixaban (ELIQUIS) tablet 5 mg  Code Status: Full code Family Communication:  Level of Care: Level of care: Progressive Disposition Plan:  Status is: Inpatient  Remains inpatient appropriate because:IV treatments appropriate due to intensity of illness or inability to take PO and Inpatient level of care appropriate due to severity of illness  Dispo: The patient is from: Home              Anticipated d/c is to: CIR              Patient currently is not medically stable to d/c.   Difficult to place patient No      Consultants:  EP Procedures:  none Antimicrobials:  Anti-infectives (From admission, onward)    None        Objective: Vitals:   03/13/21 0804  BP: (!) 82/59  Pulse: (!) 118  Resp: 14  Temp: 97.6 F (36.4 C)  TempSrc: Oral  SpO2: 96%   No intake or output data in the 24 hours ending 03/13/21 1210 There were no vitals filed for this visit.  Examination: General exam: Appears comfortable  HEENT: PERRLA, oral mucosa moist, no  sclera icterus or thrush Respiratory system: Clear to auscultation. Respiratory effort normal. Cardiovascular system: S1 & S2 heard, RRR. Tachycardic with HR in 120s  Gastrointestinal system: Abdomen soft, non-tender, nondistended. Normal bowel sounds. Central nervous system: Alert and oriented.4/5 strength in lower  extremities, 5/5 in upper Extremities: No cyanosis, clubbing or edema Skin: No rashes or ulcers Psychiatry:  Mood & affect appropriate.     Data Reviewed: I have personally reviewed following labs and imaging studies  CBC: Recent Labs  Lab 03/09/21 0543  WBC 5.6  HGB 12.0  HCT 36.3  MCV 97.8  PLT 263   Basic Metabolic Panel: Recent Labs  Lab 03/09/21 0543 03/13/21 0907  NA 131*  --   K 3.8  --   CL 97*  --   CO2 26  --   GLUCOSE 90  --   BUN 10  --   CREATININE 0.41*  --   CALCIUM 8.6*  --   MG  --  2.0   GFR: Estimated Creatinine Clearance: 52.5 mL/min (A) (by C-G formula based on SCr of 0.41 mg/dL (L)). Liver Function Tests: No results for input(s): AST, ALT, ALKPHOS, BILITOT, PROT, ALBUMIN in the last 168 hours. No results for input(s): LIPASE, AMYLASE in the last 168 hours. No results for input(s): AMMONIA in the last 168 hours. Coagulation Profile: No results for input(s): INR, PROTIME in the last 168 hours. Cardiac Enzymes: No results for input(s): CKTOTAL, CKMB, CKMBINDEX, TROPONINI in the last 168 hours. BNP (last 3 results) No results for input(s): PROBNP in the last 8760 hours. HbA1C: No results for input(s): HGBA1C in the last 72 hours. CBG: Recent Labs  Lab 03/06/21 2051 03/07/21 0009 03/07/21 0606 03/07/21 1151 03/07/21 1624  GLUCAP 107* 105* 101* 124* 96   Lipid Profile: No results for input(s): CHOL, HDL, LDLCALC, TRIG, CHOLHDL, LDLDIRECT in the last 72 hours. Thyroid Function Tests: No results for input(s): TSH, T4TOTAL, FREET4, T3FREE, THYROIDAB in the last 72 hours. Anemia Panel: No results for input(s): VITAMINB12, FOLATE, FERRITIN, TIBC, IRON, RETICCTPCT in the last 72 hours. Urine analysis:    Component Value Date/Time   COLORURINE YELLOW 02/06/2021 1107   APPEARANCEUR CLEAR 02/06/2021 1107   LABSPEC 1.043 (H) 02/06/2021 1107   PHURINE 6.0 02/06/2021 1107   GLUCOSEU 50 (A) 02/06/2021 1107   HGBUR NEGATIVE 02/06/2021 1107    BILIRUBINUR NEGATIVE 02/06/2021 1107   KETONESUR 20 (A) 02/06/2021 1107   PROTEINUR NEGATIVE 02/06/2021 1107   UROBILINOGEN 0.2 12/01/2013 1339   NITRITE NEGATIVE 02/06/2021 1107   LEUKOCYTESUR NEGATIVE 02/06/2021 1107   Sepsis Labs: @LABRCNTIP (procalcitonin:4,lacticidven:4) )No results found for this or any previous visit (from the past 240 hour(s)).       Radiology Studies: No results found.    Scheduled Meds:  amiodarone  150 mg Intravenous Once   apixaban  5 mg Oral BID   bethanechol  20 mg Oral TID   cyclobenzaprine  5 mg Oral BID   feeding supplement  1 Container Oral BID BM   gabapentin  400 mg Oral QHS   latanoprost  1 drop Left Eye QHS   pantoprazole  40 mg Oral Daily   polyethylene glycol  17 g Oral Daily   potassium chloride  60 mEq Oral Daily   timolol  1 drop Left Eye BID   Continuous Infusions:  sodium chloride 75 mL/hr at 03/13/21 0917   amiodarone     Followed by   amiodarone       LOS: 0  days      Debbe Odea, MD [facility] Pager: [email or web address] 03/13/2021, 12:10 PM

## 2021-03-13 NOTE — Progress Notes (Signed)
PT Cancellation Note  Patient Details Name: ALEAYA LATONA MRN: [id number] DOB: 1946/01/17   Cancelled Treatment:    Reason Eval/Treat Not Completed: Medical issues which prohibited therapy - MD in room requesting PT/OT hold on evaluation since pt HR in 120s awaiting EKG. Will follow-up for PT evaluation as schedule permits.  Mabeline Caras, PT, DPT Acute Rehabilitation Services  Pager [phone] Office [place] 03/13/2021, 9:31 AM

## 2021-03-13 NOTE — Progress Notes (Signed)
OT Cancellation Note  Patient Details Name: Angela Morales MRN: [id number] DOB: 1946/05/09   Cancelled Treatment:    Reason Eval/Treat Not Completed: Medical issues which prohibited therapy. Patient in afib with RVR. Rates sustained in 120's at rest. OT to hold at this time per MD. Will check back as time allows.   Gloris Manchester OTR/L Supplemental OT, Department of rehab services [phone]  Leshawn Straka R H. 03/13/2021, 9:27 AM

## 2021-03-14 ENCOUNTER — Encounter (HOSPITAL_COMMUNITY): Payer: Self-pay | Admitting: Internal Medicine

## 2021-03-14 DIAGNOSIS — I4892 Unspecified atrial flutter: Secondary | ICD-10-CM | POA: Diagnosis not present

## 2021-03-14 LAB — CBC
HCT: 38.2 % (ref 36.0–46.0)
Hemoglobin: 12.3 g/dL (ref 12.0–15.0)
MCH: 31.7 pg (ref 26.0–34.0)
MCHC: 32.2 g/dL (ref 30.0–36.0)
MCV: 98.5 fL (ref 80.0–100.0)
Platelets: 210 10*3/uL (ref 150–400)
RBC: 3.88 MIL/uL (ref 3.87–5.11)
RDW: 14.8 % (ref 11.5–15.5)
WBC: 6.4 10*3/uL (ref 4.0–10.5)
nRBC: 0 % (ref 0.0–0.2)

## 2021-03-14 LAB — BASIC METABOLIC PANEL
Anion gap: 9 (ref 5–15)
BUN: 5 mg/dL — ABNORMAL LOW (ref 8–23)
CO2: 25 mmol/L (ref 22–32)
Calcium: 8.4 mg/dL — ABNORMAL LOW (ref 8.9–10.3)
Chloride: 100 mmol/L (ref 98–111)
Creatinine, Ser: 0.41 mg/dL — ABNORMAL LOW (ref 0.44–1.00)
GFR, Estimated: >60 mL/min (ref >60)
Glucose, Bld: 94 mg/dL (ref 70–99)
Potassium: 3.5 mmol/L (ref 3.5–5.1)
Sodium: 134 mmol/L — ABNORMAL LOW (ref 135–145)

## 2021-03-14 NOTE — Progress Notes (Deleted)
Physical Therapy Note  Patient Details  Name: Angela Morales MRN: [id number] Date of Birth: 01-Jun-1946 Today's Date: 03/14/2021    Physical Therapy Discharge Note  This patient was unable to complete the inpatient rehab program due to transferring back to acute care due to cardiac issues; therefore did not meet their long term goals. Pt left the program at a max assist level for their functional mobility/ transfers. This patient is being discharged from PT services at this time.  Pt's perception of pain in the last five days was unable to answer at this time.    See CareTool for functional status details  If the patient is able to return to inpatient rehabilitation within 3 midnights, this may be considered an interrupted stay and therapy services will resume as ordered. Modification and reinstatement of their goals will be made upon completion of therapy service reevaluations.      Excell Seltzer, PT, DPT, CSRS  03/14/2021, 9:59 AM

## 2021-03-14 NOTE — Evaluation (Signed)
Physical Therapy Evaluation Patient Details Name: Angela Morales MRN: [id number] DOB: 11-07-1945 Today's Date: 03/14/2021  History of Present Illness  The pt is a 75 y.o. female readmitted from [place] on 9/16 for persistent tachycardia with heart rate in the 110s but remained asymptomatic. Of note, pt d/c to CIR on 9/7 after admission from 8/11-9/7 due to GBS and hospital course complicated by respiratory failure secondary to aspiration event which required intubation and tracheostomy placement.  Hospital course also complicated by severe hyponatremia, A. fib with RVR. PMH includes: A. fib status post recent ablation on 8/3, SVT, severe lumbar scoliosis (not ambulatory surgery), celiac disease.   Clinical Impression  Pt in bed upon arrival of PT, agreeable to evaluation at this time. The pt now presents with limitations in functional mobility, strength, stability, and muscular endurance due to above dx, and will continue to benefit from skilled PT to address these deficits. The pt was able to assist with all movements for bed mobility and transfer to her WC with use of slide board. Due to ongoing debility from GBS, continues to require modA of 2 for safety with mobility and will benefit from return to CIR when medically stable for continued rehab efforts prior to return home. The pt is highly motivated and eager to return to more intensive therapies.         Recommendations for follow up therapy are one component of a multi-disciplinary discharge planning process, led by the attending physician.  Recommendations may be updated based on patient status, additional functional criteria and insurance authorization.  Follow Up Recommendations CIR    Equipment Recommendations  None recommended by PT (defer to post acute)    Recommendations for Other Services Rehab consult     Precautions / Restrictions Precautions Precautions: Fall Precaution Comments: bladder/bowel incontinence Required Braces or  Orthoses: Other Brace Other Brace: bilateral ankle air splints. Restrictions Weight Bearing Restrictions: No      Mobility  Bed Mobility Overal bed mobility: Needs Assistance Bed Mobility: Rolling;Sidelying to Sit Rolling: Mod assist Sidelying to sit: Mod assist;+2 for safety/equipment       General bed mobility comments: modA to initiate positioning for rolling and to complete movement, modA of 2 to complete movement of BLE to EOB and elevation of trunk. assist to scoot to EOB    Transfers Overall transfer level: Needs assistance Equipment used: Sliding board Transfers: Lateral/Scoot Transfers          Lateral/Scoot Transfers: Mod assist;+2 physical assistance;+2 safety/equipment;With slide board General transfer comment: modA of 2 iwht use of slide board. assist to move BLE and reposition to maintain safe positioning. modA with use of bed pad to scoot along slide board  Ambulation/Gait             General Gait Details: pt unable      Balance Overall balance assessment: Needs assistance Sitting-balance support: Feet supported Sitting balance-Leahy Scale: Fair Sitting balance - Comments: Able to maintain static sitting EOB with BUE support and min guard, w/o UE support x 30 seconds.                                     Pertinent Vitals/Pain Pain Assessment: Faces Faces Pain Scale: Hurts a little bit Pain Location: R hip Pain Descriptors / Indicators: Guarding;Grimacing;Pins and needles;Discomfort Pain Intervention(s): Monitored during session    Home Living Family/patient expects to be discharged to:: Inpatient rehab Living  Arrangements: Spouse/significant other;Children Available Help at Discharge: Family;Available PRN/intermittently Type of Home: House Home Access: Stairs to enter Entrance Stairs-Rails: Left Entrance Stairs-Number of Steps: 3-4 Home Layout: Multi-level;Able to live on main level with bedroom/bathroom;Full bath on main  level Home Equipment: None Additional Comments: Lives with husband and son; husband works    Prior Function Level of Independence: Independent         Comments: Per chart: Independent without DME; retired [employer]; drives; enjoys babysitting grandkids (30 and 43 y.o.), enjoys time with cat and dog, watching tv     Hand Dominance   Dominant Hand: Left    Extremity/Trunk Assessment   Upper Extremity Assessment Upper Extremity Assessment: Defer to OT evaluation RUE Deficits / Details: 3/5 grossly strength throughout. Poor pinch strength with numbness and tingling RUE Sensation: decreased light touch;decreased proprioception RUE Coordination: decreased fine motor;decreased gross motor LUE Deficits / Details: 3/5 strength throughout. Poor pinch strength and numbness and tingling LUE Sensation: decreased light touch;decreased proprioception LUE Coordination: decreased fine motor;decreased gross motor    Lower Extremity Assessment Lower Extremity Assessment: RLE deficits/detail;LLE deficits/detail RLE Deficits / Details: 2/5 for ankle DF and PF, 2-/5 for ankle eversion, 3/5 knee flexion and extension, is able to complete against gravity with max encouragement and effort RLE Sensation: decreased light touch;decreased proprioception RLE Coordination: decreased fine motor;decreased gross motor LLE Deficits / Details: 2/5 for ankle DF and PF, 2-/5 for ankle eversion, 2/5 knee flexion and extension LLE Sensation: decreased light touch;decreased proprioception LLE Coordination: decreased fine motor;decreased gross motor    Cervical / Trunk Assessment Cervical / Trunk Assessment: Other exceptions Cervical / Trunk Exceptions: H/o scoliosis and arthritis  Communication   Communication: No difficulties  Cognition Arousal/Alertness: Awake/alert Behavior During Therapy: WFL for tasks assessed/performed Overall Cognitive Status: Within Functional Limits for tasks  assessed                                 General Comments: pt able to follow all commands and demo good safety awareness and recall of techniques      General Comments General comments (skin integrity, edema, etc.): VSS on RA     PT Assessment Patient needs continued PT services  PT Problem List Decreased strength;Decreased range of motion;Decreased activity tolerance;Decreased balance;Decreased mobility;Decreased coordination;Decreased knowledge of use of DME;Decreased knowledge of precautions;Impaired sensation;Impaired tone       PT Treatment Interventions DME instruction;Gait training;Functional mobility training;Stair training;Therapeutic activities;Therapeutic exercise;Balance training;Neuromuscular re-education;Cognitive remediation;Patient/family education;Wheelchair mobility training    PT Goals (Current goals can be found in the Care Plan section)  Acute Rehab PT Goals Patient Stated Goal: Continue with rehab PT Goal Formulation: With patient/family Time For Goal Achievement: 03/28/21 Potential to Achieve Goals: Good    Frequency Min 4X/week   Barriers to discharge        Co-evaluation PT/OT/SLP Co-Evaluation/Treatment: Yes Reason for Co-Treatment: Complexity of the patient's impairments (multi-system involvement);Necessary to address cognition/behavior during functional activity;For patient/therapist safety;To address functional/ADL transfers PT goals addressed during session: Mobility/safety with mobility;Balance;Proper use of DME;Strengthening/ROM OT goals addressed during session: ADL's and self-care;Strengthening/ROM       AM-PAC PT "6 Clicks" Mobility  Outcome Measure Help needed turning from your back to your side while in a flat bed without using bedrails?: A Lot Help needed moving from lying on your back to sitting on the side of a flat bed without using bedrails?: A Lot Help needed moving to  and from a bed to a chair (including a  wheelchair)?: Total Help needed standing up from a chair using your arms (e.g., wheelchair or bedside chair)?: Total Help needed to walk in hospital room?: Total Help needed climbing 3-5 steps with a railing? : Total 6 Click Score: 8    End of Session Equipment Utilized During Treatment: Gait belt Activity Tolerance: Patient tolerated treatment well;Patient limited by fatigue Patient left: in chair;with call bell/phone within reach;with nursing/sitter in room Nurse Communication: Mobility status PT Visit Diagnosis: Other abnormalities of gait and mobility (R26.89);Muscle weakness (generalized) (M62.81);Other symptoms and signs involving the nervous system (Q42.103)    Time: 1281-1886 PT Time Calculation (min) (ACUTE ONLY): 37 min   Charges:   PT Evaluation $PT Eval Moderate Complexity: 1 Mod          West Carbo, PT, DPT   Acute Rehabilitation Department Pager #: [phone]  Sandra Cockayne 03/14/2021, 2:52 PM

## 2021-03-14 NOTE — Progress Notes (Signed)
PROGRESS NOTE    [name]   CXK:[id number]  DOB: 02-Jun-1946  DOA: 03/13/2021 PCP: Deland Pretty, MD   Brief Narrative:  Angela Morales 75 year old female with atrial fibrillation status post ablation, SVT, lumbar scoliosis, celiac disease who recently was hospitalized for Guillain-Barr syndrome.  Hospitalization was complicated neuromuscular respiratory failure requiring intubation, septic shock, staph aureus pneumonia (right lower lobe), tracheostomy, hyponatremia and A. fib with RVR.  The patient was subsequently discharged to [facility] inpatient rehab be treated for severe deconditioning. While in CIR, the patient developed persistent tachycardia and EKG revealed atrial flutter.  [facility] was contacted and patient was readmitted to [facility].  She was started on an Amiodarone infusion and given 1 L fluid bolus for hypotension. Cardiology was consulted.  Subjective: She has no complaints today. The Trazodone worked well for her last night.     Assessment & Plan:   Principal Problem:   Atrial flutter with rapid ventricular response (paroxysmal) -Ablated 8/3 -He has been receiving 200 mg of amiodarone daily while at CIR --Continue amiodarone infusion per cardiology/electrophysiology - Continue Eliquis - plan for cardioversion on Monday  Active Problems:   GBS (Guillain Barre syndrome)  Chronic pain and degenerative disc disease -Given 2 rounds of IVIG by neurology during prior hospital admission -Per notes, Guillain-Barr was idiopathic versus postprocedural related to cardiac ablation -She was decannulated on 9/6 and cor track removed -Continue to work with PT as able with plan to return to CIR once stable -Can continue to use Neurontin, Cymbalta and low-dose oxycodone as needed to control pain - Continue Flexeril twice daily to control spasticity    Hyponatremia-SIADH - sodium 131> 134 - She received IV fluids (normal saline) initially - I dc'd  fluids on 9/16  - continue to fluid restrict and follow sodium intermittently    Urinary retention -She receives bethanechol and Flomax but states that she still, intermittently, has required in and out catheterizations due to inability to urinate - Continue bethanechol and Flomax   Insomnia - Started trial of trazodone- working well  Celiac disease - Gluten-free diet ordered  Glaucoma - Continue timolol   Time spent in minutes: 30 DVT prophylaxis:  apixaban (ELIQUIS) tablet 5 mg  Code Status: Full code Family Communication:  Level of Care: Level of care: Progressive Disposition Plan:  Status is: Inpatient  Remains inpatient appropriate because:IV treatments appropriate due to intensity of illness or inability to take PO and Inpatient level of care appropriate due to severity of illness  Dispo: The patient is from: Home              Anticipated d/c is to: CIR              Patient currently is not medically stable to d/c.   Difficult to place patient No  Consultants:  EP Procedures:  none Antimicrobials:  Anti-infectives (From admission, onward)    None        Objective: Vitals:   03/14/21 0000 03/14/21 0400 03/14/21 0800 03/14/21 1213  BP: 106/65 93/67 99/68  120/79  Pulse: 90 90 (!) 106 (!) 120  Resp: 19 (!) 24 20 20   Temp:  97.8 F (36.6 C)  98.1 F (36.7 C)  TempSrc:  Oral  Oral  SpO2: 97% 98% 99% 97%  Weight:      Height:        Intake/Output Summary (Last 24 hours) at 03/14/2021 1457 Last data filed at 03/14/2021 1201 Gross per 24 hour  Intake  1151.38 ml  Output 900 ml  Net 251.38 ml   Filed Weights   03/13/21 2224  Weight: 64 kg    Examination: General exam: Appears comfortable  HEENT: PERRLA, oral mucosa moist, no sclera icterus or thrush Respiratory system: Clear to auscultation. Respiratory effort normal. Cardiovascular system: S1 & S2 heard, regular rate and rhythm Gastrointestinal system: Abdomen soft, non-tender, nondistended.  Normal bowel sounds   Central nervous system: Alert and oriented. 4/5 strength in LE, 5/5 in UE.  Extremities: No cyanosis, clubbing or edema Skin: No rashes or ulcers Psychiatry:  Mood & affect appropriate.      Data Reviewed: I have personally reviewed following labs and imaging studies  CBC: Recent Labs  Lab 03/09/21 0543 03/13/21 1220 03/14/21 0131  WBC 5.6 5.7 6.4  HGB 12.0 12.0 12.3  HCT 36.3 36.1 38.2  MCV 97.8 98.1 98.5  PLT 281 201 947    Basic Metabolic Panel: Recent Labs  Lab 03/09/21 0543 03/13/21 0907 03/13/21 1220 03/14/21 0131  NA 131*  --  134* 134*  K 3.8  --  4.3 3.5  CL 97*  --  101 100  CO2 26  --  26 25  GLUCOSE 90  --  99 94  BUN 10  --  5* 5*  CREATININE 0.41*  --  0.45 0.41*  CALCIUM 8.6*  --  8.3* 8.4*  MG  --  2.0  --   --     GFR: Estimated Creatinine Clearance: 52.5 mL/min (A) (by C-G formula based on SCr of 0.41 mg/dL (L)). Liver Function Tests: No results for input(s): AST, ALT, ALKPHOS, BILITOT, PROT, ALBUMIN in the last 168 hours. No results for input(s): LIPASE, AMYLASE in the last 168 hours. No results for input(s): AMMONIA in the last 168 hours. Coagulation Profile: No results for input(s): INR, PROTIME in the last 168 hours. Cardiac Enzymes: No results for input(s): CKTOTAL, CKMB, CKMBINDEX, TROPONINI in the last 168 hours. BNP (last 3 results) No results for input(s): PROBNP in the last 8760 hours. HbA1C: No results for input(s): HGBA1C in the last 72 hours. CBG: Recent Labs  Lab 03/07/21 1624  GLUCAP 96    Lipid Profile: No results for input(s): CHOL, HDL, LDLCALC, TRIG, CHOLHDL, LDLDIRECT in the last 72 hours. Thyroid Function Tests: No results for input(s): TSH, T4TOTAL, FREET4, T3FREE, THYROIDAB in the last 72 hours. Anemia Panel: No results for input(s): VITAMINB12, FOLATE, FERRITIN, TIBC, IRON, RETICCTPCT in the last 72 hours. Urine analysis:    Component Value Date/Time   COLORURINE YELLOW 02/06/2021  1107   APPEARANCEUR CLEAR 02/06/2021 1107   LABSPEC 1.043 (H) 02/06/2021 1107   PHURINE 6.0 02/06/2021 1107   GLUCOSEU 50 (A) 02/06/2021 1107   HGBUR NEGATIVE 02/06/2021 1107   BILIRUBINUR NEGATIVE 02/06/2021 1107   KETONESUR 20 (A) 02/06/2021 1107   PROTEINUR NEGATIVE 02/06/2021 1107   UROBILINOGEN 0.2 12/01/2013 1339   NITRITE NEGATIVE 02/06/2021 1107   LEUKOCYTESUR NEGATIVE 02/06/2021 1107   Sepsis Labs: @LABRCNTIP (procalcitonin:4,lacticidven:4) )No results found for this or any previous visit (from the past 240 hour(s)).       Radiology Studies: No results found.    Scheduled Meds:  apixaban  5 mg Oral BID   bethanechol  20 mg Oral TID   cyclobenzaprine  5 mg Oral BID   feeding supplement  1 Container Oral BID BM   gabapentin  400 mg Oral QHS   latanoprost  1 drop Left Eye QHS   pantoprazole  40 mg  Oral Daily   polyethylene glycol  17 g Oral Daily   potassium chloride  60 mEq Oral Daily   timolol  1 drop Left Eye BID   Continuous Infusions:  amiodarone 30 mg/hr (03/14/21 1201)     LOS: 1 day      Debbe Odea, MD [facility] Pager: [email or web address] 03/14/2021, 2:57 PM

## 2021-03-14 NOTE — Evaluation (Signed)
Occupational Therapy Evaluation Patient Details Name: Angela Morales MRN: [id number] DOB: 06-18-1946 Today's Date: 03/14/2021   History of Present Illness 75 y.o. female with medical history significant of A. fib status post recent ablation on 8/3, SVT, severe lumbar scoliosis (not ambulatory surgery), celiac disease.  She was recently admitted for GBS and hospital course complicated by respiratory failure secondary to aspiration event which required intubation and tracheostomy placement.  Hospital course also complicated by severe hyponatremia, A. fib with RVR.  She was discharged to inpatient rehab on 9/7.  Readmit to acute for  persistent tachycardia with heart rate in the 110s but remained asymptomatic.   Clinical Impression   Patient readmitted to [facility] from CIR for the diagnosis above.  Deficits impacting independence are listed below.  Currently she is needing up to Mod a +2 for slide board transfers, and up to Max A at bed level for lower body ADL and hygiene s/p BM.  Patient remains very motivated to return to CIR, and is appropriate for intensive rehab post acute to regain function and return home at max functional potential.  OT to follow in the acute setting.         Recommendations for follow up therapy are one component of a multi-disciplinary discharge planning process, led by the attending physician.  Recommendations may be updated based on patient status, additional functional criteria and insurance authorization.   Follow Up Recommendations  CIR;Supervision/Assistance - 24 hour    Equipment Recommendations       Recommendations for Other Services Rehab consult     Precautions / Restrictions Precautions Precautions: Fall Required Braces or Orthoses: Other Brace Other Brace: bilateral ankle air splints. Restrictions Weight Bearing Restrictions: No      Mobility Bed Mobility Overal bed mobility: Needs Assistance Bed Mobility: Rolling;Sidelying to Sit Rolling: Min  assist Sidelying to sit: Mod assist;+2 for safety/equipment            Transfers Overall transfer level: Needs assistance Equipment used: Sliding board Transfers: Lateral/Scoot Transfers          Lateral/Scoot Transfers: +2 physical assistance;+2 safety/equipment;Mod assist      Balance Overall balance assessment: Needs assistance Sitting-balance support: Feet supported Sitting balance-Leahy Scale: Fair                                     ADL either performed or assessed with clinical judgement   ADL Overall ADL's : Needs assistance/impaired Eating/Feeding: Set up;With adaptive utensils   Grooming: Set up;Sitting   Upper Body Bathing: Bed level;Minimal assistance   Lower Body Bathing: Maximal assistance;Bed level   Upper Body Dressing : Minimal assistance;Bed level   Lower Body Dressing: Maximal assistance;Bed level   Toilet Transfer: Moderate assistance;+2 for physical assistance;Transfer board   Toileting- Clothing Manipulation and Hygiene: Total assistance;Bed level       Functional mobility during ADLs: +2 for physical assistance;Moderate assistance       Vision Baseline Vision/History: 1 Wears glasses Patient Visual Report: No change from baseline       Perception  WFL   Praxis  Intact    Pertinent Vitals/Pain Pain Assessment: No/denies pain Pain Intervention(s): Monitored during session     Hand Dominance Left   Extremity/Trunk Assessment Upper Extremity Assessment RUE Deficits / Details: 3/5 grossly strength throughout. Poor pinch strength with numbness and tingling RUE Sensation: decreased light touch;decreased proprioception RUE Coordination: decreased fine motor;decreased gross motor  LUE Deficits / Details: 3/5 strength throughout. Poor pinch strength and numbness and tingling LUE Sensation: decreased light touch;decreased proprioception LUE Coordination: decreased fine motor;decreased gross motor   Lower Extremity  Assessment Lower Extremity Assessment: Defer to PT evaluation   Cervical / Trunk Assessment Cervical / Trunk Assessment: Other exceptions Cervical / Trunk Exceptions: H/o scoliosis and arthritis   Communication Communication Communication: No difficulties   Cognition Arousal/Alertness: Awake/alert Behavior During Therapy: WFL for tasks assessed/performed Overall Cognitive Status: Within Functional Limits for tasks assessed                                                      Home Living Family/patient expects to be discharged to:: Inpatient rehab Living Arrangements: Spouse/significant other;Children Available Help at Discharge: Family;Available PRN/intermittently Type of Home: House Home Access: Stairs to enter [employer] of Steps: 3-4 Entrance Stairs-Rails: Left Home Layout: Multi-level;Able to live on main level with bedroom/bathroom;Full bath on main level     Bathroom Shower/Tub: Tub/shower unit;Walk-in shower         Home Equipment: None   Additional Comments: Lives with husband and son; husband works  Lives With: Spouse    Prior Functioning/Environment Level of Independence: Independent        Comments: Per chart: Independent without DME; retired [employer]; drives; enjoys babysitting grandkids (54 and 64 y.o.), enjoys time with cat and dog, watching tv        OT Problem List: Decreased strength;Decreased activity tolerance;Impaired balance (sitting and/or standing);Decreased coordination;Decreased range of motion;Decreased cognition;Decreased safety awareness;Decreased knowledge of use of DME or AE;Decreased knowledge of precautions;Cardiopulmonary status limiting activity;Impaired sensation;Impaired tone;Impaired UE functional use      OT Treatment/Interventions: Self-care/ADL training;Therapeutic exercise;DME and/or AE instruction;Therapeutic activities;Cognitive remediation/compensation;Patient/family  education;Balance training;Energy conservation;Neuromuscular education    OT Goals(Current goals can be found in the care plan section) Acute Rehab OT Goals Patient Stated Goal: Continue with rehab OT Goal Formulation: With patient Time For Goal Achievement: 03/28/21 Potential to Achieve Goals: Good ADL Goals Pt Will Perform Grooming: with set-up;sitting Pt Will Perform Upper Body Bathing: with set-up;sitting Pt Will Perform Upper Body Dressing: with set-up;sitting Pt Will Transfer to Toilet: with mod assist;with transfer board Pt/caregiver will Perform Home Exercise Program: Increased strength;Both right and left upper extremity;With theraband;With Supervision;With written HEP provided  OT Frequency: Min 2X/week   Barriers to D/C:  None          Co-evaluation PT/OT/SLP Co-Evaluation/Treatment: Yes Reason for Co-Treatment: Complexity of the patient's impairments (multi-system involvement);For patient/therapist safety;To address functional/ADL transfers   OT goals addressed during session: ADL's and self-care;Strengthening/ROM      AM-PAC OT "6 Clicks" Daily Activity     Outcome Measure Help from another person eating meals?: A Little Help from another person taking care of personal grooming?: A Little Help from another person toileting, which includes using toliet, bedpan, or urinal?: A Lot Help from another person bathing (including washing, rinsing, drying)?: A Lot Help from another person to put on and taking off regular upper body clothing?: A Little Help from another person to put on and taking off regular lower body clothing?: A Lot 6 Click Score: 15   End of Session Equipment Utilized During Treatment: Gait belt Nurse Communication: Mobility status  Activity Tolerance: Patient tolerated treatment well Patient left: in chair;with call bell/phone within reach  OT Visit Diagnosis: Other abnormalities of gait and mobility (R26.89);Muscle weakness (generalized)  (M62.81);Other symptoms and signs involving cognitive function;Other symptoms and signs involving the nervous system (R29.898)                Time: 0388-8280 OT Time Calculation (min): 33 min Charges:  OT General Charges $OT Visit: 1 Visit OT Evaluation $OT Eval Moderate Complexity: 1 Mod  03/14/2021  RP, OTR/L  Acute Rehabilitation Services  Office:  [phone]   Metta Clines 03/14/2021, 1:55 PM

## 2021-03-14 NOTE — Progress Notes (Signed)
Progress Note  Patient Name: Angela Morales Date of Encounter: 03/14/2021  Primary Cardiologist: Sanda Klein, MD   Subjective   Denies chest pain or sob.   Inpatient Medications    Scheduled Meds:  apixaban  5 mg Oral BID   bethanechol  20 mg Oral TID   cyclobenzaprine  5 mg Oral BID   feeding supplement  1 Container Oral BID BM   gabapentin  400 mg Oral QHS   latanoprost  1 drop Left Eye QHS   pantoprazole  40 mg Oral Daily   polyethylene glycol  17 g Oral Daily   potassium chloride  60 mEq Oral Daily   timolol  1 drop Left Eye BID   Continuous Infusions:  amiodarone 30 mg/hr (03/14/21 0207)   PRN Meds: acetaminophen, oxyCODONE, polyvinyl alcohol, senna-docusate, traZODone   Vital Signs    Vitals:   03/13/21 2350 03/14/21 0000 03/14/21 0400 03/14/21 0800  BP: 98/66 106/65 93/67 99/68   Pulse: 95 90 90 (!) 106  Resp: 18 19 (!) 24 20  Temp: (!) 97.4 F (36.3 C)  97.8 F (36.6 C)   TempSrc: Oral  Oral   SpO2: 97% 97% 98% 99%  Weight:      Height:        Intake/Output Summary (Last 24 hours) at 03/14/2021 0854 Last data filed at 03/14/2021 1497 Gross per 24 hour  Intake 1631.38 ml  Output 1700 ml  Net -68.62 ml   Filed Weights   03/13/21 2224  Weight: 64 kg    Telemetry    Atrial flutter with CVR/RVR - Personally Reviewed  ECG    none - Personally Reviewed  Physical Exam   GEN: No acute distress.   Neck: No JVD Cardiac: IReg tachy, no murmurs, rubs, or gallops.  Respiratory: Clear to auscultation bilaterally. GI: Soft, nontender, non-distended  MS: No edema; No deformity. Neuro:  Nonfocal  Psych: Normal affect   Labs    Chemistry Recent Labs  Lab 03/09/21 0543 03/13/21 1220 03/14/21 0131  NA 131* 134* 134*  K 3.8 4.3 3.5  CL 97* 101 100  CO2 26 26 25   GLUCOSE 90 99 94  BUN 10 5* 5*  CREATININE 0.41* 0.45 0.41*  CALCIUM 8.6* 8.3* 8.4*  GFRNONAA >60 >60 >60  ANIONGAP 8 7 9      Hematology Recent Labs  Lab  03/09/21 0543 03/13/21 1220 03/14/21 0131  WBC 5.6 5.7 6.4  RBC 3.71* 3.68* 3.88  HGB 12.0 12.0 12.3  HCT 36.3 36.1 38.2  MCV 97.8 98.1 98.5  MCH 32.3 32.6 31.7  MCHC 33.1 33.2 32.2  RDW 14.9 14.7 14.8  PLT 281 201 210    Cardiac EnzymesNo results for input(s): TROPONINI in the last 168 hours. No results for input(s): TROPIPOC in the last 168 hours.   BNPNo results for input(s): BNP, PROBNP in the last 168 hours.   DDimer No results for input(s): DDIMER in the last 168 hours.   Radiology    No results found.  Cardiac Studies   none  Patient Profile     75 y.o. female admitted with atrial flutter and GBS treated with IV amio and IVIG. She has been DCCV and had recurrent flutter  Assessment & Plan    Atrial fib/flutter - she remains in flutter with a RVR/CVR and will continue IV amiodarone with plans for DCCV on Monday morning. Continue current meds. Coags - no bleeding on Eliquis.      For questions or  updates, please contact [place] Please consult [email or web address] for contact info under Cardiology/STEMI.      Signed, Cristopher Peru, MD  03/14/2021, 8:54 AM

## 2021-03-15 DIAGNOSIS — I4892 Unspecified atrial flutter: Secondary | ICD-10-CM | POA: Diagnosis not present

## 2021-03-15 NOTE — Progress Notes (Signed)
Progress Note  Patient Name: Angela Morales Date of Encounter: 03/15/2021  Primary Cardiologist: Sanda Klein, MD   Subjective   No chest pain or sob. Leg weakness persists.  Inpatient Medications    Scheduled Meds:  apixaban  5 mg Oral BID   bethanechol  20 mg Oral TID   cyclobenzaprine  5 mg Oral BID   feeding supplement  1 Container Oral BID BM   gabapentin  400 mg Oral QHS   latanoprost  1 drop Left Eye QHS   pantoprazole  40 mg Oral Daily   polyethylene glycol  17 g Oral Daily   potassium chloride  60 mEq Oral Daily   timolol  1 drop Left Eye BID   Continuous Infusions:  amiodarone 30 mg/hr (03/14/21 2242)   PRN Meds: acetaminophen, oxyCODONE, polyvinyl alcohol, senna-docusate, traZODone   Vital Signs    Vitals:   03/14/21 2027 03/15/21 0013 03/15/21 0414 03/15/21 0800  BP: 98/63 118/68 112/69 108/60  Pulse: 100 93 96 97  Resp: 17 18 15 15   Temp: 97.9 F (36.6 C) 98.3 F (36.8 C) 97.6 F (36.4 C)   TempSrc: Oral Oral Oral   SpO2: 98% 97% 98% 94%  Weight:      Height:        Intake/Output Summary (Last 24 hours) at 03/15/2021 1040 Last data filed at 03/15/2021 0453 Gross per 24 hour  Intake 611.92 ml  Output 200 ml  Net 411.92 ml   Filed Weights   03/13/21 2224  Weight: 64 kg    Telemetry    Atrial flutter with a RVR - Personally Reviewed  ECG    none - Personally Reviewed  Physical Exam   GEN: No acute distress.   Neck: No JVD Cardiac: RRR, no murmurs, rubs, or gallops.  Respiratory: Clear to auscultation bilaterally. GI: Soft, nontender, non-distended  MS: No edema; No deformity. Neuro:  Nonfocal  Psych: Normal affect   Labs    Chemistry Recent Labs  Lab 03/09/21 0543 03/13/21 1220 03/14/21 0131  NA 131* 134* 134*  K 3.8 4.3 3.5  CL 97* 101 100  CO2 26 26 25   GLUCOSE 90 99 94  BUN 10 5* 5*  CREATININE 0.41* 0.45 0.41*  CALCIUM 8.6* 8.3* 8.4*  GFRNONAA >60 >60 >60  ANIONGAP 8 7 9      Hematology Recent Labs   Lab 03/09/21 0543 03/13/21 1220 03/14/21 0131  WBC 5.6 5.7 6.4  RBC 3.71* 3.68* 3.88  HGB 12.0 12.0 12.3  HCT 36.3 36.1 38.2  MCV 97.8 98.1 98.5  MCH 32.3 32.6 31.7  MCHC 33.1 33.2 32.2  RDW 14.9 14.7 14.8  PLT 281 201 210    Cardiac EnzymesNo results for input(s): TROPONINI in the last 168 hours. No results for input(s): TROPIPOC in the last 168 hours.   BNPNo results for input(s): BNP, PROBNP in the last 168 hours.   DDimer No results for input(s): DDIMER in the last 168 hours.   Radiology    No results found.  Cardiac Studies   none  Patient Profile     75 y.o. female admitted with atrial flutter and GBS on IV amio and IVIG with recurrent atrial flutter after DCCV  Assessment & Plan    Atrial fib/flutter - she apears to have atypical flutter and has been on IV amiodarone. She will undergo DCCV tomorrow. Coags - she remains on eliquis with no bleeding.      For questions or updates, please contact  [facility] Please consult [email or web address] for contact info under Cardiology/STEMI.      Signed, Cristopher Peru, MD  03/15/2021, 10:40 AM

## 2021-03-15 NOTE — Progress Notes (Signed)
Patient has already ready 5 doses of ELIQUIS.

## 2021-03-15 NOTE — Progress Notes (Signed)
Inpatient Rehab Admissions Coordinator Note:   Per PT/OT patient was screened for CIR candidacy by Keerat Denicola Danford Bad, CCC-SLP. At this time, pt appears to be a potential candidate for CIR. I will place an order for rehab consult for full assessment, per our protocol.  Please contact me any with questions.Gayland Curry, [place], [place] Admissions Coordinator [phone] 03/15/21 6:20 PM

## 2021-03-15 NOTE — Progress Notes (Signed)
PROGRESS NOTE    Angela Morales   WER:[id number]  DOB: 06-Aug-1945  DOA: 03/13/2021 PCP: Deland Pretty, MD   Brief Narrative:  Angela Morales 75 year old female with atrial fibrillation status post ablation, SVT, lumbar scoliosis, celiac disease who recently was hospitalized for Guillain-Barr syndrome.  Hospitalization was complicated neuromuscular respiratory failure requiring intubation, septic shock, staph aureus pneumonia (right lower lobe), tracheostomy, hyponatremia and A. fib with RVR.  The patient was subsequently discharged to [facility] inpatient rehab be treated for severe deconditioning. While in CIR, the patient developed persistent tachycardia and EKG revealed atrial flutter.  [facility] was contacted and patient was readmitted to [facility].  She was started on an Amiodarone infusion and given 1 L fluid bolus for hypotension. Cardiology was consulted.  Subjective: No new complaints today. She was able to get up to the chair yesterday.     Assessment & Plan:   Principal Problem:   Atrial flutter with rapid ventricular response (paroxysmal) -Ablated 8/3 -He has been receiving 200 mg of amiodarone daily while at CIR --Continue amiodarone infusion per cardiology/electrophysiology - Continue Eliquis - plan for cardioversion on Monday  Active Problems:   GBS (Guillain Barre syndrome)  Chronic pain and degenerative disc disease -Given 2 rounds of IVIG by neurology during prior hospital admission -Per notes, Guillain-Barr was idiopathic versus postprocedural related to cardiac ablation -She was decannulated on 9/6 and cor track removed -Continue to work with PT as able with plan to return to CIR once stable -Can continue to use Neurontin, Cymbalta and low-dose oxycodone as needed to control pain - Continue Flexeril twice daily to control spasticity    Hyponatremia-SIADH - sodium 131> 134 - She received IV fluids (normal saline) initially - I dc'd fluids  on 9/16  - continue to fluid restrict and follow sodium intermittently    Urinary retention -She receives bethanechol and Flomax but states that she still, intermittently, has required in and out catheterizations due to inability to urinate - Continue bethanechol and Flomax   Insomnia - Started trial of trazodone- working well  Celiac disease - Gluten-free diet ordered  Glaucoma - Continue timolol   Time spent in minutes: 30 DVT prophylaxis:  apixaban (ELIQUIS) tablet 5 mg  Code Status: Full code Family Communication:  Level of Care: Level of care: Progressive Disposition Plan:  Status is: Inpatient  Remains inpatient appropriate because:IV treatments appropriate due to intensity of illness or inability to take PO and Inpatient level of care appropriate due to severity of illness  Dispo: The patient is from: Home              Anticipated d/c is to: CIR              Patient currently is not medically stable to d/c.   Difficult to place patient No  Consultants:  EP Procedures:  none Antimicrobials:  Anti-infectives (From admission, onward)    None        Objective: Vitals:   03/15/21 0013 03/15/21 0414 03/15/21 0800 03/15/21 1045  BP: 118/68 112/69 108/60 113/62  Pulse: 93 96 97   Resp: 18 15 15 13   Temp: 98.3 F (36.8 C) 97.6 F (36.4 C)    TempSrc: Oral Oral    SpO2: 97% 98% 94% 100%  Weight:      Height:        Intake/Output Summary (Last 24 hours) at 03/15/2021 1131 Last data filed at 03/15/2021 0453 Gross per 24 hour  Intake 611.92 ml  Output 200 ml  Net 411.92 ml    Filed Weights   03/13/21 2224  Weight: 64 kg    Examination: General exam: Appears comfortable  HEENT: PERRLA, oral mucosa moist, no sclera icterus or thrush Respiratory system: Clear to auscultation. Respiratory effort normal. Cardiovascular system: S1 & S2 heard, regular rate and rhythm Gastrointestinal system: Abdomen soft, non-tender, nondistended. Normal bowel sounds    Central nervous system: Alert and oriented. 4/5 strength in LE and 5/5 in UE Extremities: No cyanosis, clubbing or edema Skin: No rashes or ulcers Psychiatry:  Mood & affect appropriate.       Data Reviewed: I have personally reviewed following labs and imaging studies  CBC: Recent Labs  Lab 03/09/21 0543 03/13/21 1220 03/14/21 0131  WBC 5.6 5.7 6.4  HGB 12.0 12.0 12.3  HCT 36.3 36.1 38.2  MCV 97.8 98.1 98.5  PLT 281 201 413    Basic Metabolic Panel: Recent Labs  Lab 03/09/21 0543 03/13/21 0907 03/13/21 1220 03/14/21 0131  NA 131*  --  134* 134*  K 3.8  --  4.3 3.5  CL 97*  --  101 100  CO2 26  --  26 25  GLUCOSE 90  --  99 94  BUN 10  --  5* 5*  CREATININE 0.41*  --  0.45 0.41*  CALCIUM 8.6*  --  8.3* 8.4*  MG  --  2.0  --   --     GFR: Estimated Creatinine Clearance: 52.5 mL/min (A) (by C-G formula based on SCr of 0.41 mg/dL (L)). Liver Function Tests: No results for input(s): AST, ALT, ALKPHOS, BILITOT, PROT, ALBUMIN in the last 168 hours. No results for input(s): LIPASE, AMYLASE in the last 168 hours. No results for input(s): AMMONIA in the last 168 hours. Coagulation Profile: No results for input(s): INR, PROTIME in the last 168 hours. Cardiac Enzymes: No results for input(s): CKTOTAL, CKMB, CKMBINDEX, TROPONINI in the last 168 hours. BNP (last 3 results) No results for input(s): PROBNP in the last 8760 hours. HbA1C: No results for input(s): HGBA1C in the last 72 hours. CBG: No results for input(s): GLUCAP in the last 168 hours.  Lipid Profile: No results for input(s): CHOL, HDL, LDLCALC, TRIG, CHOLHDL, LDLDIRECT in the last 72 hours. Thyroid Function Tests: No results for input(s): TSH, T4TOTAL, FREET4, T3FREE, THYROIDAB in the last 72 hours. Anemia Panel: No results for input(s): VITAMINB12, FOLATE, FERRITIN, TIBC, IRON, RETICCTPCT in the last 72 hours. Urine analysis:    Component Value Date/Time   COLORURINE YELLOW 02/06/2021 1107    APPEARANCEUR CLEAR 02/06/2021 1107   LABSPEC 1.043 (H) 02/06/2021 1107   PHURINE 6.0 02/06/2021 1107   GLUCOSEU 50 (A) 02/06/2021 1107   HGBUR NEGATIVE 02/06/2021 1107   BILIRUBINUR NEGATIVE 02/06/2021 1107   KETONESUR 20 (A) 02/06/2021 1107   PROTEINUR NEGATIVE 02/06/2021 1107   UROBILINOGEN 0.2 12/01/2013 1339   NITRITE NEGATIVE 02/06/2021 1107   LEUKOCYTESUR NEGATIVE 02/06/2021 1107   Sepsis Labs: @LABRCNTIP (procalcitonin:4,lacticidven:4) )No results found for this or any previous visit (from the past 240 hour(s)).       Radiology Studies: No results found.    Scheduled Meds:  apixaban  5 mg Oral BID   bethanechol  20 mg Oral TID   cyclobenzaprine  5 mg Oral BID   feeding supplement  1 Container Oral BID BM   gabapentin  400 mg Oral QHS   latanoprost  1 drop Left Eye QHS   pantoprazole  40 mg Oral Daily  polyethylene glycol  17 g Oral Daily   potassium chloride  60 mEq Oral Daily   timolol  1 drop Left Eye BID   Continuous Infusions:  amiodarone 30 mg/hr (03/15/21 1051)     LOS: 2 days      Debbe Odea, MD [facility] Pager: [email or web address] 03/15/2021, 11:31 AM

## 2021-03-15 NOTE — Plan of Care (Signed)
  Problem: Clinical Measurements: Goal: Will remain free from infection Outcome: Progressing Goal: Respiratory complications will improve Outcome: Progressing Goal: Cardiovascular complication will be avoided Outcome: Progressing

## 2021-03-16 ENCOUNTER — Encounter (HOSPITAL_COMMUNITY)
Admission: AD | Disposition: A | Discharge status: Rehabilitation Facility | Payer: Self-pay | Attending: Internal Medicine

## 2021-03-16 ENCOUNTER — Inpatient Hospital Stay (HOSPITAL_COMMUNITY): Payer: Medicare Other | Admitting: Anesthesiology

## 2021-03-16 DIAGNOSIS — I4892 Unspecified atrial flutter: Secondary | ICD-10-CM | POA: Diagnosis not present

## 2021-03-16 HISTORY — PX: CARDIOVERSION: SHX1299

## 2021-03-16 LAB — BASIC METABOLIC PANEL
Anion gap: 8 (ref 5–15)
BUN: 5 mg/dL — ABNORMAL LOW (ref 8–23)
CO2: 26 mmol/L (ref 22–32)
Calcium: 8.1 mg/dL — ABNORMAL LOW (ref 8.9–10.3)
Chloride: 97 mmol/L — ABNORMAL LOW (ref 98–111)
Creatinine, Ser: 0.46 mg/dL (ref 0.44–1.00)
GFR, Estimated: >60 mL/min (ref >60)
Glucose, Bld: 90 mg/dL (ref 70–99)
Potassium: 3.8 mmol/L (ref 3.5–5.1)
Sodium: 131 mmol/L — ABNORMAL LOW (ref 135–145)

## 2021-03-16 SURGERY — CARDIOVERSION
Anesthesia: General

## 2021-03-16 MED ORDER — PROPOFOL 10 MG/ML IV BOLUS
INTRAVENOUS | Status: DC | PRN
  Administered 2021-03-16: 70 mg via INTRAVENOUS

## 2021-03-16 MED ORDER — LACTATED RINGERS IV SOLN: INTRAVENOUS | Status: DC | PRN

## 2021-03-16 MED ORDER — AMIODARONE HCL 200 MG PO TABS
400.0000 mg | ORAL_TABLET | Freq: Every day | ORAL | Status: DC
  Administered 2021-03-16 – 2021-03-20 (×5): 400 mg via ORAL
  Filled 2021-03-16 (×5): qty 2

## 2021-03-16 MED ORDER — PHENYLEPHRINE HCL (PRESSORS) 10 MG/ML IV SOLN
INTRAVENOUS | Status: DC | PRN
  Administered 2021-03-16 (×2): 160 ug via INTRAVENOUS

## 2021-03-16 MED ORDER — LIDOCAINE HCL (CARDIAC) PF 100 MG/5ML IV SOSY
PREFILLED_SYRINGE | INTRAVENOUS | Status: DC | PRN
Start: 2021-03-16 — End: 2021-03-16
  Administered 2021-03-16: 60 mg via INTRAVENOUS

## 2021-03-16 NOTE — Progress Notes (Signed)
Patient returned from cardioversion. Vital signs taken and stable. Patient in normal sinus rhythm.  [place] Danne Scardina, RN

## 2021-03-16 NOTE — Anesthesia Postprocedure Evaluation (Signed)
Anesthesia Post Note  Patient: Angela Morales  Procedure(s) Performed: CARDIOVERSION     Patient location during evaluation: Endoscopy Anesthesia Type: General Level of consciousness: awake Pain management: pain level controlled Vital Signs Assessment: post-procedure vital signs reviewed and stable Respiratory status: spontaneous breathing, nonlabored ventilation, respiratory function stable and patient connected to nasal cannula oxygen Cardiovascular status: blood pressure returned to baseline and stable Postop Assessment: no apparent nausea or vomiting Anesthetic complications: no   No notable events documented.  Last Vitals:  Vitals:   03/16/21 1557 03/16/21 2005  BP: 115/62 121/68  Pulse: 86 89  Resp: 16 20  Temp: 36.7 C 36.7 C  SpO2: 97% 100%    Last Pain:  Vitals:   03/16/21 2005  TempSrc: Oral  PainSc:                  [name]

## 2021-03-16 NOTE — Progress Notes (Signed)
Physical Therapy Treatment Patient Details Name: Angela Morales MRN: [id number] DOB: 1945/11/23 Today's Date: 03/16/2021   History of Present Illness The pt is a 75 y.o. female readmitted from [facility] on 9/16 for persistent tachycardia with heart rate in the 110s but remained asymptomatic. Of note, pt d/c to CIR on 9/7 after admission from 8/11-9/7 due to GBS and hospital course complicated by respiratory failure secondary to aspiration event which required intubation and tracheostomy placement.  Hospital course also complicated by severe hyponatremia, A. fib with RVR. PMH includes: A. fib status post recent ablation on 8/3, SVT, severe lumbar scoliosis (not ambulatory surgery), celiac disease.    PT Comments    The pt was able to demo good progress with attempts to assist with bed mobility and demos good improvement in seated balance. The pt was able to complete slideboard transfer with minA of 2 to manage positioning of her feet/ankles as well as for balance and movement of hips/trunk. The pt was then educated in a series of exercises focused on improved strength and ROM of BLE, especially ankle eversion. Given continued deficits in functional strength, muscular endurance, and stability, continue to recommend return to CIR.    Recommendations for follow up therapy are one component of a multi-disciplinary discharge planning process, led by the attending physician.  Recommendations may be updated based on patient status, additional functional criteria and insurance authorization.  Follow Up Recommendations  CIR     Equipment Recommendations  None recommended by PT    Recommendations for Other Services       Precautions / Restrictions Precautions Precautions: Fall Precaution Comments: bladder/bowel incontinence Required Braces or Orthoses: Other Brace Other Brace: bilateral ankle air splints. Restrictions Weight Bearing Restrictions: No     Mobility  Bed Mobility Overal bed  mobility: Needs Assistance Bed Mobility: Rolling;Sidelying to Sit Rolling: Min assist Sidelying to sit: Min assist;+2 for safety/equipment       General bed mobility comments: Min A to roll to R side, close to Mod A to roll to L side for peri care and changing of bed pads. MIn A x 2 for safety in complete log rolling to get EOB. Assist to advance LEs off of bed (improving initiation), cues for hand placement and pushing up from bed    Transfers Overall transfer level: Needs assistance Equipment used: Sliding board Transfers: Lateral/Scoot Transfers          Lateral/Scoot Transfers: Min assist;+2 safety/equipment;With slide board General transfer comment: Min A x 2 for safety and guarding for sliding board, assist to position B feet/ankles properly to prevent injury, cues for sequencing task effectively with good carryover  Ambulation/Gait             General Gait Details: pt unable    Balance Overall balance assessment: Needs assistance Sitting-balance support: Feet supported Sitting balance-Leahy Scale: Fair Sitting balance - Comments: Able to maintain static sitting EOB with and without UE support. min guard for leaning to side for sliding board placement                                    Cognition Arousal/Alertness: Awake/alert Behavior During Therapy: WFL for tasks assessed/performed Overall Cognitive Status: Within Functional Limits for tasks assessed  General Comments: pt able to follow all commands and demo good safety awareness and recall of techniques      Exercises General Exercises - Lower Extremity Ankle Circles/Pumps: AROM;Both;10 reps;Seated (focus on DF and eversion 2 x 5) Long Arc Quad: AROM;Both;10 reps;Seated Heel Slides: AROM;Both;10 reps;Seated    General Comments General comments (skin integrity, edema, etc.): VSS on RA      Pertinent Vitals/Pain Pain Assessment: No/denies  pain Pain Intervention(s): Monitored during session;Repositioned     PT Goals (current goals can now be found in the care plan section) Acute Rehab PT Goals Patient Stated Goal: Continue with rehab PT Goal Formulation: With patient/family Time For Goal Achievement: 03/28/21 Potential to Achieve Goals: Good Progress towards PT goals: Progressing toward goals    Frequency    Min 4X/week      PT Plan Current plan remains appropriate    Co-evaluation PT/OT/SLP Co-Evaluation/Treatment: Yes Reason for Co-Treatment: Necessary to address cognition/behavior during functional activity;For patient/therapist safety;To address functional/ADL transfers PT goals addressed during session: Mobility/safety with mobility;Balance;Proper use of DME;Strengthening/ROM OT goals addressed during session: ADL's and self-care      AM-PAC PT "6 Clicks" Mobility   Outcome Measure  Help needed turning from your back to your side while in a flat bed without using bedrails?: A Lot Help needed moving from lying on your back to sitting on the side of a flat bed without using bedrails?: A Lot Help needed moving to and from a bed to a chair (including a wheelchair)?: Total Help needed standing up from a chair using your arms (e.g., wheelchair or bedside chair)?: A Lot Help needed to walk in hospital room?: Total Help needed climbing 3-5 steps with a railing? : Total 6 Click Score: 9    End of Session Equipment Utilized During Treatment: Gait belt Activity Tolerance: Patient tolerated treatment well;Patient limited by fatigue Patient left: in chair;with call bell/phone within reach;with nursing/sitter in room Nurse Communication: Mobility status PT Visit Diagnosis: Other abnormalities of gait and mobility (R26.89);Muscle weakness (generalized) (M62.81);Other symptoms and signs involving the nervous system (R29.898) Hemiplegia - caused by: Unspecified     Time: 9390-3009 PT Time Calculation (min)  (ACUTE ONLY): 46 min  Charges:  $Therapeutic Exercise: 8-22 mins $Therapeutic Activity: 8-22 mins                     West Carbo, PT, DPT   Acute Rehabilitation Department Pager #: [phone]   Sandra Cockayne 03/16/2021, 2:33 PM

## 2021-03-16 NOTE — Interval H&P Note (Signed)
History and Physical Interval Note:  03/16/2021 8:56 AM  Angela Morales  has presented today for surgery, with the diagnosis of afib.  The various methods of treatment have been discussed with the patient and family. After consideration of risks, benefits and other options for treatment, the patient has consented to  Procedure(s): CARDIOVERSION (N/A) as a surgical intervention.  The patient's history has been reviewed, patient examined, no change in status, stable for surgery.  I have reviewed the patient's chart and labs.  Questions were answered to the patient's satisfaction.     Mertie Moores

## 2021-03-16 NOTE — Progress Notes (Signed)
Physical Therapy Treatment Patient Details Name: Angela Morales MRN: [id number] DOB: 03/13/1946 Today's Date: 03/16/2021   History of Present Illness The pt is a 75 y.o. female readmitted from [facility] on 9/16 for persistent tachycardia with heart rate in the 110s but remained asymptomatic. Of note, pt d/c to CIR on 9/7 after admission from 8/11-9/7 due to GBS and hospital course complicated by respiratory failure secondary to aspiration event which required intubation and tracheostomy placement.  Hospital course also complicated by severe hyponatremia, A. fib with RVR. PMH includes: A. fib status post recent ablation on 8/3, SVT, severe lumbar scoliosis (not ambulatory surgery), celiac disease.    PT Comments    PT asked to return to assist pt back to bed this afternoon. She was able to complete lateral scoot along slide board back to bed, but continues to need assist to position bilateral feet as well as to scoot hips. The pt continues to demo improvement in ability to utilize BUE to complete scooting, and improvements in seated balance during transfers, but continues to have difficulty controlling and maintaining safe position of ankles and feet during weight bearing. Will continue to benefit from CIR level therapies when medically stable to return.     Recommendations for follow up therapy are one component of a multi-disciplinary discharge planning process, led by the attending physician.  Recommendations may be updated based on patient status, additional functional criteria and insurance authorization.  Follow Up Recommendations  CIR     Equipment Recommendations  None recommended by PT    Recommendations for Other Services       Precautions / Restrictions Precautions Precautions: Fall Precaution Comments: bladder/bowel incontinence Required Braces or Orthoses: Other Brace Other Brace: bilateral ankle air splints. Restrictions Weight Bearing Restrictions: No     Mobility  Bed  Mobility Overal bed mobility: Needs Assistance Bed Mobility: Rolling;Sidelying to Sit Rolling: Min assist Sidelying to sit: Mod assist;+2 for safety/equipment       General bed mobility comments: minA to assist with rolling, pt needing more assist with BLE than UE. modA to bring LE into bed    Transfers Overall transfer level: Needs assistance Equipment used: Sliding board Transfers: Lateral/Scoot Transfers          Lateral/Scoot Transfers: Min assist;+2 safety/equipment;With slide board General transfer comment: minA of 2 to complete transfer from [facility] to bed, pt needing assist to maintain position of feet/ankle in neutral as well as to complete scooting with bed pad on slide board.  Ambulation/Gait             General Gait Details: pt unable       Balance Overall balance assessment: Needs assistance Sitting-balance support: Feet supported Sitting balance-Leahy Scale: Fair Sitting balance - Comments: Able to maintain static sitting EOB with and without UE support. min guard for leaning to side for sliding board placement                                    Cognition Arousal/Alertness: Awake/alert Behavior During Therapy: WFL for tasks assessed/performed Overall Cognitive Status: Within Functional Limits for tasks assessed                                 General Comments: pt able to follow all commands and demo good safety awareness and recall of techniques  Exercises General Exercises - Lower Extremity Ankle Circles/Pumps: AROM;Both;10 reps;Seated (focus on DF and eversion 2 x 5) Long Arc Quad: AROM;Both;10 reps;Seated Heel Slides: AROM;Both;10 reps;Seated    General Comments General comments (skin integrity, edema, etc.): VSS on RA      Pertinent Vitals/Pain Pain Assessment: No/denies pain Pain Intervention(s): Monitored during session     PT Goals (current goals can now be found in the care plan section) Acute Rehab PT  Goals Patient Stated Goal: Continue with rehab PT Goal Formulation: With patient/family Time For Goal Achievement: 03/28/21 Potential to Achieve Goals: Good Progress towards PT goals: Progressing toward goals    Frequency    Min 4X/week      PT Plan Current plan remains appropriate    Co-evaluation PT/OT/SLP Co-Evaluation/Treatment: Yes Reason for Co-Treatment: For patient/therapist safety;To address functional/ADL transfers PT goals addressed during session: Mobility/safety with mobility;Balance OT goals addressed during session: ADL's and self-care      AM-PAC PT "6 Clicks" Mobility   Outcome Measure  Help needed turning from your back to your side while in a flat bed without using bedrails?: A Little Help needed moving from lying on your back to sitting on the side of a flat bed without using bedrails?: A Lot Help needed moving to and from a bed to a chair (including a wheelchair)?: Total Help needed standing up from a chair using your arms (e.g., wheelchair or bedside chair)?: A Lot Help needed to walk in hospital room?: Total Help needed climbing 3-5 steps with a railing? : Total 6 Click Score: 10    End of Session Equipment Utilized During Treatment: Gait belt Activity Tolerance: Patient tolerated treatment well;Patient limited by fatigue Patient left: with call bell/phone within reach;in bed Nurse Communication: Mobility status PT Visit Diagnosis: Other abnormalities of gait and mobility (R26.89);Muscle weakness (generalized) (M62.81);Other symptoms and signs involving the nervous system (R29.898) Hemiplegia - caused by: Unspecified     Time: 1840-3754 PT Time Calculation (min) (ACUTE ONLY): 10 min  Charges:  $Therapeutic Exercise: 8-22 mins $Therapeutic Activity: 8-22 mins                     West Carbo, PT, DPT   Acute Rehabilitation Department Pager #: [phone]   Angela Morales 03/16/2021, 3:59 PM

## 2021-03-16 NOTE — Progress Notes (Signed)
Occupational Therapy Treatment Patient Details Name: Angela Morales MRN: [id number] DOB: 1946-06-25 Today's Date: 03/16/2021   History of present illness The pt is a 75 y.o. female readmitted from [facility] on 9/16 for persistent tachycardia with heart rate in the 110s but remained asymptomatic. Of note, pt d/c to CIR on 9/7 after admission from 8/11-9/7 due to GBS and hospital course complicated by respiratory failure secondary to aspiration event which required intubation and tracheostomy placement.  Hospital course also complicated by severe hyponatremia, A. fib with RVR. PMH includes: A. fib status post recent ablation on 8/3, SVT, severe lumbar scoliosis (not ambulatory surgery), celiac disease.   OT comments  Pt progressing well towards OT goals and remains motivated to participate. Pt able to demo progress with sitting balance though limitations still evident for dynamic tasks. Pt also able to demo improving sliding board transfers with Min A x 2 to wheelchair with cues for sequencing and manual assist to maintain optimal ankle alignment. Pt able to demo basic grooming tasks seated at sink with Setup assist (to open containers) but able to demo improving coordination. Began education on compensatory strategies for LB ADLs with lateral leans with plans to further address in next OT session. Continue to recommend CIR for intensive therapies to maximize ADL independence.    Recommendations for follow up therapy are one component of a multi-disciplinary discharge planning process, led by the attending physician.  Recommendations may be updated based on patient status, additional functional criteria and insurance authorization.    Follow Up Recommendations  CIR;Supervision/Assistance - 24 hour    Equipment Recommendations  Other (comment) (defer to next venue)    Recommendations for Other Services Rehab consult    Precautions / Restrictions Precautions Precautions: Fall Precaution Comments:  bladder/bowel incontinence Required Braces or Orthoses: Other Brace Other Brace: bilateral ankle air splints. Restrictions Weight Bearing Restrictions: No       Mobility Bed Mobility Overal bed mobility: Needs Assistance Bed Mobility: Rolling;Sidelying to Sit Rolling: Min assist Sidelying to sit: Min assist;+2 for safety/equipment       General bed mobility comments: Min A to roll to R side, close to Mod A to roll to L side for peri care and changing of bed pads. MIn A x 2 for safety in complete log rolling to get EOB. Assist to advance LEs off of bed (improving initiation), cues for hand placement and pushing up from bed    Transfers Overall transfer level: Needs assistance Equipment used: Sliding board Transfers: Lateral/Scoot Transfers          Lateral/Scoot Transfers: Min assist;+2 safety/equipment;With slide board General transfer comment: Min A x 2 for safety and guarding for sliding board, assist to position B feet/ankles properly to prevent injury, cues for sequencing task effectively with good carryover    Balance Overall balance assessment: Needs assistance Sitting-balance support: Feet supported Sitting balance-Leahy Scale: Fair Sitting balance - Comments: Able to maintain static sitting EOB with and without UE support. min guard for leaning to side for sliding board placement                                   ADL either performed or assessed with clinical judgement   ADL Overall ADL's : Needs assistance/impaired     Grooming: Set up;Sitting;Oral care;Brushing hair Grooming Details (indicate cue type and reason): assist to open toothpaste tube (remove cap) but able to place toothpaste on toothbrush  without difficulty. Did request assist to brush back of hair for thoroughness but pt likely able to do this     Lower Body Bathing: Maximal assistance;Bed level Lower Body Bathing Details (indicate cue type and reason): to bathe peri region after  purewick malfunction. able to assist well in rolling                       General ADL Comments: Session focused on progression of sliding board transfers, assessment of fine motor coordination with tasks and education of compensatory strategies for LB ADLs (educated on trial of lateral leans due to improving sitting balance)     Vision   Vision Assessment?: No apparent visual deficits   Perception     Praxis      Cognition Arousal/Alertness: Awake/alert Behavior During Therapy: WFL for tasks assessed/performed Overall Cognitive Status: Within Functional Limits for tasks assessed                                 General Comments: pt able to follow all commands and demo good safety awareness and recall of techniques        Exercises     Shoulder Instructions       General Comments VSS on RA    Pertinent Vitals/ Pain       Pain Assessment: No/denies pain Pain Intervention(s): Monitored during session  Home Living                                          Prior Functioning/Environment              Frequency  Min 2X/week        Progress Toward Goals  OT Goals(current goals can now be found in the care plan section)  Progress towards OT goals: Progressing toward goals  Acute Rehab OT Goals Patient Stated Goal: Continue with rehab OT Goal Formulation: With patient Time For Goal Achievement: 03/28/21 Potential to Achieve Goals: Good ADL Goals Pt Will Perform Grooming: with set-up;sitting Pt Will Perform Upper Body Bathing: with set-up;sitting Pt Will Perform Upper Body Dressing: with set-up;sitting Pt Will Transfer to Toilet: with mod assist;with transfer board Pt/caregiver will Perform Home Exercise Program: Increased strength;Both right and left upper extremity;With theraband;With Supervision;With written HEP provided Additional ADL Goal #1: Pt will complete bed mobility with min assist and maintain sitting balance  with min guard for 5 minutes as precursor to ADLs.  Plan Discharge plan remains appropriate;Frequency remains appropriate    Co-evaluation    PT/OT/SLP Co-Evaluation/Treatment: Yes Reason for Co-Treatment: Complexity of the patient's impairments (multi-system involvement);For patient/therapist safety;To address functional/ADL transfers   OT goals addressed during session: ADL's and self-care      AM-PAC OT "6 Clicks" Daily Activity     Outcome Measure   Help from another person eating meals?: A Little Help from another person taking care of personal grooming?: A Little Help from another person toileting, which includes using toliet, bedpan, or urinal?: A Lot Help from another person bathing (including washing, rinsing, drying)?: A Lot Help from another person to put on and taking off regular upper body clothing?: A Little Help from another person to put on and taking off regular lower body clothing?: A Lot 6 Click Score: 15    End of Session Equipment Utilized  During Treatment: Gait belt;Other (comment) (sliding board)  OT Visit Diagnosis: Other abnormalities of gait and mobility (R26.89);Muscle weakness (generalized) (M62.81);Other symptoms and signs involving cognitive function;Other symptoms and signs involving the nervous system (R29.898) Pain - Right/Left: Right Pain - part of body: Leg   Activity Tolerance Patient tolerated treatment well   Patient Left with call bell/phone within reach;with family/visitor present;Other (comment) (with PT; pt in wheelchair)   Nurse Communication Mobility status        Time: 8721-5872 OT Time Calculation (min): 32 min  Charges: OT General Charges $OT Visit: 1 Visit OT Treatments $Self Care/Home Management : 8-22 mins  Malachy Chamber, OTR/L Acute Rehab Services Office: [phone]   Layla Maw 03/16/2021, 2:20 PM

## 2021-03-16 NOTE — Progress Notes (Addendum)
Progress Note  Patient Name: Angela Morales Date of Encounter: 03/16/2021  [facility] Cardiologist: Angela Klein, MD   Subjective   Doing OK, had a quiet weekend  Inpatient Medications    Scheduled Meds:  apixaban  5 mg Oral BID   bethanechol  20 mg Oral TID   cyclobenzaprine  5 mg Oral BID   feeding supplement  1 Container Oral BID BM   gabapentin  400 mg Oral QHS   latanoprost  1 drop Left Eye QHS   pantoprazole  40 mg Oral Daily   polyethylene glycol  17 g Oral Daily   potassium chloride  60 mEq Oral Daily   timolol  1 drop Left Eye BID   Continuous Infusions:  amiodarone 30 mg/hr (03/15/21 2043)   PRN Meds: acetaminophen, oxyCODONE, polyvinyl alcohol, senna-docusate, traZODone   Vital Signs    Vitals:   03/15/21 1712 03/15/21 2103 03/15/21 2300 03/16/21 0459  BP: 132/79 126/83 136/87 127/87  Pulse: (!) 106 (!) 106 98 92  Resp: 20 20 20 17   Temp: 98.6 F (37 C) 97.6 F (36.4 C) 97.8 F (36.6 C) (!) 97.5 F (36.4 C)  TempSrc: Oral Oral Oral Oral  SpO2: 97% 99% 97% 98%  Weight:      Height:        Intake/Output Summary (Last 24 hours) at 03/16/2021 0710 Last data filed at 03/16/2021 0644 Gross per 24 hour  Intake 430.5 ml  Output --  Net 430.5 ml   Last 3 Weights 03/13/2021 03/11/2021 03/11/2021  Weight (lbs) 141 lb 1.5 oz 141 lb 8.6 oz 139 lb 5.3 oz  Weight (kg) 64 kg 64.2 kg 63.2 kg      Telemetry    AFib/flutter 90's - Personally Reviewed  ECG    No new EKGs - Personally Reviewed  Physical Exam   GEN: No acute distress.   Neck: No JVD Cardiac: irreg-irreg, no murmurs, rubs, or gallops.  Respiratory: Clear to auscultation bilaterally. GI: Soft, nontender, non-distended  MS: No edema; No deformity. Neuro:  known b/l UEandLE weakness, LE worse Psych: Normal affect   Labs    High Sensitivity Troponin:  No results for input(s): TROPONINIHS in the last 720 hours.   Chemistry Recent Labs  Lab 03/13/21 0907 03/13/21 1220  03/14/21 0131  NA  --  134* 134*  K  --  4.3 3.5  CL  --  101 100  CO2  --  26 25  GLUCOSE  --  99 94  BUN  --  5* 5*  CREATININE  --  0.45 0.41*  CALCIUM  --  8.3* 8.4*  MG 2.0  --   --   GFRNONAA  --  >60 >60  ANIONGAP  --  7 9    Lipids No results for input(s): CHOL, TRIG, HDL, LABVLDL, LDLCALC, CHOLHDL in the last 168 hours.  Hematology Recent Labs  Lab 03/13/21 1220 03/14/21 0131  WBC 5.7 6.4  RBC 3.68* 3.88  HGB 12.0 12.3  HCT 36.1 38.2  MCV 98.1 98.5  MCH 32.6 31.7  MCHC 33.2 32.2  RDW 14.7 14.8  PLT 201 210   Thyroid No results for input(s): TSH, FREET4 in the last 168 hours.  BNPNo results for input(s): BNP, PROBNP in the last 168 hours.  DDimer No results for input(s): DDIMER in the last 168 hours.   Radiology    No results found.  Cardiac Studies   12/04/20: TTE IMPRESSIONS   1. Left ventricular  ejection fraction, by estimation, is 55%. The left  ventricle has normal function. The left ventricle has no regional wall  motion abnormalities. Left ventricular diastolic parameters were grossly  normal.   2. Right ventricular systolic function is normal. The right ventricular  size is mildly enlarged. There is normal pulmonary artery systolic  pressure. The estimated right ventricular systolic pressure is 26.8 mmHg.   3. Left atrial size was moderately dilated.   4. Right atrial size was mild to moderately dilated.   5. The mitral valve is normal in structure. Mild mitral valve  regurgitation. No evidence of mitral stenosis.   6. Tricuspid valve regurgitation is mild to moderate.   7. The aortic valve is grossly normal. There is mild calcification of the  aortic valve. Aortic valve regurgitation is not visualized. No aortic  stenosis is present.   8. The inferior vena cava is normal in size with greater than 50%  respiratory variability, suggesting right atrial pressure of 3 mmHg.    01/28/21: EPS/ablation CONCLUSIONS: 1. Atrial fibrillation upon  presentation.   2. Successful electrical isolation and anatomical encircling of all four pulmonary veins with radiofrequency current.  A WACA approach was used 3. Additional left atrial ablation was performed with a Morales box lesion created along the posterior wall of the left atrium 4. Atrial fibrillation successfully cardioverted to sinus rhythm. 5. No early apparent complications.  Patient Profile     75 y.o. female with a hx of AFib, AFlutter, celiac disease, DDD, GBS admitted with recurrent AFlutter w/RVR from rehab.  Angela Morales was recently admitted to [facility] 02/05/21 with rapidly progressive weakness with eventually inability to ambulate, developed respiratory failure she as diagnosed with GBS and managed with CCM.She completed IVIG, treated for pneumonia during her stay, had PAFib/flutter, SIADH, sepsis, eventually weaned off trach. She did have amiodarone and a DCCV during her stay. Discharge to CIR 03/04/21 on her eliquis, amiodarone 254m daily, and toprol 216mdaily seems to have been  making slow steady progress though with ongoing deficits, intake improving    91522 vitals noted HR 110's and ultimately with some BPs into the 90's discharged from rehab back to in patient active hospitalization.   EP consulted, started on amimo gtt, unable to get DCCT until today   Assessment & Plan    Persistenat AFib S/p ablation 01/28/21 New AFlutter, typical CHA2DS2Vasc is 3, on eliquis  Rates better on amio gtt, remains in an AFlutter coarse AFib  In review of her prior in patient, and rehab to here, since her last DCCV she has not had interruption in her Eliquis Planned for DCCV today  Dr. CaCurt Bearsas seen and examined the patient this AM  Plan to transition to PO amiodarone post DCCV, if stable post DCCV would be OK to discharge [facility] [place] 40073maily for 2 weeks then 200m3mily  Further with IM team    For questions or updates, please contact  [facility] consult [email or web address] for contact info under        Signed, Angela Morales  03/16/2021, 7:10 AM    I have seen and examined this patient with Angela Standardgree with above, note added to reflect my findings.  On exam, irregular, no murmurs.  Patient remains in atrial flutter.  She is planned for cardioversion today.  We Krystol Rocco transition to p.o. amiodarone post cardioversion.  Narek Kniss need 400 mg a day for 2 weeks followed by  200 mg daily.  As she Christan Ciccarelli likely be in sinus rhythm, would be okay for her to return to rehab after cardioversion.  EP to sign off if she is back in normal rhythm later today.  We Lucresia Simic arrange for follow-up in EP clinic.  Jaxzen Vanhorn M. Kaziah Krizek MD 03/16/2021 8:13 AM

## 2021-03-16 NOTE — CV Procedure (Signed)
    Cardioversion Note  Angela Morales [id number] [birth year]  Procedure: DC Cardioversion Indications: Atrial Flutter   Procedure Details Consent: Obtained Time Out: Verified patient identification, verified procedure, site/side was marked, verified correct patient position, special equipment/implants available, Radiology Safety Procedures followed,  medications/allergies/relevent history reviewed, required imaging and test results available.  Performed  The patient has been on adequate anticoagulation.  The patient received IV Lidocaine 60 mg followed by Proporol 70 mg IV  for sedation.  Synchronous cardioversion was performed at 75  joules.  The cardioversion was successful.     Complications: No apparent complications Patient did tolerate procedure well.   Thayer Headings, Brooke Bonito., MD, [facility] 03/16/2021, 9:10 AM

## 2021-03-16 NOTE — Progress Notes (Signed)
PROGRESS NOTE    Angela Morales   TML:[id number]  DOB: Oct 05, 1945  DOA: 03/13/2021 PCP: Deland Pretty, MD   Brief Narrative:  Angela Morales 75 year old female with atrial fibrillation status post ablation, SVT, lumbar scoliosis, celiac disease who recently was hospitalized for Guillain-Barr syndrome.  Hospitalization was complicated neuromuscular respiratory failure requiring intubation, septic shock, staph aureus pneumonia (right lower lobe), tracheostomy, hyponatremia and A. fib with RVR.  The patient was subsequently discharged to [facility] inpatient rehab be treated for severe deconditioning. While in CIR, the patient developed persistent tachycardia and EKG revealed atrial flutter.  [facility] was contacted and patient was readmitted to [facility].  She was started on an Amiodarone infusion and given 1 L fluid bolus for hypotension. Cardiology was consulted.  Subjective: No new complaints.     Assessment & Plan:   Principal Problem:   Atrial flutter with rapid ventricular response (paroxysmal) -Ablated 8/3 -He has been receiving 200 mg of amiodarone daily while at CIR --Continue amiodarone infusion per cardiology/electrophysiology - Continue Eliquis - cardioverted today- monitor for 24 mor hours and then dc to CIR  Active Problems:   GBS (Guillain Barre syndrome)  Chronic pain and degenerative disc disease -Given 2 rounds of IVIG by neurology during prior hospital admission -Per notes, Guillain-Barr was idiopathic versus postprocedural related to cardiac ablation -She was decannulated on 9/6 and cor track removed -Continue to work with PT as able with plan to return to CIR once stable -Can continue to use Neurontin, Cymbalta and low-dose oxycodone as needed to control pain - Continue Flexeril twice daily to control spasticity    Hyponatremia-SIADH - sodium 131> 134> 131 - She received IV fluids (normal saline) initially - I dc'd fluids on 9/16  -  continue to fluid restrict and follow sodium intermittently    Urinary retention -She receives bethanechol and Flomax but states that she still, intermittently, has required in and out catheterizations due to inability to urinate - Continue bethanechol and Flomax   Insomnia - Started trial of trazodone- working well  Celiac disease - Gluten-free diet ordered  Glaucoma - Continue timolol   Time spent in minutes: 30 DVT prophylaxis:  apixaban (ELIQUIS) tablet 5 mg  Code Status: Full code Family Communication:  Level of Care: Level of care: Progressive Disposition Plan:  Status is: Inpatient  Remains inpatient appropriate because:IV treatments appropriate due to intensity of illness or inability to take PO and Inpatient level of care appropriate due to severity of illness  Dispo: The patient is from: Home              Anticipated d/c is to: CIR              Patient currently is not medically stable to d/c.   Difficult to place patient No  Consultants:  EP Procedures:  none Antimicrobials:  Anti-infectives (From admission, onward)    None        Objective: Vitals:   03/16/21 1030 03/16/21 1045 03/16/21 1100 03/16/21 1130  BP: 124/69 114/65 118/76 107/64  Pulse: 77 81 79 78  Resp: 14 20 19 19   Temp:      TempSrc:      SpO2: 99% 93% 97% 97%  Weight:      Height:        Intake/Output Summary (Last 24 hours) at 03/16/2021 1246 Last data filed at 03/16/2021 0909 Gross per 24 hour  Intake 530.5 ml  Output --  Net 530.5 ml  Filed Weights   03/13/21 2224 03/16/21 0831  Weight: 64 kg 64 kg    Examination: General exam: Appears comfortable  HEENT: PERRLA, oral mucosa moist, no sclera icterus or thrush Respiratory system: Clear to auscultation. Respiratory effort normal. Cardiovascular system: S1 & S2 heard, regular rate and rhythm Gastrointestinal system: Abdomen soft, non-tender, nondistended. Normal bowel sounds   Central nervous system: Alert and  oriented. 4/5 strength in LE normal strength in UE Extremities: No cyanosis, clubbing or edema Skin: No rashes or ulcers Psychiatry:  Mood & affect appropriate.      Data Reviewed: I have personally reviewed following labs and imaging studies  CBC: Recent Labs  Lab 03/13/21 1220 03/14/21 0131  WBC 5.7 6.4  HGB 12.0 12.3  HCT 36.1 38.2  MCV 98.1 98.5  PLT 201 818    Basic Metabolic Panel: Recent Labs  Lab 03/13/21 0907 03/13/21 1220 03/14/21 0131 03/16/21 0141  NA  --  134* 134* 131*  K  --  4.3 3.5 3.8  CL  --  101 100 97*  CO2  --  26 25 26   GLUCOSE  --  99 94 90  BUN  --  5* 5* 5*  CREATININE  --  0.45 0.41* 0.46  CALCIUM  --  8.3* 8.4* 8.1*  MG 2.0  --   --   --     GFR: Estimated Creatinine Clearance: 52.5 mL/min (by C-G formula based on SCr of 0.46 mg/dL). Liver Function Tests: No results for input(s): AST, ALT, ALKPHOS, BILITOT, PROT, ALBUMIN in the last 168 hours. No results for input(s): LIPASE, AMYLASE in the last 168 hours. No results for input(s): AMMONIA in the last 168 hours. Coagulation Profile: No results for input(s): INR, PROTIME in the last 168 hours. Cardiac Enzymes: No results for input(s): CKTOTAL, CKMB, CKMBINDEX, TROPONINI in the last 168 hours. BNP (last 3 results) No results for input(s): PROBNP in the last 8760 hours. HbA1C: No results for input(s): HGBA1C in the last 72 hours. CBG: No results for input(s): GLUCAP in the last 168 hours.  Lipid Profile: No results for input(s): CHOL, HDL, LDLCALC, TRIG, CHOLHDL, LDLDIRECT in the last 72 hours. Thyroid Function Tests: No results for input(s): TSH, T4TOTAL, FREET4, T3FREE, THYROIDAB in the last 72 hours. Anemia Panel: No results for input(s): VITAMINB12, FOLATE, FERRITIN, TIBC, IRON, RETICCTPCT in the last 72 hours. Urine analysis:    Component Value Date/Time   COLORURINE YELLOW 02/06/2021 1107   APPEARANCEUR CLEAR 02/06/2021 1107   LABSPEC 1.043 (H) 02/06/2021 1107   PHURINE  6.0 02/06/2021 1107   GLUCOSEU 50 (A) 02/06/2021 1107   HGBUR NEGATIVE 02/06/2021 1107   BILIRUBINUR NEGATIVE 02/06/2021 1107   KETONESUR 20 (A) 02/06/2021 1107   PROTEINUR NEGATIVE 02/06/2021 1107   UROBILINOGEN 0.2 12/01/2013 1339   NITRITE NEGATIVE 02/06/2021 1107   LEUKOCYTESUR NEGATIVE 02/06/2021 1107   Sepsis Labs: @LABRCNTIP (procalcitonin:4,lacticidven:4) )No results found for this or any previous visit (from the past 240 hour(s)).       Radiology Studies: No results found.    Scheduled Meds:  amiodarone  400 mg Oral Daily   apixaban  5 mg Oral BID   bethanechol  20 mg Oral TID   cyclobenzaprine  5 mg Oral BID   feeding supplement  1 Container Oral BID BM   gabapentin  400 mg Oral QHS   latanoprost  1 drop Left Eye QHS   pantoprazole  40 mg Oral Daily   polyethylene glycol  17 g Oral  Daily   potassium chloride  60 mEq Oral Daily   timolol  1 drop Left Eye BID   Continuous Infusions:     LOS: 3 days      Debbe Odea, MD [facility] Pager: [email or web address] 03/16/2021, 12:46 PM

## 2021-03-16 NOTE — Anesthesia Preprocedure Evaluation (Addendum)
Anesthesia Evaluation  Patient identified by MRN, date of birth, ID band Patient awake    Reviewed: Allergy & Precautions, NPO status , Patient's Chart, lab work & pertinent test results  History of Anesthesia Complications (+) PONV and history of anesthetic complications  Airway Mallampati: II  TM Distance: >3 FB Neck ROM: Full    Dental no notable dental hx.    Pulmonary  S.p trach   Pulmonary exam normal breath sounds clear to auscultation       Cardiovascular  Rhythm:Regular Rate:Tachycardia     Neuro/Psych GBS (Guillain-Barre syndrome)   Neuromuscular disease negative psych ROS   GI/Hepatic negative GI ROS, Neg liver ROS,   Endo/Other  negative endocrine ROS  Renal/GU negative Renal ROS     Musculoskeletal  (+) Arthritis ,   Abdominal   Peds  Hematology negative hematology ROS (+)   Anesthesia Other Findings A-fib  Reproductive/Obstetrics                            Anesthesia Physical Anesthesia Plan  ASA: 4  Anesthesia Plan: General   Post-op Pain Management:    Induction: Intravenous  PONV Risk Score and Plan: 4 or greater and Propofol infusion and Treatment may vary due to age or medical condition  Airway Management Planned: Mask  Additional Equipment:   Intra-op Plan:   Post-operative Plan:   Informed Consent: I have reviewed the patients History and Physical, chart, labs and discussed the procedure including the risks, benefits and alternatives for the proposed anesthesia with the patient or authorized representative who has indicated his/her understanding and acceptance.       Plan Discussed with: CRNA  Anesthesia Plan Comments:         Anesthesia Quick Evaluation

## 2021-03-16 NOTE — Transfer of Care (Signed)
Immediate Anesthesia Transfer of Care Note  Patient: Angela Morales  Procedure(s) Performed: CARDIOVERSION  Patient Location: Endoscopy Unit  Anesthesia Type:General  Level of Consciousness: drowsy and patient cooperative  Airway & Oxygen Therapy: Patient Spontanous Breathing  Post-op Assessment: Report given to RN and Post -op Vital signs reviewed and stable  Post vital signs: Reviewed and stable  Last Vitals:  Vitals Value Taken Time  BP 93/50 03/16/21 0914  Temp 36.2 C 03/16/21 0914  Pulse 73 03/16/21 0914  Resp 17 03/16/21 0914  SpO2 100 % 03/16/21 0914    Last Pain:  Vitals:   03/16/21 0914  TempSrc: Temporal  PainSc:       Patients Stated Pain Goal: 1 (25/52/58 9483)  Complications: No notable events documented.

## 2021-03-16 NOTE — H&P (View-Only) (Signed)
Progress Note  Patient Name: Angela Morales Date of Encounter: 03/16/2021  [facility] Cardiologist: Sanda Klein, MD   Subjective   Doing OK, had a quiet weekend  Inpatient Medications    Scheduled Meds:  apixaban  5 mg Oral BID   bethanechol  20 mg Oral TID   cyclobenzaprine  5 mg Oral BID   feeding supplement  1 Container Oral BID BM   gabapentin  400 mg Oral QHS   latanoprost  1 drop Left Eye QHS   pantoprazole  40 mg Oral Daily   polyethylene glycol  17 g Oral Daily   potassium chloride  60 mEq Oral Daily   timolol  1 drop Left Eye BID   Continuous Infusions:  amiodarone 30 mg/hr (03/15/21 2043)   PRN Meds: acetaminophen, oxyCODONE, polyvinyl alcohol, senna-docusate, traZODone   Vital Signs    Vitals:   03/15/21 1712 03/15/21 2103 03/15/21 2300 03/16/21 0459  BP: 132/79 126/83 136/87 127/87  Pulse: (!) 106 (!) 106 98 92  Resp: 20 20 20 17   Temp: 98.6 F (37 C) 97.6 F (36.4 C) 97.8 F (36.6 C) (!) 97.5 F (36.4 C)  TempSrc: Oral Oral Oral Oral  SpO2: 97% 99% 97% 98%  Weight:      Height:        Intake/Output Summary (Last 24 hours) at 03/16/2021 0710 Last data filed at 03/16/2021 0644 Gross per 24 hour  Intake 430.5 ml  Output --  Net 430.5 ml   Last 3 Weights 03/13/2021 03/11/2021 03/11/2021  Weight (lbs) 141 lb 1.5 oz 141 lb 8.6 oz 139 lb 5.3 oz  Weight (kg) 64 kg 64.2 kg 63.2 kg      Telemetry    AFib/flutter 90's - Personally Reviewed  ECG    No new EKGs - Personally Reviewed  Physical Exam   GEN: No acute distress.   Neck: No JVD Cardiac: irreg-irreg, no murmurs, rubs, or gallops.  Respiratory: Clear to auscultation bilaterally. GI: Soft, nontender, non-distended  MS: No edema; No deformity. Neuro:  known b/l UEandLE weakness, LE worse Psych: Normal affect   Labs    High Sensitivity Troponin:  No results for input(s): TROPONINIHS in the last 720 hours.   Chemistry Recent Labs  Lab 03/13/21 0907 03/13/21 1220  03/14/21 0131  NA  --  134* 134*  K  --  4.3 3.5  CL  --  101 100  CO2  --  26 25  GLUCOSE  --  99 94  BUN  --  5* 5*  CREATININE  --  0.45 0.41*  CALCIUM  --  8.3* 8.4*  MG 2.0  --   --   GFRNONAA  --  >60 >60  ANIONGAP  --  7 9    Lipids No results for input(s): CHOL, TRIG, HDL, LABVLDL, LDLCALC, CHOLHDL in the last 168 hours.  Hematology Recent Labs  Lab 03/13/21 1220 03/14/21 0131  WBC 5.7 6.4  RBC 3.68* 3.88  HGB 12.0 12.3  HCT 36.1 38.2  MCV 98.1 98.5  MCH 32.6 31.7  MCHC 33.2 32.2  RDW 14.7 14.8  PLT 201 210   Thyroid No results for input(s): TSH, FREET4 in the last 168 hours.  BNPNo results for input(s): BNP, PROBNP in the last 168 hours.  DDimer No results for input(s): DDIMER in the last 168 hours.   Radiology    No results found.  Cardiac Studies   12/04/20: TTE IMPRESSIONS   1. Left ventricular  ejection fraction, by estimation, is 55%. The left  ventricle has normal function. The left ventricle has no regional wall  motion abnormalities. Left ventricular diastolic parameters were grossly  normal.   2. Right ventricular systolic function is normal. The right ventricular  size is mildly enlarged. There is normal pulmonary artery systolic  pressure. The estimated right ventricular systolic pressure is 37.6 mmHg.   3. Left atrial size was moderately dilated.   4. Right atrial size was mild to moderately dilated.   5. The mitral valve is normal in structure. Mild mitral valve  regurgitation. No evidence of mitral stenosis.   6. Tricuspid valve regurgitation is mild to moderate.   7. The aortic valve is grossly normal. There is mild calcification of the  aortic valve. Aortic valve regurgitation is not visualized. No aortic  stenosis is present.   8. The inferior vena cava is normal in size with greater than 50%  respiratory variability, suggesting right atrial pressure of 3 mmHg.    01/28/21: EPS/ablation CONCLUSIONS: 1. Atrial fibrillation upon  presentation.   2. Successful electrical isolation and anatomical encircling of all four pulmonary veins with radiofrequency current.  A WACA approach was used 3. Additional left atrial ablation was performed with a standard box lesion created along the posterior wall of the left atrium 4. Atrial fibrillation successfully cardioverted to sinus rhythm. 5. No early apparent complications.  Patient Profile     75 y.o. female with a hx of AFib, AFlutter, celiac disease, DDD, GBS admitted with recurrent AFlutter w/RVR from rehab.  Ms. Sen was recently admitted to [facility] 02/05/21 with rapidly progressive weakness with eventually inability to ambulate, developed respiratory failure she as diagnosed with GBS and managed with CCM.She completed IVIG, treated for pneumonia during her stay, had PAFib/flutter, SIADH, sepsis, eventually weaned off trach. She did have amiodarone and a DCCV during her stay. Discharge to CIR 03/04/21 on her eliquis, amiodarone 25m daily, and toprol 26mdaily seems to have been  making slow steady progress though with ongoing deficits, intake improving    91522 vitals noted HR 110's and ultimately with some BPs into the 90's discharged from rehab back to in patient active hospitalization.   EP consulted, started on amimo gtt, unable to get DCCT until today   Assessment & Plan    Persistenat AFib S/p ablation 01/28/21 New AFlutter, typical CHA2DS2Vasc is 3, on eliquis  Rates better on amio gtt, remains in an AFlutter coarse AFib  In review of her prior in patient, and rehab to here, since her last DCCV she has not had interruption in her Eliquis Planned for DCCV today  Dr. CaCurt Bearsas seen and examined the patient this AM  Plan to transition to PO amiodarone post DCCV, if stable post DCCV would be OK to discharge back to [facility]hab from [facility] 40030maily for 2 weeks then 200m76mily  Further with IM team    For questions or updates, please contact  [facility] consult [email or web address] for contact info under        Signed, ReneBaldwin Jamaica-C  03/16/2021, 7:10 AM    I have seen and examined this patient with ReneTommye Standardgree with above, note added to reflect my findings.  On exam, irregular, no murmurs.  Patient remains in atrial flutter.  She is planned for cardioversion today.  We Maymie Brunke transition to p.o. amiodarone post cardioversion.  Bodhi Moradi need 400 mg a day for 2 weeks followed by  200 mg daily.  As she Kanoa Phillippi likely be in sinus rhythm, would be okay for her to return to rehab after cardioversion.  EP to sign off if she is back in normal rhythm later today.  We Japji Kok arrange for follow-up in EP clinic.  Zamiah Tollett M. Colton Tassin MD 03/16/2021 8:13 AM

## 2021-03-16 NOTE — Progress Notes (Signed)
PT Cancellation Note  Patient Details Name: Angela Morales MRN: [id number] DOB: 08-25-1945   Cancelled Treatment:    Reason Eval/Treat Not Completed: Patient at procedure or test/unavailable as pt off unit for DC cardioversion this AM, will continue to follow and re-attempt as time/schedule allow.   West Carbo, PT, DPT   Acute Rehabilitation Department Pager #: [phone]   Sandra Cockayne 03/16/2021, 9:31 AM

## 2021-03-17 ENCOUNTER — Encounter (HOSPITAL_COMMUNITY): Payer: Self-pay | Admitting: Cardiovascular Disease

## 2021-03-17 ENCOUNTER — Ambulatory Visit: Payer: Medicare Other | Admitting: Cardiology

## 2021-03-17 NOTE — Progress Notes (Addendum)
Telemetry reviewed maintaining SR D/w Dr. Curt Bears as per note yesterday OK to discharge back to rehab from our perspective Amiodarone 457m daily for 2 weeks then 202mdaily Continue eliquis uninterrupted EP follow up is in place.  EP remains available if needed, please recall if needed.  ReTommye StandardPA-C

## 2021-03-17 NOTE — Progress Notes (Signed)
Physical Therapy Treatment Patient Details Name: Angela Morales MRN: [id number] DOB: January 22, 1946 Today's Date: 03/17/2021   History of Present Illness The pt is a 75 y.o. female readmitted from [facility] on 9/16 for persistent tachycardia with heart rate in the 110s but remained asymptomatic. Of note, pt d/c to CIR on 9/7 after admission from 8/11-9/7 due to GBS and hospital course complicated by respiratory failure secondary to aspiration event which required intubation and tracheostomy placement.  Hospital course also complicated by severe hyponatremia, A. fib with RVR. PMH includes: A. fib status post recent ablation on 8/3, SVT, severe lumbar scoliosis (not ambulatory surgery), celiac disease.    PT Comments    Patient progressing slowly since standing limited by extensor tone in ankles pushing into equinovarus position despite shoes and bracing.  She has the strength in upper legs to stand.  She continues to benefit from skilled PT in the acute setting and from follow up CIR level rehab at d/c.     Recommendations for follow up therapy are one component of a multi-disciplinary discharge planning process, led by the attending physician.  Recommendations may be updated based on patient status, additional functional criteria and insurance authorization.  Follow Up Recommendations  CIR     Equipment Recommendations  None recommended by PT    Recommendations for Other Services       Precautions / Restrictions Precautions Precautions: Fall Precaution Comments: bladder/bowel incontinence Required Braces or Orthoses: Other Brace Other Brace: bilateral ankle air splints.     Mobility  Bed Mobility Overal bed mobility: Needs Assistance   Rolling: Supervision Sidelying to sit: Mod assist       General bed mobility comments: assist for legs off bed and help to lift trunk    Transfers Overall transfer level: Needs assistance Equipment used: Sliding board Transfers: Lateral/Scoot  Transfers;Sit to/from Stand Sit to Stand: Max assist;+2 physical assistance        Lateral/Scoot Transfers: Min assist;+2 safety/equipment;With slide board General transfer comment: A of two to help keep feet flat on floor and for some assist wtih scooting; sit to stand pulling up on bed rail, pt with R>L foot going into equinovarus despite shoes on with aircast bilateral and PT on L foot to keep from pushing into eversion, but needed second skilled hand to keep R foot stable  Ambulation/Gait                 Stairs             Wheelchair Mobility    Modified Rankin (Stroke Patients Only)       Balance Overall balance assessment: Needs assistance Sitting-balance support: Feet supported Sitting balance-Leahy Scale: Poor Sitting balance - Comments: can sit with hands on knees but feels unsteady like she needs UE support, close S and cues for head over hips   Standing balance support: Bilateral upper extremity supported Standing balance-Leahy Scale: Zero                              Cognition Arousal/Alertness: Awake/alert Behavior During Therapy: WFL for tasks assessed/performed Overall Cognitive Status: Within Functional Limits for tasks assessed                                        Exercises Total Joint Exercises Towel Squeeze: Strengthening;Both;5 reps;Supine General Exercises - Lower Extremity  Ankle Circles/Pumps: 5 reps;Both;Supine;AROM;AAROM Heel Slides: AAROM;Strengthening;5 reps;Supine (assist for hip flexion, resistance to knee extension) Hip ABduction/ADduction: AAROM;AROM;5 reps;Supine    General Comments        Pertinent Vitals/Pain Pain Assessment: Faces Faces Pain Scale: Hurts little more Pain Location: R hip Pain Descriptors / Indicators: Guarding;Grimacing;Pins and needles;Discomfort Pain Intervention(s): Repositioned;Monitored during session    Home Living                      Prior Function             PT Goals (current goals can now be found in the care plan section) Progress towards PT goals: Progressing toward goals    Frequency    Min 4X/week      PT Plan Current plan remains appropriate    Co-evaluation              AM-PAC PT "6 Clicks" Mobility   Outcome Measure  Help needed turning from your back to your side while in a flat bed without using bedrails?: A Little Help needed moving from lying on your back to sitting on the side of a flat bed without using bedrails?: A Lot Help needed moving to and from a bed to a chair (including a wheelchair)?: A Lot Help needed standing up from a chair using your arms (e.g., wheelchair or bedside chair)?: Total Help needed to walk in hospital room?: Total Help needed climbing 3-5 steps with a railing? : Total 6 Click Score: 10    End of Session Equipment Utilized During Treatment: Gait belt Activity Tolerance: Patient tolerated treatment well Patient left: with call bell/phone within reach;in chair   PT Visit Diagnosis: Other abnormalities of gait and mobility (R26.89);Muscle weakness (generalized) (M62.81);Other symptoms and signs involving the nervous system (R29.898)     Time: 1105-1130 PT Time Calculation (min) (ACUTE ONLY): 25 min  Charges:  $Therapeutic Exercise: 8-22 mins $Therapeutic Activity: 8-22 mins                     Magda Kiel, PT Acute Rehabilitation Services Pager:[phone] Office:[phone] 03/17/2021    Angela Morales 03/17/2021, 12:50 PM

## 2021-03-17 NOTE — Progress Notes (Signed)
Physical Therapy Treatment Patient Details Name: Angela Morales MRN: [id number] DOB: 10/15/1945 Today's Date: 03/17/2021   History of Present Illness The pt is a 75 y.o. female readmitted from [facility] on 9/16 for persistent tachycardia with heart rate in the 110s but remained asymptomatic. Of note, pt d/c to CIR on 9/7 after admission from 8/11-9/7 due to GBS and hospital course complicated by respiratory failure secondary to aspiration event which required intubation and tracheostomy placement.  Hospital course also complicated by severe hyponatremia, A. fib with RVR. PMH includes: A. fib status post recent ablation on 8/3, SVT, severe lumbar scoliosis (not ambulatory surgery), celiac disease.    PT Comments    Patient able to transition safely to bed with +1 A with increased time, cues for anterior weight shift and assist to position feet.  She remains appropriate for CIR level rehab at d/c.    Recommendations for follow up therapy are one component of a multi-disciplinary discharge planning process, led by the attending physician.  Recommendations may be updated based on patient status, additional functional criteria and insurance authorization.  Follow Up Recommendations  CIR     Equipment Recommendations  Other (comment) (TBA)    Recommendations for Other Services       Precautions / Restrictions Precautions Precautions: Fall Precaution Comments: bladder/bowel incontinence Required Braces or Orthoses: Other Brace Other Brace: bilateral ankle air splints.     Mobility  Bed Mobility Overal bed mobility: Needs Assistance   Rolling: Supervision Sidelying to sit: Mod assist     Sit to sidelying: Mod assist General bed mobility comments: assist for legs to supine and for repositioning hips    Transfers Overall transfer level: Needs assistance Equipment used: Sliding board Transfers: Lateral/Scoot Transfers Sit to Stand: Max assist;+2 physical assistance         Lateral/Scoot Transfers: Mod assist General transfer comment: transferred back to bed for safety with slide board with cues for head/hips relationship, assist for foot repositioning and to help with scooting with board under bed pad  Ambulation/Gait                 Stairs             Wheelchair Mobility    Modified Rankin (Stroke Patients Only)       Balance Overall balance assessment: Needs assistance Sitting-balance support: Feet supported Sitting balance-Leahy Scale: Fair Sitting balance - Comments: improved balance sitting while scooting and with cues for anterior weight shift   Standing balance support: Bilateral upper extremity supported Standing balance-Leahy Scale: Zero                              Cognition Arousal/Alertness: Awake/alert Behavior During Therapy: WFL for tasks assessed/performed Overall Cognitive Status: Within Functional Limits for tasks assessed                                        Exercises     General Comments General comments (skin integrity, edema, etc.): daughter in the room      Pertinent Vitals/Pain Pain Assessment: Faces Faces Pain Scale: Hurts little more Pain Location: R hip Pain Descriptors / Indicators: Guarding;Discomfort Pain Intervention(s): Monitored during session;Repositioned    Home Living                      Prior Function  PT Goals (current goals can now be found in the care plan section) Progress towards PT goals: Progressing toward goals    Frequency    Min 4X/week      PT Plan Current plan remains appropriate    Co-evaluation              AM-PAC PT "6 Clicks" Mobility   Outcome Measure  Help needed turning from your back to your side while in a flat bed without using bedrails?: A Little Help needed moving from lying on your back to sitting on the side of a flat bed without using bedrails?: A Lot Help needed moving to and from a  bed to a chair (including a wheelchair)?: A Lot Help needed standing up from a chair using your arms (e.g., wheelchair or bedside chair)?: Total Help needed to walk in hospital room?: Total Help needed climbing 3-5 steps with a railing? : Total 6 Click Score: 10    End of Session Equipment Utilized During Treatment: Gait belt Activity Tolerance: Patient tolerated treatment well Patient left: in bed;with call bell/phone within reach;with bed alarm set   PT Visit Diagnosis: Other abnormalities of gait and mobility (R26.89);Muscle weakness (generalized) (M62.81);Other symptoms and signs involving the nervous system (R29.898)     Time: 1350-1407 PT Time Calculation (min) (ACUTE ONLY): 17 min  Charges:  $Therapeutic Exercise: 8-22 mins $Therapeutic Activity: 8-22 mins                     Magda Kiel, PT Acute Rehabilitation Services TLXBW:[phone] [phone] 03/17/2021    Angela Morales 03/17/2021, 3:03 PM

## 2021-03-17 NOTE — Progress Notes (Signed)
Pt's LFA IV noted to be red and hard at the site. Removed IV and pt politely declined additional IV placement at this time as she has had multiple IVs that have infiltrated. Will continue to monitor.  Jaymes Graff, RN

## 2021-03-17 NOTE — Progress Notes (Signed)
Inpatient Rehab Admissions Coordinator:   Awaiting determination from [facility] Medicare regarding prior auth request for CIR.   Shann Medal, PT, DPT Admissions Coordinator [phone] 03/17/21  2:37 PM

## 2021-03-18 DIAGNOSIS — I4892 Unspecified atrial flutter: Secondary | ICD-10-CM | POA: Diagnosis not present

## 2021-03-18 NOTE — Progress Notes (Addendum)
PROGRESS NOTE    DORENA DORFMAN   OZH:[id number]  DOB: 15-Feb-1946  DOA: 03/13/2021 PCP: Deland Pretty, MD   Brief Narrative:  PAMELIA BOTTO 75 year old female with atrial fibrillation status post ablation, SVT, lumbar scoliosis, celiac disease who recently was hospitalized for Guillain-Barr syndrome.  Hospitalization was complicated neuromuscular respiratory failure requiring intubation, septic shock, staph aureus pneumonia (right lower lobe), tracheostomy, hyponatremia and A. fib with RVR.  The patient was subsequently discharged to [facility] inpatient rehab be treated for severe deconditioning. While in CIR, the patient developed persistent tachycardia and EKG revealed atrial flutter.  [facility] was contacted and patient was readmitted to [facility].  She was started on an Amiodarone infusion and given 1 L fluid bolus for hypotension. Cardiology was consulted.  Now stable to go back to [employer] approval.  Subjective: Patient was seen and examined today.  Stating that she still cannot stand but able to move her legs better.  No other complaints except having mild left knee pain.  No obvious injury.  Assessment & Plan:   Principal Problem:   Atrial flutter with rapid ventricular response (paroxysmal) -Ablated 8/3 -He has been receiving 200 mg of amiodarone daily while at CIR --Continue amiodarone infusion per cardiology/electrophysiology - Continue Eliquis - cardioverted today- monitor for 24 mor hours and then dc to CIR  Active Problems:   GBS (Guillain Barre syndrome)  Chronic pain and degenerative disc disease -Given 2 rounds of IVIG by neurology during prior hospital admission -Per notes, Guillain-Barr was idiopathic versus postprocedural related to cardiac ablation -She was decannulated on 9/6 and cor track removed -Continue to work with PT as able with plan to return to CIR -pending insurance authorization. -Can continue to use Neurontin,  Cymbalta and low-dose oxycodone as needed to control pain - Continue Flexeril twice daily to control spasticity    Hyponatremia-SIADH - sodium 131> 134> 131 - She received IV fluids (normal saline) initially - I dc'd fluids on 9/16  - continue to fluid restrict and follow sodium intermittently    Urinary retention -She receives bethanechol and Flomax but states that she still, intermittently, has required in and out catheterizations due to inability to urinate - Continue bethanechol and Flomax   Insomnia - Started trial of trazodone- working well  Celiac disease - Gluten-free diet ordered  Glaucoma - Continue timolol   Time spent in minutes: 30 DVT prophylaxis:  apixaban (ELIQUIS) tablet 5 mg  Code Status: Full code Family Communication:  Level of Care: Level of care: Progressive Disposition Plan:  Status is: Inpatient  Remains inpatient appropriate because:IV treatments appropriate due to intensity of illness or inability to take PO and Inpatient level of care appropriate due to severity of illness  Dispo: The patient is from: Home              Anticipated d/c is to: CIR              Patient currently is medically stable.   Difficult to place patient No  Consultants:  EP Procedures:  none Antimicrobials:  Anti-infectives (From admission, onward)    None        Objective: Vitals:   03/18/21 0335 03/18/21 0340 03/18/21 0723 03/18/21 1145  BP:  (!) 102/56 117/69 117/68  Pulse: 82 80 84 84  Resp: 15 (!) 22 17 (!) 24  Temp:  97.7 F (36.5 C) 98.3 F (36.8 C) 98.1 F (36.7 C)  TempSrc:  Oral Oral Oral  SpO2: 95% 97%  98% 99%  Weight:      Height:        Intake/Output Summary (Last 24 hours) at 03/18/2021 1518 Last data filed at 03/18/2021 1100 Gross per 24 hour  Intake 120 ml  Output 1800 ml  Net -1680 ml    Filed Weights   03/13/21 2224 03/16/21 0831  Weight: 64 kg 64 kg    Examination: General.  Pleasant elderly lady, in no acute  distress. Pulmonary.  Lungs clear bilaterally, normal respiratory effort. CV.  Regular rate and rhythm, no JVD, rub or murmur. Abdomen.  Soft, nontender, nondistended, BS positive. CNS.  Alert and oriented .  2-3/5 in bilateral lower extremities. Extremities.  No edema, no cyanosis, pulses intact and symmetrical. Psychiatry.  Judgment and insight appears normal.    Data Reviewed: I have personally reviewed following labs and imaging studies  CBC: Recent Labs  Lab 03/13/21 1220 03/14/21 0131  WBC 5.7 6.4  HGB 12.0 12.3  HCT 36.1 38.2  MCV 98.1 98.5  PLT 201 841    Basic Metabolic Panel: Recent Labs  Lab 03/13/21 0907 03/13/21 1220 03/14/21 0131 03/16/21 0141  NA  --  134* 134* 131*  K  --  4.3 3.5 3.8  CL  --  101 100 97*  CO2  --  26 25 26   GLUCOSE  --  99 94 90  BUN  --  5* 5* 5*  CREATININE  --  0.45 0.41* 0.46  CALCIUM  --  8.3* 8.4* 8.1*  MG 2.0  --   --   --     GFR: Estimated Creatinine Clearance: 52.5 mL/min (by C-G formula based on SCr of 0.46 mg/dL). Liver Function Tests: No results for input(s): AST, ALT, ALKPHOS, BILITOT, PROT, ALBUMIN in the last 168 hours. No results for input(s): LIPASE, AMYLASE in the last 168 hours. No results for input(s): AMMONIA in the last 168 hours. Coagulation Profile: No results for input(s): INR, PROTIME in the last 168 hours. Cardiac Enzymes: No results for input(s): CKTOTAL, CKMB, CKMBINDEX, TROPONINI in the last 168 hours. BNP (last 3 results) No results for input(s): PROBNP in the last 8760 hours. HbA1C: No results for input(s): HGBA1C in the last 72 hours. CBG: No results for input(s): GLUCAP in the last 168 hours.  Lipid Profile: No results for input(s): CHOL, HDL, LDLCALC, TRIG, CHOLHDL, LDLDIRECT in the last 72 hours. Thyroid Function Tests: No results for input(s): TSH, T4TOTAL, FREET4, T3FREE, THYROIDAB in the last 72 hours. Anemia Panel: No results for input(s): VITAMINB12, FOLATE, FERRITIN, TIBC, IRON,  RETICCTPCT in the last 72 hours. Urine analysis:    Component Value Date/Time   COLORURINE YELLOW 02/06/2021 1107   APPEARANCEUR CLEAR 02/06/2021 1107   LABSPEC 1.043 (H) 02/06/2021 1107   PHURINE 6.0 02/06/2021 1107   GLUCOSEU 50 (A) 02/06/2021 1107   HGBUR NEGATIVE 02/06/2021 1107   BILIRUBINUR NEGATIVE 02/06/2021 1107   KETONESUR 20 (A) 02/06/2021 1107   PROTEINUR NEGATIVE 02/06/2021 1107   UROBILINOGEN 0.2 12/01/2013 1339   NITRITE NEGATIVE 02/06/2021 1107   LEUKOCYTESUR NEGATIVE 02/06/2021 1107   Sepsis Labs: @LABRCNTIP (procalcitonin:4,lacticidven:4) )No results found for this or any previous visit (from the past 240 hour(s)).    Radiology Studies: No results found.   Scheduled Meds:  amiodarone  400 mg Oral Daily   apixaban  5 mg Oral BID   bethanechol  20 mg Oral TID   cyclobenzaprine  5 mg Oral BID   feeding supplement  1 Container Oral BID BM  gabapentin  400 mg Oral QHS   latanoprost  1 drop Left Eye QHS   pantoprazole  40 mg Oral Daily   polyethylene glycol  17 g Oral Daily   potassium chloride  60 mEq Oral Daily   timolol  1 drop Left Eye BID   Continuous Infusions:    LOS: 5 days    Lorella Nimrod, MD [facility] Pager: [email or web address] 03/18/2021, 3:18 PM

## 2021-03-18 NOTE — Progress Notes (Signed)
Physical Therapy Treatment Patient Details Name: Angela Morales MRN: [id number] DOB: 1946-05-30 Today's Date: 03/18/2021   History of Present Illness Pt is a 74 y.o. female recently admitted 02/05/21 for GBS requiring intubation and trach, with d/c to CIR on 03/04/21, now readmitted 03/13/21 with persistent tachycardia and hypotension. Workup for aflutter with RVR. S/p cardioversion 9/19. Other PMH includes afib s/p recent ablation 01/28/21), SVT, severe lumbar scoliosis, celiac disease.   PT Comments    Pt progressing with mobility. Today's session focused on slide board transfer training, seated dynamic balance tasks and LE therex. Pt remains limited by weakness, increased tone, decreased activity tolerance, and impaired balance strategies/postural reactions. Pt remains motivated to participate. Pt is an excellent candidate for return to intensive CIR-level therapies to maximize functional mobility and independence prior to return home.    Recommendations for follow up therapy are one component of a multi-disciplinary discharge planning process, led by the attending physician.  Recommendations may be updated based on patient status, additional functional criteria and insurance authorization.  Follow Up Recommendations  CIR     Equipment Recommendations   (defer)    Recommendations for Other Services Rehab consult     Precautions / Restrictions Precautions Precautions: Fall;Other (comment) Precaution Comments: bladder/bowel incontinence Required Braces or Orthoses: Other Brace Other Brace: bilateral ankle air splints, PRAFOs Restrictions Weight Bearing Restrictions: No     Mobility  Bed Mobility Overal bed mobility: Needs Assistance Bed Mobility: Sit to Sidelying;Rolling Rolling: Min assist Sidelying to sit: Min guard   Sit to supine: Mod assist   General bed mobility comments: ModA for BLE management for return to supine, pt assisting well with UE support on rail     Transfers Overall transfer level: Needs assistance Equipment used: Sliding board Transfers: Lateral/Scoot Transfers          Lateral/Scoot Transfers: Mod assist General transfer comment: ModA for slide board transfer from w/c to bed; pt assiting well with BUE support, pt able to verbally describe technique and method to carry out safe transfers, assist to place slideboard then pt able to remove at end of transfer; requires modA for RLE and maxA for LLE repositioning; verbal cues for hips/head relationship with scooting  Ambulation/Gait                 Stairs             Wheelchair Mobility    Modified Rankin (Stroke Patients Only)       Balance Overall balance assessment: Needs assistance Sitting-balance support: Feet supported Sitting balance-Leahy Scale: Fair Sitting balance - Comments: Increased time working on static and dynamic sitting balance; pt able to reach bilateral feet to untie shoes, close min guard for balance; requires assist to remove shoes/braces Postural control: Posterior lean                                  Cognition Arousal/Alertness: Awake/alert Behavior During Therapy: WFL for tasks assessed/performed Overall Cognitive Status: Within Functional Limits for tasks assessed                                        Exercises General Exercises - Upper Extremity Shoulder Flexion: Strengthening;Both;10 reps;Seated;Theraband Theraband Level (Shoulder Flexion): Level 1 (Yellow) Shoulder Horizontal ABduction: Strengthening;Both;10 reps;Seated;Theraband Theraband Level (Shoulder Horizontal Abduction): Level 1 (Yellow) Elbow Flexion: Strengthening;Both;10  reps;Seated;Theraband Theraband Level (Elbow Flexion): Level 1 (Yellow) Elbow Extension: Strengthening;Both;10 reps;Seated;Theraband Theraband Level (Elbow Extension): Level 1 (Yellow) Other Exercises Other Exercises: Bilateral LAQ (able to perform with full  range when hip flexed slightly (held at knee) to prevent knee hyperextension/instability); bilateral hamstring and gastroc stretch    General Comments General comments (skin integrity, edema, etc.): VSS on RA      Pertinent Vitals/Pain Pain Assessment: Faces Faces Pain Scale: Hurts little more Pain Location: L knee; bilateral hamstrings/lower back with stretching Pain Descriptors / Indicators: Tightness;Sore Pain Intervention(s): Monitored during session;Repositioned    Home Living                      Prior Function            PT Goals (current goals can now be found in the care plan section) Acute Rehab PT Goals Patient Stated Goal: Continue with rehab Progress towards PT goals: Progressing toward goals    Frequency    Min 4X/week      PT Plan Current plan remains appropriate    Co-evaluation              AM-PAC PT "6 Clicks" Mobility   Outcome Measure  Help needed turning from your back to your side while in a flat bed without using bedrails?: A Little Help needed moving from lying on your back to sitting on the side of a flat bed without using bedrails?: A Lot Help needed moving to and from a bed to a chair (including a wheelchair)?: A Lot Help needed standing up from a chair using your arms (e.g., wheelchair or bedside chair)?: Total Help needed to walk in hospital room?: Total Help needed climbing 3-5 steps with a railing? : Total 6 Click Score: 10    End of Session   Activity Tolerance: Patient tolerated treatment well Patient left: in bed;with call bell/phone within reach;with bed alarm set Nurse Communication: Mobility status PT Visit Diagnosis: Other abnormalities of gait and mobility (R26.89);Muscle weakness (generalized) (M62.81);Other symptoms and signs involving the nervous system (R29.898)     Time: 7741-2878 PT Time Calculation (min) (ACUTE ONLY): 31 min  Charges:  $Therapeutic Exercise: 8-22 mins $Therapeutic Activity: 8-22  mins                     Mabeline Caras, PT, DPT Acute Rehabilitation Services  Pager [phone] Office [place] 03/18/2021, 4:04 PM

## 2021-03-18 NOTE — Progress Notes (Signed)
Occupational Therapy Treatment Patient Details Name: Angela Morales MRN: [id number] DOB: 1946-03-19 Today's Date: 03/18/2021   History of present illness The pt is a 75 y.o. female readmitted from [place] on 9/16 for persistent tachycardia with heart rate in the 110s but remained asymptomatic. Of note, pt d/c to CIR on 9/7 after admission from 8/11-9/7 due to GBS and hospital course complicated by respiratory failure secondary to aspiration event which required intubation and tracheostomy placement.  Hospital course also complicated by severe hyponatremia, A. fib with RVR. PMH includes: A. fib status post recent ablation on 8/3, SVT, severe lumbar scoliosis (not ambulatory surgery), celiac disease.   OT comments  Pt continues to demonstrate motivation to progress with therapy. Session focused on sitting balance progression with trial of lateral leans for LB ADLs and completion of UE exercises. Pt with intermittent Min A at most to correct posterior LOB with dynamic tasks. Pt was noted to fatigue with these activities, so required Mod A for sliding board transfer to wheelchair this AM. While in wheelchair, pt completed the remainder of UE exercises via theraband. Provided handout for theraband and theraputty exercises to maximize carryover. Continue to believe pt is a strong CIR candidate.    Recommendations for follow up therapy are one component of a multi-disciplinary discharge planning process, led by the attending physician.  Recommendations may be updated based on patient status, additional functional criteria and insurance authorization.    Follow Up Recommendations  CIR;Supervision/Assistance - 24 hour    Equipment Recommendations  Other (comment) (defer to next venue)    Recommendations for Other Services Rehab consult    Precautions / Restrictions Precautions Precautions: Fall Precaution Comments: bladder/bowel incontinence Required Braces or Orthoses: Other Brace Other Brace:  bilateral ankle air splints. Restrictions Weight Bearing Restrictions: No       Mobility Bed Mobility Overal bed mobility: Needs Assistance Bed Mobility: Rolling;Sidelying to Sit Rolling: Min assist Sidelying to sit: Min guard       General bed mobility comments: Min A to roll to side to lift L LE onto R LE, min guard for safety in pushing trunk upright    Transfers Overall transfer level: Needs assistance Equipment used: Sliding board Transfers: Lateral/Scoot Transfers          Lateral/Scoot Transfers: Mod assist General transfer comment: Mod A for transfer bed to wheelchair. Assist to reposition B feet appropriately, lift bottom on and off of sliding board    Balance Overall balance assessment: Needs assistance Sitting-balance support: Feet supported Sitting balance-Leahy Scale: Fair Sitting balance - Comments: able to statically sit EOB, Min A intermittently for dynamic tasks Postural control: Posterior lean                                 ADL either performed or assessed with clinical judgement   ADL Overall ADL's : Needs assistance/impaired     Grooming: Set up;Sitting;Brushing hair               Lower Body Dressing: Maximal assistance;Bed level Lower Body Dressing Details (indicate cue type and reason): for ankle splints and tennis shoes. Guided pt in lateral leans sitting EOB and reaching to bottom to simulate LB dressing techniques               General ADL Comments: Session focused on UE HEP education with handout provided, lateral leans and balance sitting EOB and sliding board transfer to wheelchair. Provided  handout for theraputty exercises as well.     Vision   Vision Assessment?: No apparent visual deficits   Perception     Praxis      Cognition Arousal/Alertness: Awake/alert Behavior During Therapy: WFL for tasks assessed/performed Overall Cognitive Status: Within Functional Limits for tasks assessed                                           Exercises Exercises: General Upper Extremity General Exercises - Upper Extremity Shoulder Flexion: Strengthening;Both;10 reps;Seated;Theraband Theraband Level (Shoulder Flexion): Level 1 (Yellow) Shoulder Horizontal ABduction: Strengthening;Both;10 reps;Seated;Theraband Theraband Level (Shoulder Horizontal Abduction): Level 1 (Yellow) Elbow Flexion: Strengthening;Both;10 reps;Seated;Theraband Theraband Level (Elbow Flexion): Level 1 (Yellow) Elbow Extension: Strengthening;Both;10 reps;Seated;Theraband Theraband Level (Elbow Extension): Level 1 (Yellow)   Shoulder Instructions       General Comments VSS on RA    Pertinent Vitals/ Pain       Pain Assessment: Faces Faces Pain Scale: Hurts a little bit Pain Location: L knee Pain Descriptors / Indicators: Guarding;Discomfort Pain Intervention(s): Monitored during session;Limited activity within patient's tolerance;Premedicated before session  Home Living                                          Prior Functioning/Environment              Frequency  Min 2X/week        Progress Toward Goals  OT Goals(current goals can now be found in the care plan section)  Progress towards OT goals: Progressing toward goals  Acute Rehab OT Goals Patient Stated Goal: Continue with rehab OT Goal Formulation: With patient Time For Goal Achievement: 03/28/21 Potential to Achieve Goals: Good ADL Goals Pt Will Perform Grooming: with set-up;sitting Pt Will Perform Upper Body Bathing: with set-up;sitting Pt Will Perform Upper Body Dressing: with set-up;sitting Pt Will Transfer to Toilet: with mod assist;with transfer board Pt/caregiver will Perform Home Exercise Program: Increased strength;Both right and left upper extremity;With theraband;With Supervision;With written HEP provided Additional ADL Goal #1: Pt will complete bed mobility with min assist and maintain sitting  balance with min guard for 5 minutes as precursor to ADLs.  Plan Discharge plan remains appropriate;Frequency remains appropriate    Co-evaluation                 AM-PAC OT "6 Clicks" Daily Activity     Outcome Measure   Help from another person eating meals?: A Little Help from another person taking care of personal grooming?: A Little Help from another person toileting, which includes using toliet, bedpan, or urinal?: A Lot Help from another person bathing (including washing, rinsing, drying)?: A Lot Help from another person to put on and taking off regular upper body clothing?: A Little Help from another person to put on and taking off regular lower body clothing?: A Lot 6 Click Score: 15    End of Session Equipment Utilized During Treatment: Gait belt;Other (comment) (sliding board)  OT Visit Diagnosis: Other abnormalities of gait and mobility (R26.89);Muscle weakness (generalized) (M62.81);Other symptoms and signs involving cognitive function;Other symptoms and signs involving the nervous system (R29.898) Pain - Right/Left: Left Pain - part of body: Knee   Activity Tolerance Patient tolerated treatment well   Patient Left with call bell/phone within reach;Other (comment) (in wheelchair)  Nurse Communication Mobility status        Time: 2158-7276 OT Time Calculation (min): 44 min  Charges: OT General Charges $OT Visit: 1 Visit OT Treatments $Self Care/Home Management : 8-22 mins $Therapeutic Activity: 8-22 mins $Therapeutic Exercise: 8-22 mins  Malachy Chamber, OTR/L Acute Rehab Services Office: [phone]   Angela Morales 03/18/2021, 1:25 PM

## 2021-03-19 DIAGNOSIS — I4892 Unspecified atrial flutter: Secondary | ICD-10-CM | POA: Diagnosis not present

## 2021-03-19 LAB — BASIC METABOLIC PANEL
Anion gap: 7 (ref 5–15)
BUN: 6 mg/dL — ABNORMAL LOW (ref 8–23)
CO2: 26 mmol/L (ref 22–32)
Calcium: 8.6 mg/dL — ABNORMAL LOW (ref 8.9–10.3)
Chloride: 99 mmol/L (ref 98–111)
Creatinine, Ser: 0.41 mg/dL — ABNORMAL LOW (ref 0.44–1.00)
GFR, Estimated: >60 mL/min (ref >60)
Glucose, Bld: 98 mg/dL (ref 70–99)
Potassium: 3.7 mmol/L (ref 3.5–5.1)
Sodium: 132 mmol/L — ABNORMAL LOW (ref 135–145)

## 2021-03-19 NOTE — Progress Notes (Signed)
Physical Therapy Treatment Patient Details Name: Angela Morales MRN: [id number] DOB: 30-Jun-1945 Today's Date: 03/19/2021   History of Present Illness Pt is a 75 y.o. female recently admitted 02/05/21 for GBS requiring intubation and trach, with d/c to CIR on 03/04/21, now readmitted 03/13/21 with persistent tachycardia and hypotension. Workup for aflutter with RVR. S/p cardioversion 9/19. Other PMH includes afib s/p recent ablation 01/28/21), SVT, severe lumbar scoliosis, celiac disease.    PT Comments    The pt continues to present eager to return to rehab and motivated to improve mobility. Session focused on LE strengthening, AROM exercises to improve pt ability to achieve neutral positioning, and seated balance challenges. The pt was able to demo good movement with core and UE strength this session, continues to require significant assist to manage lateral scooting and any potential OOB mobility due to weakness in BLE, continues to be a great candidate for CIR therapies.     Recommendations for follow up therapy are one component of a multi-disciplinary discharge planning process, led by the attending physician.  Recommendations may be updated based on patient status, additional functional criteria and insurance authorization.  Follow Up Recommendations  CIR     Equipment Recommendations  Other (comment)    Recommendations for Other Services       Precautions / Restrictions Precautions Precautions: Fall;Other (comment) Precaution Comments: bladder/bowel incontinence Required Braces or Orthoses: Other Brace Other Brace: bilateral ankle air splints, PRAFOs     Mobility  Bed Mobility Overal bed mobility: Needs Assistance Bed Mobility: Rolling;Sidelying to Sit;Sit to Sidelying Rolling: Min assist Sidelying to sit: Min assist     Sit to sidelying: Mod assist General bed mobility comments: minA to BLE to move to EOB, modA to return to bed. pt with good use of BUE to elevate trunk     Transfers Overall transfer level: Needs assistance Equipment used: None Transfers: Lateral/Scoot Transfers          Lateral/Scoot Transfers: Mod assist General transfer comment: modA to scoot along EOB, only completed x1 due to pain and inability to position BLE in neutral to wt bear          Balance Overall balance assessment: Needs assistance Sitting-balance support: Feet supported Sitting balance-Leahy Scale: Fair Sitting balance - Comments: pt able to improve to static and dynamic sitting without UE support, can lean outside midline and recover without use of UE                                    Cognition Arousal/Alertness: Awake/alert Behavior During Therapy: WFL for tasks assessed/performed Overall Cognitive Status: Within Functional Limits for tasks assessed                                 General Comments: pt able to follow all commands and demo good safety awareness and recall of techniques      Exercises General Exercises - Lower Extremity Ankle Circles/Pumps: AROM;Both;10 reps Long Arc Quad: AROM;Both;10 reps;Seated (against min resistance) Hip ABduction/ADduction: AROM;Both;10 reps;Seated Other Exercises Other Exercises: lateral leaning to propped elbow with elevation of other arm for chest opening and trunk rotation. x5 each direction Other Exercises: cross-body reaching and punching 2 x 10 each direction once with single UE support on bed and once without UE support on bed Other Exercises: supine 90-90 HS stretch with contract-relax technique. 3  min total each leg Other Exercises: supine gastroc stretch, 1 min each leg    General Comments General comments (skin integrity, edema, etc.): VSS on RA      Pertinent Vitals/Pain Pain Assessment: Faces Faces Pain Scale: Hurts little more Pain Location: L knee; bilateral hamstrings/lower back with stretching Pain Descriptors / Indicators: Tightness;Sore Pain Intervention(s):  Limited activity within patient's tolerance;Monitored during session;Repositioned     PT Goals (current goals can now be found in the care plan section) Acute Rehab PT Goals Patient Stated Goal: Continue with rehab PT Goal Formulation: With patient/family Time For Goal Achievement: 03/28/21 Potential to Achieve Goals: Good Progress towards PT goals: Progressing toward goals    Frequency    Min 4X/week      PT Plan Current plan remains appropriate       AM-PAC PT "6 Clicks" Mobility   Outcome Measure  Help needed turning from your back to your side while in a flat bed without using bedrails?: A Little Help needed moving from lying on your back to sitting on the side of a flat bed without using bedrails?: A Lot Help needed moving to and from a bed to a chair (including a wheelchair)?: A Lot Help needed standing up from a chair using your arms (e.g., wheelchair or bedside chair)?: Total Help needed to walk in hospital room?: Total Help needed climbing 3-5 steps with a railing? : Total 6 Click Score: 10    End of Session   Activity Tolerance: Patient tolerated treatment well Patient left: in bed;with call bell/phone within reach;with bed alarm set Nurse Communication: Mobility status PT Visit Diagnosis: Other abnormalities of gait and mobility (R26.89);Muscle weakness (generalized) (M62.81);Other symptoms and signs involving the nervous system (R29.898) Hemiplegia - caused by: Unspecified     Time: 1718-1800 PT Time Calculation (min) (ACUTE ONLY): 42 min  Charges:  $Therapeutic Exercise: 23-37 mins $Therapeutic Activity: 8-22 mins                     West Carbo, PT, DPT   Acute Rehabilitation Department Pager #: [phone]   Sandra Cockayne 03/19/2021, 6:55 PM

## 2021-03-19 NOTE — Progress Notes (Addendum)
PROGRESS NOTE    Angela Morales   AQT:[id number]  DOB: 05/26/1946  DOA: 03/13/2021 PCP: Deland Pretty, MD   Brief Narrative:  ARPI DIEBOLD 75 year old female with atrial fibrillation status post ablation, SVT, lumbar scoliosis, celiac disease who recently was hospitalized for Guillain-Barr syndrome.  Hospitalization was complicated neuromuscular respiratory failure requiring intubation, septic shock, staph aureus pneumonia (right lower lobe), tracheostomy, hyponatremia and A. fib with RVR.  The patient was subsequently discharged to [facility] inpatient rehab be treated for severe deconditioning. While in CIR, the patient developed persistent tachycardia and EKG revealed atrial flutter.  [facility] was contacted and patient was readmitted to [facility].  She was started on an Amiodarone infusion and given 1 L fluid bolus for hypotension. Cardiology was consulted-patient underwent DCCV on 03/16/2021 with restoration of normal sinus rhythm.  Amiodarone was converted to p.o.  Now stable to go back to [employer] approval.  Subjective: Patient was lying down comfortably when seen today.  Stating that lower extremity weakness seems improving, now she can lift her legs up from the bed and can sustain it for a short duration. Denies any palpitations or chest pain.  Asking to get out of salt restricted diet.  Assessment & Plan:   Principal Problem:   Atrial flutter with rapid ventricular response (paroxysmal) -Ablated 8/3. S/P DCCV on 03/17/2021.  Remained in sinus rhythm since then. -Continue with Eliquis and amiodarone.  Active Problems:   GBS (Guillain Barre syndrome)  Chronic pain and degenerative disc disease -Given 2 rounds of IVIG by neurology during prior hospital admission -Per notes, Guillain-Barr was idiopathic versus postprocedural related to cardiac ablation -She was decannulated on 9/6 and cor track removed. -Continue to work with PT as able with  plan to return to CIR -pending insurance authorization. -Can continue to use Neurontin, Cymbalta and low-dose oxycodone as needed to control pain - Continue Flexeril twice daily to control spasticity    Hyponatremia-SIADH - sodium 131> 134> 131 - She received IV fluids (normal saline) initially  - continue to fluid restrict and follow sodium intermittently    Urinary retention -She receives bethanechol and Flomax but states that she still, intermittently, has required in and out catheterizations due to inability to urinate - Continue bethanechol and Flomax   Insomnia - Started trial of trazodone- working well  Celiac disease - Gluten-free diet ordered  Glaucoma - Continue timolol   Time spent in minutes: 33 minutes. DVT prophylaxis:  apixaban (ELIQUIS) tablet 5 mg  Code Status: Full code Family Communication:  Level of Care: Level of care: Progressive Disposition Plan:  Status is: Inpatient  Remains inpatient appropriate because:IV treatments appropriate due to intensity of illness or inability to take PO and Inpatient level of care appropriate due to severity of illness  Dispo: The patient is from: Home              Anticipated d/c is to: CIR              Patient currently is medically stable.   Difficult to place patient No  Consultants:  EP  Procedures:  DCCV  Antimicrobials:  Anti-infectives (From admission, onward)    None        Objective: Vitals:   03/18/21 2326 03/19/21 0340 03/19/21 0821 03/19/21 1138  BP: 121/73 105/66 (!) 105/57 119/76  Pulse: 75 70 87 83  Resp: 18 17 14 14   Temp: 97.7 F (36.5 C) 97.7 F (36.5 C) 98.1 F (36.7 C) 98.2 F (  36.8 C)  TempSrc: Oral Oral Oral Oral  SpO2: 97% 97% 98% 99%  Weight:      Height:        Intake/Output Summary (Last 24 hours) at 03/19/2021 1452 Last data filed at 03/18/2021 2200 Gross per 24 hour  Intake 240 ml  Output 1500 ml  Net -1260 ml    Filed Weights   03/13/21 2224 03/16/21 0831   Weight: 64 kg 64 kg    Examination: General.  Well-developed elderly lady, in no acute distress. Pulmonary.  Lungs clear bilaterally, normal respiratory effort. CV.  Regular rate and rhythm, no JVD, rub or murmur. Abdomen.  Soft, nontender, nondistended, BS positive. CNS.  Alert and oriented x3.  3+/5 in bilateral lower extremities. Extremities.  No edema, no cyanosis, pulses intact and symmetrical. Psychiatry.  Judgment and insight appears normal.   Data Reviewed: I have personally reviewed following labs and imaging studies  CBC: Recent Labs  Lab 03/13/21 1220 03/14/21 0131  WBC 5.7 6.4  HGB 12.0 12.3  HCT 36.1 38.2  MCV 98.1 98.5  PLT 201 588    Basic Metabolic Panel: Recent Labs  Lab 03/13/21 0907 03/13/21 1220 03/14/21 0131 03/16/21 0141 03/19/21 0124  NA  --  134* 134* 131* 132*  K  --  4.3 3.5 3.8 3.7  CL  --  101 100 97* 99  CO2  --  26 25 26 26   GLUCOSE  --  99 94 90 98  BUN  --  5* 5* 5* 6*  CREATININE  --  0.45 0.41* 0.46 0.41*  CALCIUM  --  8.3* 8.4* 8.1* 8.6*  MG 2.0  --   --   --   --     GFR: Estimated Creatinine Clearance: 52.5 mL/min (A) (by C-G formula based on SCr of 0.41 mg/dL (L)). Liver Function Tests: No results for input(s): AST, ALT, ALKPHOS, BILITOT, PROT, ALBUMIN in the last 168 hours. No results for input(s): LIPASE, AMYLASE in the last 168 hours. No results for input(s): AMMONIA in the last 168 hours. Coagulation Profile: No results for input(s): INR, PROTIME in the last 168 hours. Cardiac Enzymes: No results for input(s): CKTOTAL, CKMB, CKMBINDEX, TROPONINI in the last 168 hours. BNP (last 3 results) No results for input(s): PROBNP in the last 8760 hours. HbA1C: No results for input(s): HGBA1C in the last 72 hours. CBG: No results for input(s): GLUCAP in the last 168 hours.  Lipid Profile: No results for input(s): CHOL, HDL, LDLCALC, TRIG, CHOLHDL, LDLDIRECT in the last 72 hours. Thyroid Function Tests: No results for  input(s): TSH, T4TOTAL, FREET4, T3FREE, THYROIDAB in the last 72 hours. Anemia Panel: No results for input(s): VITAMINB12, FOLATE, FERRITIN, TIBC, IRON, RETICCTPCT in the last 72 hours. Urine analysis:    Component Value Date/Time   COLORURINE YELLOW 02/06/2021 1107   APPEARANCEUR CLEAR 02/06/2021 1107   LABSPEC 1.043 (H) 02/06/2021 1107   PHURINE 6.0 02/06/2021 1107   GLUCOSEU 50 (A) 02/06/2021 1107   HGBUR NEGATIVE 02/06/2021 1107   BILIRUBINUR NEGATIVE 02/06/2021 1107   KETONESUR 20 (A) 02/06/2021 1107   PROTEINUR NEGATIVE 02/06/2021 1107   UROBILINOGEN 0.2 12/01/2013 1339   NITRITE NEGATIVE 02/06/2021 1107   LEUKOCYTESUR NEGATIVE 02/06/2021 1107   Sepsis Labs: @LABRCNTIP (procalcitonin:4,lacticidven:4) )No results found for this or any previous visit (from the past 240 hour(s)).    Radiology Studies: No results found.   Scheduled Meds:  amiodarone  400 mg Oral Daily   apixaban  5 mg Oral BID  bethanechol  20 mg Oral TID   cyclobenzaprine  5 mg Oral BID   feeding supplement  1 Container Oral BID BM   gabapentin  400 mg Oral QHS   latanoprost  1 drop Left Eye QHS   pantoprazole  40 mg Oral Daily   polyethylene glycol  17 g Oral Daily   potassium chloride  60 mEq Oral Daily   timolol  1 drop Left Eye BID   Continuous Infusions:    LOS: 6 days    Lorella Nimrod, MD [facility] Pager: [email or web address] 03/19/2021, 2:52 PM

## 2021-03-19 NOTE — Care Management Important Message (Signed)
Important Message  Patient Details  Name: Angela Morales MRN: [id number] Date of Birth: May 13, 1946   Medicare Important Message Given:  Yes     Orbie Pyo 03/19/2021, 2:56 PM

## 2021-03-19 NOTE — Progress Notes (Signed)
Inpatient Rehab Admissions Coordinator:   Darrol Angel for an update on prior auth request, as it's been over 48 hours.  Rep let me know that case had been voided as a duplicate (they did not notify me that they were voiding it).  I asked that the case be re-instated and expedited for review as this patient has been ready to return to CIR since 9/20.  I will update pt at bedside and I've let Dr. Reesa Chew and Marvetta Gibbons, RN CM, know.    Shann Medal, PT, DPT Admissions Coordinator [phone] 03/19/21  10:44 AM

## 2021-03-20 ENCOUNTER — Encounter (HOSPITAL_COMMUNITY): Payer: Self-pay | Admitting: Physical Medicine and Rehabilitation

## 2021-03-20 ENCOUNTER — Inpatient Hospital Stay (HOSPITAL_COMMUNITY)
Admission: RE | Admit: 2021-03-20 | Discharge: 2021-04-08 | DRG: 095 | Disposition: A | Discharge status: Home Health | Payer: Medicare Other | Source: Intra-hospital | Attending: Physical Medicine and Rehabilitation | Admitting: Physical Medicine and Rehabilitation

## 2021-03-20 ENCOUNTER — Other Ambulatory Visit: Payer: Self-pay

## 2021-03-20 DIAGNOSIS — G894 Chronic pain syndrome: Secondary | ICD-10-CM

## 2021-03-20 DIAGNOSIS — Z90710 Acquired absence of both cervix and uterus: Secondary | ICD-10-CM

## 2021-03-20 DIAGNOSIS — K592 Neurogenic bowel, not elsewhere classified: Secondary | ICD-10-CM | POA: Diagnosis present

## 2021-03-20 DIAGNOSIS — E876 Hypokalemia: Secondary | ICD-10-CM | POA: Diagnosis present

## 2021-03-20 DIAGNOSIS — J302 Other seasonal allergic rhinitis: Secondary | ICD-10-CM | POA: Diagnosis not present

## 2021-03-20 DIAGNOSIS — N319 Neuromuscular dysfunction of bladder, unspecified: Secondary | ICD-10-CM

## 2021-03-20 DIAGNOSIS — M549 Dorsalgia, unspecified: Secondary | ICD-10-CM | POA: Diagnosis not present

## 2021-03-20 DIAGNOSIS — Z7901 Long term (current) use of anticoagulants: Secondary | ICD-10-CM | POA: Diagnosis not present

## 2021-03-20 DIAGNOSIS — E440 Moderate protein-calorie malnutrition: Secondary | ICD-10-CM | POA: Diagnosis not present

## 2021-03-20 DIAGNOSIS — Z96652 Presence of left artificial knee joint: Secondary | ICD-10-CM | POA: Diagnosis not present

## 2021-03-20 DIAGNOSIS — M1611 Unilateral primary osteoarthritis, right hip: Secondary | ICD-10-CM | POA: Diagnosis present

## 2021-03-20 DIAGNOSIS — H409 Unspecified glaucoma: Secondary | ICD-10-CM | POA: Diagnosis not present

## 2021-03-20 DIAGNOSIS — Z6822 Body mass index (BMI) 22.0-22.9, adult: Secondary | ICD-10-CM

## 2021-03-20 DIAGNOSIS — Z79899 Other long term (current) drug therapy: Secondary | ICD-10-CM | POA: Diagnosis not present

## 2021-03-20 DIAGNOSIS — R339 Retention of urine, unspecified: Secondary | ICD-10-CM | POA: Diagnosis not present

## 2021-03-20 DIAGNOSIS — E871 Hypo-osmolality and hyponatremia: Secondary | ICD-10-CM | POA: Diagnosis not present

## 2021-03-20 DIAGNOSIS — Z91018 Allergy to other foods: Secondary | ICD-10-CM

## 2021-03-20 DIAGNOSIS — Z881 Allergy status to other antibiotic agents status: Secondary | ICD-10-CM

## 2021-03-20 DIAGNOSIS — K900 Celiac disease: Secondary | ICD-10-CM | POA: Diagnosis present

## 2021-03-20 DIAGNOSIS — I4892 Unspecified atrial flutter: Secondary | ICD-10-CM | POA: Diagnosis not present

## 2021-03-20 DIAGNOSIS — E46 Unspecified protein-calorie malnutrition: Secondary | ICD-10-CM | POA: Diagnosis not present

## 2021-03-20 DIAGNOSIS — R159 Full incontinence of feces: Secondary | ICD-10-CM | POA: Diagnosis present

## 2021-03-20 DIAGNOSIS — Z91011 Allergy to milk products: Secondary | ICD-10-CM

## 2021-03-20 DIAGNOSIS — G610 Guillain-Barre syndrome: Secondary | ICD-10-CM

## 2021-03-20 MED ORDER — BETHANECHOL CHLORIDE 10 MG PO TABS
20.0000 mg | ORAL_TABLET | Freq: Three times a day (TID) | ORAL | Status: DC
  Administered 2021-03-20 – 2021-03-23 (×8): 20 mg via ORAL
  Filled 2021-03-20 (×7): qty 2

## 2021-03-20 MED ORDER — GABAPENTIN 400 MG PO CAPS: 400.0000 mg | ORAL_CAPSULE | Freq: Every day | ORAL | Status: DC

## 2021-03-20 MED ORDER — AMIODARONE HCL 400 MG PO TABS
400.0000 mg | ORAL_TABLET | Freq: Every day | ORAL | Status: DC
Start: 2021-03-21 — End: 2021-04-08

## 2021-03-20 MED ORDER — APIXABAN 5 MG PO TABS
5.0000 mg | ORAL_TABLET | Freq: Two times a day (BID) | ORAL | Status: DC
  Administered 2021-03-20 – 2021-04-08 (×38): 5 mg via ORAL
  Filled 2021-03-20 (×38): qty 1

## 2021-03-20 MED ORDER — ALUM & MAG HYDROXIDE-SIMETH 200-200-20 MG/5ML PO SUSP
30.0000 mL | ORAL | Status: DC | PRN
Start: 2021-03-20 — End: 2021-04-08

## 2021-03-20 MED ORDER — POTASSIUM CHLORIDE 20 MEQ PO PACK
60.0000 meq | PACK | Freq: Every day | ORAL | Status: DC
  Administered 2021-03-21 – 2021-03-31 (×11): 60 meq via ORAL
  Filled 2021-03-20 (×11): qty 3

## 2021-03-20 MED ORDER — TIMOLOL MALEATE 0.5 % OP SOLN
1.0000 [drp] | Freq: Two times a day (BID) | OPHTHALMIC | Status: DC
  Administered 2021-03-20 – 2021-04-08 (×38): 1 [drp] via OPHTHALMIC
  Filled 2021-03-20: qty 5

## 2021-03-20 MED ORDER — LATANOPROST 0.005 % OP SOLN
1.0000 [drp] | Freq: Every day | OPHTHALMIC | Status: DC
  Administered 2021-03-20 – 2021-04-07 (×19): 1 [drp] via OPHTHALMIC
  Filled 2021-03-20: qty 2.5

## 2021-03-20 MED ORDER — APIXABAN 5 MG PO TABS: 5.0000 mg | ORAL_TABLET | Freq: Two times a day (BID) | ORAL | Status: DC

## 2021-03-20 MED ORDER — FLEET ENEMA 7-19 GM/118ML RE ENEM: 1.0000 | ENEMA | Freq: Once | RECTAL | Status: DC | PRN

## 2021-03-20 MED ORDER — GABAPENTIN 400 MG PO CAPS
400.0000 mg | ORAL_CAPSULE | Freq: Every day | ORAL | Status: DC
  Administered 2021-03-20 – 2021-03-22 (×3): 400 mg via ORAL
  Filled 2021-03-20 (×3): qty 1

## 2021-03-20 MED ORDER — POLYVINYL ALCOHOL 1.4 % OP SOLN
1.0000 [drp] | OPHTHALMIC | Status: DC | PRN
  Filled 2021-03-20: qty 15

## 2021-03-20 MED ORDER — ACETAMINOPHEN 325 MG PO TABS
325.0000 mg | ORAL_TABLET | ORAL | Status: DC | PRN
  Administered 2021-03-28 – 2021-04-04 (×4): 650 mg via ORAL
  Filled 2021-03-20 (×4): qty 2

## 2021-03-20 MED ORDER — TIMOLOL MALEATE 0.5 % OP SOLN: 1.0000 [drp] | Freq: Two times a day (BID) | OPHTHALMIC | Status: AC

## 2021-03-20 MED ORDER — PANTOPRAZOLE SODIUM 40 MG PO TBEC
40.0000 mg | DELAYED_RELEASE_TABLET | Freq: Every day | ORAL | Status: DC
  Administered 2021-03-21 – 2021-04-08 (×19): 40 mg via ORAL
  Filled 2021-03-20 (×19): qty 1

## 2021-03-20 MED ORDER — OXYCODONE HCL 5 MG PO TABS
2.5000 mg | ORAL_TABLET | ORAL | Status: DC | PRN
  Administered 2021-03-21 – 2021-03-25 (×6): 5 mg via ORAL
  Filled 2021-03-20 (×6): qty 1

## 2021-03-20 MED ORDER — GUAIFENESIN-DM 100-10 MG/5ML PO SYRP: 5.0000 mL | ORAL_SOLUTION | Freq: Four times a day (QID) | ORAL | Status: DC | PRN

## 2021-03-20 MED ORDER — CYCLOBENZAPRINE HCL 5 MG PO TABS
5.0000 mg | ORAL_TABLET | Freq: Two times a day (BID) | ORAL | Status: DC
  Administered 2021-03-20 – 2021-03-22 (×4): 5 mg via ORAL
  Filled 2021-03-20 (×4): qty 1

## 2021-03-20 MED ORDER — AMIODARONE HCL 200 MG PO TABS
400.0000 mg | ORAL_TABLET | Freq: Every day | ORAL | Status: AC
  Administered 2021-03-21 – 2021-03-30 (×10): 400 mg via ORAL
  Filled 2021-03-20 (×10): qty 2

## 2021-03-20 MED ORDER — POLYETHYLENE GLYCOL 3350 17 G PO PACK
17.0000 g | PACK | Freq: Every day | ORAL | Status: DC | PRN
  Administered 2021-03-23: 17 g via ORAL
  Filled 2021-03-20: qty 1

## 2021-03-20 MED ORDER — LIDOCAINE HCL URETHRAL/MUCOSAL 2 % EX GEL: CUTANEOUS | Status: DC | PRN

## 2021-03-20 MED ORDER — SENNOSIDES-DOCUSATE SODIUM 8.6-50 MG PO TABS: 1.0000 | ORAL_TABLET | Freq: Every evening | ORAL | Status: DC | PRN

## 2021-03-20 MED ORDER — PROCHLORPERAZINE MALEATE 5 MG PO TABS
5.0000 mg | ORAL_TABLET | Freq: Four times a day (QID) | ORAL | Status: DC | PRN
  Administered 2021-03-25: 5 mg via ORAL
  Administered 2021-03-31: 10 mg via ORAL
  Filled 2021-03-20 (×2): qty 2

## 2021-03-20 MED ORDER — DIPHENHYDRAMINE HCL 12.5 MG/5ML PO ELIX: 12.5000 mg | ORAL_SOLUTION | Freq: Four times a day (QID) | ORAL | Status: DC | PRN

## 2021-03-20 MED ORDER — JUVEN PO PACK
1.0000 | PACK | Freq: Two times a day (BID) | ORAL | Status: DC
  Administered 2021-03-21 – 2021-03-22 (×3): 1 via ORAL

## 2021-03-20 MED ORDER — TRAZODONE HCL 50 MG PO TABS: 25.0000 mg | ORAL_TABLET | Freq: Every evening | ORAL | Status: DC | PRN

## 2021-03-20 MED ORDER — PROCHLORPERAZINE EDISYLATE 10 MG/2ML IJ SOLN: 5.0000 mg | Freq: Four times a day (QID) | INTRAMUSCULAR | Status: DC | PRN

## 2021-03-20 MED ORDER — ZIOPTAN 0.0015 % OP SOLN: 1.0000 [drp] | Freq: Every evening | OPHTHALMIC | Status: AC

## 2021-03-20 MED ORDER — BISACODYL 10 MG RE SUPP: 10.0000 mg | Freq: Every day | RECTAL | Status: DC | PRN

## 2021-03-20 MED ORDER — PROCHLORPERAZINE 25 MG RE SUPP
12.5000 mg | Freq: Four times a day (QID) | RECTAL | Status: DC | PRN
Start: 2021-03-20 — End: 2021-04-08

## 2021-03-20 NOTE — Progress Notes (Signed)
PMR Admission Coordinator Pre-Admission Assessment   Patient: Angela Morales is an 75 y.o., female MRN: [id number] DOB: 12/17/1945 Height: 5' 4" (162.6 cm) Weight: 64 kg   Insurance Information HMO: yes    PPO:      PCP:      IPA:      80/20:      OTHER:  PRIMARY: [employer] Medicare      Policy#: [id number]      Subscriber: pt CM Name: Philandrea      Phone#: [phone]     Fax#: [phone] Pre-Cert#: [id number] auth for CIR provided by Philndrea with [employer] with updates due to fax listed above on 9/29   Employer:  Benefits:  Phone #: [phone]     Name:  Eff. Date: 06/28/20     Deduct: $0      Out of Pocket Max: $3900 (met $1708.44)      Life Max: n/a CIR: $295/day for days 1-5      SNF: 20 full days Outpatient:      Co-Pay: $30/visit Home Health: 100%      Co-Pay:  DME: 80%     Co-Pay: 20% Providers:  SECONDARY:       Policy#:      Phone#:    Financial Counselor:       Phone#:    The "Data Collection Information Summary" for patients in Inpatient Rehabilitation Facilities with attached "Privacy Act Statement-Health Care Records" was provided and verbally reviewed with: Patient and Family   Emergency Contact Information Contact Information       Name Relation Home Work Mobile    Testerman,Harry Spouse [phone]   [phone]           Current Medical History  Patient Admitting Diagnosis: Guillain Barre Syndrome    History of Present Illness: pt is a 75 y/o female with PMH of afib, s/p ablation on 8/3, and celiac disease who was admitted to [facility] on 8/11 with bilat upper and bil lower extremeity weakness and parasthesias beginning 8/8.  Pt developed ascending weakness with inability to ambulate.  No other prodrome or precipitates other than being started on gabapentin 1 month prior.  LP was deferred initially as she was on Eliquis (last dose 8/11).  IVIG started empirically on 8/12 and she continued to have some progressive weakness and bulbar dysfunction with  difficulty swallong and globus sensation with swallow, and voice weakness.  FVC has been consistently greater than 1.0L , NIF <-25.  She had an episode of desaturation, increased work of breathing and choking when eating Jell-O and pudding evening of 8/13 and required intubation on 8/14.  Underwent trach on 8/16.  Started round 2 of IVIG on 8/18-8/22 per neuro recs.  Developed aflutter on 8/27 and started 8/28 amio drip.  Bouts of hypotension over night, not responding to increased fluids, though to be due to vasoplegia.  She was cardioverted into sinus rhythm on 8/31. Currently tolerating a regular, gluten free diet.  On 28% trach collar.  Therapy evaluations completed and pt was recommended for CIR and pt was admitted on 9/7.  Early morning 9/16, pt developed persistent afib with RVR and was transferred back to the acute setting for further medical management.  She underwent DCCV on 9/19 and was deemed stable to return to CIR on 9/20.  Therapy evaluations completed on 9/20 and recommended CIR.     Patient's medical record from [facility] has been reviewed by the rehabilitation admission coordinator and physician.     Past Medical History      Past Medical History:  Diagnosis Date   Abnormal uterine bleeding      on HRT   Allergy      seasonal   Arthritis     Atrial fibrillation (HCC)     Celiac disease     DDD (degenerative disc disease)     Dysrhythmia     Glaucoma     Microscopic colitis     Osteoarthritis of both knees     PONV (postoperative nausea and vomiting)     SVT (supraventricular tachycardia) (HCC)        Has the patient had major surgery during 100 days prior to admission? Yes   Family History   family history includes Bipolar disorder in her son; Breast cancer in her cousin; Heart disease (age of onset: 67) in her brother; Liver disease in her son; Osteoarthritis in her maternal grandmother; Osteoporosis in her mother; Prostate cancer in her father; Stroke in her mother.    Current Medications   Current Facility-Administered Medications:    acetaminophen (TYLENOL) tablet 650 mg, 650 mg, Oral, Q6H PRN, Nahser, Philip J, MD, 650 mg at 03/13/21 0830   amiodarone (PACERONE) tablet 400 mg, 400 mg, Oral, Daily, Ursuy, Renee Lynn, PA-C, 400 mg at 03/20/21 0847   apixaban (ELIQUIS) tablet 5 mg, 5 mg, Oral, BID, Nahser, Philip J, MD, 5 mg at 03/20/21 0846   bethanechol (URECHOLINE) tablet 20 mg, 20 mg, Oral, TID, Nahser, Philip J, MD, 20 mg at 03/20/21 0847   cyclobenzaprine (FLEXERIL) tablet 5 mg, 5 mg, Oral, BID, Nahser, Philip J, MD, 5 mg at 03/20/21 0847   feeding supplement (BOOST / RESOURCE BREEZE) liquid 1 Container, 1 Container, Oral, BID BM, Nahser, Philip J, MD, 1 Container at 03/13/21 1417   gabapentin (NEURONTIN) capsule 400 mg, 400 mg, Oral, QHS, Nahser, Philip J, MD, 400 mg at 03/19/21 2209   latanoprost (XALATAN) 0.005 % ophthalmic solution 1 drop, 1 drop, Left Eye, QHS, Nahser, Philip J, MD, 1 drop at 03/19/21 2210   oxyCODONE (Oxy IR/ROXICODONE) immediate release tablet 2.5-5 mg, 2.5-5 mg, Oral, Q3H PRN, Nahser, Philip J, MD, 5 mg at 03/19/21 2209   pantoprazole (PROTONIX) EC tablet 40 mg, 40 mg, Oral, Daily, Nahser, Philip J, MD, 40 mg at 03/20/21 0847   polyethylene glycol (MIRALAX / GLYCOLAX) packet 17 g, 17 g, Oral, Daily, Nahser, Philip J, MD, 17 g at 03/14/21 0834   polyvinyl alcohol (LIQUIFILM TEARS) 1.4 % ophthalmic solution 1 drop, 1 drop, Both Eyes, PRN, Nahser, Philip J, MD   potassium chloride (KLOR-CON) packet 60 mEq, 60 mEq, Oral, Daily, Nahser, Philip J, MD, 60 mEq at 03/20/21 0848   senna-docusate (Senokot-S) tablet 1 tablet, 1 tablet, Oral, QHS PRN, Nahser, Philip J, MD   timolol (TIMOPTIC) 0.5 % ophthalmic solution 1 drop, 1 drop, Left Eye, BID, Nahser, Philip J, MD, 1 drop at 03/20/21 0848   traZODone (DESYREL) tablet 50 mg, 50 mg, Oral, QHS PRN, Nahser, Philip J, MD   Patients Current Diet:  Diet Order                  Diet regular  Room service appropriate? Yes; Fluid consistency: Thin  Diet effective now                         Precautions / Restrictions Precautions Precautions: Fall, Other (comment) Precaution Comments: bladder/bowel incontinence Other Brace: bilateral ankle air splints, PRAFOs   Restrictions Weight Bearing Restrictions: No    Has the patient had 2 or more falls or a fall with injury in the past year? No   Prior Activity Level   Prior Functional Level Self Care: Did the patient need help bathing, dressing, using the toilet or eating? Independent   Indoor Mobility: Did the patient need assistance with walking from room to room (with or without device)? Independent   Stairs: Did the patient need assistance with internal or external stairs (with or without device)? Independent   Functional Cognition: Did the patient need help planning regular tasks such as shopping or remembering to take medications? Independent   Patient Information Are you of Hispanic, Latino/a,or Spanish origin?: A. No, not of Hispanic, Latino/a, or Spanish origin What is your race?: A. White Do you need or want an interpreter to communicate with a doctor or health care staff?: 0. No   Patient's Response To:  Health Literacy and Transportation Is the patient able to respond to health literacy and transportation needs?: Yes Health Literacy - How often do you need to have someone help you when you read instructions, pamphlets, or other written material from your doctor or pharmacy?: Never In the past 12 months, has lack of transportation kept you from medical appointments or from getting medications?: No In the past 12 months, has lack of transportation kept you from meetings, work, or from getting things needed for daily living?: No   Home Assistive Devices / Equipment Home Assistive Devices/Equipment: None Home Equipment: None   Prior Device Use: Indicate devices/aids used by the patient prior to current illness,  exacerbation or injury? None of the above   Current Functional Level Cognition   Overall Cognitive Status: Within Functional Limits for tasks assessed Orientation Level: Oriented X4 General Comments: pt able to follow all commands and demo good safety awareness and recall of techniques    Extremity Assessment (includes Sensation/Coordination)   Upper Extremity Assessment: Generalized weakness RUE Deficits / Details: 3/5 grossly strength throughout. Poor pinch strength with numbness and tingling RUE Sensation: decreased light touch, decreased proprioception RUE Coordination: decreased fine motor, decreased gross motor LUE Deficits / Details: 3/5 strength throughout. Poor pinch strength and numbness and tingling LUE Sensation: decreased light touch, decreased proprioception LUE Coordination: decreased fine motor, decreased gross motor  Lower Extremity Assessment: Defer to PT evaluation RLE Deficits / Details: 2/5 for ankle DF and PF, 2-/5 for ankle eversion, 3/5 knee flexion and extension, is able to complete against gravity with max encouragement and effort RLE Sensation: decreased light touch, decreased proprioception RLE Coordination: decreased fine motor, decreased gross motor LLE Deficits / Details: 2/5 for ankle DF and PF, 2-/5 for ankle eversion, 2/5 knee flexion and extension LLE Sensation: decreased light touch, decreased proprioception LLE Coordination: decreased fine motor, decreased gross motor     ADLs   Overall ADL's : Needs assistance/impaired Eating/Feeding: Set up, With adaptive utensils Grooming: Set up, Sitting, Brushing hair Grooming Details (indicate cue type and reason): assist to open toothpaste tube (remove cap) but able to place toothpaste on toothbrush without difficulty. Did request assist to brush back of hair for thoroughness but pt likely able to do this Upper Body Bathing: Bed level, Minimal assistance Lower Body Bathing: Maximal assistance, Bed  level Lower Body Bathing Details (indicate cue type and reason): to bathe peri region after purewick malfunction. able to assist well in rolling Upper Body Dressing : Minimal assistance, Bed level Lower Body Dressing: Maximal assistance, Bed level Lower Body   Dressing Details (indicate cue type and reason): for ankle splints and tennis shoes. Guided pt in lateral leans sitting EOB and reaching to bottom to simulate LB dressing techniques Toilet Transfer: Moderate assistance, +2 for physical assistance, Transfer board Toileting- Clothing Manipulation and Hygiene: Total assistance, Bed level Functional mobility during ADLs: +2 for physical assistance, Moderate assistance General ADL Comments: Session focused on UE HEP education with handout provided, lateral leans and balance sitting EOB and sliding board transfer to wheelchair. Provided handout for theraputty exercises as well.     Mobility   Overal bed mobility: Needs Assistance Bed Mobility: Rolling, Sidelying to Sit, Sit to Sidelying Rolling: Min assist Sidelying to sit: Min assist Sit to supine: Mod assist Sit to sidelying: Mod assist General bed mobility comments: minA to BLE to move to EOB, modA to return to bed. pt with good use of BUE to elevate trunk     Transfers   Overall transfer level: Needs assistance Equipment used: None Transfers: Lateral/Scoot Transfers Sit to Stand: Max assist, +2 physical assistance  Lateral/Scoot Transfers: Mod assist General transfer comment: modA to scoot along EOB, only completed x1 due to pain and inability to position BLE in neutral to wt bear     Ambulation / Gait / Stairs / Wheelchair Mobility   Ambulation/Gait General Gait Details: pt unable     Posture / Balance Dynamic Sitting Balance Sitting balance - Comments: pt able to improve to static and dynamic sitting without UE support, can lean outside midline and recover without use of UE Balance Overall balance assessment: Needs  assistance Sitting-balance support: Feet supported Sitting balance-Leahy Scale: Fair Sitting balance - Comments: pt able to improve to static and dynamic sitting without UE support, can lean outside midline and recover without use of UE Postural control: Posterior lean Standing balance support: Bilateral upper extremity supported Standing balance-Leahy Scale: Zero     Special needs/care consideration Diabetic management yes    Previous Home Environment (from acute therapy documentation) Living Arrangements: Spouse/significant other, Children  Lives With: Spouse Available Help at Discharge: Family, Available PRN/intermittently Type of Home: House Home Layout: Multi-level, Able to live on main level with bedroom/bathroom, Full bath on main level Home Access: Stairs to enter Entrance Stairs-Rails: Left Entrance Stairs-Number of Steps: 3-4 Bathroom Shower/Tub: Tub/shower unit, Walk-in shower Home Care Services: No Additional Comments: Lives with husband and son; husband works   Discharge Living Setting Plans for Discharge Living Setting: Patient's home, Lives with (comment) (spouse and adult son) Type of Home at Discharge: House Discharge Home Layout: Able to live on main level with bedroom/bathroom Discharge Home Access: Stairs to enter Entrance Stairs-Rails: Left Entrance Stairs-Number of Steps: 3-4 Discharge Bathroom Shower/Tub: Tub/shower unit, Walk-in shower Discharge Bathroom Toilet: Standard Discharge Bathroom Accessibility: Yes How Accessible: Accessible via walker, Accessible via wheelchair Does the patient have any problems obtaining your medications?: No   Social/Family/Support Systems Patient Roles: Spouse Anticipated Caregiver: Harry Guadiana Caregiver Availability: 24/7 Discharge Plan Discussed with Primary Caregiver: Yes Is Caregiver In Agreement with Plan?: Yes Does Caregiver/Family have Issues with Lodging/Transportation while Pt is in Rehab?: No    Goals Patient/Family Goal for Rehab: PT/OT supervision to min assist assist w/c level, SLP n/a Expected length of stay: 18-21 Pt/Family Agrees to Admission and willing to participate: Yes Program Orientation Provided & Reviewed with Pt/Caregiver Including Roles  & Responsibilities: Yes  Barriers to Discharge: Insurance for SNF coverage, Home environment access/layout   Decrease burden of Care through IP rehab admission: n/a     Possible need for SNF placement upon discharge: Not antipcated   Patient Condition: I have reviewed medical records from [facility], spoken with CM, and patient and spouse. I met with patient at the bedside and discussed via phone for inpatient rehabilitation assessment.  Patient will benefit from ongoing PT, OT, and SLP, can actively participate in 3 hours of therapy a day 5 days of the week, and can make measurable gains during the admission.  Patient will also benefit from the coordinated team approach during an Inpatient Acute Rehabilitation admission.  The patient will receive intensive therapy as well as Rehabilitation physician, nursing, social worker, and care management interventions.  Due to bladder management, bowel management, safety, skin/wound care, disease management, medication administration, pain management, and patient education the patient requires 24 hour a day rehabilitation nursing.  The patient is currently max +2 with mobility and basic ADLs.  Discharge setting and therapy post discharge at home with home health is anticipated.  Patient has agreed to participate in the Acute Inpatient Rehabilitation Program and will admit today.   Preadmission Screen Completed By:  Sahithi Ordoyne E Kalim Kissel, PT, DPT 03/20/2021 10:33 AM ______________________________________________________________________   Discussed status with Dr. Patel on 10:33 AM  at 03/20/21 and received approval for admission today.   Admission Coordinator:  Teniyah Seivert E Marquerite Forsman, PT, DPT time 10:33 AM /Date  03/20/21    Assessment/Plan: Diagnosis: Guillain Barre Syndrome Does the need for close, 24 hr/day Medical supervision in concert with the patient's rehab needs make it unreasonable for this patient to be served in a less intensive setting? Yes Co-Morbidities requiring supervision/potential complications: afib, celiac disease  Due to bladder management, bowel management, safety, disease management, and patient education, does the patient require 24 hr/day rehab nursing? Yes Does the patient require coordinated care of a physician, rehab nurse, PT, OT to address physical and functional deficits in the context of the above medical diagnosis(es)? Yes Addressing deficits in the following areas: balance, endurance, locomotion, strength, transferring, bathing, dressing, toileting, and psychosocial support Can the patient actively participate in an intensive therapy program of at least 3 hrs of therapy 5 days a week? Yes The potential for patient to make measurable gains while on inpatient rehab is excellent Anticipated functional outcomes upon discharge from inpatient rehab: min assist PT, min assist OT, n/a SLP  Estimated rehab length of stay to reach the above functional goals is: 14-17 days. Anticipated discharge destination: Home 10. Overall Rehab/Functional Prognosis: good   MD Signature: Ankit Patel, MD, ABPMR 

## 2021-03-20 NOTE — Discharge Summary (Signed)
Physician Discharge Summary  Angela Morales BZJ:[id number] DOB: 10-Jan-1946 DOA: 03/13/2021  PCP: Deland Pretty, MD  Admit date: 03/13/2021 Discharge date: 03/20/2021  Admitted From: CIR Disposition:  CIR  Recommendations for Outpatient Follow-up:  Follow up with PCP in 1-2 weeks Follow-up cardiology Follow-up with neurology Please obtain BMP/CBC in one week Please follow up on the following pending results:None  Home Health: No Equipment/Devices: Wheelchair Discharge Condition: Stable CODE STATUS: Full Diet recommendation:  Regular   Brief/Interim Summary: Angela Morales 75 year old female with atrial fibrillation status post ablation, SVT, lumbar scoliosis, celiac disease who recently was hospitalized for Guillain-Barr syndrome.  Hospitalization was complicated neuromuscular respiratory failure requiring intubation, septic shock, staph aureus pneumonia (right lower lobe), tracheostomy, hyponatremia and A. fib with RVR.  The patient was subsequently discharged to [facility] inpatient rehab be treated for severe deconditioning. While in CIR, the patient developed persistent tachycardia and EKG revealed atrial flutter.  [facility] was contacted and patient was readmitted to [facility].  She was started on an Amiodarone infusion and given 1 L fluid bolus for hypotension. Cardiology was consulted-patient underwent DCCV on 03/16/2021 with restoration of normal sinus rhythm.  Amiodarone was converted to p.o. dose was increased to 400 mg daily by cardiology. Patient remained in sinus rhythm since then and stable to go back to CIR to complete her therapy. Her lower extremities started improving and she was able to lift her legs off the bed now.  She will get great benefit from continuation of therapy at CIR.  Patient will continue with rest of her home medications.  Discharge Diagnoses:  Principal Problem:   Atrial flutter with rapid ventricular response (HCC) Active  Problems:   GBS (Guillain Barre syndrome) (HCC)   Hyponatremia   Chronic pain syndrome   Urinary retention   Discharge Instructions  Discharge Instructions     Diet - low sodium heart healthy   Complete by: As directed    Increase activity slowly   Complete by: As directed       Allergies as of 03/20/2021       Reactions   Gluten Meal Other (See Comments)   celiac disease   Cefdinir Rash        Medication List     STOP taking these medications    docusate 50 MG/5ML liquid Commonly known as: COLACE   feeding supplement (OSMOLITE 1.5 CAL) Liqd   feeding supplement (PROSource TF) liquid   gabapentin 250 MG/5ML solution Commonly known as: NEURONTIN Replaced by: gabapentin 400 MG capsule   QUEtiapine 25 MG tablet Commonly known as: SEROQUEL       TAKE these medications    acetaminophen 160 MG/5ML solution Commonly known as: TYLENOL Take 20.3 mLs (650 mg total) by mouth every 6 (six) hours as needed for mild pain, moderate pain or fever.   albuterol (2.5 MG/3ML) 0.083% nebulizer solution Commonly known as: PROVENTIL Take 3 mLs (2.5 mg total) by nebulization every 4 (four) hours as needed for wheezing or shortness of breath.   amiodarone 400 MG tablet Commonly known as: PACERONE Take 1 tablet (400 mg total) by mouth daily. Start taking on: March 21, 2021 What changed:  medication strength how much to take   apixaban 5 MG Tabs tablet Commonly known as: ELIQUIS Take 1 tablet (5 mg total) by mouth 2 (two) times daily.   bethanechol 10 MG tablet Commonly known as: URECHOLINE Take 2 tablets (20 mg total) by mouth 3 (three) times daily.   chlorhexidine  0.12 % solution Commonly known as: PERIDEX 15 mLs by Mouth Rinse route 2 (two) times daily.   Chlorhexidine Gluconate Cloth 2 % Pads Apply 6 each topically daily.   cyclobenzaprine 5 MG tablet Commonly known as: FLEXERIL Take 1 tablet (5 mg total) by mouth 2 (two) times daily as needed for  muscle spasms.   gabapentin 400 MG capsule Commonly known as: NEURONTIN Take 1 capsule (400 mg total) by mouth at bedtime. Replaces: gabapentin 250 MG/5ML solution   Gerhardt's butt cream Crea Apply 1 application topically as needed for irritation.   guaiFENesin 100 MG/5ML Soln Commonly known as: ROBITUSSIN Take 15 mLs (300 mg total) by mouth every 6 (six) hours as needed for cough or to loosen phlegm.   latanoprost 0.005 % ophthalmic solution Commonly known as: XALATAN Place 1 drop into the left eye at bedtime.   lip balm Oint Apply 1 application topically as needed for lip care.   melatonin 3 MG Tabs tablet Take 1 tablet (3 mg total) by mouth at bedtime.   oxyCODONE 5 MG immediate release tablet Commonly known as: Oxy IR/ROXICODONE Take 0.5 tablets (2.5 mg total) by mouth every 3 (three) hours as needed for moderate pain.   pantoprazole 40 MG tablet Commonly known as: PROTONIX Take 1 tablet (40 mg total) by mouth daily.   polyethylene glycol 17 g packet Commonly known as: MIRALAX / GLYCOLAX Take 17 g by mouth daily.   polyvinyl alcohol 1.4 % ophthalmic solution Commonly known as: LIQUIFILM TEARS Place 1 drop into both eyes as needed for dry eyes.   potassium chloride 20 MEQ packet Commonly known as: KLOR-CON Take 60 mEq by mouth daily.   senna-docusate 8.6-50 MG tablet Commonly known as: Senokot-S Take 1 tablet by mouth at bedtime as needed for mild constipation.   timolol 0.5 % ophthalmic solution Commonly known as: TIMOPTIC Place 1 drop into the left eye 2 (two) times daily.   Zioptan 0.0015 % Soln Generic drug: Tafluprost (PF) Place 1 drop into the left eye at bedtime.        Follow-up Information     Constance Haw, MD Follow up.   Specialty: Cardiology Why: 04/28/21 @ 10:30AM Contact information: [address] [place] [address] [phone]         Deland Pretty, MD. Schedule an appointment as soon as possible for a  visit in 1 week(s).   Specialty: Internal Medicine Contact information: [address] [place] [address] [phone]         Sanda Klein, MD .   Specialty: Cardiology Contact information: [address] [place] [address] [place] [address] [phone]                Allergies  Allergen Reactions   Gluten Meal Other (See Comments)    celiac disease   Cefdinir Rash    Consultations: Cardiology EP  Procedures/Studies: DCCV  DG Chest Port 1 View  Result Date: 02/26/2021 CLINICAL DATA:  Shortness of breath, tracheostomy EXAM: PORTABLE CHEST 1 VIEW COMPARISON:  Radiograph 02/18/2021, chest CT 02/06/2021 FINDINGS: Tracheostomy tube overlies the midthoracic trachea. There is a feeding tube which passes below the diaphragm, tip excluded by collimation. There are bilateral airspace opacities, slightly improved from prior exam. There is no large pleural effusion or visible pneumothorax. Bones are unchanged. IMPRESSION: Persistent bilateral airspace disease in the mid to lower lungs, slightly improved in comparison to prior exam. Electronically Signed   By: Ileene Patrick.D.  On: 02/26/2021 09:11   DG Swallowing Func-Speech Pathology  Result Date: 02/27/2021 Table formatting from the original result was not included. Objective Swallowing Evaluation: Type of Study: MBS-Modified Barium Swallow Study  Patient Details Name: JONIKA CRITZ MRN: [id number] Date of Birth: 1945-11-21 Today's Date: 02/27/2021 Time: SLP Start Time (ACUTE ONLY): 98 -SLP Stop Time (ACUTE ONLY): 1028 SLP Time Calculation (min) (ACUTE ONLY): 22 min Past Medical History: Past Medical History: Diagnosis Date  Abnormal uterine bleeding   on HRT  Allergy   seasonal  Arthritis   Atrial fibrillation (HCC)   Celiac disease   DDD (degenerative disc disease)   Dysrhythmia   Glaucoma   Microscopic colitis   Osteoarthritis of both knees   PONV (postoperative nausea and vomiting)   SVT  (supraventricular tachycardia) ([place])  Past Surgical History: Past Surgical History: Procedure Laterality Date  APPENDECTOMY    ATRIAL FIBRILLATION ABLATION N/A 01/28/2021  Procedure: ATRIAL FIBRILLATION ABLATION;  Surgeon: Constance Haw, MD;  Location: [place] CV LAB;  Service: Cardiovascular;  Laterality: N/A;  CARDIOVERSION N/A 10/30/2020  Procedure: CARDIOVERSION;  Surgeon: Jerline Pain, MD;  Location: [facility] ENDOSCOPY;  Service: Cardiovascular;  Laterality: N/A;  COLONOSCOPY    FOOT FRACTURE SURGERY  10/2013  TONSILLECTOMY AND ADENOIDECTOMY    TOTAL KNEE ARTHROPLASTY Left 03/22/2016  Procedure: TOTAL KNEE ARTHROPLASTY;  Surgeon: Dorna Leitz, MD;  Location: [place];  Service: Orthopedics;  Laterality: Left;  TUBAL LIGATION  1981  VAGINAL HYSTERECTOMY  04/20/05  prolapse, adenomyosis HPI: 75 y.o. female admitted 02/05/21 with c/o progressive BUE/BLE numbness and weakness; diagnosed with Guillain-Barr syndrome. Pt also with afib with RVR. Intubated 8/14 due to Acute respiratory failure with hypoxia w/ aspiration PNA and bibasilar atx.Marland Kitchen MRI negative 8/15. Trach placed 8/16. CT brain 8/16 negative.  PMH includes afib (recent ablation 01/28/21), celiac disease, DDD, gluacoma.  Subjective: alert, pleasant with PMSV in place Assessment / Plan / Recommendation CHL IP CLINICAL IMPRESSIONS 02/27/2021 Clinical Impression Pt demonstrates no oral or oropharyngeal dysphagia. No pharyngeal weakness identified. Barium tablet did lodge in valleculae and then esophagus, suspect removal of cortrak will resolve this.Test conducted with PMSV in place and off. Did not test regular as pt is gluten sensitive and no gluten free options available. However, oral phase WNL otherwise and pharyngeal strength WNL. Recommmend pt initiate a gluten free diet. Would benefit from soft choices af first, but cannot also order gluten free diet. Pt expected to make appropriate choices as cognition WNL. Will f/u for tolerance briefly with PMSV checks of  tolerance and independence.   SLP Visit Diagnosis Dysphagia, unspecified (R13.10) Attention and concentration deficit following -- Frontal lobe and executive function deficit following -- Impact on safety and function Mild aspiration risk   CHL IP TREATMENT RECOMMENDATION 02/27/2021 Treatment Recommendations Therapy as outlined in treatment plan below   Prognosis 02/27/2021 Prognosis for Safe Diet Advancement Good Barriers to Reach Goals -- Barriers/Prognosis Comment -- CHL IP DIET RECOMMENDATION 02/27/2021 SLP Diet Recommendations Regular solids;Thin liquid Liquid Administration via Cup;Straw Medication Administration Whole meds with liquid Compensations Slow rate;Small sips/bites Postural Changes Remain semi-upright after after feeds/meals (Comment);Seated upright at 90 degrees   CHL IP OTHER RECOMMENDATIONS 02/27/2021 Recommended Consults -- Oral Care Recommendations Oral care BID Other Recommendations --   CHL IP FOLLOW UP RECOMMENDATIONS 02/27/2021 Follow up Recommendations Inpatient Rehab   CHL IP FREQUENCY AND DURATION 02/27/2021 Speech Therapy Frequency (ACUTE ONLY) min 2x/week Treatment Duration 2 weeks      CHL IP ORAL PHASE 02/27/2021  Oral Phase WFL Oral - Pudding Teaspoon -- Oral - Pudding Cup -- Oral - Honey Teaspoon -- Oral - Honey Cup -- Oral - Nectar Teaspoon -- Oral - Nectar Cup -- Oral - Nectar Straw -- Oral - Thin Teaspoon -- Oral - Thin Cup -- Oral - Thin Straw -- Oral - Puree -- Oral - Mech Soft -- Oral - Regular -- Oral - Multi-Consistency -- Oral - Pill -- Oral Phase - Comment --  CHL IP PHARYNGEAL PHASE 02/27/2021 Pharyngeal Phase WFL Pharyngeal- Pudding Teaspoon -- Pharyngeal -- Pharyngeal- Pudding Cup -- Pharyngeal -- Pharyngeal- Honey Teaspoon -- Pharyngeal -- Pharyngeal- Honey Cup -- Pharyngeal -- Pharyngeal- Nectar Teaspoon -- Pharyngeal -- Pharyngeal- Nectar Cup -- Pharyngeal -- Pharyngeal- Nectar Straw -- Pharyngeal -- Pharyngeal- Thin Teaspoon -- Pharyngeal -- Pharyngeal- Thin Cup -- Pharyngeal --  Pharyngeal- Thin Straw -- Pharyngeal -- Pharyngeal- Puree -- Pharyngeal -- Pharyngeal- Mechanical Soft -- Pharyngeal -- Pharyngeal- Regular -- Pharyngeal -- Pharyngeal- Multi-consistency -- Pharyngeal -- Pharyngeal- Pill -- Pharyngeal -- Pharyngeal Comment --  No flowsheet data found. DeBlois, Katherene Ponto 02/27/2021, 11:13 AM               Subjective: Patient was seen.  No new complaints, legs continue to improve.  Discharge Exam: Vitals:   03/20/21 0349 03/20/21 0837  BP: 122/70 116/66  Pulse: 76 83  Resp: 19 12  Temp: 98.4 F (36.9 C) 97.9 F (36.6 C)  SpO2: 99% 100%   Vitals:   03/19/21 2001 03/19/21 2320 03/20/21 0349 03/20/21 0837  BP: 122/64 132/74 122/70 116/66  Pulse: 80 80 76 83  Resp: 20 20 19 12   Temp: 98.4 F (36.9 C) 98.4 F (36.9 C) 98.4 F (36.9 C) 97.9 F (36.6 C)  TempSrc: Oral Oral Oral Oral  SpO2: 99% 99% 99% 100%  Weight:      Height:        General: Pt is alert, awake, not in acute distress Cardiovascular: RRR, S1/S2 +, no rubs, no gallops Respiratory: CTA bilaterally, no wheezing, no rhonchi Abdominal: Soft, NT, ND, bowel sounds + Extremities: no edema, no cyanosis   The results of significant diagnostics from this hospitalization (including imaging, microbiology, ancillary and laboratory) are listed below for reference.    Microbiology: No results found for this or any previous visit (from the past 240 hour(s)).   Labs: BNP (last 3 results) Recent Labs    02/18/21 0045  BNP 053.9*   Basic Metabolic Panel: Recent Labs  Lab 03/13/21 1220 03/14/21 0131 03/16/21 0141 03/19/21 0124  NA 134* 134* 131* 132*  K 4.3 3.5 3.8 3.7  CL 101 100 97* 99  CO2 26 25 26 26   GLUCOSE 99 94 90 98  BUN 5* 5* 5* 6*  CREATININE 0.45 0.41* 0.46 0.41*  CALCIUM 8.3* 8.4* 8.1* 8.6*   Liver Function Tests: No results for input(s): AST, ALT, ALKPHOS, BILITOT, PROT, ALBUMIN in the last 168 hours. No results for input(s): LIPASE, AMYLASE in the last 168  hours. No results for input(s): AMMONIA in the last 168 hours. CBC: Recent Labs  Lab 03/13/21 1220 03/14/21 0131  WBC 5.7 6.4  HGB 12.0 12.3  HCT 36.1 38.2  MCV 98.1 98.5  PLT 201 210   Cardiac Enzymes: No results for input(s): CKTOTAL, CKMB, CKMBINDEX, TROPONINI in the last 168 hours. BNP: Invalid input(s): POCBNP CBG: No results for input(s): GLUCAP in the last 168 hours. D-Dimer No results for input(s): DDIMER in the last 72 hours. Hgb A1c  No results for input(s): HGBA1C in the last 72 hours. Lipid Profile No results for input(s): CHOL, HDL, LDLCALC, TRIG, CHOLHDL, LDLDIRECT in the last 72 hours. Thyroid function studies No results for input(s): TSH, T4TOTAL, T3FREE, THYROIDAB in the last 72 hours.  Invalid input(s): FREET3 Anemia work up No results for input(s): VITAMINB12, FOLATE, FERRITIN, TIBC, IRON, RETICCTPCT in the last 72 hours. Urinalysis    Component Value Date/Time   COLORURINE YELLOW 02/06/2021 1107   APPEARANCEUR CLEAR 02/06/2021 1107   LABSPEC 1.043 (H) 02/06/2021 1107   PHURINE 6.0 02/06/2021 1107   GLUCOSEU 50 (A) 02/06/2021 1107   HGBUR NEGATIVE 02/06/2021 1107   BILIRUBINUR NEGATIVE 02/06/2021 1107   KETONESUR 20 (A) 02/06/2021 1107   PROTEINUR NEGATIVE 02/06/2021 1107   UROBILINOGEN 0.2 12/01/2013 1339   NITRITE NEGATIVE 02/06/2021 1107   LEUKOCYTESUR NEGATIVE 02/06/2021 1107   Sepsis Labs Invalid input(s): PROCALCITONIN,  WBC,  LACTICIDVEN Microbiology No results found for this or any previous visit (from the past 240 hour(s)).  Time coordinating discharge: Over 30 minutes  SIGNED:  Lorella Nimrod, MD  [facility] 03/20/2021, 10:47 AM  If 7PM-7AM, please contact night-coverage [email or web address]  This record has been created using Systems analyst. Errors have been sought and corrected,but may not always be located. Such creation errors do not reflect on the standard of care.

## 2021-03-20 NOTE — Progress Notes (Signed)
Report called to 4MW nurse.

## 2021-03-20 NOTE — Evaluation (Signed)
Occupational Therapy Assessment and Plan  Patient Details  Name: Angela Morales MRN: [id number] Date of Birth: [birth year]  OT Diagnosis: abnormal posture, muscle weakness (generalized), and paraparesis and impaired sensation Rehab Potential: Rehab Potential (ACUTE ONLY): Good ELOS: 2 to 2.5 weeks   Today's Date: 03/21/2021 OT Individual Time: 1000-1058 and 1445-1525 OT Individual Time Calculation (min): 58 min  and 40 min  Hospital Problem: Principal Problem:   GBS (Guillain-Barre syndrome) ([place])   Past Medical History:  Past Medical History:  Diagnosis Date   Abnormal uterine bleeding    on HRT   Allergy    seasonal   Arthritis    Atrial fibrillation (HCC)    Celiac disease    DDD (degenerative disc disease)    Dysrhythmia    Glaucoma    Microscopic colitis    Osteoarthritis of both knees    PONV (postoperative nausea and vomiting)    SVT (supraventricular tachycardia) ([place])    Past Surgical History:  Past Surgical History:  Procedure Laterality Date   APPENDECTOMY     ATRIAL FIBRILLATION ABLATION N/A 01/28/2021   Procedure: ATRIAL FIBRILLATION ABLATION;  Surgeon: Constance Haw, MD;  Location: [facility];  Service: Cardiovascular;  Laterality: N/A;   CARDIOVERSION N/A 10/30/2020   Procedure: CARDIOVERSION;  Surgeon: Jerline Pain, MD;  Location: [facility] ENDOSCOPY;  Service: Cardiovascular;  Laterality: N/A;   CARDIOVERSION N/A 03/16/2021   Procedure: CARDIOVERSION;  Surgeon: Thayer Headings, MD;  Location: [facility] ENDOSCOPY;  Service: Cardiovascular;  Laterality: N/A;   COLONOSCOPY     FOOT FRACTURE SURGERY  10/2013   TONSILLECTOMY AND ADENOIDECTOMY     TOTAL KNEE ARTHROPLASTY Left 03/22/2016   Procedure: TOTAL KNEE ARTHROPLASTY;  Surgeon: Dorna Leitz, MD;  Location: [facility];  Service: Orthopedics;  Laterality: Left;   TUBAL LIGATION  1981   VAGINAL HYSTERECTOMY  04/20/05   prolapse, adenomyosis    Assessment & Plan Clinical Impression:  Angela Morales. 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 She was treated with IVIG but continued to worsen with aspiration event requiring intubation for hypoxic hypercarbic respiratory failure. She was treated for aspiration pneumonia, but hospital course significant for A fib with RVR, progressive hyponatremia, early acute cholecystitis as well as hypotension requiring pressures. She required tracheostomy prior to wean to ATC and ultimately decannulated by 09/06.  She continued to have significant impairments in mobility and ADLs and was admitted to CIR on 03/04/2021. She was making gains but developed A. flutter with hypotension on 03/13/2021 requiring discharge to acute hospital for telemetry/management. She was started on amiodarone drip but continued to have issues with A flutter. She  underwent DC cardioversion on by Dr. Acie Fredrickson on 03/16/2021 and remains in NSR. Therapy resumed and she continues to be limited by bilateral lower extremity weakness, increased tone, balance and postural deficits. CIR recommended due to functional decline.   Please see preadmission assessment from earlier today as well.  Patient currently requires total with basic self-care skills secondary to muscle weakness and muscle paralysis, decreased cardiorespiratoy endurance, abnormal tone, unbalanced muscle activation, and decreased coordination, and decreased sitting balance, decreased standing balance, decreased postural control, and decreased balance strategies.  Prior to hospitalization, patient could complete BADLs with independent .  Patient will benefit from skilled intervention to increase independence with  basic self-care skills prior to discharge home with care partner.  Anticipate patient will require 24 hour supervision and minimal physical assistance and follow  up home health.  OT - End of Session Endurance Deficit: Yes OT Assessment Rehab Potential (ACUTE ONLY): Good OT Barriers to Discharge: Incontinence OT Patient demonstrates impairments in the following area(s): Balance;Endurance;Motor;Pain;Safety;Sensory OT Basic ADL's Functional Problem(s): Grooming;Bathing;Dressing;Toileting OT Advanced ADL's Functional Problem(s): Simple Meal Preparation OT Transfers Functional Problem(s): Toilet;Tub/Shower OT Additional Impairment(s): None OT Plan OT Intensity: Minimum of 1-2 x/day, 45 to 90 minutes OT Frequency: 5 out of 7 days OT Duration/Estimated Length of Stay: 2 to 2.5 weeks OT Treatment/Interventions: Balance/vestibular training;Discharge planning;Pain management;Self Care/advanced ADL retraining;Therapeutic Activities;UE/LE Coordination activities;Visual/perceptual remediation/compensation;Therapeutic Exercise;Functional mobility training;Patient/family education;Skin care/wound managment;Disease mangement/prevention;Community reintegration;DME/adaptive equipment instruction;Neuromuscular re-education;Psychosocial support;Splinting/orthotics;UE/LE Strength taining/ROM;Wheelchair propulsion/positioning OT Self Feeding Anticipated Outcome(s): No goal OT Basic Self-Care Anticipated Outcome(s): Min A OT Toileting Anticipated Outcome(s): Min A OT Bathroom Transfers Anticipated Outcome(s): Min A OT Recommendation Recommendations for Other Services: Neuropsych consult Patient destination: Home Follow Up Recommendations: Home health OT Equipment Recommended: To be determined   OT Evaluation Precautions/Restrictions  Precautions Precautions: Fall Required Braces or Orthoses: Other Brace Other Brace: bilateral ankle air splints, PRAFOs Restrictions Weight Bearing Restrictions: No  Pain: pain in hips when in bed, needed assistance with repositioning to absolve pain   Home Living/Prior Functioning Home Living Family/patient expects to be  discharged to:: Private residence Living Arrangements: Spouse/significant other, son Available Help at Discharge: Family, Available 24/7 Type of Home: House Home Access: Stairs to enter Technical brewer of Steps: 3-4 Entrance Stairs-Rails: Left Home Layout: Multi-level, Able to live on main level with bedroom/bathroom, Full bath on main level Bathroom Shower/Tub: Tub/shower unit, Multimedia programmer: Standard Bathroom Accessibility: Yes, wheelchair accessible Additional Comments: Lives with husband and son; husband works  Lives With: Spouse, Son IADL History Occupation: Retired Type of Occupation: Designer, jewellery Leisure and Hobbies: sewing, cooking, reading, cleaning Prior Function Level of Independence: Independent with basic ADLs, Independent with homemaking with ambulation  Able to Take Stairs?: Yes Driving: Yes Vocation: Retired Comments: Per chart: Independent without DME; retired Press photographer; drives; enjoys babysitting grandkids (53 and 33 y.o.), enjoys time with cat and dog, watching tv Vision Baseline Vision/History: 1 Wears glasses Ability to See in Adequate Light: 0 Adequate Patient Visual Report: No change from baseline Vision Assessment?: No apparent visual deficits Perception  Perception: Within Functional Limits Praxis Praxis: Intact Cognition Overall Cognitive Status: Within Functional Limits for tasks assessed Arousal/Alertness: Awake/alert Orientation Level: Person;Place;Situation Person: Oriented Place: Oriented Situation: Oriented Year: 2022 Month: September Day of Week: Correct Memory: Appears intact Immediate Memory Recall: Sock;Blue;Bed Memory Recall Sock: Without Cue Memory Recall Blue: Without Cue Memory Recall Bed: Without Cue Attention: Sustained;Selective Focused Attention: Appears intact Sustained Attention: Appears intact Selective Attention: Appears intact Awareness: Appears intact Problem  Solving: Appears intact Safety/Judgment: Appears intact Sensation Sensation Light Touch: Impaired Detail Light Touch Impaired Details: Impaired RLE;Impaired LLE;Impaired RUE;Impaired LUE (numbness in fingertips bilaterally, decreased appreciation to light touch in LEs but able to localize with testing excluding dorsal aspects of toes on the Rt foot) Coordination Gross Motor Movements are Fluid and Coordinated: No Fine Motor Movements are Fluid and Coordinated: No Coordination and Movement Description: Affected by paraplegia and arthritic changes/limitations of hands Finger Nose Finger Test: Mild dysmetria bilaterally Motor: Abnormal postural alignment and control, paraplegia Trunk/Postural Assessment  Cervical Assessment Cervical Assessment: Exceptions to [facility] (forward head) Thoracic Assessment Thoracic Assessment: Exceptions to [facility] (mild kyphosis)  Lumbar Assessment Lumbar Assessment: Exceptions to [facility] (posterior pelvic tilt) Postural Control Postural Control: Deficits on evaluation (limited in sitting and standing during functional activity)  Balance Balance Balance Assessed: Yes Dynamic Sitting Balance Sitting balance - Comments: Min A repositioning EOB before lying down in bed Static Standing Balance Static Standing - Level of Assistance: 1: +2 Total assist (using Stedy during toileting/dressing tasks) Extremity/Trunk Assessment RUE Assessment RUE Assessment: Within Functional Limits Active Range of Motion (AROM) Comments: WNL General Strength Comments: 4-/5 grossly LUE Assessment LUE Assessment: Within Functional Limits Active Range of Motion (AROM) Comments: WNL General Strength Comments: 4-/5 grossly  Care Tool Care Tool Self Care Eating   Eating Assist Level: Set up assist (per most recent staff documentation)    Oral Care    Oral Care Assist Level: Set up assist    Bathing   Body parts bathed by patient: Right arm;Left arm;Chest;Abdomen;Front perineal area;Right  upper leg;Left upper leg;Face Body parts bathed by helper: Buttocks;Right lower leg;Left lower leg   Assist Level: 2 Helpers (sit<stand using Stedy)    Upper Body Dressing(including orthotics)   What is the patient wearing?: Pull over shirt   Assist Level: Set up assist    Lower Body Dressing (excluding footwear)   What is the patient wearing?: Pants;Incontinence brief Assist for lower body dressing: 2 Helpers (using Stedy)    Putting on/Taking off footwear   What is the patient wearing?: Shoes;AFO (aircasts) Assist for footwear: Total Assistance - Patient < 25%       Care Tool Toileting Toileting activity   Assist for toileting: 2 Helpers (using Stedy)     Care Tool Bed Mobility Roll left and right activity        Sit to lying activity        Lying to sitting on side of bed activity         Care Tool Transfers Sit to stand transfer        Chair/bed transfer         Toilet transfer   Assist Level: 2 Helpers (using [place])     Care Tool Cognition  Expression of Ideas and Wants    Understanding Verbal and Non-Verbal Content     Memory/Recall Ability     Refer to Care Plan for Long Term Goals  SHORT TERM GOAL WEEK 1 OT Short Term Goal 1 (Week 1): Pt will complete BSC or toilet trasnfer with 1 assist OT Short Term Goal 2 (Week 1): Pt will complete LB dressing with no more than Max A using adaptive techniques OT Short Term Goal 3 (Week 1): Pt will direct care for 1 OOB trasnfer given min cues in prep for directing care to caregivers during ADL routine at home  Recommendations for other services: Neuropsych   Skilled Therapeutic Intervention Skilled OT session completed with focus on initial evaluation, education on OT role/POC, and establishment of patient-centered goals.   Pt greeted in the w/c, already wearing B aircasts + shoes, requesting to use the [facility] because she thought she had to void bladder. +2 assist for [facility] transfer to the [facility] Pt completed  bathing/dressing while sitting on BSC at sit<stand level using Stedy. Heavy +2 assist to rise into standing with facilitation to keep her trunk upright vs flexed over bar. Pt ultimately unable to void. She asked to return to bed due to fatigue. Stedy used for transfer back. Assisted pt with repositioning for comfort with heels floated. Left her in bed with all needs within  reach and bed alarm set.   2nd Session 1:1 tx (40 min) Pt greeted in bed, premedicated for pain and requesting to return to bed at end of tx. Max A for supine<sit. While EOB, worked on dynamic sitting balance via anterior reaches to target outside of base of support and crossing midline. Pt required a few rest breaks due to hip discomfort when leaning. Lateral leans Lt>Rt onto elbows also completed to work on functional transfer training and UB strengthening. Dynamometer testing also completed with reading of 21# Lt and Rt UE. She returned to bed at close of session and assisted with boosting herself up using the headboard (bed placed in trendelenburg position). While supine, educated pt on using the gait belt to assist with LE ROM. Pt remained in bed at close of session, all needs within reach and bed alarm set.   ADL ADL Eating: Set up Grooming: Setup Where Assessed-Grooming: Other (comment) (BSC) Upper Body Bathing: Setup Where Assessed-Upper Body Bathing: Other (Comment) (BSC) Lower Body Bathing: Dependent (+2 assist using Stedy lift) Where Assessed-Lower Body Bathing: Other (Comment) (sit<stand from [facility] using Stedy) Upper Body Dressing: Setup Where Assessed-Upper Body Dressing: Other (Comment) (BSC) Lower Body Dressing: Dependent (+2 assist sit<stand using Stedy) Where Assessed-Lower Body Dressing: Other (Comment) (BSC sit<stand using Stedy) Toileting: Dependent (+2 assist, using Stedy) Where Assessed-Toileting: Bedside Commode Toilet Transfer: Dependent (+2 assist using Stedy) Toilet Transfer Method: Stand  pivot Tub/Shower Transfer: Not assessed Mobility   +2 Stedy [facility] transfer   Discharge Criteria: Patient will be discharged from OT if patient refuses treatment 3 consecutive times without medical reason, if treatment goals not met, if there is a change in medical status, if patient makes no progress towards goals or if patient is discharged from hospital.  The above assessment, treatment plan, treatment alternatives and goals were discussed and mutually agreed upon: by patient  Skeet Simmer 03/21/2021, 12:32 PM

## 2021-03-20 NOTE — Progress Notes (Signed)
Physical Therapy Treatment Patient Details Name: Angela Morales MRN: [id number] DOB: Jul 08, 1945 Today's Date: 03/20/2021   History of Present Illness 75 y.o. female recently admitted 02/05/21 for GBS requiring intubation and trach, with d/c to CIR on 03/04/21, now readmitted 03/13/21 with persistent tachycardia and hypotension. Workup for aflutter with RVR. S/p cardioversion 9/19. Other PMH includes afib s/p recent ablation 01/28/21), SVT, severe lumbar scoliosis, celiac disease.    PT Comments    Pt was seen for practice with transfers, which required stabilization of ankles in braces for control esp LLE.  Pt is inverting on L ankle with any WB, but the combination of air splint and tennis shoe are helpful.  May require greater orthotic control depending on the recovery process on L LE.    Active to assisted ex on B legs was done to work on strength deficits, and noted that LLE is recovering but still unsteady.  Has better use of LLE in standing to help control power up and static standing.  Continue to work on the deficits that are limiting independence and focus more on control of L LE joints in assisted devices such as the stedy.   Recommendations for follow up therapy are one component of a multi-disciplinary discharge planning process, led by the attending physician.  Recommendations may be updated based on patient status, additional functional criteria and insurance authorization.  Follow Up Recommendations  CIR     Equipment Recommendations  Other (comment)    Recommendations for Other Services Rehab consult     Precautions / Restrictions Precautions Precautions: Fall;Other (comment) Precaution Comments: bladder/bowel incontinence Required Braces or Orthoses: Other Brace Other Brace: bilateral ankle air splints, PRAFOs Restrictions Weight Bearing Restrictions: No     Mobility  Bed Mobility Overal bed mobility: Needs Assistance Bed Mobility: Rolling;Sidelying to Sit;Sit to  Sidelying Rolling: Min assist Sidelying to sit: Min assist     Sit to sidelying: Mod assist General bed mobility comments: min assist as pt can help with RUE but is working at a disadvantage back to bed, RUE weaker then    Transfers Overall transfer level: Needs assistance Equipment used: None Transfers: Lateral/Scoot Transfers;Sit to/from Stand Sit to Stand: Max assist;From elevated surface        Lateral/Scoot Transfers: Mod assist;From elevated surface General transfer comment: mod to scoot with PT directly assisting pt to lift and shift over, but standing with PT directly almost fully max assist  Ambulation/Gait             General Gait Details: pt unable   Stairs             Wheelchair Mobility    Modified Rankin (Stroke Patients Only)       Balance Overall balance assessment: Needs assistance Sitting-balance support: Feet supported Sitting balance-Leahy Scale: Fair Sitting balance - Comments: fatigue with effort to sit on side of bed and exercise legs, but can maintain sit balance when not exercising legs Postural control: Posterior lean Standing balance support: Bilateral upper extremity supported Standing balance-Leahy Scale: Zero                              Cognition Arousal/Alertness: Awake/alert Behavior During Therapy: WFL for tasks assessed/performed Overall Cognitive Status: Within Functional Limits for tasks assessed  General Comments: good follow through on mobility and sequence for WB      Exercises General Exercises - Lower Extremity Ankle Circles/Pumps: AROM;5 reps Long Arc Quad: AROM;10 reps Heel Slides: AAROM;Strengthening;10 reps Hip ABduction/ADduction: AROM;10 reps    General Comments General comments (skin integrity, edema, etc.): pt is actively moving knees to extend but can do light resistance to flex knees      Pertinent Vitals/Pain Pain Assessment:  No/denies pain    Home Living                      Prior Function            PT Goals (current goals can now be found in the care plan section) Acute Rehab PT Goals Patient Stated Goal: Continue with rehab Progress towards PT goals: Progressing toward goals    Frequency    Min 4X/week      PT Plan Current plan remains appropriate    Co-evaluation              AM-PAC PT "6 Clicks" Mobility   Outcome Measure  Help needed turning from your back to your side while in a flat bed without using bedrails?: A Little Help needed moving from lying on your back to sitting on the side of a flat bed without using bedrails?: A Lot Help needed moving to and from a bed to a chair (including a wheelchair)?: A Lot Help needed standing up from a chair using your arms (e.g., wheelchair or bedside chair)?: A Lot Help needed to walk in hospital room?: Total Help needed climbing 3-5 steps with a railing? : Total 6 Click Score: 11    End of Session   Activity Tolerance: Patient tolerated treatment well Patient left: in bed;with call bell/phone within reach;with bed alarm set Nurse Communication: Mobility status PT Visit Diagnosis: Other abnormalities of gait and mobility (R26.89);Muscle weakness (generalized) (M62.81);Other symptoms and signs involving the nervous system (R29.898) Hemiplegia - caused by: Unspecified     Time: 6759-1638 PT Time Calculation (min) (ACUTE ONLY): 27 min  Charges:  $Therapeutic Exercise: 8-22 mins $Therapeutic Activity: 8-22 mins                 Ramond Dial 03/20/2021, 2:18 PM  Mee Hives, PT MS Acute Rehab Dept. Number: [place] [facility] [place]

## 2021-03-20 NOTE — Progress Notes (Signed)
Inpatient Rehab Admissions Coordinator:    I have insurance approval and a bed available for pt to admit to CIR today.  in agreement.  Will let pt/family and TOC team know.    Shann Medal, PT, DPT Admissions Coordinator [phone] 03/20/21  10:27 AM

## 2021-03-20 NOTE — Progress Notes (Signed)
INPATIENT REHABILITATION ADMISSION NOTE   Arrival Method: Bed     Mental Orientation: A&Ox4   Assessment: done   Skin: done   IV'S: none   Pain: none   Tubes and Drains: none   Safety Measures: reviewed with pt   Vital Signs: done   Height and Weight: done   Rehab Orientation: done   Family: notified by pt.     Notes: done  Gerald Stabs, RN

## 2021-03-20 NOTE — Progress Notes (Addendum)
Inpatient Rehabilitation Medication Review by a Pharmacist  A complete drug regimen review was completed for this patient to identify any potential clinically significant medication issues.  High Risk Drug Classes Is patient taking? Indication by Medication  Antipsychotic Yes Prochlorperazine PRN nausea/vomiting  Anticoagulant Yes Apixaban for atrial fibrillation  Antibiotic No   Opioid Yes Oxycodone PRN pain  Antiplatelet No   Hypoglycemics/insulin No   Vasoactive Medication No   Chemotherapy No   Other No      Type of Medication Issue Identified Description of Issue Recommendation(s)  Drug Interaction(s) (clinically significant)     Duplicate Therapy     Allergy     No Medication Administration End Date     Incorrect Dose     Additional Drug Therapy Needed     Significant med changes from prior encounter (inform family/care partners about these prior to discharge).    Other  The following meds were listed on pt's discharge med list from [facility] (pt did not receive these meds at [facility], nor were they ordered on admission to CIR): Albuterol nebs Gerhardt's butt cream Lip balm Melatonin CIR team to review on AM rounds    Clinically significant medication issues were identified that warrant physician communication and completion of prescribed/recommended actions by midnight of the next day:  No  Name of provider notified for urgent issues identified:  N/A  Time spent performing this drug regimen review (minutes):  [place], PharmD, BCPS, [facility] Clinical Pharmacist 03/20/2021 4:44 PM

## 2021-03-20 NOTE — PMR Pre-admission (Signed)
PMR Admission Coordinator Pre-Admission Assessment   Patient: Angela Morales is an 75 y.o., female MRN: [id number] DOB: [birth year] Height: _0  (162.6 cm) Weight: 64 kg   Insurance Information HMO: yes    PPO:      PCP:      IPA:      80/20:      OTHER:  PRIMARY: [employer] Medicare      Policy#: [id number]      Subscriber: pt CM Name: Pierce Crane      Phone#: [phone]     Fax#: [phone] Pre-Cert#: [id number] [place] for CIR provided by Mikal Plane with [employer] with updates due to fax listed above on 9/29   Employer:  Benefits:  Phone #: [phone]     Name:  Eff. Date: 06/28/20     Deduct: $0      Out of Pocket Max: $3900 (met [id number])      Life Max: n/a CIR: $295/day for days 1-5      SNF: 20 full days Outpatient:      Co-Pay: $30/visit Home Health: 100%      Co-Pay:  DME: 80%     Co-Pay: 20% Providers:  SECONDARY:       Policy#:      Phone#:    Development worker, community:       Phone#:    The Therapist, art Information Summary" for patients in Inpatient Rehabilitation Facilities with attached "Privacy Act [place] Records" was provided and verbally reviewed with: Patient and Family   Emergency Contact Information Contact Information       Name Relation Home Work [place] Spouse [phone]   [phone]           Current Medical History  Patient Admitting Diagnosis: Guillain Barre Syndrome    History of Present Illness: pt is a 75 y/o female with PMH of afib, s/p ablation on 8/3, and celiac disease who was admitted to [facility] on 8/11 with bilat upper and bil lower extremeity weakness and parasthesias beginning 8/8.  Pt developed ascending weakness with inability to ambulate.  No other prodrome or precipitates other than being started on gabapentin 1 month prior.  LP was deferred initially as she was on Eliquis (last dose 8/11).  IVIG started empirically on 8/12 and she continued to have some progressive weakness and bulbar dysfunction with  difficulty swallong and globus sensation with swallow, and voice weakness.  FVC has been consistently greater than 1.0L , NIF <-25.  She had an episode of desaturation, increased work of breathing and choking when eating Jell-O and pudding evening of 8/13 and required intubation on 8/14.  Underwent trach on 8/16.  Started round 2 of IVIG on 8/18-8/22 per neuro recs.  Developed aflutter on 8/27 and started 8/28 amio drip.  Bouts of hypotension over night, not responding to increased fluids, though to be due to vasoplegia.  She was cardioverted into sinus rhythm on 8/31. Currently tolerating a regular, gluten free diet.  On 28% trach collar.  Therapy evaluations completed and pt was recommended for CIR and pt was admitted on 9/7.  Early morning 9/16, pt developed persistent afib with RVR and was transferred back to the acute setting for further medical management.  She underwent DCCV on 9/19 and was deemed stable to return to CIR on 9/20.  Therapy evaluations completed on 9/20 and recommended CIR.     Patient's medical record from [facility] has been reviewed by the rehabilitation admission coordinator and physician.  Past Medical History      Past Medical History:  Diagnosis Date   Abnormal uterine bleeding      on HRT   Allergy      seasonal   Arthritis     Atrial fibrillation (HCC)     Celiac disease     DDD (degenerative disc disease)     Dysrhythmia     Glaucoma     Microscopic colitis     Osteoarthritis of both knees     PONV (postoperative nausea and vomiting)     SVT (supraventricular tachycardia) (HCC)        Has the patient had major surgery during 100 days prior to admission? Yes   Family History   family history includes Bipolar disorder in her son; Breast cancer in her cousin; Heart disease (age of onset: 49) in her brother; Liver disease in her son; Osteoarthritis in her maternal grandmother; Osteoporosis in her mother; Prostate cancer in her father; Stroke in her mother.    Current Medications   Current Facility-Administered Medications:    acetaminophen (TYLENOL) tablet 650 mg, 650 mg, Oral, Q6H PRN, Nahser, Wonda Cheng, MD, 650 mg at 03/13/21 0830   amiodarone (PACERONE) tablet 400 mg, 400 mg, Oral, Daily, Baldwin [place], PA-C, 400 mg at 03/20/21 5625   apixaban (ELIQUIS) tablet 5 mg, 5 mg, Oral, BID, Nahser, Wonda Cheng, MD, 5 mg at 03/20/21 0846   bethanechol (URECHOLINE) tablet 20 mg, 20 mg, Oral, TID, Nahser, Wonda Cheng, MD, 20 mg at 03/20/21 0847   cyclobenzaprine (FLEXERIL) tablet 5 mg, 5 mg, Oral, BID, Nahser, Wonda Cheng, MD, 5 mg at 03/20/21 0847   feeding supplement (BOOST / RESOURCE BREEZE) liquid 1 Container, 1 Container, Oral, BID BM, Nahser, Wonda Cheng, MD, 1 Container at 03/13/21 1417   gabapentin (NEURONTIN) capsule 400 mg, 400 mg, Oral, QHS, Nahser, Wonda Cheng, MD, 400 mg at 03/19/21 2209   latanoprost (XALATAN) 0.005 % ophthalmic solution 1 drop, 1 drop, Left Eye, QHS, Nahser, Wonda Cheng, MD, 1 drop at 03/19/21 2210   oxyCODONE (Oxy IR/ROXICODONE) immediate release tablet 2.5-5 mg, 2.5-5 mg, Oral, Q3H PRN, Nahser, Wonda Cheng, MD, 5 mg at 03/19/21 2209   pantoprazole (PROTONIX) EC tablet 40 mg, 40 mg, Oral, Daily, Nahser, Wonda Cheng, MD, 40 mg at 03/20/21 0847   polyethylene glycol (MIRALAX / GLYCOLAX) packet 17 g, 17 g, Oral, Daily, Nahser, Wonda Cheng, MD, 17 g at 03/14/21 6389   polyvinyl alcohol (LIQUIFILM TEARS) 1.4 % ophthalmic solution 1 drop, 1 drop, Both Eyes, PRN, Nahser, Wonda Cheng, MD   potassium chloride (KLOR-CON) packet 60 mEq, 60 mEq, Oral, Daily, Nahser, Wonda Cheng, MD, 60 mEq at 03/20/21 0848   senna-docusate (Senokot-S) tablet 1 tablet, 1 tablet, Oral, QHS PRN, Nahser, Wonda Cheng, MD   timolol (TIMOPTIC) 0.5 % ophthalmic solution 1 drop, 1 drop, Left Eye, BID, Nahser, Wonda Cheng, MD, 1 drop at 03/20/21 0848   traZODone (DESYREL) tablet 50 mg, 50 mg, Oral, QHS PRN, Nahser, Wonda Cheng, MD   Patients Current Diet:  Diet Order                  Diet regular  Room service appropriate? Yes; Fluid consistency: Thin  Diet effective now                         Precautions / Restrictions Precautions Precautions: Fall, Other (comment) Precaution Comments: bladder/bowel incontinence Other Brace: bilateral ankle air splints, PRAFOs  Restrictions Weight Bearing Restrictions: No    Has the patient had 2 or more falls or a fall with injury in the past year? No   Prior Activity Level   Prior Functional Level Self Care: Did the patient need help bathing, dressing, using the toilet or eating? Independent   Indoor Mobility: Did the patient need assistance with walking from room to room (with or without device)? Independent   Stairs: Did the patient need assistance with internal or external stairs (with or without device)? Independent   Functional Cognition: Did the patient need help planning regular tasks such as shopping or remembering to take medications? Independent   Patient Information Are you of Hispanic, Latino/a,or Spanish origin?: A. No, not of Hispanic, Latino/a, or Spanish origin What is your race?: A. White Do you need or want an interpreter to communicate with a doctor or health care staff?: 0. No   Patient's Response To:  Health Literacy and Transportation Is the patient able to respond to health literacy and transportation needs?: Yes Health Literacy - How often do you need to have someone help you when you read instructions, pamphlets, or other written material from your doctor or pharmacy?: Never In the past 12 months, has lack of transportation kept you from medical appointments or from getting medications?: No In the past 12 months, has lack of transportation kept you from meetings, work, or from getting things needed for daily living?: No   Development worker, international aid / [place] Devices/Equipment: None Home Equipment: None   Prior Device Use: Indicate devices/aids used by the patient prior to current illness,  exacerbation or injury? None of the above   Current Functional Level Cognition   Overall Cognitive Status: Within Functional Limits for tasks assessed Orientation Level: Oriented X4 General Comments: pt able to follow all commands and demo good safety awareness and recall of techniques    Extremity Assessment (includes Sensation/Coordination)   Upper Extremity Assessment: Generalized weakness RUE Deficits / Details: 3/5 grossly strength throughout. Poor pinch strength with numbness and tingling RUE Sensation: decreased light touch, decreased proprioception RUE Coordination: decreased fine motor, decreased gross motor LUE Deficits / Details: 3/5 strength throughout. Poor pinch strength and numbness and tingling LUE Sensation: decreased light touch, decreased proprioception LUE Coordination: decreased fine motor, decreased gross motor  Lower Extremity Assessment: Defer to PT evaluation RLE Deficits / Details: 2/5 for ankle DF and PF, 2-/5 for ankle eversion, 3/5 knee flexion and extension, is able to complete against gravity with max encouragement and effort RLE Sensation: decreased light touch, decreased proprioception RLE Coordination: decreased fine motor, decreased gross motor LLE Deficits / Details: 2/5 for ankle DF and PF, 2-/5 for ankle eversion, 2/5 knee flexion and extension LLE Sensation: decreased light touch, decreased proprioception LLE Coordination: decreased fine motor, decreased gross motor     ADLs   Overall ADL's : Needs assistance/impaired Eating/Feeding: Set up, With adaptive utensils Grooming: Set up, Sitting, Brushing hair Grooming Details (indicate cue type and reason): assist to open toothpaste tube (remove cap) but able to place toothpaste on toothbrush without difficulty. Did request assist to brush back of hair for thoroughness but pt likely able to do this Upper Body Bathing: Bed level, Minimal assistance Lower Body Bathing: Maximal assistance, Bed  level Lower Body Bathing Details (indicate cue type and reason): to bathe peri region after purewick malfunction. able to assist well in rolling Upper Body Dressing : Minimal assistance, Bed level Lower Body Dressing: Maximal assistance, Bed level Lower Body  Dressing Details (indicate cue type and reason): for ankle splints and tennis shoes. Guided pt in lateral leans sitting EOB and reaching to bottom to simulate LB dressing techniques Toilet Transfer: Moderate assistance, +2 for physical assistance, Transfer board Toileting- Clothing Manipulation and Hygiene: Total assistance, Bed level Functional mobility during ADLs: +2 for physical assistance, Moderate assistance General ADL Comments: Session focused on UE HEP education with handout provided, lateral leans and balance sitting EOB and sliding board transfer to wheelchair. Provided handout for theraputty exercises as well.     Mobility   Overal bed mobility: Needs Assistance Bed Mobility: Rolling, Sidelying to Sit, Sit to Sidelying Rolling: Min assist Sidelying to sit: Min assist Sit to supine: Mod assist Sit to sidelying: Mod assist General bed mobility comments: minA to BLE to move to EOB, modA to return to bed. pt with good use of BUE to elevate trunk     Transfers   Overall transfer level: Needs assistance Equipment used: None Transfers: Lateral/Scoot Transfers Sit to Stand: Max assist, +2 physical assistance  Lateral/Scoot Transfers: Mod assist General transfer comment: modA to scoot along EOB, only completed x1 due to pain and inability to position BLE in neutral to wt bear     Ambulation / Gait / Stairs / Wheelchair Mobility   Ambulation/Gait General Gait Details: pt unable     Posture / Balance Dynamic Sitting Balance Sitting balance - Comments: pt able to improve to static and dynamic sitting without UE support, can lean outside midline and recover without use of UE Balance Overall balance assessment: Needs  assistance Sitting-balance support: Feet supported Sitting balance-Leahy Scale: Fair Sitting balance - Comments: pt able to improve to static and dynamic sitting without UE support, can lean outside midline and recover without use of UE Postural control: Posterior lean Standing balance support: Bilateral upper extremity supported Standing balance-Leahy Scale: Zero     Special needs/care consideration Diabetic management yes    Previous Home Environment (from acute therapy documentation) Living Arrangements: Spouse/significant other, Children  Lives With: Spouse Available Help at Discharge: Family, Available PRN/intermittently Type of Home: House Home Layout: Multi-level, Able to live on main level with bedroom/bathroom, Full bath on main level Home Access: Stairs to enter Entrance Stairs-Rails: Left Entrance Stairs-Number of Steps: 3-4 Bathroom Shower/Tub: Tub/shower unit, Walk-in shower Home Care Services: No Additional Comments: Lives with husband and son; husband works   Nurse, learning disability for Discharge Living Setting: Patient's home, Lives with (comment) (spouse and adult son) Type of Home at Discharge: House Discharge Home Layout: Able to live on main level with bedroom/bathroom Discharge Home Access: Stairs to enter Entrance Stairs-Rails: Left Entrance Stairs-Number of Steps: 3-4 Discharge Bathroom Shower/Tub: Tub/shower unit, Walk-in shower Discharge Bathroom Toilet: Standard Discharge Bathroom Accessibility: Yes How Accessible: Accessible via walker, Accessible via wheelchair Does the patient have any problems obtaining your medications?: No   Social/Family/Support Systems Patient Roles: Spouse Anticipated Caregiver: Jenella Craigie Caregiver Availability: 24/7 Discharge Plan Discussed with Primary Caregiver: Yes Is Caregiver In Agreement with Plan?: Yes Does Caregiver/Family have Issues with Lodging/Transportation while Pt is in Rehab?: No    Goals Patient/Family Goal for Rehab: PT/OT supervision to min assist assist w/c level, SLP n/a Expected length of stay: 18-21 Pt/Family Agrees to Admission and willing to participate: Yes Program Orientation Provided & Reviewed with Pt/Caregiver Including Roles  & Responsibilities: Yes  Barriers to Discharge: Insurance for SNF coverage, Home environment access/layout   Decrease burden of Care through IP rehab admission: n/a  Possible need for SNF placement upon discharge: Not antipcated   Patient Condition: I have reviewed medical records from [facility], spoken with CM, and patient and spouse. I met with patient at the bedside and discussed via phone for inpatient rehabilitation assessment.  Patient will benefit from ongoing PT, OT, and SLP, can actively participate in 3 hours of therapy a day 5 days of the week, and can make measurable gains during the admission.  Patient will also benefit from the coordinated team approach during an Inpatient Acute Rehabilitation admission.  The patient will receive intensive therapy as well as Rehabilitation physician, nursing, social worker, and care management interventions.  Due to bladder management, bowel management, safety, skin/wound care, disease management, medication administration, pain management, and patient education the patient requires 24 hour a day rehabilitation nursing.  The patient is currently max +2 with mobility and basic ADLs.  Discharge setting and therapy post discharge at home with home health is anticipated.  Patient has agreed to participate in the Acute Inpatient Rehabilitation Program and will admit today.   Preadmission Screen Completed By:  Michel Santee, PT, DPT 03/20/2021 10:33 AM ______________________________________________________________________   Discussed status with Dr. Posey Pronto on 10:33 AM  at 03/20/21 and received approval for admission today.   Admission Coordinator:  Michel Santee, PT, DPT time 10:33 AM Sudie Grumbling  03/20/21    Assessment/Plan: Diagnosis: Guillain Barre Syndrome Does the need for close, 24 hr/day Medical supervision in concert with the patient's rehab needs make it unreasonable for this patient to be served in a less intensive setting? Yes Co-Morbidities requiring supervision/potential complications: afib, celiac disease  Due to bladder management, bowel management, safety, disease management, and patient education, does the patient require 24 hr/day rehab nursing? Yes Does the patient require coordinated care of a physician, rehab nurse, PT, OT to address physical and functional deficits in the context of the above medical diagnosis(es)? Yes Addressing deficits in the following areas: balance, endurance, locomotion, strength, transferring, bathing, dressing, toileting, and psychosocial support Can the patient actively participate in an intensive therapy program of at least 3 hrs of therapy 5 days a week? Yes The potential for patient to make measurable gains while on inpatient rehab is excellent Anticipated functional outcomes upon discharge from inpatient rehab: min assist PT, min assist OT, n/a SLP  Estimated rehab length of stay to reach the above functional goals is: 14-17 days. Anticipated discharge destination: Home 10. Overall Rehab/Functional Prognosis: good   MD Signature: Delice Lesch, MD, ABPMR

## 2021-03-20 NOTE — H&P (Signed)
Physical Medicine and Rehabilitation Admission H&P    CC: Functional deficits.   HPI: Angela Morales. 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 She was treated with IVIG but continued to worsen with aspiration event requiring intubation for hypoxic hypercarbic respiratory failure. She was treated for aspiration pneumonia, but hospital course significant for A fib with RVR, progressive hyponatremia, early acute cholecystitis as well as hypotension requiring pressures. She required tracheostomy prior to wean to ATC and ultimately decannulated by 09/06.  She continued to have significant impairments in mobility and ADLs and was admitted to CIR on 03/04/2021. She was making gains but developed A. flutter with hypotension on 03/13/2021 requiring discharge to acute hospital for telemetry/management. She was started on amiodarone drip but continued to have issues with A flutter. She  underwent DC cardioversion on by Dr. Acie Fredrickson on 03/16/2021 and remains in NSR. Therapy resumed and she continues to be limited by bilateral lower extremity weakness, increased tone, balance and postural deficits. CIR recommended due to functional decline.   Please see preadmission assessment from earlier today as well.  Review of Systems  Constitutional:  Negative for chills and fever.  HENT:  Negative for hearing loss and tinnitus.   Eyes:  Negative for blurred vision and double vision.  Respiratory:  Negative for cough and shortness of breath.   Cardiovascular:  Negative for chest pain and palpitations.  Gastrointestinal:  Negative for abdominal pain, constipation, diarrhea, heartburn and nausea.  Genitourinary:  Negative for dysuria and urgency.  Musculoskeletal:  Positive for back pain and myalgias.  Skin:  Negative for rash.   Neurological:  Positive for tingling, sensory change (numbness bilateral finger and from Knees down.) and weakness. Negative for dizziness and headaches.  Psychiatric/Behavioral:  The patient does not have insomnia.   All other systems reviewed and are negative.   Past Medical History:  Diagnosis Date   Abnormal uterine bleeding    on HRT   Allergy    seasonal   Arthritis    Atrial fibrillation (HCC)    Celiac disease    DDD (degenerative disc disease)    Dysrhythmia    Glaucoma    Microscopic colitis    Osteoarthritis of both knees    PONV (postoperative nausea and vomiting)    SVT (supraventricular tachycardia) ([place])     Past Surgical History:  Procedure Laterality Date   APPENDECTOMY     ATRIAL FIBRILLATION ABLATION N/A 01/28/2021   Procedure: ATRIAL FIBRILLATION ABLATION;  Surgeon: Constance Haw, MD;  Location: [facility];  Service: Cardiovascular;  Laterality: N/A;   CARDIOVERSION N/A 10/30/2020   Procedure: CARDIOVERSION;  Surgeon: Jerline Pain, MD;  Location: [facility];  Service: Cardiovascular;  Laterality: N/A;   CARDIOVERSION N/A 03/16/2021   Procedure: CARDIOVERSION;  Surgeon: Thayer Headings, MD;  Location: [facility];  Service: Cardiovascular;  Laterality: N/A;   COLONOSCOPY     FOOT FRACTURE SURGERY  10/2013   TONSILLECTOMY AND ADENOIDECTOMY     TOTAL KNEE ARTHROPLASTY Left 03/22/2016   Procedure: TOTAL KNEE ARTHROPLASTY;  Surgeon: Dorna Leitz, MD;  Location: [facility];  Service: Orthopedics;  Laterality: Left;   TUBAL LIGATION  1981   VAGINAL HYSTERECTOMY  04/20/05   prolapse, adenomyosis    Family History  Problem Relation Age of Onset  Osteoporosis Mother    Stroke Mother        mini   Prostate cancer Father    Heart disease Brother 47       shunt put in heart   Osteoarthritis Maternal Grandmother    Bipolar disorder Son    Liver disease Son    Breast cancer Cousin        1st cousin   Colon cancer Neg Hx     Social History:   Married. 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 She has never used smokeless tobacco. She reports current alcohol use of about 4.0 - 6.0 standard drinks per week. She reports that she does not use drugs.    Allergies  Allergen Reactions   Gluten Meal Other (See Comments)    celiac disease   Cefdinir Rash    Medications Prior to Admission  Medication Sig Dispense Refill   acetaminophen (TYLENOL) 160 MG/5ML solution Take 20.3 mLs (650 mg total) by mouth every 6 (six) hours as needed for mild pain, moderate pain or fever. 120 mL 0   albuterol (PROVENTIL) (2.5 MG/3ML) 0.083% nebulizer solution Take 3 mLs (2.5 mg total) by nebulization every 4 (four) hours as needed for wheezing or shortness of breath. 75 mL 12   amiodarone (PACERONE) 200 MG tablet Take 1 tablet (200 mg total) by mouth daily.     bethanechol (URECHOLINE) 10 MG tablet Take 2 tablets (20 mg total) by mouth 3 (three) times daily.     chlorhexidine (PERIDEX) 0.12 % solution 15 mLs by Mouth Rinse route 2 (two) times daily. 120 mL 0   Chlorhexidine Gluconate Cloth 2 % PADS Apply 6 each topically daily.     cyclobenzaprine (FLEXERIL) 5 MG tablet Take 1 tablet (5 mg total) by mouth 2 (two) times daily as needed for muscle spasms. 30 tablet 0   docusate (COLACE) 50 MG/5ML liquid Take 10 mLs (100 mg total) by mouth 2 (two) times daily. 100 mL 0   gabapentin (NEURONTIN) 250 MG/5ML solution Take 4 mLs (200 mg total) by mouth at bedtime.  12   guaiFENesin (ROBITUSSIN) 100 MG/5ML SOLN Take 15 mLs (300 mg total) by mouth every 6 (six) hours as needed for cough or to loosen phlegm. 236 mL 0   latanoprost (XALATAN) 0.005 % ophthalmic solution Place 1 drop into the left eye at bedtime. 2.5 mL 12   lip balm (BLISTEX) OINT Apply 1 application topically as needed for lip care.     melatonin 3 MG TABS tablet Take 1 tablet (3 mg total) by mouth at bedtime.  0   Nutritional Supplements  (FEEDING SUPPLEMENT, OSMOLITE 1.5 CAL,) LIQD Place 720 mLs into feeding tube daily.  0   Nutritional Supplements (FEEDING SUPPLEMENT, PROSOURCE TF,) liquid Place 45 mLs into feeding tube 2 (two) times daily.     Nystatin (GERHARDT'S BUTT CREAM) CREA Apply 1 application topically as needed for irritation.     oxyCODONE (OXY IR/ROXICODONE) 5 MG immediate release tablet Take 0.5 tablets (2.5 mg total) by mouth every 3 (three) hours as needed for moderate pain. 30 tablet 0   pantoprazole (PROTONIX) 40 MG tablet Take 1 tablet (40 mg total) by mouth daily.     polyethylene glycol (MIRALAX / GLYCOLAX) 17 g packet Take 17 g by mouth daily. 14 each 0   polyvinyl alcohol (LIQUIFILM TEARS) 1.4 % ophthalmic solution Place 1 drop into both eyes as needed for  dry eyes. 15 mL 0   potassium chloride (KLOR-CON) 20 MEQ packet Take 60 mEq by mouth daily.     QUEtiapine (SEROQUEL) 25 MG tablet Take 1 tablet (25 mg total) by mouth at bedtime.     senna-docusate (SENOKOT-S) 8.6-50 MG tablet Take 1 tablet by mouth at bedtime as needed for mild constipation.      Drug Regimen Review  Drug regimen was reviewed and remains appropriate with no significant issues identified  Home: Home Living Family/patient expects to be discharged to:: Inpatient rehab Living Arrangements: Spouse/significant other, Children Available Help at Discharge: Family, Available PRN/intermittently Type of Home: House Home Access: Stairs to enter [employer] of Steps: 3-4 Entrance Stairs-Rails: Left Home Layout: Multi-level, Able to live on main level with bedroom/bathroom, Full bath on main level Bathroom Shower/Tub: Tub/shower unit, Walk-in shower Home Equipment: None Additional Comments: Lives with husband and son; husband works  Lives With: Spouse   Functional History: Prior Function Level of Independence: Independent Comments: Per chart: Independent without DME; retired Press photographer; drives; enjoys  babysitting grandkids (89 and 60 y.o.), enjoys time with cat and dog, watching tv  Functional Status:  Mobility: Bed Mobility Overal bed mobility: Needs Assistance Bed Mobility: Rolling, Sidelying to Sit, Sit to Sidelying Rolling: Min assist Sidelying to sit: Min assist Sit to supine: Mod assist Sit to sidelying: Mod assist General bed mobility comments: minA to BLE to move to EOB, modA to return to bed. pt with good use of BUE to elevate trunk Transfers Overall transfer level: Needs assistance Equipment used: None Transfers: Lateral/Scoot Transfers Sit to Stand: Max assist, +2 physical assistance  Lateral/Scoot Transfers: Mod assist General transfer comment: modA to scoot along EOB, only completed x1 due to pain and inability to position BLE in neutral to wt bear Ambulation/Gait General Gait Details: pt unable    ADL: ADL Overall ADL's : Needs assistance/impaired Eating/Feeding: Set up, With adaptive utensils Grooming: Set up, Sitting, Brushing hair Grooming Details (indicate cue type and reason): assist to open toothpaste tube (remove cap) but able to place toothpaste on toothbrush without difficulty. Did request assist to brush back of hair for thoroughness but pt likely able to do this Upper Body Bathing: Bed level, Minimal assistance Lower Body Bathing: Maximal assistance, Bed level Lower Body Bathing Details (indicate cue type and reason): to bathe peri region after purewick malfunction. able to assist well in rolling Upper Body Dressing : Minimal assistance, Bed level Lower Body Dressing: Maximal assistance, Bed level Lower Body Dressing Details (indicate cue type and reason): for ankle splints and tennis shoes. Guided pt in lateral leans sitting EOB and reaching to bottom to simulate LB dressing techniques Toilet Transfer: Moderate assistance, +2 for physical assistance, Transfer board Toileting- Clothing Manipulation and Hygiene: Total assistance, Bed level Functional  mobility during ADLs: +2 for physical assistance, Moderate assistance General ADL Comments: Session focused on UE HEP education with handout provided, lateral leans and balance sitting EOB and sliding board transfer to wheelchair. Provided handout for theraputty exercises as well.  Cognition: Cognition Overall Cognitive Status: Within Functional Limits for tasks assessed Orientation Level: Oriented X4 Cognition Arousal/Alertness: Awake/alert Behavior During Therapy: WFL for tasks assessed/performed Overall Cognitive Status: Within Functional Limits for tasks assessed General Comments: pt able to follow all commands and demo good safety awareness and recall of techniques   Blood pressure 116/66, pulse 83, temperature 97.9 F (36.6 C), temperature source Oral, resp. rate 12, height 5' 4"  (1.626 m), weight 64 kg, last menstrual  period 04/20/2005, SpO2 100 %. Physical Exam Vitals and nursing note reviewed.  Constitutional:      General: She is not in acute distress.    Appearance: Normal appearance. She is normal weight. She is not ill-appearing.  HENT:     Head: Normocephalic and atraumatic.     Right Ear: External ear normal.     Left Ear: External ear normal.     Nose: Nose normal.  Eyes:     General:        Right eye: No discharge.        Left eye: No discharge.     Extraocular Movements: Extraocular movements intact.  Neck:     Comments: Trach stoma well healed and closed.  Cardiovascular:     Rate and Rhythm: Normal rate and regular rhythm.  Pulmonary:     Effort: Pulmonary effort is normal. No respiratory distress.     Breath sounds: Normal breath sounds. No stridor.  Abdominal:     General: Abdomen is flat. Bowel sounds are normal. There is no distension.  Musculoskeletal:     Cervical back: Normal range of motion and neck supple.     Comments: No edema or tenderness in extremities  Skin:    General: Skin is warm and dry.  Neurological:     Mental Status: She is  alert and oriented to person, place, and time.     Comments: Alert and oriented Motor: Bilateral upper extremities: 4+/5 proximal distal Bilateral lower extremities: Flexion, knee extension 4/5, ankle dorsiflexion 4 -/5 Sensation intact to light touch bilateral feet and fingertips  Psychiatric:        Mood and Affect: Mood normal.        Behavior: Behavior normal.    Results for orders placed or performed during the hospital encounter of 03/13/21 (from the past 48 hour(s))  Basic metabolic panel     Status: Abnormal   Collection Time: 03/19/21  1:24 AM  Result Value Ref Range   Sodium 132 (L) 135 - 145 mmol/L   Potassium 3.7 3.5 - 5.1 mmol/L   Chloride 99 98 - 111 mmol/L   CO2 26 22 - 32 mmol/L   Glucose, Bld 98 70 - 99 mg/dL    Comment: Glucose reference range applies only to samples taken after fasting for at least 8 hours.   BUN 6 (L) 8 - 23 mg/dL   Creatinine, Ser 0.41 (L) 0.44 - 1.00 mg/dL   Calcium 8.6 (L) 8.9 - 10.3 mg/dL   GFR, Estimated >60 >60 mL/min    Comment: (NOTE) Calculated using the CKD-EPI Creatinine Equation (2021)    Anion gap 7 5 - 15    Comment: Performed at [facility] [address], [place], [place] [address]   No results found.     Medical Problem List and Plan: 1.  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  -patient may shower  -ELOS/Goals: 17-20 days/min a  Admit to CIR 2.  Antithrombotics: -DVT/anticoagulation:  Pharmaceutical: Other (comment) --Eliquis  -antiplatelet therapy: N/A 3. Pain Management: Oxycodone  prn.   Monitor with increased activity comfortably for neuropathic pain 4. Mood: LCSW to follow for evaluation and support.   -antipsychotic agents: N/A 5. Neuropsych: This patient is capable of making decisions on her own behalf. 6. Skin/Wound Care: Routine pressure relief measures.  7. Fluids/Electrolytes/Nutrition: Monitor I/Os.  Does not like nutritional  supplements.  --Will add Juven for low calorie malnutrition.  CMP ordered 8. A flutter s/p DCCV: No in NSR on amiodarone and Eliquis  --Monitor HR/BP on TID basis  Monitor with increased activity 9. Hypokalemia: Continue K dur for supplement  CMP ordered 10. Neurogenic bladder: Monitor voiding with PVRs/bladder scans,  --Continue Urecholine TID 11. Neurogenic bowel?: Has been incontinent of bowel--last BM yesterday per pt.    --Has been refusing laxative-->will make it prn.  12. Hyponatremia: Continues to vary from 131-134.    CMP ordered  Bary Leriche, PA-C 03/20/2021  I have personally performed a face to face diagnostic evaluation, including, but not limited to relevant history and physical exam findings, of this patient and developed relevant assessment and plan.  Additionally, I have reviewed and concur with the physician assistant's documentation above.  Delice Lesch, MD, ABPMR

## 2021-03-20 NOTE — H&P (Signed)
Physical Medicine and Rehabilitation Admission H&P    CC: Functional deficits.   HPI: Angela Morales. 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 She was treated with IVIG but continued to worsen with aspiration event requiring intubation for hypoxic hypercarbic respiratory failure. She was treated for aspiration pneumonia, but hospital course significant for A fib with RVR, progressive hyponatremia, early acute cholecystitis as well as hypotension requiring pressures. She required tracheostomy prior to wean to ATC and ultimately decannulated by 09/06.  She continued to have significant impairments in mobility and ADLs and was admitted to CIR on 03/04/2021. She was making gains but developed A. flutter with hypotension on 03/13/2021 requiring discharge to acute hospital for telemetry/management. She was started on amiodarone drip but continued to have issues with A flutter. She  underwent DC cardioversion on by Dr. Acie Fredrickson on 03/16/2021 and remains in NSR. Therapy resumed and she continues to be limited by bilateral lower extremity weakness, increased tone, balance and postural deficits. CIR recommended due to functional decline.   Please see preadmission assessment from earlier today as well.  Review of Systems  Constitutional:  Negative for chills and fever.  HENT:  Negative for hearing loss and tinnitus.   Eyes:  Negative for blurred vision and double vision.  Respiratory:  Negative for cough and shortness of breath.   Cardiovascular:  Negative for chest pain and palpitations.  Gastrointestinal:  Negative for abdominal pain, constipation, diarrhea, heartburn and nausea.  Genitourinary:  Negative for dysuria and urgency.  Musculoskeletal:  Positive for back pain and myalgias.  Skin:  Negative for rash.   Neurological:  Positive for tingling, sensory change (numbness bilateral finger and from Knees down.) and weakness. Negative for dizziness and headaches.  Psychiatric/Behavioral:  The patient does not have insomnia.   All other systems reviewed and are negative.   Past Medical History:  Diagnosis Date   Abnormal uterine bleeding    on HRT   Allergy    seasonal   Arthritis    Atrial fibrillation (HCC)    Celiac disease    DDD (degenerative disc disease)    Dysrhythmia    Glaucoma    Microscopic colitis    Osteoarthritis of both knees    PONV (postoperative nausea and vomiting)    SVT (supraventricular tachycardia) ([facility])     Past Surgical History:  Procedure Laterality Date   APPENDECTOMY     ATRIAL FIBRILLATION ABLATION N/A 01/28/2021   Procedure: ATRIAL FIBRILLATION ABLATION;  Surgeon: Constance Haw, MD;  Location: [facility];  Service: Cardiovascular;  Laterality: N/A;   CARDIOVERSION N/A 10/30/2020   Procedure: CARDIOVERSION;  Surgeon: Jerline Pain, MD;  Location: [facility] ENDOSCOPY;  Service: Cardiovascular;  Laterality: N/A;   CARDIOVERSION N/A 03/16/2021   Procedure: CARDIOVERSION;  Surgeon: Thayer Headings, MD;  Location: [facility] ENDOSCOPY;  Service: Cardiovascular;  Laterality: N/A;   COLONOSCOPY     FOOT FRACTURE SURGERY  10/2013   TONSILLECTOMY AND ADENOIDECTOMY     TOTAL KNEE ARTHROPLASTY Left 03/22/2016   Procedure: TOTAL KNEE ARTHROPLASTY;  Surgeon: Dorna Leitz, MD;  Location: [facility];  Service: Orthopedics;  Laterality: Left;   TUBAL LIGATION  1981   VAGINAL HYSTERECTOMY  04/20/05   prolapse, adenomyosis    Family History  Problem Relation Age of Onset  Osteoporosis Mother    Stroke Mother        mini   Prostate cancer Father    Heart disease Brother 43       shunt put in heart   Osteoarthritis Maternal Grandmother    Bipolar disorder Son    Liver disease Son    Breast cancer Cousin        1st cousin   Colon cancer Neg Hx     Social History:   Married. 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 She has never used smokeless tobacco. She reports current alcohol use of about 4.0 - 6.0 standard drinks per week. She reports that she does not use drugs.    Allergies  Allergen Reactions   Gluten Meal Other (See Comments)    celiac disease   Cefdinir Rash    Medications Prior to Admission  Medication Sig Dispense Refill   acetaminophen (TYLENOL) 160 MG/5ML solution Take 20.3 mLs (650 mg total) by mouth every 6 (six) hours as needed for mild pain, moderate pain or fever. 120 mL 0   albuterol (PROVENTIL) (2.5 MG/3ML) 0.083% nebulizer solution Take 3 mLs (2.5 mg total) by nebulization every 4 (four) hours as needed for wheezing or shortness of breath. 75 mL 12   amiodarone (PACERONE) 200 MG tablet Take 1 tablet (200 mg total) by mouth daily.     bethanechol (URECHOLINE) 10 MG tablet Take 2 tablets (20 mg total) by mouth 3 (three) times daily.     chlorhexidine (PERIDEX) 0.12 % solution 15 mLs by Mouth Rinse route 2 (two) times daily. 120 mL 0   Chlorhexidine Gluconate Cloth 2 % PADS Apply 6 each topically daily.     cyclobenzaprine (FLEXERIL) 5 MG tablet Take 1 tablet (5 mg total) by mouth 2 (two) times daily as needed for muscle spasms. 30 tablet 0   docusate (COLACE) 50 MG/5ML liquid Take 10 mLs (100 mg total) by mouth 2 (two) times daily. 100 mL 0   gabapentin (NEURONTIN) 250 MG/5ML solution Take 4 mLs (200 mg total) by mouth at bedtime.  12   guaiFENesin (ROBITUSSIN) 100 MG/5ML SOLN Take 15 mLs (300 mg total) by mouth every 6 (six) hours as needed for cough or to loosen phlegm. 236 mL 0   latanoprost (XALATAN) 0.005 % ophthalmic solution Place 1 drop into the left eye at bedtime. 2.5 mL 12   lip balm (BLISTEX) OINT Apply 1 application topically as needed for lip care.     melatonin 3 MG TABS tablet Take 1 tablet (3 mg total) by mouth at bedtime.  0   Nutritional Supplements  (FEEDING SUPPLEMENT, OSMOLITE 1.5 CAL,) LIQD Place 720 mLs into feeding tube daily.  0   Nutritional Supplements (FEEDING SUPPLEMENT, PROSOURCE TF,) liquid Place 45 mLs into feeding tube 2 (two) times daily.     Nystatin (GERHARDT'S BUTT CREAM) CREA Apply 1 application topically as needed for irritation.     oxyCODONE (OXY IR/ROXICODONE) 5 MG immediate release tablet Take 0.5 tablets (2.5 mg total) by mouth every 3 (three) hours as needed for moderate pain. 30 tablet 0   pantoprazole (PROTONIX) 40 MG tablet Take 1 tablet (40 mg total) by mouth daily.     polyethylene glycol (MIRALAX / GLYCOLAX) 17 g packet Take 17 g by mouth daily. 14 each 0   polyvinyl alcohol (LIQUIFILM TEARS) 1.4 % ophthalmic solution Place 1 drop into both eyes as needed for  dry eyes. 15 mL 0   potassium chloride (KLOR-CON) 20 MEQ packet Take 60 mEq by mouth daily.     QUEtiapine (SEROQUEL) 25 MG tablet Take 1 tablet (25 mg total) by mouth at bedtime.     senna-docusate (SENOKOT-S) 8.6-50 MG tablet Take 1 tablet by mouth at bedtime as needed for mild constipation.      Drug Regimen Review  Drug regimen was reviewed and remains appropriate with no significant issues identified  Home: Home Living Family/patient expects to be discharged to:: Inpatient rehab Living Arrangements: Spouse/significant other, Children Available Help at Discharge: Family, Available PRN/intermittently Type of Home: House Home Access: Stairs to enter [employer] of Steps: 3-4 Entrance Stairs-Rails: Left Home Layout: Multi-level, Able to live on main level with bedroom/bathroom, Full bath on main level Bathroom Shower/Tub: Tub/shower unit, Walk-in shower Home Equipment: None Additional Comments: Lives with husband and son; husband works  Lives With: Spouse   Functional History: Prior Function Level of Independence: Independent Comments: Per chart: Independent without DME; retired Press photographer; drives; enjoys  babysitting grandkids (75 and 70 y.o.), enjoys time with cat and dog, watching tv  Functional Status:  Mobility: Bed Mobility Overal bed mobility: Needs Assistance Bed Mobility: Rolling, Sidelying to Sit, Sit to Sidelying Rolling: Min assist Sidelying to sit: Min assist Sit to supine: Mod assist Sit to sidelying: Mod assist General bed mobility comments: minA to BLE to move to EOB, modA to return to bed. pt with good use of BUE to elevate trunk Transfers Overall transfer level: Needs assistance Equipment used: None Transfers: Lateral/Scoot Transfers Sit to Stand: Max assist, +2 physical assistance  Lateral/Scoot Transfers: Mod assist General transfer comment: modA to scoot along EOB, only completed x1 due to pain and inability to position BLE in neutral to wt bear Ambulation/Gait General Gait Details: pt unable    ADL: ADL Overall ADL's : Needs assistance/impaired Eating/Feeding: Set up, With adaptive utensils Grooming: Set up, Sitting, Brushing hair Grooming Details (indicate cue type and reason): assist to open toothpaste tube (remove cap) but able to place toothpaste on toothbrush without difficulty. Did request assist to brush back of hair for thoroughness but pt likely able to do this Upper Body Bathing: Bed level, Minimal assistance Lower Body Bathing: Maximal assistance, Bed level Lower Body Bathing Details (indicate cue type and reason): to bathe peri region after purewick malfunction. able to assist well in rolling Upper Body Dressing : Minimal assistance, Bed level Lower Body Dressing: Maximal assistance, Bed level Lower Body Dressing Details (indicate cue type and reason): for ankle splints and tennis shoes. Guided pt in lateral leans sitting EOB and reaching to bottom to simulate LB dressing techniques Toilet Transfer: Moderate assistance, +2 for physical assistance, Transfer board Toileting- Clothing Manipulation and Hygiene: Total assistance, Bed level Functional  mobility during ADLs: +2 for physical assistance, Moderate assistance General ADL Comments: Session focused on UE HEP education with handout provided, lateral leans and balance sitting EOB and sliding board transfer to wheelchair. Provided handout for theraputty exercises as well.  Cognition: Cognition Overall Cognitive Status: Within Functional Limits for tasks assessed Orientation Level: Oriented X4 Cognition Arousal/Alertness: Awake/alert Behavior During Therapy: WFL for tasks assessed/performed Overall Cognitive Status: Within Functional Limits for tasks assessed General Comments: pt able to follow all commands and demo good safety awareness and recall of techniques   Blood pressure 116/66, pulse 83, temperature 97.9 F (36.6 C), temperature source Oral, resp. rate 12, height 5' 4"  (1.626 m), weight 64 kg, last menstrual  period 04/20/2005, SpO2 100 %. Physical Exam Vitals and nursing note reviewed.  Constitutional:      General: She is not in acute distress.    Appearance: Normal appearance. She is normal weight. She is not ill-appearing.  HENT:     Head: Normocephalic and atraumatic.     Right Ear: External ear normal.     Left Ear: External ear normal.     Nose: Nose normal.  Eyes:     General:        Right eye: No discharge.        Left eye: No discharge.     Extraocular Movements: Extraocular movements intact.  Neck:     Comments: Trach stoma well healed and closed.  Cardiovascular:     Rate and Rhythm: Normal rate and regular rhythm.  Pulmonary:     Effort: Pulmonary effort is normal. No respiratory distress.     Breath sounds: Normal breath sounds. No stridor.  Abdominal:     General: Abdomen is flat. Bowel sounds are normal. There is no distension.  Musculoskeletal:     Cervical back: Normal range of motion and neck supple.     Comments: No edema or tenderness in extremities  Skin:    General: Skin is warm and dry.  Neurological:     Mental Status: She is  alert and oriented to person, place, and time.     Comments: Alert and oriented Motor: Bilateral upper extremities: 4+/5 proximal distal Bilateral lower extremities: Flexion, knee extension 4/5, ankle dorsiflexion 4 -/5 Sensation intact to light touch bilateral feet and fingertips  Psychiatric:        Mood and Affect: Mood normal.        Behavior: Behavior normal.    Results for orders placed or performed during the hospital encounter of 03/13/21 (from the past 48 hour(s))  Basic metabolic panel     Status: Abnormal   Collection Time: 03/19/21  1:24 AM  Result Value Ref Range   Sodium 132 (L) 135 - 145 mmol/L   Potassium 3.7 3.5 - 5.1 mmol/L   Chloride 99 98 - 111 mmol/L   CO2 26 22 - 32 mmol/L   Glucose, Bld 98 70 - 99 mg/dL    Comment: Glucose reference range applies only to samples taken after fasting for at least 8 hours.   BUN 6 (L) 8 - 23 mg/dL   Creatinine, Ser 0.41 (L) 0.44 - 1.00 mg/dL   Calcium 8.6 (L) 8.9 - 10.3 mg/dL   GFR, Estimated >60 >60 mL/min    Comment: (NOTE) Calculated using the CKD-EPI Creatinine Equation (2021)    Anion gap 7 5 - 15    Comment: Performed at [facility] [address] [place], [place] [address]   No results found.     Medical Problem List and Plan: 1.  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  -patient may shower  -ELOS/Goals: 17-20 days/min a  Admit to CIR 2.  Antithrombotics: -DVT/anticoagulation:  Pharmaceutical: Other (comment) --Eliquis  -antiplatelet therapy: N/A 3. Pain Management: Oxycodone  prn.   Monitor with increased activity comfortably for neuropathic pain 4. Mood: LCSW to follow for evaluation and support.   -antipsychotic agents: N/A 5. Neuropsych: This patient is capable of making decisions on her own behalf. 6. Skin/Wound Care: Routine pressure relief measures.  7. Fluids/Electrolytes/Nutrition: Monitor I/Os.  Does not like nutritional  supplements.  --Will add Juven for low calorie malnutrition.  CMP ordered 8. A flutter s/p DCCV: No in NSR on amiodarone and Eliquis  --Monitor HR/BP on TID basis  Monitor with increased activity 9. Hypokalemia: Continue K dur for supplement  CMP ordered 10. Neurogenic bladder: Monitor voiding with PVRs/bladder scans,  --Continue Urecholine TID 11. Neurogenic bowel?: Has been incontinent of bowel--last BM yesterday per pt.    --Has been refusing laxative-->will make it prn.  12. Hyponatremia: Continues to vary from 131-134.    CMP ordered  Bary Leriche, PA-C 03/20/2021  I have personally performed a face to face diagnostic evaluation, including, but not limited to relevant history and physical exam findings, of this patient and developed relevant assessment and plan.  Additionally, I have reviewed and concur with the physician assistant's documentation above.  Delice Lesch, MD, ABPMR  The patient's status has not changed. Any changes from the pre-admission screening or documentation from the acute chart are noted above.   Delice Lesch, MD, ABPMR

## 2021-03-20 NOTE — TOC Transition Note (Signed)
Transition of Care (TOC) - CM/SW Discharge Note Marvetta Gibbons RN, BSN Transitions of Care Unit 4E- RN Case Manager See Treatment Team for direct phone #    Patient Details  Name: Angela Morales MRN: [id number] Date of Birth: 09-01-1945  Transition of Care [facility]) CM/SW Contact:  Dawayne Patricia, RN Phone Number: 03/20/2021, 11:14 AM   Clinical Narrative:    Have been notified this am by Urban Gibson with [facility] INPT rehab that insurance has given approval for return to CIR and bed available today.  MD has cleared pt for return to [facility] rehab.  Pt to transition back to [facility] rehab later today.    Final next level of care: IP Rehab Facility Barriers to Discharge: Barriers Resolved, Insurance Authorization   Patient Goals and CMS Choice Patient states their goals for this hospitalization and ongoing recovery are:: return to [facility] rehab      Discharge Placement               INPT rehab        Discharge Plan and Services     Post Acute Care Choice: IP Rehab                               Social Determinants of Health (SDOH) Interventions     Readmission Risk Interventions Readmission Risk Prevention Plan 03/20/2021  Transportation Screening Complete  PCP or Specialist Appt within 5-7 Days Not Complete  Not Complete comments returning to [place] Screening Complete  Medication Review (RN CM) Complete  Some recent data might be hidden

## 2021-03-21 DIAGNOSIS — E46 Unspecified protein-calorie malnutrition: Secondary | ICD-10-CM

## 2021-03-21 DIAGNOSIS — N319 Neuromuscular dysfunction of bladder, unspecified: Secondary | ICD-10-CM

## 2021-03-21 DIAGNOSIS — E871 Hypo-osmolality and hyponatremia: Secondary | ICD-10-CM

## 2021-03-21 DIAGNOSIS — G610 Guillain-Barre syndrome: Principal | ICD-10-CM

## 2021-03-21 LAB — CBC WITH DIFFERENTIAL/PLATELET
Abs Immature Granulocytes: 0.01 10*3/uL (ref 0.00–0.07)
Basophils Absolute: 0.1 10*3/uL (ref 0.0–0.1)
Basophils Relative: 1 %
Eosinophils Absolute: 0.2 10*3/uL (ref 0.0–0.5)
Eosinophils Relative: 2 %
HCT: 39 % (ref 36.0–46.0)
Hemoglobin: 12.8 g/dL (ref 12.0–15.0)
Immature Granulocytes: 0 %
Lymphocytes Relative: 47 %
Lymphs Abs: 2.9 10*3/uL (ref 0.7–4.0)
MCH: 31.8 pg (ref 26.0–34.0)
MCHC: 32.8 g/dL (ref 30.0–36.0)
MCV: 97 fL (ref 80.0–100.0)
Monocytes Absolute: 0.7 10*3/uL (ref 0.1–1.0)
Monocytes Relative: 12 %
Neutro Abs: 2.4 10*3/uL (ref 1.7–7.7)
Neutrophils Relative %: 38 %
Platelets: 216 10*3/uL (ref 150–400)
RBC: 4.02 MIL/uL (ref 3.87–5.11)
RDW: 14.5 % (ref 11.5–15.5)
WBC: 6.3 10*3/uL (ref 4.0–10.5)
nRBC: 0 % (ref 0.0–0.2)

## 2021-03-21 LAB — COMPREHENSIVE METABOLIC PANEL
ALT: 17 U/L (ref 0–44)
AST: 27 U/L (ref 15–41)
Albumin: 2.6 g/dL — ABNORMAL LOW (ref 3.5–5.0)
Alkaline Phosphatase: 69 U/L (ref 38–126)
Anion gap: 6 (ref 5–15)
BUN: 7 mg/dL — ABNORMAL LOW (ref 8–23)
CO2: 27 mmol/L (ref 22–32)
Calcium: 8.8 mg/dL — ABNORMAL LOW (ref 8.9–10.3)
Chloride: 100 mmol/L (ref 98–111)
Creatinine, Ser: 0.45 mg/dL (ref 0.44–1.00)
GFR, Estimated: >60 mL/min (ref >60)
Glucose, Bld: 97 mg/dL (ref 70–99)
Potassium: 4.2 mmol/L (ref 3.5–5.1)
Sodium: 133 mmol/L — ABNORMAL LOW (ref 135–145)
Total Bilirubin: 0.5 mg/dL (ref 0.3–1.2)
Total Protein: 6.7 g/dL (ref 6.5–8.1)

## 2021-03-21 NOTE — Progress Notes (Signed)
PROGRESS NOTE   Subjective/Complaints: Pt was a little restless last night being in a new place. Not much appetite. Feet/legs can be painful at times but oxycodone is effective.  ROS: Patient denies fever, rash, sore throat, blurred vision, nausea, vomiting, diarrhea, cough, shortness of breath or chest pain,  headache, or mood change.    Objective:   No results found. Recent Labs    03/21/21 0635  WBC 6.3  HGB 12.8  HCT 39.0  PLT 216   Recent Labs    03/19/21 0124 03/21/21 0635  NA 132* 133*  K 3.7 4.2  CL 99 100  CO2 26 27  GLUCOSE 98 97  BUN 6* 7*  CREATININE 0.41* 0.45  CALCIUM 8.6* 8.8*    Intake/Output Summary (Last 24 hours) at 03/21/2021 0842 Last data filed at 03/20/2021 1900 Gross per 24 hour  Intake 336 ml  Output --  Net 336 ml        Physical Exam: Vital Signs Blood pressure 124/71, pulse 79, temperature 98.1 F (36.7 C), temperature source Oral, resp. rate 16, height 5' 4"  (1.626 m), last menstrual period 04/20/2005, SpO2 96 %.  General: Alert and oriented x 3, No apparent distress HEENT: Head is normocephalic, atraumatic, PERRLA, EOMI, sclera anicteric, oral mucosa pink and moist, dentition intact, ext ear canals clear,  Neck: Supple without JVD or lymphadenopathy, trach stoma healed Heart: Reg rate and rhythm. No murmurs rubs or gallops Chest: CTA bilaterally without wheezes, rales, or rhonchi; no distress Abdomen: Soft, non-tender, non-distended, bowel sounds positive. Extremities: No clubbing, cyanosis, or edema. Pulses are 2+ Psych: Pt's affect is appropriate. Pt is cooperative Skin: Clean and intact without signs of breakdown Neuro:  Alert and oriented x 3. Normal insight and awareness. Intact Memory. Normal language and speech. Cranial nerve exam unremarkable. UE 4+/5. LE: 4/5 prox to 3+ to 4-/5 distally. Stocking glove sensory loss below knees. DTR's absent Musculoskeletal: Full  ROM, No pain with AROM or PROM in the neck, trunk, or extremities. Posture appropriate     Assessment/Plan: 1. Functional deficits which require 3+ hours per day of interdisciplinary therapy in a comprehensive inpatient rehab setting. Physiatrist is providing close team supervision and 24 hour management of active medical problems listed below. Physiatrist and rehab team continue to assess barriers to discharge/monitor patient progress toward functional and medical goals  Care Tool:  Bathing              Bathing assist       Upper Body Dressing/Undressing Upper body dressing   What is the patient wearing?: Hospital gown only    Upper body assist Assist Level: Maximal Assistance - Patient 25 - 49%    Lower Body Dressing/Undressing Lower body dressing            Lower body assist       Toileting Toileting    Toileting assist Assist for toileting: Total Assistance - Patient < 25%     Transfers Chair/bed transfer  Transfers assist     Chair/bed transfer assist level: 2 Helpers     Locomotion Ambulation   Ambulation assist  Walk 10 feet activity   Assist           Walk 50 feet activity   Assist           Walk 150 feet activity   Assist           Walk 10 feet on uneven surface  activity   Assist           Wheelchair     Assist               Wheelchair 50 feet with 2 turns activity    Assist            Wheelchair 150 feet activity     Assist          Blood pressure 124/71, pulse 79, temperature 98.1 F (36.7 C), temperature source Oral, resp. rate 16, height 5' 4"  (1.626 m), last menstrual period 04/20/2005, SpO2 96 %.  Medical Problem List and Plan: 1.  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             -patient may shower             -ELOS/Goals: 17-20 days/min a             Patient is beginning CIR therapies  today including PT and OT  2.  Antithrombotics: -DVT/anticoagulation:  Pharmaceutical: Other (comment) --Eliquis             -antiplatelet therapy: N/A 3. Pain Management: Oxycodone  prn. Using every other day at this point             -controlled 4. Mood: LCSW to follow for evaluation and support.              -antipsychotic agents: N/A 5. Neuropsych: This patient is capable of making decisions on her own behalf. 6. Skin/Wound Care: Routine pressure relief measures.  7. Fluids/Electrolytes/Nutrition: Monitor I/Os.  Does not like nutritional supplements.  --added Juven for low cal/protein malnutrition.               -CMPreviewed   -will ask RD to see for recs 8. A flutter s/p DCCV: No in NSR on amiodarone and Eliquis             --Monitor HR/BP on TID basis             Monitor with increased activity 9. Hypokalemia: Continue K dur for supplement             CMP ordered 10. Neurogenic bladder: Monitor voiding with PVRs/bladder scans,             --Continue Urecholine TID  -need Scans 11. Neurogenic bowel?: Has been incontinent of bowel--last BM yesterday per pt.               --  laxative--  prn.  12. Hyponatremia: Continues to vary from 131-134.               9/24: 133       LOS: 1 days A FACE TO FACE EVALUATION WAS PERFORMED  Meredith Staggers 03/21/2021, 8:42 AM

## 2021-03-21 NOTE — Evaluation (Signed)
Physical Therapy Assessment and Plan  Patient Details  Name: Angela Morales MRN: [id number] Date of Birth: [birth year]  PT Diagnosis: Abnormal posture, Difficulty walking, Impaired sensation, Low back pain, and Paraplegia Rehab Potential: Good ELOS: 2-3 weeks   Today's Date: 03/21/2021 PT Individual Time: 0800-0900, 1141-1200 PT Individual Time Calculation (min): 60 min, 19 min    Hospital Problem: Principal Problem:   GBS (Guillain-Barre syndrome) ([place])   Past Medical History:  Past Medical History:  Diagnosis Date   Abnormal uterine bleeding    on HRT   Allergy    seasonal   Arthritis    Atrial fibrillation (HCC)    Celiac disease    DDD (degenerative disc disease)    Dysrhythmia    Glaucoma    Microscopic colitis    Osteoarthritis of both knees    PONV (postoperative nausea and vomiting)    SVT (supraventricular tachycardia) ([place])    Past Surgical History:  Past Surgical History:  Procedure Laterality Date   APPENDECTOMY     ATRIAL FIBRILLATION ABLATION N/A 01/28/2021   Procedure: ATRIAL FIBRILLATION ABLATION;  Surgeon: Constance Haw, MD;  Location: [facility];  Service: Cardiovascular;  Laterality: N/A;   CARDIOVERSION N/A 10/30/2020   Procedure: CARDIOVERSION;  Surgeon: Jerline Pain, MD;  Location: [facility] ENDOSCOPY;  Service: Cardiovascular;  Laterality: N/A;   CARDIOVERSION N/A 03/16/2021   Procedure: CARDIOVERSION;  Surgeon: Thayer Headings, MD;  Location: [facility] ENDOSCOPY;  Service: Cardiovascular;  Laterality: N/A;   COLONOSCOPY     FOOT FRACTURE SURGERY  10/2013   TONSILLECTOMY AND ADENOIDECTOMY     TOTAL KNEE ARTHROPLASTY Left 03/22/2016   Procedure: TOTAL KNEE ARTHROPLASTY;  Surgeon: Dorna Leitz, MD;  Location: [facility];  Service: Orthopedics;  Laterality: Left;   TUBAL LIGATION  1981   VAGINAL HYSTERECTOMY  04/20/05   prolapse, adenomyosis    Assessment & Plan Clinical Impression: Angela Morales. 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 She was treated with IVIG but continued to worsen with aspiration event requiring intubation for hypoxic hypercarbic respiratory failure. She was treated for aspiration pneumonia, but hospital course significant for A fib with RVR, progressive hyponatremia, early acute cholecystitis as well as hypotension requiring pressures. She required tracheostomy prior to wean to ATC and ultimately decannulated by 09/06.  She continued to have significant impairments in mobility and ADLs and was admitted to CIR on 03/04/2021. She was making gains but developed A. flutter with hypotension on 03/13/2021 requiring discharge to acute hospital for telemetry/management. She was started on amiodarone drip but continued to have issues with A flutter. She  underwent DC cardioversion on by Dr. Acie Fredrickson on 03/16/2021 and remains in NSR. Therapy resumed and she continues to be limited by bilateral lower extremity weakness, increased tone, balance and postural deficits. CIR recommended due to functional decline.    Patient transferred to CIR on 03/20/2021 .   Patient currently requires max with mobility secondary to muscle weakness, decreased coordination and decreased motor planning, and decreased sitting balance, decreased standing balance, and decreased postural control.  Prior to hospitalization, patient was independent  with mobility and lived with Spouse, Son in a House home.  Home access is 3-4Stairs to enter.  Patient will benefit from skilled PT intervention to maximize safe functional mobility, minimize fall risk,  and decrease caregiver burden for planned discharge home with 24 hour assist.  Anticipate patient will benefit from follow up [place] at discharge.  PT - End of Session Activity Tolerance: Tolerates 10 - 20 min activity with multiple rests Endurance  Deficit: Yes Endurance Deficit Description: Pt requires rest breaks and fatigues quickly PT Assessment Rehab Potential (ACUTE/IP ONLY): Good PT Barriers to Discharge: [place] home environment;Neurogenic Bowel & Bladder PT Patient demonstrates impairments in the following area(s): Balance;Safety;Sensory;Endurance;Motor;Pain PT Locomotion Functional Problem(s): Wheelchair Mobility;Stairs PT Plan PT Intensity: Minimum of 1-2 x/day ,45 to 90 minutes PT Frequency: 5 out of 7 days PT Duration Estimated Length of Stay: 2-3 weeks PT Treatment/Interventions: Neuromuscular re-education;Stair training;UE/LE Strength taining/ROM;Wheelchair propulsion/positioning;Ambulation/gait training;Balance/vestibular training;Discharge planning;Therapeutic Activities;UE/LE Coordination activities;Functional mobility training;Patient/family education;Therapeutic Exercise;Psychosocial support;DME/adaptive equipment instruction;Community reintegration PT Transfers Anticipated Outcome(s): min/CGA PT Locomotion Anticipated Outcome(s): min A gait, w/c supervision PT Recommendation Recommendations for Other Services: Neuropsych consult;Therapeutic Recreation consult Therapeutic Recreation Interventions: Stress management Follow Up Recommendations: Home health PT Patient destination: Home Equipment Recommended: To be determined   PT Evaluation Precautions/Restrictions Precautions Precautions: Fall Required Braces or Orthoses: Other Brace Other Brace: bilateral ankle air splints, PRAFOs Restrictions Weight Bearing Restrictions: No General PT Amount of Missed Time (min): 11 Minutes PT Missed Treatment Reason: Nursing care Vital Signs  Pain   Pain Interference   Home Living/Prior Functioning Home Living Available Help at Discharge: Family;Available PRN/intermittently Type of Home: House Home Access: Stairs to enter Entrance Stairs-Number of Steps: 3-4 Home Layout: Multi-level;Able to live on main level  with bedroom/bathroom;Full bath on main level Bathroom Shower/Tub: Tub/shower unit;Walk-in shower Bathroom Toilet: Standard Bathroom Accessibility: Yes  Lives With: Spouse;Son Prior Function Level of Independence: Independent with basic ADLs;Independent with homemaking with ambulation Driving: Yes Vision/Perception  Vision - History Ability to See in Adequate Light: 0 Adequate Perception Perception: Within Functional Limits Praxis Praxis: Intact  Cognition Overall Cognitive Status: Within Functional Limits for tasks assessed Arousal/Alertness: Awake/alert Year: 2022 Month: September Day of Week: Correct Sustained Attention: Appears intact Memory: Appears intact Immediate Memory Recall: Sock;Blue;Bed Memory Recall Sock: Without Cue Memory Recall Blue: Without Cue Memory Recall Bed: Without Cue Awareness: Appears intact Problem Solving: Appears intact Safety/Judgment: Appears intact Sensation Sensation Light Touch: Impaired Detail Light Touch Impaired Details: Impaired RLE;Impaired LLE;Impaired RUE;Impaired LUE (numbness in fingertips bilaterally, decreased appreciation to light touch in LEs but able to localize with testing excluding dorsal aspects of toes on the Rt foot) Coordination Gross Motor Movements are Fluid and Coordinated: No Fine Motor Movements are Fluid and Coordinated: No Coordination and Movement Description: Affected by paraplegia and arthritic changes/limitations of hands Finger Nose Finger Test: Mild dysmetria bilaterally Motor  Motor Motor: Abnormal postural alignment and control;Other (comment) (Simultaneous filing. User may not have seen previous data.) Motor - Skilled Clinical Observations: Decr LE stength   Trunk/Postural Assessment  Cervical Assessment Cervical Assessment: Exceptions to [facility] Thoracic Assessment Thoracic Assessment: Exceptions to [facility] Lumbar Assessment Lumbar Assessment: Exceptions to [facility] Postural Control Postural Control:  Deficits on evaluation Trunk Control: requires UE support, able to maintain w/o UE support x 30 seconds  Balance Balance Balance Assessed: Yes Static Sitting Balance Static Sitting - Balance Support: Bilateral upper extremity supported;Feet supported Static Sitting - Level of Assistance: 5: Stand by assistance Dynamic Sitting Balance Sitting balance - Comments: Min A repositioning EOB before lying down in bed Static Standing Balance Static Standing - Balance Support: Bilateral upper extremity supported Static Standing - Level of Assistance: 1: +2 Total assist Extremity Assessment  RUE  Assessment RUE Assessment: Within Functional Limits Active Range of Motion (AROM) Comments: WNL General Strength Comments: 4-/5 grossly LUE Assessment LUE Assessment: Within Functional Limits Active Range of Motion (AROM) Comments: WNL General Strength Comments: 4-/5 grossly      Care Tool Care Tool Bed Mobility Roll left and right activity   Roll left and right assist level: Minimal Assistance - Patient > 75%    Sit to lying activity   Sit to lying assist level: Moderate Assistance - Patient 50 - 74%    Lying to sitting on side of bed activity   Lying to sitting on side of bed assist level: the ability to move from lying on the back to sitting on the side of the bed with no back support.: Moderate Assistance - Patient 50 - 74%     Care Tool Transfers Sit to stand transfer   Sit to stand assist level: 2 Helpers    Chair/bed transfer   Chair/bed transfer assist level: Moderate Assistance - Patient 50 - 74% Chair/bed transfer assistive device: Sliding board   Toilet transfer   Assist Level: 2 Helpers (using Stedy)    Scientist, product/process development transfer activity did not occur: Safety/medical concerns        Care Tool Locomotion Ambulation Ambulation activity did not occur: Safety/medical concerns        Walk 10 feet activity Walk 10 feet activity did not occur: Safety/medical concerns        Walk 50 feet with 2 turns activity Walk 50 feet with 2 turns activity did not occur: Safety/medical concerns      Walk 150 feet activity Walk 150 feet activity did not occur: Safety/medical concerns      Walk 10 feet on uneven surfaces activity Walk 10 feet on uneven surfaces activity did not occur: Safety/medical concerns      Stairs Stair activity did not occur: Safety/medical concerns        Walk up/down 1 step activity Walk up/down 1 step or curb (drop down) activity did not occur: Safety/medical concerns     Walk up/down 4 steps activity did not occuR: Safety/medical concerns  Walk up/down 4 steps activity      Walk up/down 12 steps activity Walk up/down 12 steps activity did not occur: Safety/medical concerns      Pick up small objects from floor Pick up small object from the floor (from standing position) activity did not occur: Safety/medical concerns      Wheelchair Is the patient using a wheelchair?: Yes Type of Wheelchair: Manual Wheelchair activity did not occur: Safety/medical concerns      Wheel 50 feet with 2 turns activity Wheelchair 50 feet with 2 turns activity did not occur: Safety/medical concerns    Wheel 150 feet activity Wheelchair 150 feet activity did not occur: Safety/medical concerns      Refer to Care Plan for Long Term Goals  SHORT TERM GOAL WEEK 1 PT Short Term Goal 1 (Week 1): Pt will perform sit to stand with mod A of 1 PT Short Term Goal 2 (Week 1): Pt will perform SB transfer with min A PT Short Term Goal 3 (Week 1): Pt will initiate pre-gait activity with LRAD  Recommendations for other services: Neuropsych and Therapeutic Recreation  Stress management  Skilled Therapeutic Intervention Mobility Bed Mobility Rolling Right: Minimal Assistance - Patient > 75% Right Sidelying to Sit: Moderate Assistance - Patient 50-74% Sit to Sidelying Right: Moderate Assistance - Patient 50-74% Transfers Transfers: Lateral/Scoot Transfers;Sit to  Stand Sit to Stand: 2 Helpers Lateral/Scoot Transfers: Moderate Assistance - Patient 50-74% Transfer (Assistive device): Other (Comment) (slide board) Transfer via Lift Equipment: Animal nutritionist: No Gait Gait: No Stairs / Additional Locomotion Stairs: No Product manager Mobility: No  Session 1: Evaluation completed (see details above and below) with education on PT POC and goals and individual treatment initiated with focus on  evaluating bed mobility, standing, and transfers. pt received in bed and agreeable to therapy. No complaint of pain. Strength and sensation testing performed in supine as noted above. Donned shoes and air casts on BIL ankles tot A. Bed mobility with mod A for LE management and trunk control. Pt able to sit EOB with CGA fading to supervision. Pt performed Sit to stand with stedy x2 with max A x2 fading mod A x2 on second trial. Pt then performed slideboard transfer with mod A of 1, +2 for safety but did not assist. Pt remained in TIS at end of session and was left with all needs in reach and alarm active.   Session 2: Pt missed 11 min of scheduled time for I/O cath with nsg. No complaint of pain on arrival, but reported some premorbid low back and hip pain with mobility, stating she had not taken pain medicine today. Addressed with rest and positioning throughout. Supine>sit with mod A. Donned shoes tot A. Pt directed in marches x 10 for LE strength and endurance. Pt then performed scooting forward and back for improved pelvic control and core strength. Lateral scooting x 3 BIL with mod A to clear bottom for improved transfers. Pt returned to supine in the same manner as above and was left with all needs in reach and alarm active.    Discharge Criteria: Patient will be discharged from PT if patient refuses treatment 3 consecutive times without medical reason, if treatment goals not met, if there is a change in medical status, if patient  makes no progress towards goals or if patient is discharged from hospital.  The above assessment, treatment plan, treatment alternatives and goals were discussed and mutually agreed upon: by patient  Mickel Fuchs 03/21/2021, 12:59 PM

## 2021-03-21 NOTE — Plan of Care (Signed)
  Problem: RH Bathing Goal: LTG Patient will bathe all body parts with assist levels (OT) Description: LTG: Patient will bathe all body parts with assist levels (OT) Flowsheets (Taken 03/21/2021 1236) LTG: Pt will perform bathing with assistance level/cueing: Minimal Assistance - Patient > 75%   Problem: RH Dressing Goal: LTG Patient will perform lower body dressing w/assist (OT) Description: LTG: Patient will perform lower body dressing with assist, with/without cues in positioning using equipment (OT) Flowsheets (Taken 03/21/2021 1236) LTG: Pt will perform lower body dressing with assistance level of: Minimal Assistance - Patient > 75%   Problem: RH Toileting Goal: LTG Patient will perform toileting task (3/3 steps) with assistance level (OT) Description: LTG: Patient will perform toileting task (3/3 steps) with assistance level (OT)  Flowsheets (Taken 03/21/2021 1236) LTG: Pt will perform toileting task (3/3 steps) with assistance level: Minimal Assistance - Patient > 75%   Problem: RH Toilet Transfers Goal: LTG Patient will perform toilet transfers w/assist (OT) Description: LTG: Patient will perform toilet transfers with assist, with/without cues using equipment (OT) Flowsheets (Taken 03/21/2021 1236) LTG: Pt will perform toilet transfers with assistance level of: Minimal Assistance - Patient > 75%   Problem: RH Tub/Shower Transfers Goal: LTG Patient will perform tub/shower transfers w/assist (OT) Description: LTG: Patient will perform tub/shower transfers with assist, with/without cues using equipment (OT) Flowsheets (Taken 03/21/2021 1236) LTG: Pt will perform tub/shower stall transfers with assistance level of: Minimal Assistance - Patient > 75%

## 2021-03-22 MED ORDER — AMIODARONE HCL 200 MG PO TABS
200.0000 mg | ORAL_TABLET | Freq: Every day | ORAL | Status: DC
  Administered 2021-03-31 – 2021-04-08 (×9): 200 mg via ORAL
  Filled 2021-03-22 (×9): qty 1

## 2021-03-22 MED ORDER — METHOCARBAMOL 500 MG PO TABS
500.0000 mg | ORAL_TABLET | Freq: Three times a day (TID) | ORAL | Status: DC | PRN
  Administered 2021-03-27 – 2021-03-31 (×5): 500 mg via ORAL
  Filled 2021-03-22 (×6): qty 1

## 2021-03-22 MED ORDER — BOOST / RESOURCE BREEZE PO LIQD CUSTOM
1.0000 | Freq: Four times a day (QID) | ORAL | Status: DC
  Administered 2021-03-22 – 2021-04-07 (×10): 1 via ORAL

## 2021-03-22 MED ORDER — PROSOURCE PLUS PO LIQD
30.0000 mL | Freq: Two times a day (BID) | ORAL | Status: DC
  Administered 2021-03-22 – 2021-03-28 (×8): 30 mL via ORAL
  Filled 2021-03-22 (×9): qty 30

## 2021-03-22 NOTE — Progress Notes (Signed)
Nutrition Follow-up  DOCUMENTATION CODES:   Not applicable  INTERVENTION:   - Please obtain CIR admission weight  - Boost Breeze po QID, each supplement provides 250 kcal and 9 grams of protein  - ProSource Plus 30 ml po BID, each supplement provides 100 kcal and 15 grams of protein  - Encourage PO intake  - d/c Juven  NUTRITION DIAGNOSIS:   Increased nutrient needs related to other (CIR therapies) as evidenced by estimated needs.  GOAL:   Patient will meet greater than or equal to 90% of their needs  MONITOR:   PO intake, Supplement acceptance, Labs, Weight trends  REASON FOR ASSESSMENT:   Consult Poor PO  ASSESSMENT:   75 year old female with PMH of severe lumbar scoliosis, celiac disease, atrial fibrillation, SVT. 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 Pt was treated with IVIG but continued to worsen with aspiration event requiring intubation. Pt required tracheostomy, was weaned to ATC, and was ultimately decannulated by 9/06. Pt continued to have significant impairments in mobility and ADLs and was admitted to CIR on 03/04/21. Pt was making progress but developed atrial flutter with hypotension on 03/13/21 requiring discharge to acute hospital for telemetry/management. Pt underwent DC cardioversion on 03/16/21 and remains in NSR. Pt re-admitted to CIR on 9/23.  RD working remotely. Consult received for poor PO intake. Pt previously had a Cortrak in place for tube feeds but this was removed during previous CIR admission as pt's PO intake was improving. Pt on a gluten-free diet secondary to Celiac disease.  Pt with recent diagnosis of moderate malnutrition. Suspect malnutrition persists but RD unable to confirm without NFPE. Will complete at follow-up.  RD attempted to reach pt via phone call to room; however, no answer. Reviewed RD notes from previous CIR admission. During that time, pt was consuming Boost  Breeze and ProSource Plus po supplements. Juven currently ordered by CIR PA. Will d/c this supplement as pt does not have any documented wounds at this time. Will order Boost Breeze and ProSource.  Reviewed weight history in chart. Last available weight is form 9/19. Recommend obtaining updated weight. Weight fairly stable with some fluctuations since February 2022. Noted pt with slow weight loss from 08/13/20 through 03/04/21 (from 64.9 kg to 61.6 kg). However, pt with weight gain up to 64.2 kg on 9/14. Weight gain is favorable in a pt with malnutrition. Will continue to monitor.  Meal Completion: 30-100%  Medications reviewed and include: Juven BID, protonix, klor-con 60 mEq daily  Labs reviewed: sodium 133  UOP: 1600 ml x 24 hours I/O's: -1.0 L since admit  NUTRITION - FOCUSED PHYSICAL EXAM:  Unable to complete. RD working remotely.  Diet Order:   Diet Order             Diet gluten free Room service appropriate? Yes; Fluid consistency: Thin  Diet effective now                   EDUCATION NEEDS:   No education needs have been identified at this time  Skin:  Skin Assessment: Reviewed RN Assessment  Last BM:  03/21/21 smear type 6  Height:   Ht Readings from Last 1 Encounters:  03/20/21 5' 4"  (1.626 m)    Weight:   Wt Readings from Last 1 Encounters:  03/16/21 64 kg    BMI:  Body mass index is 24.22 kg/m.  Estimated Nutritional Needs:   Kcal:  1800-2000  Protein:  90-110 grams  Fluid:  >/= 1.8 L    Gustavus Bryant, MS, RD, LDN Inpatient Clinical Dietitian Please see [facility] for contact information.

## 2021-03-22 NOTE — Progress Notes (Signed)
PROGRESS NOTE   Subjective/Complaints:  No issues overnite   ROS: Patient denies CP, SOB, N/V/D.    Objective:   No results found. Recent Labs    03/21/21 0635  WBC 6.3  HGB 12.8  HCT 39.0  PLT 216    Recent Labs    03/21/21 0635  NA 133*  K 4.2  CL 100  CO2 27  GLUCOSE 97  BUN 7*  CREATININE 0.45  CALCIUM 8.8*     Intake/Output Summary (Last 24 hours) at 03/22/2021 0858 Last data filed at 03/21/2021 2300 Gross per 24 hour  Intake 100 ml  Output 1600 ml  Net -1500 ml         Physical Exam: Vital Signs Blood pressure 117/66, pulse 76, temperature 97.7 F (36.5 C), temperature source Oral, resp. rate 16, height 5' 4"  (1.626 m), last menstrual period 04/20/2005, SpO2 96 %.   General: No acute distress Mood and affect are appropriate Heart: Regular rate and rhythm no rubs murmurs or extra sounds Lungs: Clear to auscultation, breathing unlabored, no rales or wheezes Abdomen: Positive bowel sounds, soft nontender to palpation, nondistended Extremities: No clubbing, cyanosis, or edema Skin: No evidence of breakdown, no evidence of rash  Neuro:  Alert and oriented x 3. Normal insight and awareness. Intact Memory. Normal language and speech. Cranial nerve exam unremarkable. UE 4+/5. LE: 4/5 prox to 3+ to 4-/5 distally. Stocking glove sensory loss below knees. DTR's absent Musculoskeletal: Full ROM, No pain with AROM or PROM in the neck, trunk, or extremities. Posture appropriate     Assessment/Plan: 1. Functional deficits which require 3+ hours per day of interdisciplinary therapy in a comprehensive inpatient rehab setting. Physiatrist is providing close team supervision and 24 hour management of active medical problems listed below. Physiatrist and rehab team continue to assess barriers to discharge/monitor patient progress toward functional and medical goals  Care Tool:  Bathing    Body parts  bathed by patient: Right arm, Left arm, Chest, Abdomen, Front perineal area, Right upper leg, Left upper leg, Face   Body parts bathed by helper: Buttocks, Right lower leg, Left lower leg     Bathing assist Assist Level: 2 Helpers (sit<stand using Stedy)     Upper Body Dressing/Undressing Upper body dressing   What is the patient wearing?: Pull over shirt    Upper body assist Assist Level: Set up assist    Lower Body Dressing/Undressing Lower body dressing      What is the patient wearing?: Pants, Incontinence brief     Lower body assist Assist for lower body dressing: Total Assistance - Patient < 25%     Toileting Toileting    Toileting assist Assist for toileting: Total Assistance - Patient < 25%     Transfers Chair/bed transfer  Transfers assist     Chair/bed transfer assist level: Moderate Assistance - Patient 50 - 74% Chair/bed transfer assistive device: Sliding board   Locomotion Ambulation   Ambulation assist   Ambulation activity did not occur: Safety/medical concerns          Walk 10 feet activity   Assist  Walk 10 feet activity did not occur: Safety/medical concerns  Walk 50 feet activity   Assist Walk 50 feet with 2 turns activity did not occur: Safety/medical concerns         Walk 150 feet activity   Assist Walk 150 feet activity did not occur: Safety/medical concerns         Walk 10 feet on uneven surface  activity   Assist Walk 10 feet on uneven surfaces activity did not occur: Safety/medical concerns         Wheelchair     Assist Is the patient using a wheelchair?: Yes Type of Wheelchair: Manual Wheelchair activity did not occur: Safety/medical concerns         Wheelchair 50 feet with 2 turns activity    Assist    Wheelchair 50 feet with 2 turns activity did not occur: Safety/medical concerns       Wheelchair 150 feet activity     Assist  Wheelchair 150 feet activity did not  occur: Safety/medical concerns       Blood pressure 117/66, pulse 76, temperature 97.7 F (36.5 C), temperature source Oral, resp. rate 16, height 5' 4"  (1.626 m), last menstrual period 04/20/2005, SpO2 96 %.  Medical Problem List and Plan: 1.  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             -patient may shower             -ELOS/Goals: 17-20 days/min a             Patient is beginning CIR therapies today including PT and OT  2.  Antithrombotics: -DVT/anticoagulation:  Pharmaceutical: Other (comment) --Eliquis             -antiplatelet therapy: N/A 3. Pain Management: Oxycodone  prn. Using every other day at this point             -controlled 4. Mood: LCSW to follow for evaluation and support.              -antipsychotic agents: N/A 5. Neuropsych: This patient is capable of making decisions on her own behalf. 6. Skin/Wound Care: Routine pressure relief measures.  7. Fluids/Electrolytes/Nutrition: Monitor I/Os.  Does not like nutritional supplements.  --added Juven for low cal/protein malnutrition.               -CMPreviewed   -will ask RD to see for recs 8. A flutter s/p DCCV: No in NSR on amiodarone and Eliquis             --Monitor HR/BP on TID basis             Monitor with increased activity 9. Hypokalemia: Continue K dur for supplement             CMP ordered 10. Neurogenic bladder: Monitor voiding with PVRs/bladder scans,             --Continue Urecholine TID  -need Scans Will switch flexeril to methocarbamol , d/c all anticholinergic agents  11. Neurogenic bowel?: Has been incontinent of bowel--last BM yesterday per pt.               --  laxative--  prn.  12. Hyponatremia: Continues to vary from 131-134.               9/24: 133       LOS: 2 days A FACE TO [place] E Oletta Buehring 03/22/2021, 8:58 AM

## 2021-03-23 LAB — BASIC METABOLIC PANEL
Anion gap: 7 (ref 5–15)
BUN: 7 mg/dL — ABNORMAL LOW (ref 8–23)
CO2: 28 mmol/L (ref 22–32)
Calcium: 8.9 mg/dL (ref 8.9–10.3)
Chloride: 98 mmol/L (ref 98–111)
Creatinine, Ser: 0.52 mg/dL (ref 0.44–1.00)
GFR, Estimated: >60 mL/min (ref >60)
Glucose, Bld: 109 mg/dL — ABNORMAL HIGH (ref 70–99)
Potassium: 3.7 mmol/L (ref 3.5–5.1)
Sodium: 133 mmol/L — ABNORMAL LOW (ref 135–145)

## 2021-03-23 LAB — CBC
HCT: 39.7 % (ref 36.0–46.0)
Hemoglobin: 12.7 g/dL (ref 12.0–15.0)
MCH: 31.2 pg (ref 26.0–34.0)
MCHC: 32 g/dL (ref 30.0–36.0)
MCV: 97.5 fL (ref 80.0–100.0)
Platelets: 235 10*3/uL (ref 150–400)
RBC: 4.07 MIL/uL (ref 3.87–5.11)
RDW: 14.6 % (ref 11.5–15.5)
WBC: 6.8 10*3/uL (ref 4.0–10.5)
nRBC: 0 % (ref 0.0–0.2)

## 2021-03-23 MED ORDER — GABAPENTIN 300 MG PO CAPS
300.0000 mg | ORAL_CAPSULE | Freq: Every day | ORAL | Status: DC
  Administered 2021-03-23 – 2021-04-07 (×16): 300 mg via ORAL
  Filled 2021-03-23 (×16): qty 1

## 2021-03-23 MED ORDER — BIOTENE DRY MOUTH MT LIQD: 15.0000 mL | OROMUCOSAL | Status: DC | PRN

## 2021-03-23 MED ORDER — BETHANECHOL CHLORIDE 25 MG PO TABS
25.0000 mg | ORAL_TABLET | Freq: Three times a day (TID) | ORAL | Status: DC
  Administered 2021-03-23 – 2021-04-03 (×32): 25 mg via ORAL
  Filled 2021-03-23 (×34): qty 1

## 2021-03-23 NOTE — Progress Notes (Signed)
Inpatient Rehabilitation  Patient information reviewed and entered into eRehab system by Ark Agrusa M. Oslo Huntsman, M.A., CCC/SLP, PPS Coordinator.  Information including medical coding, functional ability and quality indicators will be reviewed and updated through discharge.    

## 2021-03-23 NOTE — Progress Notes (Signed)
Physical Therapy Session Note  Patient Details  Name: Angela Morales MRN: [id number] Date of Birth: 09-15-1945  Today's Date: 03/23/2021 PT Individual Time: 0800-0915 PT Individual Time Calculation (min): 75 min   Short Term Goals: Week 1:  PT Short Term Goal 1 (Week 1): Pt will perform sit to stand with mod A of 1 PT Short Term Goal 2 (Week 1): Pt will perform SB transfer with min A PT Short Term Goal 3 (Week 1): Pt will initiate pre-gait activity with LRAD  Skilled Therapeutic Interventions/Progress Updates:    Pt received seated in bed, agreeable to PT session. No complaints of pain at rest, has some R hip pain with mobility. Pt is dependent to doff PRAFOs, don ankle air casts, don shoes, and don pants at bed level. Attempt to have pt bridge up to pull pants up over hips, unable to bridge. Supine to sit with mod A needed for some LE management and trunk elevation. Pt able to [facility] hospital gown and don shirt with setup A while seated EOB. Slide board transfer bed to w/c with mod A. Pt is setup A for oral hygiene while seated in w/c at sink. Slide board transfer to/from mat table with mod A. Sit to stand x 3 reps in stedy, initially with mod A increasing to max A needed with onset of fatigue. Standing mini-squats 2 x 5 reps with use of mirror for visual feedback for body positioning. Pt exhibits significant ankle inversion L>R in standing even with use of aircasts. Seated yoga block press-ups with manual assist for BLE weight bearing and ankle stability x 5 reps before onset of UE fatigue. Sit to supine mod A needed for BLE management. Supine B HS stretch 3 x 60 sec each. Attempt to position patient in long-sitting position, she has onset of R hip and low back pain, deferred position. Pt returned to sitting EOM with mod A needed for some LE management and trunk elevation. Returned to w/c via SB. Seated B gastroc/heel cord stretch 3 x 60 sec each. Pt would benefit from daily stretching of gastroc  due to tightness. Pt left semi-reclined in TIS chair in room with needs in reach, quick release belt and chair alarm in place at end of session.  Therapy Documentation Precautions:  Precautions Precautions: Fall Required Braces or Orthoses: Other Brace Other Brace: bilateral ankle air splints, PRAFOs Restrictions Weight Bearing Restrictions: No   Therapy/Group: Individual Therapy   Excell Seltzer, PT, DPT, CSRS  03/23/2021, 12:05 PM

## 2021-03-23 NOTE — Progress Notes (Addendum)
PROGRESS NOTE   Subjective/Complaints:  C/o dry mouth, esp in AM. Tries to take sips of water, but it doesn't seem to help. Otherwise doing fairly well.    ROS: Patient denies fever, rash, sore throat, blurred vision, nausea, vomiting, diarrhea, cough, shortness of breath or chest pain, joint or back pain, headache, or mood change.    Objective:   No results found. Recent Labs    03/21/21 0635  WBC 6.3  HGB 12.8  HCT 39.0  PLT 216   Recent Labs    03/21/21 0635  NA 133*  K 4.2  CL 100  CO2 27  GLUCOSE 97  BUN 7*  CREATININE 0.45  CALCIUM 8.8*    Intake/Output Summary (Last 24 hours) at 03/23/2021 3785 Last data filed at 03/22/2021 1923 Gross per 24 hour  Intake 262 ml  Output --  Net 262 ml        Physical Exam: Vital Signs Blood pressure 111/62, pulse 82, temperature 98.3 F (36.8 C), resp. rate 16, height 5' 4"  (1.626 m), last menstrual period 04/20/2005, SpO2 98 %.   Constitutional: No distress . Vital signs reviewed. HEENT: NCAT, EOMI, oral membranes appear a little dry Neck: supple Cardiovascular: RRR without murmur. No JVD    Respiratory/Chest: CTA Bilaterally without wheezes or rales. Normal effort    GI/Abdomen: BS +, non-tender, non-distended Ext: no clubbing, cyanosis, or edema Psych: pleasant and cooperative  Skin: No evidence of breakdown, no evidence of rash  Neuro:  Alert and oriented x 3. Normal insight and awareness. Intact Memory. Normal language and speech. Cranial nerve exam unremarkable. UE 4+/5. LE: 4/5 prox to 3+ to 4-/5 distally. Stocking glove sensory loss below knees bilaterally. DTR's absent. Neuro stable. Musculoskeletal: Full ROM, No pain with AROM or PROM in the neck, trunk, or extremities. Posture appropriate     Assessment/Plan: 1. Functional deficits which require 3+ hours per day of interdisciplinary therapy in a comprehensive inpatient rehab setting. Physiatrist  is providing close team supervision and 24 hour management of active medical problems listed below. Physiatrist and rehab team continue to assess barriers to discharge/monitor patient progress toward functional and medical goals  Care Tool:  Bathing    Body parts bathed by patient: Right arm, Left arm, Chest, Abdomen, Front perineal area, Right upper leg, Left upper leg, Face   Body parts bathed by helper: Buttocks, Right lower leg, Left lower leg     Bathing assist Assist Level: 2 Helpers (sit<stand using Stedy)     Upper Body Dressing/Undressing Upper body dressing   What is the patient wearing?: Pull over shirt    Upper body assist Assist Level: Set up assist    Lower Body Dressing/Undressing Lower body dressing      What is the patient wearing?: Incontinence brief     Lower body assist Assist for lower body dressing: Total Assistance - Patient < 25%     Toileting Toileting    Toileting assist Assist for toileting: Total Assistance - Patient < 25%     Transfers Chair/bed transfer  Transfers assist     Chair/bed transfer assist level: Moderate Assistance - Patient 50 - 74% Chair/bed transfer assistive device:  Sliding board   Locomotion Ambulation   Ambulation assist   Ambulation activity did not occur: Safety/medical concerns          Walk 10 feet activity   Assist  Walk 10 feet activity did not occur: Safety/medical concerns        Walk 50 feet activity   Assist Walk 50 feet with 2 turns activity did not occur: Safety/medical concerns         Walk 150 feet activity   Assist Walk 150 feet activity did not occur: Safety/medical concerns         Walk 10 feet on uneven surface  activity   Assist Walk 10 feet on uneven surfaces activity did not occur: Safety/medical concerns         Wheelchair     Assist Is the patient using a wheelchair?: Yes Type of Wheelchair: Manual Wheelchair activity did not occur:  Safety/medical concerns         Wheelchair 50 feet with 2 turns activity    Assist    Wheelchair 50 feet with 2 turns activity did not occur: Safety/medical concerns       Wheelchair 150 feet activity     Assist  Wheelchair 150 feet activity did not occur: Safety/medical concerns       Blood pressure 111/62, pulse 82, temperature 98.3 F (36.8 C), resp. rate 16, height 5' 4"  (1.626 m), last menstrual period 04/20/2005, SpO2 98 %.  Medical Problem List and Plan: 1.  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             -patient may shower             -ELOS/Goals: 17-20 days/min a             -Continue CIR therapies including PT, OT  2.  Antithrombotics: -DVT/anticoagulation:  Pharmaceutical: Other (comment) --Eliquis             -antiplatelet therapy: N/A 3. Pain Management: Oxycodone  prn. Using every other day at this point             -reduce gabapentin to 341m qhs d/t dry mouth 4. Mood: LCSW to follow for evaluation and support.              -antipsychotic agents: N/A 5. Neuropsych: This patient is capable of making decisions on her own behalf. 6. Skin/Wound Care: Routine pressure relief measures.  7. Fluids/Electrolytes/Nutrition: Monitor I/Os.  Does not like nutritional supplements.              -9/26 ate a little better this weekend, 9/24 labs ok  -appreciate RD recs! Continue recommended supps 8. A flutter s/p DCCV: No in NSR on amiodarone and Eliquis             --Monitor HR/BP on TID basis             Monitor with increased activity 9. Hypokalemia: Continue K dur for supplement             CMP reviewed 9/24 10. Neurogenic bladder: Monitor voiding with PVRs/bladder scans,             -9/26 still with urine retention, sporadic 50-1100cc -increase Urecholine to 288mbid    -reduce gabapentin 11. 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  12. Hyponatremia:  Continues to vary  from 131-134.               9/24: 133       LOS: 3 days A FACE TO FACE EVALUATION WAS PERFORMED  Meredith Staggers 03/23/2021, 8:38 AM

## 2021-03-23 NOTE — Progress Notes (Signed)
Patient ID: Angela Morales, female   DOB: Aug 03, 1945, 75 y.o.   MRN: [id number]  SW familiar with pt as pt is a return patient. Please refer to assessment completed on 03/05/2021. Discharge remains that she will d/c to home with support from her husband. Pt aware SW to f/u with her husband after team conference.   Loralee Pacas, MSW, [place] Office: [phone] Cell: [phone] Fax: [phone]

## 2021-03-23 NOTE — IPOC Note (Signed)
Overall Plan of Care [facility]) Patient Details Name: Angela Morales MRN: [id number] DOB: 1945/11/23  Admitting Diagnosis: GBS (Guillain-Barre syndrome) [facility])  Hospital Problems: Principal Problem:   GBS (Guillain-Barre syndrome) ([place])     Functional Problem List: Nursing Bladder, Edema, Endurance, Medication Management, Motor, Nutrition, Pain, Safety, Sensory  PT Balance, Safety, Sensory, Endurance, Motor, Pain  OT Balance, Endurance, Motor, Pain, Safety, Sensory  SLP    TR         Basic ADL's: OT Grooming, Bathing, Dressing, Toileting     Advanced  ADL's: OT Simple Meal Preparation     Transfers: PT    OT Toilet, Tub/Shower     Locomotion: PT Wheelchair Mobility, Stairs     Additional Impairments: OT None  SLP        TR      Anticipated Outcomes Item Anticipated Outcome  Self Feeding No goal  Swallowing      Basic self-care  Min A  Toileting  Min A   Bathroom Transfers Min A  Bowel/Bladder  min assist  Transfers  min/CGA  Locomotion  min A gait, w/c supervision  Communication     Cognition     Pain  < 3  Safety/Judgment  min assist and no falls   Therapy Plan: PT Intensity: Minimum of 1-2 x/day ,45 to 90 minutes PT Frequency: 5 out of 7 days PT Duration Estimated Length of Stay: 2-3 weeks OT Intensity: Minimum of 1-2 x/day, 45 to 90 minutes OT Frequency: 5 out of 7 days OT Duration/Estimated Length of Stay: 2 to 2.5 weeks     Due to the current state of emergency, patients may not be receiving their 3-hours of Medicare-mandated therapy.   Team Interventions: Nursing Interventions Patient/Family Education, Bladder Management, Disease Management/Prevention, Pain Management, Medication Management, Discharge Planning  PT interventions Neuromuscular re-education, Stair training, UE/LE Strength taining/ROM, Wheelchair propulsion/positioning, Ambulation/gait training, Training and development officer, Discharge planning, Therapeutic Activities,  UE/LE Coordination activities, Functional mobility training, Patient/family education, Therapeutic Exercise, Psychosocial support, DME/adaptive equipment instruction, Community reintegration  OT Interventions Balance/vestibular training, Discharge planning, Pain management, Self Care/advanced ADL retraining, Therapeutic Activities, UE/LE Coordination activities, Visual/perceptual remediation/compensation, Therapeutic Exercise, Functional mobility training, Patient/family education, Skin care/wound managment, Disease mangement/prevention, Community reintegration, Engineer, drilling, Neuromuscular re-education, Psychosocial support, Splinting/orthotics, UE/LE Strength taining/ROM, Wheelchair propulsion/positioning  SLP Interventions    TR Interventions    SW/CM Interventions Discharge Planning, Psychosocial Support, Patient/Family Education   Barriers to Discharge MD  Medical stability  Nursing Decreased caregiver support, Home environment access/layout, Incontinence, Neurogenic Bowel & Bladder, Lack of/limited family support, Medication compliance Lives in a multi-level home with 3-4 steps to enter. Left rail available. Able to live on main level with bedroom/bathroom. Spouse able to provide 24/7 assist at discharge. Other family available PRN/intermittently.  PT Inaccessible home environment, Neurogenic Bowel & Bladder    OT Incontinence    SLP      SW       Team Discharge Planning: Destination: PT-Home ,OT- Home , SLP-  Projected Follow-up: PT-Home health PT, OT-  Home health OT, SLP-  Projected Equipment Needs: PT-To be determined, OT- To be determined, SLP-  Equipment Details: PT- , OT-  Patient/family involved in discharge planning: PT- Patient,  OT-Patient, SLP-   MD ELOS: 14-18 days Medical Rehab Prognosis:  Excellent Assessment: The patient has been admitted for CIR therapies with the diagnosis of GBS. The team will be addressing functional mobility, strength,  stamina, balance, safety, adaptive techniques and equipment, self-care, bowel and bladder  mgt, patient and caregiver education, NMR, pain mgt, community reentry. Goals have been set at min assist for mobility and self-care.   Due to the current state of emergency, patients may not be receiving their 3 hours per day of Medicare-mandated therapy.    Meredith Staggers, MD, FAAPMR     See Team Conference Notes for weekly updates to the plan of care

## 2021-03-23 NOTE — Care Management (Signed)
[facility] Individual Statement of Services  Patient Name:  Angela Morales  Date:  03/23/2021  Welcome to the [facility].  Our goal is to provide you with an individualized program based on your diagnosis and situation, designed to meet your specific needs.  With this comprehensive rehabilitation program, you will be expected to participate in at least 3 hours of rehabilitation therapies Monday-Friday, with modified therapy programming on the weekends.  Your rehabilitation program will include the following services:  Physical Therapy (PT), Occupational Therapy (OT), 24 hour per day rehabilitation nursing, Therapeutic Recreaction (TR), Psychology, Neuropsychology, Care Coordinator, Rehabilitation Medicine, [place], and Other  Weekly team conferences will be held on Tuesdays to discuss your progress.  Your Inpatient Rehabilitation Care Coordinator will talk with you frequently to get your input and to update you on team discussions.  Team conferences with you and your family in attendance may also be held.  Expected length of stay: 2-3 weeks  Overall anticipated outcome: Minimal Assistance  Depending on your progress and recovery, your program may change. Your Inpatient Rehabilitation Care Coordinator will coordinate services and will keep you informed of any changes. Your Inpatient Rehabilitation Care Coordinator's name and contact numbers are listed  below.  The following services may also be recommended but are not provided by the [facility] will be made to provide these services after discharge if needed.  Arrangements include referral to agencies that provide these services.  Your insurance has been verified to be:  [facility] Medicare  Your primary doctor is:  Deland Pretty  Pertinent information will be shared with your doctor and your insurance company.  Inpatient Rehabilitation Care Coordinator:  Cathleen Corti [phone] or ([phone]  Information discussed with and copy given to patient by: Rana Snare, 03/23/2021, 10:08 AM

## 2021-03-23 NOTE — Progress Notes (Signed)
Occupational Therapy Session Note  Patient Details  Name: Angela Morales MRN: [id number] Date of Birth: 11-01-1945  Today's Date: 03/23/2021 OT Individual Time: 7591-6384 OT Individual Time Calculation (min): 70 min    Short Term Goals: Week 1:  OT Short Term Goal 1 (Week 1): Pt will complete BSC or toilet trasnfer with 1 assist OT Short Term Goal 2 (Week 1): Pt will complete LB dressing with no more than Max A using adaptive techniques OT Short Term Goal 3 (Week 1): Pt will direct care for 1 OOB trasnfer given min cues in prep for directing care to caregivers during ADL routine at home  Skilled Therapeutic Interventions/Progress Updates:    Pt resting in TIS w/c upon arrival. Pt requested to return to bed at end of session. OT intervention with focus on discharge planning, BUE strengthening, SB tranfsers, bed mobility, and activity tolerance to increase independence with BADLs. BUE strengthening with 2# bar for straight arm raises and tapping beach ball (4x10). Pt has walk-in shower but large enough to accommodate a shower seat. SB transfer w/c>bed with mod A. Sit>supine with mod A for lift BLE onto bed. Pt assisted with repositioning. Pt remained in bed with all needs within reach and bed alarm activated.   Therapy Documentation Precautions:  Precautions Precautions: Fall Required Braces or Orthoses: Other Brace Other Brace: bilateral ankle air splints, PRAFOs Restrictions Weight Bearing Restrictions: No   Pain:  Pt reports Rt hip discomfort with lateral leans to Rt; repositioned   Therapy/Group: Individual Therapy  Leroy Libman 03/23/2021, 12:03 PM

## 2021-03-23 NOTE — Progress Notes (Signed)
Occupational Therapy Session Note  Patient Details  Name: Angela Morales MRN: [id number] Date of Birth: [birth year]  Today's Date: 03/23/2021 OT Individual Time: 2902-1115 OT Individual Time Calculation (min): 57 min    Short Term Goals: Week 1:  OT Short Term Goal 1 (Week 1): Pt will complete BSC or toilet trasnfer with 1 assist OT Short Term Goal 2 (Week 1): Pt will complete LB dressing with no more than Max A using adaptive techniques OT Short Term Goal 3 (Week 1): Pt will direct care for 1 OOB trasnfer given min cues in prep for directing care to caregivers during ADL routine at home  Skilled Therapeutic Interventions/Progress Updates:    OT intervention with focus on bed mobility, lateral scoots EOB, squats from EOB, and activity tolerance. Min A for supine>sit EOB. Pt able to push up from sidelying without assistance. Pt performed squats from EOB, raising buttocks approx 4" off bed with mod A. Lateral scoots R and L with min A. Pt performed sit>stand X 1 with mod A. Pt initiates task with min A but mod A to complete to standing. Mod A for sit>supine with assistance for managing BLE. Pt remained in bed with all needs within reach and bed alarm activated.   Therapy Documentation Precautions:  Precautions Precautions: Fall Required Braces or Orthoses: Other Brace Other Brace: bilateral ankle air splints, PRAFOs Restrictions Weight Bearing Restrictions: No Pain:  Pt c/o 4/10 Rt hip pain with activity; repositioned   Therapy/Group: Individual Therapy  Leroy Libman 03/23/2021, 2:54 PM

## 2021-03-24 MED ORDER — TAMSULOSIN HCL 0.4 MG PO CAPS
0.4000 mg | ORAL_CAPSULE | Freq: Every day | ORAL | Status: DC
  Administered 2021-03-24 – 2021-03-29 (×6): 0.4 mg via ORAL
  Filled 2021-03-24 (×6): qty 1

## 2021-03-24 NOTE — Progress Notes (Signed)
Occupational Therapy Session Note  Patient Details  Name: Angela Morales MRN: [id number] Date of Birth: Jul 12, 1945  Today's Date: 03/24/2021 OT Individual Time: 0700-0825 OT Individual Time Calculation (min): 85 min    Short Term Goals: Week 1:  OT Short Term Goal 1 (Week 1): Pt will complete BSC or toilet trasnfer with 1 assist OT Short Term Goal 2 (Week 1): Pt will complete LB dressing with no more than Max A using adaptive techniques OT Short Term Goal 3 (Week 1): Pt will direct care for 1 OOB trasnfer given min cues in prep for directing care to caregivers during ADL routine at home  Skilled Therapeutic Interventions/Progress Updates:    Pt resting in bed upon arrival. OT intervention with focus on bed mobility, LB bathing/dressing at bed level, sitting balance, SB tranfsers, UB bathing/dressing w/c level at sink, discharge planning, and activity tolerance to increase independence with BADLs. LB bathing/dressing at bed level. Pt requires assistance bathing lower legs/feet and buttocks. Min A for rolling R/L in bed using bed rails. Tot A for LB dressing and footwear. Supine>sit EOB with HOB slightly elevated with min A to move BLE to EOB. Pt able to push up from sidelying without assistance. SB tranfser to w/c with min A. Pt completed UB bathing/dressing seated in w/c at sink. Discussed bathroom layout. Requested pt's husband take picture of bathroom and shower. Demonstrated options for seating for shower. Pt returned to room and remained seated in w/c with all needs within reach.   Therapy Documentation Precautions:  Precautions Precautions: Fall Required Braces or Orthoses: Other Brace Other Brace: bilateral ankle air splints, PRAFOs Restrictions Weight Bearing Restrictions: No Pain:  Pt denies pain this morning   Therapy/Group: Individual Therapy  Leroy Libman 03/24/2021, 8:26 AM

## 2021-03-24 NOTE — Progress Notes (Signed)
Orthopedic Tech Progress Note Patient Details:  Angela Morales 1946/04/16 [id number]  Called in order to [facility] for an AFO CONSULT   Patient ID: Angela Morales, female   DOB: 1945-08-13, 75 y.o.   MRN: [id number]  [name] 03/24/2021, 1:42 PM

## 2021-03-24 NOTE — Progress Notes (Signed)
PROGRESS NOTE   Subjective/Complaints:  Pt reports was hard to eat, mouth was so dry on Urecholine- which was stopped- slightly better.  Still requiring in/out caths x2 in last 24 hours. Asking to restart Flomax.  Doesn't think really used before transferred to acute hospital for afib?   ROS:  Pt denies SOB, abd pain, CP, N/V/C/D, and vision changes   Objective:   No results found. Recent Labs    03/23/21 0756  WBC 6.8  HGB 12.7  HCT 39.7  PLT 235   Recent Labs    03/23/21 0756  NA 133*  K 3.7  CL 98  CO2 28  GLUCOSE 109*  BUN 7*  CREATININE 0.52  CALCIUM 8.9    Intake/Output Summary (Last 24 hours) at 03/24/2021 0854 Last data filed at 03/24/2021 0700 Gross per 24 hour  Intake 480 ml  Output 1325 ml  Net -845 ml        Physical Exam: Vital Signs Blood pressure 105/62, pulse 75, temperature 97.8 F (36.6 C), temperature source Oral, resp. rate 18, height 5' 4"  (1.626 m), last menstrual period 04/20/2005, SpO2 96 %.    General: awake, alert, appropriate, sitting up slightly in bed; Tom OTA in room; getting a bath; NAD HENT: conjugate gaze; oropharynx moist CV: regular rate- rate in 80s; no JVD Pulmonary: CTA B/L; no W/R/R- good air movement GI: soft, NT, ND, (+)BS Psychiatric: appropriate Neurological: Ox3  Neuro:  Alert and oriented x 3. Normal insight and awareness. Intact Memory. Normal language and speech. Cranial nerve exam unremarkable. UE 4+/5. LE: 4/5 prox to 3+ to 4-/5 distally. Stocking glove sensory loss below knees bilaterally. DTR's absent. Neuro stable. Musculoskeletal: Full ROM, No pain with AROM or PROM in the neck, trunk, or extremities. Posture appropriate     Assessment/Plan: 1. Functional deficits which require 3+ hours per day of interdisciplinary therapy in a comprehensive inpatient rehab setting. Physiatrist is providing close team supervision and 24 hour management of  active medical problems listed below. Physiatrist and rehab team continue to assess barriers to discharge/monitor patient progress toward functional and medical goals  Care Tool:  Bathing    Body parts bathed by patient: Right arm, Left arm, Chest, Abdomen, Front perineal area, Right upper leg, Left upper leg, Face   Body parts bathed by helper: Buttocks, Right lower leg, Left lower leg     Bathing assist Assist Level: Moderate Assistance - Patient 50 - 74%     Upper Body Dressing/Undressing Upper body dressing   What is the patient wearing?: Pull over shirt    Upper body assist Assist Level: Set up assist    Lower Body Dressing/Undressing Lower body dressing      What is the patient wearing?: Incontinence brief, Pants     Lower body assist Assist for lower body dressing: Total Assistance - Patient < 25%     Toileting Toileting    Toileting assist Assist for toileting: Total Assistance - Patient < 25%     Transfers Chair/bed transfer  Transfers assist     Chair/bed transfer assist level: Moderate Assistance - Patient 50 - 74% Chair/bed transfer assistive device: Sliding board   Locomotion  Ambulation   Ambulation assist   Ambulation activity did not occur: Safety/medical concerns          Walk 10 feet activity   Assist  Walk 10 feet activity did not occur: Safety/medical concerns        Walk 50 feet activity   Assist Walk 50 feet with 2 turns activity did not occur: Safety/medical concerns         Walk 150 feet activity   Assist Walk 150 feet activity did not occur: Safety/medical concerns         Walk 10 feet on uneven surface  activity   Assist Walk 10 feet on uneven surfaces activity did not occur: Safety/medical concerns         Wheelchair     Assist Is the patient using a wheelchair?: Yes Type of Wheelchair: Manual Wheelchair activity did not occur: Safety/medical concerns         Wheelchair 50 feet with 2  turns activity    Assist    Wheelchair 50 feet with 2 turns activity did not occur: Safety/medical concerns       Wheelchair 150 feet activity     Assist  Wheelchair 150 feet activity did not occur: Safety/medical concerns       Blood pressure 105/62, pulse 75, temperature 97.8 F (36.6 C), temperature source Oral, resp. rate 18, height 5' 4"  (1.626 m), last menstrual period 04/20/2005, SpO2 96 %.  Medical Problem List and Plan: 1.  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             -patient may shower             -ELOS/Goals: 17-20 days/min a             -con't PT and OT/CIR- team conference today to determine d/c date.  2.  Antithrombotics: -DVT/anticoagulation:  Pharmaceutical: Other (comment) --Eliquis             -antiplatelet therapy: N/A 3. Pain Management: Oxycodone  prn. Using every other day at this point             -reduce gabapentin to 382m qhs d/t dry mouth 4. Mood: LCSW to follow for evaluation and support.              -antipsychotic agents: N/A 5. Neuropsych: This patient is capable of making decisions on her own behalf. 6. Skin/Wound Care: Routine pressure relief measures.  7. Fluids/Electrolytes/Nutrition: Monitor I/Os.  Does not like nutritional supplements.              -9/26 ate a little better this weekend, 9/24 labs ok  -appreciate RD recs! Continue recommended supps 8. A flutter s/p DCCV: No in NSR on amiodarone and Eliquis             --Monitor HR/BP on TID basis  9/27- HR is doing well currently- con't regimen             Monitor with increased activity 9. Hypokalemia: Continue K dur for supplement             CMP reviewed 9/24  9/27- K+ 3.7- will recheck 2x/week  10. Neurogenic bladder: Monitor voiding with PVRs/bladder scans,             -9/26 still with urine retention, sporadic 50-1100cc -increase Urecholine to 266mbid    -reduce gabapentin  9/27- will restart Flomax 0.4  mg qsupper and monitor- didn't really  get muchbefore went into Afib with RVR 11. 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  12. Hyponatremia: Continues to vary from 131-134.               9/24: 133       LOS: 4 days A FACE TO FACE EVALUATION WAS PERFORMED  Ashdon Gillson 03/24/2021, 8:54 AM

## 2021-03-24 NOTE — Plan of Care (Signed)
  Problem: RH Bed to Chair Transfers Goal: LTG Patient will perform bed/chair transfers w/assist (PT) Description: LTG: Patient will perform bed to chair transfers with assistance (PT). Flowsheets (Taken 03/24/2021 1214) LTG: Pt will perform Bed to Chair Transfers with assistance level: (upgrade due to progress) Supervision/Verbal cueing Note: Upgrade due to progress   Problem: RH Wheelchair Mobility Goal: LTG Patient will propel w/c in controlled environment (PT) Description: LTG: Patient will propel wheelchair in controlled environment, # of feet with assist (PT) Flowsheets (Taken 03/24/2021 1215) LTG: Pt will propel w/c in controlled environ  assist needed:: (upgrade due to progress) Independent with assistive device LTG: Propel w/c distance in controlled environment: 150 Note: Upgrade due to progress   Problem: RH Wheelchair Mobility Goal: LTG Patient will propel w/c in home environment (PT) Description: LTG: Patient will propel wheelchair in home environment, # of feet with assistance (PT). Flowsheets (Taken 03/24/2021 1214) LTG: Pt will propel w/c in home environ  assist needed:: (add goal due to progress) Independent with assistive device LTG: Propel w/c distance in home environment: 75 ft Note: Add goal due to progress

## 2021-03-24 NOTE — Progress Notes (Signed)
Patient ID: Angela Morales, female   DOB: [birth year], 75 y.o.   MRN: [id number]  SW met with pt in room to provide updates from team conference, and d/c date 10/12. She is aware that SW will follow-up with her husband.   SW spoke with pt husband [name] [phone]) to provide updates from team conference, and d/c date 10/12. SW discussed pt will require some physical assistance SW explained there will be more follow-ups to discuss patient care needs as closer towards discharge. Reports he will be primary caregiver, however, plans to hire private aide to assist as well since he is working.   Loralee Pacas, MSW, [place] Office: [phone] Cell: [phone] Fax: [phone]

## 2021-03-24 NOTE — Patient Care Conference (Signed)
Inpatient RehabilitationTeam Conference and Plan of Care Update Date: 03/24/2021   Time: 11:00 AM    Patient Name: Angela Morales      Medical Record Number: [id number]  Date of Birth: [birth year] Sex: Female         Room/Bed: 4M11C/4M11C-01 Payor Info: Payor: [employer] MEDICARE / Plan: [facility] MEDICARE / Product Type: *No Product type* /    Admit Date/Time:  03/20/2021  3:55 PM  Primary Diagnosis:  GBS (Guillain-Barre syndrome) [facility])  Hospital Problems: Principal Problem:   GBS (Guillain-Barre syndrome) [facility])    Expected Discharge Date: Expected Discharge Date: 04/08/21  Team Members Present: Physician leading conference: Dr. Courtney Heys Social Worker Present: Loralee Pacas, [place] Nurse Present: Dorthula Nettles, RN PT Present: Excell Seltzer, PT OT Present: Roanna Epley, [place], OT PPS Coordinator present : Gunnar Fusi, SLP     Current Status/Progress Goal Weekly Team Focus  Bowel/Bladder   In & out cath for no void. incontinent of bowel. LBM 9/24  Minimize incontinent episodes, Regain voiding ability  Toilet Q 2hr and PRN   Swallow/Nutrition/ Hydration             ADL's   bed mobility-mod A; bathing-max A: LB dressing-tot A: SB tranfsers-mod A  min A overall  BADL retraining, functional transfers, core strengthening; activity tolerance   Mobility   mod A bed mobility, mod A SB transfer, max A to stand in stedy, ankle instability  min A overall  LE NMR, standing as able, transfers   Communication             Safety/Cognition/ Behavioral Observations            Pain   Pain in lower back. PRN available.  <3  Assess pain Qshift and PRN   Skin   Incisions closed  Maintain skin integrity  Assess qshift and PRN     Discharge Planning:  Pt to d/c to home with husband who works remotely. Pt needs to be atleast intermittent supervision at time of discharge. SW will confirm there are no barriers to discharge.   Team Discussion: Reduced Gabapentin,  increased Urecholine, started Flomax. No longer in A-fib. Incontinent B/B, LBM 03/21/21. 7/10 pain reported. Has Tylenol and Oxy IR available. Skin is CDI. Nursing educating on B/B management, medications. Barrier is incontinence. Going home with husband. Doing better than before transfer to acute. Goal to do squat pivot with scoot transfer. Working on problem solving for home bathroom. Mod assist for bed mobility, min/mod assist with slide board. Min assist with steady. Would like AFO consult. Patient on target to meet rehab goals: yes  *See Care Plan and progress notes for long and short-term goals.   Revisions to Treatment Plan:  MD reduced Gabapentin, increased Urecholine, started Flomax. Air cast not providing enough stability for ankles, put in for AFO consult.  Teaching Needs: Family education, medication management, pain management, B/B management, transfer training, slide board training, gait training, W/C training, balance training, endurance training, safety awareness.  Current Barriers to Discharge: Decreased caregiver support, Medical stability, Home enviroment access/layout, Incontinence, Neurogenic bowel and bladder, Lack of/limited family support, and Medication compliance  Possible Resolutions to Barriers: Continue current medications for pain management, educate spouse on I&O caths, bowel management, provide education on AFO's, provide emotional support.     Medical Summary Current Status: NSR- cardioverted in cardiac/acute hospital; Has AIDP/GBS- still incontinent- LBM 9/24- rates pain 7/10- urinary retention- needing caths; B/B- working on;  Barriers to Discharge: Decreased family/caregiver  support;Home enviroment access/layout;Incontinence;Neurogenic Bowel & Bladder;Medical stability  Barriers to Discharge Comments: home with husband- is plan- need to talk with him; Possible Resolutions to [employer] Focus: goals/focus- Air casts not helpful for ankle instability-  urinary retention-  restart FLomax 0.4 mg qsupper-likely will need higher dose;  needs AFO consults- will place them today.   d/c- 04/08/21   Continued Need for Acute Rehabilitation Level of Care: The patient requires daily medical management by a physician with specialized training in physical medicine and rehabilitation for the following reasons: Direction of a multidisciplinary physical rehabilitation program to maximize functional independence : Yes Medical management of patient stability for increased activity during participation in an intensive rehabilitation regime.: Yes Analysis of laboratory values and/or radiology reports with any subsequent need for medication adjustment and/or medical intervention. : Yes   I attest that I was present, lead the team conference, and concur with the assessment and plan of the team.   Cristi Loron 03/24/2021, 3:12 PM

## 2021-03-24 NOTE — Progress Notes (Signed)
Physical Therapy Session Note  Patient Details  Name: Angela Morales MRN: [id number] Date of Birth: Nov 12, 1945  Today's Date: 03/24/2021 PT Individual Time: 1135-1200; 1300-1400 PT Individual Time Calculation (min): 25 min and 60 min  Short Term Goals: Week 1:  PT Short Term Goal 1 (Week 1): Pt will perform sit to stand with mod A of 1 PT Short Term Goal 2 (Week 1): Pt will perform SB transfer with min A PT Short Term Goal 3 (Week 1): Pt will initiate pre-gait activity with LRAD  Skilled Therapeutic Interventions/Progress Updates:    Session 1: Pt received seated in bed, agreeable to PT session. No complaints of pain. Semi-reclined in bed to sitting EOB with mod A needed for BLE management. Slide board transfer bed to w/c with min A. Seated BLE gastroc stretch 3 x 60 sec each with use of gait belt and assist from therapist. Seated BLE strengthening therex: LAQ, glute sets x 10 reps each. Pt left seated in TIS chair in room setup for lunch, needs in reach.  Session 2: Pt received seated in w/c in room, agreeable to PT session. No complaints of pain at rest, has onset of R hip pain with mobility 6/10 with therapy. Nursing able to provide pain medication at end of session. Brooke Pace, [facility] present during session to assess ankle instability and current use of ankle aircasts. Pt is able to perform sit to stand to stedy x 2 reps with mod A. In standing pt exhibits increase in L ankle inversion as compared to R ankle. Orthotist recommending continued use of aircasts, tennis shoes, and wide stance to prevent ankle injury and further AFO assessment to take place when pt becomes ambulatory. Slide board transfer w/c to bed with min A. Slide board transfer bed to/from [facility] with use of slide board and mod A x 1 and assist from a 2nd person to stabilize slide board from moving. Updated NT on pt's current transfer method (use of stedy with tennis shoes and air casts OR use of slide board to/from [facility]). Pt also  understanding of current transfer level. Pt is total A to doff pants while seated on BSC and to don pants once seated EOB. Sit to supine mod A for BLE management. Remainder of session focus on BLE strengthening and NMR from supine and hook lying positions. Supine bridges x 10 reps with assist needed to stabilize ankles, SKFO x 10 reps with assist needed to stabilize ankles, hip flexion x 10 reps with AAROM needed to perform, hip abd x 10 reps with AAROM needed. Assisted pt with doffing air casts and tennis shoes and donning PRAFOs at end of session. Pt left semi-reclined in bed with needs in reach, bed alarm in place.   Therapy Documentation Precautions:  Precautions Precautions: Fall Required Braces or Orthoses: Other Brace Other Brace: bilateral ankle air splints, PRAFOs Restrictions Weight Bearing Restrictions: No  Therapy/Group: Individual Therapy   Excell Seltzer, PT, DPT, CSRS  03/24/2021, 12:11 PM

## 2021-03-24 NOTE — Plan of Care (Signed)
  Problem: RH Bed to Chair Transfers Goal: LTG Patient will perform bed/chair transfers w/assist (PT) Description: LTG: Patient will perform bed to chair transfers with assistance (PT). Flowsheets (Taken 03/24/2021 1214) LTG: Pt will perform Bed to Chair Transfers with assistance level: (upgrade due to progress) Supervision/Verbal cueing Note: Upgrade due to progress   Problem: RH Wheelchair Mobility Goal: LTG Patient will propel w/c in home environment (PT) Description: LTG: Patient will propel wheelchair in home environment, # of feet with assistance (PT). Flowsheets (Taken 03/24/2021 1214) LTG: Pt will propel w/c in home environ  assist needed:: (add goal due to progress) Independent with assistive device LTG: Propel w/c distance in home environment: 75 ft Note: Add goal due to progress

## 2021-03-25 NOTE — Progress Notes (Signed)
PROGRESS NOTE   Subjective/Complaints:  Pt asking about Why we are using Flomax- went over this.   Sleeping OK.  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    ROS:   Pt denies SOB, abd pain, CP, N/V/C/D, and vision changes    Objective:   No results found. Recent Labs    03/23/21 0756  WBC 6.8  HGB 12.7  HCT 39.7  PLT 235   Recent Labs    03/23/21 0756  NA 133*  K 3.7  CL 98  CO2 28  GLUCOSE 109*  BUN 7*  CREATININE 0.52  CALCIUM 8.9    Intake/Output Summary (Last 24 hours) at 03/25/2021 0755 Last data filed at 03/24/2021 2100 Gross per 24 hour  Intake 480 ml  Output 647 ml  Net -167 ml        Physical Exam: Vital Signs Blood pressure 116/63, pulse 80, temperature 97.7 F (36.5 C), temperature source Oral, resp. rate 18, height 5' 4"  (1.626 m), last menstrual period 04/20/2005, SpO2 99 %.     General: awake, alert, appropriate,  supine in bed; OTA in room; NAD HENT: conjugate gaze; oropharynx moist CV: regular rate; no JVD Pulmonary: CTA B/L; no W/R/R- good air movement GI: soft, NT, ND, (+)BS Psychiatric: appropriate Neurological: Ox3; alert Neuro:  Alert and oriented x 3. Normal insight and awareness. Intact Memory. Normal language and speech. Cranial nerve exam unremarkable. UE 4+/5. LE: 4/5 prox to 3+ to 4-/5 distally. Stocking glove sensory loss below knees bilaterally. DTR's absent. Neuro stable. Musculoskeletal: Full ROM, No pain with AROM or PROM in the neck, trunk, or extremities. Posture appropriate     Assessment/Plan: 1. Functional deficits which require 3+ hours per day of interdisciplinary therapy in a comprehensive inpatient rehab setting. Physiatrist is providing close team supervision and 24 hour management of active medical problems listed below. Physiatrist and rehab team continue to assess barriers to discharge/monitor  patient progress toward functional and medical goals  Care Tool:  Bathing    Body parts bathed by patient: Right arm, Left arm, Chest, Abdomen, Front perineal area, Right upper leg, Left upper leg, Face   Body parts bathed by helper: Buttocks, Right lower leg, Left lower leg     Bathing assist Assist Level: Moderate Assistance - Patient 50 - 74%     Upper Body Dressing/Undressing Upper body dressing   What is the patient wearing?: Pull over shirt    Upper body assist Assist Level: Set up assist    Lower Body Dressing/Undressing Lower body dressing      What is the patient wearing?: Incontinence brief, Pants     Lower body assist Assist for lower body dressing: Total Assistance - Patient < 25%     Toileting Toileting    Toileting assist Assist for toileting: Total Assistance - Patient < 25%     Transfers Chair/bed transfer  Transfers assist     Chair/bed transfer assist level: Moderate Assistance - Patient 50 - 74% Chair/bed transfer assistive device: Sliding board   Locomotion Ambulation   Ambulation assist   Ambulation activity did not occur: Safety/medical concerns  Walk 10 feet activity   Assist  Walk 10 feet activity did not occur: Safety/medical concerns        Walk 50 feet activity   Assist Walk 50 feet with 2 turns activity did not occur: Safety/medical concerns         Walk 150 feet activity   Assist Walk 150 feet activity did not occur: Safety/medical concerns         Walk 10 feet on uneven surface  activity   Assist Walk 10 feet on uneven surfaces activity did not occur: Safety/medical concerns         Wheelchair     Assist Is the patient using a wheelchair?: Yes Type of Wheelchair: Manual Wheelchair activity did not occur: Safety/medical concerns         Wheelchair 50 feet with 2 turns activity    Assist    Wheelchair 50 feet with 2 turns activity did not occur: Safety/medical  concerns       Wheelchair 150 feet activity     Assist  Wheelchair 150 feet activity did not occur: Safety/medical concerns       Blood pressure 116/63, pulse 80, temperature 97.7 F (36.5 C), temperature source Oral, resp. rate 18, height 5' 4"  (1.626 m), last menstrual period 04/20/2005, SpO2 99 %.  Medical Problem List and Plan: 1.  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             -patient may shower             -ELOS/Goals: 17-20 days/min a             -con't PT and OT- d/c 10/12 2.  Antithrombotics: -DVT/anticoagulation:  Pharmaceutical: Other (comment) --Eliquis             -antiplatelet therapy: N/A 3. Pain Management: Oxycodone  prn. Using every other day at this point             -reduce gabapentin to 348m qhs d/t dry mouth  9/28- pain controlled when takes oxy prn; con't regimen 4. Mood: LCSW to follow for evaluation and support.              -antipsychotic agents: N/A 5. Neuropsych: This patient is capable of making decisions on her own behalf. 6. Skin/Wound Care: Routine pressure relief measures.  7. Fluids/Electrolytes/Nutrition: Monitor I/Os.  Does not like nutritional supplements.              -9/26 ate a little better this weekend, 9/24 labs ok  -appreciate RD recs! Continue recommended supps 8. A flutter s/p DCCV: No in NSR on amiodarone and Eliquis             --Monitor HR/BP on TID basis  9/27- HR is doing well currently- con't regimen             Monitor with increased activity 9. Hypokalemia: Continue K dur for supplement             CMP reviewed 9/24  9/27- K+ 3.7- will recheck 2x/week  10. Neurogenic bladder: Monitor voiding with PVRs/bladder scans,             -9/26 still with urine retention, sporadic 50-1100cc -increase Urecholine to 279mbid    -reduce gabapentin  9/27- will restart Flomax 0.4 mg qsupper and monitor- didn't really get muchbefore went into Afib with RVR 11.  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 9/28- LBM yesterday- con't regimen  12. Hyponatremia: Continues to vary from 131-134.               9/24: 133       LOS: 5 days A FACE TO FACE EVALUATION WAS PERFORMED  Casimir Barcellos 03/25/2021, 7:55 AM

## 2021-03-25 NOTE — Progress Notes (Signed)
Occupational Therapy Session Note  Patient Details  Name: Angela Morales MRN: [id number] Date of Birth: 05-02-1946  Today's Date: 03/25/2021 OT Individual Time: 0700-0808 OT Individual Time Calculation (min): 68 min    Short Term Goals: Week 1:  OT Short Term Goal 1 (Week 1): Pt will complete BSC or toilet trasnfer with 1 assist OT Short Term Goal 2 (Week 1): Pt will complete LB dressing with no more than Max A using adaptive techniques OT Short Term Goal 3 (Week 1): Pt will direct care for 1 OOB trasnfer given min cues in prep for directing care to caregivers during ADL routine at home  Skilled Therapeutic Interventions/Progress Updates:    OT intervention with focus on bed mobility, sitting balance, SB tranfser to [facility], sit<>stand and standing balance in [place], LB bahting/dressing at bed level, and UB bathing/dressing w/c level at sink. Supine>sit EOB with supervision with HOB elevated (pt did not have shoes on.)Pt requested to sit DABSC in attempt to void. SB transfer with mod A. LB dressing sit<>stand from [facility]. Pt required assistance threading BLE into pants without use of reacher. Sit<>stand in [place] with mod A. Standing balance in [place] with min A and pt using BUE for support. Pt completed UB bathing/dressing with supervision/setup. Pt with questions about hospital bed at discharge. Explained that decision would be made closer to discharge. Pt remained in w/c with all needs within reach.   Therapy Documentation Precautions:  Precautions Precautions: Fall Required Braces or Orthoses: Other Brace Other Brace: bilateral ankle air splints, PRAFOs Restrictions Weight Bearing Restrictions: No  Pain:  Pt denies pain this morning  Therapy/Group: Individual Therapy  Leroy Libman 03/25/2021, 8:17 AM

## 2021-03-25 NOTE — Progress Notes (Signed)
Physical Therapy Session Note  Patient Details  Name: Angela Morales MRN: [id number] Date of Birth: Nov 30, 1945  Today's Date: 03/25/2021 PT Individual Time: 0900-1000 PT Individual Time Calculation (min): 60 min   Short Term Goals: Week 1:  PT Short Term Goal 1 (Week 1): Pt will perform sit to stand with mod A of 1 PT Short Term Goal 2 (Week 1): Pt will perform SB transfer with min A PT Short Term Goal 3 (Week 1): Pt will initiate pre-gait activity with LRAD  Skilled Therapeutic Interventions/Progress Updates: Pt presented in TIS agreeable to therapy. Pt states some soreness in R ankle but did not rate nor request intervention during session. Pt agreeable to try standing frame for standing tolerance. Pt transported to ortho gym and set up in standing frame. Pt tolerated ~42mn and ~3 min in standing with seated rest between bouts. During first trial pt participated in TKE and glue sets to fatigue. Pt noted to require some assist with TKE and mild hyperextension noted. During second bout pt participated in reaching tasks placing cones crossing midline as well as reaching up with single UE support attempting to progress to no UE support. Pt primarily with forward flexed posture however was able to correct intermittently without cues to improved erect posture. After standing frame pt performed Slide board transfer to high/low mat and participated in dynamic sitting activities. Pt performed ball taps with 2lb dowel 2 x 20, ball catches with lateral reaching bilaterally 2 x 20, and horseshoe toss to cones placed in front of and laterally towards pt. Pt then performed Slide board transfer to TIS with PTA setting up SB and pt performing transfer 90% CGA with minA at end due to fatigue. Pt transported back to room and performed Slide board transfer back to bed with minA. PTA doffed shoes and air casts total A and performed sit to supine with modA for BLE placement. Pt was unable to perform bridge to  reposition this PTA used draw sheet to reposition pt. Pt left in bed at end of session with bed alarm on, call bell within reach and needs met.      Therapy Documentation Precautions:  Precautions Precautions: Fall Required Braces or Orthoses: Other Brace Other Brace: bilateral ankle air splints, PRAFOs Restrictions Weight Bearing Restrictions: No General:   Vital Signs:   Pain:   Mobility:   Locomotion :    Trunk/Postural Assessment :    Balance:   Exercises:   Other Treatments:      Therapy/Group: Individual Therapy  Gevin Perea 03/25/2021, 4:13 PM

## 2021-03-25 NOTE — Progress Notes (Signed)
Physical Therapy Session Note  Patient Details  Name: Angela Morales MRN: [id number] Date of Birth: Dec 16, 1945  Today's Date: 03/25/2021 PT Individual Time: 1300-1400 PT Individual Time Calculation (min): 60 min   Short Term Goals: Week 1:  PT Short Term Goal 1 (Week 1): Pt will perform sit to stand with mod A of 1 PT Short Term Goal 2 (Week 1): Pt will perform SB transfer with min A PT Short Term Goal 3 (Week 1): Pt will initiate pre-gait activity with LRAD  Skilled Therapeutic Interventions/Progress Updates:    Pt received seated in bed, agreeable to PT session. No complaints of pain this date. Supine to sit with min A needed for RLE management. Obtained 16x16 manual w/c to initiate w/c mobility. Slide board transfer with min A throughout session. Manual w/c propulsion x 100 ft with use of BUE at Supervision level. Reviewed management of w/c parts including removing arm rests and leg rests. Pt doesn't exhibit enough strength in fingers to unlock armrest, pt able to remove leg rest with Supervision cueing. Manual w/c propulsion x 30 ft backwards with use of BLE for strengthening, attempt to have pt propel forwards but unable to due to LE weakness. Pt has onset of nausea/vomiting with emesis at end of session. Nursing notified of pt's symptoms. Pt returned to bed at end of session, sit to supine with mod A needed for BLE management. Pt left semi-reclined in w/c with needs in reach, bed alarm in place.  Therapy Documentation Precautions:  Precautions Precautions: Fall Required Braces or Orthoses: Other Brace Other Brace: bilateral ankle air splints, PRAFOs Restrictions Weight Bearing Restrictions: No   Therapy/Group: Individual Therapy   Excell Seltzer, PT, DPT, CSRS  03/25/2021, 3:32 PM

## 2021-03-26 NOTE — Progress Notes (Signed)
PROGRESS NOTE   Subjective/Complaints:  Pt reports Had nausea/vomiting x1 last night- took Compazine- doing better/resolved- LBM this AM.  Wants ground pass to visit grandkids and dog- will order.   L foot is rotating some in [facility]- internal rotation.   ROS:   Pt denies SOB, abd pain, CP, N/V/C/D, and vision changes    Objective:   No results found. No results for input(s): WBC, HGB, HCT, PLT in the last 72 hours.  No results for input(s): NA, K, CL, CO2, GLUCOSE, BUN, CREATININE, CALCIUM in the last 72 hours.   Intake/Output Summary (Last 24 hours) at 03/26/2021 0905 Last data filed at 03/26/2021 0802 Gross per 24 hour  Intake 358 ml  Output 475 ml  Net -117 ml        Physical Exam: Vital Signs Blood pressure 116/73, pulse 82, temperature 98.4 F (36.9 C), temperature source Oral, resp. rate 18, height 5' 4"  (1.626 m), last menstrual period 04/20/2005, SpO2 98 %.      General: awake, alert, appropriate, supine in bed; OTA in room; NAD HENT: conjugate gaze; oropharynx moist CV: regular rate; no JVD Pulmonary: CTA B/L; no W/R/R- good air movement GI: soft, NT, ND, (+)BS Psychiatric: appropriate Neurological: Ox3  Neuro:  Alert and oriented x 3. Normal insight and awareness. Intact Memory. Normal language and speech. Cranial nerve exam unremarkable. UE 4+/5. LE: 4/5 prox to 3+ to 4-/5 distally. Stocking glove sensory loss below knees bilaterally. DTR's absent. Neuro stable. Musculoskeletal: Full ROM, No pain with AROM or PROM in the neck, trunk, or extremities. Posture appropriate   L foot- has rotation/internal of L foot at rest- peroneus?  Assessment/Plan: 1. Functional deficits which require 3+ hours per day of interdisciplinary therapy in a comprehensive inpatient rehab setting. Physiatrist is providing close team supervision and 24 hour management of active medical problems listed below. Physiatrist  and rehab team continue to assess barriers to discharge/monitor patient progress toward functional and medical goals  Care Tool:  Bathing    Body parts bathed by patient: Right arm, Left arm, Chest, Abdomen, Front perineal area, Right upper leg, Left upper leg, Face   Body parts bathed by helper: Buttocks, Right lower leg, Left lower leg     Bathing assist Assist Level: Moderate Assistance - Patient 50 - 74%     Upper Body Dressing/Undressing Upper body dressing   What is the patient wearing?: Pull over shirt    Upper body assist Assist Level: Set up assist    Lower Body Dressing/Undressing Lower body dressing      What is the patient wearing?: Incontinence brief, Pants     Lower body assist Assist for lower body dressing: Total Assistance - Patient < 25%     Toileting Toileting    Toileting assist Assist for toileting: Total Assistance - Patient < 25%     Transfers Chair/bed transfer  Transfers assist     Chair/bed transfer assist level: Minimal Assistance - Patient > 75% Chair/bed transfer assistive device: Sliding board   Locomotion Ambulation   Ambulation assist   Ambulation activity did not occur: Safety/medical concerns          Walk 10 feet  activity   Assist  Walk 10 feet activity did not occur: Safety/medical concerns        Walk 50 feet activity   Assist Walk 50 feet with 2 turns activity did not occur: Safety/medical concerns         Walk 150 feet activity   Assist Walk 150 feet activity did not occur: Safety/medical concerns         Walk 10 feet on uneven surface  activity   Assist Walk 10 feet on uneven surfaces activity did not occur: Safety/medical concerns         Wheelchair     Assist Is the patient using a wheelchair?: Yes Type of Wheelchair: Manual Wheelchair activity did not occur: Safety/medical concerns  Wheelchair assist level: Supervision/Verbal cueing Max wheelchair distance: 100'     Wheelchair 50 feet with 2 turns activity    Assist    Wheelchair 50 feet with 2 turns activity did not occur: Safety/medical concerns   Assist Level: Supervision/Verbal cueing   Wheelchair 150 feet activity     Assist  Wheelchair 150 feet activity did not occur: Safety/medical concerns       Blood pressure 116/73, pulse 82, temperature 98.4 F (36.9 C), temperature source Oral, resp. rate 18, height 5' 4"  (1.626 m), last menstrual period 04/20/2005, SpO2 98 %.  Medical Problem List and Plan: 1.  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             -patient may shower             -ELOS/Goals: 17-20 days/min a             -d/c 10/12  9/29- con't PT and OT- CIR- will make sure PRAFO placed firmly 2.  Antithrombotics: -DVT/anticoagulation:  Pharmaceutical: Other (comment) --Eliquis             -antiplatelet therapy: N/A 3. Pain Management: Oxycodone  prn. Using every other day at this point             -reduce gabapentin to 358m qhs d/t dry mouth  9/28- pain controlled when takes oxy prn; con't regimen 4. Mood: LCSW to follow for evaluation and support.              -antipsychotic agents: N/A 5. Neuropsych: This patient is capable of making decisions on her own behalf. 6. Skin/Wound Care: Routine pressure relief measures.  7. Fluids/Electrolytes/Nutrition: Monitor I/Os.  Does not like nutritional supplements.              -9/26 ate a little better this weekend, 9/24 labs ok  -appreciate RD recs! Continue recommended supps 8. A flutter s/p DCCV: No in NSR on amiodarone and Eliquis             --Monitor HR/BP on TID basis  9/27- HR is doing well currently- con't regimen             Monitor with increased activity 9. Hypokalemia: Continue K dur for supplement             CMP reviewed 9/24  9/27- K+ 3.7- will recheck 2x/week  10. Neurogenic bladder: Monitor voiding with PVRs/bladder scans,             -9/26  still with urine retention, sporadic 50-1100cc -increase Urecholine to 271mbid    -reduce gabapentin  9/27- will restart Flomax 0.4 mg qsupper and monitor- didn't really get muchbefore went into Afib with  RVR  9/29- will increase to 0.8 mg as of Monday 11. 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 9/29- LBM this AM- con't regimen  12. Hyponatremia: Continues to vary from 131-134.               9/24: 133 13. N/V-   9/29- not constipated, but will con't Compazine prn.        LOS: 6 days A FACE TO FACE EVALUATION WAS PERFORMED  Shanell Aden 03/26/2021, 9:05 AM

## 2021-03-26 NOTE — Progress Notes (Signed)
Occupational Therapy Session Note  Patient Details  Name: Angela Morales MRN: [id number] Date of Birth: 10/14/1945  Today's Date: 03/26/2021 OT Individual Time: 0700-0810 OT Individual Time Calculation (min): 70 min    Short Term Goals: Week 1:  OT Short Term Goal 1 (Week 1): Pt will complete BSC or toilet trasnfer with 1 assist OT Short Term Goal 2 (Week 1): Pt will complete LB dressing with no more than Max A using adaptive techniques OT Short Term Goal 3 (Week 1): Pt will direct care for 1 OOB trasnfer given min cues in prep for directing care to caregivers during ADL routine at home  Skilled Therapeutic Interventions/Progress Updates:    Pt resting in bed upon arrival. OT intervention with focus on bed mobility, LB bathing/dressing bed level, SB transfers, UB bathing/dressing at w/c level, w/c mobility, and ongoing discharge planning. Min A for LB bathing at bed level. Max A for LB dressing at bed level. UB bathing/dressing with supervision. W/c mobility in ADL apartment with min verbal cues for technique. Discussed taking a shower tomorrow using [place] for tranfser to TTB. Pt returned to room and remained std w/c. All needs within reach.   Therapy Documentation Precautions:  Precautions Precautions: Fall Required Braces or Orthoses: Other Brace Other Brace: bilateral ankle air splints, PRAFOs Restrictions Weight Bearing Restrictions: No Pain:  Pt denies pain this morning   Therapy/Group: Individual Therapy  Leroy Libman 03/26/2021, 8:43 AM

## 2021-03-26 NOTE — Progress Notes (Signed)
Physical Therapy Session Note  Patient Details  Name: Angela Morales MRN: [id number] Date of Birth: 09-03-1945  Today's Date: 03/26/2021 PT Individual Time: 0920-1015 and 1300-1410 PT Individual Time Calculation (min): 55 min and 70 min  Short Term Goals: Week 1:  PT Short Term Goal 1 (Week 1): Pt will perform sit to stand with mod A of 1 PT Short Term Goal 2 (Week 1): Pt will perform SB transfer with min A PT Short Term Goal 3 (Week 1): Pt will initiate pre-gait activity with LRAD  Skilled Therapeutic Interventions/Progress Updates: Tx1: Pt presented in standard w/c agreeable to therapy. Pt c/o LBP but no intervention requested. Pt states has been in w/c for a little over an hour and no increased d/c. PTA moved w/c to hallway due to limited space in room and pt propelled to ortho gym to global strengthening and BUE strengthening. Pt then performed Slide board transfer to mat with PTA setting up slide board and then pt performing transfer with minA nearing CGA. Pt then participated in Sit to stand with [place] from 24" mat and then x3 stands in [place]. Pt was able to pull up to standing in [place] with CGA and increased effort noted. Pt was able to stand from elevated mat with modA. Pt noted with increased fatigue requiring increased rest break between bouts. Pt then participated in UE therex with 2lb dowel including chess press, shoulder flexion to 90, and arm circles 2 x 10. Once completed pt performed Slide board transfer back to w/c in same manner as prior. Pt then transported to day room and participated in Cybex Kinetron 90cm/sec 2 x 10 then at 80cm/sec x 10. Pt initially required minA for RLE activation however with repetition pt was able to perform independently. Pt then transported back to room and performed Slide board transfer to bed with minA. PTA doffed shoes and air casts and then pt performed sit to supine with modA for BLE management. Pt repositioned to comfort and left in bed with call  bell within reach, alarm on, and current needs met.   Tx2: Pt presented in bed with son present agreeable to therapy. Pt denies significant pain and son leaves shortly after PTA arrival. Pt performed supine to sit with minA and use of bed features. PTA donned air casts and shoes total A and pt was total A for Slide board set up. Pt then performed Slide board transfer to w/c with CGA. Pt transported to rehab gym for energy conservation. Performed Slide board transfer in same manner as prior with minA. Pt participated in seated LE therex as follows: LAQ 1lb cuff 2 x 12, hamstring pulls with red theraband 2 x 12, hip ER with red theraband 2 x 12. Pt then transitioned to supine with modA and participated in active assisted heel slides, SAQ, modified bridges from bolster and AA SLR x 10 bilaterally. Pt  noted to required increased effort for most exercises and periods of extended recovery time however pt was able to perform most activities and pleased with effort. Pt indicated that she had received grounds pass and was hoping to see dog and possible grandchildren later today . Suggested that pt stay up in TIS for comfort to which pt was agreeable. PTA obtained TIS from room and pt performed Slide board transfer with minA. Pt transported back to room and left in TIS with belt alarm on, call bell within reach and needs met.      Therapy Documentation Precautions:  Precautions Precautions:  Fall Required Braces or Orthoses: Other Brace Other Brace: bilateral ankle air splints, PRAFOs Restrictions Weight Bearing Restrictions: No General:   Vital Signs:   Pain: Pain Assessment Pain Scale: 0-10 Pain Score: 0-No pain   Therapy/Group: Individual Therapy  Anterio Scheel Lashawndra Lampkins, PTA  03/26/2021, 12:53 PM

## 2021-03-26 NOTE — Progress Notes (Signed)
Nutrition Follow-up  DOCUMENTATION CODES:   Non-severe (moderate) malnutrition in context of chronic illness  INTERVENTION:  Boost Breeze po QID, each supplement provides 250 kcal and 9 grams of protein   ProSource Plus 30 ml po BID, each supplement provides 100 kcal and 15 grams of protein   Encourage PO intake  NUTRITION DIAGNOSIS:   Moderate Malnutrition related to chronic illness (GBS) as evidenced by mild fat depletion, mild muscle depletion; ongoing  GOAL:   Patient will meet greater than or equal to 90% of their needs; progressing  MONITOR:   PO intake, Supplement acceptance, Labs, Weight trends, Skin, I & O's  REASON FOR ASSESSMENT:   Consult Poor PO  ASSESSMENT:   75 year old female with PMH of severe lumbar scoliosis, celiac disease, atrial fibrillation, SVT. 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 Pt was treated with IVIG but continued to worsen with aspiration event requiring intubation. Pt required tracheostomy, was weaned to ATC, and was ultimately decannulated by 9/06. Pt continued to have significant impairments in mobility and ADLs and was admitted to CIR on 03/04/21. Pt was making progress but developed atrial flutter with hypotension on 03/13/21 requiring discharge to acute hospital for telemetry/management. Pt underwent DC cardioversion on 03/16/21 and remains in NSR. Pt re-admitted to CIR on 9/23.  Meal completion has been 70-90%. Pt reports appetite has been good and she has been tolerating her PO diet. Family at bedside has been bringing in food from home/outside that pt prefers to eat. Pt currently has Boost Breeze and Prosource plus ordered and has been consuming them. RD to continue with current orders to aid in caloric and protein needs.   NUTRITION - FOCUSED PHYSICAL EXAM:  Flowsheet Row Most Recent Value  Orbital Region Mild depletion  Upper Arm Region Moderate depletion  Thoracic and  Lumbar Region Unable to assess  Buccal Region Mild depletion  Temple Region Moderate depletion  Clavicle Bone Region Mild depletion  Clavicle and Acromion Bone Region Mild depletion  Scapular Bone Region Unable to assess  Dorsal Hand Unable to assess  Patellar Region Unable to assess  Anterior Thigh Region Unable to assess  Posterior Calf Region Unable to assess  Edema (RD Assessment) None  Hair Reviewed  Eyes Reviewed  Mouth Reviewed  Skin Reviewed  Nails Reviewed       Labs and medications reviewed.   Diet Order:   Diet Order             Diet gluten free Room service appropriate? Yes; Fluid consistency: Thin  Diet effective now                   EDUCATION NEEDS:   No education needs have been identified at this time  Skin:  Skin Assessment: Reviewed RN Assessment  Last BM:  9/26  Height:   Ht Readings from Last 1 Encounters:  03/20/21 5' 4"  (1.626 m)    Weight:   Wt Readings from Last 1 Encounters:  03/16/21 64 kg    BMI:  Body mass index is 24.22 kg/m.  Estimated Nutritional Needs:   Kcal:  1800-2000  Protein:  90-110 grams  Fluid:  >/= 1.8 L  Corrin Parker, MS, RD, LDN RD pager number/after hours weekend pager number on Amion.

## 2021-03-27 MED ORDER — HYDROCODONE-ACETAMINOPHEN 5-325 MG PO TABS
1.0000 | ORAL_TABLET | ORAL | Status: DC | PRN
  Administered 2021-03-27 – 2021-03-31 (×4): 1 via ORAL
  Filled 2021-03-27 (×4): qty 1

## 2021-03-27 NOTE — Progress Notes (Signed)
Occupational Therapy Session Note  Patient Details  Name: Angela Morales MRN: [id number] Date of Birth: Sep 02, 1945  Today's Date: 03/27/2021 OT Individual Time: 0700-0815 OT Individual Time Calculation (min): 75 min    Short Term Goals: Week 2:  OT Short Term Goal 1 (Week 2): Pt will complete LB dressing with no more than Max A using adaptive techniques OT Short Term Goal 2 (Week 2): Pt will perform SB transfers with min A OT Short Term Goal 3 (Week 2): Pt will perform toileting tasks seated on BSC with max A  Skilled Therapeutic Interventions/Progress Updates:    OT intervention with focus on bed mobility, sitting balance, sit<>stand in [place], standing balance in [place], bathing at shower level with AE PRN, dressing w/c level with sit<>stand in [place], activity tolerance, safety awareness, and ongoing discharge planning to increse independence with BADLs. Supine>sit EOB and scoot forward to place BLE on floor with supervision using bed functions. Sit<>stand in [place] with mod A. Bathing at shower level seated on TTB. Pt required assistance to bathe [place] feet and buttocks. LB dressing with tot A for time mgmt. Pt was able to wash and dry hair without assistance. Pt remained seated in w/c with all needs within reach.   Therapy Documentation Precautions:  Precautions Precautions: Fall Required Braces or Orthoses: Other Brace Other Brace: bilateral ankle air splints, PRAFOs Restrictions Weight Bearing Restrictions: No Pain: Pain Assessment Pain Scale: 0-10 Pain Score: 0-No pain   Therapy/Group: Individual Therapy  Leroy Libman 03/27/2021, 8:59 AM

## 2021-03-27 NOTE — Progress Notes (Signed)
Physical Therapy Session Note  Patient Details  Name: Angela Morales MRN: [id number] Date of Birth: 28-Sep-1945  Today's Date: 03/27/2021 PT Individual Time: 0850-1000 and 1410-1440 PT Individual Time Calculation (min): 70 min and 30 min  Short Term Goals: Week 1:  PT Short Term Goal 1 (Week 1): Pt will perform sit to stand with mod A of 1 PT Short Term Goal 2 (Week 1): Pt will perform SB transfer with min A PT Short Term Goal 3 (Week 1): Pt will initiate pre-gait activity with LRAD  Skilled Therapeutic Interventions/Progress Updates: Tx1: Pt presented in w/c agreeable to therapy. Pt denies significant pain. Pt propelled from room to nsg station with supervision and increased time. Pt transported remaining distance to rehab gym. Pt participated in Sit to stand in parallel bars x 3 with modA. While in standing pt was able to perform small TKE alternating in BLE. Pt then moved to mat and was able to perform Slide board transfer with CGA with PTA providing total A for set up. Pt then participated in forward weight shifting with RUE on standard chair and LUE on mat and performing initial movement for squats/stands. Pt was able to perform x 5 with CGA and noted some increased quad recruitment however significant L ankle inversion. PTA placed beach ball between legs to decrease adduction with some improvement. Pt then performed same activity with PTA in front of pt and pt using BUE on mat with pt able to successfully clear buttocks from mat x 3. Pt also participated in ball kicks using BLE for quad activation and seated dynamic balance. Pt noted to have increased L inversion with activity. PTA provided gentle stretch to L heel cord and inverters to decrease risk of contracture as pt indicated does not stay in neutral while in [facility]. Pt then performed Slide board transfer to w/c and then transported back to room. In room performed Slide board transfer with minA due to fatigue to bed and PTA doffed shoes/air  casts. Pt required minA for BLE management to supine and required minA to reposition to comfort. Pt left in bed at end of session with bed alarm on, call bell within reach and needs met.   Tx2: Pt presented in bed agreeable to therapy. Pt currently denies pain but states that attempted Sit to stand transfer in [place] earlier without shoes and ankle rolled. Pt received pain meds after incident. PTA inspected ankle  no swelling or discoloration noted. Pt performed supine to sit with minA and use of bed features. Pt primarily required assist for BLE management. PTA donned shoes and air casts total A. Pt then performed squat pivot transfer to w/c with modA and PTA providing facilitation for upright posture and increased anterior weight shifting to allow increased load on BLE. Squat was performed in segmental parts requiring increased time. Once in w/c PTA set up leg rests and transported to day room for time management. Pt participated in Cybex Kinetron 80cm/sec 3 bouts 1 min each for general conditioning, reciprocal activity, and BLE strengthening. Pt then transported to main hallway and propelled w/c ~169f with supervision. Pt transported remaining distance to room and left in w/c per pt request. Pt left with call bell within reach and current needs met.      Therapy Documentation Precautions:  Precautions Precautions: Fall Required Braces or Orthoses: Other Brace Other Brace: bilateral ankle air splints, PRAFOs Restrictions Weight Bearing Restrictions: No General:   Vital Signs:   Pain: Pain Assessment Pain Scale: 0-10 Pain  Score: 0-No pain Mobility:   Locomotion :    Trunk/Postural Assessment :    Balance:   Exercises:   Other Treatments:      Therapy/Group: Individual Therapy  Leopold Smyers 03/27/2021, 10:39 AM

## 2021-03-27 NOTE — Progress Notes (Signed)
Occupational Therapy Weekly Progress Note  Patient Details  Name: Angela Morales MRN: [id number] Date of Birth: 15-Mar-1946  Beginning of progress report period: March 22, 2021 End of progress report period: March 27, 2021   Patient has met 2 of 3 short term goals.  Pt has made steady progress with BADLs and functional transfers since admission. Supine>sit EOB with supervision using bed functions. DABSC transfers with SB requiring mod A. Currently tot A for toileting on BSC. LB dressing with tot A at bed level and with sit<>stand in [place]. Sit<>stand in [place] with mod A. Pt is able to use BUE to wash and dry her hair without assistance.   Patient continues to demonstrate the following deficits: muscle weakness and muscle joint tightness, decreased cardiorespiratoy endurance, impaired timing and sequencing, abnormal tone, unbalanced muscle activation, and decreased motor planning, and decreased sitting balance, decreased standing balance, decreased postural control, and decreased balance strategies and therefore will continue to benefit from skilled OT intervention to enhance overall performance with BADL and Reduce care partner burden.  Patient progressing toward long term goals..  Continue plan of care.  OT Short Term Goals Week 1:  OT Short Term Goal 1 (Week 1): Pt will complete BSC or toilet trasnfer with 1 assist OT Short Term Goal 1 - Progress (Week 1): Met OT Short Term Goal 2 (Week 1): Pt will complete LB dressing with no more than Max A using adaptive techniques OT Short Term Goal 2 - Progress (Week 1): Progressing toward goal OT Short Term Goal 3 (Week 1): Pt will direct care for 1 OOB trasnfer given min cues in prep for directing care to caregivers during ADL routine at home OT Short Term Goal 3 - Progress (Week 1): Met Week 2:  OT Short Term Goal 1 (Week 2): Pt will complete LB dressing with no more than Max A using adaptive techniques OT Short Term Goal 2 (Week 2): Pt  will perform SB transfers with min A OT Short Term Goal 3 (Week 2): Pt will perform toileting tasks seated on BSC with max A   Leroy Libman 03/27/2021, 9:05 AM

## 2021-03-27 NOTE — Progress Notes (Signed)
PROGRESS NOTE   Subjective/Complaints:  Pt reports wasn't able to do grounds pass yesterday- trying for Monday/Tuesday.   Needs cheerios- got while in was in room.   Pain meds "slow her down" so doesn't always take before therapy- limits therapy.     ROS:   Pt denies SOB, abd pain, CP, N/V/C/D, and vision changes    Objective:   No results found. No results for input(s): WBC, HGB, HCT, PLT in the last 72 hours.  No results for input(s): NA, K, CL, CO2, GLUCOSE, BUN, CREATININE, CALCIUM in the last 72 hours.   Intake/Output Summary (Last 24 hours) at 03/27/2021 0855 Last data filed at 03/26/2021 1900 Gross per 24 hour  Intake 417 ml  Output --  Net 417 ml        Physical Exam: Vital Signs Blood pressure 117/65, pulse 85, temperature 98 F (36.7 C), temperature source Oral, resp. rate 19, height 5' 4"  (1.626 m), weight 60.1 kg, last menstrual period 04/20/2005, SpO2 98 %.       General: awake, alert, appropriate, sitting up in bed; got breakfast; NAD HENT: conjugate gaze; oropharynx moist CV: regular rate; no JVD Pulmonary: CTA B/L; no W/R/R- good air movement GI: soft, NT, ND, (+)BS; hypoactive Psychiatric: appropriate Neurological: Ox3 Neuro:  Alert and oriented x 3. Normal insight and awareness. Intact Memory. Normal language and speech. Cranial nerve exam unremarkable. UE 4+/5. LE: 4/5 prox to 3+ to 4-/5 distally. Stocking glove sensory loss below knees bilaterally. DTR's absent. Neuro stable. Musculoskeletal: Full ROM, No pain with AROM or PROM in the neck, trunk, or extremities. Posture appropriate   L foot- has rotation/internal of L foot at rest- peroneus?  Assessment/Plan: 1. Functional deficits which require 3+ hours per day of interdisciplinary therapy in a comprehensive inpatient rehab setting. Physiatrist is providing close team supervision and 24 hour management of active medical problems  listed below. Physiatrist and rehab team continue to assess barriers to discharge/monitor patient progress toward functional and medical goals  Care Tool:  Bathing    Body parts bathed by patient: Right arm, Left arm, Chest, Abdomen, Front perineal area, Right upper leg, Left upper leg, Face   Body parts bathed by helper: Buttocks, Right lower leg, Left lower leg     Bathing assist Assist Level: Moderate Assistance - Patient 50 - 74%     Upper Body Dressing/Undressing Upper body dressing   What is the patient wearing?: Pull over shirt    Upper body assist Assist Level: Set up assist    Lower Body Dressing/Undressing Lower body dressing      What is the patient wearing?: Incontinence brief, Pants     Lower body assist Assist for lower body dressing: Maximal Assistance - Patient 25 - 49%     Toileting Toileting    Toileting assist Assist for toileting: Total Assistance - Patient < 25%     Transfers Chair/bed transfer  Transfers assist     Chair/bed transfer assist level: Minimal Assistance - Patient > 75% Chair/bed transfer assistive device: Sliding board   Locomotion Ambulation   Ambulation assist   Ambulation activity did not occur: Safety/medical concerns  Walk 10 feet activity   Assist  Walk 10 feet activity did not occur: Safety/medical concerns        Walk 50 feet activity   Assist Walk 50 feet with 2 turns activity did not occur: Safety/medical concerns         Walk 150 feet activity   Assist Walk 150 feet activity did not occur: Safety/medical concerns         Walk 10 feet on uneven surface  activity   Assist Walk 10 feet on uneven surfaces activity did not occur: Safety/medical concerns         Wheelchair     Assist Is the patient using a wheelchair?: Yes Type of Wheelchair: Manual Wheelchair activity did not occur: Safety/medical concerns  Wheelchair assist level: Supervision/Verbal cueing Max  wheelchair distance: 100'    Wheelchair 50 feet with 2 turns activity    Assist    Wheelchair 50 feet with 2 turns activity did not occur: Safety/medical concerns   Assist Level: Supervision/Verbal cueing   Wheelchair 150 feet activity     Assist  Wheelchair 150 feet activity did not occur: Safety/medical concerns       Blood pressure 117/65, pulse 85, temperature 98 F (36.7 C), temperature source Oral, resp. rate 19, height 5' 4"  (1.626 m), weight 60.1 kg, last menstrual period 04/20/2005, SpO2 98 %.  Medical Problem List and Plan: 1.  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             -patient may shower             -ELOS/Goals: 17-20 days/min a             -d/c 10/12  9/30- con't PT and OT 2.  Antithrombotics: -DVT/anticoagulation:  Pharmaceutical: Other (comment) --Eliquis             -antiplatelet therapy: N/A 3. Pain Management: Oxycodone  prn. Using every other day at this point             -reduce gabapentin to 367m qhs d/t dry mouth  9/30- will change Oxy to Norco 5/325 mg q4 hours prn since "too strong" sometimes- and makes her sleepy/slowed- so will see if helps.  4. Mood: LCSW to follow for evaluation and support.              -antipsychotic agents: N/A 5. Neuropsych: This patient is capable of making decisions on her own behalf. 6. Skin/Wound Care: Routine pressure relief measures.  7. Fluids/Electrolytes/Nutrition: Monitor I/Os.  Does not like nutritional supplements.              -9/26 ate a little better this weekend, 9/24 labs ok  -appreciate RD recs! Continue recommended supps 8. A flutter s/p DCCV: No in NSR on amiodarone and Eliquis             --Monitor HR/BP on TID basis  9/27- HR is doing well currently- con't regimen             Monitor with increased activity 9. Hypokalemia: Continue K dur for supplement             CMP reviewed 9/24  9/27- K+ 3.7- will recheck 2x/week  10.  Neurogenic bladder: Monitor voiding with PVRs/bladder scans,             -9/26 still with urine retention, sporadic 50-1100cc -increase Urecholine to 252mbid    -reduce gabapentin  9/27- will  restart Flomax 0.4 mg qsupper and monitor- didn't really get muchbefore went into Afib with RVR  9/30- will increase Flomax on Monday to 0.8 mg q supper- no voiding yet.  11. 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 9/29- LBM this AM- con't regimen  12. Hyponatremia: Continues to vary from 131-134.               9/26 133 13. N/V-   9/29- not constipated, but will con't Compazine prn.        LOS: 7 days A FACE TO FACE EVALUATION WAS PERFORMED  Angela Morales 03/27/2021, 8:55 AM

## 2021-03-29 NOTE — Progress Notes (Signed)
Occupational Therapy Session Note  Patient Details  Name: Angela Morales MRN: [id number] Date of Birth: 02-26-1946  Today's Date: 03/29/2021 OT Individual Time: 8786-7672 OT Individual Time Calculation (min): 55 min    Short Term Goals: Week 2:  OT Short Term Goal 1 (Week 2): Pt will complete LB dressing with no more than Max A using adaptive techniques OT Short Term Goal 2 (Week 2): Pt will perform SB transfers with min A OT Short Term Goal 3 (Week 2): Pt will perform toileting tasks seated on BSC with max A  Skilled Therapeutic Interventions/Progress Updates:    Pt resting in bed upon arrival and ready to get OOB. Pt states that staff did not help her get OOB the previous day. LB bathing at bed level with assistance for buttocks and feet. Supine>sit EOB with supervision using bed functions. Pt threaded BLE into pants usig reacher while seated EOB. Sit<>stand in [place] with mod A from bed and supervision from paddles. Pt initiated pulling pants up but required assistance to pull over hips in back. Tot A for donning socks, air splints, and shoes. PT completed UB bathing/dressing seated in w/c at sink. Pt remained seated in w/c with all needs withn reach. Improved sitting balance and BLE active movement noted.   Therapy Documentation Precautions:  Precautions Precautions: Fall Required Braces or Orthoses: Other Brace Other Brace: bilateral ankle air splints, PRAFOs Restrictions Weight Bearing Restrictions: No Pain:  Pt denies pain this morning   Therapy/Group: Individual Therapy  Leroy Libman 03/29/2021, 9:43 AM

## 2021-03-29 NOTE — Progress Notes (Signed)
PROGRESS NOTE   Subjective/Complaints:  Pt c/o nausea yesterday- hasn't taken pain meds, so doesn't think it's that- denies constipation.   Also wants prosource stopped- it's "tastes awful".     ROS:   Pt denies SOB, abd pain, CP, (+) N/V/C/D, and vision changes   Objective:   No results found. No results for input(s): WBC, HGB, HCT, PLT in the last 72 hours.  No results for input(s): NA, K, CL, CO2, GLUCOSE, BUN, CREATININE, CALCIUM in the last 72 hours.   Intake/Output Summary (Last 24 hours) at 03/29/2021 1100 Last data filed at 03/28/2021 1900 Gross per 24 hour  Intake 354 ml  Output --  Net 354 ml        Physical Exam: Vital Signs Blood pressure 105/62, pulse 76, temperature 98.5 F (36.9 C), temperature source Oral, resp. rate 18, height 5' 4"  (1.626 m), weight 60.1 kg, last menstrual period 04/20/2005, SpO2 97 %.        General: awake, alert, appropriate, sitting up in bed; NAD HENT: conjugate gaze; oropharynx moist CV: regular rate; no JVD Pulmonary: CTA B/L; no W/R/R- good air movement GI: soft, NT, ND, (+)BS Psychiatric: appropriate Neurological: Ox3  Neuro:  Alert and oriented x 3. Normal insight and awareness. Intact Memory. Normal language and speech. Cranial nerve exam unremarkable. UE 4+/5. LE: 4/5 prox to 3+ to 4-/5 distally. Stocking glove sensory loss below knees bilaterally. DTR's absent. Neuro stable. Musculoskeletal: Full ROM, No pain with AROM or PROM in the neck, trunk, or extremities. Posture appropriate   L foot- has rotation/internal of L foot at rest- peroneus?  Assessment/Plan: 1. Functional deficits which require 3+ hours per day of interdisciplinary therapy in a comprehensive inpatient rehab setting. Physiatrist is providing close team supervision and 24 hour management of active medical problems listed below. Physiatrist and rehab team continue to assess barriers to  discharge/monitor patient progress toward functional and medical goals  Care Tool:  Bathing    Body parts bathed by patient: Right arm, Left arm, Chest, Abdomen, Front perineal area, Right upper leg, Left upper leg, Face   Body parts bathed by helper: Buttocks, Right lower leg, Left lower leg     Bathing assist Assist Level: Moderate Assistance - Patient 50 - 74%     Upper Body Dressing/Undressing Upper body dressing   What is the patient wearing?: Pull over shirt    Upper body assist Assist Level: Set up assist    Lower Body Dressing/Undressing Lower body dressing      What is the patient wearing?: Incontinence brief, Pants     Lower body assist Assist for lower body dressing: Moderate Assistance - Patient 50 - 74%     Toileting Toileting    Toileting assist Assist for toileting: Total Assistance - Patient < 25%     Transfers Chair/bed transfer  Transfers assist     Chair/bed transfer assist level: Minimal Assistance - Patient > 75% Chair/bed transfer assistive device: Sliding board   Locomotion Ambulation   Ambulation assist   Ambulation activity did not occur: Safety/medical concerns          Walk 10 feet activity   Assist  Walk 10  feet activity did not occur: Safety/medical concerns        Walk 50 feet activity   Assist Walk 50 feet with 2 turns activity did not occur: Safety/medical concerns         Walk 150 feet activity   Assist Walk 150 feet activity did not occur: Safety/medical concerns         Walk 10 feet on uneven surface  activity   Assist Walk 10 feet on uneven surfaces activity did not occur: Safety/medical concerns         Wheelchair     Assist Is the patient using a wheelchair?: Yes Type of Wheelchair: Manual Wheelchair activity did not occur: Safety/medical concerns  Wheelchair assist level: Supervision/Verbal cueing Max wheelchair distance: 100'    Wheelchair 50 feet with 2 turns  activity    Assist    Wheelchair 50 feet with 2 turns activity did not occur: Safety/medical concerns   Assist Level: Supervision/Verbal cueing   Wheelchair 150 feet activity     Assist  Wheelchair 150 feet activity did not occur: Safety/medical concerns       Blood pressure 105/62, pulse 76, temperature 98.5 F (36.9 C), temperature source Oral, resp. rate 18, height 5' 4"  (1.626 m), weight 60.1 kg, last menstrual period 04/20/2005, SpO2 97 %.  Medical Problem List and Plan: 1.  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             -patient may shower             -ELOS/Goals: 17-20 days/min a             -d/c 10/12  10/2- Con't CIR- PT and OT 2.  Antithrombotics: -DVT/anticoagulation:  Pharmaceutical: Other (comment) --Eliquis             -antiplatelet therapy: N/A 3. Pain Management: Oxycodone  prn. Using every other day at this point             -reduce gabapentin to 383m qhs d/t dry mouth  9/30- will change Oxy to Norco 5/325 mg q4 hours prn since "too strong" sometimes- and makes her sleepy/slowed- so will see if helps.   10/2- hasn't taken- will encourage her to take with food.  4. Mood: LCSW to follow for evaluation and support.              -antipsychotic agents: N/A 5. Neuropsych: This patient is capable of making decisions on her own behalf. 6. Skin/Wound Care: Routine pressure relief measures.  7. Fluids/Electrolytes/Nutrition: Monitor I/Os.  Does not like nutritional supplements.              -9/26 ate a little better this weekend, 9/24 labs ok  -appreciate RD recs! Continue recommended supps  10/2- will stop prosource per pt request.  8. A flutter s/p DCCV: No in NSR on amiodarone and Eliquis             --Monitor HR/BP on TID basis  9/27- HR is doing well currently- con't regimen             Monitor with increased activity 9. Hypokalemia: Continue K dur for supplement             CMP reviewed  9/24  9/27- K+ 3.7- will recheck 2x/week  10. Neurogenic bladder: Monitor voiding with PVRs/bladder scans,             -9/26 still with urine retention, sporadic 50-1100cc -increase  Urecholine to 29m bid    -reduce gabapentin  9/27- will restart Flomax 0.4 mg qsupper and monitor- didn't really get muchbefore went into Afib with RVR  9/30- will increase Flomax on Monday to 0.8 mg q supper- no voiding yet.   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 9/29- LBM this AM- con't regimen  12. Hyponatremia: Continues to vary from 131-134.               9/26 133 13. N/V-   9/29- not constipated, but will con't Compazine prn.   10/2- still having nausea- not sure why- if lingers, might get KUB       LOS: 9 days A FACE TO FACE EVALUATION WAS PERFORMED  Angela Morales 03/29/2021, 11:00 AM

## 2021-03-29 NOTE — Progress Notes (Signed)
Patient utilized grounds pass today and went downstairs with family. Arrived and in bed with no issues.

## 2021-03-30 LAB — CBC
HCT: 39.7 % (ref 36.0–46.0)
Hemoglobin: 13.1 g/dL (ref 12.0–15.0)
MCH: 31.6 pg (ref 26.0–34.0)
MCHC: 33 g/dL (ref 30.0–36.0)
MCV: 95.7 fL (ref 80.0–100.0)
Platelets: 270 10*3/uL (ref 150–400)
RBC: 4.15 MIL/uL (ref 3.87–5.11)
RDW: 14.6 % (ref 11.5–15.5)
WBC: 6.6 10*3/uL (ref 4.0–10.5)
nRBC: 0 % (ref 0.0–0.2)

## 2021-03-30 LAB — BASIC METABOLIC PANEL
Anion gap: 9 (ref 5–15)
BUN: 7 mg/dL — ABNORMAL LOW (ref 8–23)
CO2: 25 mmol/L (ref 22–32)
Calcium: 8.9 mg/dL (ref 8.9–10.3)
Chloride: 101 mmol/L (ref 98–111)
Creatinine, Ser: 0.48 mg/dL (ref 0.44–1.00)
GFR, Estimated: >60 mL/min (ref >60)
Glucose, Bld: 87 mg/dL (ref 70–99)
Potassium: 3.8 mmol/L (ref 3.5–5.1)
Sodium: 135 mmol/L (ref 135–145)

## 2021-03-30 LAB — GLUCOSE, CAPILLARY: Glucose-Capillary: 96 mg/dL (ref 70–99)

## 2021-03-30 MED ORDER — TAMSULOSIN HCL 0.4 MG PO CAPS
0.8000 mg | ORAL_CAPSULE | Freq: Every day | ORAL | Status: DC
  Administered 2021-03-30 – 2021-04-07 (×9): 0.8 mg via ORAL
  Filled 2021-03-30 (×9): qty 2

## 2021-03-30 NOTE — Progress Notes (Signed)
PROGRESS NOTE   Subjective/Complaints:   Pt reports it's hard to get KCL down- will see if can change to pill form- based on Kcl level, cannot stop KCL.   They decided against AFOs- AFO consult last week- using air splints.     ROS:   Pt denies SOB, abd pain, CP, N/V/C/D, and vision changes   Objective:   No results found. Recent Labs    03/30/21 0559  WBC 6.6  HGB 13.1  HCT 39.7  PLT 270    Recent Labs    03/30/21 0559  NA 135  K 3.8  CL 101  CO2 25  GLUCOSE 87  BUN 7*  CREATININE 0.48  CALCIUM 8.9     Intake/Output Summary (Last 24 hours) at 03/30/2021 1009 Last data filed at 03/30/2021 0745 Gross per 24 hour  Intake 236 ml  Output --  Net 236 ml        Physical Exam: Vital Signs Blood pressure (!) 129/59, pulse 77, temperature 97.9 F (36.6 C), resp. rate 14, height 5' 4"  (1.626 m), weight 60.1 kg, last menstrual period 04/20/2005, SpO2 97 %.         General: awake, alert, appropriate, sitting up in bed; OTA in room; NAD HENT: conjugate gaze; oropharynx moist CV: regular rate; no JVD Pulmonary: CTA B/L; no W/R/R- good air movement GI: soft, NT, ND, (+)BS Psychiatric: appropriate; interactive Neurological: Ox3   Neuro:  Alert and oriented x 3. Normal insight and awareness. Intact Memory. Normal language and speech. Cranial nerve exam unremarkable. UE 4+/5. LE: 4/5 prox to 3+ to 4-/5 distally. Stocking glove sensory loss below knees bilaterally. DTR's absent. Neuro stable. Musculoskeletal: Full ROM, No pain with AROM or PROM in the neck, trunk, or extremities. Posture appropriate   L foot- has rotation/internal of L foot at rest- peroneus?  Assessment/Plan: 1. Functional deficits which require 3+ hours per day of interdisciplinary therapy in a comprehensive inpatient rehab setting. Physiatrist is providing close team supervision and 24 hour management of active medical problems  listed below. Physiatrist and rehab team continue to assess barriers to discharge/monitor patient progress toward functional and medical goals  Care Tool:  Bathing    Body parts bathed by patient: Right arm, Left arm, Chest, Abdomen, Front perineal area, Right upper leg, Left upper leg, Face   Body parts bathed by helper: Buttocks, Right lower leg, Left lower leg Body parts n/a: Buttocks, Right lower leg, Left lower leg   Bathing assist Assist Level: Moderate Assistance - Patient 50 - 74%     Upper Body Dressing/Undressing Upper body dressing   What is the patient wearing?: Pull over shirt    Upper body assist Assist Level: Set up assist    Lower Body Dressing/Undressing Lower body dressing      What is the patient wearing?: Incontinence brief, Pants     Lower body assist Assist for lower body dressing: Moderate Assistance - Patient 50 - 74%     Toileting Toileting    Toileting assist Assist for toileting: Maximal Assistance - Patient 25 - 49%     Transfers Chair/bed transfer  Transfers assist     Chair/bed transfer assist  level: Minimal Assistance - Patient > 75% Chair/bed transfer assistive device: Sliding board   Locomotion Ambulation   Ambulation assist   Ambulation activity did not occur: Safety/medical concerns          Walk 10 feet activity   Assist  Walk 10 feet activity did not occur: Safety/medical concerns        Walk 50 feet activity   Assist Walk 50 feet with 2 turns activity did not occur: Safety/medical concerns         Walk 150 feet activity   Assist Walk 150 feet activity did not occur: Safety/medical concerns         Walk 10 feet on uneven surface  activity   Assist Walk 10 feet on uneven surfaces activity did not occur: Safety/medical concerns         Wheelchair     Assist Is the patient using a wheelchair?: Yes Type of Wheelchair: Manual Wheelchair activity did not occur: Safety/medical  concerns  Wheelchair assist level: Supervision/Verbal cueing Max wheelchair distance: 100'    Wheelchair 50 feet with 2 turns activity    Assist    Wheelchair 50 feet with 2 turns activity did not occur: Safety/medical concerns   Assist Level: Supervision/Verbal cueing   Wheelchair 150 feet activity     Assist  Wheelchair 150 feet activity did not occur: Safety/medical concerns       Blood pressure (!) 129/59, pulse 77, temperature 97.9 F (36.6 C), resp. rate 14, height 5' 4"  (1.626 m), weight 60.1 kg, last menstrual period 04/20/2005, SpO2 97 %.  Medical Problem List and Plan: 1.  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             -patient may shower             -ELOS/Goals: 17-20 days/min a             -d/c 10/12  10/3- con't PT and OT- CIR- and air casts- no AFOs. Wearing PRAFOs at night.  2.  Antithrombotics: -DVT/anticoagulation:  Pharmaceutical: Other (comment) --Eliquis             -antiplatelet therapy: N/A 3. Pain Management: Oxycodone  prn. Using every other day at this point             -reduce gabapentin to 337m qhs d/t dry mouth  9/30- will change Oxy to Norco 5/325 mg q4 hours prn since "too strong" sometimes- and makes her sleepy/slowed- so will see if helps.   10/2- hasn't taken- will encourage her to take with food.   10/3- pain adequate per pt- but encourage to take Norco prn- con't regimen 4. Mood: LCSW to follow for evaluation and support.              -antipsychotic agents: N/A 5. Neuropsych: This patient is capable of making decisions on her own behalf. 6. Skin/Wound Care: Routine pressure relief measures.  7. Fluids/Electrolytes/Nutrition: Monitor I/Os.  Does not like nutritional supplements.              -9/26 ate a little better this weekend, 9/24 labs ok  10/3- d/c'd Prosource per pt request-   -KCL as below.  8. A flutter s/p DCCV: No in NSR on amiodarone and Eliquis              --Monitor HR/BP on TID basis  9/27- HR is doing well currently- con't regimen  Monitor with increased activity 9. Hypokalemia: Continue K dur for supplement             CMP reviewed 9/24  9/27- K+ 3.7- will recheck 2x/week   10/3- pt wants to stop KCL, however is maintaining K+ of 3.8 on 60 mEq daily- so don't feel comfortable changing dose- will ask if she wants to change to pills? 10. 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 9/29- LBM this AM- con't regimen   10/3- going regularly, per pt- con't reigmen 12. Hyponatremia: Continues to vary from 131-134.               9/26 133  10/3- Na 135- con't to monitor 13. N/V-   9/29- not constipated, but will con't Compazine prn.   10/2- still having nausea- not sure why- if lingers, might get KUB       LOS: 10 days A FACE TO FACE EVALUATION WAS PERFORMED  Moria Brophy 03/30/2021, 10:09 AM

## 2021-03-30 NOTE — Progress Notes (Signed)
Occupational Therapy Session Note  Patient Details  Name: Angela Morales MRN: [id number] Date of Birth: Nov 27, 1945  Today's Date: 03/30/2021 OT Individual Time: 0700-0812 OT Individual Time Calculation (min): 72 min    Short Term Goals: Week 2:  OT Short Term Goal 1 (Week 2): Pt will complete LB dressing with no more than Max A using adaptive techniques OT Short Term Goal 2 (Week 2): Pt will perform SB transfers with min A OT Short Term Goal 3 (Week 2): Pt will perform toileting tasks seated on BSC with max A  Skilled Therapeutic Interventions/Progress Updates:    OT intervention with focus on bed mobility, DABSC transfers, LB dressing EOB, sit<>stand in [place], w/c mobility, and BUE strengthening Supine>sit EOB with supervision. SB transfer EOB<>DABSC with min A/CGA. Toileting with max A. Pt able to thread BLE into pants using reacher. Sit<>stand in [place] with mod A. Pt completed UB bathing/dressing w/c level with supervision. Pt propelled w/c to ADL apartment and practiced w/c mobility in confined envirionment with supervision. Pt performed stratigh arm raises 3x10 with 3# bar. Pt returned to room and remained in w/c with all needs within reach.  Therapy Documentation Precautions:    Pain:  Pt denies pain this morning   Therapy/Group: Individual Therapy  Leroy Libman 03/30/2021, 8:18 AM

## 2021-03-30 NOTE — Progress Notes (Signed)
Occupational Therapy Session Note  Patient Details  Name: QUINTESSA SIMMERMAN MRN: [id number] Date of Birth: [birth year]  Today's Date: 03/30/2021 OT Individual Time: 1100-1155 OT Individual Time Calculation (min): 55 min    Short Term Goals: Week 2:  OT Short Term Goal 1 (Week 2): Pt will complete LB dressing with no more than Max A using adaptive techniques OT Short Term Goal 2 (Week 2): Pt will perform SB transfers with min A OT Short Term Goal 3 (Week 2): Pt will perform toileting tasks seated on BSC with max A  Skilled Therapeutic Interventions/Progress Updates:    Pt resting in w/c upon arrival. Pt transported to gym (time mgmt). Initial focus on BUE therex for endurance and strengthening. 5 mins on [employer] level 3. Pt commented that she felt like her RUE was doing all the work. Pt transitioned to main gym to practice squat pivot transfers. SB transfer to EOM with supervisoin after SB placement. Focus on small squat from EOM to facilitate buttock clearance and scooting to R/L with min A/CGA. Pt fatigues quickly. Pt practiced transfers EOM<>w/c with SB for resting. Pt completed transfers with 3 squats each direction with rest on SB. Pt performed 3 tranfsers with CGA. Pt returned to room and remained in w/c with all needs wihtin reach.   Therapy Documentation Precautions:  Precautions Precautions: Fall Required Braces or Orthoses: Other Brace Other Brace: bilateral ankle air splints, PRAFOs Restrictions Weight Bearing Restrictions: No  Pain: Pain Assessment Pain Scale: 0-10 Pain Score: 0-No pain   Therapy/Group: Individual Therapy  Leroy Libman 03/30/2021, 12:06 PM

## 2021-03-30 NOTE — Progress Notes (Signed)
Physical Therapy Session Note  Patient Details  Name: Angela Morales MRN: [id number] Date of Birth: 1945-10-11  Today's Date: 03/30/2021 PT Individual Time: 1345-1500 PT Individual Time Calculation (min): 75 min   Short Term Goals: Week 1:  PT Short Term Goal 1 (Week 1): Pt will perform sit to stand with mod A of 1 PT Short Term Goal 2 (Week 1): Pt will perform SB transfer with min A PT Short Term Goal 3 (Week 1): Pt will initiate pre-gait activity with LRAD  Skilled Therapeutic Interventions/Progress Updates:    Pt received seated in w/c in room, agreeable to PT session. No complaints of pain at rest, has onset of R hip/low back pain with mobility that improves at rest. Pt requests pain medication at end of session, nursing notified. Manual w/c propulsion x 100 ft with use of BUE at Supervision level before onset of fatigue. Sit to stand x 5 reps in // bars with mod A and cues for anterior weight shift. Standing mini-squats 2 x 5 reps, alt L/R marches 4 x 5 reps with min A for standing balance. Pt exhibits decreased control of LLE as compared to RLE with onset of L ankle inversion in standing. ACE-wrapped L ankle into DF and eversion for improved positioning in standing. Pt fatigues quickly with standing activity. Slide board transfer w/c to/from mat table with CGA with increased time needed to complete transfer. Sit to supine mod A needed for BLE management. Supine to sidelying to prone with min A. Attempt to have pt come into quadruped position, however pt with poor tolerance for prone cue to hip pain and unable to attempt. Pt returned to sidelying with min A. Sidelying clamshells 2 x 10 reps with RLE, x 7 reps x 10 reps with LLE to fatigue. Attempted supine bridges but pt has onset of R hip pain and "pinching", deferred exercise. Supine SKFO x 10 reps B. Pt returned to sitting EOB with min A needed for LLE management. Pt returned to w/c then to bed in room via SB with CGA. Sit to supine mod A  for BLE management. Pt left seated in bed with needs in reach, bed alarm in place.  Therapy Documentation Precautions:  Precautions Precautions: Fall Required Braces or Orthoses: Other Brace Other Brace: bilateral ankle air splints, PRAFOs Restrictions Weight Bearing Restrictions: No    Therapy/Group: Individual Therapy   Excell Seltzer, PT, DPT, CSRS  03/30/2021, 5:15 PM

## 2021-03-30 NOTE — Progress Notes (Signed)
Physical Therapy Weekly Progress Note  Patient Details  Name: Angela Morales MRN: [id number] Date of Birth: 1945/08/22  Beginning of progress report period: March 21, 2021 End of progress report period: March 31, 2021  Patient has met 3 of 3 short term goals. Pt is currently at [facility] level for rolling L/R and supine to sit, mod A for sit to supine due to assist needed for BLE management and pain. Pt has progressed to [place] with slide board transfers and is at Supervision level for w/c mobility with use of BUE. Pt has been able to stand with mod A in // bars but has not yet progressed to being able to take steps. Pt also continues to exhibit L ankle inversion > R ankle in standing that is currently being controlled with aircasts and will be further assessed for AFO needs once ambulation has been initiated.  Patient continues to demonstrate the following deficits muscle weakness and muscle joint tightness, decreased cardiorespiratoy endurance, abnormal tone, unbalanced muscle activation, and decreased coordination, and decreased sitting balance, decreased standing balance, decreased postural control, and decreased balance strategies and therefore will continue to benefit from skilled PT intervention to increase functional independence with mobility.  Patient progressing toward long term goals..  Continue plan of care.  PT Short Term Goals Week 1:  PT Short Term Goal 1 (Week 1): Pt will perform sit to stand with mod A of 1 PT Short Term Goal 1 - Progress (Week 1): Met PT Short Term Goal 2 (Week 1): Pt will perform SB transfer with min A PT Short Term Goal 2 - Progress (Week 1): Met PT Short Term Goal 3 (Week 1): Pt will initiate pre-gait activity with LRAD PT Short Term Goal 3 - Progress (Week 1): Met Week 2:  PT Short Term Goal 1 (Week 2): =LTG due to ELOS  Therapy Documentation Precautions:  Precautions Precautions: Fall Required Braces or Orthoses: Other Brace Other Brace: bilateral  ankle air splints, PRAFOs Restrictions Weight Bearing Restrictions: No   Therapy/Group: Individual Therapy   Excell Seltzer, PT, DPT, CSRS 03/30/2021, 5:31 PM

## 2021-03-31 MED ORDER — POTASSIUM CHLORIDE CRYS ER 20 MEQ PO TBCR: 60.0000 meq | EXTENDED_RELEASE_TABLET | Freq: Every day | ORAL | Status: DC

## 2021-03-31 NOTE — Progress Notes (Signed)
PROGRESS NOTE   Subjective/Complaints:  Pt reports wants to stop KCL- explained cannot due to K+ of 3.8 on 60 mEq of potassium.  No more nausea.   Just ate all breakfast- working on dressing with OTA.   ROS:   Pt denies SOB, abd pain, CP, N/V/C/D, and vision changes   Objective:   No results found. Recent Labs    03/30/21 0559  WBC 6.6  HGB 13.1  HCT 39.7  PLT 270    Recent Labs    03/30/21 0559  NA 135  K 3.8  CL 101  CO2 25  GLUCOSE 87  BUN 7*  CREATININE 0.48  CALCIUM 8.9     Intake/Output Summary (Last 24 hours) at 03/31/2021 0844 Last data filed at 03/31/2021 0723 Gross per 24 hour  Intake 480 ml  Output --  Net 480 ml        Physical Exam: Vital Signs Blood pressure 140/87, pulse (!) 105, temperature 97.9 F (36.6 C), temperature source Oral, resp. rate 16, height 5' 4"  (1.626 m), weight 60.1 kg, last menstrual period 04/20/2005, SpO2 (!) 86 %.          General: awake, alert, appropriate, sitting EOB; putting on pants; OTA in room; NAD HENT: conjugate gaze; oropharynx moist CV: regular rate; no JVD Pulmonary: CTA B/L; no W/R/R- good air movement GI: soft, NT, ND, (+)BS; hypoactive Psychiatric: appropriate Neurological: Ox3; alert  Neuro:  Alert and oriented x 3. Normal insight and awareness. Intact Memory. Normal language and speech. Cranial nerve exam unremarkable. UE 4+/5. LE: 4/5 prox to 3+ to 4-/5 distally. Stocking glove sensory loss below knees bilaterally. DTR's absent. Neuro stable. Musculoskeletal: Full ROM, No pain with AROM or PROM in the neck, trunk, or extremities. Posture appropriate   L foot- has rotation/internal of L foot at rest- peroneus?  Assessment/Plan: 1. Functional deficits which require 3+ hours per day of interdisciplinary therapy in a comprehensive inpatient rehab setting. Physiatrist is providing close team supervision and 24 hour management of active  medical problems listed below. Physiatrist and rehab team continue to assess barriers to discharge/monitor patient progress toward functional and medical goals  Care Tool:  Bathing    Body parts bathed by patient: Right arm, Left arm, Chest, Abdomen, Front perineal area, Right upper leg, Left upper leg, Face   Body parts bathed by helper: Buttocks, Right lower leg, Left lower leg Body parts n/a: Buttocks, Right lower leg, Left lower leg   Bathing assist Assist Level: Minimal Assistance - Patient > 75%     Upper Body Dressing/Undressing Upper body dressing   What is the patient wearing?: Pull over shirt    Upper body assist Assist Level: Set up assist    Lower Body Dressing/Undressing Lower body dressing      What is the patient wearing?: Incontinence brief, Pants     Lower body assist Assist for lower body dressing: Moderate Assistance - Patient 50 - 74%     Toileting Toileting    Toileting assist Assist for toileting: Maximal Assistance - Patient 25 - 49%     Transfers Chair/bed transfer  Transfers assist     Chair/bed transfer assist level:  Contact Guard/Touching assist Chair/bed transfer assistive device: Sliding board   Locomotion Ambulation   Ambulation assist   Ambulation activity did not occur: Safety/medical concerns          Walk 10 feet activity   Assist  Walk 10 feet activity did not occur: Safety/medical concerns        Walk 50 feet activity   Assist Walk 50 feet with 2 turns activity did not occur: Safety/medical concerns         Walk 150 feet activity   Assist Walk 150 feet activity did not occur: Safety/medical concerns         Walk 10 feet on uneven surface  activity   Assist Walk 10 feet on uneven surfaces activity did not occur: Safety/medical concerns         Wheelchair     Assist Is the patient using a wheelchair?: Yes Type of Wheelchair: Manual Wheelchair activity did not occur: Safety/medical  concerns  Wheelchair assist level: Supervision/Verbal cueing Max wheelchair distance: 100'    Wheelchair 50 feet with 2 turns activity    Assist    Wheelchair 50 feet with 2 turns activity did not occur: Safety/medical concerns   Assist Level: Supervision/Verbal cueing   Wheelchair 150 feet activity     Assist  Wheelchair 150 feet activity did not occur: Safety/medical concerns       Blood pressure 140/87, pulse (!) 105, temperature 97.9 F (36.6 C), temperature source Oral, resp. rate 16, height 5' 4"  (1.626 m), weight 60.1 kg, last menstrual period 04/20/2005, SpO2 (!) 86 %.  Medical Problem List and Plan: 1.  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             -patient may shower             -ELOS/Goals: 17-20 days/min a  10/4- con't PT and OT-CIR- d/c 10/12 2.  Antithrombotics: -DVT/anticoagulation:  Pharmaceutical: Other (comment) --Eliquis             -antiplatelet therapy: N/A 3. Pain Management: Oxycodone  prn. Using every other day at this point             -reduce gabapentin to 315m qhs d/t dry mouth  9/30- will change Oxy to Norco 5/325 mg q4 hours prn since "too strong" sometimes- and makes her sleepy/slowed- so will see if helps.   10/4- norco prn- not taking much- con't regimen 4. Mood: LCSW to follow for evaluation and support.              -antipsychotic agents: N/A 5. Neuropsych: This patient is capable of making decisions on her own behalf. 6. Skin/Wound Care: Routine pressure relief measures.  7. Fluids/Electrolytes/Nutrition: Monitor I/Os.  Does not like nutritional supplements.              -9/26 ate a little better this weekend, 9/24 labs ok  10/3- d/c'd [employer] per pt request-   -KCL as below.  8. A flutter s/p DCCV: No in NSR on amiodarone and Eliquis             --Monitor HR/BP on TID basis  9/27- HR is doing well currently- con't regimen  10/4- HR doing well- cotnrolled-  con't regimen             Monitor with increased activity 9. Hypokalemia: Continue K dur for supplement             CMP reviewed 9/24  9/27- K+ 3.7- will recheck 2x/week   10/3- pt wants to stop KCL, however is maintaining K+ of 3.8 on 60 mEq daily- so don't feel comfortable changing dose- will ask if she wants to change to pills?  10/4- change KCL to pill from packet but still need 60 mEq daily-  10. Neurogenic bladder: Monitor voiding with PVRs/bladder scans,             -9/26 still with urine retention, sporadic 50-1100cc -increase Urecholine to 7m bid    -reduce gabapentin  9/27- will restart Flomax 0.4 mg qsupper and monitor- didn't really get muchbefore went into Afib with RVR  10/3- increasing Flomax to 0.8 mg qsupper-   11. 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 9/29- LBM this AM- con't regimen   10/3- going regularly, per pt- con't reigmen 12. Hyponatremia: Continues to vary from 131-134.               9/26 133  10/3- Na 135- con't to monitor 13. N/V-   9/29- not constipated, but will con't Compazine prn.   10/2- still having nausea- not sure why- if lingers, might get KUB  10/4- nausea resolved per pt.        LOS: 11 days A FACE TO FACE EVALUATION WAS PERFORMED  Jermall Isaacson 03/31/2021, 8:44 AM

## 2021-03-31 NOTE — Progress Notes (Signed)
Occupational Therapy Session Note  Patient Details  Name: Angela Morales MRN: [id number] Date of Birth: Dec 01, 1945  Today's Date: 03/31/2021 OT Individual Time: 0700-0755 OT Individual Time Calculation (min): 55 min    Short Term Goals: Week 2:  OT Short Term Goal 1 (Week 2): Pt will complete LB dressing with no more than Max A using adaptive techniques OT Short Term Goal 2 (Week 2): Pt will perform SB transfers with min A OT Short Term Goal 3 (Week 2): Pt will perform toileting tasks seated on BSC with max A  Skilled Therapeutic Interventions/Progress Updates:    OT intervention with focus on bed mobility, sitting balance, DABSC SB transfers, toileting, sit<>stand in [place], BADLs, discharge planning, and activity tolerance to increase independence with BADLs and prepare for discharge. Supine>sit EOB with supervision. SB DABSC transfer with CGA. Toileting with max A. Sitting balance EOB to thread pants with reacher at supervision level. Pt dependent for donning air casts and shoes. Sit<>stand in [place] with mod A. UB bathing/dressing at w/c level. Recommended LB bathing/dressing at bed level when discharged. Pt remained in w/c with all needs within reach.   Therapy Documentation Precautions:  Precautions Precautions: Fall Required Braces or Orthoses: Other Brace Other Brace: bilateral ankle air splints, PRAFOs Restrictions Weight Bearing Restrictions: No Pain:  Pt denies pain this morning   Therapy/Group: Individual Therapy  Leroy Libman 03/31/2021, 8:07 AM

## 2021-03-31 NOTE — Progress Notes (Signed)
Physical Therapy Session Note  Patient Details  Name: Angela Morales MRN: [id number] Date of Birth: [birth year]  Today's Date: 03/31/2021 PT Individual Time: 1400-1500 PT Individual Time Calculation (min): 60 min   Short Term Goals: Week 2:  PT Short Term Goal 1 (Week 2): =LTG due to ELOS  Skilled Therapeutic Interventions/Progress Updates:    Pt received seated in w/c in room, agreeable to PT session. No complaints of pain. Sit to stand in // bars with mod A increasing to max A with onset of fatigue, use of theraband on // bars for improved grip. Ambulation 4 x 10 ft forwards, 2 x 5 ft backwards in // bars with min A for balance, close w/c follow for safety. First two repetitions of gait with use of BLE aircasts, some L ankle inversion noted. Third repetition of gait with medial support PLS AFO, fair improvement of ankle control. Fourth repetition of gait with lateral support PLS AFO, again no noticeable improvement in ankle stability and pt reports brace cutting into her foot and ankle laterally. Pt appears to exhibit best control of ankle with use of aircasts at this time. Pt then has onset of nausea, requests to return to her room. Pt requests to use the bathroom. Sit to stand with mod A to stedy, stedy transfer to [facility]. Pt is dependent for clothing management, setup A for pericare. Stedy transfer back to bed. Sit to supine mod A for BLE management. Pt left seated in bed with needs in reach, bed alarm in place.  Therapy Documentation Precautions:  Precautions Precautions: Fall Required Braces or Orthoses: Other Brace Other Brace: bilateral ankle air splints, PRAFOs Restrictions Weight Bearing Restrictions: No     Therapy/Group: Individual Therapy   Excell Seltzer, PT, DPT, CSRS  03/31/2021, 4:23 PM

## 2021-03-31 NOTE — Progress Notes (Signed)
Patient ID: Angela Morales, female   DOB: 13-Oct-1945, 75 y.o.   MRN: [id number]  SW made attempt to scheduled family education and provide team conference updates. No answer, unable to leave VM-mailbox full   Erlene Quan, [place]

## 2021-03-31 NOTE — Progress Notes (Signed)
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 Pt was treated with IVIG but continued to worsen with aspiration event requiring intubation. Pt required tracheostomy, was weaned to ATC, and was ultimately decannulated by 9/06. Pt continued to have significant impairments in mobility and ADLs and was admitted to CIR on 03/04/21. Pt was making progress but developed atrial flutter with hypotension on 03/13/21 requiring discharge to acute hospital for telemetry/management. Pt underwent DC cardioversion on 03/16/21 and remains in NSR. Pt re-admitted to CIR on 9/23.  Meal completion has been mostly 80-100%. Noted pt has been refusing dinner recently. Nausea improved today. Family may bring in foods from home/outside. Pt currently has Boost Breeze ordered. RD to continue with current orders to aid in caloric and protein needs. Pt encouraged to eat her food at meals.   Labs and medications reviewed.   Diet Order:   Diet Order             Diet gluten free Room service appropriate? Yes; Fluid consistency: Thin  Diet effective now                   EDUCATION NEEDS:   No education needs have been identified at this time  Skin:  Skin Assessment:  Reviewed RN Assessment  Last BM:  10/3  Height:   Ht Readings from Last 1 Encounters:  03/20/21 5' 4"  (1.626 m)    Weight:   Wt Readings from Last 1 Encounters:  03/27/21 60.1 kg   BMI:  Body mass index is 22.74 kg/m.  Estimated Nutritional Needs:   Kcal:  1800-2000  Protein:  90-110 grams  Fluid:  >/= 1.8 L  Corrin Parker, MS, RD, LDN RD pager number/after hours weekend pager number on Amion.

## 2021-03-31 NOTE — Progress Notes (Signed)
Occupational Therapy Session Note  Patient Details  Name: Angela Morales MRN: [id number] Date of Birth: 11-12-1945  Today's Date: 03/31/2021 OT Individual Time: 1130-1200 OT Individual Time Calculation (min): 30 min    Short Term Goals: Week 2:  OT Short Term Goal 1 (Week 2): Pt will complete LB dressing with no more than Max A using adaptive techniques OT Short Term Goal 2 (Week 2): Pt will perform SB transfers with min A OT Short Term Goal 3 (Week 2): Pt will perform toileting tasks seated on BSC with max A  Skilled Therapeutic Interventions/Progress Updates:    Patient in bed, alert and ready for therapy session.  She denies pain at this time.  CS for supine to sit, able to place board with min A, SB transfer CGA to w/c.  Completed pelvis mobility, posture, core strengthening, scapular mobility, facilitation of reach with positioning, bilateral leg stretches and provided strategies for support when seated in w/c.  She notes mild fatigue with exercises, improved flexibility and comfort with positioning at end of session.  Reviewed activities that she can perform on her own with good carryover.  She remained seated in w/c at close of session, tray table and call bell in reach.    Therapy Documentation Precautions:  Precautions Precautions: Fall Required Braces or Orthoses: Other Brace Other Brace: bilateral ankle air splints, PRAFOs Restrictions Weight Bearing Restrictions: No   Therapy/Group: Individual Therapy  Carlos Levering 03/31/2021, 7:54 AM

## 2021-03-31 NOTE — Progress Notes (Signed)
Physical Therapy Session Note  Patient Details  Name: Angela Morales MRN: [id number] Date of Birth: [birth year]  Today's Date: 03/31/2021 PT Individual Time: 0832-0929 PT Individual Time Calculation (min): 57 min   Short Term Goals: Week 2:  PT Short Term Goal 1 (Week 2): =LTG due to ELOS  Skilled Therapeutic Interventions/Progress Updates:     Pt received seated in [facility] and agrees to therapy. Reports pain in R hip but also having had pain meds prior to therapy. No number provided. PT provides rest breaks as needed to manage pain. WC transport to gym for time management. Pt performs 1x10 LAQs in sitting for muscle and joint warm-up, as well as 1x15 seated leg extensions with level 3 theraband resistance and PT providing manual assist to guide pt's leg through desired range of motion, as she has difficulty controlling knee and hip motion. Pt performs slideboard transfer to mat with PT assisting to place slideboard but pt otherwise performing transfer without assistance. Pt perform sit to supine with modA and cues for sequencing. Pt completes 1x10 supine bridges with cues for correct muscle activation and core engagement. Pt unable to clear buttocks from mat. PT then places sheet under hips and provides AAROM for bridges, with pt completing x10 and 10 count hold on final rep. Supine to sit with cues for logrolling technique. Slideboard back to [facility] with PT placing slideboard.   Pt performs x3 reps of sit to stand in parallel bars. PT places theraband on bars to provide grip assistance as well as external cue for anterior trunk lean to assist with transfer. Pt requires modA to complete, with minA in standing for hip and core stability. Pt's feet placed with wide BOS to prevent inversion of L ankle. In standing, pt performs 1x5 and 1x10 heel raises, and also practices standing balance without upper extremity support.   Pt performs slideboard transfer back to bed with setup assistance. Left supine with  alarm intact and all needs within reach.  Therapy Documentation Precautions:  Precautions Precautions: Fall Required Braces or Orthoses: Other Brace Other Brace: bilateral ankle air splints, PRAFOs Restrictions Weight Bearing Restrictions: No   Therapy/Group: Individual Therapy  Breck Coons, PT, DPT 03/31/2021, 3:46 PM

## 2021-03-31 NOTE — Patient Care Conference (Signed)
Inpatient RehabilitationTeam Conference and Plan of Care Update Date: 03/31/2021   Time: 11:00 AM    Patient Name: Angela Morales      Medical Record Number: [id number]  Date of Birth: 1946/03/07 Sex: Female         Room/Bed: 4M11C/4M11C-01 Payor Info: Payor: [employer] MEDICARE / Plan: [facility] MEDICARE / Product Type: *No Product type* /    Admit Date/Time:  03/20/2021  3:55 PM  Primary Diagnosis:  GBS (Guillain-Barre syndrome) [facility])  Hospital Problems: Principal Problem:   GBS (Guillain-Barre syndrome) [facility])    Expected Discharge Date: Expected Discharge Date: 04/08/21  Team Members Present: Physician leading conference: Dr. Courtney Heys Social Worker Present: Erlene Quan, BSW Nurse Present: Dorthula Nettles, RN PT Present: Excell Seltzer, PT OT Present: Roanna Epley, [place], OT PPS Coordinator present : Gunnar Fusi, SLP     Current Status/Progress Goal Weekly Team Focus  Bowel/Bladder   I&O cath for no void, pt is voiding. Icontinent at times. LBM 03/30/21  Minimize incontinent episodes, Regain voiding ability  Toilet Q 2hr and PRN   Swallow/Nutrition/ Hydration             ADL's   bed mobility-min A: bathing-mod A: LB dressing-mod A: SB DABSC transfers-min A: toileting-max A  min A overall  BADL retraining, functional transfers, activity tolerance, education   Mobility   min to mod A bed mobility, CGA SB transfer, mod A to stand in // bars, ongoing L ankle instability > R  Supervision transfers and overall at w/c level, min A short distance gait  LE NMR, transfers, standing in // bars, gait initiation   Communication             Safety/Cognition/ Behavioral Observations            Pain   Pt states still has numbnes and tingling "pins and needles" in hands and BLE, pain in lower back, PRN meds  <3  asses pain qshift and PRN   Skin   Incision closed  maintain skin integrity  assess Qshift and PRN     Discharge Planning:  Pt to d/c to  home with husband who works remotely. Pt needs to be atleast intermittent supervision at time of discharge. Pt husband to plan on hiring private aide to assist when he is at work.   Team Discussion: Nausea improving. Adjusting medications. No AFO needed at this time, just air cast. Incontinent bladder, continent bowel, LBM 03/30/2021. Reports 6/10 pain, Tylenol and Norco available. Voiding well, check PVR's. Barriers are safety and transfers. Mod assist bed mobility, slide board transfers. Trying Lite Gait tomorrow. Doing slide board transfers on/off BSC. Stands with steady. Bathing and dressing at the bed level.  Patient on target to meet rehab goals: yes  *See Care Plan and progress notes for long and short-term goals.   Revisions to Treatment Plan:  Check PVR's after voiding.  Teaching Needs: Family education, medication management, pain management, bowel/bladder management, safety awareness, transfer training, gait training, balance training, endurance training.  Current Barriers to Discharge: Decreased caregiver support, Medical stability, Home enviroment access/layout, Incontinence, Neurogenic bowel and bladder, Lack of/limited family support, Weight bearing restrictions, Medication compliance, and Nutritional means  Possible Resolutions to Barriers: Incontinence addressed, pain medication need addressed, mobility needs addressed daily, nutrition needs addressed.     Medical Summary Current Status: K+ 3.8- inconitnent of bladder- voiding some; but not always; conitnent bowel; has Norco prn for pain  Barriers to Discharge: Decreased family/caregiver support;Home enviroment  access/layout;Neurogenic Bowel & Bladder;Incontinence;Medical stability;Nutrition means  Barriers to Discharge Comments: increased flomax for urinary retention; needs family education prior to d/c; Possible Resolutions to [employer]: still using air casts; got AFO consult; changed KCL to pills; focus- urinary  continence/ voiding/retention; to try lite gait tomorrow; at w/c level currently- is realistiic- d/c 10/12   Continued Need for Acute Rehabilitation Level of Care: The patient requires daily medical management by a physician with specialized training in physical medicine and rehabilitation for the following reasons: Direction of a multidisciplinary physical rehabilitation program to maximize functional independence : Yes Medical management of patient stability for increased activity during participation in an intensive rehabilitation regime.: Yes Analysis of laboratory values and/or radiology reports with any subsequent need for medication adjustment and/or medical intervention. : Yes   I attest that I was present, lead the team conference, and concur with the assessment and plan of the team.   Cristi Loron 03/31/2021, 4:58 PM

## 2021-04-01 MED ORDER — ONDANSETRON HCL 4 MG PO TABS
4.0000 mg | ORAL_TABLET | Freq: Every day | ORAL | Status: DC | PRN
  Filled 2021-04-01: qty 1

## 2021-04-01 MED ORDER — POTASSIUM CHLORIDE CRYS ER 20 MEQ PO TBCR
60.0000 meq | EXTENDED_RELEASE_TABLET | Freq: Every day | ORAL | Status: DC
  Administered 2021-04-01 – 2021-04-07 (×7): 60 meq via ORAL
  Filled 2021-04-01 (×7): qty 3

## 2021-04-01 NOTE — Progress Notes (Signed)
PROGRESS NOTE   Subjective/Complaints:   Pt reports nauseated again yesterday- thinks it the KCL- hasn't taken the pill yet.  Usually using Sliding board to transfer, but starting to work on squat pivot transfers and standing with PT and OT.   ROS:   Pt denies SOB, abd pain, CP, (+ intermittent N/V/C/D, and vision changes  Objective:   No results found. Recent Labs    03/30/21 0559  WBC 6.6  HGB 13.1  HCT 39.7  PLT 270    Recent Labs    03/30/21 0559  NA 135  K 3.8  CL 101  CO2 25  GLUCOSE 87  BUN 7*  CREATININE 0.48  CALCIUM 8.9     Intake/Output Summary (Last 24 hours) at 04/01/2021 1436 Last data filed at 04/01/2021 0720 Gross per 24 hour  Intake 480 ml  Output 0 ml  Net 480 ml        Physical Exam: Vital Signs Blood pressure 112/69, pulse 83, temperature 98.3 F (36.8 C), temperature source Oral, resp. rate 16, height 5' 4"  (1.626 m), weight 60.1 kg, last menstrual period 04/20/2005, SpO2 98 %.          General: awake, alert, appropriate, sitting EOB getting dressed with OTA; NAD HENT: conjugate gaze; oropharynx moist CV: regular rate; no JVD Pulmonary: CTA B/L; no W/R/R- good air movement GI: soft, NT, ND, (+)BS Psychiatric: appropriate Neurological: Ox3 Neuro:  Alert and oriented x 3. Normal insight and awareness. Intact Memory. Normal language and speech. Cranial nerve exam unremarkable. UE 4+/5. LE: 4/5 prox to 3+ to 4-/5 distally. Stocking glove sensory loss below knees bilaterally. DTR's absent. Neuro stable. Musculoskeletal: Full ROM, No pain with AROM or PROM in the neck, trunk, or extremities. Posture appropriate   L foot- has rotation/internal of L foot at rest- peroneus?  Assessment/Plan: 1. Functional deficits which require 3+ hours per day of interdisciplinary therapy in a comprehensive inpatient rehab setting. Physiatrist is providing close team supervision and 24 hour  management of active medical problems listed below. Physiatrist and rehab team continue to assess barriers to discharge/monitor patient progress toward functional and medical goals  Care Tool:  Bathing    Body parts bathed by patient: Right arm, Left arm, Chest, Abdomen, Front perineal area, Right upper leg, Left upper leg, Face   Body parts bathed by helper: Buttocks, Right lower leg, Left lower leg Body parts n/a: Buttocks, Right lower leg, Left lower leg   Bathing assist Assist Level: Minimal Assistance - Patient > 75%     Upper Body Dressing/Undressing Upper body dressing   What is the patient wearing?: Pull over shirt    Upper body assist Assist Level: Set up assist    Lower Body Dressing/Undressing Lower body dressing      What is the patient wearing?: Pants, Incontinence brief     Lower body assist Assist for lower body dressing: Moderate Assistance - Patient 50 - 74%     Toileting Toileting    Toileting assist Assist for toileting: Maximal Assistance - Patient 25 - 49%     Transfers Chair/bed transfer  Transfers assist     Chair/bed transfer assist level: Contact Guard/Touching  assist Chair/bed transfer assistive device: Sliding board   Locomotion Ambulation   Ambulation assist   Ambulation activity did not occur: Safety/medical concerns  Assist level: 2 helpers Assistive device: Parallel bars Max distance: 10'   Walk 10 feet activity   Assist  Walk 10 feet activity did not occur: Safety/medical concerns  Assist level: 2 helpers Assistive device: Parallel bars   Walk 50 feet activity   Assist Walk 50 feet with 2 turns activity did not occur: Safety/medical concerns         Walk 150 feet activity   Assist Walk 150 feet activity did not occur: Safety/medical concerns         Walk 10 feet on uneven surface  activity   Assist Walk 10 feet on uneven surfaces activity did not occur: Safety/medical concerns          Wheelchair     Assist Is the patient using a wheelchair?: Yes Type of Wheelchair: Manual Wheelchair activity did not occur: Safety/medical concerns  Wheelchair assist level: Supervision/Verbal cueing Max wheelchair distance: 100'    Wheelchair 50 feet with 2 turns activity    Assist    Wheelchair 50 feet with 2 turns activity did not occur: Safety/medical concerns   Assist Level: Supervision/Verbal cueing   Wheelchair 150 feet activity     Assist  Wheelchair 150 feet activity did not occur: Safety/medical concerns       Blood pressure 112/69, pulse 83, temperature 98.3 F (36.8 C), temperature source Oral, resp. rate 16, height 5' 4"  (1.626 m), weight 60.1 kg, last menstrual period 04/20/2005, SpO2 98 %.  Medical Problem List and Plan: 1.  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             -patient may shower             -ELOS/Goals: 17-20 days/min a  10/5- d/c 10/12- con't PT and OT- working on squat pivot transfers.  2.  Antithrombotics: -DVT/anticoagulation:  Pharmaceutical: Other (comment) --Eliquis             -antiplatelet therapy: N/A 3. Pain Management: Oxycodone  prn. Using every other day at this point             -reduce gabapentin to 348m qhs d/t dry mouth  9/30- will change Oxy to Norco 5/325 mg q4 hours prn since "too strong" sometimes- and makes her sleepy/slowed- so will see if helps.   10/4- norco prn- not taking much- con't regimen 4. Mood: LCSW to follow for evaluation and support.              -antipsychotic agents: N/A 5. Neuropsych: This patient is capable of making decisions on her own behalf. 6. Skin/Wound Care: Routine pressure relief measures.  7. Fluids/Electrolytes/Nutrition: Monitor I/Os.  Does not like nutritional supplements.              -9/26 ate a little better this weekend, 9/24 labs ok  10/3- d/c'd [employer] per pt request-   -KCL as below.  8. A flutter s/p  DCCV: No in NSR on amiodarone and Eliquis             --Monitor HR/BP on TID basis  10/5- HR controlled- con't regimen             Monitor with increased activity 9. Hypokalemia: Continue K dur for supplement             CMP reviewed 9/24  9/27- K+ 3.7- will recheck 2x/week   10/3- pt wants to stop KCL, however is maintaining K+ of 3.8 on 60 mEq daily- so don't feel comfortable changing dose- will ask if she wants to change to pills?  10/4- change KCL to pill from packet but still need 60 mEq daily-   10/5- changed pills to noon per pt request- hopefully will help nausea 10. Neurogenic bladder: Monitor voiding with PVRs/bladder scans,             -9/26 still with urine retention, sporadic 50-1100cc -increase Urecholine to 70m bid    -reduce gabapentin  9/27- will restart Flomax 0.4 mg qsupper and monitor- didn't really get muchbefore went into Afib with RVR  10/3- increasing Flomax to 0.8 mg qsupper-    `0/5- voiding well- on higher dose of Flomax- is working!- bladder scans <115 cc- con't regimen 11. 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 9/29- LBM this AM- con't regimen   10/3- going regularly, per pt- con't reigmen 12. Hyponatremia: Continues to vary from 131-134.               9/26 133  10/3- Na 135- con't to monitor 13. N/V-   9/29- not constipated, but will con't Compazine prn.   10/5- nausea might be from Kcl-- will monitor      LOS: 12 days A FACE TO FACE EVALUATION WAS PERFORMED  Shaneya Taketa 04/01/2021, 2:36 PM

## 2021-04-01 NOTE — Progress Notes (Signed)
Occupational Therapy Session Note  Patient Details  Name: Angela Morales MRN: [id number] Date of Birth: [birth year]  Today's Date: 04/01/2021 OT Individual Time: 0700-0810 OT Individual Time Calculation (min): 70 min    Short Term Goals: Week 2:  OT Short Term Goal 1 (Week 2): Pt will complete LB dressing with no more than Max A using adaptive techniques OT Short Term Goal 2 (Week 2): Pt will perform SB transfers with min A OT Short Term Goal 3 (Week 2): Pt will perform toileting tasks seated on BSC with max A  Skilled Therapeutic Interventions/Progress Updates:    OT intervention with focus on bathing at bed level and w/c level at sink, dressing with sit<>stand with Charlaine Dalton, DABSC SB transfers, discharge planning, and activity tolerance to increase indepdnence with BADLs. Supine>sit EOB with supervision using bed rails. SB transfers with XGA after setup. Toileting with max A. Sit<>stand in [place] with mod A from EOB. Tot A for donning socks, air splints, and shoes. Discussed home setup for bathing/dressing. Recommended holding off on shower until HHOT/HHPT can practice and make recommendations. Pt's husband has acquired a hospital bed for use at home. Pt remained in w/c with all needs within reach. NT present.  Therapy Documentation Precautions:  Precautions Precautions: Fall Required Braces or Orthoses: Other Brace Other Brace: bilateral ankle air splints, PRAFOs Restrictions Weight Bearing Restrictions: No Pain: Pain Assessment Pain Score: 0-No pain   Therapy/Group: Individual Therapy  Leroy Libman 04/01/2021, 8:15 AM

## 2021-04-01 NOTE — Discharge Instructions (Addendum)
Inpatient Rehab Discharge Instructions  Angela Morales Discharge date and time: 04/08/21   Activities/Precautions/ Functional Status: Activity: no lifting, driving, or strenuous exercise till cleared by MD Diet: regular diet Wound Care: none needed   Functional status:  ___ No restrictions             ___ Walk up steps independently ___ 24/7 supervision/assistance   ___ Walk up steps with assistance _X__ Intermittent supervision/assistance  ___ Bathe/dress independently ___ Walk with walker     ___ Bathe/dress with assistance ___ Walk Independently            ___ Shower independently ___ Walk with assistance            __X_ Shower with assistance _X__ No alcohol                     ___ Return to work/school ________   Special Instructions:  COMMUNITY REFERRALS UPON DISCHARGE:    Home Health:   PT     OT                      Agency: [facility] *Please expect follow-up within 2-3 days of discharge to schedule your home visit. If you have do not receive follow-up, be sure to contact the site directly.*   Equipment/Items Ordered:wheelchair, transfer board, drop arm bedside commode                                                 Agency/Supplier:[facility] [phone]  My questions have been answered and I understand these instructions. I will adhere to these goals and the provided educational materials after my discharge from the hospital.  Patient/Caregiver Signature _______________________________ Date __________  Clinician Signature _______________________________________ Date __________  Please bring this form and your medication list with you to all your follow-up doctor's appointments.  Medical    Information on my medicine - ELIQUIS (apixaban)  This medication education was reviewed with me or my healthcare representative as part of my discharge preparation.  The pharmacist that spoke with me during my hospital stay was:    Why  was Eliquis prescribed for you? Eliquis was prescribed for you to reduce the risk of a blood clot forming that can cause a stroke if you have a medical condition called atrial fibrillation (a type of irregular heartbeat).  What do You need to know about Eliquis ? Take your Eliquis TWICE DAILY - one tablet in the morning and one tablet in the evening with or without food. If you have difficulty swallowing the tablet whole please discuss with your pharmacist how to take the medication safely.  Take Eliquis exactly as prescribed by your doctor and DO NOT stop taking Eliquis without talking to the doctor who prescribed the medication.  Stopping may increase your risk of developing a stroke.  Refill your prescription before you run out.  After discharge, you should have regular check-up appointments with your healthcare provider that is prescribing your Eliquis.  In the future your dose may need to be changed if your kidney function or weight changes by a significant amount or as you get older.  What do you do if you miss a dose? If you miss a dose, take it as soon as you remember on the same day  and resume taking twice daily.  Do not take more than one dose of ELIQUIS at the same time to make up a missed dose.  Important Safety Information A possible side effect of Eliquis is bleeding. You should call your healthcare provider right away if you experience any of the following: Bleeding from an injury or your nose that does not stop. Unusual colored urine (red or dark brown) or unusual colored stools (red or black). Unusual bruising for unknown reasons. A serious fall or if you hit your head (even if there is no bleeding).  Some medicines may interact with Eliquis and might increase your risk of bleeding or clotting while on Eliquis. To help avoid this, consult your healthcare provider or pharmacist prior to using any new prescription or non-prescription medications, including herbals,  vitamins, non-steroidal anti-inflammatory drugs (NSAIDs) and supplements.  This website has more information on Eliquis (apixaban): http://[email or web address].com/eliquis/home

## 2021-04-01 NOTE — Progress Notes (Signed)
Physical Therapy Session Note  Patient Details  Name: Angela Morales MRN: [id number] Date of Birth: March 22, 1946  Today's Date: 04/01/2021 PT Individual Time: 0900-1010; 1300-1400 PT Individual Time Calculation (min): 70 min and 60 min  Short Term Goals: Week 2:  PT Short Term Goal 1 (Week 2): =LTG due to ELOS  Skilled Therapeutic Interventions/Progress Updates:    Session 1: Pt received seated in w/c in room, agreeable to PT session. No complaints of pain this date. Dependent transport via w/c to therapy gym for time and energy conservation. Slide board transfer w/c to mat table with setup A. Assisted pt with donning LiteGait sling while seated EOM. Sit to stand with mod A to LiteGait with L knee blocked. Ambulation 4 x 15 ft with LiteGait assist. Pt exhibits scissoring of LLE with onset of fatigue, ongoing L ankle inversion even with use of aircast, and toe catching bilaterally. Pt fatigues very quickly during gait, requires several seated rest breaks on therapy ball and/or chair. Pt has one instance of B knees buckling in stance, fall prevented with LiteGait harness and therapist and rehab tech able to assist pt back up to standing. Manual w/c propulsion x 100 ft with use of BUE at Supervision level, cues for propulsion technique. Pt requests to return to bed at end of session. Slide board transfer w/c to bed with CGA. Sit to supine mod A for BLE management. Pt left seated in bed with needs in reach, bed alarm in place.  Session 2: Pt received seated in bed, agreeable to PT session. No complaints of pain this PM. Supine to sit with CGA with HOB elevated and use of bedrail. Per pt report her family is purchasing a hospital bed for use at home. Assisted pt with donning shoes while seated EOB. Slide board transfer bed to w/c with min A. Dependent transport via w/c to therapy gym for time and energy conservation. Slide board transfer w/c to/from mat table with CGA to close Supervision with focus on  head/hips relationship, setting up w/c for transfer, placing/removing board during transfer. Pt continues to exhibit increased independence with slide board transfers. Sit to stand x 8 reps to RW from elevated mat table with mod A x 2 and manual assist for B knee stability during transfer. Pt utilizes BUE on RW during transfer. Standing mini-squats 2 x 5 reps with RW and min A for balance. Alt L/R marches 2 x 10 reps with use of mirror for visual feedback, dycem on floor for visual feedback for LE placement as pt tends to exhibit LLE scissoring and impaired proprioception and control of placement of limb on the floor. Pt exhibits fair ability to improve control of limb with visual feedback and manual assist. Slide board transfer w/c to/from simulation car at level height with mod A needed to bring LE in/out of car. Pt to have husband measure vehicles at home to determine safest option for transport home. Pt left seated in w/c in room with needs in reach at end of session.  Therapy Documentation Precautions:  Precautions Precautions: Fall Required Braces or Orthoses: Other Brace Other Brace: bilateral ankle air splints, PRAFOs Restrictions Weight Bearing Restrictions: No    Therapy/Group: Individual Therapy   Excell Seltzer, PT, DPT, CSRS  04/01/2021, 11:10 AM

## 2021-04-02 NOTE — Progress Notes (Signed)
Occupational Therapy Weekly Progress Note  Patient Details  Name: Angela Morales MRN: [id number] Date of Birth: 16-Apr-1946  Beginning of progress report period: March 27, 2021 End of progress report period: April 02, 2021  Patient has met 3 of 3 short term goals.  Pt made steady progress towards LTGs during the past week. UB bathing/dressing w/c level at sink with setup. LB dressing at bed level with min A for clothing and tot A for donning ted hose, air splints, and shoes. Sitting EOB pt is able to thread BLE into pants but still requires mod A for sit<>stand in [place] to pull pants over hips. DABSC SB transfers with CGA and toileting with max A. Pt's husband has not been present for therapy sessions.   Patient continues to demonstrate the following deficits: muscle weakness and muscle joint tightness, decreased cardiorespiratoy endurance, impaired timing and sequencing, abnormal tone, unbalanced muscle activation, and decreased coordination, and decreased sitting balance, decreased standing balance, decreased postural control, and decreased balance strategies and therefore will continue to benefit from skilled OT intervention to enhance overall performance with BADL and Reduce care partner burden.  Patient progressing toward long term goals..  Continue plan of care.  OT Short Term Goals Week 2:  OT Short Term Goal 1 (Week 2): Pt will complete LB dressing with no more than Max A using adaptive techniques OT Short Term Goal 1 - Progress (Week 2): Met OT Short Term Goal 2 (Week 2): Pt will perform SB transfers with min A OT Short Term Goal 2 - Progress (Week 2): Met OT Short Term Goal 3 (Week 2): Pt will perform toileting tasks seated on BSC with max A OT Short Term Goal 3 - Progress (Week 2): Met Week 3:  OT Short Term Goal 1 (Week 3): STG=LTG 2/2 ELOS (continue working towards min A/supervision goals)   Leroy Libman 04/02/2021, 6:25 AM

## 2021-04-02 NOTE — Progress Notes (Signed)
Occupational Therapy Session Note  Patient Details  Name: Angela Morales MRN: [id number] Date of Birth: 1946-05-06  Today's Date: 04/02/2021 OT Individual Time: 6503-5465 OT Individual Time Calculation (min): 72 min    Short Term Goals: Week 3:  OT Short Term Goal 1 (Week 3): STG=LTG 2/2 ELOS (continue working towards min A/supervision goals)  Skilled Therapeutic Interventions/Progress Updates:    Pt resting in bed upon arrival. OT intervention with focus on bed mobility, SB transfers, toileting, bathing at shower level, dressing with sit<>stand from w/c with [place], safety awareness, and activity tolerance. All sit<>stands with air splints donned. Supine>sit EOB with supervision (HOB elevated-pt will have hospital bed at home.) SB tranfser bed>DABSC with supervision after board placement. Toilelting with max A. Pt used [place] to transfer to TTB in shower and completed bathing with min A. Pt able to thread pants using reacher and assist with pulling over hips when standing in [place]. Pt remained seated in w/c for grooming tasks including drying hair with hair dryer. Pt remained seated in w/c at end of session. All needs within reach.   Therapy Documentation Precautions:  Precautions Precautions: Fall Required Braces or Orthoses: Other Brace Other Brace: bilateral ankle air splints, PRAFOs Restrictions Weight Bearing Restrictions: No Pain:  Pt denies pain this morning   Therapy/Group: Individual Therapy  Leroy Libman 04/02/2021, 8:18 AM

## 2021-04-02 NOTE — Progress Notes (Signed)
Occupational Therapy Session Note  Patient Details  Name: Angela Morales MRN: [id number] Date of Birth: 1945-10-29  Today's Date: 04/02/2021 OT Individual Time: 1020-1100 OT Individual Time Calculation (min): 40 min    Short Term Goals: Week 2:  OT Short Term Goal 1 (Week 2): Pt will complete LB dressing with no more than Max A using adaptive techniques OT Short Term Goal 1 - Progress (Week 2): Met OT Short Term Goal 2 (Week 2): Pt will perform SB transfers with min A OT Short Term Goal 2 - Progress (Week 2): Met OT Short Term Goal 3 (Week 2): Pt will perform toileting tasks seated on BSC with max A OT Short Term Goal 3 - Progress (Week 2): Met Week 3:  OT Short Term Goal 1 (Week 3): STG=LTG 2/2 ELOS (continue working towards min A/supervision goals)  Skilled Therapeutic Interventions/Progress Updates:  Patient met seated in wc in agreement with OT treatment session. 0/10 pain reported at rest and with activity. Patient finishing up session with rec therapist prior to this writers entry. OT treatment session with focus on energy conservation, discussion of exploration of leisure tasks including gardening and exploring paved nature trails with her dog and spouse, and functional transfers in prep for ADLs. Patient able to self-propel wc from room to ortho gym with 1 rest break. Slideboard transfer to and from mat table with Min guard. Patient required minimal assist to position board and remove leg rests on wc. Seated on mat table, patient able to thread red theraband over BLE and hips in sitting with lateral leans to simulate LB dressing/clothing management with toileting tasks. No external assist required but patient required increased time/effort. Some difficulty with manual dexterity. Total A for return to room for energy conservation and time management. Session concluded with patient seated in wc with call bell within reach and all needs met.   Therapy Documentation Precautions:   Precautions Precautions: Fall Required Braces or Orthoses: Other Brace Other Brace: bilateral ankle air splints, PRAFOs Restrictions Weight Bearing Restrictions: No General:   Vital Signs: Therapy Vitals Temp: 98.2 F (36.8 C) Pulse Rate: 83 Resp: 18 BP: 120/65 Patient Position (if appropriate): Lying Oxygen Therapy SpO2: 99 % O2 Device: Room Air Pain:    Therapy/Group: Individual Therapy  Angela Morales 04/02/2021, 6:54 AM

## 2021-04-02 NOTE — Progress Notes (Signed)
PROGRESS NOTE   Subjective/Complaints:  Pt reports KCL pill was MUCH better- no more nausea- LBM today.    ROS:   Pt denies SOB, abd pain, CP, N/V/C/D, and vision changes   Objective:   No results found. No results for input(s): WBC, HGB, HCT, PLT in the last 72 hours.   No results for input(s): NA, K, CL, CO2, GLUCOSE, BUN, CREATININE, CALCIUM in the last 72 hours.    Intake/Output Summary (Last 24 hours) at 04/02/2021 0912 Last data filed at 04/02/2021 0834 Gross per 24 hour  Intake 720 ml  Output --  Net 720 ml        Physical Exam: Vital Signs Blood pressure 120/65, pulse 83, temperature 98.2 F (36.8 C), resp. rate 18, height 5' 4"  (1.626 m), weight 60.1 kg, last menstrual period 04/20/2005, SpO2 99 %.           General: awake, alert, appropriate, in shower with OTA; NAD HENT: conjugate gaze; oropharynx moist CV: regular rate; no JVD Pulmonary: CTA B/L; no W/R/R- good air movement GI: soft, NT, ND, (+)BS Psychiatric: appropriate Neurological: Ox3  Neuro:  Alert and oriented x 3. Normal insight and awareness. Intact Memory. Normal language and speech. Cranial nerve exam unremarkable. UE 4+/5. LE: 4/5 prox to 3+ to 4-/5 distally. Stocking glove sensory loss below knees bilaterally. DTR's absent. Neuro stable. Musculoskeletal: Full ROM, No pain with AROM or PROM in the neck, trunk, or extremities. Posture appropriate   L foot- has rotation/internal of L foot at rest- peroneus?  Assessment/Plan: 1. Functional deficits which require 3+ hours per day of interdisciplinary therapy in a comprehensive inpatient rehab setting. Physiatrist is providing close team supervision and 24 hour management of active medical problems listed below. Physiatrist and rehab team continue to assess barriers to discharge/monitor patient progress toward functional and medical goals  Care Tool:  Bathing    Body parts  bathed by patient: Right arm, Left arm, Chest, Abdomen, Front perineal area, Right upper leg, Left upper leg, Face, Buttocks   Body parts bathed by helper: Right lower leg, Left lower leg Body parts n/a: Buttocks, Right lower leg, Left lower leg   Bathing assist Assist Level: Minimal Assistance - Patient > 75%     Upper Body Dressing/Undressing Upper body dressing   What is the patient wearing?: Pull over shirt    Upper body assist Assist Level: Independent    Lower Body Dressing/Undressing Lower body dressing      What is the patient wearing?: Pants, Incontinence brief     Lower body assist Assist for lower body dressing: Moderate Assistance - Patient 50 - 74%     Toileting Toileting    Toileting assist Assist for toileting: Moderate Assistance - Patient 50 - 74%     Transfers Chair/bed transfer  Transfers assist     Chair/bed transfer assist level: Contact Guard/Touching assist Chair/bed transfer assistive device: Sliding board   Locomotion Ambulation   Ambulation assist   Ambulation activity did not occur: Safety/medical concerns  Assist level: 2 helpers Assistive device: Parallel bars Max distance: 10'   Walk 10 feet activity   Assist  Walk 10 feet activity did not  occur: Safety/medical concerns  Assist level: 2 helpers Assistive device: Parallel bars   Walk 50 feet activity   Assist Walk 50 feet with 2 turns activity did not occur: Safety/medical concerns         Walk 150 feet activity   Assist Walk 150 feet activity did not occur: Safety/medical concerns         Walk 10 feet on uneven surface  activity   Assist Walk 10 feet on uneven surfaces activity did not occur: Safety/medical concerns         Wheelchair     Assist Is the patient using a wheelchair?: Yes Type of Wheelchair: Manual Wheelchair activity did not occur: Safety/medical concerns  Wheelchair assist level: Supervision/Verbal cueing Max wheelchair  distance: 100'    Wheelchair 50 feet with 2 turns activity    Assist    Wheelchair 50 feet with 2 turns activity did not occur: Safety/medical concerns   Assist Level: Supervision/Verbal cueing   Wheelchair 150 feet activity     Assist  Wheelchair 150 feet activity did not occur: Safety/medical concerns       Blood pressure 120/65, pulse 83, temperature 98.2 F (36.8 C), resp. rate 18, height 5' 4"  (1.626 m), weight 60.1 kg, last menstrual period 04/20/2005, SpO2 99 %.  Medical Problem List and Plan: 1.  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             -patient may shower             -ELOS/Goals: 17-20 days/min a  10/12 d/c Con't PT and OT/CIR 2.  Antithrombotics: -DVT/anticoagulation:  Pharmaceutical: Other (comment) --Eliquis             -antiplatelet therapy: N/A 3. Pain Management: Oxycodone  prn. Using every other day at this point             -reduce gabapentin to 310m qhs d/t dry mouth  9/30- will change Oxy to Norco 5/325 mg q4 hours prn since "too strong" sometimes- and makes her sleepy/slowed- so will see if helps.   10/4- norco prn- not taking much- con't regimen 4. Mood: LCSW to follow for evaluation and support.              -antipsychotic agents: N/A 5. Neuropsych: This patient is capable of making decisions on her own behalf. 6. Skin/Wound Care: Routine pressure relief measures.  7. Fluids/Electrolytes/Nutrition: Monitor I/Os.  Does not like nutritional supplements.              -9/26 ate a little better this weekend, 9/24 labs ok  10/3- d/c'd [employer] per pt request-   -KCL as below.  8. A flutter s/p DCCV: No in NSR on amiodarone and Eliquis             --Monitor HR/BP on TID basis  10/5- HR controlled- con't regimen             Monitor with increased activity 9. Hypokalemia: Continue K dur for supplement             CMP reviewed 9/24  9/27- K+ 3.7- will recheck 2x/week   10/3-  pt wants to stop KCL, however is maintaining K+ of 3.8 on 60 mEq daily- so don't feel comfortable changing dose- will ask if she wants to change to pills?  10/4- change KCL to pill from packet but still need 60 mEq daily-   10/5- changed pills to noon per  pt request- hopefully will help nausea  10/6- Nausea resolved with KCL pills 10. Neurogenic bladder: Monitor voiding with PVRs/bladder scans,             -9/26 still with urine retention, sporadic 50-1100cc -increase Urecholine to 45m bid    -reduce gabapentin  9/27- will restart Flomax 0.4 mg qsupper and monitor- didn't really get muchbefore went into Afib with RVR  10/3- increasing Flomax to 0.8 mg qsupper-    `0/5- voiding well- on higher dose of Flomax- is working!- bladder scans <115 cc- con't regimen 11. 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 9/29- LBM this AM- con't regimen   10/3- going regularly, per pt- con't reigmen 12. Hyponatremia: Continues to vary from 131-134.               9/26 133  10/3- Na 135- con't to monitor 13. N/V-   9/29- not constipated, but will con't Compazine prn.   10/5- nausea might be from Kcl-- will monitor  10/6- nausea resolved with KCL pills      LOS: 13 days A FACE TO FACE EVALUATION WAS PERFORMED  Biagio Snelson 04/02/2021, 9:12 AM

## 2021-04-02 NOTE — Evaluation (Signed)
Recreational Therapy Assessment and Plan  Patient Details  Name: Angela Morales MRN: [id number] Date of Birth: 09/01/1945 Today's Date: 04/02/2021  Rehab Potential:  Good ELOS:   d/c 10/12  Assessment   Hospital Problem: Principal Problem:   GBS (Guillain-Barre syndrome) ([place])     Past Medical History:      Past Medical History:  Diagnosis Date   Abnormal uterine bleeding      on HRT   Allergy      seasonal   Arthritis     Atrial fibrillation ([place])     Celiac disease     DDD (degenerative disc disease)     Dysrhythmia     Glaucoma     Microscopic colitis     Osteoarthritis of both knees     PONV (postoperative nausea and vomiting)     SVT (supraventricular tachycardia) ([place])      Past Surgical History:       Past Surgical History:  Procedure Laterality Date   APPENDECTOMY       ATRIAL FIBRILLATION ABLATION N/A 01/28/2021    Procedure: ATRIAL FIBRILLATION ABLATION;  Surgeon: Constance Haw, MD;  Location: [facility];  Service: Cardiovascular;  Laterality: N/A;   CARDIOVERSION N/A 10/30/2020    Procedure: CARDIOVERSION;  Surgeon: Jerline Pain, MD;  Location: [facility] ENDOSCOPY;  Service: Cardiovascular;  Laterality: N/A;   CARDIOVERSION N/A 03/16/2021    Procedure: CARDIOVERSION;  Surgeon: Thayer Headings, MD;  Location: [facility] ENDOSCOPY;  Service: Cardiovascular;  Laterality: N/A;   COLONOSCOPY       FOOT FRACTURE SURGERY   10/2013   TONSILLECTOMY AND ADENOIDECTOMY       TOTAL KNEE ARTHROPLASTY Left 03/22/2016    Procedure: TOTAL KNEE ARTHROPLASTY;  Surgeon: Dorna Leitz, MD;  Location: [facility];  Service: Orthopedics;  Laterality: Left;   TUBAL LIGATION   1981   VAGINAL HYSTERECTOMY   04/20/05    prolapse, adenomyosis      Assessment & Plan Clinical Impression: Angela Morales is a 75  year old female with history of severe lumbar scoliosis, celiac disease, A fib s/p ablation 02/22, SVT who was originally admitted on 02/06/2021 with diffuse progressive weakness  R>L with inability to walk due to AIDP. She was treated with IVIG but continued to worsen with aspiration event requiring intubation for hypoxic hypercarbic respiratory failure. She was treated for aspiration pneumonia, but hospital course significant for A fib with RVR, progressive hyponatremia, early acute cholecystitis as well as hypotension requiring pressures. She required tracheostomy prior to wean to ATC and ultimately decannulated by 09/06.  She continued to have significant impairments in mobility and ADLs and was admitted to CIR on 03/04/2021. She was making gains but developed A. flutter with hypotension on 03/13/2021 requiring discharge to acute hospital for telemetry/management. She was started on amiodarone drip but continued to have issues with A flutter. She  underwent DC cardioversion on by Dr. Acie Fredrickson on 03/16/2021 and remains in NSR. Therapy resumed and she continues to be limited by bilateral lower extremity weakness, increased tone, balance and postural deficits. CIR recommended due to functional decline.    Patient transferred to CIR on 03/20/2021 .    Pt presents with decreased activity tolerance, decreased functional mobility, decreased balance, decreased coordination Limiting pt's independence with leisure/community pursuits. Met with pt briefly today to discuss leisure interests & provide education on activity analysis and identifying potential modifications.  Discussion focused on adaptive gardening techniques (pots, planters) as well as accessible  nature trails locally and beyond.  Pt is motivated to regain further independence and return to previously enjoyed activities. Plan  No further TR as pt is discharging  10/12  Recommendations for other services: None   Discharge Criteria: Patient will be discharged from TR if patient refuses treatment 3 consecutive times without medical reason.  If treatment goals not met, if there is a change in medical status, if patient makes no progress  towards goals or if patient is discharged from hospital.  The above assessment, treatment plan, treatment alternatives and goals were discussed and mutually agreed upon: by patient  [place] 04/02/2021, 4:21 PM

## 2021-04-02 NOTE — Progress Notes (Signed)
Physical Therapy Session Note  Patient Details  Name: Angela Morales MRN: [id number] Date of Birth: 1946-02-02  Today's Date: 04/02/2021 PT Individual Time: 1135-1200 and 1300-1400 PT Individual Time Calculation (min): 25 min and 60 min  Short Term Goals: Week 2:  PT Short Term Goal 1 (Week 2): =LTG due to ELOS  Skilled Therapeutic Interventions/Progress Updates: Tx1: Pt presented in w/c agreeable to additional therapy time. Pt transported outside for environmental change. Pt participated in gentle LE strengthening in preparation for next session after lunch. Pt performed LAQ with 2lb weight, AA hip flexion, hip abd/add, manually resisted hamstring pulls, and hip ER with red theraband. All activities performed ~10-12 bilaterally with rest breaks provided for recovery. Pt transported back to room at end of session and very appreciative of time spent outside. Pt remained in w/c at end of session with call bell within reach and needs met.   Tx2: Pt presented in w/c with nsg present as pt just completed toileting. Pt agreeable to therapy and denies any increased pain at start of session. Pt propelled to MW nsg station with supervision but increased time and pt intermittently drifted to R due to hand deformity. Pt transported remaining distance for energy conservation. Pt was able to position w/c at mat with supervision and independently lock breaks and cues for unlocking arm rest. Performed Slide board transfer to mat with close supervision. At [facility] discussed car transfer with pt indicating received measurements for both vehicles. Per pt 25" (maybe 27") and 31". Pt initially felt comfortable with 25in height however when PTA raised mat to 25in realized higher than anticipated, pt stated may check into height of son or DIL's vehicle to possibly use. Pt then participated in Sit to stand x 5 from elevated mat initially requiring minA then requiring modA with fatigue. Cues provided to increase anterior lean  and to push from mat. Pt then performed Sit to stand and performed toe taps to target x 5 bilaterally however requiring seated rest between sides. Tactile cues to engage quad on contralateral LE when performing activity. Due to increased congestion and volume in room PTA and pt agreeable to change locations to complete session. Pt performed Slide board transfer to w/c with CGA.  Pt then transported to ortho gym and performed Slide board transfer at 23in height requiring minA. Performed Sit to stand with minA and performed weight shifting and initiating march. Pt unable to fully clear foot off floor but was able to weight shift without knee buckling. Once task completed pt performed Slide board transfer back to w/c in same manner as prior. Pt transported back to room and performed Slide board transfer to bed per pt request. Pt required minA for BLE management for sit to supine. Pt repositioned to comfort and left with bed alarm on, call bell within reach and needs met.      Therapy Documentation Precautions:  Precautions Precautions: Fall Required Braces or Orthoses: Other Brace Other Brace: bilateral ankle air splints, PRAFOs Restrictions Weight Bearing Restrictions: No General:   Vital Signs: Therapy Vitals Temp: 98.2 F (36.8 C) Pulse Rate: 81 Resp: 17 BP: 133/78 Patient Position (if appropriate): Lying Oxygen Therapy SpO2: 100 % O2 Device: Room Air Pain: Pain Assessment Pain Scale: 0-10 Pain Score: 0-No pain   Therapy/Group: Individual Therapy  Jessamine Barcia Towana Stenglein, PTA  04/02/2021, 4:11 PM

## 2021-04-03 MED ORDER — BETHANECHOL CHLORIDE 25 MG PO TABS
25.0000 mg | ORAL_TABLET | Freq: Every day | ORAL | Status: DC
  Administered 2021-04-04 – 2021-04-05 (×2): 25 mg via ORAL
  Filled 2021-04-03 (×2): qty 1

## 2021-04-03 NOTE — Progress Notes (Signed)
Physical Therapy Session Note  Patient Details  Name: Angela Morales MRN: [id number] Date of Birth: [birth year]  Today's Date: 04/03/2021 PT Individual Time: 0905-0959 PT Individual Time Calculation (min): 54 min   Short Term Goals: Week 2:  PT Short Term Goal 1 (Week 2): =LTG due to ELOS  Skilled Therapeutic Interventions/Progress Updates:     Pt received seated in [facility] and agrees to therapy. Reports mild pain in L knee. Number not provided. PT provides rest breaks as needed to manage pain. WC transport to gym for time management. Pt performs sliding board transfer to Nustep with assist from PT just to place slideboard. Pt completes x10:00 total on Nustep at workload of 5 with average steps per minute ~40. PT utilizes blue therband to maintain pt's feet in proper position for maximum benefit of activity. Performed for strength and endurance training. Pt performs slideboard transfer from nustep>WC>mat with setup assistance. On mat table, pt works on Sports administrator of sit to stand. PT educates and demonstrates importance of anterior weight transition and hip flexion when initiation transfer. Pt then practices 2x10 partial stands, clearing buttocks from mat and WB through both legs. PT provides manual and verbal cues to maintain knees in neutral position with hips abductors engaged, as pt tends to internally rotate and adduct hips at baseline. Pt then performs x3 reps of sit to stand with modA and without AD, with same ces as well as tactile cueing at glutes for engagement and sternum for upright posture. Pt transfers from mat>WC>bed with sliding board and setup assistance. Sit to supine with modA management of Bilateral lower extremities. Left supine with alarm intact and all needs within reach.  Therapy Documentation Precautions:  Precautions Precautions: Fall Required Braces or Orthoses: Other Brace Other Brace: bilateral ankle air splints, PRAFOs Restrictions Weight Bearing  Restrictions: No    Therapy/Group: Individual Therapy  Breck Coons, PT, DPT 04/03/2021, 4:07 PM

## 2021-04-03 NOTE — Progress Notes (Signed)
Physical Therapy Session Note  Patient Details  Name: LUSINE CORLETT MRN: [id number] Date of Birth: [birth year]  Today's Date: 04/03/2021 PT Individual Time: 1027-2536 PT Individual Time Calculation (min): 38 min   Short Term Goals:  Week 2:  PT Short Term Goal 1 (Week 2): =LTG due to ELOS   Skilled Therapeutic Interventions/Progress Updates:   Pt received sitting in WC and agreeable to PT. WC mobility through hall with supervision assist  with cues for direction in hall x 131f.   Sit<>stand in parallel bars with min=mod assist and heavy use of BLE to pull on rails. Pregait stepping with BUE support and then gait training with BUE support 2 x 874feach direction. Min assist throughout with no ankle or knee instability noted.   Pt returned to room and performed Slide board transfer to bed with set up assist only.Sit>supine completed with  min assist and BLE and left supine in bed with call bell in reach and all needs met.          Therapy Documentation Precautions:  Precautions Precautions: Fall Required Braces or Orthoses: Other Brace Other Brace: bilateral ankle air splints, PRAFOs Restrictions Weight Bearing Restrictions: No  Vital Signs: Therapy Vitals Temp: 97.9 F (36.6 C) Pulse Rate: 90 Resp: 18 BP: 107/63 Patient Position (if appropriate): Sitting Oxygen Therapy SpO2: 98 % O2 Device: Room Air Pain:  denies    Therapy/Group: Individual Therapy  AuLorie Phenix0/12/2020, 5:28 PM

## 2021-04-03 NOTE — Progress Notes (Signed)
Occupational Therapy Session Note  Patient Details  Name: Angela Morales MRN: [id number] Date of Birth: Jun 18, 1946  Today's Date: 04/03/2021 OT Individual Time: 0700-0745 OT Individual Time Calculation (min): 45 min    Short Term Goals: Week 1:  OT Short Term Goal 1 (Week 1): Pt will complete BSC or toilet trasnfer with 1 assist OT Short Term Goal 1 - Progress (Week 1): Met OT Short Term Goal 2 (Week 1): Pt will complete LB dressing with no more than Max A using adaptive techniques OT Short Term Goal 2 - Progress (Week 1): Progressing toward goal OT Short Term Goal 3 (Week 1): Pt will direct care for 1 OOB trasnfer given min cues in prep for directing care to caregivers during ADL routine at home OT Short Term Goal 3 - Progress (Week 1): Met Week 2:  OT Short Term Goal 1 (Week 2): Pt will complete LB dressing with no more than Max A using adaptive techniques OT Short Term Goal 1 - Progress (Week 2): Met OT Short Term Goal 2 (Week 2): Pt will perform SB transfers with min A OT Short Term Goal 2 - Progress (Week 2): Met OT Short Term Goal 3 (Week 2): Pt will perform toileting tasks seated on BSC with max A OT Short Term Goal 3 - Progress (Week 2): Met Week 3:  OT Short Term Goal 1 (Week 3): STG=LTG 2/2 ELOS (continue working towards min A/supervision goals)  Skilled Therapeutic Interventions/Progress Updates:    Pt received in bed with no c/o pain and agreeable to OT focusing on activity tolerance, self-care and functional transfers. States she has been up since 4:30 AM with increased anxiety about going home. She reports husband has not been present for therapy sessions. Emotional support provided by OT, and she was provided with numbers for social workers. Husband does not answer calls from numbers he doesn't know - pt was asked to provide him with numbers of social workers so he could answer calls from hospital in regards to family education dates.   ADL: Pt completes BADL at overall  MIN level. Skilled interventions include: Pt with c/o numbness and tingling in distal feet + hands. Pt able to direct care and ask for items needed for bathing/dressing. Pt completes bed mobility supine > EOB with close (S). SB used to transfer EOB > wc with CGA. Pt declined towel on top of SB for transfer - pt educated on purpose of towel as barrier between SB and bare skin as she did not have pants on. Doffed shirt with close (S) and completed UB bathing with setup. Pt able to wash upper thighs (declined need for peri-care) but required TOTAL A for distal legs. Socks, shoes and aircasts TOTAL A. Pt able to perform sit > stand at sink with MIN A of 2, and stood while OT facilitated pulling pants over hips.   Pt left at end of session in wc at sink, call light in reach and all needs met.    Therapy Documentation Precautions:  Precautions Precautions: Fall Required Braces or Orthoses: Other Brace Other Brace: bilateral ankle air splints, PRAFOs Restrictions Weight Bearing Restrictions: No   Therapy/Group: Individual Therapy  Shabazz Mckey 04/03/2021, 6:54 AM

## 2021-04-03 NOTE — Progress Notes (Signed)
Occupational Therapy Session Note  Patient Details  Name: DACOTA DEVALL MRN: [id number] Date of Birth: 1946-02-10  Today's Date: 04/03/2021 OT Group Time: 2080-2233 OT Group Time Calculation (min): 60 min   Short Term Goals: Week 3:  OT Short Term Goal 1 (Week 3): STG=LTG 2/2 ELOS (continue working towards min A/supervision goals)  Skilled Therapeutic Interventions/Progress Updates:  Pt participated in group session with a focus on stress mgmt, education on healthy coping strategies, and social interaction. Focus of session on providing coping strategies to manage new current level of function as a result of new diagnosis.  Session focus on breaking down stressors into "daily hassles," "major life stressors" and "life circumstances" in an effort to allow pts to chunk their stressors into groups. Pt actively sharing stressors and contributing to group conversation. Offered education on factors that protect [place] against stress such as "daily uplifts," "healthy coping strategies" and "protective factors." Encouraged all group members to make an effort to actively recall one event from their day that was a daily uplift in an effort to protect their mindset from stressors. Issued pt handouts on healthy coping strategies to implement into routine. Pt transported back to room by RT.  Therapy Documentation Precautions:  Precautions Precautions: Fall Required Braces or Orthoses: Other Brace Other Brace: bilateral ankle air splints, PRAFOs Restrictions Weight Bearing Restrictions: No  Pain: Pt reports no pain during group session    Therapy/Group: Group Therapy  Precious Haws 04/03/2021, 4:12 PM

## 2021-04-03 NOTE — Progress Notes (Signed)
Patient ID: Angela Morales, female   DOB: 12/22/1945, 75 y.o.   MRN: [id number]  SW made contact with pt husband [name] to discuss family education, and d/c recommendations. Family edu scheduled for Tues (10/11) 1pm-3pm. SW informed on [place] therapies recommended, and DABSC. He is aware SW will f/u with his wife as well to discuss d/c recs. He also reported that it is ok for SW to speak with DIL Maura who left message asking about d/c needs.   SW returned phone call to pt DIL Cristela Blue [phone]) to discuss d/c needs. Reports they were able to get her a hospital bed. Asked questions with regard to further DME needs. SW informed once aware of all recommendations, this will be provided. Reports they are looking at [place]. SW informed will discuss everything with MIL. SW explained inpatient rehab process. SW informed will f/u once there are more DME recs.   *SW met with pt in room to discuss above. Pt confirms [facility] preference [facility]. Pt would like to have [facility]. SW informed will f/u when DME recs are finalized.   SW ordered [facility] with [employer] via parachute. SW still waiting on final DME recs. SW spoke with [facility] who is willing to accept referral and will f/u on all final [place] needs.   Loralee Pacas, MSW, [place] Office: [phone] Cell: [phone] Fax: [phone]

## 2021-04-03 NOTE — Progress Notes (Signed)
PROGRESS NOTE   Subjective/Complaints:  Pt reports takes a lot of meds- wondering if could d/c some of them.   Will decrease Bathanachol to 25 mg QHS and if still voiding on Monday, will d/c   No other meds can be d/c'd at this time.   ROS:   Pt denies SOB, abd pain, CP, N/V/C/D, and vision changes   Objective:   No results found. No results for input(s): WBC, HGB, HCT, PLT in the last 72 hours.   No results for input(s): NA, K, CL, CO2, GLUCOSE, BUN, CREATININE, CALCIUM in the last 72 hours.    Intake/Output Summary (Last 24 hours) at 04/03/2021 0930 Last data filed at 04/02/2021 1300 Gross per 24 hour  Intake 240 ml  Output --  Net 240 ml        Physical Exam: Vital Signs Blood pressure 117/64, pulse 84, temperature 98.4 F (36.9 C), temperature source Oral, resp. rate 16, height 5' 4"  (1.626 m), weight 60.1 kg, last menstrual period 04/20/2005, SpO2 97 %.            General: awake, alert, appropriate, working on dressing with OT;  NAD HENT: conjugate gaze; oropharynx moist CV: regular rate; no JVD Pulmonary: CTA B/L; no W/R/R- good air movement GI: soft, NT, ND, (+)BS Psychiatric: appropriate Neurological: Ox3 Sitting up in w/c.  Neuro:  Alert and oriented x 3. Normal insight and awareness. Intact Memory. Normal language and speech. Cranial nerve exam unremarkable. UE 4+/5. LE: 4/5 prox to 3+ to 4-/5 distally. Stocking glove sensory loss below knees bilaterally. DTR's absent. Neuro stable. Musculoskeletal: Full ROM, No pain with AROM or PROM in the neck, trunk, or extremities. Posture appropriate   L foot- has rotation/internal of L foot at rest- peroneus?  Assessment/Plan: 1. Functional deficits which require 3+ hours per day of interdisciplinary therapy in a comprehensive inpatient rehab setting. Physiatrist is providing close team supervision and 24 hour management of active medical problems  listed below. Physiatrist and rehab team continue to assess barriers to discharge/monitor patient progress toward functional and medical goals  Care Tool:  Bathing    Body parts bathed by patient: Right arm, Left arm, Chest, Abdomen, Front perineal area, Right upper leg, Left upper leg, Face, Buttocks   Body parts bathed by helper: Right lower leg, Left lower leg Body parts n/a: Buttocks, Right lower leg, Left lower leg   Bathing assist Assist Level: Minimal Assistance - Patient > 75%     Upper Body Dressing/Undressing Upper body dressing   What is the patient wearing?: Pull over shirt    Upper body assist Assist Level: Independent    Lower Body Dressing/Undressing Lower body dressing      What is the patient wearing?: Pants, Incontinence brief     Lower body assist Assist for lower body dressing: Moderate Assistance - Patient 50 - 74%     Toileting Toileting    Toileting assist Assist for toileting: Moderate Assistance - Patient 50 - 74%     Transfers Chair/bed transfer  Transfers assist     Chair/bed transfer assist level: Contact Guard/Touching assist Chair/bed transfer assistive device: Sliding board   Locomotion Ambulation  Ambulation assist   Ambulation activity did not occur: Safety/medical concerns  Assist level: 2 helpers Assistive device: Parallel bars Max distance: 10'   Walk 10 feet activity   Assist  Walk 10 feet activity did not occur: Safety/medical concerns  Assist level: 2 helpers Assistive device: Parallel bars   Walk 50 feet activity   Assist Walk 50 feet with 2 turns activity did not occur: Safety/medical concerns         Walk 150 feet activity   Assist Walk 150 feet activity did not occur: Safety/medical concerns         Walk 10 feet on uneven surface  activity   Assist Walk 10 feet on uneven surfaces activity did not occur: Safety/medical concerns         Wheelchair     Assist Is the patient  using a wheelchair?: Yes Type of Wheelchair: Manual Wheelchair activity did not occur: Safety/medical concerns  Wheelchair assist level: Supervision/Verbal cueing Max wheelchair distance: 100'    Wheelchair 50 feet with 2 turns activity    Assist    Wheelchair 50 feet with 2 turns activity did not occur: Safety/medical concerns   Assist Level: Supervision/Verbal cueing   Wheelchair 150 feet activity     Assist  Wheelchair 150 feet activity did not occur: Safety/medical concerns       Blood pressure 117/64, pulse 84, temperature 98.4 F (36.9 C), temperature source Oral, resp. rate 16, height 5' 4"  (1.626 m), weight 60.1 kg, last menstrual period 04/20/2005, SpO2 97 %.  Medical Problem List and Plan: 1.  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             -patient may shower             -ELOS/Goals: 17-20 days/min a  10/12 d/c Con't PT and OT-CIR 2.  Antithrombotics: -DVT/anticoagulation:  Pharmaceutical: Other (comment) --Eliquis             -antiplatelet therapy: N/A 3. Pain Management: Oxycodone  prn. Using every other day at this point             -reduce gabapentin to 362m qhs d/t dry mouth  9/30- will change Oxy to Norco 5/325 mg q4 hours prn since "too strong" sometimes- and makes her sleepy/slowed- so will see if helps.   10/4- norco prn- not taking much- con't regimen 4. Mood: LCSW to follow for evaluation and support.              -antipsychotic agents: N/A 5. Neuropsych: This patient is capable of making decisions on her own behalf. 6. Skin/Wound Care: Routine pressure relief measures.  7. Fluids/Electrolytes/Nutrition: Monitor I/Os.  Does not like nutritional supplements.              -9/26 ate a little better this weekend, 9/24 labs ok  10/3- d/c'd Prosource per pt request-   -KCL as below.  8. A flutter s/p DCCV: No in NSR on amiodarone and Eliquis             --Monitor HR/BP on TID  basis  10/5- HR controlled- con't regimen             Monitor with increased activity 9. Hypokalemia: Continue K dur for supplement             CMP reviewed 9/24  9/27- K+ 3.7- will recheck 2x/week   10/3- pt wants to stop KCL, however is maintaining K+ of  3.8 on 60 mEq daily- so don't feel comfortable changing dose- will ask if she wants to change to pills?  10/4- change KCL to pill from packet but still need 60 mEq daily-   10/5- changed pills to noon per pt request- hopefully will help nausea  10/6- Nausea resolved with KCL pills 10. Neurogenic bladder: Monitor voiding with PVRs/bladder scans,             -9/26 still with urine retention, sporadic 50-1100cc -increase Urecholine to 70m bid    -reduce gabapentin  9/27- will restart Flomax 0.4 mg qsupper and monitor- didn't really get muchbefore went into Afib with RVR  10/3- increasing Flomax to 0.8 mg qsupper-    `0/5- voiding well- on higher dose of Flomax- is working!- bladder scans <115 cc- con't regimen  10/7- will decrease bethanachol to 25 mg QHS and if tolerated- will stop it Monday.  11. 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 9/29- LBM this AM- con't regimen   10/3- going regularly, per pt- con't reigmen 12. Hyponatremia: Continues to vary from 131-134.               9/26 133  10/3- Na 135- con't to monitor 13. N/V-   9/29- not constipated, but will con't Compazine prn.   10/5- nausea might be from Kcl-- will monitor  10/6- nausea resolved with KCL pills      LOS: 14 days A FACE TO FACE EVALUATION WAS PERFORMED  Angela Morales 04/03/2021, 9:30 AM

## 2021-04-04 DIAGNOSIS — R339 Retention of urine, unspecified: Secondary | ICD-10-CM

## 2021-04-04 DIAGNOSIS — M549 Dorsalgia, unspecified: Secondary | ICD-10-CM

## 2021-04-04 NOTE — Progress Notes (Addendum)
PROGRESS NOTE   Subjective/Complaints:  Overall doing well. C/o mid back pain which she thinks is from pushing it on parallel bars. Questioned whether some of meds could be d'ced before goinghome  ROS: Patient denies fever, rash, sore throat, blurred vision, nausea, vomiting, diarrhea, cough, shortness of breath or chest pain,  headache, or mood change.    Objective:   No results found. No results for input(s): WBC, HGB, HCT, PLT in the last 72 hours.   No results for input(s): NA, K, CL, CO2, GLUCOSE, BUN, CREATININE, CALCIUM in the last 72 hours.    Intake/Output Summary (Last 24 hours) at 04/04/2021 0931 Last data filed at 04/04/2021 0847 Gross per 24 hour  Intake 360 ml  Output --  Net 360 ml        Physical Exam: Vital Signs Blood pressure 120/65, pulse 82, temperature 97.7 F (36.5 C), temperature source Oral, resp. rate 18, height 5' 4"  (1.626 m), weight 60.1 kg, last menstrual period 04/20/2005, SpO2 99 %.  Constitutional: No distress . Vital signs reviewed. HEENT: NCAT, EOMI, oral membranes moist Neck: supple Cardiovascular: RRR without murmur. No JVD    Respiratory/Chest: CTA Bilaterally without wheezes or rales. Normal effort    GI/Abdomen: BS +, non-tender, non-distended Ext: no clubbing, cyanosis,   Psych: pleasant and cooperative  Neuro:  Alert and oriented x 3. Normal insight and awareness. Intact Memory. Normal language and speech. Cranial nerve exam unremarkable. UE 4+/5. LE: 4/5 prox to 3+ to 4-/5 distally. Stocking glove sensory loss below knees bilaterally. DTR's absent. Neuro stable. Musculoskeletal: Full ROM, No pain with AROM or PROM in the neck, trunk, or extremities. Posture appropriate   L foot- has rotation/internal of L foot at rest- peroneus?  Assessment/Plan: 1. Functional deficits which require 3+ hours per day of interdisciplinary therapy in a comprehensive inpatient rehab  setting. Physiatrist is providing close team supervision and 24 hour management of active medical problems listed below. Physiatrist and rehab team continue to assess barriers to discharge/monitor patient progress toward functional and medical goals  Care Tool:  Bathing    Body parts bathed by patient: Right arm, Left arm, Chest, Abdomen, Right upper leg, Left upper leg, Face   Body parts bathed by helper: Right lower leg, Left lower leg Body parts n/a: Buttocks, Front perineal area   Bathing assist Assist Level: Minimal Assistance - Patient > 75%     Upper Body Dressing/Undressing Upper body dressing   What is the patient wearing?: Pull over shirt    Upper body assist Assist Level: Independent    Lower Body Dressing/Undressing Lower body dressing      What is the patient wearing?: Pants, Incontinence brief     Lower body assist Assist for lower body dressing: Moderate Assistance - Patient 50 - 74%     Toileting Toileting    Toileting assist Assist for toileting: Moderate Assistance - Patient 50 - 74%     Transfers Chair/bed transfer  Transfers assist     Chair/bed transfer assist level: Contact Guard/Touching assist Chair/bed transfer assistive device: Sliding board   Locomotion Ambulation   Ambulation assist   Ambulation activity did not occur: Safety/medical concerns  Assist level: 2 helpers Assistive device: Parallel bars Max distance: 10'   Walk 10 feet activity   Assist  Walk 10 feet activity did not occur: Safety/medical concerns  Assist level: 2 helpers Assistive device: Parallel bars   Walk 50 feet activity   Assist Walk 50 feet with 2 turns activity did not occur: Safety/medical concerns         Walk 150 feet activity   Assist Walk 150 feet activity did not occur: Safety/medical concerns         Walk 10 feet on uneven surface  activity   Assist Walk 10 feet on uneven surfaces activity did not occur: Safety/medical  concerns         Wheelchair     Assist Is the patient using a wheelchair?: Yes Type of Wheelchair: Manual Wheelchair activity did not occur: Safety/medical concerns  Wheelchair assist level: Supervision/Verbal cueing Max wheelchair distance: 100'    Wheelchair 50 feet with 2 turns activity    Assist    Wheelchair 50 feet with 2 turns activity did not occur: Safety/medical concerns   Assist Level: Supervision/Verbal cueing   Wheelchair 150 feet activity     Assist  Wheelchair 150 feet activity did not occur: Safety/medical concerns       Blood pressure 120/65, pulse 82, temperature 97.7 F (36.5 C), temperature source Oral, resp. rate 18, height 5' 4"  (1.626 m), weight 60.1 kg, last menstrual period 04/20/2005, SpO2 99 %.  Medical Problem List and Plan: 1.  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             -patient may shower             -ELOS/Goals: 17-20 days/min a  10/12 d/c -Continue CIR therapies including PT, OT -it appears she has had medication discussion already with Dr. Dagoberto Ligas. I recommended that she discuss further with her at discharge next week about what can be discontinued at that time. Hopefully urecholine will fall off Monday  2.  Antithrombotics: -DVT/anticoagulation:  Pharmaceutical: Other (comment) --Eliquis             -antiplatelet therapy: N/A 3. Pain Management: Oxycodone  prn. Using every other day at this point             -reduce gabapentin to 337m qhs d/t dry mouth  9/30- will change Oxy to Norco 5/325 mg q4 hours prn since "too strong" sometimes- and makes her sleepy/slowed- so will see if helps.   10/4- norco prn- not taking much- con't regimen  10/8 order kpad for mid back pain 4. Mood: LCSW to follow for evaluation and support.              -antipsychotic agents: N/A 5. Neuropsych: This patient is capable of making decisions on her own behalf. 6. Skin/Wound Care:  Routine pressure relief measures.  7. Fluids/Electrolytes/Nutrition: Monitor I/Os.  Does not like nutritional supplements.              -9/26 ate a little better this weekend, 9/24 labs ok  10/3- d/c'd Prosource per pt request-   -KCL as below.  8. A flutter s/p DCCV: No in NSR on amiodarone and Eliquis             --Monitor HR/BP on TID basis  10/5- HR controlled- con't regimen             Monitor with increased activity 9. Hypokalemia: Continue K dur for  supplement             CMP reviewed 9/24  9/27- K+ 3.7- will recheck 2x/week   10/3- pt wants to stop KCL, however is maintaining K+ of 3.8 on 60 mEq daily- so don't feel comfortable changing dose- will ask if she wants to change to pills?  10/4- change KCL to pill from packet but still need 60 mEq daily-   10/5- changed pills to noon per pt request- hopefully will help nausea  10/6- Nausea resolved with KCL pills 10. Neurogenic bladder: Monitor voiding with PVRs/bladder scans,             -9/26 still with urine retention, sporadic 50-1100cc -increase Urecholine to 35m bid    -reduce gabapentin  9/27- will restart Flomax 0.4 mg qsupper and monitor- didn't really get muchbefore went into Afib with RVR  10/3- increasing Flomax to 0.8 mg qsupper-    `0/5- voiding well- on higher dose of Flomax- is working!- bladder scans <115 cc- con't regimen  10/7-  decreased bethanachol to 25 mg QHS and if tolerated- will stop  Monday.  11. 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 9/29- LBM this AM- con't regimen   10/3- going regularly, per pt- con't reigmen 12. Hyponatremia: Continues to vary from 131-134.               9/26 133  10/3- Na 135- con't to monitor 13. N/V-   9/29- not constipated, but will con't Compazine prn.   10/5- nausea might be from Kcl-- will monitor  10/6- nausea resolved with KCL pills      LOS: 15 days A FACE TO FACE EVALUATION WAS PERFORMED  ZMeredith Staggers10/01/2021, 9:31  AM

## 2021-04-05 NOTE — Progress Notes (Signed)
Physical Therapy Session Note  Patient Details  Name: Angela Morales MRN: [id number] Date of Birth: 1945-07-20  Today's Date: 04/05/2021 PT Individual Time: 1345-1435 PT Individual Time Calculation (min): 50 min   Short Term Goals: Week 2:  PT Short Term Goal 1 (Week 2): =LTG due to ELOS  Skilled Therapeutic Interventions/Progress Updates: Pt presented in bed agreeable to therapy. Pt c/o mild back pain, noted to have Kpack on bed. PTA threaded pants maxA for time management and pt performed rolling L/R with minA for BLE management to pull pants over hips. Pt then performed supine to sit with supervision and use of bed features. Performed Slide board transfer to w/c with pt demonstrating improvement in placing Slide board independently. Pt then transported to rehab gym and performed Slide board transfer to 23in height mat. Pt was able to complete with set up. PTA and pt then discussed at length car transfer, pt thinks with thick w/c cushion and if husband parks at curb may be able to do without issue. Discussed also about getting out of vehicle and that person should be present to assist. Pt willing to try stand pivot transfer to simulate getting out of car as would be from higher surface. Pt was able to perform Sit to stand from ~25in surface with minA, after seated rest and +2 present for safety pt performed stand pivot with modA to w/c! Pt then agreeable to try car simulator. Transported to ortho gym and performed Slide board transfer to 25in height with minA nearing CGA. Pt was then able to perform another stand pivot out of car to w/c with minA. Pt then transported back to room and requested to use bathroom prior to returning to bed. Pt set up with Stedy and required modA due to fatigue and lower height to stand. Required minA for brief management (+urinary void) and modA to stand from toilet. Pt transferred to bed via stedy and required minA for BLE management. Pt repositioned to comfort and left  with bed alarm on, call bell within reach and needs met.     Therapy Documentation Precautions:  Precautions Precautions: Fall Required Braces or Orthoses: Other Brace Other Brace: bilateral ankle air splints, PRAFOs Restrictions Weight Bearing Restrictions: No General:   Vital Signs: Therapy Vitals Temp: 98.7 F (37.1 C) Temp Source: Oral Pulse Rate: 83 Resp: 17 Patient Position (if appropriate): Lying Oxygen Therapy SpO2: 97 % O2 Device: Room Air Pain:   Mobility:   Locomotion :    Trunk/Postural Assessment :    Balance:   Exercises:   Other Treatments:      Therapy/Group: Individual Therapy  Nataley Bahri 04/05/2021, 3:55 PM

## 2021-04-05 NOTE — Plan of Care (Signed)
  Problem: Consults Goal: RH SPINAL CORD INJURY PATIENT EDUCATION Description:  See Patient Education module for education specifics.  Outcome: Progressing Goal: Diabetes Guidelines if Diabetic/Glucose > 140 Description: If diabetic or lab glucose is > 140 mg/dl - Initiate Diabetes/Hyperglycemia Guidelines & Document Interventions  Outcome: Progressing   Problem: SCI BLADDER ELIMINATION Goal: RH STG MANAGE BLADDER WITH ASSISTANCE Description: STG Manage Bladder With Min Assistance Outcome: Progressing   Problem: RH SAFETY Goal: RH STG ADHERE TO SAFETY PRECAUTIONS W/ASSISTANCE/DEVICE Description: STG Adhere to Safety Precautions With Min Assistance/Device. Outcome: Progressing Goal: RH STG DECREASED RISK OF FALL WITH ASSISTANCE Description: STG Decreased Risk of Fall With [employer]. Outcome: Progressing   Problem: RH PAIN MANAGEMENT Goal: RH STG PAIN MANAGED AT OR BELOW PT'S PAIN GOAL Description: < 3 on a 0-10 pain scale. Outcome: Progressing   Problem: RH KNOWLEDGE DEFICIT SCI Goal: RH STG INCREASE KNOWLEDGE OF SELF CARE AFTER SCI Description: Patient will demonstrate knowledge of medication management, pain management, and weight bearing precautions with educational materials and handouts provided by staff independently at discharge. Outcome: Progressing

## 2021-04-06 LAB — BASIC METABOLIC PANEL
Anion gap: 8 (ref 5–15)
BUN: 8 mg/dL (ref 8–23)
CO2: 25 mmol/L (ref 22–32)
Calcium: 8.6 mg/dL — ABNORMAL LOW (ref 8.9–10.3)
Chloride: 101 mmol/L (ref 98–111)
Creatinine, Ser: 0.58 mg/dL (ref 0.44–1.00)
GFR, Estimated: >60 mL/min (ref >60)
Glucose, Bld: 92 mg/dL (ref 70–99)
Potassium: 4.2 mmol/L (ref 3.5–5.1)
Sodium: 134 mmol/L — ABNORMAL LOW (ref 135–145)

## 2021-04-06 LAB — CBC
HCT: 38.6 % (ref 36.0–46.0)
Hemoglobin: 12.7 g/dL (ref 12.0–15.0)
MCH: 31.5 pg (ref 26.0–34.0)
MCHC: 32.9 g/dL (ref 30.0–36.0)
MCV: 95.8 fL (ref 80.0–100.0)
Platelets: 223 10*3/uL (ref 150–400)
RBC: 4.03 MIL/uL (ref 3.87–5.11)
RDW: 14.9 % (ref 11.5–15.5)
WBC: 6.3 10*3/uL (ref 4.0–10.5)
nRBC: 0 % (ref 0.0–0.2)

## 2021-04-06 NOTE — Progress Notes (Signed)
Physical Therapy Session Note  Patient Details  Name: Angela Morales MRN: [id number] Date of Birth: [birth year]  Today's Date: 04/06/2021 PT Individual Time: 0900-1015 PT Individual Time Calculation (min): 75 min   Short Term Goals: Week 2:  PT Short Term Goal 1 (Week 2): =LTG due to ELOS  Skilled Therapeutic Interventions/Progress Updates:    Pt received seated in w/c in room, agreeable to PT session. No complaints of pain this AM. Sit to stand with mod A to RW throughout session, cues for anterior weight shift and widening BOS to decrease L ankle inversion in standing. Pt is max A to don pants in standing and pull up over hips. Manual w/c propulsion 2 x 100 ft with use of BUE at Supervision level before onset of fatigue. Sit to stand with mod A to RW throughout session. Stand pivot transfer w/c to/from mat table x 10 reps during session with min A, cues for sequencing. Ambulation x 23 ft, x 30 ft with RW and min A for balance with close w/c follow for safety. Pt stands with flexed trunk and hips in attempt to visualize LE during gait due to impaired proprioception, improves with cueing. Pt exhibits increase in L ankle inversion with onset of fatigue even with use of aircasts, contacted orthotist to assess next session as pt is more ambulatory that previous consultation. Pt requesting to practice rolling in bed as she is not currently able to roll independently. Sit to supine mod A for BLE management. Pt able to roll L/R with cues for knee flexion and reaching for bedrail with UE at Supervision level. Pt returns to sitting EOM with some CGA for trunk control. Standing BLE strengthening therex: marches, squats 2 x 10 reps each. Car transfer via stand pivot transfer with RW and min A, mod A required to bring LE in/out of car. Pt continues to exhibit improved ability to perform standing transfers with RW this date. Pt returned to bed at end of session, needs in reach, bed alarm in place.  Therapy  Documentation Precautions:  Precautions Precautions: Fall Required Braces or Orthoses: Other Brace Other Brace: bilateral ankle air splints, PRAFOs Restrictions Weight Bearing Restrictions: No     Therapy/Group: Individual Therapy   Excell Seltzer, PT, DPT, CSRS  04/06/2021, 5:05 PM

## 2021-04-06 NOTE — Progress Notes (Signed)
Patient ID: Angela Morales, female   DOB: 04-Jan-1946, 75 y.o.   MRN: [id number]  SW ordered DME: w/c with curved cushion and slide board with [place] via parachute.   Loralee Pacas, MSW, [place] Office: [phone] Cell: [phone] Fax: [phone]

## 2021-04-06 NOTE — Progress Notes (Signed)
Physical Therapy Session Note  Patient Details  Name: Angela Morales MRN: [id number] Date of Birth: [birth year]  Today's Date: 04/06/2021 PT Individual Time: 1300-1357 PT Individual Time Calculation (min): 57 min   Short Term Goals: Week 2:  PT Short Term Goal 1 (Week 2): =LTG due to ELOS  Skilled Therapeutic Interventions/Progress Updates:     Pt seated in w/c to start session - agreeable to PT tx without reports of pain. She had her bilateral ankle air splints on throughout session. Transported in w/c to main rehab gym. Gait ~74f with minA and RW - gait deficits include flexed trunk and hips with downward gaze to monitor her LE placement, mild L ankle inversion that worsens with fatigue, even with the air cast splints.  - cues for normalizing gait pattern with postural awareness. Next, worked on standing balance and closed chain exercises for LE's. Worked on static standing without UE support while keeping body within walker frame - able to complete sets of 5-10 seconds without UE support with CGA for steadying. Also worked on [employer] standing from L<>R to promote lateral weight shifts and thoracic rotation into a dynamic standing task. Incorporated alternating toe taps to ~3-4inch platform with RW support and minA for balance as well as alternating step overs (over hockey stick) with RW and minA. Used visual targets to improve foot placement due to decreased proprioception in LE's. Increased difficulty with L>R hip flexion during these tasks as well as L>R foot placement accuracy. Assisted back to her w/c via stand<>pivot transfer with minA and RW - required mod cues for safety approach to RW, especially while stepping backwards. She was transported back to her room where she remained seated in w/c with all her needs in reach at conclusion of session. She requested nursing assistance for toileting - RN was notified.  *Pt needing extended rest breaks throughout session, in between  therapeutic activity, due to fatigue. Discussed general DC planning, energy conservation, role of f/u therapies, etc during rest breaks.   Therapy Documentation Precautions:  Precautions Precautions: Fall Required Braces or Orthoses: Other Brace Other Brace: bilateral ankle air splints, PRAFOs Restrictions Weight Bearing Restrictions: No General:    Therapy/Group: Individual Therapy  CAlger Simons10/03/2021, 12:47 PM

## 2021-04-06 NOTE — Progress Notes (Signed)
Occupational Therapy Session Note  Patient Details  Name: Angela Morales MRN: [id number] Date of Birth: August 29, 1945  Today's Date: 04/06/2021 OT Individual Time: 1130-1157 OT Individual Time Calculation (min): 27 min   Short Term Goals: Week 3:  OT Short Term Goal 1 (Week 3): STG=LTG 2/2 ELOS (continue working towards min A/supervision goals)  Skilled Therapeutic Interventions/Progress Updates:    Pt greeted semi-reclined in bed and agreeable to OT treatment session. Min A to advance LE's out of bed. Slideboard transfer to wc with min A to set-up slideboard, then supervision for transfer.  UB there-ex using [employer] arm bike on level 3, 5 minutes forward and 5 minutes back without rest break. LB there-ex with seated tow taps on small cones with OT assist to get LLE up to cone. Pt returned to room and left seated in wc with needs met.  Therapy Documentation Precautions:  Precautions Precautions: Fall Required Braces or Orthoses: Other Brace Other Brace: bilateral ankle air splints, PRAFOs Restrictions Weight Bearing Restrictions: No Pain:  Denies pain   Therapy/Group: Individual Therapy  Valma Cava 04/06/2021, 12:11 PM

## 2021-04-06 NOTE — Progress Notes (Signed)
Nutrition Follow-up  DOCUMENTATION CODES:   Non-severe (moderate) malnutrition in context of chronic illness  INTERVENTION:  Boost Breeze po QID, each supplement provides 250 kcal and 9 grams of protein   Encourage PO intake  NUTRITION DIAGNOSIS:   Moderate Malnutrition related to chronic illness (GBS) as evidenced by mild fat depletion, mild muscle depletion, ongoing  GOAL:   Patient will meet greater than or equal to 90% of their needs; progressing  MONITOR:   PO intake, Supplement acceptance, Labs, Weight trends, Skin, I & O's  REASON FOR ASSESSMENT:   Consult Poor PO  ASSESSMENT:   75 year old female with PMH of severe lumbar scoliosis, celiac disease, atrial fibrillation, SVT. 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 Pt was treated with IVIG but continued to worsen with aspiration event requiring intubation. Pt required tracheostomy, was weaned to ATC, and was ultimately decannulated by 9/06. Pt continued to have significant impairments in mobility and ADLs and was admitted to CIR on 03/04/21. Pt was making progress but developed atrial flutter with hypotension on 03/13/21 requiring discharge to acute hospital for telemetry/management. Pt underwent DC cardioversion on 03/16/21 and remains in NSR. Pt re-admitted to CIR on 9/23.  Meal completion has been 50-100% with 100% intake at breakfast this morning and dinner last night. Pt has been tolerating her PO diet. RD to continue with current orders. Pt encouraged to eat her food at meals. Labs and medications reviewed.  Diet Order:   Diet Order             Diet gluten free Room service appropriate? Yes; Fluid consistency: Thin  Diet effective now                   EDUCATION NEEDS:   No education needs have been identified at this time  Skin:  Skin Assessment: Reviewed RN Assessment  Last BM:  10/9  Height:   Ht Readings from Last 1 Encounters:   03/20/21 5' 4"  (1.626 m)    Weight:   Wt Readings from Last 1 Encounters:  03/27/21 60.1 kg   BMI:  Body mass index is 22.74 kg/m.  Estimated Nutritional Needs:   Kcal:  1800-2000  Protein:  90-110 grams  Fluid:  >/= 1.8 L  Corrin Parker, MS, RD, LDN RD pager number/after hours weekend pager number on Amion.

## 2021-04-06 NOTE — Progress Notes (Signed)
PROGRESS NOTE   Subjective/Complaints:  Pt reports she's still wishing more meds could be stopped- explained will stop her bethanachol- today since voiding well.    ROS:  Pt denies SOB, abd pain, CP, N/V/C/D, and vision changes   Objective:   No results found. Recent Labs    04/06/21 0633  WBC 6.3  HGB 12.7  HCT 38.6  PLT 223     Recent Labs    04/06/21 0633  NA 134*  K 4.2  CL 101  CO2 25  GLUCOSE 92  BUN 8  CREATININE 0.58  CALCIUM 8.6*      Intake/Output Summary (Last 24 hours) at 04/06/2021 1353 Last data filed at 04/06/2021 0659 Gross per 24 hour  Intake 360 ml  Output --  Net 360 ml        Physical Exam: Vital Signs Blood pressure (!) 119/53, pulse 79, temperature 97.9 F (36.6 C), resp. rate 18, height 5' 4"  (1.626 m), weight 60.1 kg, last menstrual period 04/20/2005, SpO2 99 %.   General: awake, alert, appropriate, NAD HENT: conjugate gaze; oropharynx moist CV: regular rate; no JVD Pulmonary: CTA B/L; no W/R/R- good air movement GI: soft, NT, ND, (+)BS Psychiatric: appropriate Neurological: Ox3  Ext: no clubbing, cyanosis,   Psych: pleasant and cooperative  Neuro:  Alert and oriented x 3. Normal insight and awareness. Intact Memory. Normal language and speech. Cranial nerve exam unremarkable. UE 4+/5. LE: 4/5 prox to 3+ to 4-/5 distally. Stocking glove sensory loss below knees bilaterally. DTR's absent. Neuro stable. Musculoskeletal: Full ROM, No pain with AROM or PROM in the neck, trunk, or extremities. Posture appropriate   L foot- has rotation/internal of L foot at rest- peroneus?  Assessment/Plan: 1. Functional deficits which require 3+ hours per day of interdisciplinary therapy in a comprehensive inpatient rehab setting. Physiatrist is providing close team supervision and 24 hour management of active medical problems listed below. Physiatrist and rehab team continue to assess  barriers to discharge/monitor patient progress toward functional and medical goals  Care Tool:  Bathing    Body parts bathed by patient: Right arm, Left arm, Chest, Abdomen, Right upper leg, Left upper leg, Face   Body parts bathed by helper: Right lower leg, Left lower leg Body parts n/a: Buttocks, Front perineal area   Bathing assist Assist Level: Minimal Assistance - Patient > 75%     Upper Body Dressing/Undressing Upper body dressing   What is the patient wearing?: Pull over shirt    Upper body assist Assist Level: Independent    Lower Body Dressing/Undressing Lower body dressing      What is the patient wearing?: Pants, Incontinence brief     Lower body assist Assist for lower body dressing: Moderate Assistance - Patient 50 - 74%     Toileting Toileting    Toileting assist Assist for toileting: Moderate Assistance - Patient 50 - 74%     Transfers Chair/bed transfer  Transfers assist     Chair/bed transfer assist level: Contact Guard/Touching assist Chair/bed transfer assistive device: Sliding board   Locomotion Ambulation   Ambulation assist   Ambulation activity did not occur: Safety/medical concerns  Assist level: 2 helpers  Assistive device: Parallel bars Max distance: 10'   Walk 10 feet activity   Assist  Walk 10 feet activity did not occur: Safety/medical concerns  Assist level: 2 helpers Assistive device: Parallel bars   Walk 50 feet activity   Assist Walk 50 feet with 2 turns activity did not occur: Safety/medical concerns         Walk 150 feet activity   Assist Walk 150 feet activity did not occur: Safety/medical concerns         Walk 10 feet on uneven surface  activity   Assist Walk 10 feet on uneven surfaces activity did not occur: Safety/medical concerns         Wheelchair     Assist Is the patient using a wheelchair?: Yes Type of Wheelchair: Manual Wheelchair activity did not occur: Safety/medical  concerns  Wheelchair assist level: Supervision/Verbal cueing Max wheelchair distance: 100'    Wheelchair 50 feet with 2 turns activity    Assist    Wheelchair 50 feet with 2 turns activity did not occur: Safety/medical concerns   Assist Level: Supervision/Verbal cueing   Wheelchair 150 feet activity     Assist  Wheelchair 150 feet activity did not occur: Safety/medical concerns       Blood pressure (!) 119/53, pulse 79, temperature 97.9 F (36.6 C), resp. rate 18, height 5' 4"  (1.626 m), weight 60.1 kg, last menstrual period 04/20/2005, SpO2 99 %.  Medical Problem List and Plan: 1.  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             -patient may shower             -ELOS/Goals: 17-20 days/min a  10/12 d/c -Continue CIR therapies including PT, OT -it appears she has had medication discussion already with Dr. Dagoberto Ligas. I recommended that she discuss further with her at discharge next week about what can be discontinued at that time. Hopefully urecholine will fall off Monday  10/10- stop bethanachol- con't PT and OT- d/c Wednesday this week.  2.  Antithrombotics: -DVT/anticoagulation:  Pharmaceutical: Other (comment) --Eliquis             -antiplatelet therapy: N/A 3. Pain Management: Oxycodone  prn. Using every other day at this point             -reduce gabapentin to 371m qhs d/t dry mouth  9/30- will change Oxy to Norco 5/325 mg q4 hours prn since "too strong" sometimes- and makes her sleepy/slowed- so will see if helps.   10/4- norco prn- not taking much- con't regimen  10/8 order kpad for mid back pain  10/10- last used 10/4- won't send home on it, per pt.  4. Mood: LCSW to follow for evaluation and support.              -antipsychotic agents: N/A 5. Neuropsych: This patient is capable of making decisions on her own behalf. 6. Skin/Wound Care: Routine pressure relief measures.  7.  Fluids/Electrolytes/Nutrition: Monitor I/Os.  Does not like nutritional supplements.              -9/26 ate a little better this weekend, 9/24 labs ok  10/3- d/c'd [employer] per pt request-   -KCL as below.  8. A flutter s/p DCCV: No in NSR on amiodarone and Eliquis             --Monitor HR/BP on TID basis  10/5- HR controlled- con't regimen  Monitor with increased activity 9. Hypokalemia: Continue K dur for supplement             CMP reviewed 9/24  9/27- K+ 3.7- will recheck 2x/week   10/3- pt wants to stop KCL, however is maintaining K+ of 3.8 on 60 mEq daily- so don't feel comfortable changing dose- will ask if she wants to change to pills?  10/4- change KCL to pill from packet but still need 60 mEq daily-   10/5- changed pills to noon per pt request- hopefully will help nausea  10/6- Nausea resolved with KCL pills  10/10- K+ up to 4.2- which is actually perfect- would have pt f/u with PCP to have labs checked in next 1-2 weeks after d/c to see if can lower KCL dose.  10. Neurogenic bladder: Monitor voiding with PVRs/bladder scans,             -9/26 still with urine retention, sporadic 50-1100cc -increase Urecholine to 7m bid    -reduce gabapentin  9/27- will restart Flomax 0.4 mg qsupper and monitor- didn't really get muchbefore went into Afib with RVR  10/3- increasing Flomax to 0.8 mg qsupper-    `0/5- voiding well- on higher dose of Flomax- is working!- bladder scans <115 cc- con't regimen  10/7-  decreased bethanachol to 25 mg QHS and if tolerated- will stop  Monday.   10/10- stop bethanachol-  11. 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 9/29- LBM this AM- con't regimen   10/3- going regularly, per pt- con't reigmen 12. Hyponatremia: Continues to vary from 131-134.               9/26 133  10/3- Na 135- con't to monitor 13. N/V-   9/29- not constipated, but will con't Compazine prn.   10/5- nausea might be from Kcl-- will  monitor  10/6- nausea resolved with KCL pills      LOS: 17 days A FACE TO FACE EVALUATION WAS PERFORMED  Opie Maclaughlin 04/06/2021, 1:53 PM

## 2021-04-07 NOTE — Progress Notes (Signed)
Physical Therapy Discharge Summary  Patient Details  Name: Angela Morales MRN: [id number] Date of Birth: June 30, 1945  Today's Date: 04/07/2021 PT Individual Time: 1655-3748 PT Individual Time Calculation (min): 55 min   Patient has met 4 of 6 long term goals due to improved activity tolerance, improved balance, improved postural control, increased strength, and ability to compensate for deficits.  Patient to discharge at a wheelchair level Supervision.   Patient's care partner is independent to provide the necessary physical assistance at discharge. Pt's husband and son have completed hands-on family education and are safe to assist pt upon d/c home.  Reasons goals not met: Pt did not meet gait goal of 50 ft and she just initiated gait with RW on 10/10 and was able to ambulate 30 ft with RW min A. Pt also did not meet sit to stand goal of min A as she does require mod A at times to stand to RW. Pt has met goals adequately for a safe d/c home.  Recommendation:  Patient will benefit from ongoing skilled PT services in home health setting to continue to advance safe functional mobility, address ongoing impairments in endurance, strength, balance, safety, independence with functional mobility, and minimize fall risk.  Equipment: 18x18 w/c, slide board  Reasons for discharge: treatment goals met and discharge from hospital  Patient/family agrees with progress made and goals achieved: Yes  Skilled Intervention: Pt received seated in w/c in room with husband and son present for family education session. No complaints of pain. Reviewed sit to stand to RW, stand pivot transfer with RW, w/c management, how to safely perform car transfer, slide board transfers, and bed mobility. Pt's family demonstrates good ability to safely assist pt with mobility upon d/c home. Recommending that pt not perform gait training with family at this time, only with therapy. Orthotist recommending use of L ankle ASO for  improved stability in standing and during gait. Pt left seated in bed with needs in reach, bed alarm in place, family present.  PT Discharge Precautions/Restrictions Precautions Precautions: Fall Required Braces or Orthoses: Other Brace Other Brace: bilateral ankle air splints, PRAFOs Restrictions Weight Bearing Restrictions: No Pain Interference Pain Interference Pain Effect on Sleep: 1. Rarely or not at all Pain Interference with Therapy Activities: 1. Rarely or not at all Pain Interference with Day-to-Day Activities: 1. Rarely or not at all Vision/Perception  Vision - History Baseline Vision: Wears glasses all the time Patient Visual Report: No change from baseline Perception Perception: Within Functional Limits Praxis Praxis: Intact  Cognition Overall Cognitive Status: Within Functional Limits for tasks assessed Arousal/Alertness: Awake/alert Orientation Level: Oriented X4 Year: 2022 Attention: Sustained;Selective Focused Attention: Appears intact Sustained Attention: Appears intact Selective Attention: Appears intact Memory: Appears intact Awareness: Appears intact Problem Solving: Appears intact Safety/Judgment: Appears intact Sensation Sensation Light Touch: Impaired Detail Light Touch Impaired Details: Impaired RLE;Impaired LLE;Impaired RUE;Impaired LUE (impaired L>R, distal>proximal) Proprioception: Impaired Detail Proprioception Impaired Details: Absent RLE;Absent LLE Coordination Gross Motor Movements are Fluid and Coordinated: Yes Fine Motor Movements are Fluid and Coordinated: Yes Motor  Motor Motor: Abnormal postural alignment and control;Other (comment) Motor - Discharge Observations: improved from eval  Mobility Bed Mobility Bed Mobility: Rolling Right;Rolling Left;Supine to Sit;Sit to Supine Rolling Right: Independent with assistive device Rolling Left: Independent with assistive device Supine to Sit: Independent with assistive device Sit to  Supine: Moderate Assistance - Patient 50-74% Transfers Transfers: Sit to [facility];Lateral/Scoot Transfers Sit to Stand: Moderate Assistance - Patient 50-74% Stand Pivot Transfers:  Minimal Assistance - Patient > 75% Lateral/Scoot Transfers: Set up assist Transfer (Assistive device): Other (Comment) (slide board) Locomotion  Gait Ambulation: Yes Gait Assistance: Minimal Assistance - Patient > 75% Gait Distance (Feet): 30 Feet Assistive device: Rolling walker Gait Assistance Details: Verbal cues for technique;Verbal cues for gait pattern;Verbal cues for safe use of DME/AE Gait Gait: Yes Gait Pattern: Impaired Gait Pattern: Decreased hip/knee flexion - right;Decreased hip/knee flexion - left;Trunk flexed;Narrow base of support (L ankle inversion) Gait velocity: decreased Stairs / Additional Locomotion Stairs: No Pick up small object from the floor (from standing position) activity did not occur: Safety/medical concerns Architect: Yes Wheelchair Assistance: Independent with Camera operator: Both upper extremities Wheelchair Parts Management: Independent Distance: 150  Trunk/Postural Assessment  Cervical Assessment Cervical Assessment: Within Functional Limits Thoracic Assessment Thoracic Assessment: Exceptions to [facility] (scoliosis; kyphotic) Lumbar Assessment Lumbar Assessment: Exceptions to WFL (scoliosis) Postural Control Postural Control: Deficits on evaluation Righting Reactions: delayed  Balance Balance Balance Assessed: Yes Static Sitting Balance Static Sitting - Balance Support: No upper extremity supported;Feet supported Static Sitting - Level of Assistance: 6: Modified independent (Device/Increase time) Dynamic Sitting Balance Sitting balance - Comments: mod I during funcitonal activity Static Standing Balance Static Standing - Balance Support: Bilateral upper extremity supported;During functional  activity Static Standing - Level of Assistance: 4: Min assist Extremity Assessment   RLE Assessment RLE Assessment: Exceptions to [facility] General Strength Comments: impaired, see below RLE Strength RLE Overall Strength: Deficits Right Hip Flexion: 3-/5 Right Knee Flexion: 3+/5 Right Knee Extension: 4-/5 Right Ankle Dorsiflexion: 3-/5 LLE Assessment LLE Assessment: Exceptions to [facility] LLE Strength LLE Overall Strength: Deficits Left Hip Flexion: 3-/5 Left Knee Flexion: 3+/5 Left Knee Extension: 4/5 Left Ankle Dorsiflexion: 3/5     Excell Seltzer, PT, DPT, CSRS 04/07/2021, 5:21 PM

## 2021-04-07 NOTE — Progress Notes (Signed)
Patient ID: Angela Morales, female   DOB: 1945-12-12, 75 y.o.   MRN: [id number]  SW received updates from [employer] who reported curved cushion is not in stock. SW to discuss with pt.   SW met with pt, pt husband, and pt son in room to review discharge. Pt received DABSC. SW informed on cushion not in stock, and will ask vendor when in [place]. Pt opted to purchase item on own. Waiting on w/c and slide board to be delivered to the room.   Loralee Pacas, MSW, [place] Office: [phone] Cell: [phone] Fax: [phone]

## 2021-04-07 NOTE — Progress Notes (Signed)
Occupational Therapy Session Note  Patient Details  Name: Angela Morales MRN: [id number] Date of Birth: 1946/02/01  Today's Date: 04/07/2021 OT Individual Time: 1300-1345 OT Individual Time Calculation (min): 45 min    Short Term Goals: Week 3:  OT Short Term Goal 1 (Week 3): STG=LTG 2/2 ELOS (continue working towards min A/supervision goals)  Skilled Therapeutic Interventions/Progress Updates:    Pt resting in w/c upon arrival and husband arrived shortly after start. Husband present for education. OTA demonstrated SB transfers w/c<>bed and bed<>DABSC. Pt's husband return demonstrated bed>w/c and DABSC>bed SB transfers. Pt completes transfers with close supervision. Pt also lamb with RW approx 5'. Recommended walking only with therapy and NOT caregivers. Pt verbalized understanding. Demonstrated donning Air Casts. Pt's husband demonstrated w/c setup and placement for transfers. Pt independent with directing caregiver. Pt remained seated in w/c with husband present. All needs within reach. Pt pleased with progress and ready for discharge home tomorrow.   Therapy Documentation Precautions:  Precautions Precautions: Fall Required Braces or Orthoses: Other Brace Other Brace: bilateral ankle air splints, PRAFOs Restrictions Weight Bearing Restrictions: No   Pain:  Pt denies pain this afternoon   Therapy/Group: Individual Therapy  Leroy Libman 04/07/2021, 2:45 PM

## 2021-04-07 NOTE — Progress Notes (Signed)
Patient ID: Angela Morales, female   DOB: 04-16-1946, 75 y.o.   MRN: [id number]  SW confirmed w/c delivered.   *SW sent HHPT/OT to liaison [facility]. SW informed there will be a delay in start of care, likely to begin on Monday. SW informed patient, and she accepts this will happen and would like for someone to call her to schedule home visit. SW informed medical team in delay of start of care.   Loralee Pacas, MSW, [place] Office: [phone] Cell: [phone] Fax: [phone]

## 2021-04-07 NOTE — Patient Care Conference (Signed)
Inpatient RehabilitationTeam Conference and Plan of Care Update Date: 04/07/2021   Time: 11:08 AM    Patient Name: Angela Morales      Medical Record Number: [id number]  Date of Birth: 12/13/1945 Sex: Female         Room/Bed: 4M11C/4M11C-01 Payor Info: Payor: [employer] park manager MEDICARE / Plan: [facility] MEDICARE / Product Type: *No Product type* /    Admit Date/Time:  03/20/2021  3:55 PM  Primary Diagnosis:  GBS (Guillain-Barre syndrome) [facility])  Hospital Problems: Principal Problem:   GBS (Guillain-Barre syndrome) [facility])    Expected Discharge Date: Expected Discharge Date: 04/08/21  Team Members Present: Physician leading conference: Dr. Courtney Heys Social Worker Present: Loralee Pacas, [place] Nurse Present: Dorthula Nettles, RN PT Present: Excell Seltzer, PT OT Present: Roanna Epley, [place], OT PPS Coordinator present : Gunnar Fusi, SLP     Current Status/Progress Goal Weekly Team Focus  Bowel/Bladder   Pt continues to void, Minimal incontinent episodes. LBM 10/10  Continue to void, and have no incontinent episodes.  Biochemist, clinical and PRN   Swallow/Nutrition/ Hydration             ADL's   bathing-min A: LB dressing-min/mod A: SB DABSC transfers-supervision; toileting-mod A     education, functional transfers, safety awareness   Mobility   min to mod A bed mobility, Supervision SB transfer, mod A sit to stand, min A SPT with RW, gait up to 30 ft with RW assisted x 2  Supervision transfers and overall at w/c level, min A short distance gait  family edu, d/c planning   Communication             Safety/Cognition/ Behavioral Observations            Pain   Pain in lower back. PRN meds available.  <3  Assess pain Qshift and PRN   Skin   Skin intact  maintain skin integrity  Assess Qshift and PRN     Discharge Planning:  Pt to d/c to home with husband who works remotely. Pt needs to be atleast intermittent supervision at time of discharge. Pt husband to  hireprivate aide to assist when he is at work.   Team Discussion: Will not go home on Hydrocodone. Follow-up with primary physician. Ankle continues to roll-in. Walked 30 ft. Family has hired care givers. Incontinent B/B. No reported pain. Family education today. Patient on target to meet rehab goals: yes, at goal level. Ready for discharge.  *See Care Plan and progress notes for long and short-term goals.   Revisions to Treatment Plan:  Adjusting medications.  Teaching Needs: Family education, medication management, bowel/bladder management, transfer training, gait training, balance training, endurance training, safety awareness.  Current Barriers to Discharge: Decreased caregiver support, Medical stability, Home enviroment access/layout, Incontinence, Neurogenic bowel and bladder, Lack of/limited family support, Weight bearing restrictions, Medication compliance, and Nutritional means  Possible Resolutions to Barriers: Family education with spouse and caregivers. Continue current medications, educate bowel/bladder with spouse and caregivers.     Medical Summary Current Status: BS- still inconitnent of B/B- LBM 10/9- skin is good- voiding at least- not retaining anymore  Barriers to Discharge: Home enviroment access/layout;Incontinence;Neurogenic Bowel & Bladder  Barriers to Discharge Comments: d/c tomorrow- family education today Possible Resolutions to [employer]: ambulated 30 ft RW- might need AFO- RLE- at goal level- famiyl education today- hired caregiver for help- d/c tomorrow   Continued Need for Acute Rehabilitation Level of Care: The patient requires daily medical management by  a physician with specialized training in physical medicine and rehabilitation for the following reasons: Direction of a multidisciplinary physical rehabilitation program to maximize functional independence : Yes Medical management of patient stability for increased activity during participation  in an intensive rehabilitation regime.: Yes Analysis of laboratory values and/or radiology reports with any subsequent need for medication adjustment and/or medical intervention. : Yes   I attest that I was present, lead the team conference, and concur with the assessment and plan of the team.   Cristi Loron 04/07/2021, 5:18 PM

## 2021-04-07 NOTE — Progress Notes (Signed)
Orthopedic Tech Progress Note Patient Details:  Angela Morales 25-Jul-1945 [id number]  Ortho Devices Type of Ortho Device: ASO Ortho Device/Splint Location: LLE Ortho Device/Splint Interventions: Ordered, Application, Adjustment   Post Interventions Patient Tolerated: Well Instructions Provided: Adjustment of device, Care of device  [place] 04/07/2021, 3:59 PM

## 2021-04-07 NOTE — Progress Notes (Signed)
Occupational Therapy Discharge Summary  Patient Details  Name: Angela Morales MRN: [id number] Date of Birth: 18-May-1946  Patient has met 5 of 6 long term goals due to improved activity tolerance, improved balance, postural control, ability to compensate for deficits, management of  of  LEFT upper and LEFT lower extremity, and improved coordination.  Pt made good progress with BADLs and functional transfers during this admission. Pt requires min A for bathing at bed level or shower level. Mod A for LB dressing and independent for UB dressing. SB tranfsers with supervision to [facility] and min A for shower. Pt's husband has been present for therapy and provides approprialte level of assistance/supervision. Pt independent with HEP.Patient to discharge at [facility] Assist level.  Patient's care partner is independent to provide the necessary physical assistance at discharge.    Reasons goals not met: Pt requires mod A for LB dressing tasks seated EOB and for donning socks/shoes.  Recommendation:  Patient will benefit from ongoing skilled OT services in home health setting to continue to advance functional skills in the area of BADL.  Equipment: Drop Arm BSC  Reasons for discharge: treatment goals met and discharge from hospital  Patient/family agrees with progress made and goals achieved: Yes  OT Discharge  Vision Baseline Vision/History: 1 Wears glasses Patient Visual Report: No change from baseline Vision Assessment?: No apparent visual deficits Perception  Perception: Within Functional Limits Praxis Praxis: Intact Cognition Overall Cognitive Status: Within Functional Limits for tasks assessed Arousal/Alertness: Awake/alert Orientation Level: Oriented X4 Immediate Memory Recall: Sock;Blue;Bed Memory Recall Sock: Without Cue Memory Recall Blue: Without Cue Memory Recall Bed: Without Cue Awareness: Appears intact Problem Solving: Appears intact Safety/Judgment: Appears  intact Sensation Sensation Light Touch: Impaired Detail Light Touch Impaired Details: Impaired RLE;Impaired LLE;Impaired RUE;Impaired LUE Proprioception: Impaired Detail Proprioception Impaired Details: Absent LLE;Absent RLE Stereognosis: Not tested Coordination Gross Motor Movements are Fluid and Coordinated: Yes Fine Motor Movements are Fluid and Coordinated: Yes Motor  Motor Motor: Abnormal postural alignment and control;Other (comment) Motor - Skilled Clinical Observations: Decr LE stength Trunk/Postural Assessment  Cervical Assessment Cervical Assessment: Exceptions to [facility] Thoracic Assessment Thoracic Assessment: Exceptions to [facility] (scolios) Lumbar Assessment Lumbar Assessment: Exceptions to [facility] (scolios)  Balance Static Sitting Balance Static Sitting - Balance Support: Feet supported Static Sitting - Level of Assistance: 6: Modified independent (Device/Increase time) Dynamic Sitting Balance Sitting balance - Comments: mod I during funcitonal activity Extremity/Trunk Assessment RUE Assessment RUE Assessment: Within Functional Limits Active Range of Motion (AROM) Comments: WNL General Strength Comments: 4-/5 grossly LUE Assessment Active Range of Motion (AROM) Comments: WNL General Strength Comments: 4-/5 grossly   Leroy Libman 04/07/2021, 8:18 AM

## 2021-04-07 NOTE — Progress Notes (Signed)
PROGRESS NOTE   Subjective/Complaints:  Pt reports no change in voiding with Stopping bethanachol- Asking about plan for d/c and what will occur- going home with meds? Explained we send Rx's to her pharmacy of choice.   Ready for d/c tomorrow- dressing MUCH better.   ROS:   Pt denies SOB, abd pain, CP, N/V/C/D, and vision changes   Objective:   No results found. Recent Labs    04/06/21 0633  WBC 6.3  HGB 12.7  HCT 38.6  PLT 223     Recent Labs    04/06/21 0633  NA 134*  K 4.2  CL 101  CO2 25  GLUCOSE 92  BUN 8  CREATININE 0.58  CALCIUM 8.6*      Intake/Output Summary (Last 24 hours) at 04/07/2021 1011 Last data filed at 04/06/2021 1700 Gross per 24 hour  Intake 130 ml  Output --  Net 130 ml        Physical Exam: Vital Signs Blood pressure 114/62, pulse 81, temperature 97.8 F (36.6 C), resp. rate 16, height 5' 4"  (1.626 m), weight 60.1 kg, last menstrual period 04/20/2005, SpO2 98 %.    General: awake, alert, appropriate, working on getting pants on with OTA; NAD HENT: conjugate gaze; oropharynx moist CV: regular rate; no JVD Pulmonary: CTA B/L; no W/R/R- good air movement GI: soft, NT, ND, (+)BS Psychiatric: appropriate Neurological: Ox3  Ext: no clubbing, cyanosis,   Psych: pleasant and cooperative  Neuro:  Alert and oriented x 3. Normal insight and awareness. Intact Memory. Normal language and speech. Cranial nerve exam unremarkable. UE 4+/5. LE: 4/5 prox to 3+ to 4-/5 distally. Stocking glove sensory loss below knees bilaterally. DTR's absent. Neuro stable. Musculoskeletal: Full ROM, No pain with AROM or PROM in the neck, trunk, or extremities. Posture appropriate   L foot- has rotation/internal of L foot at rest- peroneus?  Assessment/Plan: 1. Functional deficits which require 3+ hours per day of interdisciplinary therapy in a comprehensive inpatient rehab setting. Physiatrist is  providing close team supervision and 24 hour management of active medical problems listed below. Physiatrist and rehab team continue to assess barriers to discharge/monitor patient progress toward functional and medical goals  Care Tool:  Bathing    Body parts bathed by patient: Right arm, Left arm, Chest, Abdomen, Right upper leg, Left upper leg, Face, Front perineal area, Right lower leg, Left lower leg   Body parts bathed by helper: Buttocks Body parts n/a: Buttocks, Front perineal area   Bathing assist Assist Level: Minimal Assistance - Patient > 75%     Upper Body Dressing/Undressing Upper body dressing   What is the patient wearing?: Pull over shirt    Upper body assist Assist Level: Independent    Lower Body Dressing/Undressing Lower body dressing      What is the patient wearing?: Pants, Incontinence brief     Lower body assist Assist for lower body dressing: Minimal Assistance - Patient > 75%     Toileting Toileting    Toileting assist Assist for toileting: Moderate Assistance - Patient 50 - 74%     Transfers Chair/bed transfer  Transfers assist     Chair/bed transfer assist level:  Minimal Assistance - Patient > 75% Chair/bed transfer assistive device: Programmer, multimedia   Ambulation assist   Ambulation activity did not occur: Safety/medical concerns  Assist level: 2 helpers Assistive device: Walker-rolling Max distance: 30'   Walk 10 feet activity   Assist  Walk 10 feet activity did not occur: Safety/medical concerns  Assist level: Minimal Assistance - Patient > 75% Assistive device: Walker-rolling   Walk 50 feet activity   Assist Walk 50 feet with 2 turns activity did not occur: Safety/medical concerns         Walk 150 feet activity   Assist Walk 150 feet activity did not occur: Safety/medical concerns         Walk 10 feet on uneven surface  activity   Assist Walk 10 feet on uneven surfaces activity did  not occur: Safety/medical concerns         Wheelchair     Assist Is the patient using a wheelchair?: Yes Type of Wheelchair: Manual Wheelchair activity did not occur: Safety/medical concerns  Wheelchair assist level: Supervision/Verbal cueing Max wheelchair distance: 100'    Wheelchair 50 feet with 2 turns activity    Assist    Wheelchair 50 feet with 2 turns activity did not occur: Safety/medical concerns   Assist Level: Supervision/Verbal cueing   Wheelchair 150 feet activity     Assist  Wheelchair 150 feet activity did not occur: Safety/medical concerns   Assist Level: Supervision/Verbal cueing   Blood pressure 114/62, pulse 81, temperature 97.8 F (36.6 C), resp. rate 16, height 5' 4"  (1.626 m), weight 60.1 kg, last menstrual period 04/20/2005, SpO2 98 %.  Medical Problem List and Plan: 1.  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             -patient may shower             -ELOS/Goals: 17-20 days/min a  10/12 d/c -Continue CIR therapies including PT, OT -it appears she has had medication discussion already with Dr. Dagoberto Ligas. I recommended that she discuss further with her at discharge next week about what can be discontinued at that time. Hopefully urecholine will fall off Monday  10/11- d/c tomorrow- con't PT and OT for last of training- team conference today to finalize d/c plans.   2.  Antithrombotics: -DVT/anticoagulation:  Pharmaceutical: Other (comment) --Eliquis             -antiplatelet therapy: N/A 3. Pain Management: Oxycodone  prn. Using every other day at this point             -reduce gabapentin to 330m qhs d/t dry mouth  9/30- will change Oxy to Norco 5/325 mg q4 hours prn since "too strong" sometimes- and makes her sleepy/slowed- so will see if helps.   10/4- norco prn- not taking much- con't regimen  10/8 order kpad for mid back pain  10/10- last used 10/4- won't send home on it,  per pt.  4. Mood: LCSW to follow for evaluation and support.              -antipsychotic agents: N/A 5. Neuropsych: This patient is capable of making decisions on her own behalf. 6. Skin/Wound Care: Routine pressure relief measures.  7. Fluids/Electrolytes/Nutrition: Monitor I/Os.  Does not like nutritional supplements.              -9/26 ate a little better this weekend, 9/24 labs ok  10/3- d/c'd [employer] per pt request-   -  KCL as below.  8. A flutter s/p DCCV: No in NSR on amiodarone and Eliquis             --Monitor HR/BP on TID basis  10/5- HR controlled- con't regimen             Monitor with increased activity 9. Hypokalemia: Continue K dur for supplement             CMP reviewed 9/24  9/27- K+ 3.7- will recheck 2x/week   10/3- pt wants to stop KCL, however is maintaining K+ of 3.8 on 60 mEq daily- so don't feel comfortable changing dose- will ask if she wants to change to pills?  10/4- change KCL to pill from packet but still need 60 mEq daily-   10/5- changed pills to noon per pt request- hopefully will help nausea  10/6- Nausea resolved with KCL pills  10/10- K+ up to 4.2- which is actually perfect- would have pt f/u with PCP to have labs checked in next 1-2 weeks after d/c to see if can lower KCL dose.   10/11- went over plan of labs in next 1-2 weeks with pt today- to see if can reduce KCL dose.  10. Neurogenic bladder: Monitor voiding with PVRs/bladder scans,             -9/26 still with urine retention, sporadic 50-1100cc -increase Urecholine to 12m bid    -reduce gabapentin  9/27- will restart Flomax 0.4 mg qsupper and monitor- didn't really get muchbefore went into Afib with RVR  10/3- increasing Flomax to 0.8 mg qsupper-    `0/5- voiding well- on higher dose of Flomax- is working!- bladder scans <115 cc- con't regimen  10/7-  decreased bethanachol to 25 mg QHS and if tolerated- will stop  Monday.   10/10- stop bethanachol-   10/11- no change- DON"T stop Flomax or  reduce dose before I see in clinic- explained to pt.  11. 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 9/29- LBM this AM- con't regimen   10/3- going regularly, per pt- con't reigmen 12. Hyponatremia: Continues to vary from 131-134.               9/26 133  10/3- Na 135- con't to monitor 13. N/V-   9/29- not constipated, but will con't Compazine prn.   10/5- nausea might be from Kcl-- will monitor  10/6- nausea resolved with KCL pills      LOS: 18 days A FACE TO FACE EVALUATION WAS PERFORMED  Angela Morales 04/07/2021, 10:11 AM

## 2021-04-07 NOTE — Progress Notes (Signed)
Occupational Therapy Session Note  Patient Details  Name: Angela Morales MRN: [id number] Date of Birth: 1946-02-03  Today's Date: 04/07/2021 OT Individual Time: 1000-1030 OT Individual Time Calculation (min): 30 min    Short Term Goals: Week 3:  OT Short Term Goal 1 (Week 3): STG=LTG 2/2 ELOS (continue working towards min A/supervision goals)  Skilled Therapeutic Interventions/Progress Updates:    Pt sitting up in w/c, no c/o pain.  Transported to ortho gym for time management.  Assessed BUE strength noting 3+/5 globally.  Provided HEP using min resist band to complete horizontal shoulder abduction/adduction, ER, triceps pull downs. Pt completed 1 x 10 reps.  Handout provided for increased carryover and reviewed with pt.  Returned to room, call bell and needs within reach.  Therapy Documentation Precautions:  Precautions Precautions: Fall Required Braces or Orthoses: Other Brace Other Brace: bilateral ankle air splints, PRAFOs Restrictions Weight Bearing Restrictions: No    Therapy/Group: Individual Therapy  Ezekiel Slocumb 04/07/2021, 9:19 AM

## 2021-04-07 NOTE — Progress Notes (Signed)
Occupational Therapy Session Note  Patient Details  Name: Angela Morales MRN: [id number] Date of Birth: 09/21/1945  Today's Date: 04/07/2021 OT Individual Time: 0700-0825 OT Individual Time Calculation (min): 85 min    Short Term Goals: Week 3:  OT Short Term Goal 1 (Week 3): STG=LTG 2/2 ELOS (continue working towards min A/supervision goals)  Skilled Therapeutic Interventions/Progress Updates:    OT intervention with focus on bathing at shower level and dressing with sit<>stand in [place]. Min A for bathing. Mod A for LB dressing. Pt completed UB dressing seated in w/c. Pt uses AE appropriately to assist with bathing/dressing tasks. SB transfers with supervision. Pt pleased with progress and ready for discharge home tomorrow. Pt verbalized understanding of recommendation for bathing/dressing bed level and w/c level at sink unitl such time as HHOT recommends otherwise. Pt remained in w/c with all needs within reach.   Therapy Documentation Precautions:  Precautions Precautions: Fall Required Braces or Orthoses: Other Brace Other Brace: bilateral ankle air splints, PRAFOs Restrictions Weight Bearing Restrictions: No Pain:  Pt denies pain this morning   Therapy/Group: Individual Therapy  Leroy Libman 04/07/2021, 8:29 AM

## 2021-04-08 MED ORDER — POTASSIUM CHLORIDE CRYS ER 20 MEQ PO TBCR: 60.0000 meq | EXTENDED_RELEASE_TABLET | Freq: Every day | ORAL | 0 refills | Status: DC

## 2021-04-08 MED ORDER — METHOCARBAMOL 500 MG PO TABS: 500.0000 mg | ORAL_TABLET | Freq: Three times a day (TID) | ORAL | 0 refills | Status: DC | PRN

## 2021-04-08 MED ORDER — AMIODARONE HCL 200 MG PO TABS: 200.0000 mg | ORAL_TABLET | Freq: Every day | ORAL | 0 refills | Status: DC

## 2021-04-08 MED ORDER — POLYETHYLENE GLYCOL 3350 17 G PO PACK: 17.0000 g | PACK | Freq: Every day | ORAL | 0 refills | Status: AC | PRN

## 2021-04-08 MED ORDER — TAMSULOSIN HCL 0.4 MG PO CAPS: 0.8000 mg | ORAL_CAPSULE | Freq: Every day | ORAL | 0 refills | Status: DC

## 2021-04-08 MED ORDER — PANTOPRAZOLE SODIUM 40 MG PO TBEC: 40.0000 mg | DELAYED_RELEASE_TABLET | Freq: Every day | ORAL | 0 refills | Status: DC

## 2021-04-08 MED ORDER — GABAPENTIN 300 MG PO CAPS: 300.0000 mg | ORAL_CAPSULE | Freq: Every day | ORAL | 0 refills | Status: DC

## 2021-04-08 MED ORDER — APIXABAN 5 MG PO TABS: 5.0000 mg | ORAL_TABLET | Freq: Two times a day (BID) | ORAL | 0 refills | Status: DC

## 2021-04-08 NOTE — Progress Notes (Signed)
Inpatient Rehabilitation Care Coordinator Discharge Note   Patient Details  Name: Angela Morales MRN: [id number] Date of Birth: 08/28/1945   Discharge location: D/c to home with support from husband, and private aide to begin on Monday 8am-12pm.  Length of Stay: 18 days  Discharge activity level:    Home/community participation: Limited  Patient response BX:UXYBFX Literacy - How often do you need to have someone help you when you read instructions, pamphlets, or other written material from your doctor or pharmacy?: Never  Patient response OV:ANVBTY Isolation - How often do you feel lonely or isolated from those around you?: Never  Services provided included: MD, RD, PT, OT, RN, CM, TR, Neuropsych, SW, Pharmacy  Financial Services:  Charity fundraiser Utilized: [place] [employer]  Choices offered to/list presented to: Yes  Follow-up services arranged:  [place], DME Home Health Agency: [facility]  for HHPT/OT    DME : [employer] [facility] for w/c, transfer board, and [place]  Patient response to transportation need: Is the patient able to respond to transportation needs?: Yes In the past 12 months, has lack of transportation kept you from medical appointments or from getting medications?: No In the past 12 months, has lack of transportation kept you from meetings, work, or from getting things needed for daily living?: No  Comments (or additional information):  Patient/Family verbalized understanding of follow-up arrangements:  Yes  Individual responsible for coordination of the follow-up plan: Contact pt [phone]  Confirmed correct DME delivered: Rana Snare 04/08/2021    Rana Snare

## 2021-04-08 NOTE — Progress Notes (Signed)
Discharge Inpatient Rehabilitation Medication Review by a Pharmacist  A complete drug regimen review was completed for this patient to identify any potential clinically significant medication issues.  High Risk Drug Classes Is patient taking? Indication by Medication  Antipsychotic No   Anticoagulant Yes Apixaban: VTE/CVA prevention due to Afib  Antibiotic No   Opioid No   Antiplatelet No   Hypoglycemics/insulin No   Vasoactive Medication No   Chemotherapy No   Other Yes Acetaminophen: PRN pain Amiodarone: arrhythmia treatment Gabapentin: neuropathy/pain Melatonin: insomnia Methocarbamol: muscle spams Pantoprazole: GERD treatment PEG: constipation prevention Potassium chloride: supplementation Senokot-S: constipation prevention Tamsulosin: urinary retention     Type of Medication Issue Identified Description of Issue Recommendation(s)  Drug Interaction(s) (clinically significant)     Duplicate Therapy     Allergy     No Medication Administration End Date     Incorrect Dose     Additional Drug Therapy Needed     Significant med changes from prior encounter (inform family/care partners about these prior to discharge).    Other       Clinically significant medication issues were identified that warrant physician communication and completion of prescribed/recommended actions by midnight of the next day:  No   Pharmacist comments: n/a  Time spent performing this drug regimen review (minutes):  20 minutes   Thank you for allowing pharmacy to be a part of this patient's care.  Ardyth Harps, PharmD Clinical Pharmacist

## 2021-04-08 NOTE — Discharge Summary (Signed)
Physician Discharge Summary  Patient ID: Angela Morales MRN: [id number] DOB/AGE: 02-Oct-1945 75 y.o.  Admit date: 03/20/2021 Discharge date: 04/08/2021  Discharge Diagnoses:  Principal Problem:   GBS (Guillain-Barre syndrome) ([place]) Active Problems:   Chronic anticoagulation   Hyponatremia   Neurogenic bladder   Discharged Condition: good  Significant Diagnostic Studies: No results found.  Labs:  Basic Metabolic Panel: BMP Latest Ref Rng & Units 04/06/2021 03/30/2021 03/23/2021  Glucose 70 - 99 mg/dL 92 87 109(H)  BUN 8 - 23 mg/dL 8 7(L) 7(L)  Creatinine 0.44 - 1.00 mg/dL 0.58 0.48 0.52  BUN/Creat Ratio 12 - 28 - - -  Sodium 135 - 145 mmol/L 134(L) 135 133(L)  Potassium 3.5 - 5.1 mmol/L 4.2 3.8 3.7  Chloride 98 - 111 mmol/L 101 101 98  CO2 22 - 32 mmol/L _0 Calcium 8.9 - 10.3 mg/dL 8.6(L) 8.9 8.9     CBC: CBC Latest Ref Rng & Units 04/06/2021 03/30/2021 03/23/2021  WBC 4.0 - 10.5 K/uL 6.3 6.6 6.8  Hemoglobin 12.0 - 15.0 g/dL 12.7 13.1 12.7  Hematocrit 36.0 - 46.0 % 38.6 39.7 39.7  Platelets 150 - 400 K/uL 223 270 235     CBG: No results for input(s): GLUCAP in the last 168 hours.  Brief HPI:   Angela Morales is a 75 y.o. female with history of A. 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  She was treated with IVIG but continued to worsen requiring intubation.  She was treated for aspiration pneumonia and required tracheostomy prior to extubation and eventually decannulation by 09/06.  Hospital course also significant for issues with A. fib with RVR, progressive hyponatremia as well as early acute cholecystitis with hypotension.    She was noted to have significant functional decline and was admitted to CIR on 03/04/2021.  She was making improvement with therapy however on 09/16 developed a flutter with hypotension requiring  discharged to acute hospital.  She was started on amiodarone drip but continued to have issues with a flutter requiring DC cardioversion by Dr. Acie Fredrickson on 09/19.  Heart rate was controlled and she remained in NSR post procedure.  Therapy was resumed and patient was noted to be limited by weakness, balance and postural deficits. CIR was recommended due to functional decline.    Hospital Course: Angela Morales was admitted to rehab 03/20/2021 for inpatient therapies to consist of PT and OT at least three hours five days a week. Past admission physiatrist, therapy team and rehab RN have worked together to provide customized collaborative inpatient rehab.  Her blood pressures were monitored on TID basis and has been controlled.  Serial check of electrolytes shows hypokalemia has resolved currently on 60 mEq a day.  Recommend repeat check of be met in 1 to 2 weeks to monitor for stability and discuss with PCP if dose can be decreased.  Pain/neuropathy has been reasonably controlled and gabapentin was decreased to 300 mg/HS to avoid sedative side effects.    Voiding was was monitored with PVR check and bladder scan.  She was noted to have intermittent issues with urinary retention and Urecholine was titrated upwards, Flomax was added and titrated up to 0.8 mg.  Urinary retention has resolved and Urecholine was tapered off without recurrence of retention.  Incontinence has resolved with discontinuation of laxatives and she is continent of bowel.  Serial check of electrolytes shows hyponatremia to be slowly improving.  Follow-up CBC shows H&H and platelets to be stable.  She has made steady gains during her rehab stay and currently requires min assist overall.  She will continue to receive follow-up home health PT and OT by [facility] after discharge.   Rehab course: During patient's stay in rehab weekly team conferences were held to monitor patient's progress, set goals and discuss barriers to discharge.  At admission, patient required max assist with mobility and total assist with basic ADL tasks. She  has had improvement in activity tolerance, balance, postural control as well as ability to compensate for deficits.  She requires min assist for bathing at bed or shower level.  She requires mod assist for lower body dressing and is able to perform upper body dressing tasks independently.  She requires supervision with sliding board transfers.  She is able to perform sit to supine and sit to stand transfers with mod assist.  He is able to perform lateral scoot transfers with set up assist for sliding board.  She has been able to ambulate 30 feet with min assist and rolling walker.  Family education has been completed regarding all aspects of safety and care.    Discharge disposition: 01-Home or Self Care  Diet:  Regular.   Special Instructions: Recommend repeat BMET in 1-2 week to follow up on [place].   Discharge Instructions     Ambulatory referral to Neurology   Complete by: As directed    An appointment is requested in approximately: 4 weeks--follow up on GBS   Ambulatory referral to Physical Medicine Rehab   Complete by: As directed    Hospital follow up      Allergies as of 04/08/2021       Reactions   Gluten Meal Other (See Comments)   celiac disease   Lactose Intolerance (gi) Other (See Comments)   Gas and bloating   Cefdinir Rash        Medication List     STOP taking these medications    albuterol (2.5 MG/3ML) 0.083% nebulizer solution Commonly known as: PROVENTIL   bethanechol 10 MG tablet Commonly known as: URECHOLINE   chlorhexidine 0.12 % solution Commonly known as: PERIDEX   Chlorhexidine Gluconate Cloth 2 % Pads   cyclobenzaprine 5 MG tablet Commonly known as: FLEXERIL   Gerhardt's butt cream Crea   guaiFENesin 100 MG/5ML Soln Commonly known as: ROBITUSSIN   lip balm Oint   oxyCODONE 5 MG immediate release tablet Commonly known as: Oxy  IR/ROXICODONE   potassium chloride 20 MEQ packet Commonly known as: KLOR-CON       TAKE these medications    acetaminophen 160 MG/5ML solution Commonly known as: TYLENOL Take 20.3 mLs (650 mg total) by mouth every 6 (six) hours as needed for mild pain, moderate pain or fever.   amiodarone 200 MG tablet Commonly known as: PACERONE Take 1 tablet (200 mg total) by mouth daily. What changed:  medication strength how much to take   apixaban 5 MG Tabs tablet Commonly known as: ELIQUIS Take 1 tablet (5 mg total) by mouth 2 (two) times daily.   gabapentin 300 MG capsule Commonly known as: NEURONTIN Take 1 capsule (300 mg total) by mouth at bedtime. What changed:  medication strength how much to take   latanoprost 0.005 % ophthalmic solution Commonly known as: XALATAN Place 1 drop into the left eye at bedtime.   melatonin 3 MG Tabs tablet  Take 1 tablet (3 mg total) by mouth at bedtime.   methocarbamol 500 MG tablet Commonly known as: ROBAXIN Take 1 tablet (500 mg total) by mouth every 8 (eight) hours as needed for muscle spasms.   pantoprazole 40 MG tablet Commonly known as: PROTONIX Take 1 tablet (40 mg total) by mouth daily.   polyethylene glycol 17 g packet Commonly known as: MIRALAX / GLYCOLAX Take 17 g by mouth daily as needed. What changed:  when to take this reasons to take this   polyvinyl alcohol 1.4 % ophthalmic solution Commonly known as: LIQUIFILM TEARS Place 1 drop into both eyes as needed for dry eyes.   potassium chloride SA 20 MEQ tablet Commonly known as: KLOR-CON Take 3 tablets (60 mEq total) by mouth daily.   senna-docusate 8.6-50 MG tablet Commonly known as: Senokot-S Take 1 tablet by mouth at bedtime as needed for mild constipation.   tamsulosin 0.4 MG Caps capsule Commonly known as: FLOMAX Take 2 capsules (0.8 mg total) by mouth daily after supper.   timolol 0.5 % ophthalmic solution Commonly known as: TIMOPTIC Place 1 drop into the  left eye 2 (two) times daily.   Zioptan 0.0015 % Soln Generic drug: Tafluprost (PF) Place 1 drop into the left eye at bedtime.        Follow-up Information     Lovorn, Jinny Blossom, MD Follow up.   Specialty: Physical Medicine and Rehabilitation Why: office will call you with follow up appointment Contact information: [address] [place] [address] [phone]         Deland Pretty, MD. Call in 1 day(s).   Specialty: Internal Medicine Why: for post hospital follow up Contact information: [place] [address] [phone]         Sanda Klein, MD .   Specialty: Cardiology Contact information: [address] [place] [address] [phone]         [facility] Follow up.   Why: will call you with follow up appointment Contact information: [address]     [place] [address] [phone]                Signed: Bary Leriche 04/09/2021, 5:08 PM

## 2021-04-08 NOTE — Progress Notes (Signed)
PROGRESS NOTE   Subjective/Complaints:  Pt reports has a L foot orthosis /tight sports orthosis.  Ready for d/c today- asking about f/u appts and meds- went over this with pt.   ROS:   Pt denies SOB, abd pain, CP, N/V/C/D, and vision changes  Objective:   No results found. Recent Labs    04/06/21 0633  WBC 6.3  HGB 12.7  HCT 38.6  PLT 223     Recent Labs    04/06/21 0633  NA 134*  K 4.2  CL 101  CO2 25  GLUCOSE 92  BUN 8  CREATININE 0.58  CALCIUM 8.6*      Intake/Output Summary (Last 24 hours) at 04/08/2021 0759 Last data filed at 04/07/2021 1825 Gross per 24 hour  Intake 300 ml  Output --  Net 300 ml        Physical Exam: Vital Signs Blood pressure (!) 109/55, pulse 81, temperature 97.7 F (36.5 C), resp. rate 14, height 5' 4"  (1.626 m), weight 60.1 kg, last menstrual period 04/20/2005, SpO2 98 %.     General: awake, alert, appropriate, laying supine in bed;  NAD HENT: conjugate gaze; oropharynx moist CV: regular rate; no JVD Pulmonary: CTA B/L; no W/R/R- good air movement GI: soft, NT, ND, (+)BS Psychiatric: appropriate Neurological: Ox3 Ext: no clubbing, cyanosis,   Psych: pleasant and cooperative  Neuro:  Alert and oriented x 3. Normal insight and awareness. Intact Memory. Normal language and speech. Cranial nerve exam unremarkable. UE 4+/5. LE: 4/5 prox to 3+ to 4-/5 distally. Stocking glove sensory loss below knees bilaterally. DTR's absent. Neuro stable. Musculoskeletal: Full ROM, No pain with AROM or PROM in the neck, trunk, or extremities. Posture appropriate   L foot- has rotation/internal of L foot at rest- peroneus?  Assessment/Plan: 1. Functional deficits which require 3+ hours per day of interdisciplinary therapy in a comprehensive inpatient rehab setting. Physiatrist is providing close team supervision and 24 hour management of active medical problems listed  below. Physiatrist and rehab team continue to assess barriers to discharge/monitor patient progress toward functional and medical goals  Care Tool:  Bathing    Body parts bathed by patient: Right arm, Left arm, Chest, Abdomen, Right upper leg, Left upper leg, Face, Front perineal area, Right lower leg, Left lower leg   Body parts bathed by helper: Buttocks Body parts n/a: Buttocks, Front perineal area   Bathing assist Assist Level: Minimal Assistance - Patient > 75%     Upper Body Dressing/Undressing Upper body dressing   What is the patient wearing?: Pull over shirt    Upper body assist Assist Level: Independent    Lower Body Dressing/Undressing Lower body dressing      What is the patient wearing?: Pants, Incontinence brief     Lower body assist Assist for lower body dressing: Minimal Assistance - Patient > 75%     Toileting Toileting    Toileting assist Assist for toileting: Moderate Assistance - Patient 50 - 74%     Transfers Chair/bed transfer  Transfers assist     Chair/bed transfer assist level: Set up assist Chair/bed transfer assistive device: Sliding board   Locomotion Ambulation   Ambulation  assist   Ambulation activity did not occur: Safety/medical concerns  Assist level: Minimal Assistance - Patient > 75% Assistive device: Walker-rolling Max distance: 30'   Walk 10 feet activity   Assist  Walk 10 feet activity did not occur: Safety/medical concerns  Assist level: Minimal Assistance - Patient > 75% Assistive device: Walker-rolling   Walk 50 feet activity   Assist Walk 50 feet with 2 turns activity did not occur: Safety/medical concerns         Walk 150 feet activity   Assist Walk 150 feet activity did not occur: Safety/medical concerns         Walk 10 feet on uneven surface  activity   Assist Walk 10 feet on uneven surfaces activity did not occur: Safety/medical concerns         Wheelchair     Assist Is  the patient using a wheelchair?: Yes Type of Wheelchair: Manual Wheelchair activity did not occur: Safety/medical concerns  Wheelchair assist level: Independent Max wheelchair distance: 150'    Wheelchair 50 feet with 2 turns activity    Assist    Wheelchair 50 feet with 2 turns activity did not occur: Safety/medical concerns   Assist Level: Independent   Wheelchair 150 feet activity     Assist  Wheelchair 150 feet activity did not occur: Safety/medical concerns   Assist Level: Independent   Blood pressure (!) 109/55, pulse 81, temperature 97.7 F (36.5 C), resp. rate 14, height 5' 4"  (1.626 m), weight 60.1 kg, last menstrual period 04/20/2005, SpO2 98 %.  Medical Problem List and Plan: 1.  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             -patient may shower             -ELOS/Goals: 17-20 days/min a  10/12 d/c -Continue CIR therapies including PT, OT -it appears she has had medication discussion already with Dr. Dagoberto Ligas. I recommended that she discuss further with her at discharge next week about what can be discontinued at that time. Hopefully urecholine will fall off Monday  10/11- d/c tomorrow- con't PT and OT for last of training- team conference today to finalize d/c plans.    10/12- d/c today- f/u with PCP; Dr Dagoberto Ligas and Neuro.  2.  Antithrombotics: -DVT/anticoagulation:  Pharmaceutical: Other (comment) --Eliquis             -antiplatelet therapy: N/A 3. Pain Management: Oxycodone  prn. Using every other day at this point             -reduce gabapentin to 360m qhs d/t dry mouth  9/30- will change Oxy to Norco 5/325 mg q4 hours prn since "too strong" sometimes- and makes her sleepy/slowed- so will see if helps.   10/4- norco prn- not taking much- con't regimen  10/8 order kpad for mid back pain  10/10- last used 10/4- won't send home on it, per pt.  4. Mood: LCSW to follow for evaluation and  support.              -antipsychotic agents: N/A 5. Neuropsych: This patient is capable of making decisions on her own behalf. 6. Skin/Wound Care: Routine pressure relief measures.  7. Fluids/Electrolytes/Nutrition: Monitor I/Os.  Does not like nutritional supplements.              -9/26 ate a little better this weekend, 9/24 labs ok  10/3- d/c'd [facility] per pt request-   -KCL as  below.  8. A flutter s/p DCCV: No in NSR on amiodarone and Eliquis             --Monitor HR/BP on TID basis  10/5- HR controlled- con't regimen             Monitor with increased activity 9. Hypokalemia: Continue K dur for supplement             CMP reviewed 9/24  9/27- K+ 3.7- will recheck 2x/week   10/3- pt wants to stop KCL, however is maintaining K+ of 3.8 on 60 mEq daily- so don't feel comfortable changing dose- will ask if she wants to change to pills?  10/4- change KCL to pill from packet but still need 60 mEq daily-   10/5- changed pills to noon per pt request- hopefully will help nausea  10/6- Nausea resolved with KCL pills  10/10- K+ up to 4.2- which is actually perfect- would have pt f/u with PCP to have labs checked in next 1-2 weeks after d/c to see if can lower KCL dose.   10/11- went over plan of labs in next 1-2 weeks with pt today- to see if can reduce KCL dose.  10. Neurogenic bladder: Monitor voiding with PVRs/bladder scans,             -9/26 still with urine retention, sporadic 50-1100cc -increase Urecholine to 47m bid    -reduce gabapentin  9/27- will restart Flomax 0.4 mg qsupper and monitor- didn't really get muchbefore went into Afib with RVR  10/3- increasing Flomax to 0.8 mg qsupper-    `0/5- voiding well- on higher dose of Flomax- is working!- bladder scans <115 cc- con't regimen  10/7-  decreased bethanachol to 25 mg QHS and if tolerated- will stop  Monday.   10/10- stop bethanachol-   10/11- no change- DON"T stop Flomax or reduce dose before I see in clinic- explained to pt.   11. 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 9/29- LBM this AM- con't regimen   10/3- going regularly, per pt- con't reigmen 12. Hyponatremia: Continues to vary from 131-134.               9/26 133  10/3- Na 135- con't to monitor 13. N/V-   9/29- not constipated, but will con't Compazine prn.   10/5- nausea might be from Kcl-- will monitor  10/6- nausea resolved with KCL pills      LOS: 19 days A FACE TO FACE EVALUATION WAS PERFORMED  Deirdre Gryder 04/08/2021, 7:59 AM

## 2021-04-09 DIAGNOSIS — N319 Neuromuscular dysfunction of bladder, unspecified: Secondary | ICD-10-CM

## 2021-04-13 ENCOUNTER — Telehealth: Payer: Self-pay

## 2021-04-13 DIAGNOSIS — M4126 Other idiopathic scoliosis, lumbar region: Secondary | ICD-10-CM | POA: Diagnosis not present

## 2021-04-13 DIAGNOSIS — H409 Unspecified glaucoma: Secondary | ICD-10-CM | POA: Diagnosis not present

## 2021-04-13 DIAGNOSIS — K52839 Microscopic colitis, unspecified: Secondary | ICD-10-CM | POA: Diagnosis not present

## 2021-04-13 DIAGNOSIS — G610 Guillain-Barre syndrome: Secondary | ICD-10-CM | POA: Diagnosis not present

## 2021-04-13 DIAGNOSIS — Z9181 History of falling: Secondary | ICD-10-CM | POA: Diagnosis not present

## 2021-04-13 DIAGNOSIS — K900 Celiac disease: Secondary | ICD-10-CM | POA: Diagnosis not present

## 2021-04-13 DIAGNOSIS — Z7901 Long term (current) use of anticoagulants: Secondary | ICD-10-CM | POA: Diagnosis not present

## 2021-04-13 DIAGNOSIS — N319 Neuromuscular dysfunction of bladder, unspecified: Secondary | ICD-10-CM | POA: Diagnosis not present

## 2021-04-13 DIAGNOSIS — G894 Chronic pain syndrome: Secondary | ICD-10-CM | POA: Diagnosis not present

## 2021-04-13 DIAGNOSIS — I4819 Other persistent atrial fibrillation: Secondary | ICD-10-CM | POA: Diagnosis not present

## 2021-04-13 DIAGNOSIS — M179 Osteoarthritis of knee, unspecified: Secondary | ICD-10-CM | POA: Diagnosis not present

## 2021-04-13 DIAGNOSIS — M519 Unspecified thoracic, thoracolumbar and lumbosacral intervertebral disc disorder: Secondary | ICD-10-CM | POA: Diagnosis not present

## 2021-04-13 DIAGNOSIS — E222 Syndrome of inappropriate secretion of antidiuretic hormone: Secondary | ICD-10-CM | POA: Diagnosis not present

## 2021-04-13 DIAGNOSIS — Z993 Dependence on wheelchair: Secondary | ICD-10-CM | POA: Diagnosis not present

## 2021-04-13 NOTE — Telephone Encounter (Signed)
Madelyn Flavors [facility] called to request PT POC for patient 2x/week for 5 weeks and 1x/week for 4 weeks. Verbal orders given

## 2021-04-14 ENCOUNTER — Other Ambulatory Visit: Payer: Self-pay | Admitting: Physical Medicine and Rehabilitation

## 2021-04-15 DIAGNOSIS — Z9181 History of falling: Secondary | ICD-10-CM | POA: Diagnosis not present

## 2021-04-15 DIAGNOSIS — M179 Osteoarthritis of knee, unspecified: Secondary | ICD-10-CM | POA: Diagnosis not present

## 2021-04-15 DIAGNOSIS — N319 Neuromuscular dysfunction of bladder, unspecified: Secondary | ICD-10-CM | POA: Diagnosis not present

## 2021-04-15 DIAGNOSIS — G610 Guillain-Barre syndrome: Secondary | ICD-10-CM | POA: Diagnosis not present

## 2021-04-15 DIAGNOSIS — K52839 Microscopic colitis, unspecified: Secondary | ICD-10-CM | POA: Diagnosis not present

## 2021-04-15 DIAGNOSIS — G894 Chronic pain syndrome: Secondary | ICD-10-CM | POA: Diagnosis not present

## 2021-04-15 DIAGNOSIS — E222 Syndrome of inappropriate secretion of antidiuretic hormone: Secondary | ICD-10-CM | POA: Diagnosis not present

## 2021-04-15 DIAGNOSIS — M4126 Other idiopathic scoliosis, lumbar region: Secondary | ICD-10-CM | POA: Diagnosis not present

## 2021-04-15 DIAGNOSIS — Z993 Dependence on wheelchair: Secondary | ICD-10-CM | POA: Diagnosis not present

## 2021-04-15 DIAGNOSIS — Z7901 Long term (current) use of anticoagulants: Secondary | ICD-10-CM | POA: Diagnosis not present

## 2021-04-15 DIAGNOSIS — H409 Unspecified glaucoma: Secondary | ICD-10-CM | POA: Diagnosis not present

## 2021-04-15 DIAGNOSIS — M519 Unspecified thoracic, thoracolumbar and lumbosacral intervertebral disc disorder: Secondary | ICD-10-CM | POA: Diagnosis not present

## 2021-04-15 DIAGNOSIS — I4819 Other persistent atrial fibrillation: Secondary | ICD-10-CM | POA: Diagnosis not present

## 2021-04-15 DIAGNOSIS — K900 Celiac disease: Secondary | ICD-10-CM | POA: Diagnosis not present

## 2021-04-16 ENCOUNTER — Telehealth: Payer: Self-pay

## 2021-04-16 DIAGNOSIS — M4126 Other idiopathic scoliosis, lumbar region: Secondary | ICD-10-CM | POA: Diagnosis not present

## 2021-04-16 DIAGNOSIS — I4819 Other persistent atrial fibrillation: Secondary | ICD-10-CM | POA: Diagnosis not present

## 2021-04-16 DIAGNOSIS — M179 Osteoarthritis of knee, unspecified: Secondary | ICD-10-CM | POA: Diagnosis not present

## 2021-04-16 DIAGNOSIS — K52839 Microscopic colitis, unspecified: Secondary | ICD-10-CM | POA: Diagnosis not present

## 2021-04-16 DIAGNOSIS — E222 Syndrome of inappropriate secretion of antidiuretic hormone: Secondary | ICD-10-CM | POA: Diagnosis not present

## 2021-04-16 DIAGNOSIS — K900 Celiac disease: Secondary | ICD-10-CM | POA: Diagnosis not present

## 2021-04-16 DIAGNOSIS — G894 Chronic pain syndrome: Secondary | ICD-10-CM | POA: Diagnosis not present

## 2021-04-16 DIAGNOSIS — Z7901 Long term (current) use of anticoagulants: Secondary | ICD-10-CM | POA: Diagnosis not present

## 2021-04-16 DIAGNOSIS — Z993 Dependence on wheelchair: Secondary | ICD-10-CM | POA: Diagnosis not present

## 2021-04-16 DIAGNOSIS — Z9181 History of falling: Secondary | ICD-10-CM | POA: Diagnosis not present

## 2021-04-16 DIAGNOSIS — H409 Unspecified glaucoma: Secondary | ICD-10-CM | POA: Diagnosis not present

## 2021-04-16 DIAGNOSIS — G610 Guillain-Barre syndrome: Secondary | ICD-10-CM | POA: Diagnosis not present

## 2021-04-16 DIAGNOSIS — M519 Unspecified thoracic, thoracolumbar and lumbosacral intervertebral disc disorder: Secondary | ICD-10-CM | POA: Diagnosis not present

## 2021-04-16 DIAGNOSIS — N319 Neuromuscular dysfunction of bladder, unspecified: Secondary | ICD-10-CM | POA: Diagnosis not present

## 2021-04-16 NOTE — Telephone Encounter (Signed)
Patient called stating she just got out of inpatient rehab and is having a lot of pain. She states the pain is so bad that she cannot sleep. She wants to know if something else can be called in because the Tylenol is not helping. Please advise

## 2021-04-20 ENCOUNTER — Telehealth: Payer: Self-pay

## 2021-04-20 MED ORDER — HYDROCODONE-ACETAMINOPHEN 5-325 MG PO TABS: 1.0000 | ORAL_TABLET | Freq: Three times a day (TID) | ORAL | 0 refills | Status: DC | PRN

## 2021-04-20 NOTE — Telephone Encounter (Signed)
I have sent in Rx for Norco- ML

## 2021-04-20 NOTE — Telephone Encounter (Signed)
Called [facility] to confirm medication was received and it was. Informed patient.

## 2021-04-20 NOTE — Telephone Encounter (Signed)
Patient stated she is experiencing leg swelling and wanted to know if Dr. Dagoberto Ligas knew what it could be. Spoke with Dr. Dagoberto Ligas and she stated she does not know and she would have to contact her PCP. Informed patient of information. Verbalized understanding

## 2021-04-20 NOTE — Telephone Encounter (Signed)
Patient states medication not sent to pharmacy. She needs it sent to [facility] on [address]

## 2021-04-20 NOTE — Telephone Encounter (Signed)
Informed patient of med sent to pharmacy

## 2021-04-20 NOTE — Addendum Note (Signed)
Addended by: Courtney Heys on: 04/20/2021 04:20 PM   Modules accepted: Orders

## 2021-04-21 DIAGNOSIS — G610 Guillain-Barre syndrome: Secondary | ICD-10-CM | POA: Diagnosis not present

## 2021-04-21 DIAGNOSIS — R600 Localized edema: Secondary | ICD-10-CM | POA: Diagnosis not present

## 2021-04-21 DIAGNOSIS — I480 Paroxysmal atrial fibrillation: Secondary | ICD-10-CM | POA: Diagnosis not present

## 2021-04-21 DIAGNOSIS — Z7901 Long term (current) use of anticoagulants: Secondary | ICD-10-CM | POA: Diagnosis not present

## 2021-04-22 DIAGNOSIS — M179 Osteoarthritis of knee, unspecified: Secondary | ICD-10-CM | POA: Diagnosis not present

## 2021-04-22 DIAGNOSIS — G894 Chronic pain syndrome: Secondary | ICD-10-CM | POA: Diagnosis not present

## 2021-04-22 DIAGNOSIS — N319 Neuromuscular dysfunction of bladder, unspecified: Secondary | ICD-10-CM | POA: Diagnosis not present

## 2021-04-22 DIAGNOSIS — E222 Syndrome of inappropriate secretion of antidiuretic hormone: Secondary | ICD-10-CM | POA: Diagnosis not present

## 2021-04-22 DIAGNOSIS — G610 Guillain-Barre syndrome: Secondary | ICD-10-CM | POA: Diagnosis not present

## 2021-04-22 DIAGNOSIS — K52839 Microscopic colitis, unspecified: Secondary | ICD-10-CM | POA: Diagnosis not present

## 2021-04-22 DIAGNOSIS — Z993 Dependence on wheelchair: Secondary | ICD-10-CM | POA: Diagnosis not present

## 2021-04-22 DIAGNOSIS — M4126 Other idiopathic scoliosis, lumbar region: Secondary | ICD-10-CM | POA: Diagnosis not present

## 2021-04-22 DIAGNOSIS — Z7901 Long term (current) use of anticoagulants: Secondary | ICD-10-CM | POA: Diagnosis not present

## 2021-04-22 DIAGNOSIS — H409 Unspecified glaucoma: Secondary | ICD-10-CM | POA: Diagnosis not present

## 2021-04-22 DIAGNOSIS — I4819 Other persistent atrial fibrillation: Secondary | ICD-10-CM | POA: Diagnosis not present

## 2021-04-22 DIAGNOSIS — M519 Unspecified thoracic, thoracolumbar and lumbosacral intervertebral disc disorder: Secondary | ICD-10-CM | POA: Diagnosis not present

## 2021-04-22 DIAGNOSIS — Z9181 History of falling: Secondary | ICD-10-CM | POA: Diagnosis not present

## 2021-04-22 DIAGNOSIS — K900 Celiac disease: Secondary | ICD-10-CM | POA: Diagnosis not present

## 2021-04-23 DIAGNOSIS — H409 Unspecified glaucoma: Secondary | ICD-10-CM | POA: Diagnosis not present

## 2021-04-23 DIAGNOSIS — Z7901 Long term (current) use of anticoagulants: Secondary | ICD-10-CM | POA: Diagnosis not present

## 2021-04-23 DIAGNOSIS — Z993 Dependence on wheelchair: Secondary | ICD-10-CM | POA: Diagnosis not present

## 2021-04-23 DIAGNOSIS — K52839 Microscopic colitis, unspecified: Secondary | ICD-10-CM | POA: Diagnosis not present

## 2021-04-23 DIAGNOSIS — G894 Chronic pain syndrome: Secondary | ICD-10-CM | POA: Diagnosis not present

## 2021-04-23 DIAGNOSIS — N319 Neuromuscular dysfunction of bladder, unspecified: Secondary | ICD-10-CM | POA: Diagnosis not present

## 2021-04-23 DIAGNOSIS — M519 Unspecified thoracic, thoracolumbar and lumbosacral intervertebral disc disorder: Secondary | ICD-10-CM | POA: Diagnosis not present

## 2021-04-23 DIAGNOSIS — K900 Celiac disease: Secondary | ICD-10-CM | POA: Diagnosis not present

## 2021-04-23 DIAGNOSIS — E222 Syndrome of inappropriate secretion of antidiuretic hormone: Secondary | ICD-10-CM | POA: Diagnosis not present

## 2021-04-23 DIAGNOSIS — M179 Osteoarthritis of knee, unspecified: Secondary | ICD-10-CM | POA: Diagnosis not present

## 2021-04-23 DIAGNOSIS — M4126 Other idiopathic scoliosis, lumbar region: Secondary | ICD-10-CM | POA: Diagnosis not present

## 2021-04-23 DIAGNOSIS — G610 Guillain-Barre syndrome: Secondary | ICD-10-CM | POA: Diagnosis not present

## 2021-04-23 DIAGNOSIS — I4819 Other persistent atrial fibrillation: Secondary | ICD-10-CM | POA: Diagnosis not present

## 2021-04-23 DIAGNOSIS — Z9181 History of falling: Secondary | ICD-10-CM | POA: Diagnosis not present

## 2021-04-24 DIAGNOSIS — M179 Osteoarthritis of knee, unspecified: Secondary | ICD-10-CM | POA: Diagnosis not present

## 2021-04-24 DIAGNOSIS — H409 Unspecified glaucoma: Secondary | ICD-10-CM | POA: Diagnosis not present

## 2021-04-24 DIAGNOSIS — E222 Syndrome of inappropriate secretion of antidiuretic hormone: Secondary | ICD-10-CM | POA: Diagnosis not present

## 2021-04-24 DIAGNOSIS — M519 Unspecified thoracic, thoracolumbar and lumbosacral intervertebral disc disorder: Secondary | ICD-10-CM | POA: Diagnosis not present

## 2021-04-24 DIAGNOSIS — Z9181 History of falling: Secondary | ICD-10-CM | POA: Diagnosis not present

## 2021-04-24 DIAGNOSIS — Z7901 Long term (current) use of anticoagulants: Secondary | ICD-10-CM | POA: Diagnosis not present

## 2021-04-24 DIAGNOSIS — G610 Guillain-Barre syndrome: Secondary | ICD-10-CM | POA: Diagnosis not present

## 2021-04-24 DIAGNOSIS — K52839 Microscopic colitis, unspecified: Secondary | ICD-10-CM | POA: Diagnosis not present

## 2021-04-24 DIAGNOSIS — Z993 Dependence on wheelchair: Secondary | ICD-10-CM | POA: Diagnosis not present

## 2021-04-24 DIAGNOSIS — N319 Neuromuscular dysfunction of bladder, unspecified: Secondary | ICD-10-CM | POA: Diagnosis not present

## 2021-04-24 DIAGNOSIS — M4126 Other idiopathic scoliosis, lumbar region: Secondary | ICD-10-CM | POA: Diagnosis not present

## 2021-04-24 DIAGNOSIS — G894 Chronic pain syndrome: Secondary | ICD-10-CM | POA: Diagnosis not present

## 2021-04-24 DIAGNOSIS — K900 Celiac disease: Secondary | ICD-10-CM | POA: Diagnosis not present

## 2021-04-24 DIAGNOSIS — I4819 Other persistent atrial fibrillation: Secondary | ICD-10-CM | POA: Diagnosis not present

## 2021-04-28 ENCOUNTER — Ambulatory Visit: Payer: Medicare Other | Admitting: Cardiology

## 2021-04-28 ENCOUNTER — Encounter: Payer: Self-pay | Admitting: Cardiology

## 2021-04-28 ENCOUNTER — Other Ambulatory Visit: Payer: Self-pay

## 2021-04-28 VITALS — BP 102/60 | HR 86 | Ht 63.0 in | Wt 130.0 lb

## 2021-04-28 DIAGNOSIS — E222 Syndrome of inappropriate secretion of antidiuretic hormone: Secondary | ICD-10-CM | POA: Diagnosis not present

## 2021-04-28 DIAGNOSIS — M519 Unspecified thoracic, thoracolumbar and lumbosacral intervertebral disc disorder: Secondary | ICD-10-CM | POA: Diagnosis not present

## 2021-04-28 DIAGNOSIS — G894 Chronic pain syndrome: Secondary | ICD-10-CM | POA: Diagnosis not present

## 2021-04-28 DIAGNOSIS — K52839 Microscopic colitis, unspecified: Secondary | ICD-10-CM | POA: Diagnosis not present

## 2021-04-28 DIAGNOSIS — Z9181 History of falling: Secondary | ICD-10-CM | POA: Diagnosis not present

## 2021-04-28 DIAGNOSIS — Z993 Dependence on wheelchair: Secondary | ICD-10-CM | POA: Diagnosis not present

## 2021-04-28 DIAGNOSIS — I4819 Other persistent atrial fibrillation: Secondary | ICD-10-CM | POA: Diagnosis not present

## 2021-04-28 DIAGNOSIS — G610 Guillain-Barre syndrome: Secondary | ICD-10-CM | POA: Diagnosis not present

## 2021-04-28 DIAGNOSIS — H409 Unspecified glaucoma: Secondary | ICD-10-CM | POA: Diagnosis not present

## 2021-04-28 DIAGNOSIS — M179 Osteoarthritis of knee, unspecified: Secondary | ICD-10-CM | POA: Diagnosis not present

## 2021-04-28 DIAGNOSIS — N319 Neuromuscular dysfunction of bladder, unspecified: Secondary | ICD-10-CM | POA: Diagnosis not present

## 2021-04-28 DIAGNOSIS — K900 Celiac disease: Secondary | ICD-10-CM | POA: Diagnosis not present

## 2021-04-28 DIAGNOSIS — Z7901 Long term (current) use of anticoagulants: Secondary | ICD-10-CM | POA: Diagnosis not present

## 2021-04-28 DIAGNOSIS — M4126 Other idiopathic scoliosis, lumbar region: Secondary | ICD-10-CM | POA: Diagnosis not present

## 2021-04-28 MED ORDER — AMIODARONE HCL 200 MG PO TABS: 100.0000 mg | ORAL_TABLET | Freq: Every day | ORAL | 2 refills | Status: DC

## 2021-04-28 NOTE — Patient Instructions (Signed)
Medication Instructions:  Your physician has recommended you make the following change in your medication:  DECREASE Amiodarone to 100 mg daily  *If you need a refill on your cardiac medications before your next appointment, please call your pharmacy*   Lab Work: None ordered   Testing/Procedures: None ordered   Follow-Up: At [facility], you and your health needs are our priority.  As part of our continuing mission to provide you with exceptional heart care, we have created designated Provider Care Teams.  These Care Teams include your primary Cardiologist (physician) and Advanced Practice Providers (APPs -  Physician Assistants and Nurse Practitioners) who all work together to provide you with the care you need, when you need it.  Your next appointment:   3 month(s)  The format for your next appointment:   In Person  Provider:   Allegra Lai, MD    Thank you for choosing [facility]!!   Trinidad Curet, RN [phone]

## 2021-04-28 NOTE — Addendum Note (Signed)
Addended by: Stanton Kidney on: 04/28/2021 10:44 AM   Modules accepted: Orders

## 2021-04-28 NOTE — Progress Notes (Signed)
Electrophysiology Office Note   Date:  04/28/2021   ID:  Angela Morales, DOB Jan 30, 1946, MRN [id number]  PCP:  Deland Pretty, MD  Cardiologist:  [place] Primary Electrophysiologist:  Heidy Mccubbin Meredith Leeds, MD    Chief Complaint: AF   History of Present Illness: Angela Morales is a 75 y.o. female who is being seen today for the evaluation of AF at the request of Deland Pretty, MD. Presenting today for electrophysiology evaluation.  She has a history significant for celiac disease, glaucoma, and atrial fibrillation.  She was diagnosed with atrial fibrillation in 2018 in the setting of a GI illness that was later diagnosed with celiac disease she developed persistent atrial fibrillation underwent cardioversion which was unfortunately unsuccessful.  She is now status post atrial fibrillation ablation 01/28/2021.  A week after her atrial fibrillation ablation, she presented to the hospital with progressive paralysis.  She was diagnosed with Guillain-Barr syndrome.  She had a prolonged hospitalization with rehab.  She presented to the hospital from rehab and atrial flutter.  She is now status post cardioversion.  Today, denies symptoms of palpitations, chest pain, shortness of breath, orthopnea, PND, lower extremity edema, claudication, dizziness, presyncope, syncope, bleeding, or neurologic sequela. The patient is tolerating medications without difficulties.  She has not had any further palpitations since her cardioversion for atrial flutter.  She is continuing to get stronger.  She is able to stand and pivot to get out of a car.  Unfortunately, she has some lower extremity swelling that is not pitting.  She was also found to be hypothyroid and is now on Synthroid.   Past Medical History:  Diagnosis Date   Abnormal uterine bleeding    on HRT   Allergy    seasonal   Arthritis    Atrial fibrillation (HCC)    Celiac disease    DDD (degenerative disc disease)    Dysrhythmia    Glaucoma     Microscopic colitis    Osteoarthritis of both knees    PONV (postoperative nausea and vomiting)    SVT (supraventricular tachycardia) ([place])    Past Surgical History:  Procedure Laterality Date   APPENDECTOMY     ATRIAL FIBRILLATION ABLATION N/A 01/28/2021   Procedure: ATRIAL FIBRILLATION ABLATION;  Surgeon: Constance Haw, MD;  Location: [place] [facility];  Service: Cardiovascular;  Laterality: N/A;   CARDIOVERSION N/A 10/30/2020   Procedure: CARDIOVERSION;  Surgeon: Jerline Pain, MD;  Location: [facility] ENDOSCOPY;  Service: Cardiovascular;  Laterality: N/A;   CARDIOVERSION N/A 03/16/2021   Procedure: CARDIOVERSION;  Surgeon: Thayer Headings, MD;  Location: [facility] ENDOSCOPY;  Service: Cardiovascular;  Laterality: N/A;   COLONOSCOPY     FOOT FRACTURE SURGERY  10/2013   TONSILLECTOMY AND ADENOIDECTOMY     TOTAL KNEE ARTHROPLASTY Left 03/22/2016   Procedure: TOTAL KNEE ARTHROPLASTY;  Surgeon: Dorna Leitz, MD;  Location: [place];  Service: Orthopedics;  Laterality: Left;   TUBAL LIGATION  1981   VAGINAL HYSTERECTOMY  04/20/05   prolapse, adenomyosis     Current Outpatient Medications  Medication Sig Dispense Refill   acetaminophen (TYLENOL) 160 MG/5ML solution Take 20.3 mLs (650 mg total) by mouth every 6 (six) hours as needed for mild pain, moderate pain or fever. 120 mL 0   amiodarone (PACERONE) 200 MG tablet Take 1 tablet (200 mg total) by mouth daily. 30 tablet 0   apixaban (ELIQUIS) 5 MG TABS tablet Take 1 tablet (5 mg total) by mouth 2 (two) times daily. [place]  tablet 0   gabapentin (NEURONTIN) 300 MG capsule Take 1 capsule (300 mg total) by mouth at bedtime. 30 capsule 0   HYDROcodone-acetaminophen (NORCO) 5-325 MG tablet Take 1 tablet by mouth 3 (three) times daily as needed for moderate pain. 90 tablet 0   latanoprost (XALATAN) 0.005 % ophthalmic solution Place 1 drop into the left eye at bedtime. 2.5 mL 12   levothyroxine (SYNTHROID) 50 MCG tablet Take 50 mcg by mouth daily before  breakfast.     melatonin 3 MG TABS tablet Take 1 tablet (3 mg total) by mouth at bedtime.  0   methocarbamol (ROBAXIN) 500 MG tablet Take 1 tablet (500 mg total) by mouth every 8 (eight) hours as needed for muscle spasms. 90 tablet 0   pantoprazole (PROTONIX) 40 MG tablet Take 1 tablet (40 mg total) by mouth daily. 30 tablet 0   polyethylene glycol (MIRALAX / GLYCOLAX) 17 g packet Take 17 g by mouth daily as needed. 14 each 0   polyvinyl alcohol (LIQUIFILM TEARS) 1.4 % ophthalmic solution Place 1 drop into both eyes as needed for dry eyes. 15 mL 0   potassium chloride SA (KLOR-CON) 20 MEQ tablet Take 3 tablets (60 mEq total) by mouth daily. 90 tablet 0   senna-docusate (SENOKOT-S) 8.6-50 MG tablet Take 1 tablet by mouth at bedtime as needed for mild constipation.     tamsulosin (FLOMAX) 0.4 MG CAPS capsule Take 2 capsules (0.8 mg total) by mouth daily after supper. 60 capsule 0   timolol (TIMOPTIC) 0.5 % ophthalmic solution Place 1 drop into the left eye 2 (two) times daily. 10 mL    ZIOPTAN 0.0015 % SOLN Place 1 drop into the left eye at bedtime.     No current facility-administered medications for this visit.    Allergies:   Gluten meal, Lactose intolerance (gi), and Cefdinir   Social History:  The patient  reports that she has never smoked. She has never used smokeless tobacco. She reports current alcohol use of about 4.0 - 6.0 standard drinks per week. She reports that she does not use drugs.   Family History:  The patient's family history includes Bipolar disorder in her son; Breast cancer in her cousin; Heart disease (age of onset: 65) in her brother; Liver disease in her son; Osteoarthritis in her maternal grandmother; Osteoporosis in her mother; Prostate cancer in her father; Stroke in her mother.   ROS:  Please see the history of present illness.   Otherwise, review of systems is positive for none.   All other systems are reviewed and negative.   PHYSICAL EXAM: VS:  BP 102/60    Pulse 86   Ht 5' 3"  (1.6 m)   Wt 130 lb (59 kg)   LMP 04/20/2005   SpO2 96%   BMI 23.03 kg/m  , BMI Body mass index is 23.03 kg/m. GEN: Well nourished, well developed, in no acute distress  HEENT: normal  Neck: no JVD, carotid bruits, or masses Cardiac: RRR; no murmurs, rubs, or gallops,no edema  Respiratory:  clear to auscultation bilaterally, normal work of breathing GI: soft, nontender, nondistended, + BS MS: no deformity or atrophy  Skin: warm and dry Neuro:  Strength and sensation are intact Psych: euthymic mood, full affect  EKG:  EKG is ordered today. Personal review of the ekg ordered shows sinus rhythm, rate 86  Recent Labs: 02/07/2021: TSH 1.770 02/18/2021: B Natriuretic Peptide 560.1 03/13/2021: Magnesium 2.0 03/21/2021: ALT 17 04/06/2021: BUN 8; Creatinine, Ser 0.58;  Hemoglobin 12.7; Platelets 223; Potassium 4.2; Sodium 134    Lipid Panel     Component Value Date/Time   TRIG 92 02/13/2021 0316     Wt Readings from Last 3 Encounters:  04/28/21 130 lb (59 kg)  03/27/21 132 lb 7.9 oz (60.1 kg)  03/16/21 141 lb 1.5 oz (64 kg)      Other studies Reviewed: Additional studies/ records that were reviewed today include: TTE 12/05/20  Review of the above records today demonstrates:   1. Left ventricular ejection fraction, by estimation, is 55%. The left  ventricle has normal function. The left ventricle has no regional wall  motion abnormalities. Left ventricular diastolic parameters were grossly  normal.   2. Right ventricular systolic function is normal. The right ventricular  size is mildly enlarged. There is normal pulmonary artery systolic  pressure. The estimated right ventricular systolic pressure is 39.6 mmHg.   3. Left atrial size was moderately dilated.   4. Right atrial size was mild to moderately dilated.   5. The mitral valve is normal in structure. Mild mitral valve  regurgitation. No evidence of mitral stenosis.   6. Tricuspid valve regurgitation  is mild to moderate.   7. The aortic valve is grossly normal. There is mild calcification of the  aortic valve. Aortic valve regurgitation is not visualized. No aortic  stenosis is present.   8. The inferior vena cava is normal in size with greater than 50%  respiratory variability, suggesting right atrial pressure of 3 mmHg.    ASSESSMENT AND PLAN:  1.  Persistent atrial fibrillation/flutter: Currently on Eliquis and metoprolol.  CHA2DS2-VASc of 3.  Status post ablation 01/28/2021.  She has had both typical and atypical flutters.  She is status post cardioversion.  She is currently on amiodarone 200 mg daily.  High risk medication monitoring.  If she is remained in sinus rhythm, Angela Morales reduce to 100 mg a day.   Current medicines are reviewed at length with the patient today.   The patient does not have concerns regarding her medicines.  The following changes were made today: Reduce amiodarone  Labs/ tests ordered today include:  Orders Placed This Encounter  Procedures   EKG 12-Lead      Disposition:   FU with Angela Morales 3 months  Signed, Rhylan Gross Meredith Leeds, MD  04/28/2021 10:40 AM     [address] [place] [address] [phone] (office) [phone] (fax)

## 2021-04-29 DIAGNOSIS — E222 Syndrome of inappropriate secretion of antidiuretic hormone: Secondary | ICD-10-CM | POA: Diagnosis not present

## 2021-04-29 DIAGNOSIS — M4126 Other idiopathic scoliosis, lumbar region: Secondary | ICD-10-CM | POA: Diagnosis not present

## 2021-04-29 DIAGNOSIS — M519 Unspecified thoracic, thoracolumbar and lumbosacral intervertebral disc disorder: Secondary | ICD-10-CM | POA: Diagnosis not present

## 2021-04-29 DIAGNOSIS — N319 Neuromuscular dysfunction of bladder, unspecified: Secondary | ICD-10-CM | POA: Diagnosis not present

## 2021-04-29 DIAGNOSIS — H409 Unspecified glaucoma: Secondary | ICD-10-CM | POA: Diagnosis not present

## 2021-04-29 DIAGNOSIS — Z993 Dependence on wheelchair: Secondary | ICD-10-CM | POA: Diagnosis not present

## 2021-04-29 DIAGNOSIS — I4819 Other persistent atrial fibrillation: Secondary | ICD-10-CM | POA: Diagnosis not present

## 2021-04-29 DIAGNOSIS — Z9181 History of falling: Secondary | ICD-10-CM | POA: Diagnosis not present

## 2021-04-29 DIAGNOSIS — K900 Celiac disease: Secondary | ICD-10-CM | POA: Diagnosis not present

## 2021-04-29 DIAGNOSIS — Z7901 Long term (current) use of anticoagulants: Secondary | ICD-10-CM | POA: Diagnosis not present

## 2021-04-29 DIAGNOSIS — G894 Chronic pain syndrome: Secondary | ICD-10-CM | POA: Diagnosis not present

## 2021-04-29 DIAGNOSIS — K52839 Microscopic colitis, unspecified: Secondary | ICD-10-CM | POA: Diagnosis not present

## 2021-04-29 DIAGNOSIS — M179 Osteoarthritis of knee, unspecified: Secondary | ICD-10-CM | POA: Diagnosis not present

## 2021-04-29 DIAGNOSIS — G610 Guillain-Barre syndrome: Secondary | ICD-10-CM | POA: Diagnosis not present

## 2021-04-30 ENCOUNTER — Other Ambulatory Visit: Payer: Self-pay | Admitting: Physical Medicine and Rehabilitation

## 2021-05-01 DIAGNOSIS — K52839 Microscopic colitis, unspecified: Secondary | ICD-10-CM | POA: Diagnosis not present

## 2021-05-01 DIAGNOSIS — M4126 Other idiopathic scoliosis, lumbar region: Secondary | ICD-10-CM | POA: Diagnosis not present

## 2021-05-01 DIAGNOSIS — N319 Neuromuscular dysfunction of bladder, unspecified: Secondary | ICD-10-CM | POA: Diagnosis not present

## 2021-05-01 DIAGNOSIS — G894 Chronic pain syndrome: Secondary | ICD-10-CM | POA: Diagnosis not present

## 2021-05-01 DIAGNOSIS — I4819 Other persistent atrial fibrillation: Secondary | ICD-10-CM | POA: Diagnosis not present

## 2021-05-01 DIAGNOSIS — Z993 Dependence on wheelchair: Secondary | ICD-10-CM | POA: Diagnosis not present

## 2021-05-01 DIAGNOSIS — E222 Syndrome of inappropriate secretion of antidiuretic hormone: Secondary | ICD-10-CM | POA: Diagnosis not present

## 2021-05-01 DIAGNOSIS — Z7901 Long term (current) use of anticoagulants: Secondary | ICD-10-CM | POA: Diagnosis not present

## 2021-05-01 DIAGNOSIS — H409 Unspecified glaucoma: Secondary | ICD-10-CM | POA: Diagnosis not present

## 2021-05-01 DIAGNOSIS — Z9181 History of falling: Secondary | ICD-10-CM | POA: Diagnosis not present

## 2021-05-01 DIAGNOSIS — K900 Celiac disease: Secondary | ICD-10-CM | POA: Diagnosis not present

## 2021-05-01 DIAGNOSIS — M179 Osteoarthritis of knee, unspecified: Secondary | ICD-10-CM | POA: Diagnosis not present

## 2021-05-01 DIAGNOSIS — M519 Unspecified thoracic, thoracolumbar and lumbosacral intervertebral disc disorder: Secondary | ICD-10-CM | POA: Diagnosis not present

## 2021-05-01 DIAGNOSIS — G610 Guillain-Barre syndrome: Secondary | ICD-10-CM | POA: Diagnosis not present

## 2021-05-04 ENCOUNTER — Other Ambulatory Visit: Payer: Self-pay | Admitting: Physical Medicine and Rehabilitation

## 2021-05-04 ENCOUNTER — Telehealth: Payer: Self-pay

## 2021-05-04 DIAGNOSIS — G894 Chronic pain syndrome: Secondary | ICD-10-CM | POA: Diagnosis not present

## 2021-05-04 DIAGNOSIS — Z9181 History of falling: Secondary | ICD-10-CM | POA: Diagnosis not present

## 2021-05-04 DIAGNOSIS — N319 Neuromuscular dysfunction of bladder, unspecified: Secondary | ICD-10-CM | POA: Diagnosis not present

## 2021-05-04 DIAGNOSIS — I4819 Other persistent atrial fibrillation: Secondary | ICD-10-CM | POA: Diagnosis not present

## 2021-05-04 DIAGNOSIS — Z7901 Long term (current) use of anticoagulants: Secondary | ICD-10-CM | POA: Diagnosis not present

## 2021-05-04 DIAGNOSIS — E222 Syndrome of inappropriate secretion of antidiuretic hormone: Secondary | ICD-10-CM | POA: Diagnosis not present

## 2021-05-04 DIAGNOSIS — H409 Unspecified glaucoma: Secondary | ICD-10-CM | POA: Diagnosis not present

## 2021-05-04 DIAGNOSIS — K900 Celiac disease: Secondary | ICD-10-CM | POA: Diagnosis not present

## 2021-05-04 DIAGNOSIS — M4126 Other idiopathic scoliosis, lumbar region: Secondary | ICD-10-CM | POA: Diagnosis not present

## 2021-05-04 DIAGNOSIS — M179 Osteoarthritis of knee, unspecified: Secondary | ICD-10-CM | POA: Diagnosis not present

## 2021-05-04 DIAGNOSIS — K52839 Microscopic colitis, unspecified: Secondary | ICD-10-CM | POA: Diagnosis not present

## 2021-05-04 DIAGNOSIS — Z993 Dependence on wheelchair: Secondary | ICD-10-CM | POA: Diagnosis not present

## 2021-05-04 DIAGNOSIS — G610 Guillain-Barre syndrome: Secondary | ICD-10-CM | POA: Diagnosis not present

## 2021-05-04 DIAGNOSIS — M519 Unspecified thoracic, thoracolumbar and lumbosacral intervertebral disc disorder: Secondary | ICD-10-CM | POA: Diagnosis not present

## 2021-05-04 MED ORDER — GABAPENTIN 300 MG PO CAPS: ORAL_CAPSULE | ORAL | 5 refills | Status: DC

## 2021-05-04 NOTE — Telephone Encounter (Signed)
Patient called and stated PT and OT recommended she get a referral for orthotics for her shoe or ankle to help her walk better. Please advise

## 2021-05-04 NOTE — Telephone Encounter (Signed)
Needs orthosis for B/L feet for gait - wrote Rx from [facility]- will mail to pt- also sent in refill of gabapentin 300 mg QHS for nerve pain-  #5 refills

## 2021-05-04 NOTE — Telephone Encounter (Signed)
Prescription put in outgoing mail to send to patient

## 2021-05-06 DIAGNOSIS — G894 Chronic pain syndrome: Secondary | ICD-10-CM | POA: Diagnosis not present

## 2021-05-06 DIAGNOSIS — H409 Unspecified glaucoma: Secondary | ICD-10-CM | POA: Diagnosis not present

## 2021-05-06 DIAGNOSIS — M179 Osteoarthritis of knee, unspecified: Secondary | ICD-10-CM | POA: Diagnosis not present

## 2021-05-06 DIAGNOSIS — G610 Guillain-Barre syndrome: Secondary | ICD-10-CM | POA: Diagnosis not present

## 2021-05-06 DIAGNOSIS — Z993 Dependence on wheelchair: Secondary | ICD-10-CM | POA: Diagnosis not present

## 2021-05-06 DIAGNOSIS — M519 Unspecified thoracic, thoracolumbar and lumbosacral intervertebral disc disorder: Secondary | ICD-10-CM | POA: Diagnosis not present

## 2021-05-06 DIAGNOSIS — Z9181 History of falling: Secondary | ICD-10-CM | POA: Diagnosis not present

## 2021-05-06 DIAGNOSIS — K52839 Microscopic colitis, unspecified: Secondary | ICD-10-CM | POA: Diagnosis not present

## 2021-05-06 DIAGNOSIS — Z7901 Long term (current) use of anticoagulants: Secondary | ICD-10-CM | POA: Diagnosis not present

## 2021-05-06 DIAGNOSIS — N319 Neuromuscular dysfunction of bladder, unspecified: Secondary | ICD-10-CM | POA: Diagnosis not present

## 2021-05-06 DIAGNOSIS — I4819 Other persistent atrial fibrillation: Secondary | ICD-10-CM | POA: Diagnosis not present

## 2021-05-06 DIAGNOSIS — E222 Syndrome of inappropriate secretion of antidiuretic hormone: Secondary | ICD-10-CM | POA: Diagnosis not present

## 2021-05-06 DIAGNOSIS — K900 Celiac disease: Secondary | ICD-10-CM | POA: Diagnosis not present

## 2021-05-06 DIAGNOSIS — M4126 Other idiopathic scoliosis, lumbar region: Secondary | ICD-10-CM | POA: Diagnosis not present

## 2021-05-07 DIAGNOSIS — M4126 Other idiopathic scoliosis, lumbar region: Secondary | ICD-10-CM | POA: Diagnosis not present

## 2021-05-07 DIAGNOSIS — G894 Chronic pain syndrome: Secondary | ICD-10-CM | POA: Diagnosis not present

## 2021-05-07 DIAGNOSIS — Z7901 Long term (current) use of anticoagulants: Secondary | ICD-10-CM | POA: Diagnosis not present

## 2021-05-07 DIAGNOSIS — H409 Unspecified glaucoma: Secondary | ICD-10-CM | POA: Diagnosis not present

## 2021-05-07 DIAGNOSIS — M179 Osteoarthritis of knee, unspecified: Secondary | ICD-10-CM | POA: Diagnosis not present

## 2021-05-07 DIAGNOSIS — Z9181 History of falling: Secondary | ICD-10-CM | POA: Diagnosis not present

## 2021-05-07 DIAGNOSIS — G610 Guillain-Barre syndrome: Secondary | ICD-10-CM | POA: Diagnosis not present

## 2021-05-07 DIAGNOSIS — K900 Celiac disease: Secondary | ICD-10-CM | POA: Diagnosis not present

## 2021-05-07 DIAGNOSIS — K52839 Microscopic colitis, unspecified: Secondary | ICD-10-CM | POA: Diagnosis not present

## 2021-05-07 DIAGNOSIS — E222 Syndrome of inappropriate secretion of antidiuretic hormone: Secondary | ICD-10-CM | POA: Diagnosis not present

## 2021-05-07 DIAGNOSIS — M519 Unspecified thoracic, thoracolumbar and lumbosacral intervertebral disc disorder: Secondary | ICD-10-CM | POA: Diagnosis not present

## 2021-05-07 DIAGNOSIS — Z993 Dependence on wheelchair: Secondary | ICD-10-CM | POA: Diagnosis not present

## 2021-05-07 DIAGNOSIS — N319 Neuromuscular dysfunction of bladder, unspecified: Secondary | ICD-10-CM | POA: Diagnosis not present

## 2021-05-07 DIAGNOSIS — I4819 Other persistent atrial fibrillation: Secondary | ICD-10-CM | POA: Diagnosis not present

## 2021-05-08 DIAGNOSIS — E222 Syndrome of inappropriate secretion of antidiuretic hormone: Secondary | ICD-10-CM | POA: Diagnosis not present

## 2021-05-08 DIAGNOSIS — Z7901 Long term (current) use of anticoagulants: Secondary | ICD-10-CM | POA: Diagnosis not present

## 2021-05-08 DIAGNOSIS — G894 Chronic pain syndrome: Secondary | ICD-10-CM | POA: Diagnosis not present

## 2021-05-08 DIAGNOSIS — K900 Celiac disease: Secondary | ICD-10-CM | POA: Diagnosis not present

## 2021-05-08 DIAGNOSIS — N319 Neuromuscular dysfunction of bladder, unspecified: Secondary | ICD-10-CM | POA: Diagnosis not present

## 2021-05-08 DIAGNOSIS — K52839 Microscopic colitis, unspecified: Secondary | ICD-10-CM | POA: Diagnosis not present

## 2021-05-08 DIAGNOSIS — M519 Unspecified thoracic, thoracolumbar and lumbosacral intervertebral disc disorder: Secondary | ICD-10-CM | POA: Diagnosis not present

## 2021-05-08 DIAGNOSIS — M179 Osteoarthritis of knee, unspecified: Secondary | ICD-10-CM | POA: Diagnosis not present

## 2021-05-08 DIAGNOSIS — H409 Unspecified glaucoma: Secondary | ICD-10-CM | POA: Diagnosis not present

## 2021-05-08 DIAGNOSIS — I4819 Other persistent atrial fibrillation: Secondary | ICD-10-CM | POA: Diagnosis not present

## 2021-05-08 DIAGNOSIS — M4126 Other idiopathic scoliosis, lumbar region: Secondary | ICD-10-CM | POA: Diagnosis not present

## 2021-05-08 DIAGNOSIS — Z993 Dependence on wheelchair: Secondary | ICD-10-CM | POA: Diagnosis not present

## 2021-05-08 DIAGNOSIS — Z9181 History of falling: Secondary | ICD-10-CM | POA: Diagnosis not present

## 2021-05-08 DIAGNOSIS — G610 Guillain-Barre syndrome: Secondary | ICD-10-CM | POA: Diagnosis not present

## 2021-05-11 DIAGNOSIS — N319 Neuromuscular dysfunction of bladder, unspecified: Secondary | ICD-10-CM | POA: Diagnosis not present

## 2021-05-11 DIAGNOSIS — E222 Syndrome of inappropriate secretion of antidiuretic hormone: Secondary | ICD-10-CM | POA: Diagnosis not present

## 2021-05-11 DIAGNOSIS — G610 Guillain-Barre syndrome: Secondary | ICD-10-CM | POA: Diagnosis not present

## 2021-05-11 DIAGNOSIS — I4819 Other persistent atrial fibrillation: Secondary | ICD-10-CM | POA: Diagnosis not present

## 2021-05-11 DIAGNOSIS — M179 Osteoarthritis of knee, unspecified: Secondary | ICD-10-CM | POA: Diagnosis not present

## 2021-05-11 DIAGNOSIS — G894 Chronic pain syndrome: Secondary | ICD-10-CM | POA: Diagnosis not present

## 2021-05-11 DIAGNOSIS — Z9181 History of falling: Secondary | ICD-10-CM | POA: Diagnosis not present

## 2021-05-11 DIAGNOSIS — M4126 Other idiopathic scoliosis, lumbar region: Secondary | ICD-10-CM | POA: Diagnosis not present

## 2021-05-11 DIAGNOSIS — K900 Celiac disease: Secondary | ICD-10-CM | POA: Diagnosis not present

## 2021-05-11 DIAGNOSIS — Z7901 Long term (current) use of anticoagulants: Secondary | ICD-10-CM | POA: Diagnosis not present

## 2021-05-11 DIAGNOSIS — K52839 Microscopic colitis, unspecified: Secondary | ICD-10-CM | POA: Diagnosis not present

## 2021-05-11 DIAGNOSIS — H409 Unspecified glaucoma: Secondary | ICD-10-CM | POA: Diagnosis not present

## 2021-05-11 DIAGNOSIS — Z993 Dependence on wheelchair: Secondary | ICD-10-CM | POA: Diagnosis not present

## 2021-05-11 DIAGNOSIS — M519 Unspecified thoracic, thoracolumbar and lumbosacral intervertebral disc disorder: Secondary | ICD-10-CM | POA: Diagnosis not present

## 2021-05-13 ENCOUNTER — Other Ambulatory Visit: Payer: Self-pay | Admitting: Physical Medicine and Rehabilitation

## 2021-05-14 DIAGNOSIS — K52839 Microscopic colitis, unspecified: Secondary | ICD-10-CM | POA: Diagnosis not present

## 2021-05-14 DIAGNOSIS — K900 Celiac disease: Secondary | ICD-10-CM | POA: Diagnosis not present

## 2021-05-14 DIAGNOSIS — Z993 Dependence on wheelchair: Secondary | ICD-10-CM | POA: Diagnosis not present

## 2021-05-14 DIAGNOSIS — Z9181 History of falling: Secondary | ICD-10-CM | POA: Diagnosis not present

## 2021-05-14 DIAGNOSIS — G610 Guillain-Barre syndrome: Secondary | ICD-10-CM | POA: Diagnosis not present

## 2021-05-14 DIAGNOSIS — G894 Chronic pain syndrome: Secondary | ICD-10-CM | POA: Diagnosis not present

## 2021-05-14 DIAGNOSIS — E222 Syndrome of inappropriate secretion of antidiuretic hormone: Secondary | ICD-10-CM | POA: Diagnosis not present

## 2021-05-14 DIAGNOSIS — Z7901 Long term (current) use of anticoagulants: Secondary | ICD-10-CM | POA: Diagnosis not present

## 2021-05-14 DIAGNOSIS — H409 Unspecified glaucoma: Secondary | ICD-10-CM | POA: Diagnosis not present

## 2021-05-14 DIAGNOSIS — M519 Unspecified thoracic, thoracolumbar and lumbosacral intervertebral disc disorder: Secondary | ICD-10-CM | POA: Diagnosis not present

## 2021-05-14 DIAGNOSIS — I4819 Other persistent atrial fibrillation: Secondary | ICD-10-CM | POA: Diagnosis not present

## 2021-05-14 DIAGNOSIS — M179 Osteoarthritis of knee, unspecified: Secondary | ICD-10-CM | POA: Diagnosis not present

## 2021-05-14 DIAGNOSIS — M4126 Other idiopathic scoliosis, lumbar region: Secondary | ICD-10-CM | POA: Diagnosis not present

## 2021-05-14 DIAGNOSIS — N319 Neuromuscular dysfunction of bladder, unspecified: Secondary | ICD-10-CM | POA: Diagnosis not present

## 2021-05-15 ENCOUNTER — Other Ambulatory Visit: Payer: Self-pay

## 2021-05-15 ENCOUNTER — Encounter
Payer: Medicare Other | Attending: Physical Medicine and Rehabilitation | Admitting: Physical Medicine and Rehabilitation

## 2021-05-15 ENCOUNTER — Encounter: Payer: Self-pay | Admitting: Physical Medicine and Rehabilitation

## 2021-05-15 VITALS — BP 125/76 | HR 78 | Ht 63.0 in

## 2021-05-15 DIAGNOSIS — Z993 Dependence on wheelchair: Secondary | ICD-10-CM | POA: Insufficient documentation

## 2021-05-15 DIAGNOSIS — G610 Guillain-Barre syndrome: Secondary | ICD-10-CM | POA: Diagnosis not present

## 2021-05-15 DIAGNOSIS — R339 Retention of urine, unspecified: Secondary | ICD-10-CM | POA: Diagnosis not present

## 2021-05-15 DIAGNOSIS — G894 Chronic pain syndrome: Secondary | ICD-10-CM | POA: Insufficient documentation

## 2021-05-15 DIAGNOSIS — Z79891 Long term (current) use of opiate analgesic: Secondary | ICD-10-CM | POA: Diagnosis not present

## 2021-05-15 DIAGNOSIS — Z5181 Encounter for therapeutic drug level monitoring: Secondary | ICD-10-CM | POA: Insufficient documentation

## 2021-05-15 NOTE — Progress Notes (Signed)
Subjective:    Patient ID: Angela Morales, female    DOB: 1945-09-25, 75 y.o.   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   Sees [facility] 12/1 for measuring for for B/L AFO's- hasn't been measured yet- using ankle splints B/L    Still has the hip/back pain-  Legs are still swelling- will lay down in afternoon and elevate to level of heart-  at least 1-2 hours- does helps. Also loosens air casts, which helps as well.   No issues with bladder- peeing well- no retention base don Sx's. Still on Flomax 0.8 mg q supper.  Therapy- PT and OT- H/H coming 2x/week- PT to decrease to 1x/week and OT to stay at 2x/week-  Probably can get to outpt PT if orders that in future.   Bowels- regular BM's now- LBM this AM.  Pain- still taking Gabapentin 300 mg QHS for pain.  Also taking Norco- takes usually at bedtime- due to increased pain- difficulty getting to sleep. Just taking at night- still has plenty.     Pain Inventory Average Pain 3 Pain Right Now 4 My pain is intermittent, dull, tingling, and aching  In the last 24 hours, has pain interfered with the following? General activity 3 Relation with others 0 Enjoyment of life 0 What TIME of day is your pain at its worst? evening and night Sleep (in general) Fair  Pain is worse with: bending, standing, and some activites Pain improves with: pacing activities and medication Relief from Meds: 5  walk with assistance use a walker ability to climb steps?  no do you drive?  no use a wheelchair needs help with transfers  retired I need assistance with the following:  feeding, dressing, bathing, toileting, meal prep, household duties, and  shopping  weakness numbness tingling trouble walking spasms  Any changes since last visit?  no  Any changes since last visit?  no    Family History  Problem Relation Age of Onset   Osteoporosis Mother    Stroke Mother        mini   Prostate cancer Father    Heart disease Brother 34       shunt put in heart   Osteoarthritis Maternal Grandmother    Bipolar disorder Son    Liver disease Son    Breast cancer Cousin        1st cousin   Colon cancer Neg Hx    Social History   Socioeconomic History   Marital status: Married    Spouse name: Not on file   Number of children: 3   Years of education: Not on file   Highest education level: Not on file  Occupational History   Occupation: retired  Tobacco Use   Smoking status: Never   Smokeless tobacco: Never  Vaping Use   Vaping Use: Never used  Substance and Sexual Activity   Alcohol use: Yes    Alcohol/week: 4.0 - 6.0 standard drinks    Types: 4 - 6 Glasses of wine per week    Comment: occasional   Drug use: No   Sexual activity: Yes    Partners: Male    Birth control/protection: Surgical    Comment: TV[facility]  Other Topics Concern   Not on file  Social History Narrative   Not on file   Social Determinants of Health   Financial Resource Strain: Not on file  Food Insecurity: Not on file  Transportation Needs: Not on file  Physical Activity: Not on file  Stress: Not on file  Social Connections: Not on file   Past Surgical History:  Procedure Laterality Date   APPENDECTOMY     ATRIAL FIBRILLATION ABLATION N/A 01/28/2021   Procedure: [place];  Surgeon: Constance Haw, MD;  Location: [place] CV LAB;  Service: Cardiovascular;  Laterality: N/A;   CARDIOVERSION N/A 10/30/2020   Procedure: CARDIOVERSION;  Surgeon: Jerline Pain, MD;  Location: [facility] ENDOSCOPY;  Service: Cardiovascular;  Laterality: N/A;   CARDIOVERSION N/A 03/16/2021   Procedure: CARDIOVERSION;  Surgeon: Thayer Headings, MD;   Location: [facility] ENDOSCOPY;  Service: Cardiovascular;  Laterality: N/A;   COLONOSCOPY     FOOT FRACTURE SURGERY  10/2013   TONSILLECTOMY AND ADENOIDECTOMY     TOTAL KNEE ARTHROPLASTY Left 03/22/2016   Procedure: TOTAL KNEE ARTHROPLASTY;  Surgeon: Dorna Leitz, MD;  Location: [place];  Service: Orthopedics;  Laterality: Left;   TUBAL LIGATION  1981   VAGINAL HYSTERECTOMY  04/20/05   prolapse, adenomyosis   Past Medical History:  Diagnosis Date   Abnormal uterine bleeding    on HRT   Allergy    seasonal   Arthritis    Atrial fibrillation (HCC)    Celiac disease    DDD (degenerative disc disease)    Dysrhythmia    Glaucoma    Microscopic colitis    Osteoarthritis of both knees    PONV (postoperative nausea and vomiting)    SVT (supraventricular tachycardia) (HCC)    Ht 5' 3"  (1.6 m)   LMP 04/20/2005   BMI 23.03 kg/m   Opioid Risk Score:   Fall Risk Score:  `1  Depression screen PHQ 2/9  Depression screen PHQ 2/9 05/15/2021  Decreased Interest 0  Down, Depressed, Hopeless 0  PHQ - 2 Score 0  Altered sleeping 2  Tired, decreased energy 1  Change in appetite 0  Feeling bad or failure about yourself  1  Trouble concentrating 0  Moving slowly or fidgety/restless 0  Suicidal thoughts 0  PHQ-9 Score 4  Difficult doing work/chores Not difficult at all     Review of Systems  Constitutional: Negative.   HENT: Negative.    Eyes: Negative.   Respiratory: Negative.    Cardiovascular: Negative.   Gastrointestinal: Negative.   Endocrine: Negative.   Genitourinary: Negative.   Musculoskeletal:  Positive for gait problem.  Skin: Negative.   Allergic/Immunologic: Negative.   Neurological:  Positive for weakness and numbness.  Hematological: Negative.   Psychiatric/Behavioral: Negative.        Objective:   Physical Exam Awake, alert, appropriate, flat affect; accompanied by her aide, Iris, NAD In manual w/c MS: UE 5/5 in all muscles B/L  RLE- HF 3-/5; KE 4-/5; DF 2+/5; PF  4-/5 LLE- HF 2-/5; KE 4-/5 DF 2-/5 and PF 3/5   Neuro:  Normal sensation to light touch in Ues and fingers B/L  Pins and needles/decreased from knees on down B/L   2-3+ LE edema in calves/ankles- less so in feet- less pitting than expected.  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   To call me, when H/H feels you are about ready to discharged- and I will send you for outpt therapies.  I need to know ahead of time to reduce delay in between.   2. Will reduce Flomax to 0.4/1 tablet nightly x 2 weeks- if you start having symptoms of retention- peeing q30 minutes, etc- then will go back to previous dose- - after 2 weeks, can stop but again watch for symptoms of retention.   3. Con't gabapentin 300 mg nightly for pain.   4. Con't Norco/ 5/325 mg as needed for pain- needs opiate contract and UDS today.    5. Off Robaxin/Methocarbamol  6. Has Neurology appointment in January 2023.    7. Seeing [facility] for B/L AFO's Dec 1st for fitting.    8. Other meds per PCP and Cardiologist.   9. F/U in 3 months  10.  10. Can wear "fuzzy boots"- during day- for a few hours- don't want you to lose range in ankles.   I spent a total of 22 minutes on total care/visit- as detailed above- esp discussing AFOs.

## 2021-05-15 NOTE — Patient Instructions (Addendum)
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   To call me, when H/H feels you are about ready to discharged- and I will send you for outpt therapies.  I need to know ahead of time to reduce delay in between.   2. Will reduce Flomax to 0.4/1 tablet nightly x 2 weeks- if you start having symptoms of retention- peeing q30 minutes, etc- then will go back to previous dose- - after 2 weeks, can stop but again watch for symptoms of retention.   3. Con't gabapentin 300 mg nightly for pain.   4. Con't Norco/ 5/325 mg as needed for pain- needs opiate contract and UDS today.    5. Off Robaxin/Methocarbemol  6. Has Neurology appointment in January 2023.    7. Seeing [facility] for B/L AFO's Dec 1st for fitting.    8. Other meds per PCP and Cardiologist.   9. F/U in 3 months  10. Can wear "fuzzy boots"- during day- for a few hours- don't want you to lose range in ankles.

## 2021-05-18 ENCOUNTER — Other Ambulatory Visit: Payer: Self-pay | Admitting: Physical Medicine and Rehabilitation

## 2021-05-18 DIAGNOSIS — K52839 Microscopic colitis, unspecified: Secondary | ICD-10-CM | POA: Diagnosis not present

## 2021-05-18 DIAGNOSIS — G894 Chronic pain syndrome: Secondary | ICD-10-CM | POA: Diagnosis not present

## 2021-05-18 DIAGNOSIS — K900 Celiac disease: Secondary | ICD-10-CM | POA: Diagnosis not present

## 2021-05-18 DIAGNOSIS — Z9181 History of falling: Secondary | ICD-10-CM | POA: Diagnosis not present

## 2021-05-18 DIAGNOSIS — M179 Osteoarthritis of knee, unspecified: Secondary | ICD-10-CM | POA: Diagnosis not present

## 2021-05-18 DIAGNOSIS — I4819 Other persistent atrial fibrillation: Secondary | ICD-10-CM | POA: Diagnosis not present

## 2021-05-18 DIAGNOSIS — M4126 Other idiopathic scoliosis, lumbar region: Secondary | ICD-10-CM | POA: Diagnosis not present

## 2021-05-18 DIAGNOSIS — Z993 Dependence on wheelchair: Secondary | ICD-10-CM | POA: Diagnosis not present

## 2021-05-18 DIAGNOSIS — E222 Syndrome of inappropriate secretion of antidiuretic hormone: Secondary | ICD-10-CM | POA: Diagnosis not present

## 2021-05-18 DIAGNOSIS — N319 Neuromuscular dysfunction of bladder, unspecified: Secondary | ICD-10-CM | POA: Diagnosis not present

## 2021-05-18 DIAGNOSIS — Z7901 Long term (current) use of anticoagulants: Secondary | ICD-10-CM | POA: Diagnosis not present

## 2021-05-18 DIAGNOSIS — M519 Unspecified thoracic, thoracolumbar and lumbosacral intervertebral disc disorder: Secondary | ICD-10-CM | POA: Diagnosis not present

## 2021-05-18 DIAGNOSIS — H409 Unspecified glaucoma: Secondary | ICD-10-CM | POA: Diagnosis not present

## 2021-05-18 DIAGNOSIS — G610 Guillain-Barre syndrome: Secondary | ICD-10-CM | POA: Diagnosis not present

## 2021-05-22 DIAGNOSIS — K52839 Microscopic colitis, unspecified: Secondary | ICD-10-CM | POA: Diagnosis not present

## 2021-05-22 DIAGNOSIS — M519 Unspecified thoracic, thoracolumbar and lumbosacral intervertebral disc disorder: Secondary | ICD-10-CM | POA: Diagnosis not present

## 2021-05-22 DIAGNOSIS — M4126 Other idiopathic scoliosis, lumbar region: Secondary | ICD-10-CM | POA: Diagnosis not present

## 2021-05-22 DIAGNOSIS — I4819 Other persistent atrial fibrillation: Secondary | ICD-10-CM | POA: Diagnosis not present

## 2021-05-22 DIAGNOSIS — N319 Neuromuscular dysfunction of bladder, unspecified: Secondary | ICD-10-CM | POA: Diagnosis not present

## 2021-05-22 DIAGNOSIS — G894 Chronic pain syndrome: Secondary | ICD-10-CM | POA: Diagnosis not present

## 2021-05-22 DIAGNOSIS — M179 Osteoarthritis of knee, unspecified: Secondary | ICD-10-CM | POA: Diagnosis not present

## 2021-05-22 DIAGNOSIS — Z9181 History of falling: Secondary | ICD-10-CM | POA: Diagnosis not present

## 2021-05-22 DIAGNOSIS — Z993 Dependence on wheelchair: Secondary | ICD-10-CM | POA: Diagnosis not present

## 2021-05-22 DIAGNOSIS — K900 Celiac disease: Secondary | ICD-10-CM | POA: Diagnosis not present

## 2021-05-22 DIAGNOSIS — E222 Syndrome of inappropriate secretion of antidiuretic hormone: Secondary | ICD-10-CM | POA: Diagnosis not present

## 2021-05-22 DIAGNOSIS — H409 Unspecified glaucoma: Secondary | ICD-10-CM | POA: Diagnosis not present

## 2021-05-22 DIAGNOSIS — G610 Guillain-Barre syndrome: Secondary | ICD-10-CM | POA: Diagnosis not present

## 2021-05-22 DIAGNOSIS — Z7901 Long term (current) use of anticoagulants: Secondary | ICD-10-CM | POA: Diagnosis not present

## 2021-05-22 LAB — DRUG TOX MONITOR 1 W/CONF, ORAL FLD
Amphetamines: NEGATIVE ng/mL (ref <10)
Barbiturates: NEGATIVE ng/mL (ref <10)
Benzodiazepines: NEGATIVE ng/mL (ref <0.50)
Buprenorphine: NEGATIVE ng/mL (ref <0.10)
Cocaine: NEGATIVE ng/mL (ref <5.0)
Codeine: NEGATIVE ng/mL (ref <2.5)
Dihydrocodeine: 3.8 ng/mL — ABNORMAL HIGH (ref <2.5)
Fentanyl: NEGATIVE ng/mL (ref <0.10)
Heroin Metabolite: NEGATIVE ng/mL (ref <1.0)
Hydrocodone: 28.4 ng/mL — ABNORMAL HIGH (ref <2.5)
Hydromorphone: NEGATIVE ng/mL (ref <2.5)
MARIJUANA: NEGATIVE ng/mL (ref <2.5)
MDMA: NEGATIVE ng/mL (ref <10)
Meprobamate: NEGATIVE ng/mL (ref <2.5)
Methadone: NEGATIVE ng/mL (ref <5.0)
Morphine: NEGATIVE ng/mL (ref <2.5)
Nicotine Metabolite: NEGATIVE ng/mL (ref <5.0)
Norhydrocodone: NEGATIVE ng/mL (ref <2.5)
Noroxycodone: NEGATIVE ng/mL (ref <2.5)
Opiates: POSITIVE ng/mL — AB (ref <2.5)
Oxycodone: NEGATIVE ng/mL (ref <2.5)
Oxymorphone: NEGATIVE ng/mL (ref <2.5)
Phencyclidine: NEGATIVE ng/mL (ref <10)
Tapentadol: NEGATIVE ng/mL (ref <5.0)
Tramadol: NEGATIVE ng/mL (ref <5.0)
Zolpidem: NEGATIVE ng/mL (ref <5.0)

## 2021-05-22 LAB — DRUG TOX ALC METAB W/CON, ORAL FLD: Alcohol Metabolite: NEGATIVE ng/mL (ref <25)

## 2021-05-25 ENCOUNTER — Other Ambulatory Visit: Payer: Self-pay | Admitting: Physical Medicine and Rehabilitation

## 2021-05-27 DIAGNOSIS — G610 Guillain-Barre syndrome: Secondary | ICD-10-CM | POA: Diagnosis not present

## 2021-05-27 DIAGNOSIS — E222 Syndrome of inappropriate secretion of antidiuretic hormone: Secondary | ICD-10-CM | POA: Diagnosis not present

## 2021-05-27 DIAGNOSIS — M519 Unspecified thoracic, thoracolumbar and lumbosacral intervertebral disc disorder: Secondary | ICD-10-CM | POA: Diagnosis not present

## 2021-05-27 DIAGNOSIS — Z9181 History of falling: Secondary | ICD-10-CM | POA: Diagnosis not present

## 2021-05-27 DIAGNOSIS — H409 Unspecified glaucoma: Secondary | ICD-10-CM | POA: Diagnosis not present

## 2021-05-27 DIAGNOSIS — G894 Chronic pain syndrome: Secondary | ICD-10-CM | POA: Diagnosis not present

## 2021-05-27 DIAGNOSIS — M4126 Other idiopathic scoliosis, lumbar region: Secondary | ICD-10-CM | POA: Diagnosis not present

## 2021-05-27 DIAGNOSIS — K900 Celiac disease: Secondary | ICD-10-CM | POA: Diagnosis not present

## 2021-05-27 DIAGNOSIS — Z993 Dependence on wheelchair: Secondary | ICD-10-CM | POA: Diagnosis not present

## 2021-05-27 DIAGNOSIS — N319 Neuromuscular dysfunction of bladder, unspecified: Secondary | ICD-10-CM | POA: Diagnosis not present

## 2021-05-27 DIAGNOSIS — Z7901 Long term (current) use of anticoagulants: Secondary | ICD-10-CM | POA: Diagnosis not present

## 2021-05-27 DIAGNOSIS — I4819 Other persistent atrial fibrillation: Secondary | ICD-10-CM | POA: Diagnosis not present

## 2021-05-27 DIAGNOSIS — K52839 Microscopic colitis, unspecified: Secondary | ICD-10-CM | POA: Diagnosis not present

## 2021-05-27 DIAGNOSIS — M179 Osteoarthritis of knee, unspecified: Secondary | ICD-10-CM | POA: Diagnosis not present

## 2021-05-29 ENCOUNTER — Other Ambulatory Visit: Payer: Self-pay | Admitting: Physical Medicine and Rehabilitation

## 2021-06-02 DIAGNOSIS — M179 Osteoarthritis of knee, unspecified: Secondary | ICD-10-CM | POA: Diagnosis not present

## 2021-06-02 DIAGNOSIS — Z9181 History of falling: Secondary | ICD-10-CM | POA: Diagnosis not present

## 2021-06-02 DIAGNOSIS — M519 Unspecified thoracic, thoracolumbar and lumbosacral intervertebral disc disorder: Secondary | ICD-10-CM | POA: Diagnosis not present

## 2021-06-02 DIAGNOSIS — K52839 Microscopic colitis, unspecified: Secondary | ICD-10-CM | POA: Diagnosis not present

## 2021-06-02 DIAGNOSIS — G894 Chronic pain syndrome: Secondary | ICD-10-CM | POA: Diagnosis not present

## 2021-06-02 DIAGNOSIS — M4126 Other idiopathic scoliosis, lumbar region: Secondary | ICD-10-CM | POA: Diagnosis not present

## 2021-06-02 DIAGNOSIS — I4819 Other persistent atrial fibrillation: Secondary | ICD-10-CM | POA: Diagnosis not present

## 2021-06-02 DIAGNOSIS — G610 Guillain-Barre syndrome: Secondary | ICD-10-CM | POA: Diagnosis not present

## 2021-06-02 DIAGNOSIS — Z993 Dependence on wheelchair: Secondary | ICD-10-CM | POA: Diagnosis not present

## 2021-06-02 DIAGNOSIS — H40053 Ocular hypertension, bilateral: Secondary | ICD-10-CM | POA: Diagnosis not present

## 2021-06-02 DIAGNOSIS — N319 Neuromuscular dysfunction of bladder, unspecified: Secondary | ICD-10-CM | POA: Diagnosis not present

## 2021-06-02 DIAGNOSIS — K900 Celiac disease: Secondary | ICD-10-CM | POA: Diagnosis not present

## 2021-06-02 DIAGNOSIS — Z7901 Long term (current) use of anticoagulants: Secondary | ICD-10-CM | POA: Diagnosis not present

## 2021-06-02 DIAGNOSIS — E222 Syndrome of inappropriate secretion of antidiuretic hormone: Secondary | ICD-10-CM | POA: Diagnosis not present

## 2021-06-02 DIAGNOSIS — H409 Unspecified glaucoma: Secondary | ICD-10-CM | POA: Diagnosis not present

## 2021-06-03 DIAGNOSIS — M519 Unspecified thoracic, thoracolumbar and lumbosacral intervertebral disc disorder: Secondary | ICD-10-CM

## 2021-06-03 DIAGNOSIS — K900 Celiac disease: Secondary | ICD-10-CM

## 2021-06-03 DIAGNOSIS — I4819 Other persistent atrial fibrillation: Secondary | ICD-10-CM | POA: Diagnosis not present

## 2021-06-03 DIAGNOSIS — G610 Guillain-Barre syndrome: Secondary | ICD-10-CM | POA: Diagnosis not present

## 2021-06-03 DIAGNOSIS — G894 Chronic pain syndrome: Secondary | ICD-10-CM | POA: Diagnosis not present

## 2021-06-03 DIAGNOSIS — M4126 Other idiopathic scoliosis, lumbar region: Secondary | ICD-10-CM | POA: Diagnosis not present

## 2021-06-03 DIAGNOSIS — Z993 Dependence on wheelchair: Secondary | ICD-10-CM

## 2021-06-03 DIAGNOSIS — N319 Neuromuscular dysfunction of bladder, unspecified: Secondary | ICD-10-CM

## 2021-06-03 DIAGNOSIS — M179 Osteoarthritis of knee, unspecified: Secondary | ICD-10-CM

## 2021-06-03 DIAGNOSIS — E222 Syndrome of inappropriate secretion of antidiuretic hormone: Secondary | ICD-10-CM

## 2021-06-03 DIAGNOSIS — Z7901 Long term (current) use of anticoagulants: Secondary | ICD-10-CM

## 2021-06-03 DIAGNOSIS — Z9181 History of falling: Secondary | ICD-10-CM

## 2021-06-03 DIAGNOSIS — H409 Unspecified glaucoma: Secondary | ICD-10-CM

## 2021-06-03 DIAGNOSIS — K52839 Microscopic colitis, unspecified: Secondary | ICD-10-CM

## 2021-06-04 ENCOUNTER — Telehealth: Payer: Self-pay | Admitting: *Deleted

## 2021-06-04 DIAGNOSIS — G894 Chronic pain syndrome: Secondary | ICD-10-CM | POA: Diagnosis not present

## 2021-06-04 DIAGNOSIS — H409 Unspecified glaucoma: Secondary | ICD-10-CM | POA: Diagnosis not present

## 2021-06-04 DIAGNOSIS — G610 Guillain-Barre syndrome: Secondary | ICD-10-CM | POA: Diagnosis not present

## 2021-06-04 DIAGNOSIS — Z9181 History of falling: Secondary | ICD-10-CM | POA: Diagnosis not present

## 2021-06-04 DIAGNOSIS — M179 Osteoarthritis of knee, unspecified: Secondary | ICD-10-CM | POA: Diagnosis not present

## 2021-06-04 DIAGNOSIS — E222 Syndrome of inappropriate secretion of antidiuretic hormone: Secondary | ICD-10-CM | POA: Diagnosis not present

## 2021-06-04 DIAGNOSIS — K900 Celiac disease: Secondary | ICD-10-CM | POA: Diagnosis not present

## 2021-06-04 DIAGNOSIS — M519 Unspecified thoracic, thoracolumbar and lumbosacral intervertebral disc disorder: Secondary | ICD-10-CM | POA: Diagnosis not present

## 2021-06-04 DIAGNOSIS — K52839 Microscopic colitis, unspecified: Secondary | ICD-10-CM | POA: Diagnosis not present

## 2021-06-04 DIAGNOSIS — N319 Neuromuscular dysfunction of bladder, unspecified: Secondary | ICD-10-CM | POA: Diagnosis not present

## 2021-06-04 DIAGNOSIS — Z993 Dependence on wheelchair: Secondary | ICD-10-CM | POA: Diagnosis not present

## 2021-06-04 DIAGNOSIS — I4819 Other persistent atrial fibrillation: Secondary | ICD-10-CM | POA: Diagnosis not present

## 2021-06-04 DIAGNOSIS — M4126 Other idiopathic scoliosis, lumbar region: Secondary | ICD-10-CM | POA: Diagnosis not present

## 2021-06-04 DIAGNOSIS — Z7901 Long term (current) use of anticoagulants: Secondary | ICD-10-CM | POA: Diagnosis not present

## 2021-06-04 NOTE — Telephone Encounter (Signed)
Oral swab drug screen was consistent for prescribed medications.  ?

## 2021-06-07 DIAGNOSIS — G610 Guillain-Barre syndrome: Secondary | ICD-10-CM | POA: Diagnosis not present

## 2021-06-08 DIAGNOSIS — M519 Unspecified thoracic, thoracolumbar and lumbosacral intervertebral disc disorder: Secondary | ICD-10-CM | POA: Diagnosis not present

## 2021-06-08 DIAGNOSIS — Z7901 Long term (current) use of anticoagulants: Secondary | ICD-10-CM | POA: Diagnosis not present

## 2021-06-08 DIAGNOSIS — E222 Syndrome of inappropriate secretion of antidiuretic hormone: Secondary | ICD-10-CM | POA: Diagnosis not present

## 2021-06-08 DIAGNOSIS — K52839 Microscopic colitis, unspecified: Secondary | ICD-10-CM | POA: Diagnosis not present

## 2021-06-08 DIAGNOSIS — I4819 Other persistent atrial fibrillation: Secondary | ICD-10-CM | POA: Diagnosis not present

## 2021-06-08 DIAGNOSIS — G894 Chronic pain syndrome: Secondary | ICD-10-CM | POA: Diagnosis not present

## 2021-06-08 DIAGNOSIS — K900 Celiac disease: Secondary | ICD-10-CM | POA: Diagnosis not present

## 2021-06-08 DIAGNOSIS — H409 Unspecified glaucoma: Secondary | ICD-10-CM | POA: Diagnosis not present

## 2021-06-08 DIAGNOSIS — Z9181 History of falling: Secondary | ICD-10-CM | POA: Diagnosis not present

## 2021-06-08 DIAGNOSIS — G610 Guillain-Barre syndrome: Secondary | ICD-10-CM | POA: Diagnosis not present

## 2021-06-08 DIAGNOSIS — Z993 Dependence on wheelchair: Secondary | ICD-10-CM | POA: Diagnosis not present

## 2021-06-08 DIAGNOSIS — M4126 Other idiopathic scoliosis, lumbar region: Secondary | ICD-10-CM | POA: Diagnosis not present

## 2021-06-08 DIAGNOSIS — M179 Osteoarthritis of knee, unspecified: Secondary | ICD-10-CM | POA: Diagnosis not present

## 2021-06-08 DIAGNOSIS — N319 Neuromuscular dysfunction of bladder, unspecified: Secondary | ICD-10-CM | POA: Diagnosis not present

## 2021-06-10 DIAGNOSIS — E222 Syndrome of inappropriate secretion of antidiuretic hormone: Secondary | ICD-10-CM | POA: Diagnosis not present

## 2021-06-10 DIAGNOSIS — M179 Osteoarthritis of knee, unspecified: Secondary | ICD-10-CM | POA: Diagnosis not present

## 2021-06-10 DIAGNOSIS — K52839 Microscopic colitis, unspecified: Secondary | ICD-10-CM | POA: Diagnosis not present

## 2021-06-10 DIAGNOSIS — K900 Celiac disease: Secondary | ICD-10-CM | POA: Diagnosis not present

## 2021-06-10 DIAGNOSIS — G894 Chronic pain syndrome: Secondary | ICD-10-CM | POA: Diagnosis not present

## 2021-06-10 DIAGNOSIS — Z993 Dependence on wheelchair: Secondary | ICD-10-CM | POA: Diagnosis not present

## 2021-06-10 DIAGNOSIS — N319 Neuromuscular dysfunction of bladder, unspecified: Secondary | ICD-10-CM | POA: Diagnosis not present

## 2021-06-10 DIAGNOSIS — H409 Unspecified glaucoma: Secondary | ICD-10-CM | POA: Diagnosis not present

## 2021-06-10 DIAGNOSIS — Z9181 History of falling: Secondary | ICD-10-CM | POA: Diagnosis not present

## 2021-06-10 DIAGNOSIS — M4126 Other idiopathic scoliosis, lumbar region: Secondary | ICD-10-CM | POA: Diagnosis not present

## 2021-06-10 DIAGNOSIS — M519 Unspecified thoracic, thoracolumbar and lumbosacral intervertebral disc disorder: Secondary | ICD-10-CM | POA: Diagnosis not present

## 2021-06-10 DIAGNOSIS — I4819 Other persistent atrial fibrillation: Secondary | ICD-10-CM | POA: Diagnosis not present

## 2021-06-10 DIAGNOSIS — Z7901 Long term (current) use of anticoagulants: Secondary | ICD-10-CM | POA: Diagnosis not present

## 2021-06-10 DIAGNOSIS — G610 Guillain-Barre syndrome: Secondary | ICD-10-CM | POA: Diagnosis not present

## 2021-06-12 ENCOUNTER — Telehealth: Payer: Self-pay | Admitting: *Deleted

## 2021-06-12 ENCOUNTER — Telehealth: Payer: Self-pay

## 2021-06-12 DIAGNOSIS — M48062 Spinal stenosis, lumbar region with neurogenic claudication: Secondary | ICD-10-CM

## 2021-06-12 NOTE — Telephone Encounter (Signed)
Call back phone [phone]  Angela Morales has requested a referral to  [facility]. Patient said she was told by physical therapy it will help her left foot.

## 2021-06-12 NOTE — Telephone Encounter (Signed)
Angela Morales from [facility] called for POC PT 1wk2,2wk2, 1wk5.  Approval given.

## 2021-06-15 NOTE — Addendum Note (Signed)
Addended by: Genice Rouge on: 06/15/2021 01:33 PM   Modules accepted: Orders

## 2021-06-16 DIAGNOSIS — N319 Neuromuscular dysfunction of bladder, unspecified: Secondary | ICD-10-CM | POA: Diagnosis not present

## 2021-06-16 DIAGNOSIS — H409 Unspecified glaucoma: Secondary | ICD-10-CM | POA: Diagnosis not present

## 2021-06-16 DIAGNOSIS — K900 Celiac disease: Secondary | ICD-10-CM | POA: Diagnosis not present

## 2021-06-16 DIAGNOSIS — Z7901 Long term (current) use of anticoagulants: Secondary | ICD-10-CM | POA: Diagnosis not present

## 2021-06-16 DIAGNOSIS — G610 Guillain-Barre syndrome: Secondary | ICD-10-CM | POA: Diagnosis not present

## 2021-06-16 DIAGNOSIS — M4126 Other idiopathic scoliosis, lumbar region: Secondary | ICD-10-CM | POA: Diagnosis not present

## 2021-06-16 DIAGNOSIS — M179 Osteoarthritis of knee, unspecified: Secondary | ICD-10-CM | POA: Diagnosis not present

## 2021-06-16 DIAGNOSIS — K52839 Microscopic colitis, unspecified: Secondary | ICD-10-CM | POA: Diagnosis not present

## 2021-06-16 DIAGNOSIS — G894 Chronic pain syndrome: Secondary | ICD-10-CM | POA: Diagnosis not present

## 2021-06-16 DIAGNOSIS — E222 Syndrome of inappropriate secretion of antidiuretic hormone: Secondary | ICD-10-CM | POA: Diagnosis not present

## 2021-06-16 DIAGNOSIS — M519 Unspecified thoracic, thoracolumbar and lumbosacral intervertebral disc disorder: Secondary | ICD-10-CM | POA: Diagnosis not present

## 2021-06-16 DIAGNOSIS — I4819 Other persistent atrial fibrillation: Secondary | ICD-10-CM | POA: Diagnosis not present

## 2021-06-16 DIAGNOSIS — Z9181 History of falling: Secondary | ICD-10-CM | POA: Diagnosis not present

## 2021-06-16 NOTE — Telephone Encounter (Signed)
Patient informed that referral has been put in for [facility] Therapy at [facility]. Patient stated she is going there today and will let them know the referral is in.

## 2021-06-17 DIAGNOSIS — H409 Unspecified glaucoma: Secondary | ICD-10-CM | POA: Diagnosis not present

## 2021-06-17 DIAGNOSIS — N319 Neuromuscular dysfunction of bladder, unspecified: Secondary | ICD-10-CM | POA: Diagnosis not present

## 2021-06-17 DIAGNOSIS — E222 Syndrome of inappropriate secretion of antidiuretic hormone: Secondary | ICD-10-CM | POA: Diagnosis not present

## 2021-06-17 DIAGNOSIS — K900 Celiac disease: Secondary | ICD-10-CM | POA: Diagnosis not present

## 2021-06-17 DIAGNOSIS — I4819 Other persistent atrial fibrillation: Secondary | ICD-10-CM | POA: Diagnosis not present

## 2021-06-17 DIAGNOSIS — G894 Chronic pain syndrome: Secondary | ICD-10-CM | POA: Diagnosis not present

## 2021-06-17 DIAGNOSIS — Z9181 History of falling: Secondary | ICD-10-CM | POA: Diagnosis not present

## 2021-06-17 DIAGNOSIS — G610 Guillain-Barre syndrome: Secondary | ICD-10-CM | POA: Diagnosis not present

## 2021-06-17 DIAGNOSIS — K52839 Microscopic colitis, unspecified: Secondary | ICD-10-CM | POA: Diagnosis not present

## 2021-06-17 DIAGNOSIS — M4126 Other idiopathic scoliosis, lumbar region: Secondary | ICD-10-CM | POA: Diagnosis not present

## 2021-06-17 DIAGNOSIS — M519 Unspecified thoracic, thoracolumbar and lumbosacral intervertebral disc disorder: Secondary | ICD-10-CM | POA: Diagnosis not present

## 2021-06-17 DIAGNOSIS — Z7901 Long term (current) use of anticoagulants: Secondary | ICD-10-CM | POA: Diagnosis not present

## 2021-06-17 DIAGNOSIS — M179 Osteoarthritis of knee, unspecified: Secondary | ICD-10-CM | POA: Diagnosis not present

## 2021-06-24 DIAGNOSIS — G894 Chronic pain syndrome: Secondary | ICD-10-CM | POA: Diagnosis not present

## 2021-06-24 DIAGNOSIS — K900 Celiac disease: Secondary | ICD-10-CM | POA: Diagnosis not present

## 2021-06-24 DIAGNOSIS — K52839 Microscopic colitis, unspecified: Secondary | ICD-10-CM | POA: Diagnosis not present

## 2021-06-24 DIAGNOSIS — Z9181 History of falling: Secondary | ICD-10-CM | POA: Diagnosis not present

## 2021-06-24 DIAGNOSIS — M179 Osteoarthritis of knee, unspecified: Secondary | ICD-10-CM | POA: Diagnosis not present

## 2021-06-24 DIAGNOSIS — E222 Syndrome of inappropriate secretion of antidiuretic hormone: Secondary | ICD-10-CM | POA: Diagnosis not present

## 2021-06-24 DIAGNOSIS — M4126 Other idiopathic scoliosis, lumbar region: Secondary | ICD-10-CM | POA: Diagnosis not present

## 2021-06-24 DIAGNOSIS — Z7901 Long term (current) use of anticoagulants: Secondary | ICD-10-CM | POA: Diagnosis not present

## 2021-06-24 DIAGNOSIS — G610 Guillain-Barre syndrome: Secondary | ICD-10-CM | POA: Diagnosis not present

## 2021-06-24 DIAGNOSIS — N319 Neuromuscular dysfunction of bladder, unspecified: Secondary | ICD-10-CM | POA: Diagnosis not present

## 2021-06-24 DIAGNOSIS — H409 Unspecified glaucoma: Secondary | ICD-10-CM | POA: Diagnosis not present

## 2021-06-24 DIAGNOSIS — M519 Unspecified thoracic, thoracolumbar and lumbosacral intervertebral disc disorder: Secondary | ICD-10-CM | POA: Diagnosis not present

## 2021-06-24 DIAGNOSIS — I4819 Other persistent atrial fibrillation: Secondary | ICD-10-CM | POA: Diagnosis not present

## 2021-06-26 DIAGNOSIS — M21372 Foot drop, left foot: Secondary | ICD-10-CM | POA: Diagnosis not present

## 2021-06-26 DIAGNOSIS — M21371 Foot drop, right foot: Secondary | ICD-10-CM | POA: Diagnosis not present

## 2021-06-26 DIAGNOSIS — G610 Guillain-Barre syndrome: Secondary | ICD-10-CM | POA: Diagnosis not present

## 2021-07-03 ENCOUNTER — Other Ambulatory Visit: Payer: Self-pay | Admitting: Cardiology

## 2021-07-03 NOTE — Telephone Encounter (Signed)
Prescription refill request for Eliquis received. Indication: afib  Last office visit: 04/28/2021 Scr: 0.58, 04/06/2021 Age: 76 yo  Weight: 59 kg   Refill sent.

## 2021-07-07 ENCOUNTER — Ambulatory Visit: Payer: Medicare Other | Admitting: Neurology

## 2021-07-07 ENCOUNTER — Encounter: Payer: Self-pay | Admitting: Neurology

## 2021-07-07 VITALS — BP 110/61 | HR 77 | Ht 63.0 in | Wt 137.0 lb

## 2021-07-07 DIAGNOSIS — G894 Chronic pain syndrome: Secondary | ICD-10-CM

## 2021-07-07 DIAGNOSIS — G610 Guillain-Barre syndrome: Secondary | ICD-10-CM | POA: Diagnosis not present

## 2021-07-07 DIAGNOSIS — I480 Paroxysmal atrial fibrillation: Secondary | ICD-10-CM | POA: Diagnosis not present

## 2021-07-07 DIAGNOSIS — R200 Anesthesia of skin: Secondary | ICD-10-CM | POA: Diagnosis not present

## 2021-07-07 DIAGNOSIS — R269 Unspecified abnormalities of gait and mobility: Secondary | ICD-10-CM

## 2021-07-07 DIAGNOSIS — R208 Other disturbances of skin sensation: Secondary | ICD-10-CM

## 2021-07-07 NOTE — Progress Notes (Signed)
[facility]  PATIENT: Angela Morales DOB: [birth year]  REFERRING DOCTOR OR PCP:   Thayer Jew. Pharr MD (PCP); Reesa Chew, PA-C SOURCE: Patient, notes from prolonged hospital stay, imaging and lab reports.  _________________________________   HISTORICAL  CHIEF COMPLAINT:  Chief Complaint  Patient presents with   New Patient (Initial Visit)    Rm 16, w caregiver Blanch Media. Internal referral for GBS. Pt has bilateral numbness/tingling in feet's. Left foot curls under. Has trouble walking. Has been using a walker. Today she ambulates in WC. On gabapentin at bedtime.     HISTORY OF PRESENT ILLNESS:  I had the pleasure of seeing your patient, Angela Morales, at [facility] for neurologic consultation regarding her recent Guillain-Barr syndrome encourage sequela with poor gait, dysesthesias and foot numbness and weakness.  She is a 76 year old woman who was diagnosed with GBS after presenting with a rapid ascending numbness 02/05/2021.   She presented to the hospital and was admitted.  She was on a respirator x 21 days, unresponsive for most of that time.   She needed a tracheostomy and feeding tube.   She also had blood pressure spikes and worsening of her AFib.   After improving, she went to Rehab x a couple weeks (interrupted due to need for amiodarone and then cardioversion for  AFib.  She spent a total of 61 days in the hospital and rehab.     Her GBS was treated with IVIg.       She was discharged 04/08/2021.   SHe feels she has continued to improve.   She She can walk about 100 feet now with a walker.   Balance has improved.     She still has numbness in the feet.  She has less dysesthesia and allodynia than she had a couple months ago but this is still present.    She takes gabapentin at night only as it made her sleepy during the day.   She has weakness in her toes (left worse) but hands are now strong again.   Bladder function is fine.  She is doing  home PT and OT twice a week.  She has orthotic splints due to foot drops.   She does not feel she walks better with the splints -- stairs especially seem harder with the splints.     She has lumbar DJD and back  pain.   Before GBS she had mild toe numbness.  She has a right sciatica to her knee at times.  She takes hydrocodone and Tylenol tid.     MRI of the brain 02/09/2021 showed mild chronic microvascular ischemic change but no acute findings.  MRI of the lumbar spine 02/06/2021 showed normal nerve root/cauda equina.  She had multilevel degenerative changes with moderate to severe foraminal narrowing and lateral recess stenosis to the right at L3-L4 that could affect the L3 and L4 nerve roots, foraminal narrowing to the left at L4-L5 that could affect the left L4 nerve root and foraminal narrowing to the left at L5-S1 that could affect the left L5 nerve root.  REVIEW OF SYSTEMS: Constitutional: No fevers, chills, sweats, or change in appetite Eyes: No visual changes, double vision, eye pain Ear, nose and throat: No hearing loss, ear pain, nasal congestion, sore throat Cardiovascular: No chest pain, palpitations Respiratory:  No shortness of breath at rest or with exertion.   No wheezes GastrointestinaI: No nausea, vomiting, diarrhea, abdominal pain, fecal incontinence Genitourinary:  No dysuria, urinary retention  or frequency.  No nocturia. Musculoskeletal:  No neck pain,   She has  back pain Integumentary: No rash, pruritus, skin lesions Neurological: as above Psychiatric: No depression at this time.  No anxiety Endocrine: No palpitations, diaphoresis, change in appetite, change in weigh or increased thirst Hematologic/Lymphatic:  No anemia, purpura, petechiae. Allergic/Immunologic: No itchy/runny eyes, nasal congestion, recent allergic reactions, rashes  ALLERGIES: Allergies  Allergen Reactions   Gluten Meal Other (See Comments)    celiac disease   Lactose Intolerance (Gi) Other (See  Comments)    Gas and bloating   Cefdinir Rash    HOME MEDICATIONS:  Current Outpatient Medications:    acetaminophen (TYLENOL) 500 MG tablet, Take 500 mg by mouth every 6 (six) hours as needed., Disp: , Rfl:    amiodarone (PACERONE) 200 MG tablet, Take 0.5 tablets (100 mg total) by mouth daily., Disp: 45 tablet, Rfl: 2   ELIQUIS 5 MG TABS tablet, TAKE 1 TABLET BY MOUTH TWICE A DAY, Disp: 180 tablet, Rfl: 1   gabapentin (NEURONTIN) 300 MG capsule, TAKE 1 CAPSULE BY MOUTH DAILY AT BEDTIME, Disp: 30 capsule, Rfl: 5   HYDROcodone-acetaminophen (NORCO) 5-325 MG tablet, Take 1 tablet by mouth 3 (three) times daily as needed for moderate pain., Disp: 90 tablet, Rfl: 0   latanoprost (XALATAN) 0.005 % ophthalmic solution, Place 1 drop into the left eye at bedtime., Disp: 2.5 mL, Rfl: 12   levothyroxine (SYNTHROID) 50 MCG tablet, Take 50 mcg by mouth daily before breakfast., Disp: , Rfl:    polyethylene glycol (MIRALAX / GLYCOLAX) 17 g packet, Take 17 g by mouth daily as needed., Disp: 14 each, Rfl: 0   polyvinyl alcohol (LIQUIFILM TEARS) 1.4 % ophthalmic solution, Place 1 drop into both eyes as needed for dry eyes., Disp: 15 mL, Rfl: 0   potassium chloride SA (KLOR-CON) 20 MEQ tablet, Take 3 tablets (60 mEq total) by mouth daily., Disp: 90 tablet, Rfl: 0   tamsulosin (FLOMAX) 0.4 MG CAPS capsule, TAKE 2 CAPSULES (0.8 MG TOTAL) BY MOUTH DAILY AFTER SUPPER. (Patient taking differently: Take 0.4 mg by mouth daily after supper.), Disp: 60 capsule, Rfl: 5   timolol (TIMOPTIC) 0.5 % ophthalmic solution, Place 1 drop into the left eye 2 (two) times daily., Disp: 10 mL, Rfl:    ZIOPTAN 0.0015 % SOLN, Place 1 drop into the left eye at bedtime., Disp: , Rfl:   PAST MEDICAL HISTORY: Past Medical History:  Diagnosis Date   Abnormal uterine bleeding    on HRT   Allergy    seasonal   Arthritis    Atrial fibrillation (HCC)    Celiac disease    DDD (degenerative disc disease)    Dysrhythmia    GBS  (Guillain-Barre syndrome) (HCC)    Glaucoma    Microscopic colitis    Osteoarthritis of both knees    PONV (postoperative nausea and vomiting)    SVT (supraventricular tachycardia) ([place])     PAST SURGICAL HISTORY: Past Surgical History:  Procedure Laterality Date   APPENDECTOMY     ATRIAL FIBRILLATION ABLATION N/A 01/28/2021   Procedure: ATRIAL FIBRILLATION ABLATION;  Surgeon: Constance Haw, MD;  Location: [place] [facility];  Service: Cardiovascular;  Laterality: N/A;   CARDIOVERSION N/A 10/30/2020   Procedure: CARDIOVERSION;  Surgeon: Jerline Pain, MD;  Location: [facility] ENDOSCOPY;  Service: Cardiovascular;  Laterality: N/A;   CARDIOVERSION N/A 03/16/2021   Procedure: CARDIOVERSION;  Surgeon: Acie Fredrickson Wonda Cheng, MD;  Location: [place];  Service: Cardiovascular;  Laterality: N/A;   COLONOSCOPY     FOOT FRACTURE SURGERY  10/2013   TONSILLECTOMY AND ADENOIDECTOMY     TOTAL KNEE ARTHROPLASTY Left 03/22/2016   Procedure: TOTAL KNEE ARTHROPLASTY;  Surgeon: Dorna Leitz, MD;  Location: [place];  Service: Orthopedics;  Laterality: Left;   TUBAL LIGATION  1981   VAGINAL HYSTERECTOMY  04/20/05   prolapse, adenomyosis    FAMILY HISTORY: Family History  Problem Relation Age of Onset   Osteoporosis Mother    Stroke Mother        mini   Prostate cancer Father    Heart disease Brother 18       shunt put in heart   Osteoarthritis Maternal Grandmother    Bipolar disorder Son    Liver disease Son    Breast cancer Cousin        1st cousin   Colon cancer Neg Hx     SOCIAL HISTORY:  Social History   Socioeconomic History   Marital status: Married    Spouse name: [name]   Number of children: 3   Years of education: Not on file   Highest education level: Not on file  Occupational History   Occupation: retired  Tobacco Use   Smoking status: Never   Smokeless tobacco: Never  Vaping Use   Vaping Use: Never used  Substance and Sexual Activity   Alcohol use: Yes    Alcohol/week: 4.0  - 6.0 standard drinks    Types: 4 - 6 Glasses of wine per week    Comment: occasional   Drug use: No   Sexual activity: Yes    Partners: Male    Birth control/protection: Surgical    Comment: TVH  Other Topics Concern   Not on file  Social History Narrative   Lives with husband and son   Left handed   Caffeine: 1 cup of coffee a day   Social Determinants of Health   Financial Resource Strain: Not on file  Food Insecurity: Not on file  Transportation Needs: Not on file  Physical Activity: Not on file  Stress: Not on file  Social Connections: Not on file  Intimate Partner Violence: Not on file     PHYSICAL EXAM  Vitals:   07/07/21 1024  BP: 110/61  Pulse: 77  Weight: 137 lb (62.1 kg)  Height: 5\' 3"  (1.6 m)    Body mass index is 24.27 kg/m.   General: The patient is well-developed and well-nourished and in no acute distress  HEENT:  Head is [place]/AT.  Sclera are anicteric.    Neck: No carotid bruits are noted.  The neck is nontender.  Cardiovascular: The heart has a regular rate and rhythm with a normal S1 and S2. There were no murmurs, gallops or rubs.    Skin: Extremities are without rash or  edema.  Musculoskeletal:  Back is nontender  Neurologic Exam  Mental status: The patient is alert and oriented x 3 at the time of the examination. The patient has apparent normal recent and remote memory, with an apparently normal attention span and concentration ability.   Speech is normal.  Cranial nerves: Extraocular movements are full. There is good facial sensation to soft touch bilaterally.Facial strength is normal.  Trapezius and sternocleidomastoid strength is normal. No dysarthria is noted.  The tongue is midline, and the patient has symmetric elevation of the soft palate. No obvious hearing deficits are noted.  Motor:  Muscle bulk is normal.   Tone is normal.  In the arms, strength was 4+/5 in the ulnar innervated hand muscles and the triceps and 5/5 elsewhere.   In the right leg, strength was generally 4+/5 except 4/5 EHL.  In the left leg, strength was generally 4/5 proximally and 4 -/5 in the toe and ankle extensors.  The left foot inverts..    Sensory: Sensory testing is intact to pinprick, soft touch and vibration sensation in the arms and hands.  She had normal sensation to vibration at the knees and ankles but moderately reduced vibration sensation at the toes.  Pinprick sensation was moderately reduced in the foot and severely reduced at the toes..  Coordination: Cerebellar testing reveals good finger-nose-finger and heel-to-shin bilaterally.  Gait and station: Station requires unilateral support and she requires bilateral support to walk.  The left foot inverts that she walks.  She cannot tandem walk.  Romberg is positive.   Reflexes: Deep tendon reflexes are symmetric and normal in the arms but absent at the knees and ankles.   Plantar responses are flexor.    DIAGNOSTIC DATA (LABS, IMAGING, TESTING) - I reviewed patient records, labs, notes, testing and imaging myself where available.  Lab Results  Component Value Date   WBC 6.3 04/06/2021   HGB 12.7 04/06/2021   HCT 38.6 04/06/2021   MCV 95.8 04/06/2021   PLT 223 04/06/2021      Component Value Date/Time   NA 134 (L) 04/06/2021 0633   NA 137 01/22/2021 1138   K 4.2 04/06/2021 0633   CL 101 04/06/2021 0633   CO2 25 04/06/2021 0633   GLUCOSE 92 04/06/2021 0633   BUN 8 04/06/2021 0633   BUN 8 01/22/2021 1138   CREATININE 0.58 04/06/2021 0633   CALCIUM 8.6 (L) 04/06/2021 0633   PROT 6.7 03/21/2021 0635   ALBUMIN 2.6 (L) 03/21/2021 0635   AST 27 03/21/2021 0635   ALT 17 03/21/2021 0635   ALKPHOS 69 03/21/2021 0635   BILITOT 0.5 03/21/2021 0635   GFRNONAA >60 04/06/2021 0633   GFRAA >60 03/23/2016 0315   Lab Results  Component Value Date   TRIG 92 02/13/2021   Lab Results  Component Value Date   HGBA1C 5.0 02/11/2021   Lab Results  Component Value Date   [id number] 08/16/2016   Lab Results  Component Value Date   TSH 1.770 02/07/2021       ASSESSMENT AND PLAN  GBS (Guillain Barre syndrome) (HCC)  Chronic pain syndrome  Paroxysmal atrial fibrillation (HCC)  Numbness  Dysesthesia  Gait disturbance    In summary, Ms. 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  She did a few weeks of inpatient rehab and is now doing outpatient PT and OT twice a week.  Her main problems continue to be balance/gait and foot/ankle weakness and numbness.    The left leg is worse than the right.  She finds wearing the AFO splints to be painful and she has more difficulty walking rather than last.  I advised her to try walking with just the left splint to see if that is better than wearing both splints.  She does have dysesthetic pain.  Because daytime gabapentin makes her sleepy she just takes it at night.  This helps her sleep but does not help the pain during the day any.  She feels that this problem is better than it was a couple of months ago and she does not wish to try  a different medication during the day.  She continues to have back pain helped by hydrocodone and Tylenol.  Due to being on Eliquis, she does not take NSAIDs.  We discussed prognosis.  I anticipate she will continue to improve some but is unlikely to get back to baseline.  Hopefully she will get enough proprioception in the right foot to drive though I do not think she is ready to do so at this time.  She will return to see me in 4 months or sooner if there are new or worsening neurologic symptoms.  Thank you for asking me to see Ms. Araceli Bouche.  Please let me know if I can be of further assistance with her or other patients in the future.       Tameron Lama A. Felecia Shelling, MD, [facility] [id number], [id number] AM Certified in Neurology, Clinical Neurophysiology, Sleep Medicine and Neuroimaging  [facility] [address] [place], [place] [address] [phone]

## 2021-07-16 ENCOUNTER — Other Ambulatory Visit: Payer: Self-pay | Admitting: Physical Medicine and Rehabilitation

## 2021-07-28 ENCOUNTER — Other Ambulatory Visit: Payer: Self-pay

## 2021-07-28 ENCOUNTER — Ambulatory Visit: Payer: Medicare Other | Admitting: Cardiology

## 2021-07-28 ENCOUNTER — Encounter: Payer: Self-pay | Admitting: Cardiology

## 2021-07-28 VITALS — BP 106/62 | HR 72 | Ht 63.0 in | Wt 140.2 lb

## 2021-07-28 DIAGNOSIS — Z79899 Other long term (current) drug therapy: Secondary | ICD-10-CM

## 2021-07-28 DIAGNOSIS — I4819 Other persistent atrial fibrillation: Secondary | ICD-10-CM

## 2021-07-28 NOTE — Progress Notes (Signed)
Electrophysiology Office Note   Date:  07/28/2021   ID:  Angela Morales, DOB 1945/12/08, MRN [id number]  PCP:  Deland Pretty, MD  Cardiologist:  [place] Primary Electrophysiologist:  Tommey Barret Meredith Leeds, MD    Chief Complaint: AF   History of Present Illness: Angela Morales is a 76 y.o. female who is being seen today for the evaluation of AF at the request of Deland Pretty, MD. Presenting today for electrophysiology evaluation.  She has a history significant for celiac disease, glaucoma, atrial fibrillation.  She was diagnosed with atrial fibrillation in 2018 in the setting of a GI illness.  She was later diagnosed with celiac disease.  She developed persistent atrial fibrillation and underwent unsuccessful cardioversion.  She has not status post atrial fibrillation ablation 01/28/2021.  A week after her ablation, she presented to the hospital with progressive paralysis.  She was diagnosed with Guillain-Barr syndrome.  She had prolonged hospitalization with rehab.  She presented to rehab and atrial flutter and is status post cardioversion.  She is now on amiodarone.  Today, denies symptoms of palpitations, chest pain, shortness of breath, orthopnea, PND, lower extremity edema, claudication, dizziness, presyncope, syncope, bleeding, or neurologic sequela. The patient is tolerating medications without difficulties.  He is currently feeling well.  She is noted no further episodes of atrial fibrillation on her current dose of amiodarone.  She continues to get stronger.  She is walking with a walker and doing training in the pool.   Past Medical History:  Diagnosis Date   Abnormal uterine bleeding    on HRT   Allergy    seasonal   Arthritis    Atrial fibrillation (HCC)    Celiac disease    DDD (degenerative disc disease)    Dysrhythmia    GBS (Guillain-Barre syndrome) (HCC)    Glaucoma    Microscopic colitis    Osteoarthritis of both knees    PONV (postoperative nausea and  vomiting)    SVT (supraventricular tachycardia) ([place])    Past Surgical History:  Procedure Laterality Date   APPENDECTOMY     ATRIAL FIBRILLATION ABLATION N/A 01/28/2021   Procedure: ATRIAL FIBRILLATION ABLATION;  Surgeon: Constance Haw, MD;  Location: [place] [facility];  Service: Cardiovascular;  Laterality: N/A;   CARDIOVERSION N/A 10/30/2020   Procedure: CARDIOVERSION;  Surgeon: Jerline Pain, MD;  Location: [facility];  Service: Cardiovascular;  Laterality: N/A;   CARDIOVERSION N/A 03/16/2021   Procedure: CARDIOVERSION;  Surgeon: Thayer Headings, MD;  Location: [facility];  Service: Cardiovascular;  Laterality: N/A;   COLONOSCOPY     FOOT FRACTURE SURGERY  10/2013   TONSILLECTOMY AND ADENOIDECTOMY     TOTAL KNEE ARTHROPLASTY Left 03/22/2016   Procedure: TOTAL KNEE ARTHROPLASTY;  Surgeon: Dorna Leitz, MD;  Location: [place];  Service: Orthopedics;  Laterality: Left;   TUBAL LIGATION  1981   VAGINAL HYSTERECTOMY  04/20/05   prolapse, adenomyosis     Current Outpatient Medications  Medication Sig Dispense Refill   acetaminophen (TYLENOL) 500 MG tablet Take 500 mg by mouth every 6 (six) hours as needed.     amiodarone (PACERONE) 200 MG tablet Take 0.5 tablets (100 mg total) by mouth daily. 45 tablet 2   ELIQUIS 5 MG TABS tablet TAKE 1 TABLET BY MOUTH TWICE A DAY 180 tablet 1   gabapentin (NEURONTIN) 300 MG capsule TAKE 1 CAPSULE BY MOUTH DAILY AT BEDTIME 30 capsule 5   HYDROcodone-acetaminophen (NORCO) 5-325 MG tablet Take 1 tablet by  mouth 3 (three) times daily as needed for moderate pain. 90 tablet 0   levothyroxine (SYNTHROID) 50 MCG tablet Take 50 mcg by mouth daily before breakfast.     polyethylene glycol (MIRALAX / GLYCOLAX) 17 g packet Take 17 g by mouth daily as needed. (Patient taking differently: Take 17 g by mouth daily as needed (constipation).) 14 each 0   polyvinyl alcohol (LIQUIFILM TEARS) 1.4 % ophthalmic solution Place 1 drop into both eyes as needed for dry  eyes. 15 mL 0   potassium chloride SA (KLOR-CON) 20 MEQ tablet Take 3 tablets (60 mEq total) by mouth daily. 90 tablet 0   tamsulosin (FLOMAX) 0.4 MG CAPS capsule TAKE 2 CAPSULES (0.8 MG TOTAL) BY MOUTH DAILY AFTER SUPPER. (Patient taking differently: Take 0.4 mg by mouth daily after supper.) 60 capsule 5   timolol (TIMOPTIC) 0.5 % ophthalmic solution Place 1 drop into the left eye 2 (two) times daily. 10 mL    ZIOPTAN 0.0015 % SOLN Place 1 drop into the left eye at bedtime.     latanoprost (XALATAN) 0.005 % ophthalmic solution Place 1 drop into the left eye at bedtime. (Patient not taking: Reported on 07/28/2021) 2.5 mL 12   No current facility-administered medications for this visit.    Allergies:   Gluten meal, Lactose intolerance (gi), and Cefdinir   Social History:  The patient  reports that she has never smoked. She has never used smokeless tobacco. She reports current alcohol use of about 4.0 - 6.0 standard drinks per week. She reports that she does not use drugs.   Family History:  The patient's family history includes Bipolar disorder in her son; Breast cancer in her cousin; Heart disease (age of onset: 32) in her brother; Liver disease in her son; Osteoarthritis in her maternal grandmother; Osteoporosis in her mother; Prostate cancer in her father; Stroke in her mother.   ROS:  Please see the history of present illness.   Otherwise, review of systems is positive for none.   All other systems are reviewed and negative.   PHYSICAL EXAM: VS:  BP 106/62    Pulse 72    Ht 5' 3"  (1.6 m)    Wt 140 lb 3.2 oz (63.6 kg)    LMP 04/20/2005    SpO2 99%    BMI 24.84 kg/m  , BMI Body mass index is 24.84 kg/m. GEN: Well nourished, well developed, in no acute distress  HEENT: normal  Neck: no JVD, carotid bruits, or masses Cardiac: RRR; no murmurs, rubs, or gallops,no edema  Respiratory:  clear to auscultation bilaterally, normal work of breathing GI: soft, nontender, nondistended, + BS MS: no  deformity or atrophy  Skin: warm and dry Neuro:  Strength and sensation are intact Psych: euthymic mood, full affect  EKG:  EKG is ordered today. Personal review of the ekg ordered shows sinus rhythm  Recent Labs: 02/07/2021: TSH 1.770 02/18/2021: B Natriuretic Peptide 560.1 03/13/2021: Magnesium 2.0 03/21/2021: ALT 17 04/06/2021: BUN 8; Creatinine, Ser 0.58; Hemoglobin 12.7; Platelets 223; Potassium 4.2; Sodium 134    Lipid Panel     Component Value Date/Time   TRIG 92 02/13/2021 0316     Wt Readings from Last 3 Encounters:  07/28/21 140 lb 3.2 oz (63.6 kg)  07/07/21 137 lb (62.1 kg)  04/28/21 130 lb (59 kg)      Other studies Reviewed: Additional studies/ records that were reviewed today include: TTE 12/05/20  Review of the above records today demonstrates:  1. Left ventricular ejection fraction, by estimation, is 55%. The left  ventricle has normal function. The left ventricle has no regional wall  motion abnormalities. Left ventricular diastolic parameters were grossly  normal.   2. Right ventricular systolic function is normal. The right ventricular  size is mildly enlarged. There is normal pulmonary artery systolic  pressure. The estimated right ventricular systolic pressure is 56.3 mmHg.   3. Left atrial size was moderately dilated.   4. Right atrial size was mild to moderately dilated.   5. The mitral valve is normal in structure. Mild mitral valve  regurgitation. No evidence of mitral stenosis.   6. Tricuspid valve regurgitation is mild to moderate.   7. The aortic valve is grossly normal. There is mild calcification of the  aortic valve. Aortic valve regurgitation is not visualized. No aortic  stenosis is present.   8. The inferior vena cava is normal in size with greater than 50%  respiratory variability, suggesting right atrial pressure of 3 mmHg.    ASSESSMENT AND PLAN:  1.  Persistent atrial fibrillation/flutter: Currently on Eliquis 5 mg twice daily,  amiodarone 100 mg daily.  High risk medication monitoring for amiodarone.  CHA2DS2-VASc of 3.  Status post ablation 01/28/2021.  She had both typical and atypical atrial flutter during ablation.  She fortunately remains in sinus rhythm.  We Breton Berns continue with current management.  We Bion Todorov check amiodarone labs today.   Current medicines are reviewed at length with the patient today.   The patient does not have concerns regarding her medicines.  The following changes were made today: None  Labs/ tests ordered today include:  Orders Placed This Encounter  Procedures   TSH   Hepatic function panel   EKG 12-Lead      Disposition:   FU with Areona Homer 6 months  Signed, Daveon Arpino Meredith Leeds, MD  07/28/2021 11:21 AM     [address] [place] [address] [place]  [place] [address] [phone] (office) [phone] (fax)

## 2021-07-28 NOTE — Patient Instructions (Signed)
Medication Instructions:  Your physician recommends that you continue on your current medications as directed. Please refer to the Current Medication list given to you today.  *If you need a refill on your cardiac medications before your next appointment, please call your pharmacy*   Lab Work: Amiodarone surveillance lab work today: TSH & LFT   If you have labs (blood work) drawn today and your tests are completely normal, you will receive your results only by: MyChart Message (if you have MyChart) OR A paper copy in the mail If you have any lab test that is abnormal or we need to change your treatment, we will call you to review the results.   Testing/Procedures: None ordered   Follow-Up: At [facility], you and your health needs are our priority.  As part of our continuing mission to provide you with exceptional heart care, we have created designated Provider Care Teams.  These Care Teams include your primary Cardiologist (physician) and Advanced Practice Providers (APPs -  Physician Assistants and Nurse Practitioners) who all work together to provide you with the care you need, when you need it.  Your next appointment:   6 month(s)  The format for your next appointment:   In Person  Provider:   Loman Brooklyn, MD    Thank you for choosing [facility]!!   Dory Horn, RN [phone]

## 2021-07-31 LAB — HEPATIC FUNCTION PANEL
ALT: 11 IU/L (ref 0–32)
AST: 16 IU/L (ref 0–40)
Albumin: 4.2 g/dL (ref 3.7–4.7)
Alkaline Phosphatase: 64 IU/L (ref 44–121)
Bilirubin Total: 0.2 mg/dL (ref 0.0–1.2)
Bilirubin, Direct: <0.1 mg/dL (ref 0.00–0.40)
Total Protein: 7.4 g/dL (ref 6.0–8.5)

## 2021-07-31 LAB — TSH: TSH: 4.27 u[IU]/mL (ref 0.450–4.500)

## 2021-08-14 ENCOUNTER — Encounter: Payer: Self-pay | Admitting: Physical Medicine and Rehabilitation

## 2021-08-14 ENCOUNTER — Encounter
Payer: Medicare Other | Attending: Physical Medicine and Rehabilitation | Admitting: Physical Medicine and Rehabilitation

## 2021-08-14 ENCOUNTER — Other Ambulatory Visit: Payer: Self-pay

## 2021-08-14 VITALS — BP 110/69 | HR 80 | Ht 63.0 in | Wt 141.0 lb

## 2021-08-14 DIAGNOSIS — R269 Unspecified abnormalities of gait and mobility: Secondary | ICD-10-CM | POA: Diagnosis present

## 2021-08-14 DIAGNOSIS — M21372 Foot drop, left foot: Secondary | ICD-10-CM | POA: Diagnosis present

## 2021-08-14 DIAGNOSIS — G610 Guillain-Barre syndrome: Secondary | ICD-10-CM | POA: Insufficient documentation

## 2021-08-14 NOTE — Progress Notes (Signed)
Subjective:    Patient ID: Angela Morales, female    DOB: [birth year], 76 y.o.   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   Pain doing much better- pain getting better Not taking pain meds very often After vigorous PT< takes pain meds for hip And takes muscle relaxant for cramping of toes at bedtime- helps. Takes one at bedtime nightly- to prevetn Sx's.   Pain 0-2/10 on average- occ gets higher, but not much.   Been 3 weeks since took Norco.  Still takes gabapentin 300 mg QHS-   Has had some dizziness in AM- when first sits up.  BP is low side already- goes away quickly- ~ until goes away   No falls; no near falls.   Wearing L AFO- ~ 8 hours/day- gets sore in evenings.  Not wearing R AFO- since was painful and Dr Epimenio Foot didn't want her to lose mobility.   PT- released from home therapy this week.  Made referral for aqua therapy- starting next week.  Going to pool at [employer] [facility] [facility]- working on own 2x/week in pool.   Walked down 4 steps to get into car today so didn't bring w/c.  Gone up 8 steps with someone there- and behind her- if pushes it, R hip starts to hurt.   Still taking Flomax 0.4 mg qsupper.   No more swelling of feet- for almost 1 month and feet "straight"- no twisting of feet/inversin if walks without L AFO for prolonged period.   When gets out of house-  Sometimes uses tripod walker from mother with big wheels.  Sometimes uses w/c- if has distance to go- doesn't usually walk more than from parking lot until building.  Now knows where her feet are in space- is somewhat new- [ins/needles sensation much improved.    Pain Inventory Average Pain 2 Pain Right  Now 0 My pain is intermittent, tingling, and aching  LOCATION OF PAIN  back, right hip, left ankle  BOWEL Number of stools per week: 7 Oral laxative use Yes  Type of laxative Miralax Enema or suppository use No  History of colostomy No  Incontinent No   BLADDER Normal In and out cath, frequency na Able to self cath  na Bladder incontinence No  Frequent urination No  Leakage with coughing No  Difficulty starting stream No  Incomplete bladder emptying No    Mobility walk with assistance use a walker how many minutes can you walk? 20 ability to climb steps?  yes do you drive?  no  Function retired I need assistance with the following:  meal prep, household duties, and shopping  Neuro/Psych numbness trouble walking  Prior Studies Any changes since last visit?  no  Physicians involved in your care Any changes since last visit?  no   Family History  Problem Relation Age of Onset   Osteoporosis Mother    Stroke Mother        mini   Prostate cancer Father    Heart disease Brother 85       shunt put in heart   Osteoarthritis Maternal Grandmother    Bipolar disorder Son    Liver disease Son  Breast cancer Cousin        1st cousin   Colon cancer Neg Hx    Social History   Socioeconomic History   Marital status: Married    Spouse name: [name]   Number of children: 3   Years of education: Not on file   Highest education level: Not on file  Occupational History   Occupation: retired  Tobacco Use   Smoking status: Never   Smokeless tobacco: Never  Vaping Use   Vaping Use: Never used  Substance and Sexual Activity   Alcohol use: Yes    Alcohol/week: 4.0 - 6.0 standard drinks    Types: 4 - 6 Glasses of wine per week    Comment: occasional   Drug use: No   Sexual activity: Yes    Partners: Male    Birth control/protection: Surgical    Comment: TVH  Other Topics Concern   Not on file  Social History Narrative   Lives with husband and son   Left  handed   Caffeine: 1 cup of coffee a day   Social Determinants of Health   Financial Resource Strain: Not on file  Food Insecurity: Not on file  Transportation Needs: Not on file  Physical Activity: Not on file  Stress: Not on file  Social Connections: Not on file   Past Surgical History:  Procedure Laterality Date   APPENDECTOMY     ATRIAL FIBRILLATION ABLATION N/A 01/28/2021   Procedure: [address];  Surgeon: Constance Haw, MD;  Location: [place] [facility];  Service: Cardiovascular;  Laterality: N/A;   CARDIOVERSION N/A 10/30/2020   Procedure: CARDIOVERSION;  Surgeon: Jerline Pain, MD;  Location: [facility] ENDOSCOPY;  Service: Cardiovascular;  Laterality: N/A;   CARDIOVERSION N/A 03/16/2021   Procedure: CARDIOVERSION;  Surgeon: Thayer Headings, MD;  Location: [facility] ENDOSCOPY;  Service: Cardiovascular;  Laterality: N/A;   COLONOSCOPY     FOOT FRACTURE SURGERY  10/2013   TONSILLECTOMY AND ADENOIDECTOMY     TOTAL KNEE ARTHROPLASTY Left 03/22/2016   Procedure: TOTAL KNEE ARTHROPLASTY;  Surgeon: Dorna Leitz, MD;  Location: [place];  Service: Orthopedics;  Laterality: Left;   TUBAL LIGATION  1981   VAGINAL HYSTERECTOMY  04/20/05   prolapse, adenomyosis   Past Medical History:  Diagnosis Date   Abnormal uterine bleeding    on HRT   Allergy    seasonal   Arthritis    Atrial fibrillation (HCC)    Celiac disease    DDD (degenerative disc disease)    Dysrhythmia    GBS (Guillain-Barre syndrome) (HCC)    Glaucoma    Microscopic colitis    Osteoarthritis of both knees    PONV (postoperative nausea and vomiting)    SVT (supraventricular tachycardia) (HCC)    BP 110/69    Pulse 80    Ht 5\' 3"  (1.6 m)    Wt 141 lb (64 kg)    LMP 04/20/2005    SpO2 98%    BMI 24.98 kg/m   Opioid Risk Score:   Fall Risk Score:  `1  Depression screen PHQ 2/9  Depression screen [facility] 2/9 08/14/2021 05/15/2021  Decreased Interest 0 0  Down, Depressed, Hopeless 0 0  PHQ - 2 Score 0 0   Altered sleeping - 2  Tired, decreased energy - 1  Change in appetite - 0  Feeling bad or failure about yourself  - 1  Trouble concentrating - 0  Moving slowly or fidgety/restless - 0  Suicidal  thoughts - 0  PHQ-9 Score - 4  Difficult doing work/chores - Not difficult at all     Review of Systems  Musculoskeletal:  Positive for back pain.       Right hip pain Left ankle pain  All other systems reviewed and are negative.     Objective:   Physical Exam Awake, alert, appropriate, has L AFO on and 2 different shoes; accompanied by aide; has RW, NAD  MS: RLE- HF 4/5; KE  4/5 KF 4-/5 DF 4/5, and PF- 5-/5 LLE- HF 4-/5; KE 4+/5  KF 4-/5 DF 2+to 3/5; PF 4/5  Neuro: Sensation to light touch is now intact in all 4 extremities and proprioception is intact now- which is new-  Also can tell floor is cold.   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   2.  Doesn't need another Norco prescription- call if does.    3. Potassium needs to be addressed by PCP.   4. Can start to drive- in parking lot- and then back streets- and after 1-2 weeks- then can progress.   5.  Foot up brace- can get from [facility] for $20- start to work with foot up brace- stick to foot up brace for foot drop- won't prevent inversion howevr- so can try in AM and afternoon, try AFO/splint.   6.  Aqua therapy- focus on gait and strengthening.   7. Con't Gabapentin  8. F/U in 3 months.   9. Work around home  until a little tired/fatigued- don't go until really tired- with GBS, will take DAYS to recover.    I spent a total of  32  minutes on total care today- >50% coordination of care- due to  d/w pt about pain, bladder and driving.

## 2021-08-14 NOTE — Patient Instructions (Addendum)
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   2.  Doesn't need another Norco prescription- call if does.    3. Potassium needs to be addressed by PCP.   4. Can start to drive- in parking lot- and then back streets- and after 1-2 weeks- then can progress.   5.  Foot up brace- can get from [facility] for $20- start to work with foot up brace- stick to foot up brace for foot drop- won't prevent inversion howevr- so can try in AM and afternoon, try AFO/splint.   6.  Aqua therapy- focus on gait and strengthening.   7. Con't Gabapentin  8. F/U in 3 months.   9. Work around home until a little tired/fatigued- don't go until really tired- with GBS, will take DAYS to recover.

## 2021-08-24 NOTE — Therapy (Signed)
OUTPATIENT PHYSICAL THERAPY THORACOLUMBAR EVALUATION   Patient Name: Angela Morales MRN: [id number] DOB:26-Aug-1945, 76 y.o., female Today's Date: 08/25/2021   PT End of Session - 08/25/21 1132     Visit Number 1    Number of Visits 16    Date for PT Re-Evaluation 10/20/21    Authorization Time Period 08/25/21 - 10/20/21    Progress Note Due on Visit 10    PT Start Time 1119    PT Stop Time 1205    PT Time Calculation (min) 46 min    Activity Tolerance Patient tolerated treatment well    Behavior During Therapy WFL for tasks assessed/performed             Past Medical History:  Diagnosis Date   Abnormal uterine bleeding    on HRT   Allergy    seasonal   Arthritis    Atrial fibrillation (HCC)    Celiac disease    DDD (degenerative disc disease)    Dysrhythmia    GBS (Guillain-Barre syndrome) (HCC)    Glaucoma    Microscopic colitis    Osteoarthritis of both knees    PONV (postoperative nausea and vomiting)    SVT (supraventricular tachycardia) ([place])    Past Surgical History:  Procedure Laterality Date   APPENDECTOMY     ATRIAL FIBRILLATION ABLATION N/A 01/28/2021   Procedure: ATRIAL FIBRILLATION ABLATION;  Surgeon: Constance Haw, MD;  Location: [place] [facility];  Service: Cardiovascular;  Laterality: N/A;   CARDIOVERSION N/A 10/30/2020   Procedure: CARDIOVERSION;  Surgeon: Jerline Pain, MD;  Location: [facility] ENDOSCOPY;  Service: Cardiovascular;  Laterality: N/A;   CARDIOVERSION N/A 03/16/2021   Procedure: CARDIOVERSION;  Surgeon: Thayer Headings, MD;  Location: [facility] ENDOSCOPY;  Service: Cardiovascular;  Laterality: N/A;   COLONOSCOPY     FOOT FRACTURE SURGERY  10/2013   TONSILLECTOMY AND ADENOIDECTOMY     TOTAL KNEE ARTHROPLASTY Left 03/22/2016   Procedure: TOTAL KNEE ARTHROPLASTY;  Surgeon: Dorna Leitz, MD;  Location: [place];  Service: Orthopedics;  Laterality: Left;   TUBAL LIGATION  1981   VAGINAL HYSTERECTOMY  04/20/05   prolapse, adenomyosis   Patient  Active Problem List   Diagnosis Date Noted   Abnormality of gait 08/14/2021   Left foot drop 08/14/2021   Wheelchair dependence 05/15/2021   Neurogenic bladder 04/09/2021   Atrial flutter with rapid ventricular response ([place]) 03/13/2021   Urinary retention    Chronic pain syndrome    GBS (Guillain-Barre syndrome) ([place]) 03/04/2021   Sleep disturbance    Atelectasis    Malnutrition of moderate degree 02/16/2021   Elevated white blood cell count    Status post tracheostomy ([place])    Oropharyngeal dysphagia    Acute respiratory failure with hypoxia (HCC)    GBS (Guillain Barre syndrome) ([place]) 02/06/2021   Chronic anticoagulation 02/06/2021   Hyponatremia 02/06/2021   Prolonged QT interval 02/06/2021   Generalized weakness 02/06/2021   Secondary hypercoagulable state ([place]) 11/13/2020   Primary osteoarthritis of left knee 03/22/2016   Atrial fibrillation with RVR ([place]) 12/01/2013   Paroxysmal atrial fibrillation ([place]) 11/30/2013   Hypotension 11/30/2013   Atrial flutter ([place]) 11/30/2013    PCP: Deland Pretty, MD  REFERRING PROVIDER: Courtney Heys, MD  REFERRING DIAG: [phone] (ICD-10-CM) - Spinal stenosis of lumbar region with neurogenic claudication  THERAPY DIAG:  Muscle weakness (generalized)  Difficulty in walking, not elsewhere classified  ONSET DATE: 01/26/2021  SUBJECTIVE:  SUBJECTIVE STATEMENT: Pt reports she has been coming to the pool 2 x weekly and exercising Indep for 30 minutes.  She has aide daily in am to assist her.  [place] has just recently okayed her to start driving short distances.  LBP intermittent depending on activity level. Biggest complaint is weakness from GBS PERTINENT HISTORY: .  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  PAIN:  Are you having pain? No NPRS scale: 0/10 Pain location: LB and hips with radiation rle Pain orientation: Right and Lower  PAIN TYPE: aching Pain description: intermittent  Aggravating factors: steps, increased activity level. Relieving factors: rest  PRECAUTIONS: Other: resolving foot drop L>R  WEIGHT BEARING RESTRICTIONS No  FALLS:  Has patient fallen in last 6 months? No,   LIVING ENVIRONMENT: Lives with: lives with their family Lives in: House/apartment Stairs: Yes; External: 15 steps; on right going up Has following equipment at home: Single point cane, Walker - 2 wheeled, Wheelchair (manual), and Electronics engineer  OCCUPATION: retired  PLOF: Independent prior to GBS Aug 2022  PATIENT GOALS walk indep, car for myself, return to PLOF   OBJECTIVE:     SCREENING FOR RED FLAGS: Bowel or bladder incontinence: No Spinal tumors: No Cauda equina syndrome: No Compression fracture: No Abdominal aneurysm: No  COGNITION:  Overall cognitive status: Within functional limits for tasks assessed     SENSATION:  Light touch: Deficits Left distal foot decreased sensation.  Wears Left ankle inversion brace     MUSCLE LENGTH: Hamstrings: WFL    POSTURE:  Forward hip flex, asymmetry: left hip elevation, right shoulder elevation    LE AROM/PROM:  A/PROM Right 08/25/2021 Left 08/25/2021  Hip flexion    Hip extension    Hip abduction    Hip adduction    Hip internal rotation    Hip external rotation    Knee flexion    Knee extension    Ankle dorsiflexion 5 -10  Ankle plantarflexion full full  Ankle inversion    Ankle eversion     (Blank rows = not tested)  LE MMT:  MMT Right 08/25/2021 Left 08/25/2021  Hip flexion 3+ 3-  Hip extension    Hip abduction 4 4  Hip adduction 4 4  Hip internal rotation    Hip external rotation    Knee flexion 4 4  Knee extension 4 4  Ankle dorsiflexion 4 3  Ankle plantarflexion 4+ 3  Ankle inversion    Ankle  eversion     (Blank rows = not tested)  Straight leg raise test: Negative and Slump test: Negative  FUNCTIONAL TESTS:  5 times sit to stand: 21 using ue from transport chair Timed up and go (TUG): 20s   6 minute walk test  579ft GAIT: Distance walked: 550 ft Assistive device utilized: Walker - 2 wheeled Level of assistance: CGA Comments: Forward hip flex, limited heel strike bilaterally R<L, decreased step length and cadence.    TODAY'S TREATMENT  Eval, gait training   PATIENT EDUCATION:  Education details: Properties of water, benefits of aquatic therapy Person educated: Patient and Caregiver Education method: Explanation Education comprehension: verbalized understanding   HOME EXERCISE PROGRAM: To be assigned next visit aquatic.  ASSESSMENT:  CLINICAL IMPRESSION: Patient is a 76 y.o. female who was seen today for physical therapy evaluation and treatment for muscle weakness and difficulty in walking due to GBS which pt is recovering from with onset 04/18/22.  Pt reports complete loss of ability to walk within 8 hours of onset.  She has been recovering since.  Had home care for many months.  Started coming to [facility] in Dec 2023 and has been getting into pool 2 x weekly. She has regained the ability to walk using a front wheeled walker ambulating in home, using transport chair when leaving home.  She continues to have strength deficits throughout. Foot drop is resolving although left ankle with slight inversion contracture which she is braced for.  She no longer has any pain associated with GBS but does have intermittent LBP from lumbar stenosis.  She also reports having a slight scoliosis. She has begun driving in parking lots with CG. She will benefit from aquatic therapy to hasten and enhance progress towards goals.      OBJECTIVE IMPAIRMENTS Abnormal gait, decreased activity tolerance, decreased balance, decreased endurance, decreased mobility, difficulty walking,  decreased ROM, decreased strength, impaired sensation, and pain.   ACTIVITY LIMITATIONS cleaning, community activity, driving, meal prep, laundry, and shopping.   PERSONAL FACTORS Age and 3+ comorbidities:    are also affecting patient's functional outcome.    REHAB POTENTIAL: Good  CLINICAL DECISION MAKING: Evolving/moderate complexity  EVALUATION COMPLEXITY: Moderate   GOALS: Goals reviewed with patient? Yes  SHORT TERM GOALS:  STG Name Target Date Goal status  1 Pt will tolerate full sessions of aquatic therapy without reports of excessive fatigue or increase pain Baseline:  09/15/2021 INITIAL  2 Pt will be indep and compliant with initial aquatic HEP Baseline:  09/15/2021 INITIAL  3 Pt will tolerate amb one way (to or from) pool Baseline: 09/22/2021 INITIAL  4 Pt will be able to walk 10 continuous minutes in pool (without rest period) Baseline: 09/22/2021 INITIAL                  LONG TERM GOALS:   LTG Name Target Date Goal status  1 Pt will be able to climb 15 steps to access her second floor of home Baseline: 10/20/2021 INITIAL  2 Pt will be able to tolerate walking to and from pool for therapy Baseline: 10/20/2021 INITIAL  3 Pt will improve on the 6 minute walk test ambulating > 800 ft with AD as needed Baseline: 10/20/2021 INITIAL  4 Pt will improve on the 5 x STS test with completion in < 15s Baseline: 10/20/2021 INITIAL  5 Pt will improve on the TUG test form 20s to <or= 17s Baseline: 10/20/2021 INITIAL             PLAN: PT FREQUENCY: 1-2x/week  PT DURATION: 8 weeks  PLANNED INTERVENTIONS: Therapeutic exercises, Therapeutic activity, Neuro Muscular re-education, Balance training, Gait training, Patient/Family education, Joint mobilization, Stair training, Aquatic Therapy, Taping, and Manual therapy  PLAN FOR NEXT SESSION: Intro to aquatics, strengthening, stair climbing, gait and endurance training.   Tahni Porchia [facility]) Kip Cropp MPT 08/25/2021, 12:54 PM

## 2021-08-25 ENCOUNTER — Other Ambulatory Visit: Payer: Self-pay

## 2021-08-25 ENCOUNTER — Ambulatory Visit (HOSPITAL_BASED_OUTPATIENT_CLINIC_OR_DEPARTMENT_OTHER): Payer: Medicare Other | Attending: Physical Medicine and Rehabilitation | Admitting: Physical Therapy

## 2021-08-25 ENCOUNTER — Encounter (HOSPITAL_BASED_OUTPATIENT_CLINIC_OR_DEPARTMENT_OTHER): Payer: Self-pay | Admitting: Physical Therapy

## 2021-08-25 DIAGNOSIS — M6281 Muscle weakness (generalized): Secondary | ICD-10-CM | POA: Diagnosis not present

## 2021-08-25 DIAGNOSIS — M48062 Spinal stenosis, lumbar region with neurogenic claudication: Secondary | ICD-10-CM | POA: Diagnosis not present

## 2021-08-25 DIAGNOSIS — M79673 Pain in unspecified foot: Secondary | ICD-10-CM | POA: Insufficient documentation

## 2021-08-25 DIAGNOSIS — R262 Difficulty in walking, not elsewhere classified: Secondary | ICD-10-CM | POA: Insufficient documentation

## 2021-09-03 ENCOUNTER — Encounter (HOSPITAL_BASED_OUTPATIENT_CLINIC_OR_DEPARTMENT_OTHER): Payer: Self-pay | Admitting: Physical Therapy

## 2021-09-03 ENCOUNTER — Other Ambulatory Visit: Payer: Self-pay

## 2021-09-03 ENCOUNTER — Ambulatory Visit (HOSPITAL_BASED_OUTPATIENT_CLINIC_OR_DEPARTMENT_OTHER): Payer: Medicare Other | Attending: Physical Medicine and Rehabilitation | Admitting: Physical Therapy

## 2021-09-03 DIAGNOSIS — M6281 Muscle weakness (generalized): Secondary | ICD-10-CM | POA: Diagnosis present

## 2021-09-03 DIAGNOSIS — G610 Guillain-Barre syndrome: Secondary | ICD-10-CM | POA: Diagnosis present

## 2021-09-03 DIAGNOSIS — R262 Difficulty in walking, not elsewhere classified: Secondary | ICD-10-CM

## 2021-09-03 NOTE — Therapy (Signed)
OUTPATIENT PHYSICAL THERAPY TREATMENT NOTE   Patient Name: Angela Morales MRN: [id number] DOB:December 30, 1945, 76 y.o., female Today's Date: 09/03/2021  PCP: Deland Pretty, MD REFERRING PROVIDER: Deland Pretty, MD   PT End of Session - 09/03/21 1059     Visit Number 2    Number of Visits 16    Date for PT Re-Evaluation 10/20/21    Authorization Time Period 08/25/21 - 10/20/21    PT Start Time 1032    PT Stop Time 1113    PT Time Calculation (min) 41 min    Activity Tolerance Patient tolerated treatment well    Behavior During Therapy WFL for tasks assessed/performed             Past Medical History:  Diagnosis Date   Abnormal uterine bleeding    on HRT   Allergy    seasonal   Arthritis    Atrial fibrillation (HCC)    Celiac disease    DDD (degenerative disc disease)    Dysrhythmia    GBS (Guillain-Barre syndrome) (HCC)    Glaucoma    Microscopic colitis    Osteoarthritis of both knees    PONV (postoperative nausea and vomiting)    SVT (supraventricular tachycardia) ([place])    Past Surgical History:  Procedure Laterality Date   APPENDECTOMY     ATRIAL FIBRILLATION ABLATION N/A 01/28/2021   Procedure: ATRIAL FIBRILLATION ABLATION;  Surgeon: Constance Haw, MD;  Location: [place] [facility];  Service: Cardiovascular;  Laterality: N/A;   CARDIOVERSION N/A 10/30/2020   Procedure: CARDIOVERSION;  Surgeon: Jerline Pain, MD;  Location: [facility] ENDOSCOPY;  Service: Cardiovascular;  Laterality: N/A;   CARDIOVERSION N/A 03/16/2021   Procedure: CARDIOVERSION;  Surgeon: Thayer Headings, MD;  Location: [facility] ENDOSCOPY;  Service: Cardiovascular;  Laterality: N/A;   COLONOSCOPY     FOOT FRACTURE SURGERY  10/2013   TONSILLECTOMY AND ADENOIDECTOMY     TOTAL KNEE ARTHROPLASTY Left 03/22/2016   Procedure: TOTAL KNEE ARTHROPLASTY;  Surgeon: Dorna Leitz, MD;  Location: [place];  Service: Orthopedics;  Laterality: Left;   TUBAL LIGATION  1981   VAGINAL HYSTERECTOMY  04/20/05   prolapse,  adenomyosis   Patient Active Problem List   Diagnosis Date Noted   Abnormality of gait 08/14/2021   Left foot drop 08/14/2021   Wheelchair dependence 05/15/2021   Neurogenic bladder 04/09/2021   Atrial flutter with rapid ventricular response ([place]) 03/13/2021   Urinary retention    Chronic pain syndrome    GBS (Guillain-Barre syndrome) ([place]) 03/04/2021   Sleep disturbance    Atelectasis    Malnutrition of moderate degree 02/16/2021   Elevated white blood cell count    Status post tracheostomy ([place])    Oropharyngeal dysphagia    Acute respiratory failure with hypoxia (HCC)    GBS (Guillain Barre syndrome) ([place]) 02/06/2021   Chronic anticoagulation 02/06/2021   Hyponatremia 02/06/2021   Prolonged QT interval 02/06/2021   Generalized weakness 02/06/2021   Secondary hypercoagulable state ([place]) 11/13/2020   Primary osteoarthritis of left knee 03/22/2016   Atrial fibrillation with RVR ([place]) 12/01/2013   Paroxysmal atrial fibrillation ([place]) 11/30/2013   Hypotension 11/30/2013   Atrial flutter ([place]) 11/30/2013    REFERRING DIAG: T02.409 (ICD-10-CM) - Spinal stenosis of lumbar region with neurogenic claudication  THERAPY DIAG:  Muscle weakness (generalized)  Difficulty in walking, not elsewhere classified  PERTINENT HISTORY:  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  PRECAUTIONS: resolving  foot drop L>R  SUBJECTIVE: "Feeling pretty good.  Glad to finally be here"   PAIN:  Are you having pain? No NPRS scale: 0/10 Pain location: LB and hips with radiation rle Pain orientation: Right and Lower  PAIN TYPE: aching Pain description: intermittent  Aggravating factors: steps, increased activity level. Relieving factors: rest   PRECAUTIONS: Other: resolving foot drop L>R   WEIGHT BEARING RESTRICTIONS No   FALLS:  Has patient fallen in last 6 months? No,    LIVING ENVIRONMENT: Lives with: lives with their  family Lives in: House/apartment Stairs: Yes; External: 15 steps; on right going up Has following equipment at home: Single point cane, Walker - 2 wheeled, Wheelchair (manual), and Electronics engineer   OCCUPATION: retired   PLOF: Independent prior to GBS Aug 2022   PATIENT GOALS walk indep, car for myself, return to PLOF     OBJECTIVE:        SCREENING FOR RED FLAGS: Bowel or bladder incontinence: No Spinal tumors: No Cauda equina syndrome: No Compression fracture: No Abdominal aneurysm: No   COGNITION:          Overall cognitive status: Within functional limits for tasks assessed                        SENSATION:          Light touch: Deficits Left distal foot decreased sensation.  Wears Left ankle inversion brace              MUSCLE LENGTH: Hamstrings: WFL     POSTURE:  Forward hip flex, asymmetry: left hip elevation, right shoulder elevation       LE AROM/PROM:   A/PROM Right 08/25/2021 Left 08/25/2021  Hip flexion      Hip extension      Hip abduction      Hip adduction      Hip internal rotation      Hip external rotation      Knee flexion      Knee extension      Ankle dorsiflexion 5 -10  Ankle plantarflexion full full  Ankle inversion      Ankle eversion       (Blank rows = not tested)   LE MMT:   MMT Right 08/25/2021 Left 08/25/2021  Hip flexion 3+ 3-  Hip extension      Hip abduction 4 4  Hip adduction 4 4  Hip internal rotation      Hip external rotation      Knee flexion 4 4  Knee extension 4 4  Ankle dorsiflexion 4 3  Ankle plantarflexion 4+ 3  Ankle inversion      Ankle eversion       (Blank rows = not tested)   Straight leg raise test: Negative and Slump test: Negative   FUNCTIONAL TESTS:  5 times sit to stand: 21 using ue from transport chair Timed up and go (TUG): 20s                     6 minute walk test  566f GAIT: Distance walked: 550 ft Assistive device utilized: Walker - 2 wheeled Level of assistance: CGA Comments:  Forward hip flex, limited heel strike bilaterally R<L, decreased step length and cadence.       TODAY'S TREATMENT  Pt seen for aquatic therapy today.  Treatment took place in water 3.25-4.[facility] Temp of water  was 94 .  Pt entered/exited the pool via stairs step to pattern independently with bilat rail. Introduction to setting  Warm up: Walking forward, back and sidestepping in 64f x 3 widths each  Stretching -seated active and assisted gastroc and hamstring stretching -standing gastroc stretch bottom step -standing hip flex and quad stretch   Seated Exercise: instruction on abdominal bracing -flutter kicking (SLR) 3x20 -add/abd 3x20 -STS from 3rd (bottom) step.  VC, manual assist and demonstration for proper execution. X10  Standing -step ups leading with R/L x 7 ea.  Demo and vc for proper execution.  Pt require bilat UE support of hand rails.  Side stepping 2 widths cues for increased step length.    Pt requires buoyancy for support and to offload joints with strengthening exercises. Viscosity of the water is needed for resistance of strengthening; water current perturbations provides challenge to standing balance unsupported, requiring increased core activation.      PATIENT EDUCATION:  Education details: Properties of water, benefits of aquatic therapy Person educated: Patient and Caregiver Education method: Explanation Education comprehension: verbalized understanding     HOME EXERCISE PROGRAM: To be assigned next visit aquatic.   ASSESSMENT:   CLINICAL IMPRESSION: Pt introduced to setting. She is familiar, indep and confident submerged.  Focus in session today on stretching and initiation of strengthening exercises. She requires moderate cues for execution as well as explination for concentration on target muscles and purpose. Pt with high fatigability needing 3-4 recovery periods.  Has cramping of left ankle into inversion  with increasing fatigue.  She is able to counter with pausing movement and placing body weight through LLE. Anterior chain tightness appreciated (hip flex/quads, abdominal) today.  She tolerates session well. Will benefit from skilled PT in progression of stretching and strengthening over time throughout extremities and core to return to PLOF. Pt will continue to require increased recovery times.    Patient is a 76y.o. female who was seen today for physical therapy evaluation and treatment for muscle weakness and difficulty in walking due to GBS which pt is recovering from with onset 04/18/22. Pt reports complete loss of ability to walk within 8 hours of onset.  She has been recovering since.  Had home care for many months.  [facility] has been getting into pool 2 x weekly. She has regained the ability to walk using a front wheeled walker ambulating in home, using transport chair when leaving home.  She continues to have strength deficits throughout. Foot drop is resolving although left ankle with slight inversion contracture which she is braced for.  She no longer has any pain associated with GBS but does have intermittent LBP from lumbar stenosis.  She also reports having a slight scoliosis. She has begun driving in parking lots with CG. She will benefit from aquatic therapy to hasten and enhance progress towards goals.         OBJECTIVE IMPAIRMENTS Abnormal gait, decreased activity tolerance, decreased balance, decreased endurance, decreased mobility, difficulty walking, decreased ROM, decreased strength, impaired sensation, and pain.    ACTIVITY LIMITATIONS cleaning, community activity, driving, meal prep, laundry, and shopping.    PERSONAL FACTORS Age and 3+ comorbidities:    are also affecting patient's functional outcome.      REHAB POTENTIAL: Good   CLINICAL DECISION MAKING: Evolving/moderate complexity   EVALUATION COMPLEXITY: Moderate      GOALS: Goals reviewed with patient? Yes   SHORT TERM GOALS:   STG Name  Target Date Goal status  1 Pt will tolerate full sessions of aquatic therapy without reports of excessive fatigue or increase pain Baseline:  09/15/2021 INITIAL  2 Pt will be indep and compliant with initial aquatic HEP Baseline:  09/15/2021 INITIAL  3 Pt will tolerate amb one way (to or from) pool Baseline: 09/22/2021 INITIAL  4 Pt will be able to walk 10 continuous minutes in pool (without rest period) Baseline: 09/22/2021 INITIAL                               LONG TERM GOALS:    LTG Name Target Date Goal status  1 Pt will be able to climb 15 steps to access her second floor of home Baseline: 10/20/2021 INITIAL  2 Pt will be able to tolerate walking to and from pool for therapy Baseline: 10/20/2021 INITIAL  3 Pt will improve on the 6 minute walk test ambulating > 800 ft with AD as needed Baseline: 10/20/2021 INITIAL  4 Pt will improve on the 5 x STS test with completion in < 15s Baseline: 10/20/2021 INITIAL  5 Pt will improve on the TUG test form 20s to <or= 17s Baseline: 10/20/2021 INITIAL                      PLAN: PT FREQUENCY: 1-2x/week   PT DURATION: 8 weeks   PLANNED INTERVENTIONS: Therapeutic exercises, Therapeutic activity, Neuro Muscular re-education, Balance training, Gait training, Patient/Family education, Joint mobilization, Stair training, Aquatic Therapy, Taping, and Manual therapy   PLAN FOR NEXT SESSION: stretching, strengthening, stair climbing, gait and endurance training.      Vedha Tercero Tharon Aquas) Amine Adelson MPT 09/03/2021, 2:34 PM

## 2021-09-10 ENCOUNTER — Ambulatory Visit (HOSPITAL_BASED_OUTPATIENT_CLINIC_OR_DEPARTMENT_OTHER): Payer: Medicare Other | Admitting: Physical Therapy

## 2021-09-10 ENCOUNTER — Other Ambulatory Visit: Payer: Self-pay

## 2021-09-10 ENCOUNTER — Encounter (HOSPITAL_BASED_OUTPATIENT_CLINIC_OR_DEPARTMENT_OTHER): Payer: Self-pay | Admitting: Physical Therapy

## 2021-09-10 DIAGNOSIS — M6281 Muscle weakness (generalized): Secondary | ICD-10-CM | POA: Diagnosis not present

## 2021-09-10 NOTE — Therapy (Signed)
?OUTPATIENT PHYSICAL THERAPY TREATMENT NOTE ? ? ?Patient Name: Angela Morales ?MRN: [id number] ?DOB:1945/08/16, 76 y.o., female ?Today's Date: 09/10/2021 ? ?PCP: Deland Pretty, MD ?REFERRING PROVIDER: Deland Pretty, MD ? ? PT End of Session - 09/10/21 1053   ? ? Visit Number 3   ? Number of Visits 16   ? Date for PT Re-Evaluation 10/20/21   ? Authorization Time Period 08/25/21 - 10/20/21   ? PT Start Time 1034   ? PT Stop Time 1115   ? PT Time Calculation (min) 41 min   ? Activity Tolerance Patient tolerated treatment well   ? Behavior During Therapy [facility] for tasks assessed/performed   ? ?  ?  ? ?  ? ? ?Past Medical History:  ?Diagnosis Date  ? Abnormal uterine bleeding   ? on HRT  ? Allergy   ? seasonal  ? Arthritis   ? Atrial fibrillation ([place])   ? Celiac disease   ? DDD (degenerative disc disease)   ? Dysrhythmia   ? GBS (Guillain-Barre syndrome) ([place])   ? Glaucoma   ? Microscopic colitis   ? Osteoarthritis of both knees   ? PONV (postoperative nausea and vomiting)   ? SVT (supraventricular tachycardia) ([place])   ? ?Past Surgical History:  ?Procedure Laterality Date  ? APPENDECTOMY    ? ATRIAL FIBRILLATION ABLATION N/A 01/28/2021  ? Procedure: ATRIAL FIBRILLATION ABLATION;  Surgeon: Constance Haw, MD;  Location: [place] [facility];  Service: Cardiovascular;  Laterality: N/A;  ? CARDIOVERSION N/A 10/30/2020  ? Procedure: CARDIOVERSION;  Surgeon: Jerline Pain, MD;  Location: [facility] ENDOSCOPY;  Service: Cardiovascular;  Laterality: N/A;  ? CARDIOVERSION N/A 03/16/2021  ? Procedure: CARDIOVERSION;  Surgeon: Thayer Headings, MD;  Location: [facility] ENDOSCOPY;  Service: Cardiovascular;  Laterality: N/A;  ? COLONOSCOPY    ? FOOT FRACTURE SURGERY  10/2013  ? TONSILLECTOMY AND ADENOIDECTOMY    ? TOTAL KNEE ARTHROPLASTY Left 03/22/2016  ? Procedure: TOTAL KNEE ARTHROPLASTY;  Surgeon: Dorna Leitz, MD;  Location: [place];  Service: Orthopedics;  Laterality: Left;  ? TUBAL LIGATION  1981  ? VAGINAL HYSTERECTOMY  04/20/05  ? prolapse,  adenomyosis  ? ?Patient Active Problem List  ? Diagnosis Date Noted  ? Abnormality of gait 08/14/2021  ? Left foot drop 08/14/2021  ? Wheelchair dependence 05/15/2021  ? Neurogenic bladder 04/09/2021  ? Atrial flutter with rapid ventricular response ([place]) 03/13/2021  ? Urinary retention   ? Chronic pain syndrome   ? GBS (Guillain-Barre syndrome) ([place]) 03/04/2021  ? Sleep disturbance   ? Atelectasis   ? Malnutrition of moderate degree 02/16/2021  ? Elevated white blood cell count   ? Status post tracheostomy ([place])   ? Oropharyngeal dysphagia   ? Acute respiratory failure with hypoxia ([place])   ? GBS (Guillain Barre syndrome) ([place]) 02/06/2021  ? Chronic anticoagulation 02/06/2021  ? Hyponatremia 02/06/2021  ? Prolonged QT interval 02/06/2021  ? Generalized weakness 02/06/2021  ? Secondary hypercoagulable state ([place]) 11/13/2020  ? Primary osteoarthritis of left knee 03/22/2016  ? Atrial fibrillation with RVR ([place]) 12/01/2013  ? Paroxysmal atrial fibrillation ([place]) 11/30/2013  ? Hypotension 11/30/2013  ? Atrial flutter ([place]) 11/30/2013  ? ? ?REFERRING DIAG: M48.062 (ICD-10-CM) - Spinal stenosis of lumbar region with neurogenic claudication ? ?THERAPY DIAG:  ?Muscle weakness (generalized) ? ?Difficulty in walking, not elsewhere classified ? ?GBS (Guillain-Barre syndrome) ([place]) ? ?PERTINENT HISTORY:  hx of Guillain Barre Syndrome/AIDP with B/L foot drop-  ? also has aflutter- on Eliquis,  spinal stenosis. ? ?PRECAUTIONS: resolving foot drop L>R ? ?SUBJECTIVE: "I was tired after last visit." "I have walked down here (parkinglot to pool) on Sat and again today also walked up my steps" ? ? ?PAIN:  ?Are you having pain? No ?NPRS scale: 0/10 ?Pain location: LB and hips with radiation rle ?Pain orientation: Right and Lower  ?PAIN TYPE: aching ?Pain description: intermittent  ?Aggravating factors: steps, increased activity level. ?Relieving factors: rest ?  ?PRECAUTIONS: Other: resolving foot drop L>R ?  ?WEIGHT BEARING  RESTRICTIONS No ?  ?FALLS:  ?Has patient fallen in last 6 months? No,  ?  ?LIVING ENVIRONMENT: ?Lives with: lives with their family ?Lives in: House/apartment ?Stairs: Yes; External: 15 steps; on right going up ?Has following equipment at home: Single point cane, Walker - 2 wheeled, Wheelchair (manual), and Electronics engineer ?  ?OCCUPATION: retired ?  ?PLOF: Independent prior to GBS Aug 2022 ?  ?PATIENT GOALS walk indep, car for myself, return to PLOF ?  ?  ?OBJECTIVE:  ?  ?  ?  ?SCREENING FOR RED FLAGS: ?Bowel or bladder incontinence: No ?Spinal tumors: No ?Cauda equina syndrome: No ?Compression fracture: No ?Abdominal aneurysm: No ?  ?COGNITION: ?         Overall cognitive status: Within functional limits for tasks assessed              ?          ?SENSATION: ?         Light touch: Deficits Left distal foot decreased sensation.  Wears Left ankle inversion brace  ?          ?  ?MUSCLE LENGTH: ?Hamstrings: WFL ?  ?  ?POSTURE:  ?Forward hip flex, asymmetry: left hip elevation, right shoulder elevation ?  ?  ?  ?LE AROM/PROM: ?  ?A/PROM Right ?08/25/2021 Left ?08/25/2021  ?Hip flexion      ?Hip extension      ?Hip abduction      ?Hip adduction      ?Hip internal rotation      ?Hip external rotation      ?Knee flexion      ?Knee extension      ?Ankle dorsiflexion 5 -10  ?Ankle plantarflexion full full  ?Ankle inversion      ?Ankle eversion      ? (Blank rows = not tested) ?  ?LE MMT: ?  ?MMT Right ?08/25/2021 Left ?08/25/2021  ?Hip flexion 3+ 3-  ?Hip extension      ?Hip abduction 4 4  ?Hip adduction 4 4  ?Hip internal rotation      ?Hip external rotation      ?Knee flexion 4 4  ?Knee extension 4 4  ?Ankle dorsiflexion 4 3  ?Ankle plantarflexion 4+ 3  ?Ankle inversion      ?Ankle eversion      ? (Blank rows = not tested) ?  ?Straight leg raise test: Negative and Slump test: Negative ?  ?FUNCTIONAL TESTS:  ?5 times sit to stand: 21 using ue from transport chair ?Timed up and go (TUG): 20s ?                    6 minute walk test   534f ?GAIT: ?Distance walked: 550 ft ?Assistive device utilized: WEnvironmental consultant- 2 wheeled ?Level of assistance: CGA ?Comments: Forward hip flex, limited heel strike bilaterally R<L, decreased step length and cadence. ?  ?  ?  ?TODAY'S TREATMENT  ?Pt seen for aquatic  therapy today.  Treatment took place in water 3.25-4.8 ft in depth at the [facility] pool. Temp of water was 94 ?Marland Kitchen  Pt entered/exited the pool via stairs step to pattern independently with bilat rail. ?Introduction to setting ? ?Warm up: Walking forward, back and sidestepping in 48f x 3 widths each ? ?Side stepping 2 widths added 1 foam buoys shoulder add/abd.  Pt unable to maintain path or balance due to lack of strength ? ?Standing ?Kick board push down cues for abdominal bracing and glut tightness 3x15  ? ?Stretching ?-standing gastroc and hamstring stretch bottom step ? ?Seated ?Exercise:  abdominal bracing ?-flutter kicking (SLR) 3x20 ?-add/abd 3x20 ?   -STS x 3 3rd (from bottom) step ? ?Pt requires buoyancy for support and to offload joints with strengthening exercises. Viscosity of the water is needed for resistance of strengthening; water current perturbations provides challenge to standing balance unsupported, requiring increased core activation. ? ?  ?  ?PATIENT EDUCATION:  ?Education details: Properties of water, benefits of aquatic therapy ?Person educated: Patient and Caregiver ?Education method: Explanation ?Education comprehension: verbalized understanding ?  ?  ?HOME EXERCISE PROGRAM: ? stair climbing 2-3 x week with assistance ?Walk distance to pool with each visit. ?HEP: gastroc and hamstring stretching; flutter and add/abd in seated 3x20, STS from 3rd step x5. ?  ?ASSESSMENT: ?  ?CLINICAL IMPRESSION: ?Pt reports increase in activities as discussed in previous sessions.  She is walking from parking lot to pool as well as has begun climbing stairs to 2nd floor of home.  CG present whom I reviewed proper guarding techniques with for  stair climbing. Pt reports compliance with aquatic exercises as completed last session.  She requires 3-4 recovery periods throughout today. She is progressing well towards goals. ? ? ? ? ? ?Patient is a 76y.o. fe

## 2021-09-16 ENCOUNTER — Ambulatory Visit (HOSPITAL_BASED_OUTPATIENT_CLINIC_OR_DEPARTMENT_OTHER): Payer: Medicare Other | Admitting: Physical Therapy

## 2021-09-16 ENCOUNTER — Other Ambulatory Visit: Payer: Self-pay

## 2021-09-16 ENCOUNTER — Encounter (HOSPITAL_BASED_OUTPATIENT_CLINIC_OR_DEPARTMENT_OTHER): Payer: Self-pay | Admitting: Physical Therapy

## 2021-09-16 DIAGNOSIS — G610 Guillain-Barre syndrome: Secondary | ICD-10-CM

## 2021-09-16 DIAGNOSIS — R262 Difficulty in walking, not elsewhere classified: Secondary | ICD-10-CM

## 2021-09-16 DIAGNOSIS — M6281 Muscle weakness (generalized): Secondary | ICD-10-CM | POA: Diagnosis not present

## 2021-09-16 NOTE — Therapy (Signed)
?OUTPATIENT PHYSICAL THERAPY TREATMENT NOTE ? ? ?Patient Name: Angela Morales ?MRN: [id number] ?DOB:February 26, 1946, 76 y.o., female ?Today's Date: 09/16/2021 ? ?PCP: Deland Pretty, MD ?REFERRING PROVIDER: Deland Pretty, MD ? ? PT End of Session - 09/16/21 1150   ? ? Visit Number 4   ? Number of Visits 16   ? Date for PT Re-Evaluation 10/20/21   ? Authorization Time Period 08/25/21 - 10/20/21   ? Progress Note Due on Visit 10   ? PT Start Time 1149   ? PT Stop Time 1230   ? PT Time Calculation (min) 41 min   ? Activity Tolerance Patient tolerated treatment well   ? Behavior During Therapy [facility] for tasks assessed/performed   ? ?  ?  ? ?  ? ? ?Past Medical History:  ?Diagnosis Date  ? Abnormal uterine bleeding   ? on HRT  ? Allergy   ? seasonal  ? Arthritis   ? Atrial fibrillation ([place])   ? Celiac disease   ? DDD (degenerative disc disease)   ? Dysrhythmia   ? GBS (Guillain-Barre syndrome) ([place])   ? Glaucoma   ? Microscopic colitis   ? Osteoarthritis of both knees   ? PONV (postoperative nausea and vomiting)   ? SVT (supraventricular tachycardia) ([place])   ? ?Past Surgical History:  ?Procedure Laterality Date  ? APPENDECTOMY    ? ATRIAL FIBRILLATION ABLATION N/A 01/28/2021  ? Procedure: ATRIAL FIBRILLATION ABLATION;  Surgeon: Constance Haw, MD;  Location: [facility];  Service: Cardiovascular;  Laterality: N/A;  ? CARDIOVERSION N/A 10/30/2020  ? Procedure: CARDIOVERSION;  Surgeon: Jerline Pain, MD;  [facility] ENDOSCOPY;  Service: Cardiovascular;  Laterality: N/A;  ? CARDIOVERSION N/A 03/16/2021  ? Procedure: CARDIOVERSION;  Surgeon: Thayer Headings, MD;  Location: [facility] ENDOSCOPY;  Service: Cardiovascular;  Laterality: N/A;  ? COLONOSCOPY    ? FOOT FRACTURE SURGERY  10/2013  ? TONSILLECTOMY AND ADENOIDECTOMY    ? TOTAL KNEE ARTHROPLASTY Left 03/22/2016  ? Procedure: TOTAL KNEE ARTHROPLASTY;  Surgeon: Dorna Leitz, MD;  Location: [facility];  Service: Orthopedics;  Laterality: Left;  ? TUBAL LIGATION  1981  ? VAGINAL  HYSTERECTOMY  04/20/05  ? prolapse, adenomyosis  ? ?Patient Active Problem List  ? Diagnosis Date Noted  ? Abnormality of gait 08/14/2021  ? Left foot drop 08/14/2021  ? Wheelchair dependence 05/15/2021  ? Neurogenic bladder 04/09/2021  ? Atrial flutter with rapid ventricular response ([place]) 03/13/2021  ? Urinary retention   ? Chronic pain syndrome   ? GBS (Guillain-Barre syndrome) ([place]) 03/04/2021  ? Sleep disturbance   ? Atelectasis   ? Malnutrition of moderate degree 02/16/2021  ? Elevated white blood cell count   ? Status post tracheostomy ([place])   ? Oropharyngeal dysphagia   ? Acute respiratory failure with hypoxia ([place])   ? GBS (Guillain Barre syndrome) ([place]) 02/06/2021  ? Chronic anticoagulation 02/06/2021  ? Hyponatremia 02/06/2021  ? Prolonged QT interval 02/06/2021  ? Generalized weakness 02/06/2021  ? Secondary hypercoagulable state ([place]) 11/13/2020  ? Primary osteoarthritis of left knee 03/22/2016  ? Atrial fibrillation with RVR ([place]) 12/01/2013  ? Paroxysmal atrial fibrillation ([place]) 11/30/2013  ? Hypotension 11/30/2013  ? Atrial flutter ([place]) 11/30/2013  ? ? ?REFERRING DIAG: M48.062 (ICD-10-CM) - Spinal stenosis of lumbar region with neurogenic claudication ? ?THERAPY DIAG:  ?Muscle weakness (generalized) ? ?Difficulty in walking, not elsewhere classified ? ?GBS (Guillain-Barre syndrome) ([place]) ? ?PERTINENT HISTORY:  hx of Guillain Barre Syndrome/AIDP with B/L  foot drop-  ? also has aflutter- on Eliquis, spinal stenosis. ? ?PRECAUTIONS: resolving foot drop L>R ? ?SUBJECTIVE: Pt reports she was a little sore after last session, but that has resolved.  She is using rollator more than WC.  Her caregiver is present at [place].  ? ? ?PAIN:  ?Are you having pain? yes ?NPRS scale: 2-3/10 ?Pain location: hands ?Pain orientation: bilat ?PAIN TYPE: aching ?Pain description:  ?Aggravating factors:  ?Relieving factors: rest ?  ?PRECAUTIONS: Other: resolving foot drop L>R ?  ?WEIGHT BEARING RESTRICTIONS No ?  ?FALLS:   ?Has patient fallen in last 6 months? No,  ?  ?LIVING ENVIRONMENT: ?Lives with: lives with their family ?Lives in: House/apartment ?Stairs: Yes; External: 15 steps; on right going up ?Has following equipment at home: Single point cane, Walker - 2 wheeled, Wheelchair (manual), and Electronics engineer ?  ?OCCUPATION: retired ?  ?PLOF: Independent prior to GBS Aug 2022 ?  ?PATIENT GOALS walk indep, car for myself, return to PLOF ?  ?  ?OBJECTIVE:  ? * all objective findings taken at eval unless otherwise noted.  ?  ?  ?SCREENING FOR RED FLAGS: ?Bowel or bladder incontinence: No ?Spinal tumors: No ?Cauda equina syndrome: No ?Compression fracture: No ?Abdominal aneurysm: No ?  ?COGNITION: ?         Overall cognitive status: Within functional limits for tasks assessed              ?          ?SENSATION: ?         Light touch: Deficits Left distal foot decreased sensation.  Wears Left ankle inversion brace  ?          ?  ?MUSCLE LENGTH: ?Hamstrings: WFL ?  ?  ?POSTURE:  ?Forward hip flex, asymmetry: left hip elevation, right shoulder elevation ?  ?  ?  ?LE AROM/PROM: ?  ?A/PROM Right ?08/25/2021 Left ?08/25/2021  ?Hip flexion      ?Hip extension      ?Hip abduction      ?Hip adduction      ?Hip internal rotation      ?Hip external rotation      ?Knee flexion      ?Knee extension      ?Ankle dorsiflexion 5 -10  ?Ankle plantarflexion full full  ?Ankle inversion      ?Ankle eversion      ? (Blank rows = not tested) ?  ?LE MMT: ?  ?MMT Right ?08/25/2021 Left ?08/25/2021  ?Hip flexion 3+ 3-  ?Hip extension      ?Hip abduction 4 4  ?Hip adduction 4 4  ?Hip internal rotation      ?Hip external rotation      ?Knee flexion 4 4  ?Knee extension 4 4  ?Ankle dorsiflexion 4 3  ?Ankle plantarflexion 4+ 3  ?Ankle inversion      ?Ankle eversion      ? (Blank rows = not tested) ?  ?Straight leg raise test: Negative and Slump test: Negative ?  ?FUNCTIONAL TESTS:  ?5 times sit to stand: 21 using ue from transport chair ?Timed up and go (TUG): 20s ?                     6 minute walk test  533f ?GAIT: ?Distance walked: 550 ft ?Assistive device utilized: WEnvironmental consultant- 2 wheeled ?Level of assistance: CGA ?Comments: Forward hip flex, limited heel strike bilaterally R<L, decreased step length and cadence. ?  ?  ?  ?  TODAY'S TREATMENT  ?3/22: ?Pt seen for aquatic therapy today.  Treatment took place in water 3.25-4.40f [facility] Temp of water was 92?.  Pt entered/exited the pool via stairs step to pattern independently with bilat rail. ? ?Warm up: Walking forward, back and sidestepping in 432fx 3 widths each holding noodle/yellow dumbbells ?Seated on bench with step under feet: ?-flutter kicking (SLR) x 20 ?-add/abd  x10 ?   -STS x 5, 2 sets ?   Standing:  ?Holding wall: Squats x 10 ?   Forward step ups x 5 reps each leg, 2 sets ?   Hip circles 5-10 reps CW/CCW ?   Heel raises x 15 ?   Runners stretch x 20 sec each ?   No support: Side lunge/reach and return to neutral x 5 each ?   Front lunge/reach and return to neutral x  5 each ?   Ab set pushing kickboard under water (vertical/ horiz) x 3-5 sec x 15 rep ? ?Pt requires buoyancy for support and to offload joints with strengthening exercises. Viscosity of the water is needed for resistance of strengthening; water current perturbations provides challenge to standing balance unsupported, requiring increased core activation. ? ?  ?  ?PATIENT EDUCATION:  ?Education details: Properties of water, benefits of aquatic therapy ?Person educated: Patient and Caregiver ?Education method: Explanation ?Education comprehension: verbalized understanding ?  ?  ?HOME EXERCISE PROGRAM: ? stair climbing 2-3 x week with assistance ?Walk distance to pool with each visit. ?HEP: gastroc and hamstring stretching; flutter and add/abd in seated 3x20, STS from 3rd step x5. ?  ?ASSESSMENT: ?  ?CLINICAL IMPRESSION: ?Pt tolerated exercises well, independently in water receiving instruction/ cues from therapist on deck.   She had minor losses of balance, but was able to self correct independently.  Minor cues for more upright posture. She requires 3-4 short seated/standing recovery periods throughout session, initiated by pt

## 2021-09-23 ENCOUNTER — Ambulatory Visit (HOSPITAL_BASED_OUTPATIENT_CLINIC_OR_DEPARTMENT_OTHER): Payer: Medicare Other | Admitting: Physical Therapy

## 2021-09-23 ENCOUNTER — Encounter (HOSPITAL_BASED_OUTPATIENT_CLINIC_OR_DEPARTMENT_OTHER): Payer: Self-pay | Admitting: Physical Therapy

## 2021-09-23 ENCOUNTER — Other Ambulatory Visit: Payer: Self-pay

## 2021-09-23 DIAGNOSIS — M6281 Muscle weakness (generalized): Secondary | ICD-10-CM

## 2021-09-23 DIAGNOSIS — R262 Difficulty in walking, not elsewhere classified: Secondary | ICD-10-CM

## 2021-09-23 DIAGNOSIS — G610 Guillain-Barre syndrome: Secondary | ICD-10-CM

## 2021-09-23 NOTE — Therapy (Signed)
?OUTPATIENT PHYSICAL THERAPY TREATMENT NOTE ? ? ?Patient Name: Angela Morales ?MRN: [id number] ?DOB:March 01, 1946, 76 y.o., female ?Today's Date: 09/23/2021 ? ?PCP: Deland Pretty, MD ?REFERRING PROVIDER: Deland Pretty, MD ? ? PT End of Session - 09/23/21 0904   ? ? Visit Number 5   ? Number of Visits 16   ? Date for PT Re-Evaluation 10/20/21   ? Authorization Time Period 08/25/21 - 10/20/21   ? PT Start Time 0901   ? PT Stop Time 4742   ? PT Time Calculation (min) 44 min   ? Activity Tolerance Patient tolerated treatment well   ? Behavior During Therapy [facility] for tasks assessed/performed   ? ?  ?  ? ?  ? ? ?Past Medical History:  ?Diagnosis Date  ? Abnormal uterine bleeding   ? on HRT  ? Allergy   ? seasonal  ? Arthritis   ? Atrial fibrillation ([place])   ? Celiac disease   ? DDD (degenerative disc disease)   ? Dysrhythmia   ? GBS (Guillain-Barre syndrome) ([place])   ? Glaucoma   ? Microscopic colitis   ? Osteoarthritis of both knees   ? PONV (postoperative nausea and vomiting)   ? SVT (supraventricular tachycardia) ([place])   ? ?Past Surgical History:  ?Procedure Laterality Date  ? APPENDECTOMY    ? ATRIAL FIBRILLATION ABLATION N/A 01/28/2021  ? Procedure: ATRIAL FIBRILLATION ABLATION;  Surgeon: Constance Haw, MD;  Location: [facility];  Service: Cardiovascular;  Laterality: N/A;  ? CARDIOVERSION N/A 10/30/2020  ? Procedure: CARDIOVERSION;  Surgeon: Jerline Pain, MD;  Location: [facility] ENDOSCOPY;  Service: Cardiovascular;  Laterality: N/A;  ? CARDIOVERSION N/A 03/16/2021  ? Procedure: CARDIOVERSION;  Surgeon: Thayer Headings, MD;  Location: [facility] ENDOSCOPY;  Service: Cardiovascular;  Laterality: N/A;  ? COLONOSCOPY    ? FOOT FRACTURE SURGERY  10/2013  ? TONSILLECTOMY AND ADENOIDECTOMY    ? TOTAL KNEE ARTHROPLASTY Left 03/22/2016  ? Procedure: TOTAL KNEE ARTHROPLASTY;  Surgeon: Dorna Leitz, MD;  Location: [facility];  Service: Orthopedics;  Laterality: Left;  ? TUBAL LIGATION  1981  ? VAGINAL HYSTERECTOMY  04/20/05  ? prolapse,  adenomyosis  ? ?Patient Active Problem List  ? Diagnosis Date Noted  ? Abnormality of gait 08/14/2021  ? Left foot drop 08/14/2021  ? Wheelchair dependence 05/15/2021  ? Neurogenic bladder 04/09/2021  ? Atrial flutter with rapid ventricular response ([place]) 03/13/2021  ? Urinary retention   ? Chronic pain syndrome   ? GBS (Guillain-Barre syndrome) ([place]) 03/04/2021  ? Sleep disturbance   ? Atelectasis   ? Malnutrition of moderate degree 02/16/2021  ? Elevated white blood cell count   ? Status post tracheostomy ([place])   ? Oropharyngeal dysphagia   ? Acute respiratory failure with hypoxia ([place])   ? GBS (Guillain Barre syndrome) ([place]) 02/06/2021  ? Chronic anticoagulation 02/06/2021  ? Hyponatremia 02/06/2021  ? Prolonged QT interval 02/06/2021  ? Generalized weakness 02/06/2021  ? Secondary hypercoagulable state ([place]) 11/13/2020  ? Primary osteoarthritis of left knee 03/22/2016  ? Atrial fibrillation with RVR ([place]) 12/01/2013  ? Paroxysmal atrial fibrillation ([place]) 11/30/2013  ? Hypotension 11/30/2013  ? Atrial flutter ([place]) 11/30/2013  ? ? ?REFERRING DIAG: M48.062 (ICD-10-CM) - Spinal stenosis of lumbar region with neurogenic claudication ? ?THERAPY DIAG:  ?Muscle weakness (generalized) ? ?Difficulty in walking, not elsewhere classified ? ?GBS (Guillain-Barre syndrome) ([place]) ? ?PERTINENT HISTORY:  hx of Guillain Barre Syndrome/AIDP with B/L foot drop-  ? also has aflutter- on Eliquis,  spinal stenosis. ? ?PRECAUTIONS: resolving foot drop L>R ? ?SUBJECTIVE: "walking back and forth from pool, climbing stairs x3 weekly and have walked through grocery store twice" ? ? ?PAIN:  ?Are you having pain? yes ?NPRS scale: 2-3/10 ?Pain location: hands ?Pain orientation: bilat ?PAIN TYPE: aching ?Pain description:  ?Aggravating factors:  ?Relieving factors: rest ?  ?PRECAUTIONS: Other: resolving foot drop L>R ?  ?WEIGHT BEARING RESTRICTIONS No ?  ?FALLS:  ?Has patient fallen in last 6 months? No,  ?  ?LIVING ENVIRONMENT: ?Lives with:  lives with their family ?Lives in: House/apartment ?Stairs: Yes; External: 15 steps; on right going up ?Has following equipment at home: Single point cane, Walker - 2 wheeled, Wheelchair (manual), and Electronics engineer ?  ?OCCUPATION: retired ?  ?PLOF: Independent prior to GBS Aug 2022 ?  ?PATIENT GOALS walk indep, car for myself, return to PLOF ?  ?  ?OBJECTIVE:  ? * all objective findings taken at eval unless otherwise noted.  ?  ?  ?SCREENING FOR RED FLAGS: ?Bowel or bladder incontinence: No ?Spinal tumors: No ?Cauda equina syndrome: No ?Compression fracture: No ?Abdominal aneurysm: No ?  ?COGNITION: ?         Overall cognitive status: Within functional limits for tasks assessed              ?          ?SENSATION: ?         Light touch: Deficits Left distal foot decreased sensation.  Wears Left ankle inversion brace  ?          ?  ?MUSCLE LENGTH: ?Hamstrings: WFL ?  ?  ?POSTURE:  ?Forward hip flex, asymmetry: left hip elevation, right shoulder elevation ?  ?  ?  ?LE AROM/PROM: ?  ?A/PROM Right ?08/25/2021 Left ?08/25/2021  ?Hip flexion      ?Hip extension      ?Hip abduction      ?Hip adduction      ?Hip internal rotation      ?Hip external rotation      ?Knee flexion      ?Knee extension      ?Ankle dorsiflexion 5 -10  ?Ankle plantarflexion full full  ?Ankle inversion      ?Ankle eversion      ? (Blank rows = not tested) ?  ?LE MMT: ?  ?MMT Right ?08/25/2021 Left ?08/25/2021  ?Hip flexion 3+ 3-  ?Hip extension      ?Hip abduction 4 4  ?Hip adduction 4 4  ?Hip internal rotation      ?Hip external rotation      ?Knee flexion 4 4  ?Knee extension 4 4  ?Ankle dorsiflexion 4 3  ?Ankle plantarflexion 4+ 3  ?Ankle inversion      ?Ankle eversion      ? (Blank rows = not tested) ?  ?Straight leg raise test: Negative and Slump test: Negative ?  ?FUNCTIONAL TESTS:  ?5 times sit to stand: 21 using ue from transport chair ?Timed up and go (TUG): 20s ?                    6 minute walk test  564f ?GAIT: ?Distance walked: 550  ft ?Assistive device utilized: WEnvironmental consultant- 2 wheeled ?Level of assistance: CGA ?Comments: Forward hip flex, limited heel strike bilaterally R<L, decreased step length and cadence. ?  ?  ?  ?TODAY'S TREATMENT  ?09/23/21 ?Pt seen for aquatic therapy today.  Treatment took place [facility]  3.25-4.73f [facility] 92?.  Pt entered/exited the pool via stairs step to pattern independently with bilat rail. ? ?Warm up: Walking forward, back and sidestepping in 442fx 4 widths each holding noodle/yellow dumbbells ?Standing  ?-kick board push downs in 4 ft using forearms to reduce stress on hands and wrists (OA) ?-Hip circles 10 reps CW/CCW ?-Heel raises/toe raises x 15 ?-Forward step ups x 10 reps each leg; SL step up (TKE) x10 R/L on bottom step ? ?Seated on bench with step under feet: ?-flutter kicking (SLR) 2 x 20 ?-add/abd  3x10 ?_STS from 3rd step (bottom) x8. ?    ?  ?Pt requires buoyancy for support and to offload joints with strengthening exercises. Viscosity of the water is needed for resistance of strengthening; water current perturbations provides challenge to standing balance unsupported, requiring increased core activation. ? ?  ?  ?PATIENT EDUCATION:  ?Education details: Properties of water, benefits of aquatic therapy ?Person educated: Patient and Caregiver ?Education method: Explanation ?Education comprehension: verbalized understanding ?  ?  ?HOME EXERCISE PROGRAM: ? stair climbing 2-3 x week with assistance ?Walk distance to pool with each visit. ?HEP: gastroc and hamstring stretching; flutter and add/abd in seated 3x20, STS from 3rd step x5. ? ?  ?ASSESSMENT: ?  ?CLINICAL IMPRESSION: ?Good response from last session. C/o some hand and wrist discomfort  from use of kick bord and hand buoys due to OA.  Modified use and added aqua jogger hand mitts for resistance.  Pt requires vc and demonstration throughout session for proper execution of tasks and min assist with 5/8  STS for immediate standing balance.  She does report some increase in LBP requiring seated rest period with added side step ups for VMO and quad strengthening. Pt progressing well with skilled therapy increasing h

## 2021-09-29 ENCOUNTER — Encounter (HOSPITAL_BASED_OUTPATIENT_CLINIC_OR_DEPARTMENT_OTHER): Payer: Self-pay | Admitting: Physical Therapy

## 2021-09-29 ENCOUNTER — Ambulatory Visit (HOSPITAL_BASED_OUTPATIENT_CLINIC_OR_DEPARTMENT_OTHER): Payer: Medicare Other | Attending: Physical Medicine and Rehabilitation | Admitting: Physical Therapy

## 2021-09-29 DIAGNOSIS — G610 Guillain-Barre syndrome: Secondary | ICD-10-CM | POA: Diagnosis present

## 2021-09-29 DIAGNOSIS — M6281 Muscle weakness (generalized): Secondary | ICD-10-CM

## 2021-09-29 DIAGNOSIS — R262 Difficulty in walking, not elsewhere classified: Secondary | ICD-10-CM | POA: Diagnosis present

## 2021-09-29 NOTE — Therapy (Signed)
?OUTPATIENT PHYSICAL THERAPY TREATMENT NOTE ? ? ?Patient Name: Angela Morales ?MRN: [id number] ?DOB:24-Jun-1946, 76 y.o., female ?Today's Date: 09/29/2021 ? ?PCP: Deland Pretty, MD ?REFERRING PROVIDER: Deland Pretty, MD ? ? PT End of Session - 09/29/21 1130   ? ? Visit Number 6   ? Number of Visits 16   ? Date for PT Re-Evaluation 10/20/21   ? Authorization Time Period 08/25/21 - 10/20/21   ? PT Start Time 1116   ? PT Stop Time 1200   ? PT Time Calculation (min) 44 min   ? Activity Tolerance Patient tolerated treatment well   ? Behavior During Therapy [facility] for tasks assessed/performed   ? ?  ?  ? ?  ? ? ?Past Medical History:  ?Diagnosis Date  ? Abnormal uterine bleeding   ? on HRT  ? Allergy   ? seasonal  ? Arthritis   ? Atrial fibrillation ([place])   ? Celiac disease   ? DDD (degenerative disc disease)   ? Dysrhythmia   ? GBS (Guillain-Barre syndrome) ([place])   ? Glaucoma   ? Microscopic colitis   ? Osteoarthritis of both knees   ? PONV (postoperative nausea and vomiting)   ? SVT (supraventricular tachycardia) ([place])   ? ?Past Surgical History:  ?Procedure Laterality Date  ? APPENDECTOMY    ? ATRIAL FIBRILLATION ABLATION N/A 01/28/2021  ? Procedure: ATRIAL FIBRILLATION ABLATION;  Surgeon: Constance Haw, MD;  Location: [place] [facility];  Service: Cardiovascular;  Laterality: N/A;  ? CARDIOVERSION N/A 10/30/2020  ? Procedure: CARDIOVERSION;  Surgeon: Jerline Pain, MD;  Location: [facility] ENDOSCOPY;  Service: Cardiovascular;  Laterality: N/A;  ? CARDIOVERSION N/A 03/16/2021  ? Procedure: CARDIOVERSION;  Surgeon: Thayer Headings, MD;  Location: [facility] ENDOSCOPY;  Service: Cardiovascular;  Laterality: N/A;  ? COLONOSCOPY    ? FOOT FRACTURE SURGERY  10/2013  ? TONSILLECTOMY AND ADENOIDECTOMY    ? TOTAL KNEE ARTHROPLASTY Left 03/22/2016  ? Procedure: TOTAL KNEE ARTHROPLASTY;  Surgeon: Dorna Leitz, MD;  Location: [place];  Service: Orthopedics;  Laterality: Left;  ? TUBAL LIGATION  1981  ? VAGINAL HYSTERECTOMY  04/20/05  ? prolapse,  adenomyosis  ? ?Patient Active Problem List  ? Diagnosis Date Noted  ? Abnormality of gait 08/14/2021  ? Left foot drop 08/14/2021  ? Wheelchair dependence 05/15/2021  ? Neurogenic bladder 04/09/2021  ? Atrial flutter with rapid ventricular response ([place]) 03/13/2021  ? Urinary retention   ? Chronic pain syndrome   ? GBS (Guillain-Barre syndrome) ([place]) 03/04/2021  ? Sleep disturbance   ? Atelectasis   ? Malnutrition of moderate degree 02/16/2021  ? Elevated white blood cell count   ? Status post tracheostomy ([place])   ? Oropharyngeal dysphagia   ? Acute respiratory failure with hypoxia ([place])   ? GBS (Guillain Barre syndrome) ([place]) 02/06/2021  ? Chronic anticoagulation 02/06/2021  ? Hyponatremia 02/06/2021  ? Prolonged QT interval 02/06/2021  ? Generalized weakness 02/06/2021  ? Secondary hypercoagulable state ([place]) 11/13/2020  ? Primary osteoarthritis of left knee 03/22/2016  ? Atrial fibrillation with RVR ([place]) 12/01/2013  ? Paroxysmal atrial fibrillation ([place]) 11/30/2013  ? Hypotension 11/30/2013  ? Atrial flutter ([place]) 11/30/2013  ? ? ?REFERRING DIAG: M48.062 (ICD-10-CM) - Spinal stenosis of lumbar region with neurogenic claudication ? ?THERAPY DIAG:  ?Muscle weakness (generalized) ? ?Difficulty in walking, not elsewhere classified ? ?GBS (Guillain-Barre syndrome) ([place]) ? ?PERTINENT HISTORY:  hx of Guillain Barre Syndrome/AIDP with B/L foot drop-  ? also has aflutter- on Eliquis,  spinal stenosis. ? ?PRECAUTIONS: resolving foot drop L>R ? ?SUBJECTIVE: Pt reports walking in park over the week end with husband using rollator for 20 continuous mins before needing a rest. ? ? ?PAIN:  ?Are you having pain? yes ?NPRS scale: 2-3/10 ?Pain location: hands,LB ?Pain orientation: bilat ?PAIN TYPE: aching ?Pain description:  ?Aggravating factors:  ?Relieving factors: rest ?  ?PRECAUTIONS: Other: resolving foot drop L>R ?  ?WEIGHT BEARING RESTRICTIONS No ?  ?FALLS:  ?Has patient fallen in last 6 months? No,  ?  ?LIVING  ENVIRONMENT: ?Lives with: lives with their family ?Lives in: House/apartment ?Stairs: Yes; External: 15 steps; on right going up ?Has following equipment at home: Single point cane, Walker - 2 wheeled, Wheelchair (manual), and Electronics engineer ?  ?OCCUPATION: retired ?  ?PLOF: Independent prior to GBS Aug 2022 ?  ?PATIENT GOALS walk indep, car for myself, return to PLOF ?  ?  ?OBJECTIVE:  ? * all objective findings taken at eval unless otherwise noted.  ?  ?  ?SCREENING FOR RED FLAGS: ?Bowel or bladder incontinence: No ?Spinal tumors: No ?Cauda equina syndrome: No ?Compression fracture: No ?Abdominal aneurysm: No ?  ?COGNITION: ?         Overall cognitive status: Within functional limits for tasks assessed              ?          ?SENSATION: ?         Light touch: Deficits Left distal foot decreased sensation.  Wears Left ankle inversion brace  ?          ?  ?MUSCLE LENGTH: ?Hamstrings: WFL ?  ?  ?POSTURE:  ?Forward hip flex, asymmetry: left hip elevation, right shoulder elevation ?  ?  ?  ?LE AROM/PROM: ?  ?A/PROM Right ?08/25/2021 Left ?08/25/2021  ?Hip flexion      ?Hip extension      ?Hip abduction      ?Hip adduction      ?Hip internal rotation      ?Hip external rotation      ?Knee flexion      ?Knee extension      ?Ankle dorsiflexion 5 -10  ?Ankle plantarflexion full full  ?Ankle inversion      ?Ankle eversion      ? (Blank rows = not tested) ?  ?LE MMT: ?  ?MMT Right ?08/25/2021 Left ?08/25/2021  ?Hip flexion 3+ 3-  ?Hip extension      ?Hip abduction 4 4  ?Hip adduction 4 4  ?Hip internal rotation      ?Hip external rotation      ?Knee flexion 4 4  ?Knee extension 4 4  ?Ankle dorsiflexion 4 3  ?Ankle plantarflexion 4+ 3  ?Ankle inversion      ?Ankle eversion      ? (Blank rows = not tested) ?  ?Straight leg raise test: Negative and Slump test: Negative ?  ?FUNCTIONAL TESTS:  ?5 times sit to stand: 21 using ue from transport chair ?Timed up and go (TUG): 20s ?                    6 minute walk test   540f ?GAIT: ?Distance walked: 550 ft ?Assistive device utilized: WEnvironmental consultant- 2 wheeled ?Level of assistance: CGA ?Comments: Forward hip flex, limited heel strike bilaterally R<L, decreased step length and cadence. ?  ?  ?  ?TODAY'S TREATMENT  ?09/29/21 ?Pt seen for aquatic therapy today.  Treatment  took place in water 3.25-4.38f [facility] Temp of [facility] was 92?.  Pt entered/exited the pool via stairs step to pattern independently with bilat rail. ? ?Warm up: Walking forward, back and sidestepping in 487fx 4 widths each holding noodle/yellow dumbbells ?Standing  ? ? ?Seated on bench with step under feet: ?-flutter kicking (SLR) 2 x 20 ?-add/abd  3x10 ? ?Standing holding to wall ?Hip add/abd, hip extension 2x10 ?Side lunges 2 widths with aquajogger mittens. ?-Hip circles 10 reps CW/CCW ue supported on wall ?-gastroc stretches R/L 5 x20s hold ?-Heel raises/toe raises x 15 ?-Forward step ups x 10 reps each leg; SL step up (TKE) x10 R/L on bottom step ? ?Backward amb 2 widths between exercises for stretch and recovery ? ?Pt requires buoyancy for support and to offload joints with strengthening exercises. Viscosity of the water is needed for resistance of strengthening; water current perturbations provides challenge to standing balance unsupported, requiring increased core activation. ? ?  ?  ?PATIENT EDUCATION:  ?Education details: Properties of water, benefits of aquatic therapy ?Person educated: Patient and Caregiver ?Education method: Explanation ?Education comprehension: verbalized understanding ?  ?  ?HOME EXERCISE PROGRAM: ? stair climbing 2-3 x week with assistance ?Walk distance to pool with each visit. ?HEP: gastroc and hamstring stretching; flutter and add/abd in seated 3x20, STS from 3rd step x5. ?Pt to sleep upstairs 1 x weekly using smaller bathroom, increase stair climbing to 4xweekly ? ?  ?ASSESSMENT: ?  ?CLINICAL IMPRESSION: ?Pt with decreased use of left ankle brace, reports  she does not feel foot inverting anymore. She is instructed on adding stair climbing to 4xweekly and trying to spend 1 night a week I bed on 2nd floor.  She and paid cg will practice getting in and out of  (higher) bed using step stool

## 2021-10-01 ENCOUNTER — Ambulatory Visit (HOSPITAL_BASED_OUTPATIENT_CLINIC_OR_DEPARTMENT_OTHER): Payer: Medicare Other | Admitting: Physical Therapy

## 2021-10-01 ENCOUNTER — Encounter (HOSPITAL_BASED_OUTPATIENT_CLINIC_OR_DEPARTMENT_OTHER): Payer: Self-pay | Admitting: Physical Therapy

## 2021-10-01 DIAGNOSIS — G610 Guillain-Barre syndrome: Secondary | ICD-10-CM

## 2021-10-01 DIAGNOSIS — M6281 Muscle weakness (generalized): Secondary | ICD-10-CM | POA: Diagnosis not present

## 2021-10-01 DIAGNOSIS — R262 Difficulty in walking, not elsewhere classified: Secondary | ICD-10-CM

## 2021-10-01 NOTE — Therapy (Signed)
?OUTPATIENT PHYSICAL THERAPY TREATMENT NOTE ? ? ?Patient Name: Angela Morales ?MRN: [id number] ?DOB:1945/10/23, 76 y.o., female ?Today's Date: 10/01/2021 ? ?PCP: Deland Pretty, MD ?REFERRING PROVIDER: Deland Pretty, MD ? ? PT End of Session - 10/01/21 0929   ? ? Visit Number 7   ? Number of Visits 16   ? Date for PT Re-Evaluation 10/20/21   ? Authorization Time Period 08/25/21 - 10/20/21   ? PT Start Time [phone]   ? PT Stop Time 1014   ? PT Time Calculation (min) 45 min   ? Activity Tolerance Patient tolerated treatment well   ? Behavior During Therapy [facility] for tasks assessed/performed   ? ?  ?  ? ?  ? ? ?Past Medical History:  ?Diagnosis Date  ? Abnormal uterine bleeding   ? on HRT  ? Allergy   ? seasonal  ? Arthritis   ? Atrial fibrillation ([place])   ? Celiac disease   ? DDD (degenerative disc disease)   ? Dysrhythmia   ? GBS (Guillain-Barre syndrome) ([place])   ? Glaucoma   ? Microscopic colitis   ? Osteoarthritis of both knees   ? PONV (postoperative nausea and vomiting)   ? SVT (supraventricular tachycardia) ([place])   ? ?Past Surgical History:  ?Procedure Laterality Date  ? APPENDECTOMY    ? ATRIAL FIBRILLATION ABLATION N/A 01/28/2021  ? Procedure: ATRIAL FIBRILLATION ABLATION;  Surgeon: Constance Haw, MD;  Location: [place] [facility];  Service: Cardiovascular;  Laterality: N/A;  ? CARDIOVERSION N/A 10/30/2020  ? Procedure: CARDIOVERSION;  Surgeon: Jerline Pain, MD;  Location: [facility] ENDOSCOPY;  Service: Cardiovascular;  Laterality: N/A;  ? CARDIOVERSION N/A 03/16/2021  ? Procedure: CARDIOVERSION;  Surgeon: Thayer Headings, MD;  Location: [facility] ENDOSCOPY;  Service: Cardiovascular;  Laterality: N/A;  ? COLONOSCOPY    ? FOOT FRACTURE SURGERY  10/2013  ? TONSILLECTOMY AND ADENOIDECTOMY    ? TOTAL KNEE ARTHROPLASTY Left 03/22/2016  ? Procedure: TOTAL KNEE ARTHROPLASTY;  Surgeon: Dorna Leitz, MD;  Location: [place];  Service: Orthopedics;  Laterality: Left;  ? TUBAL LIGATION  1981  ? VAGINAL HYSTERECTOMY  04/20/05  ? prolapse,  adenomyosis  ? ?Patient Active Problem List  ? Diagnosis Date Noted  ? Abnormality of gait 08/14/2021  ? Left foot drop 08/14/2021  ? Wheelchair dependence 05/15/2021  ? Neurogenic bladder 04/09/2021  ? Atrial flutter with rapid ventricular response ([place]) 03/13/2021  ? Urinary retention   ? Chronic pain syndrome   ? GBS (Guillain-Barre syndrome) ([place]) 03/04/2021  ? Sleep disturbance   ? Atelectasis   ? Malnutrition of moderate degree 02/16/2021  ? Elevated white blood cell count   ? Status post tracheostomy ([place])   ? Oropharyngeal dysphagia   ? Acute respiratory failure with hypoxia ([place])   ? GBS (Guillain Barre syndrome) ([place]) 02/06/2021  ? Chronic anticoagulation 02/06/2021  ? Hyponatremia 02/06/2021  ? Prolonged QT interval 02/06/2021  ? Generalized weakness 02/06/2021  ? Secondary hypercoagulable state ([place]) 11/13/2020  ? Primary osteoarthritis of left knee 03/22/2016  ? Atrial fibrillation with RVR ([place]) 12/01/2013  ? Paroxysmal atrial fibrillation ([place]) 11/30/2013  ? Hypotension 11/30/2013  ? Atrial flutter ([place]) 11/30/2013  ? ? ?REFERRING DIAG: M48.062 (ICD-10-CM) - Spinal stenosis of lumbar region with neurogenic claudication ? ?THERAPY DIAG:  ?Muscle weakness (generalized) ? ?Difficulty in walking, not elsewhere classified ? ?GBS (Guillain-Barre syndrome) ([place]) ? ?PERTINENT HISTORY:  hx of Guillain Barre Syndrome/AIDP with B/L foot drop-  ? also has aflutter- on Eliquis,  spinal stenosis. ? ?PRECAUTIONS: resolving foot drop L>R ? ?SUBJECTIVE: "hands have been hurting due to OA". ? ? ?PAIN:  ?Are you having pain? yes ?NPRS scale: 2-3/10 ?Pain location: hands,LB ?Pain orientation: bilat ?PAIN TYPE: aching ?Pain description:  ?Aggravating factors:  ?Relieving factors: rest ?  ?PRECAUTIONS: Other: resolving foot drop L>R ?  ?WEIGHT BEARING RESTRICTIONS No ?  ?FALLS:  ?Has patient fallen in last 6 months? No,  ?  ?LIVING ENVIRONMENT: ?Lives with: lives with their family ?Lives in: House/apartment ?Stairs: Yes;  External: 15 steps; on right going up ?Has following equipment at home: Single point cane, Walker - 2 wheeled, Wheelchair (manual), and Electronics engineer ?  ?OCCUPATION: retired ?  ?PLOF: Independent prior to GBS Aug 2022 ?  ?PATIENT GOALS walk indep, car for myself, return to PLOF ?  ?  ?OBJECTIVE:  ? * all objective findings taken at eval unless otherwise noted.  ?  ?  ?SCREENING FOR RED FLAGS: ?Bowel or bladder incontinence: No ?Spinal tumors: No ?Cauda equina syndrome: No ?Compression fracture: No ?Abdominal aneurysm: No ?  ?COGNITION: ?         Overall cognitive status: Within functional limits for tasks assessed              ?          ?SENSATION: ?         Light touch: Deficits Left distal foot decreased sensation.  Wears Left ankle inversion brace  ?          ?  ?MUSCLE LENGTH: ?Hamstrings: WFL ?  ?  ?POSTURE:  ?Forward hip flex, asymmetry: left hip elevation, right shoulder elevation ?  ?  ?  ?LE AROM/PROM: ?  ?A/PROM Right ?08/25/2021 Left ?08/25/2021  ?Hip flexion      ?Hip extension      ?Hip abduction      ?Hip adduction      ?Hip internal rotation      ?Hip external rotation      ?Knee flexion      ?Knee extension      ?Ankle dorsiflexion 5 -10  ?Ankle plantarflexion full full  ?Ankle inversion      ?Ankle eversion      ? (Blank rows = not tested) ?  ?LE MMT: ?  ?MMT Right ?08/25/2021 Left ?08/25/2021  ?Hip flexion 3+ 3-  ?Hip extension      ?Hip abduction 4 4  ?Hip adduction 4 4  ?Hip internal rotation      ?Hip external rotation      ?Knee flexion 4 4  ?Knee extension 4 4  ?Ankle dorsiflexion 4 3  ?Ankle plantarflexion 4+ 3  ?Ankle inversion      ?Ankle eversion      ? (Blank rows = not tested) ?  ?Straight leg raise test: Negative and Slump test: Negative ?  ?FUNCTIONAL TESTS:  ?5 times sit to stand: 21 using ue from transport chair ?Timed up and go (TUG): 20s ?                    6 minute walk test  550f ?GAIT: ?Distance walked: 550 ft ?Assistive device utilized: WEnvironmental consultant- 2 wheeled ?Level of assistance:  CGA ?Comments: Forward hip flex, limited heel strike bilaterally R<L, decreased step length and cadence. ?  ?  ?  ?TODAY'S TREATMENT  ?09/29/21 ?Pt seen for aquatic therapy today.  Treatment took place in water 3.25-4.226fin [facility] Temp of  water was 92?.  Pt entered/exited the pool via stairs step to pattern independently with bilat rail. ? ?Walking in all direction x 10 continuous minutes with UE support. ? ? ?Seated on bench with step under feet: ?-flutter kicking (SLR) 4 x 20 ?-add/abd  4x20 ? ?Standing  ?-gastroc stretches R/L 5 x20s hold on bottom step ?-Heel raises/toe raises x 10 bottom step ?Hip add/abd, hip extension 2x10 ?Side lunges 2 widths with aquajogger mittens. ?-Hip circles 10 reps CW/CCW ue supported on wall ? ?   Suspended supine and sidelying: cycling, scissor and add/abd x 10 minutes ? ?   Amb 2 widths between exercises for stretch and recovery when needed ? ?Pt requires buoyancy for support and to offload joints with strengthening exercises. Viscosity of the water is needed for resistance of strengthening; water current perturbations provides challenge to standing balance unsupported, requiring increased core activation. ? ?  ?  ?PATIENT EDUCATION:  ?Education details: Properties of water, benefits of aquatic therapy ?Person educated: Patient and Caregiver ?Education method: Explanation ?Education comprehension: verbalized understanding ?  ?  ?HOME EXERCISE PROGRAM: ? stair climbing 2-3 x week with assistance ?Walk distance to pool with each visit. ?HEP: gastroc and hamstring stretching; flutter and add/abd in seated 3x20, STS from 3rd step x5. ?Pt to sleep upstairs 1 x weekly using smaller bathroom, increase stair climbing to 4xweekly ? ?  ?ASSESSMENT: ?  ?CLINICAL IMPRESSION: ?Pt completed 10 minutes of continuous amb in water to meet LTG #1.  Complains only of some fatigue in back upon completion. Also marked LTG #1 met as she is consistently climbing stairs at  home 3-4 times weekly with supervision and use of handrail. She reports she has been standing and cooking at night with similar issue although demonstrates forward progression towards PLOF. Pt tolerates sessi

## 2021-10-05 ENCOUNTER — Encounter (HOSPITAL_BASED_OUTPATIENT_CLINIC_OR_DEPARTMENT_OTHER): Payer: Self-pay | Admitting: Physical Therapy

## 2021-10-05 ENCOUNTER — Ambulatory Visit (HOSPITAL_BASED_OUTPATIENT_CLINIC_OR_DEPARTMENT_OTHER): Payer: Medicare Other | Admitting: Physical Therapy

## 2021-10-05 DIAGNOSIS — M6281 Muscle weakness (generalized): Secondary | ICD-10-CM | POA: Diagnosis not present

## 2021-10-05 DIAGNOSIS — R262 Difficulty in walking, not elsewhere classified: Secondary | ICD-10-CM

## 2021-10-05 DIAGNOSIS — G610 Guillain-Barre syndrome: Secondary | ICD-10-CM

## 2021-10-05 NOTE — Therapy (Signed)
?OUTPATIENT PHYSICAL THERAPY TREATMENT NOTE ? ? ?Patient Name: Angela Morales ?MRN: [id number] ?DOB:11-28-1945, 76 y.o., female ?Today's Date: 10/05/2021 ? ?PCP: Deland Pretty, MD ?REFERRING PROVIDER: Deland Pretty, MD ? ? PT End of Session - 10/05/21 1118   ? ? Visit Number 8   ? Number of Visits 16   ? Date for PT Re-Evaluation 10/20/21   ? Authorization Time Period 08/25/21 - 10/20/21   ? PT Start Time 1115   ? PT Stop Time 1200   ? PT Time Calculation (min) 45 min   ? Activity Tolerance Patient tolerated treatment well   ? Behavior During Therapy [facility] for tasks assessed/performed   ? ?  ?  ? ?  ? ? ?Past Medical History:  ?Diagnosis Date  ? Abnormal uterine bleeding   ? on HRT  ? Allergy   ? seasonal  ? Arthritis   ? Atrial fibrillation ([place])   ? Celiac disease   ? DDD (degenerative disc disease)   ? Dysrhythmia   ? GBS (Guillain-Barre syndrome) ([place])   ? Glaucoma   ? Microscopic colitis   ? Osteoarthritis of both knees   ? PONV (postoperative nausea and vomiting)   ? SVT (supraventricular tachycardia) ([place])   ? ?Past Surgical History:  ?Procedure Laterality Date  ? APPENDECTOMY    ? ATRIAL FIBRILLATION ABLATION N/A 01/28/2021  ? Procedure: ATRIAL FIBRILLATION ABLATION;  Surgeon: Constance Haw, MD;  Location: [place] [facility];  Service: Cardiovascular;  Laterality: N/A;  ? CARDIOVERSION N/A 10/30/2020  ? Procedure: CARDIOVERSION;  Surgeon: Jerline Pain, MD;  Location: [facility] ENDOSCOPY;  Service: Cardiovascular;  Laterality: N/A;  ? CARDIOVERSION N/A 03/16/2021  ? Procedure: CARDIOVERSION;  Surgeon: Thayer Headings, MD;  Location: [facility] ENDOSCOPY;  Service: Cardiovascular;  Laterality: N/A;  ? COLONOSCOPY    ? FOOT FRACTURE SURGERY  10/2013  ? TONSILLECTOMY AND ADENOIDECTOMY    ? TOTAL KNEE ARTHROPLASTY Left 03/22/2016  ? Procedure: TOTAL KNEE ARTHROPLASTY;  Surgeon: Dorna Leitz, MD;  Location: [place];  Service: Orthopedics;  Laterality: Left;  ? TUBAL LIGATION  1981  ? VAGINAL HYSTERECTOMY  04/20/05  ? prolapse,  adenomyosis  ? ?Patient Active Problem List  ? Diagnosis Date Noted  ? Abnormality of gait 08/14/2021  ? Left foot drop 08/14/2021  ? Wheelchair dependence 05/15/2021  ? Neurogenic bladder 04/09/2021  ? Atrial flutter with rapid ventricular response ([place]) 03/13/2021  ? Urinary retention   ? Chronic pain syndrome   ? GBS (Guillain-Barre syndrome) ([place]) 03/04/2021  ? Sleep disturbance   ? Atelectasis   ? Malnutrition of moderate degree 02/16/2021  ? Elevated white blood cell count   ? Status post tracheostomy ([place])   ? Oropharyngeal dysphagia   ? Acute respiratory failure with hypoxia ([place])   ? GBS (Guillain Barre syndrome) ([place]) 02/06/2021  ? Chronic anticoagulation 02/06/2021  ? Hyponatremia 02/06/2021  ? Prolonged QT interval 02/06/2021  ? Generalized weakness 02/06/2021  ? Secondary hypercoagulable state ([place]) 11/13/2020  ? Primary osteoarthritis of left knee 03/22/2016  ? Atrial fibrillation with RVR ([place]) 12/01/2013  ? Paroxysmal atrial fibrillation ([place]) 11/30/2013  ? Hypotension 11/30/2013  ? Atrial flutter ([place]) 11/30/2013  ? ? ?REFERRING DIAG: M48.062 (ICD-10-CM) - Spinal stenosis of lumbar region with neurogenic claudication ? ?THERAPY DIAG:  ?Muscle weakness (generalized) ? ?Difficulty in walking, not elsewhere classified ? ?GBS (Guillain-Barre syndrome) ([place]) ? ?PERTINENT HISTORY:  hx of Guillain Barre Syndrome/AIDP with B/L foot drop-  ? also has aflutter- on Eliquis,  spinal stenosis. ? ?PRECAUTIONS: resolving foot drop L>R ? ?SUBJECTIVE: "doing well". ? ? ?PAIN:  ?Are you having pain? yes ?NPRS scale: 2/10 ?Pain location: hands,LB ?Pain orientation: bilat ?PAIN TYPE: aching ?Pain description:  ?Aggravating factors:  ?Relieving factors: rest ?  ?PRECAUTIONS: Other: resolving foot drop L>R ?  ?WEIGHT BEARING RESTRICTIONS No ?  ?FALLS:  ?Has patient fallen in last 6 months? No,  ?  ?LIVING ENVIRONMENT: ?Lives with: lives with their family ?Lives in: House/apartment ?Stairs: Yes; External: 15 steps; on  right going up ?Has following equipment at home: Single point cane, Walker - 2 wheeled, Wheelchair (manual), and Electronics engineer ?  ?OCCUPATION: retired ?  ?PLOF: Independent prior to GBS Aug 2022 ?  ?PATIENT GOALS walk indep, car for myself, return to PLOF ?  ?  ?OBJECTIVE:  ? * all objective findings taken at eval unless otherwise noted.  ?  ?  ?SCREENING FOR RED FLAGS: ?Bowel or bladder incontinence: No ?Spinal tumors: No ?Cauda equina syndrome: No ?Compression fracture: No ?Abdominal aneurysm: No ?  ?COGNITION: ?         Overall cognitive status: Within functional limits for tasks assessed              ?          ?SENSATION: ?         Light touch: Deficits Left distal foot decreased sensation.  Wears Left ankle inversion brace  ?          ?  ?MUSCLE LENGTH: ?Hamstrings: WFL ?  ?  ?POSTURE:  ?Forward hip flex, asymmetry: left hip elevation, right shoulder elevation ?  ?  ?  ?LE AROM/PROM: ?  ?A/PROM Right ?08/25/2021 Left ?08/25/2021  ?Hip flexion      ?Hip extension      ?Hip abduction      ?Hip adduction      ?Hip internal rotation      ?Hip external rotation      ?Knee flexion      ?Knee extension      ?Ankle dorsiflexion 5 -10  ?Ankle plantarflexion full full  ?Ankle inversion      ?Ankle eversion      ? (Blank rows = not tested) ?  ?LE MMT: ?  ?MMT Right ?08/25/2021 Left ?08/25/2021  ?Hip flexion 3+ 3-  ?Hip extension      ?Hip abduction 4 4  ?Hip adduction 4 4  ?Hip internal rotation      ?Hip external rotation      ?Knee flexion 4 4  ?Knee extension 4 4  ?Ankle dorsiflexion 4 3  ?Ankle plantarflexion 4+ 3  ?Ankle inversion      ?Ankle eversion      ? (Blank rows = not tested) ?  ?Straight leg raise test: Negative and Slump test: Negative ?  ?FUNCTIONAL TESTS:  ?5 times sit to stand: 21 using ue from transport chair ?Timed up and go (TUG): 20s ?                    6 minute walk test  547f ?GAIT: ?Distance walked: 550 ft ?Assistive device utilized: WEnvironmental consultant- 2 wheeled ?Level of assistance: CGA ?Comments: Forward  hip flex, limited heel strike bilaterally R<L, decreased step length and cadence. ?  ?  ?  ?TODAY'S TREATMENT  ?10/05/21 ?Pt seen for aquatic therapy today.  Treatment took place in water 3.25-4.232fin [facility] Temp of water was 92?. Marland KitchenPt  entered/exited the pool via stairs step to pattern independently with bilat rail. ? ?Walking in all direction x 10 continuous minutes with UE support. ? ?   Stair climbing alternating pattern up and down 7 steps 5-6 reps ?Seated on bench with step under feet: ?-flutter kicking (SLR) 3 x 20 ?-add/abd 3 x20 ?-adductor sets squeezing buoyancy ball 2 x10 5s hold each ?-gastroc stretches R/L 5 x20s hold on bottom step ?-STS 3rd (bottom) step 2 x5 Cuing for immediate standing balance ? ? ?Standing  ?-Heel raises/toe raises x 10 bottom step ?Hip add/abd, hip extension 2x10 ?-Hip circles 10 reps CW/CCW ue supported on wall ? ?   Suspended supine and sidelying: cycling, scissor and add/abd x 10 minutes ? ?   Amb 2 widths between exercises for stretch and recovery when needed ? ?Pt requires buoyancy for support and to offload joints with strengthening exercises. Viscosity of the water is needed for resistance of strengthening; water current perturbations provides challenge to standing balance unsupported, requiring increased core activation. ? ?  ?  ?PATIENT EDUCATION:  ?Education details: Properties of water, benefits of aquatic therapy ?Person educated: Patient and Caregiver ?Education method: Explanation ?Education comprehension: verbalized understanding ?  ?  ?HOME EXERCISE PROGRAM: ? stair climbing 2-3 x week with assistance ?Walk distance to pool with each visit. ?HEP: gastroc and hamstring stretching; flutter and add/abd in seated 3x20, STS from 3rd step x5. ?Pt to sleep upstairs 1 x weekly using smaller bathroom, increase stair climbing to 4xweekly ? ?  ?ASSESSMENT: ?  ?CLINICAL IMPRESSION: ?Cuing needed with  stair climbing to ascend with knee extension  completely rather than pulling up on hand rail, and eccentric control with descending. She is unable to control descent until submerged mid thigh due to weakness. She has not yet had the opportunity to sleep up

## 2021-10-09 ENCOUNTER — Ambulatory Visit (HOSPITAL_BASED_OUTPATIENT_CLINIC_OR_DEPARTMENT_OTHER): Payer: Medicare Other | Admitting: Physical Therapy

## 2021-10-09 DIAGNOSIS — M6281 Muscle weakness (generalized): Secondary | ICD-10-CM

## 2021-10-09 DIAGNOSIS — R262 Difficulty in walking, not elsewhere classified: Secondary | ICD-10-CM

## 2021-10-09 DIAGNOSIS — G610 Guillain-Barre syndrome: Secondary | ICD-10-CM

## 2021-10-09 NOTE — Therapy (Signed)
?OUTPATIENT PHYSICAL THERAPY TREATMENT NOTE ? ? ?Patient Name: Angela Morales ?MRN: [id number] ?DOB:1946-01-01, 76 y.o., female ?Today's Date: 10/09/2021 ? ?PCP: Deland Pretty, MD ?REFERRING PROVIDER: Deland Pretty, MD ? ? PT End of Session - 10/09/21 1249   ? ? Visit Number 9   ? Number of Visits 16   ? Date for PT Re-Evaluation 10/20/21   ? Authorization Time Period 08/25/21 - 10/20/21   ? Progress Note Due on Visit 10   ? PT Start Time 1100   ? PT Stop Time 1140   ? PT Time Calculation (min) 40 min   ? Activity Tolerance Patient tolerated treatment well   ? Behavior During Therapy [facility] for tasks assessed/performed   ? ?  ?  ? ?  ? ? ? ?Past Medical History:  ?Diagnosis Date  ? Abnormal uterine bleeding   ? on HRT  ? Allergy   ? seasonal  ? Arthritis   ? Atrial fibrillation ([place])   ? Celiac disease   ? DDD (degenerative disc disease)   ? Dysrhythmia   ? GBS (Guillain-Barre syndrome) ([place])   ? Glaucoma   ? Microscopic colitis   ? Osteoarthritis of both knees   ? PONV (postoperative nausea and vomiting)   ? SVT (supraventricular tachycardia) ([place])   ? ?Past Surgical History:  ?Procedure Laterality Date  ? APPENDECTOMY    ? ATRIAL FIBRILLATION ABLATION N/A 01/28/2021  ? Procedure: ATRIAL FIBRILLATION ABLATION;  Surgeon: Constance Haw, MD;  Location: [place] [facility];  Service: Cardiovascular;  Laterality: N/A;  ? CARDIOVERSION N/A 10/30/2020  ? Procedure: CARDIOVERSION;  Surgeon: Jerline Pain, MD;  Location[facility] ENDOSCOPY;  Service: Cardiovascular;  Laterality: N/A;  ? CARDIOVERSION N/A 03/16/2021  ? Procedure: CARDIOVERSION;  Surgeon: Thayer Headings, MD;  Location: [facility] ENDOSCOPY;  Service: Cardiovascular;  Laterality: N/A;  ? COLONOSCOPY    ? FOOT FRACTURE SURGERY  10/2013  ? TONSILLECTOMY AND ADENOIDECTOMY    ? TOTAL KNEE ARTHROPLASTY Left 03/22/2016  ? Procedure: TOTAL KNEE ARTHROPLASTY;  Surgeon: Dorna Leitz, MD;  Location: [place];  Service: Orthopedics;  Laterality: Left;  ? TUBAL LIGATION  1981  ? VAGINAL  HYSTERECTOMY  04/20/05  ? prolapse, adenomyosis  ? ?Patient Active Problem List  ? Diagnosis Date Noted  ? Abnormality of gait 08/14/2021  ? Left foot drop 08/14/2021  ? Wheelchair dependence 05/15/2021  ? Neurogenic bladder 04/09/2021  ? Atrial flutter with rapid ventricular response ([place]) 03/13/2021  ? Urinary retention   ? Chronic pain syndrome   ? GBS (Guillain-Barre syndrome) ([place]) 03/04/2021  ? Sleep disturbance   ? Atelectasis   ? Malnutrition of moderate degree 02/16/2021  ? Elevated white blood cell count   ? Status post tracheostomy ([place])   ? Oropharyngeal dysphagia   ? Acute respiratory failure with hypoxia ([place])   ? GBS (Guillain Barre syndrome) ([place]) 02/06/2021  ? Chronic anticoagulation 02/06/2021  ? Hyponatremia 02/06/2021  ? Prolonged QT interval 02/06/2021  ? Generalized weakness 02/06/2021  ? Secondary hypercoagulable state ([place]) 11/13/2020  ? Primary osteoarthritis of left knee 03/22/2016  ? Atrial fibrillation with RVR ([place]) 12/01/2013  ? Paroxysmal atrial fibrillation ([place]) 11/30/2013  ? Hypotension 11/30/2013  ? Atrial flutter ([place]) 11/30/2013  ? ? ?REFERRING DIAG: M48.062 (ICD-10-CM) - Spinal stenosis of lumbar region with neurogenic claudication ? ?THERAPY DIAG:  ?No diagnosis found. ? ?PERTINENT HISTORY:  hx of Guillain Barre Syndrome/AIDP with B/L foot drop-  ? also has aflutter- on Eliquis, spinal stenosis. ? ?  PRECAUTIONS: resolving foot drop L>R ? ?SUBJECTIVE:  She ironed for first time in a long time and cooked most of dinner. This morning she complains of LE soreness.  Per caregiver she went up/down ramp with walker 2x yesterday. She has a goal of sleeping in bed upstairs this weekend.  ? ?PAIN:  ?Are you having pain? yes ?NPRS scale: 2/10 ?Pain location: LEs ?Pain orientation: bilat ?PAIN TYPE: aching ?Pain description:  ?Aggravating factors:  ?Relieving factors: rest ?  ?PRECAUTIONS: Other: resolving foot drop L>R ?  ?WEIGHT BEARING RESTRICTIONS No ?  ?FALLS:  ?Has patient fallen in  last 6 months? No,  ?  ?LIVING ENVIRONMENT: ?Lives with: lives with their family ?Lives in: House/apartment ?Stairs: Yes; External: 15 steps; on right going up ?Has following equipment at home: Single point cane, Walker - 2 wheeled, Wheelchair (manual), and Electronics engineer ?  ?OCCUPATION: retired ?  ?PLOF: Independent prior to GBS Aug 2022 ?  ?PATIENT GOALS walk indep, car for myself, return to PLOF ?  ?  ?OBJECTIVE:  ? * all objective findings taken at eval unless otherwise noted.  ?  ?  ?SCREENING FOR RED FLAGS: ?Bowel or bladder incontinence: No ?Spinal tumors: No ?Cauda equina syndrome: No ?Compression fracture: No ?Abdominal aneurysm: No ?  ?COGNITION: ?         Overall cognitive status: Within functional limits for tasks assessed              ?          ?SENSATION: ?         Light touch: Deficits Left distal foot decreased sensation.  Wears Left ankle inversion brace  ?          ?  ?MUSCLE LENGTH: ?Hamstrings: WFL ?  ?  ?POSTURE:  ?Forward hip flex, asymmetry: left hip elevation, right shoulder elevation ?  ?  ?  ?LE AROM/PROM: ?  ?A/PROM Right ?08/25/2021 Left ?08/25/2021 Left ?10/09/21  ?Hip flexion       ?Hip extension       ?Hip abduction       ?Hip adduction       ?Hip internal rotation       ?Hip external rotation       ?Knee flexion       ?Knee extension       ?Ankle dorsiflexion 5 -10 AROM: -8? in long sitting ?PROM: 1? in long sitting    ?Ankle plantarflexion full full   ?Ankle inversion       ?Ankle eversion       ? (Blank rows = not tested) ?  ?LE MMT: ?  ?MMT Right ?08/25/2021 Left ?08/25/2021  ?Hip flexion 3+ 3-  ?Hip extension      ?Hip abduction 4 4  ?Hip adduction 4 4  ?Hip internal rotation      ?Hip external rotation      ?Knee flexion 4 4  ?Knee extension 4 4  ?Ankle dorsiflexion 4 3  ?Ankle plantarflexion 4+ 3  ?Ankle inversion      ?Ankle eversion      ? (Blank rows = not tested) ?  ?Straight leg raise test: Negative and Slump test: Negative ?  ?FUNCTIONAL TESTS:  ?5 times sit to stand: 21 using  ue from transport chair ?10/09/21: 5x STS: 16.33 sec using UE, on hot tub surround ?Timed up and go (TUG): 20s ?  6 minute walk test  545f ?GAIT: ?Distance walked: 550 ft ?Assistive device utilized: WEnvironmental consultant- 2 wheeled ?Level of assistance: CGA ?Comments: Forward hip flex, limited heel strike bilaterally R<L, decreased step length and cadence. ?  ?  ?  ?TODAY'S TREATMENT  ?10/08/21 ?Pt seen for aquatic therapy today.  Treatment took place in water 3.25-4.260fin [facility] Temp of water was 93?. Marland KitchenPt entered/exited the pool via stairs step to pattern independently with bilat rail. ? ?Walking forward/ backward/ side stepping x 4 laps of each for warm up ?Standing holding onto wall: ?Heel raises/toe raises x 20 ?Hip ext x 10 x 2 ?Hip circles 10 reps CW/CCW each, UE supported on wall ?Holding yellow dumbbells ?Hip add/abd x10 x2 ?Suspended supine (yellow noodle behind back, supporting arms):  cycling, frog kick, hip abdct/add ?Forward step ups (and retro step down) x 10 each leg;  Lateral step ups with single arm on railx 10 each leg ?Seated on 4th step from bottom:  ?adductor sets squeezing buoyancy ball  5 sec x 10 hold each ?   flutter kicking (SLR) 3 x 20 ?   STS without UE support x 5  (on 3rd step); very fatigued by 4th rep ?   gastroc stretches R/L 5 x20s hold on bottom step ?    ?   Amb 2 widths between exercises for stretch and recovery when needed ? ?Pt requires buoyancy for support and to offload joints with strengthening exercises. Viscosity of the water is needed for resistance of strengthening; water current perturbations provides challenge to standing balance unsupported, requiring increased core activation. ? ?  ?PATIENT EDUCATION:  ?Education details: Properties of water, benefits of aquatic therapy ?Person educated: Patient and Caregiver ?Education method: Explanation ?Education comprehension: verbalized understanding ?  ?  ?HOME EXERCISE PROGRAM: ? stair  climbing 2-3 x week with assistance ?Walk distance to pool with each visit. ?HEP: gastroc and hamstring stretching; flutter and add/abd in seated 3x20, STS from 3rd step x5. ?Pt to sleep upstairs 1 x weekly [place]

## 2021-10-13 ENCOUNTER — Ambulatory Visit (HOSPITAL_BASED_OUTPATIENT_CLINIC_OR_DEPARTMENT_OTHER): Payer: Medicare Other | Admitting: Physical Therapy

## 2021-10-13 DIAGNOSIS — M6281 Muscle weakness (generalized): Secondary | ICD-10-CM | POA: Diagnosis not present

## 2021-10-13 DIAGNOSIS — R262 Difficulty in walking, not elsewhere classified: Secondary | ICD-10-CM

## 2021-10-13 DIAGNOSIS — G610 Guillain-Barre syndrome: Secondary | ICD-10-CM

## 2021-10-13 NOTE — Therapy (Signed)
?OUTPATIENT PHYSICAL THERAPY TREATMENT NOTE ?Progress Note ?Reporting Period 08/25/21 to 10/13/21 ? ?See note below for Objective Data and Assessment of Progress/Goals.  ? ?  ? ?Patient Name: Angela Morales ?MRN: [id number] ?DOB:Mar 10, 1946, 76 y.o., female ?Today's Date: 10/13/2021 ? ?PCP: Deland Pretty, MD ?REFERRING PROVIDER: Deland Pretty, MD ? ? PT End of Session - 10/13/21 1123   ? ? Visit Number 10   ? Number of Visits 16   ? Date for PT Re-Evaluation 10/20/21   ? Authorization Time Period 08/25/21 - 10/20/21   ? Progress Note Due on Visit 10   ? PT Start Time 1118   ? PT Stop Time 1200   ? PT Time Calculation (min) 42 min   ? Activity Tolerance Patient tolerated treatment well   ? Behavior During Therapy [facility] for tasks assessed/performed   ? ?  ?  ? ?  ? ? ? ?Past Medical History:  ?Diagnosis Date  ? Abnormal uterine bleeding   ? on HRT  ? Allergy   ? seasonal  ? Arthritis   ? Atrial fibrillation ([place])   ? Celiac disease   ? DDD (degenerative disc disease)   ? Dysrhythmia   ? GBS (Guillain-Barre syndrome) ([place])   ? Glaucoma   ? Microscopic colitis   ? Osteoarthritis of both knees   ? PONV (postoperative nausea and vomiting)   ? SVT (supraventricular tachycardia) ([place])   ? ?Past Surgical History:  ?Procedure Laterality Date  ? APPENDECTOMY    ? ATRIAL FIBRILLATION ABLATION N/A 01/28/2021  ? Procedure: ATRIAL FIBRILLATION ABLATION;  Surgeon: Constance Haw, MD;  Location: [place] [facility];  Service: Cardiovascular;  Laterality: N/A;  ? CARDIOVERSION N/A 10/30/2020  ? Procedure: CARDIOVERSION;  Surgeon: Jerline Pain, MD;  Location: [facility] ENDOSCOPY;  Service: Cardiovascular;  Laterality: N/A;  ? CARDIOVERSION N/A 03/16/2021  ? Procedure: CARDIOVERSION;  Surgeon: Thayer Headings, MD;  Location: [facility] ENDOSCOPY;  Service: Cardiovascular;  Laterality: N/A;  ? COLONOSCOPY    ? FOOT FRACTURE SURGERY  10/2013  ? TONSILLECTOMY AND ADENOIDECTOMY    ? TOTAL KNEE ARTHROPLASTY Left 03/22/2016  ? Procedure: TOTAL KNEE  ARTHROPLASTY;  Surgeon: Dorna Leitz, MD;  Location: [place];  Service: Orthopedics;  Laterality: Left;  ? TUBAL LIGATION  1981  ? VAGINAL HYSTERECTOMY  04/20/05  ? prolapse, adenomyosis  ? ?Patient Active Problem List  ? Diagnosis Date Noted  ? Abnormality of gait 08/14/2021  ? Left foot drop 08/14/2021  ? Wheelchair dependence 05/15/2021  ? Neurogenic bladder 04/09/2021  ? Atrial flutter with rapid ventricular response ([place]) 03/13/2021  ? Urinary retention   ? Chronic pain syndrome   ? GBS (Guillain-Barre syndrome) ([place]) 03/04/2021  ? Sleep disturbance   ? Atelectasis   ? Malnutrition of moderate degree 02/16/2021  ? Elevated white blood cell count   ? Status post tracheostomy ([place])   ? Oropharyngeal dysphagia   ? Acute respiratory failure with hypoxia ([place])   ? GBS (Guillain Barre syndrome) ([place]) 02/06/2021  ? Chronic anticoagulation 02/06/2021  ? Hyponatremia 02/06/2021  ? Prolonged QT interval 02/06/2021  ? Generalized weakness 02/06/2021  ? Secondary hypercoagulable state ([place]) 11/13/2020  ? Primary osteoarthritis of left knee 03/22/2016  ? Atrial fibrillation with RVR ([place]) 12/01/2013  ? Paroxysmal atrial fibrillation ([place]) 11/30/2013  ? Hypotension 11/30/2013  ? Atrial flutter ([place]) 11/30/2013  ? ? ?REFERRING DIAG: M48.062 (ICD-10-CM) - Spinal stenosis of lumbar region with neurogenic claudication ? ?THERAPY DIAG:  ?Muscle weakness (generalized) ? ?  Difficulty in walking, not elsewhere classified ? ?GBS (Guillain-Barre syndrome) ([place]) ? ?PERTINENT HISTORY:  hx of Guillain Barre Syndrome/AIDP with B/L foot drop-  ? also has aflutter- on Eliquis, spinal stenosis. ? ?PRECAUTIONS: resolving foot drop L>R ? ?SUBJECTIVE:  Pt reports emptying dishwasher and walking throughout kitchen "cruising". ? ?PAIN:  ?Are you having pain? yes ?NPRS scale: 2/10 ?Pain location: LEs ?Pain orientation: bilat ?PAIN TYPE: aching ?Pain description:  ?Aggravating factors:  ?Relieving factors: rest ?  ?PRECAUTIONS: Other: resolving foot  drop L>R ?  ?WEIGHT BEARING RESTRICTIONS No ?  ?FALLS:  ?Has patient fallen in last 6 months? No,  ?  ?LIVING ENVIRONMENT: ?Lives with: lives with their family ?Lives in: House/apartment ?Stairs: Yes; External: 15 steps; on right going up ?Has following equipment at home: Single point cane, Walker - 2 wheeled, Wheelchair (manual), and Electronics engineer ?  ?OCCUPATION: retired ?  ?PLOF: Independent prior to GBS Aug 2022 ?  ?PATIENT GOALS walk indep, car for myself, return to PLOF ?  ?  ?OBJECTIVE:  ? * all objective findings taken at eval unless otherwise noted.  ?  ?  ?SCREENING FOR RED FLAGS: ?Bowel or bladder incontinence: No ?Spinal tumors: No ?Cauda equina syndrome: No ?Compression fracture: No ?Abdominal aneurysm: No ?  ?COGNITION: ?         Overall cognitive status: Within functional limits for tasks assessed              ?          ?SENSATION: ?         Light touch: Deficits Left distal foot decreased sensation.  Wears Left ankle inversion brace  ?          ?  ?MUSCLE LENGTH: ?Hamstrings: WFL ?  ?  ?POSTURE:  ?Forward hip flex, asymmetry: left hip elevation, right shoulder elevation ?  ?  ?  ?LE AROM/PROM: ?  ?A/PROM Right ?08/25/2021 Left ?08/25/2021 Left ?10/09/21  ?Hip flexion       ?Hip extension       ?Hip abduction       ?Hip adduction       ?Hip internal rotation       ?Hip external rotation       ?Knee flexion       ?Knee extension       ?Ankle dorsiflexion 5 -10 AROM: -8? in long sitting ?PROM: 1? in long sitting    ?Ankle plantarflexion full full   ?Ankle inversion       ?Ankle eversion       ? (Blank rows = not tested) ?  ?LE MMT: ?  ?MMT Right ?08/25/2021 Left ?08/25/2021 Right ?10/13/21 Left ?10/13/21  ?Hip flexion 3+ 3- 4 3+  ?Hip extension        ?Hip abduction 4 4 5- 4+  ?Hip adduction 4 4 5- 5-  ?Hip internal rotation        ?Hip external rotation        ?Knee flexion 4 4 5- 5-  ?Knee extension 4 4 5- 5-  ?Ankle dorsiflexion 4 3 5  3+  ?Ankle plantarflexion 4+ 3 5 3+  ?Ankle inversion        ?Ankle  eversion      3-  ? (Blank rows = not tested) ?  ?Straight leg raise test: Negative and Slump test: Negative ?  ?FUNCTIONAL TESTS:  ?5 times sit to stand: 21 using ue from transport chair ?10/09/21: 5x STS: 16.33 sec using UE, on  hot tub surround ?Timed up and go (TUG): 20s ?10/13/21 TUG 16s ?                    6 minute walk test  511f ?         10/13/21 6MWT 853 ft ?GAIT: ?Distance walked: 550 ft ?Assistive device utilized: WEnvironmental consultant- 2 wheeled ?Level of assistance: CGA ?Comments: Forward hip flex, limited heel strike bilaterally R<L, decreased step length and cadence. ?  ?  ?  ?TODAY'S TREATMENT  ?10/08/21 ?Pt seen for aquatic therapy today.  Treatment took place in water 3.25-4.287fin [facility] Temp of water was 93?. Marland KitchenPt entered/exited the pool via stairs step to pattern independently with bilat rail. ? ?Walking forward/ backward/ side stepping x 4 laps of each for warm up ?All exercises completed using 2.5 ankle weights ? ?Standing UE supported by yellow hand buoys ?Heel raises/toe raises x 20 ?Hip add/abd; ext x 10 R/L ea ?Forward step ups (and retro step down) x 10 each leg  ? ?Seated on 4th step from bottom:  ?   flutter kicking (SLR); add/abd 3 x 20 ?    ?   Amb 2 widths between exercises for stretch and recovery when needed ? ?Pt requires buoyancy for support and to offload joints with strengthening exercises. Viscosity of the water is needed for resistance of strengthening; water current perturbations provides challenge to standing balance unsupported, requiring increased core activation. ? ?Testing completed for Progress note. ? ?  ?PATIENT EDUCATION:  ?Education details: Properties of water, benefits of aquatic therapy ?Person educated: Patient and Caregiver ?Education method: Explanation ?Education comprehension: verbalized understanding ?  ?  ?HOME EXERCISE PROGRAM: ? stair climbing 2-3 x week with assistance ?Walk distance to pool with each visit. ?HEP: gastroc and hamstring  stretching; flutter and add/abd in seated 3x20, STS from 3rd step x5. ?Pt to sleep upstairs 1 x weekly using smaller bathroom, increase stair climbing to 4xweekly ? ?  ?ASSESSMENT: ?  ?CLINICAL IMPRESSION: ?Added 2.5lb a

## 2021-10-15 ENCOUNTER — Encounter (HOSPITAL_BASED_OUTPATIENT_CLINIC_OR_DEPARTMENT_OTHER): Payer: Self-pay | Admitting: Physical Therapy

## 2021-10-15 ENCOUNTER — Ambulatory Visit (HOSPITAL_BASED_OUTPATIENT_CLINIC_OR_DEPARTMENT_OTHER): Payer: Medicare Other | Admitting: Physical Therapy

## 2021-10-15 DIAGNOSIS — R262 Difficulty in walking, not elsewhere classified: Secondary | ICD-10-CM

## 2021-10-15 DIAGNOSIS — M6281 Muscle weakness (generalized): Secondary | ICD-10-CM | POA: Diagnosis not present

## 2021-10-15 DIAGNOSIS — G610 Guillain-Barre syndrome: Secondary | ICD-10-CM

## 2021-10-15 NOTE — Therapy (Signed)
?OUTPATIENT PHYSICAL THERAPY TREATMENT NOTE ?Progress Note ?  ? ?Patient Name: Angela Morales ?MRN: [id number] ?DOB:1945-12-03, 76 y.o., female ?Today's Date: 10/15/2021 ? ?PCP: Deland Pretty, MD ?REFERRING PROVIDER: Deland Pretty, MD ? ? PT End of Session - 10/15/21 1204   ? ? Visit Number 11   ? Number of Visits 16   ? Date for PT Re-Evaluation 10/20/21   ? Authorization Time Period 08/25/21 - 10/20/21   ? Progress Note Due on Visit 10   ? PT Start Time 1116   ? PT Stop Time 1156   ? PT Time Calculation (min) 40 min   ? Activity Tolerance Patient tolerated treatment well   ? Behavior During Therapy [facility] for tasks assessed/performed   ? ?  ?  ? ?  ? ? ? ? ?Past Medical History:  ?Diagnosis Date  ? Abnormal uterine bleeding   ? on HRT  ? Allergy   ? seasonal  ? Arthritis   ? Atrial fibrillation ([place])   ? Celiac disease   ? DDD (degenerative disc disease)   ? Dysrhythmia   ? GBS (Guillain-Barre syndrome) ([place])   ? Glaucoma   ? Microscopic colitis   ? Osteoarthritis of both knees   ? PONV (postoperative nausea and vomiting)   ? SVT (supraventricular tachycardia) ([place])   ? ?Past Surgical History:  ?Procedure Laterality Date  ? APPENDECTOMY    ? ATRIAL FIBRILLATION ABLATION N/A 01/28/2021  ? Procedure: ATRIAL FIBRILLATION ABLATION;  Surgeon: Constance Haw, MD;  Location: [place] [facility];  Service: Cardiovascular;  Laterality: N/A;  ? CARDIOVERSION N/A 10/30/2020  ? Procedure: CARDIOVERSION;  Surgeon: Jerline Pain, MD;  Location: [facility] ENDOSCOPY;  Service: Cardiovascular;  Laterality: N/A;  ? CARDIOVERSION N/A 03/16/2021  ? Procedure: CARDIOVERSION;  Surgeon: Thayer Headings, MD;  Location: [facility] ENDOSCOPY;  Service: Cardiovascular;  Laterality: N/A;  ? COLONOSCOPY    ? FOOT FRACTURE SURGERY  10/2013  ? TONSILLECTOMY AND ADENOIDECTOMY    ? TOTAL KNEE ARTHROPLASTY Left 03/22/2016  ? Procedure: TOTAL KNEE ARTHROPLASTY;  Surgeon: Dorna Leitz, MD;  Location: [place];  Service: Orthopedics;  Laterality: Left;  ? TUBAL LIGATION   1981  ? VAGINAL HYSTERECTOMY  04/20/05  ? prolapse, adenomyosis  ? ?Patient Active Problem List  ? Diagnosis Date Noted  ? Abnormality of gait 08/14/2021  ? Left foot drop 08/14/2021  ? Wheelchair dependence 05/15/2021  ? Neurogenic bladder 04/09/2021  ? Atrial flutter with rapid ventricular response ([place]) 03/13/2021  ? Urinary retention   ? Chronic pain syndrome   ? GBS (Guillain-Barre syndrome) ([place]) 03/04/2021  ? Sleep disturbance   ? Atelectasis   ? Malnutrition of moderate degree 02/16/2021  ? Elevated white blood cell count   ? Status post tracheostomy ([place])   ? Oropharyngeal dysphagia   ? Acute respiratory failure with hypoxia ([place])   ? GBS (Guillain Barre syndrome) ([place]) 02/06/2021  ? Chronic anticoagulation 02/06/2021  ? Hyponatremia 02/06/2021  ? Prolonged QT interval 02/06/2021  ? Generalized weakness 02/06/2021  ? Secondary hypercoagulable state ([place]) 11/13/2020  ? Primary osteoarthritis of left knee 03/22/2016  ? Atrial fibrillation with RVR ([place]) 12/01/2013  ? Paroxysmal atrial fibrillation ([place]) 11/30/2013  ? Hypotension 11/30/2013  ? Atrial flutter ([place]) 11/30/2013  ? ? ?REFERRING DIAG: M48.062 (ICD-10-CM) - Spinal stenosis of lumbar region with neurogenic claudication ? ?THERAPY DIAG:  ?Muscle weakness (generalized) ? ?Difficulty in walking, not elsewhere classified ? ?GBS (Guillain-Barre syndrome) ([place]) ? ?PERTINENT HISTORY:  hx of  Guillain Barre Syndrome/AIDP with B/L foot drop-  ? also has aflutter- on Eliquis, spinal stenosis. ? ?PRECAUTIONS: resolving foot drop L>R ? ?SUBJECTIVE:  "Cooked entire meal last night without problems, 1 short rest period. Did have some increase in back pain after last session" ? ?PAIN:  ?Are you having pain? yes ?NPRS scale: 2/10 ?Pain location: LEs ?Pain orientation: bilat ?PAIN TYPE: aching ?Pain description:  ?Aggravating factors:  ?Relieving factors: rest ?  ?PRECAUTIONS: Other: resolving foot drop L>R ?  ?WEIGHT BEARING RESTRICTIONS No ?  ?FALLS:  ?Has patient  fallen in last 6 months? No,  ?  ?LIVING ENVIRONMENT: ?Lives with: lives with their family ?Lives in: House/apartment ?Stairs: Yes; External: 15 steps; on right going up ?Has following equipment at home: Single point cane, Walker - 2 wheeled, Wheelchair (manual), and Electronics engineer ?  ?OCCUPATION: retired ?  ?PLOF: Independent prior to GBS Aug 2022 ?  ?PATIENT GOALS walk indep, car for myself, return to PLOF ?  ?  ?OBJECTIVE:  ? * all objective findings taken at eval unless otherwise noted.  ?  ?  ?SCREENING FOR RED FLAGS: ?Bowel or bladder incontinence: No ?Spinal tumors: No ?Cauda equina syndrome: No ?Compression fracture: No ?Abdominal aneurysm: No ?  ?COGNITION: ?         Overall cognitive status: Within functional limits for tasks assessed              ?          ?SENSATION: ?         Light touch: Deficits Left distal foot decreased sensation.  Wears Left ankle inversion brace  ?          ?  ?MUSCLE LENGTH: ?Hamstrings: WFL ?  ?  ?POSTURE:  ?Forward hip flex, asymmetry: left hip elevation, right shoulder elevation ?  ?  ?  ?LE AROM/PROM: ?  ?A/PROM Right ?08/25/2021 Left ?08/25/2021 Left ?10/09/21  ?Hip flexion       ?Hip extension       ?Hip abduction       ?Hip adduction       ?Hip internal rotation       ?Hip external rotation       ?Knee flexion       ?Knee extension       ?Ankle dorsiflexion 5 -10 AROM: -8? in long sitting ?PROM: 1? in long sitting    ?Ankle plantarflexion full full   ?Ankle inversion       ?Ankle eversion       ? (Blank rows = not tested) ?  ?LE MMT: ?  ?MMT Right ?08/25/2021 Left ?08/25/2021 Right ?10/13/21 Left ?10/13/21  ?Hip flexion 3+ 3- 4 3+  ?Hip extension        ?Hip abduction 4 4 5- 4+  ?Hip adduction 4 4 5- 5-  ?Hip internal rotation        ?Hip external rotation        ?Knee flexion 4 4 5- 5-  ?Knee extension 4 4 5- 5-  ?Ankle dorsiflexion 4 3 5  3+  ?Ankle plantarflexion 4+ 3 5 3+  ?Ankle inversion        ?Ankle eversion      3-  ? (Blank rows = not tested) ?  ?Straight leg raise test:  Negative and Slump test: Negative ?  ?FUNCTIONAL TESTS:  ?5 times sit to stand: 21 using ue from transport chair ?10/09/21: 5x STS: 16.33 sec using UE, on hot tub surround ?Timed up  and go (TUG): 20s ?10/13/21 TUG 16s ?                    6 minute walk test  520f ?         10/13/21 6MWT 853 ft ?GAIT: ?Distance walked: 550 ft ?Assistive device utilized: WEnvironmental consultant- 2 wheeled ?Level of assistance: CGA ?Comments: Forward hip flex, limited heel strike bilaterally R<L, decreased step length and cadence. ?  ?  ?  ?TODAY'S TREATMENT  ?10/08/21 ?Pt seen for aquatic therapy today.  Treatment took place in water 3.25-4.249fin [facility] Temp of water was 93?. Marland KitchenPt entered/exited the pool via stairs step to pattern independently with bilat rail. ? ?Walking forward/ backward/ side stepping x 4 laps of each for warm up ?All exercises completed using 2.5 ankle weights ? ?Seated on 4th step from bottom:  ?flutter kicking (SLR); add/abd 3 x 20 ?STS 4th step x5 without ue support ?STS 3rd step (bottom) with buoyancy ball between knees for increased core engagement minimal use of UE x5 ? ?Balance ?Tandem V and VE ue supported by yellow hand buoys. Pt able to hold after several tries x >15s with VE.  >20 s with V ?SLS Vision only, RLE after several tries x >10s; left with difficulty best try 4s ? ?Supported under arms by yellow noodle in seated position x 5 mins for LB distraction ?-Gentle Lumbar rotation x10 R/L ?-cycling x 2 length of pool ? ?Plank on bench, progressed to blue noodle holding 20-25 s x 3.  Pt limit due to slight stress felt in back ? ?Standing UE supported by yellow hand buoys in [id number] raises/toe raises x 20 ?Hip add/abd; ext x 10 R/L ea ? ?Amb 2 widths between exercises for stretch and recovery when needed ? ?Pt requires buoyancy for support and to offload joints with strengthening exercises. Viscosity of the water is needed for resistance of strengthening; water current perturbations  provides challenge to standing balance unsupported, requiring increased core activation. ? ? ? ?  ?PATIENT EDUCATION:  ?Education details: Properties of water, benefits of aquatic therapy ?Person educated: P

## 2021-10-20 ENCOUNTER — Encounter (HOSPITAL_BASED_OUTPATIENT_CLINIC_OR_DEPARTMENT_OTHER): Payer: Self-pay | Admitting: Physical Therapy

## 2021-10-20 ENCOUNTER — Ambulatory Visit (HOSPITAL_BASED_OUTPATIENT_CLINIC_OR_DEPARTMENT_OTHER): Payer: Medicare Other | Admitting: Physical Therapy

## 2021-10-20 DIAGNOSIS — R262 Difficulty in walking, not elsewhere classified: Secondary | ICD-10-CM

## 2021-10-20 DIAGNOSIS — M6281 Muscle weakness (generalized): Secondary | ICD-10-CM

## 2021-10-20 DIAGNOSIS — G610 Guillain-Barre syndrome: Secondary | ICD-10-CM

## 2021-10-20 NOTE — Therapy (Signed)
?OUTPATIENT PHYSICAL THERAPY TREATMENT NOTE ?Progress Note ?  ? ?Patient Name: Angela Morales ?MRN: [id number] ?DOB:1946-05-11, 76 y.o., female ?Today's Date: 10/20/2021 ? ?PCP: Deland Pretty, MD ?REFERRING PROVIDER: Deland Pretty, MD ? ? PT End of Session - 10/20/21 1135   ? ? Visit Number 12   ? Number of Visits 16   ? Date for PT Re-Evaluation 10/20/21   ? Authorization Time Period 08/25/21 - 10/20/21   ? Progress Note Due on Visit 10   ? PT Start Time 1045   ? PT Stop Time 1125   ? PT Time Calculation (min) 40 min   ? Activity Tolerance Patient tolerated treatment well   ? Behavior During Therapy [facility] for tasks assessed/performed   ? ?  ?  ? ?  ? ? ? ? ? ?Past Medical History:  ?Diagnosis Date  ? Abnormal uterine bleeding   ? on HRT  ? Allergy   ? seasonal  ? Arthritis   ? Atrial fibrillation ([place])   ? Celiac disease   ? DDD (degenerative disc disease)   ? Dysrhythmia   ? GBS (Guillain-Barre syndrome) ([place])   ? Glaucoma   ? Microscopic colitis   ? Osteoarthritis of both knees   ? PONV (postoperative nausea and vomiting)   ? SVT (supraventricular tachycardia) ([place])   ? ?Past Surgical History:  ?Procedure Laterality Date  ? APPENDECTOMY    ? ATRIAL FIBRILLATION ABLATION N/A 01/28/2021  ? Procedure: ATRIAL FIBRILLATION ABLATION;  Surgeon: Constance Haw, MD;  Location: [place] [facility];  Service: Cardiovascular;  Laterality: N/A;  ? CARDIOVERSION N/A 10/30/2020  ? Procedure: CARDIOVERSION;  Surgeon: Jerline Pain, MD;  Location: [facility] ENDOSCOPY;  Service: Cardiovascular;  Laterality: N/A;  ? CARDIOVERSION N/A 03/16/2021  ? Procedure: CARDIOVERSION;  Surgeon: Thayer Headings, MD;  Location: [facility] ENDOSCOPY;  Service: Cardiovascular;  Laterality: N/A;  ? COLONOSCOPY    ? FOOT FRACTURE SURGERY  10/2013  ? TONSILLECTOMY AND ADENOIDECTOMY    ? TOTAL KNEE ARTHROPLASTY Left 03/22/2016  ? Procedure: TOTAL KNEE ARTHROPLASTY;  Surgeon: Dorna Leitz, MD;  Location: [place];  Service: Orthopedics;  Laterality: Left;  ? TUBAL LIGATION   1981  ? VAGINAL HYSTERECTOMY  04/20/05  ? prolapse, adenomyosis  ? ?Patient Active Problem List  ? Diagnosis Date Noted  ? Abnormality of gait 08/14/2021  ? Left foot drop 08/14/2021  ? Wheelchair dependence 05/15/2021  ? Neurogenic bladder 04/09/2021  ? Atrial flutter with rapid ventricular response ([place]) 03/13/2021  ? Urinary retention   ? Chronic pain syndrome   ? GBS (Guillain-Barre syndrome) ([place]) 03/04/2021  ? Sleep disturbance   ? Atelectasis   ? Malnutrition of moderate degree 02/16/2021  ? Elevated white blood cell count   ? Status post tracheostomy ([place])   ? Oropharyngeal dysphagia   ? Acute respiratory failure with hypoxia ([place])   ? GBS (Guillain Barre syndrome) ([place]) 02/06/2021  ? Chronic anticoagulation 02/06/2021  ? Hyponatremia 02/06/2021  ? Prolonged QT interval 02/06/2021  ? Generalized weakness 02/06/2021  ? Secondary hypercoagulable state ([place]) 11/13/2020  ? Primary osteoarthritis of left knee 03/22/2016  ? Atrial fibrillation with RVR ([place]) 12/01/2013  ? Paroxysmal atrial fibrillation ([place]) 11/30/2013  ? Hypotension 11/30/2013  ? Atrial flutter ([place]) 11/30/2013  ? ? ?REFERRING DIAG: M48.062 (ICD-10-CM) - Spinal stenosis of lumbar region with neurogenic claudication ? ?THERAPY DIAG:  ?Muscle weakness (generalized) ? ?Difficulty in walking, not elsewhere classified ? ?GBS (Guillain-Barre syndrome) ([place]) ? ?PERTINENT HISTORY:  hx  of Guillain Barre Syndrome/AIDP with B/L foot drop-  ? also has aflutter- on Eliquis, spinal stenosis. ? ?PRECAUTIONS: resolving foot drop L>R ? ?SUBJECTIVE:  "I am climbing the stairs daily" ? ?PAIN:  ?Are you having pain? yes ?NPRS scale: 2/10 ?Pain location: LEs ?Pain orientation: bilat ?PAIN TYPE: aching ?Pain description:  ?Aggravating factors:  ?Relieving factors: rest ?  ?PRECAUTIONS: Other: resolving foot drop L>R ?  ?WEIGHT BEARING RESTRICTIONS No ?  ?FALLS:  ?Has patient fallen in last 6 months? No,  ?  ?LIVING ENVIRONMENT: ?Lives with: lives with their  family ?Lives in: House/apartment ?Stairs: Yes; External: 15 steps; on right going up ?Has following equipment at home: Single point cane, Walker - 2 wheeled, Wheelchair (manual), and Electronics engineer ?  ?OCCUPATION: retired ?  ?PLOF: Independent prior to GBS Aug 2022 ?  ?PATIENT GOALS walk indep, car for myself, return to PLOF ?  ?  ?OBJECTIVE:  ? * all objective findings taken at eval unless otherwise noted.  ?  ?  ?SCREENING FOR RED FLAGS: ?Bowel or bladder incontinence: No ?Spinal tumors: No ?Cauda equina syndrome: No ?Compression fracture: No ?Abdominal aneurysm: No ?  ?COGNITION: ?         Overall cognitive status: Within functional limits for tasks assessed              ?          ?SENSATION: ?         Light touch: Deficits Left distal foot decreased sensation.  Wears Left ankle inversion brace  ?          ?  ?MUSCLE LENGTH: ?Hamstrings: WFL ?  ?  ?POSTURE:  ?Forward hip flex, asymmetry: left hip elevation, right shoulder elevation ?  ?  ?  ?LE AROM/PROM: ?  ?A/PROM Right ?08/25/2021 Left ?08/25/2021 Left ?10/09/21  ?Hip flexion       ?Hip extension       ?Hip abduction       ?Hip adduction       ?Hip internal rotation       ?Hip external rotation       ?Knee flexion       ?Knee extension       ?Ankle dorsiflexion 5 -10 AROM: -8? in long sitting ?PROM: 1? in long sitting    ?Ankle plantarflexion full full   ?Ankle inversion       ?Ankle eversion       ? (Blank rows = not tested) ?  ?LE MMT: ?  ?MMT Right ?08/25/2021 Left ?08/25/2021 Right ?10/13/21 Left ?10/13/21  ?Hip flexion 3+ 3- 4 3+  ?Hip extension        ?Hip abduction 4 4 5- 4+  ?Hip adduction 4 4 5- 5-  ?Hip internal rotation        ?Hip external rotation        ?Knee flexion 4 4 5- 5-  ?Knee extension 4 4 5- 5-  ?Ankle dorsiflexion 4 3 5  3+  ?Ankle plantarflexion 4+ 3 5 3+  ?Ankle inversion        ?Ankle eversion      3-  ? (Blank rows = not tested) ?  ?Straight leg raise test: Negative and Slump test: Negative ?  ?FUNCTIONAL TESTS:  ?5 times sit to stand: 21  using ue from transport chair ?10/09/21: 5x STS: 16.33 sec using UE, on hot tub surround ?Timed up and go (TUG): 20s ?10/13/21 TUG 16s ?  6 minute walk test  54f ?         10/13/21 6MWT 853 ft ?GAIT: ?Distance walked: 550 ft ?Assistive device utilized: WEnvironmental consultant- 2 wheeled ?Level of assistance: CGA ?Comments: Forward hip flex, limited heel strike bilaterally R<L, decreased step length and cadence. ?  ?  ?  ?TODAY'S TREATMENT  ?10/08/21 ?Pt seen for aquatic therapy today.  Treatment took place in water 3.25-4.273fin [facility] Temp of water was 93?. Marland KitchenPt entered/exited the pool via stairs step to pattern independently with bilat rail. ? ?Walking forward/ backward/ side stepping x 4 laps of each for warm up ?All exercises completed using 2.5 ankle weights ? ?Balance ?Tandem V and VE ue supported by yellow hand buoys. Pt able to hold after several tries x >15s with VE.  ? SLS with V R/L x  >15s , with VE R x 10s, left x9 s  ? ? ?Supported under arms by yellow noodle in seated position x 5 mins for LB distraction ?-cycling x 2 length of pool ? ?Standing ?-step ups R/L x10 decreasing ue support to unilateral ? ?Seated on 4th step from bottom:  ?flutter kicking (SLR); add/abd 3 x 20 ?           STS 4th step x 6 with ue support and emphasis on immediate standing balance ? ?Amb 2 widths between exercises for stretch and recovery when needed ? ?Pt requires buoyancy for support and to offload joints with strengthening exercises. Viscosity of the water is needed for resistance of strengthening; water current perturbations provides challenge to standing balance unsupported, requiring increased core activation. ? ? ? ?  ?PATIENT EDUCATION:  ?Education details: Properties of water, benefits of aquatic therapy ?Person educated: Patient and Caregiver ?Education method: Explanation ?Education comprehension: verbalized understanding ?  ?  ?HOME EXERCISE PROGRAM: ? stair climbing 2-3 x week  with assistance ?Walk distance to pool with each visit. ?HEP: gastroc and hamstring stretching; flutter and add/abd in seated 3x20, STS from 3rd step x5. ?Pt to sleep upstairs 1 x weekly using smaller bathroom, i

## 2021-10-22 ENCOUNTER — Encounter (HOSPITAL_BASED_OUTPATIENT_CLINIC_OR_DEPARTMENT_OTHER): Payer: Self-pay | Admitting: Physical Therapy

## 2021-10-22 ENCOUNTER — Ambulatory Visit (HOSPITAL_BASED_OUTPATIENT_CLINIC_OR_DEPARTMENT_OTHER): Payer: Medicare Other | Admitting: Physical Therapy

## 2021-10-22 DIAGNOSIS — M6281 Muscle weakness (generalized): Secondary | ICD-10-CM

## 2021-10-22 DIAGNOSIS — G610 Guillain-Barre syndrome: Secondary | ICD-10-CM

## 2021-10-22 DIAGNOSIS — R262 Difficulty in walking, not elsewhere classified: Secondary | ICD-10-CM

## 2021-10-22 NOTE — Therapy (Signed)
?OUTPATIENT PHYSICAL THERAPY TREATMENT NOTE ? ?  ? ?Patient Name: Angela Morales ?MRN: [id number] ?DOB:09-Apr-1946, 76 y.o., female ?Today's Date: 10/22/2021 ? ?PCP: Deland Pretty, MD ?REFERRING PROVIDER: Deland Pretty, MD ? ? PT End of Session - 10/22/21 1338   ? ? Visit Number 13   ? Number of Visits 16   ? Date for PT Re-Evaluation 12/08/21   ? Authorization Time Period 08/25/21 - 10/20/21   ? Progress Note Due on Visit 20   ? PT Start Time 1116   ? PT Stop Time 1200   ? PT Time Calculation (min) 44 min   ? Activity Tolerance Patient tolerated treatment well   ? Behavior During Therapy [facility] for tasks assessed/performed   ? ?  ?  ? ?  ? ? ? ? ? ? ?Past Medical History:  ?Diagnosis Date  ? Abnormal uterine bleeding   ? on HRT  ? Allergy   ? seasonal  ? Arthritis   ? Atrial fibrillation ([place])   ? Celiac disease   ? DDD (degenerative disc disease)   ? Dysrhythmia   ? GBS (Guillain-Barre syndrome) ([place])   ? Glaucoma   ? Microscopic colitis   ? Osteoarthritis of both knees   ? PONV (postoperative nausea and vomiting)   ? SVT (supraventricular tachycardia) ([place])   ? ?Past Surgical History:  ?Procedure Laterality Date  ? APPENDECTOMY    ? ATRIAL FIBRILLATION ABLATION N/A 01/28/2021  ? Procedure: ATRIAL FIBRILLATION ABLATION;  Surgeon: Constance Haw, MD;  Location: [facility];  Service: Cardiovascular;  Laterality: N/A;  ? CARDIOVERSION N/A 10/30/2020  ? Procedure: CARDIOVERSION;  Surgeon: Jerline Pain, MD;  Location: [facility] ENDOSCOPY;  Service: Cardiovascular;  Laterality: N/A;  ? CARDIOVERSION N/A 03/16/2021  ? Procedure: CARDIOVERSION;  Surgeon: Thayer Headings, MD;  Location: [facility] ENDOSCOPY;  Service: Cardiovascular;  Laterality: N/A;  ? COLONOSCOPY    ? FOOT FRACTURE SURGERY  10/2013  ? TONSILLECTOMY AND ADENOIDECTOMY    ? TOTAL KNEE ARTHROPLASTY Left 03/22/2016  ? Procedure: TOTAL KNEE ARTHROPLASTY;  Surgeon: Dorna Leitz, MD;  Location: [place];  Service: Orthopedics;  Laterality: Left;  ? TUBAL LIGATION  1981  ?  VAGINAL HYSTERECTOMY  04/20/05  ? prolapse, adenomyosis  ? ?Patient Active Problem List  ? Diagnosis Date Noted  ? Abnormality of gait 08/14/2021  ? Left foot drop 08/14/2021  ? Wheelchair dependence 05/15/2021  ? Neurogenic bladder 04/09/2021  ? Atrial flutter with rapid ventricular response ([place]) 03/13/2021  ? Urinary retention   ? Chronic pain syndrome   ? GBS (Guillain-Barre syndrome) ([place]) 03/04/2021  ? Sleep disturbance   ? Atelectasis   ? Malnutrition of moderate degree 02/16/2021  ? Elevated white blood cell count   ? Status post tracheostomy ([place])   ? Oropharyngeal dysphagia   ? Acute respiratory failure with hypoxia ([place])   ? GBS (Guillain Barre syndrome) ([place]) 02/06/2021  ? Chronic anticoagulation 02/06/2021  ? Hyponatremia 02/06/2021  ? Prolonged QT interval 02/06/2021  ? Generalized weakness 02/06/2021  ? Secondary hypercoagulable state ([place]) 11/13/2020  ? Primary osteoarthritis of left knee 03/22/2016  ? Atrial fibrillation with RVR ([place]) 12/01/2013  ? Paroxysmal atrial fibrillation ([place]) 11/30/2013  ? Hypotension 11/30/2013  ? Atrial flutter ([place]) 11/30/2013  ? ? ?REFERRING DIAG: M48.062 (ICD-10-CM) - Spinal stenosis of lumbar region with neurogenic claudication ? ?THERAPY DIAG:  ?Muscle weakness (generalized) ? ?Difficulty in walking, not elsewhere classified ? ?GBS (Guillain-Barre syndrome) ([place]) ? ?PERTINENT HISTORY:  hx  of Guillain Barre Syndrome/AIDP with B/L foot drop-  ? also has aflutter- on Eliquis, spinal stenosis. ? ?PRECAUTIONS: resolving foot drop L>R ? ?SUBJECTIVE:  "I already have been to the grocery store today am a little tired" ? ?PAIN:  ?Are you having pain? yes ?NPRS scale: 3/10 ?Pain location: LEs ?Pain orientation: bilat ?PAIN TYPE: aching ?Pain description:  ?Aggravating factors:  ?Relieving factors: rest ?  ?PRECAUTIONS: Other: resolving foot drop L>R ?  ?WEIGHT BEARING RESTRICTIONS No ?  ?FALLS:  ?Has patient fallen in last 6 months? No,  ?  ?LIVING ENVIRONMENT: ?Lives with:  lives with their family ?Lives in: House/apartment ?Stairs: Yes; External: 15 steps; on right going up ?Has following equipment at home: Single point cane, Walker - 2 wheeled, Wheelchair (manual), and Electronics engineer ?  ?OCCUPATION: retired ?  ?PLOF: Independent prior to GBS Aug 2022 ?  ?PATIENT GOALS walk indep, car for myself, return to PLOF ?  ?  ?OBJECTIVE:  ? * all objective findings taken at eval unless otherwise noted.  ?  ?  ?SCREENING FOR RED FLAGS: ?Bowel or bladder incontinence: No ?Spinal tumors: No ?Cauda equina syndrome: No ?Compression fracture: No ?Abdominal aneurysm: No ?  ?COGNITION: ?         Overall cognitive status: Within functional limits for tasks assessed              ?          ?SENSATION: ?         Light touch: Deficits Left distal foot decreased sensation.  Wears Left ankle inversion brace  ?          ?  ?MUSCLE LENGTH: ?Hamstrings: WFL ?  ?  ?POSTURE:  ?Forward hip flex, asymmetry: left hip elevation, right shoulder elevation ?  ?  ?  ?LE AROM/PROM: ?  ?A/PROM Right ?08/25/2021 Left ?08/25/2021 Left ?10/09/21  ?Hip flexion       ?Hip extension       ?Hip abduction       ?Hip adduction       ?Hip internal rotation       ?Hip external rotation       ?Knee flexion       ?Knee extension       ?Ankle dorsiflexion 5 -10 AROM: -8? in long sitting ?PROM: 1? in long sitting    ?Ankle plantarflexion full full   ?Ankle inversion       ?Ankle eversion       ? (Blank rows = not tested) ?  ?LE MMT: ?  ?MMT Right ?08/25/2021 Left ?08/25/2021 Right ?10/13/21 Left ?10/13/21  ?Hip flexion 3+ 3- 4 3+  ?Hip extension        ?Hip abduction 4 4 5- 4+  ?Hip adduction 4 4 5- 5-  ?Hip internal rotation        ?Hip external rotation        ?Knee flexion 4 4 5- 5-  ?Knee extension 4 4 5- 5-  ?Ankle dorsiflexion 4 3 5  3+  ?Ankle plantarflexion 4+ 3 5 3+  ?Ankle inversion        ?Ankle eversion      3-  ? (Blank rows = not tested) ?  ?Straight leg raise test: Negative and Slump test: Negative ?  ?FUNCTIONAL TESTS:  ?5 times  sit to stand: 21 using ue from transport chair ?10/09/21: 5x STS: 16.33 sec using UE, on hot tub surround ?Timed up and go (TUG): 20s ?10/13/21 TUG 16s ?  6 minute walk test  522f ?         10/13/21 6MWT 853 ft ?GAIT: ?Distance walked: 550 ft ?Assistive device utilized: WEnvironmental consultant- 2 wheeled ?Level of assistance: CGA ?Comments: Forward hip flex, limited heel strike bilaterally R<L, decreased step length and cadence. ?  ?  ?  ?TODAY'S TREATMENT  ?10/08/21 ?Pt seen for aquatic therapy today.  Treatment took place in water 3.25-4.261fin [facility]. Temp of water was 93?. Marland KitchenPt entered/exited the pool via stairs step to pattern independently with bilat rail. ? ?Walking forward/ backward/ side stepping x 4 laps of each for warm up ?All exercises completed using 2.5 ankle weights ? ? ? ?Seated straddled on yellow noodle ue supported by hand buoys  ?-cycling x 6 widths  ? ?Standing ?-step ups R/L x10 decreasing ue support to unilateral forward x 10; side stepping x 7 ? ?Seated on 4th step from bottom:  ?Adductor squeeze with buoyancy ball x10 hold x 10s ea ?flutter kicking (SLR) x20; add/abd x20 ?           STS 4th step x 6 pushing up with ue from step and emphasis on immediate standing balance ? ?Balance ?-FT VE ue supported on yellow hand buoys 3-4 tris holding best >20s ?-Semi tandem vision leading R/L several tries holding best 15s.  ?-semi tandem VE several tries best hold 10s ? ?Amb 2 widths between exercises for stretch and recovery when needed ? ?Pt requires buoyancy for support and to offload joints with strengthening exercises. Viscosity of the water is needed for resistance of strengthening; water current perturbations provides challenge to standing balance unsupported, requiring increased core activation. ? ? ? ?  ?PATIENT EDUCATION:  ?Education details: Properties of water, benefits of aquatic therapy ?Person educated: Patient and Caregiver ?Education method:  Explanation ?Education comprehension: verbalized understanding ?  ?  ?HOME EXERCISE PROGRAM: ? stair climbing 2-3 x week with assistance ?Walk distance to pool with each visit. ?HEP: gastroc and hamstring stretching;

## 2021-10-27 ENCOUNTER — Ambulatory Visit (HOSPITAL_BASED_OUTPATIENT_CLINIC_OR_DEPARTMENT_OTHER): Payer: Medicare Other | Attending: Physical Medicine and Rehabilitation | Admitting: Physical Therapy

## 2021-10-27 DIAGNOSIS — R262 Difficulty in walking, not elsewhere classified: Secondary | ICD-10-CM | POA: Insufficient documentation

## 2021-10-27 DIAGNOSIS — M6281 Muscle weakness (generalized): Secondary | ICD-10-CM | POA: Insufficient documentation

## 2021-10-27 DIAGNOSIS — G610 Guillain-Barre syndrome: Secondary | ICD-10-CM | POA: Diagnosis present

## 2021-10-27 NOTE — Therapy (Signed)
?OUTPATIENT PHYSICAL THERAPY TREATMENT NOTE ? ?  ? ?Patient Name: Angela Morales ?MRN: [id number] ?DOB:04-20-1946, 76 y.o., female ?Today's Date: 10/27/2021 ? ?PCP: Deland Pretty, MD ?REFERRING PROVIDER: Deland Pretty, MD ? ? PT End of Session - 10/27/21 1116   ? ? Visit Number [id number]   ? Number of Visits 16   ? Date for PT Re-Evaluation 12/08/21   ? Authorization Time Period 08/25/21 - 10/20/21   ? PT Start Time 1100   ? PT Stop Time 1140   ? PT Time Calculation (min) 40 min   ? Activity Tolerance Patient tolerated treatment well   ? Behavior During Therapy [facility] for tasks assessed/performed   ? ?  ?  ? ?  ? ? ? ? ? ? ?Past Medical History:  ?Diagnosis Date  ? Abnormal uterine bleeding   ? on HRT  ? Allergy   ? seasonal  ? Arthritis   ? Atrial fibrillation ([place])   ? Celiac disease   ? DDD (degenerative disc disease)   ? Dysrhythmia   ? GBS (Guillain-Barre syndrome) ([place])   ? Glaucoma   ? Microscopic colitis   ? Osteoarthritis of both knees   ? PONV (postoperative nausea and vomiting)   ? SVT (supraventricular tachycardia) ([place])   ? ?Past Surgical History:  ?Procedure Laterality Date  ? APPENDECTOMY    ? ATRIAL FIBRILLATION ABLATION N/A 01/28/2021  ? Procedure: ATRIAL FIBRILLATION ABLATION;  Surgeon: Constance Haw, MD;  Location: [place] [facility];  Service: Cardiovascular;  Laterality: N/A;  ? CARDIOVERSION N/A 10/30/2020  ? Procedure: CARDIOVERSION;  Surgeon: Jerline Pain, MD;  Location: [facility] ENDOSCOPY;  Service: Cardiovascular;  Laterality: N/A;  ? CARDIOVERSION N/A 03/16/2021  ? Procedure: CARDIOVERSION;  Surgeon: Thayer Headings, MD;  Location: [facility] ENDOSCOPY;  Service: Cardiovascular;  Laterality: N/A;  ? COLONOSCOPY    ? FOOT FRACTURE SURGERY  10/2013  ? TONSILLECTOMY AND ADENOIDECTOMY    ? TOTAL KNEE ARTHROPLASTY Left 03/22/2016  ? Procedure: TOTAL KNEE ARTHROPLASTY;  Surgeon: Dorna Leitz, MD;  Location: [place];  Service: Orthopedics;  Laterality: Left;  ? TUBAL LIGATION  1981  ? VAGINAL HYSTERECTOMY  04/20/05  ?  prolapse, adenomyosis  ? ?Patient Active Problem List  ? Diagnosis Date Noted  ? Abnormality of gait 08/14/2021  ? Left foot drop 08/14/2021  ? Wheelchair dependence 05/15/2021  ? Neurogenic bladder 04/09/2021  ? Atrial flutter with rapid ventricular response ([place]) 03/13/2021  ? Urinary retention   ? Chronic pain syndrome   ? GBS (Guillain-Barre syndrome) ([place]) 03/04/2021  ? Sleep disturbance   ? Atelectasis   ? Malnutrition of moderate degree 02/16/2021  ? Elevated white blood cell count   ? Status post tracheostomy ([place])   ? Oropharyngeal dysphagia   ? Acute respiratory failure with hypoxia ([place])   ? GBS (Guillain Barre syndrome) ([place]) 02/06/2021  ? Chronic anticoagulation 02/06/2021  ? Hyponatremia 02/06/2021  ? Prolonged QT interval 02/06/2021  ? Generalized weakness 02/06/2021  ? Secondary hypercoagulable state ([place]) 11/13/2020  ? Primary osteoarthritis of left knee 03/22/2016  ? Atrial fibrillation with RVR ([place]) 12/01/2013  ? Paroxysmal atrial fibrillation ([place]) 11/30/2013  ? Hypotension 11/30/2013  ? Atrial flutter ([place]) 11/30/2013  ? ? ?REFERRING DIAG: M48.062 (ICD-10-CM) - Spinal stenosis of lumbar region with neurogenic claudication ? ?THERAPY DIAG:  ?Muscle weakness (generalized) ? ?Difficulty in walking, not elsewhere classified ? ?GBS (Guillain-Barre syndrome) ([place]) ? ?PERTINENT HISTORY:  hx of Guillain Barre Syndrome/AIDP with B/L foot drop-  ?  also has aflutter- on Eliquis, spinal stenosis. ? ?PRECAUTIONS: resolving foot drop L>R ? ?SUBJECTIVE:  "I hurt myself this weekend by overdoing the balance exercises in the pool.  My left hip and thigh has been hurting since.  Took Ibuprofen" ? ?PAIN:  ?Are you having pain? yes ?NPRS scale: 8/10 ?Pain location: LEs ?Pain orientation: bilat ?PAIN TYPE: aching ?Pain description:  ?Aggravating factors:  ?Relieving factors: rest ?  ?PRECAUTIONS: Other: resolving foot drop L>R ?  ?WEIGHT BEARING RESTRICTIONS No ?  ?FALLS:  ?Has patient fallen in last 6 months? No,   ?  ?LIVING ENVIRONMENT: ?Lives with: lives with their family ?Lives in: House/apartment ?Stairs: Yes; External: 15 steps; on right going up ?Has following equipment at home: Single point cane, Walker - 2 wheeled, Wheelchair (manual), and Electronics engineer ?  ?OCCUPATION: retired ?  ?PLOF: Independent prior to GBS Aug 2022 ?  ?PATIENT GOALS walk indep, car for myself, return to PLOF ?  ?  ?OBJECTIVE:  ? * all objective findings taken at eval unless otherwise noted.  ?  ?  ?SCREENING FOR RED FLAGS: ?Bowel or bladder incontinence: No ?Spinal tumors: No ?Cauda equina syndrome: No ?Compression fracture: No ?Abdominal aneurysm: No ?  ?COGNITION: ?         Overall cognitive status: Within functional limits for tasks assessed              ?          ?SENSATION: ?         Light touch: Deficits Left distal foot decreased sensation.  Wears Left ankle inversion brace  ?          ?  ?MUSCLE LENGTH: ?Hamstrings: WFL ?  ?  ?POSTURE:  ?Forward hip flex, asymmetry: left hip elevation, right shoulder elevation ?  ?  ?  ?LE AROM/PROM: ?  ?A/PROM Right ?08/25/2021 Left ?08/25/2021 Left ?10/09/21  ?Hip flexion       ?Hip extension       ?Hip abduction       ?Hip adduction       ?Hip internal rotation       ?Hip external rotation       ?Knee flexion       ?Knee extension       ?Ankle dorsiflexion 5 -10 AROM: -8? in long sitting ?PROM: 1? in long sitting    ?Ankle plantarflexion full full   ?Ankle inversion       ?Ankle eversion       ? (Blank rows = not tested) ?  ?LE MMT: ?  ?MMT Right ?08/25/2021 Left ?08/25/2021 Right ?10/13/21 Left ?10/13/21  ?Hip flexion 3+ 3- 4 3+  ?Hip extension        ?Hip abduction 4 4 5- 4+  ?Hip adduction 4 4 5- 5-  ?Hip internal rotation        ?Hip external rotation        ?Knee flexion 4 4 5- 5-  ?Knee extension 4 4 5- 5-  ?Ankle dorsiflexion 4 3 5  3+  ?Ankle plantarflexion 4+ 3 5 3+  ?Ankle inversion        ?Ankle eversion      3-  ? (Blank rows = not tested) ?  ?Straight leg raise test: Negative and Slump test:  Negative ?  ?FUNCTIONAL TESTS:  ?5 times sit to stand: 21 using ue from transport chair ?10/09/21: 5x STS: 16.33 sec using UE, on hot tub surround ?Timed up and go (TUG): 20s ?  10/13/21 TUG 16s ?                    6 minute walk test  530f ?         10/13/21 6MWT 853 ft ?GAIT: ?Distance walked: 550 ft ?Assistive device utilized: WEnvironmental consultant- 2 wheeled ?Level of assistance: CGA ?Comments: Forward hip flex, limited heel strike bilaterally R<L, decreased step length and cadence. ?  ?  ?  ?TODAY'S TREATMENT  ?10/08/21 ?Pt seen for aquatic therapy today.  Treatment took place in water 3.25-4.224fin [facility]. Temp of water was 93?. Marland KitchenPt entered/exited the pool via stairs step to pattern independently with bilat rail. ? ?Walking forward/ backward/ side stepping x 4 laps of each for warm up ?  ?Seated on 4th step from bottom:  ?flutter kicking (SLR) 3x20; add/abd 3x20.  Slowed pace for comfort of r hip ?Stretching glutes/piriformis/ext hip rotators ?Standing ? Runners stretch hamstrings and gastroc 3x20s ? Addct stretch foot on second step. ?Step ups R/L x5 bottom step ? ? ?Seated straddled on yellow noodle ue supported by hand buoys  ?-cycling x 2-3widths  ? ?Pt requires buoyancy for support and to offload joints with strengthening exercises. Viscosity of the water is needed for resistance of strengthening; water current perturbations provides challenge to standing balance unsupported, requiring increased core activation. ? ?Manual: deep tissue with stretching and elongation of adductor x 10  minutes.  Instruction given to cg for completion at home. ? ? ? ?  ?PATIENT EDUCATION:  ?Education details: Properties of water, benefits of aquatic therapy ?Person educated: Patient and Caregiver ?Education method: Explanation ?Education comprehension: verbalized understanding ?  ?  ?HOME EXERCISE PROGRAM: ? stair climbing 2-3 x week with assistance ?Walk distance to pool with each visit. ?HEP: gastroc and  hamstring stretching; flutter and add/abd in seated 3x20, STS from 3rd step x5. ?Pt to sleep upstairs 1 x weekly using smaller bathroom, increase stair climbing to 4xweekly ? ?  ?ASSESSMENT: ?  ?CLINICAL IMPRESSIO

## 2021-10-28 ENCOUNTER — Ambulatory Visit: Payer: Medicare Other | Admitting: Neurology

## 2021-10-28 ENCOUNTER — Encounter: Payer: Self-pay | Admitting: Neurology

## 2021-10-28 VITALS — BP 124/66 | HR 69 | Ht 63.0 in | Wt 140.0 lb

## 2021-10-28 DIAGNOSIS — R208 Other disturbances of skin sensation: Secondary | ICD-10-CM

## 2021-10-28 DIAGNOSIS — G610 Guillain-Barre syndrome: Secondary | ICD-10-CM | POA: Diagnosis not present

## 2021-10-28 DIAGNOSIS — R200 Anesthesia of skin: Secondary | ICD-10-CM

## 2021-10-28 DIAGNOSIS — R269 Unspecified abnormalities of gait and mobility: Secondary | ICD-10-CM

## 2021-10-28 MED ORDER — METHOCARBAMOL 500 MG PO TABS: 500.0000 mg | ORAL_TABLET | Freq: Four times a day (QID) | ORAL | 1 refills | Status: DC | PRN

## 2021-10-28 NOTE — Progress Notes (Signed)
? ?[facility] ? ?PATIENT: Angela Morales ?DOB: [birth year] ? ?REFERRING DOCTOR OR PCP:   Thayer Jew. Pharr MD (PCP); Reesa Chew, PA-C ?SOURCE: Patient, notes from prolonged hospital stay, imaging and lab reports. ? ?_________________________________ ? ? ?HISTORICAL ? ?CHIEF COMPLAINT:  ?Chief Complaint  ?Patient presents with  ? Follow-up  ?  Rm 1, w caregiver. Here for 4 month f/u. Pt reports doing well w mobility. Using a cane today, walker only w rough terrain. Able to dress herself and working on balance doing aqua therapy.   ? ? ?HISTORY OF PRESENT ILLNESS:  ?Angela Morales is a 76 year old woman with sequela of Guillain-Barr? syndrome.   ? ?UPDATE 10/28/2021: ?She is doing PT and aqua therapy and is noting some further improvement.   She is now using a cane mostly and a walker at times.    She got enough back proprioception back in the right leg to drive again. ? ?She has some tingling in her feet but this is not painful.   Even when barefoot, she feels lie her socks are off.   She is still on gabapenti but just at night and sleeps well.   She no longer takes hydrocodone .        ? ?Last week, while exercising, she had the onset of left hip pain from a sprain..    She has better but it is adding to her left-sided weakness. ? ? ?History of GBS: ?She was diagnosed with GBS after presenting with a rapid ascending numbness 02/05/2021.   She presented to the hospital and was admitted.  She was on a respirator x 21 days, unresponsive for most of that time.   She needed a tracheostomy and feeding tube.   She also had blood pressure spikes and worsening of her AFib.   After improving, she went to Rehab x a couple weeks (interrupted due to need for amiodarone and then cardioversion for  AFib.  She spent a total of 61 days in the hospital and rehab.     Her GBS was treated with IVIg.     She was discharged 04/08/2021.    ? ? ?MRI of the brain 02/09/2021 showed mild chronic microvascular ischemic change  but no acute findings. ? ?MRI of the lumbar spine 02/06/2021 showed normal nerve root/cauda equina.  She had multilevel degenerative changes with moderate to severe foraminal narrowing and lateral recess stenosis to the right at L3-L4 that could affect the L3 and L4 nerve roots, foraminal narrowing to the left at L4-L5 that could affect the left L4 nerve root and foraminal narrowing to the left at L5-S1 that could affect the left L5 nerve root. ? ?REVIEW OF SYSTEMS: ?Constitutional: No fevers, chills, sweats, or change in appetite ?Eyes: No visual changes, double vision, eye pain ?Ear, nose and throat: No hearing loss, ear pain, nasal congestion, sore throat ?Cardiovascular: No chest pain, palpitations ?Respiratory:  No shortness of breath at rest or with exertion.   No wheezes ?GastrointestinaI: No nausea, vomiting, diarrhea, abdominal pain, fecal incontinence ?Genitourinary:  No dysuria, urinary retention or frequency.  No nocturia. ?Musculoskeletal:  No neck pain,   She has  back pain ?Integumentary: No rash, pruritus, skin lesions ?Neurological: as above ?Psychiatric: No depression at this time.  No anxiety ?Endocrine: No palpitations, diaphoresis, change in appetite, change in weigh or increased thirst ?Hematologic/Lymphatic:  No anemia, purpura, petechiae. ?Allergic/Immunologic: No itchy/runny eyes, nasal congestion, recent allergic reactions, rashes ? ?ALLERGIES: ?Allergies  ?Allergen Reactions  ? Gluten  Meal Other (See Comments)  ?  celiac disease  ? Lactose Intolerance (Gi) Other (See Comments)  ?  Gas and bloating  ? Cefdinir Rash  ? ? ?HOME MEDICATIONS: ? ?Current Outpatient Medications:  ?  acetaminophen (TYLENOL) 500 MG tablet, Take 500 mg by mouth every 6 (six) hours as needed., Disp: , Rfl:  ?  amiodarone (PACERONE) 200 MG tablet, Take 0.5 tablets (100 mg total) by mouth daily., Disp: 45 tablet, Rfl: 2 ?  ELIQUIS 5 MG TABS tablet, TAKE 1 TABLET BY MOUTH TWICE A DAY, Disp: 180 tablet, Rfl: 1 ?   gabapentin (NEURONTIN) 300 MG capsule, TAKE 1 CAPSULE BY MOUTH DAILY AT BEDTIME, Disp: 30 capsule, Rfl: 5 ?  HYDROcodone-acetaminophen (NORCO) 5-325 MG tablet, Take 1 tablet by mouth 3 (three) times daily as needed for moderate pain., Disp: 90 tablet, Rfl: 0 ?  levothyroxine (SYNTHROID) 50 MCG tablet, Take 50 mcg by mouth daily before breakfast., Disp: , Rfl:  ?  polyethylene glycol (MIRALAX / GLYCOLAX) 17 g packet, Take 17 g by mouth daily as needed. (Patient taking differently: Take 17 g by mouth daily as needed (constipation).), Disp: 14 each, Rfl: 0 ?  polyvinyl alcohol (LIQUIFILM TEARS) 1.4 % ophthalmic solution, Place 1 drop into both eyes as needed for dry eyes., Disp: 15 mL, Rfl: 0 ?  potassium chloride SA (KLOR-CON) 20 MEQ tablet, Take 3 tablets (60 mEq total) by mouth daily., Disp: 90 tablet, Rfl: 0 ?  tamsulosin (FLOMAX) 0.4 MG CAPS capsule, TAKE 2 CAPSULES (0.8 MG TOTAL) BY MOUTH DAILY AFTER SUPPER. (Patient taking differently: Take 0.4 mg by mouth daily after supper.), Disp: 60 capsule, Rfl: 5 ?  timolol (TIMOPTIC) 0.5 % ophthalmic solution, Place 1 drop into the left eye 2 (two) times daily., Disp: 10 mL, Rfl:  ?  ZIOPTAN 0.0015 % SOLN, Place 1 drop into the left eye at bedtime., Disp: , Rfl:  ?  methocarbamol (ROBAXIN) 500 MG tablet, Take 1 tablet (500 mg total) by mouth 4 (four) times daily as needed., Disp: 120 tablet, Rfl: 1 ? ?PAST MEDICAL HISTORY: ?Past Medical History:  ?Diagnosis Date  ? Abnormal uterine bleeding   ? on HRT  ? Allergy   ? seasonal  ? Arthritis   ? Atrial fibrillation ([place])   ? Celiac disease   ? DDD (degenerative disc disease)   ? Dysrhythmia   ? GBS (Guillain-Barre syndrome) ([place])   ? Glaucoma   ? Microscopic colitis   ? Osteoarthritis of both knees   ? PONV (postoperative nausea and vomiting)   ? SVT (supraventricular tachycardia) ([place])   ? ? ?PAST SURGICAL HISTORY: ?Past Surgical History:  ?Procedure Laterality Date  ? APPENDECTOMY    ? ATRIAL FIBRILLATION ABLATION N/A  01/28/2021  ? Procedure: ATRIAL FIBRILLATION ABLATION;  Surgeon: Constance Haw, MD;  Location: [place] [facility];  Service: Cardiovascular;  Laterality: N/A;  ? CARDIOVERSION N/A 10/30/2020  ? Procedure: CARDIOVERSION;  Surgeon: Jerline Pain, MD;  Location: [facility] ENDOSCOPY;  Service: Cardiovascular;  Laterality: N/A;  ? CARDIOVERSION N/A 03/16/2021  ? Procedure: CARDIOVERSION;  Surgeon: Thayer Headings, MD;  [facility]: [facility] ENDOSCOPY;  Service: Cardiovascular;  Laterality: N/A;  ? COLONOSCOPY    ? FOOT FRACTURE SURGERY  10/2013  ? TONSILLECTOMY AND ADENOIDECTOMY    ? TOTAL KNEE ARTHROPLASTY Left 03/22/2016  ? Procedure: TOTAL KNEE ARTHROPLASTY;  Surgeon: Dorna Leitz, MD;  Location: [place];  Service: Orthopedics;  Laterality: Left;  ? TUBAL LIGATION  1981  ?  VAGINAL HYSTERECTOMY  04/20/05  ? prolapse, adenomyosis  ? ? ?FAMILY HISTORY: ?Family History  ?Problem Relation Age of Onset  ? Osteoporosis Mother   ? Stroke Mother   ?     mini  ? Prostate cancer Father   ? Heart disease Brother 35  ?     shunt put in heart  ? Osteoarthritis Maternal Grandmother   ? Bipolar disorder Son   ? Liver disease Son   ? Breast cancer Cousin   ?     1st cousin  ? Colon cancer Neg Hx   ? ? ?SOCIAL HISTORY: ? ?Social History  ? ?Socioeconomic History  ? Marital status: Married  ?  Spouse name: [name]  ? Number of children: 3  ? Years of education: Not on file  ? Highest education level: Not on file  ?Occupational History  ? Occupation: retired  ?Tobacco Use  ? Smoking status: Never  ? Smokeless tobacco: Never  ?Vaping Use  ? Vaping Use: Never used  ?Substance and Sexual Activity  ? Alcohol use: Yes  ?  Alcohol/week: 4.0 - 6.0 standard drinks  ?  Types: 4 - 6 Glasses of wine per week  ?  Comment: occasional  ? Drug use: No  ? Sexual activity: Yes  ?  Partners: Male  ?  Birth control/protection: Surgical  ?  Comment: TVH  ?Other Topics Concern  ? Not on file  ?Social History Narrative  ? Lives with husband and son  ? Left handed  ? Caffeine: 1  cup of coffee a day  ? ?Social Determinants of Health  ? ?Financial Resource Strain: Not on file  ?Food Insecurity: Not on file  ?Transportation Needs: Not on file  ?Physical Activity: Not on file  ?Stress: Not on

## 2021-10-29 ENCOUNTER — Encounter (HOSPITAL_BASED_OUTPATIENT_CLINIC_OR_DEPARTMENT_OTHER): Payer: Self-pay | Admitting: Physical Therapy

## 2021-10-29 ENCOUNTER — Ambulatory Visit (HOSPITAL_BASED_OUTPATIENT_CLINIC_OR_DEPARTMENT_OTHER): Payer: Medicare Other | Admitting: Physical Therapy

## 2021-10-29 DIAGNOSIS — M6281 Muscle weakness (generalized): Secondary | ICD-10-CM

## 2021-10-29 DIAGNOSIS — G610 Guillain-Barre syndrome: Secondary | ICD-10-CM

## 2021-10-29 DIAGNOSIS — R262 Difficulty in walking, not elsewhere classified: Secondary | ICD-10-CM

## 2021-10-29 NOTE — Therapy (Signed)
?OUTPATIENT PHYSICAL THERAPY TREATMENT NOTE ? ?  ? ?Patient Name: Angela Morales ?MRN: [id number] ?DOB:Dec 29, 1945, 76 y.o., female ?Today's Date: 10/29/2021 ? ?PCP: Deland Pretty, MD ?REFERRING PROVIDER: Deland Pretty, MD ? ? PT End of Session - 10/29/21 0916   ? ? Visit Number 15   ? Number of Visits 26   ? Date for PT Re-Evaluation 12/08/21   ? Authorization Time Period 10/13/21-12/08/21   ? PT Start Time 0900   ? PT Stop Time 5093   ? PT Time Calculation (min) 45 min   ? Activity Tolerance Patient tolerated treatment well   ? Behavior During Therapy [facility] for tasks assessed/performed   ? ?  ?  ? ?  ? ? ? ? ? ? ?Past Medical History:  ?Diagnosis Date  ? Abnormal uterine bleeding   ? on HRT  ? Allergy   ? seasonal  ? Arthritis   ? Atrial fibrillation ([place])   ? Celiac disease   ? DDD (degenerative disc disease)   ? Dysrhythmia   ? GBS (Guillain-Barre syndrome) ([place])   ? Glaucoma   ? Microscopic colitis   ? Osteoarthritis of both knees   ? PONV (postoperative nausea and vomiting)   ? SVT (supraventricular tachycardia) ([place])   ? ?Past Surgical History:  ?Procedure Laterality Date  ? APPENDECTOMY    ? ATRIAL FIBRILLATION ABLATION N/A 01/28/2021  ? Procedure: ATRIAL FIBRILLATION ABLATION;  Surgeon: Constance Haw, MD;  Location: [place] [facility];  Service: Cardiovascular;  Laterality: N/A;  ? CARDIOVERSION N/A 10/30/2020  ? Procedure: CARDIOVERSION;  Surgeon: Jerline Pain, MD;  Location: [facility] ENDOSCOPY;  Service: Cardiovascular;  Laterality: N/A;  ? CARDIOVERSION N/A 03/16/2021  ? Procedure: CARDIOVERSION;  Surgeon: Thayer Headings, MD;  Location: [facility] ENDOSCOPY;  Service: Cardiovascular;  Laterality: N/A;  ? COLONOSCOPY    ? FOOT FRACTURE SURGERY  10/2013  ? TONSILLECTOMY AND ADENOIDECTOMY    ? TOTAL KNEE ARTHROPLASTY Left 03/22/2016  ? Procedure: TOTAL KNEE ARTHROPLASTY;  Surgeon: Dorna Leitz, MD;  Location: [place];  Service: Orthopedics;  Laterality: Left;  ? TUBAL LIGATION  1981  ? VAGINAL HYSTERECTOMY  04/20/05  ?  prolapse, adenomyosis  ? ?Patient Active Problem List  ? Diagnosis Date Noted  ? Abnormality of gait 08/14/2021  ? Left foot drop 08/14/2021  ? Wheelchair dependence 05/15/2021  ? Neurogenic bladder 04/09/2021  ? Atrial flutter with rapid ventricular response ([place]) 03/13/2021  ? Urinary retention   ? Chronic pain syndrome   ? GBS (Guillain-Barre syndrome) ([place]) 03/04/2021  ? Sleep disturbance   ? Atelectasis   ? Malnutrition of moderate degree 02/16/2021  ? Elevated white blood cell count   ? Status post tracheostomy ([place])   ? Oropharyngeal dysphagia   ? Acute respiratory failure with hypoxia ([place])   ? GBS (Guillain Barre syndrome) ([place]) 02/06/2021  ? Chronic anticoagulation 02/06/2021  ? Hyponatremia 02/06/2021  ? Prolonged QT interval 02/06/2021  ? Generalized weakness 02/06/2021  ? Secondary hypercoagulable state ([place]) 11/13/2020  ? Primary osteoarthritis of left knee 03/22/2016  ? Atrial fibrillation with RVR ([place]) 12/01/2013  ? Paroxysmal atrial fibrillation ([place]) 11/30/2013  ? Hypotension 11/30/2013  ? Atrial flutter ([place]) 11/30/2013  ? ? ?REFERRING DIAG: M48.062 (ICD-10-CM) - Spinal stenosis of lumbar region with neurogenic claudication ? ?THERAPY DIAG:  ?Muscle weakness (generalized) ? ?Difficulty in walking, not elsewhere classified ? ?GBS (Guillain-Barre syndrome) ([place]) ? ?PERTINENT HISTORY:  hx of Guillain Barre Syndrome/AIDP with B/L foot drop-  ? also  has aflutter- on Eliquis, spinal stenosis. ? ?PRECAUTIONS: resolving foot drop L>R ? ?SUBJECTIVE:  "I hurt myself this weekend by overdoing the balance exercises in the pool.  My left hip and thigh has been hurting since.  Took Ibuprofen" ? ?PAIN:  ?Are you having pain? yes ?NPRS scale: 3/10 ?Pain location: LEs ?Pain orientation: bilat ?PAIN TYPE: aching ?Pain description:  ?Aggravating factors:  ?Relieving factors: rest ?  ?PRECAUTIONS: Other: resolving foot drop L>R ?  ?WEIGHT BEARING RESTRICTIONS No ?  ?FALLS:  ?Has patient fallen in last 6 months? No,   ?  ?LIVING ENVIRONMENT: ?Lives with: lives with their family ?Lives in: House/apartment ?Stairs: Yes; External: 15 steps; on right going up ?Has following equipment at home: Single point cane, Walker - 2 wheeled, Wheelchair (manual), and Electronics engineer ?  ?OCCUPATION: retired ?  ?PLOF: Independent prior to GBS Aug 2022 ?  ?PATIENT GOALS walk indep, car for myself, return to PLOF ?  ?  ?OBJECTIVE:  ? * all objective findings taken at eval unless otherwise noted.  ?  ?  ?SCREENING FOR RED FLAGS: ?Bowel or bladder incontinence: No ?Spinal tumors: No ?Cauda equina syndrome: No ?Compression fracture: No ?Abdominal aneurysm: No ?  ?COGNITION: ?         Overall cognitive status: Within functional limits for tasks assessed              ?          ?SENSATION: ?         Light touch: Deficits Left distal foot decreased sensation.  Wears Left ankle inversion brace  ?          ?  ?MUSCLE LENGTH: ?Hamstrings: WFL ?  ?  ?POSTURE:  ?Forward hip flex, asymmetry: left hip elevation, right shoulder elevation ?  ?  ?  ?LE AROM/PROM: ?  ?A/PROM Right ?08/25/2021 Left ?08/25/2021 Left ?10/09/21  ?Hip flexion       ?Hip extension       ?Hip abduction       ?Hip adduction       ?Hip internal rotation       ?Hip external rotation       ?Knee flexion       ?Knee extension       ?Ankle dorsiflexion 5 -10 AROM: -8? in long sitting ?PROM: 1? in long sitting    ?Ankle plantarflexion full full   ?Ankle inversion       ?Ankle eversion       ? (Blank rows = not tested) ?  ?LE MMT: ?  ?MMT Right ?08/25/2021 Left ?08/25/2021 Right ?10/13/21 Left ?10/13/21  ?Hip flexion 3+ 3- 4 3+  ?Hip extension        ?Hip abduction 4 4 5- 4+  ?Hip adduction 4 4 5- 5-  ?Hip internal rotation        ?Hip external rotation        ?Knee flexion 4 4 5- 5-  ?Knee extension 4 4 5- 5-  ?Ankle dorsiflexion 4 3 5  3+  ?Ankle plantarflexion 4+ 3 5 3+  ?Ankle inversion        ?Ankle eversion      3-  ? (Blank rows = not tested) ?  ?Straight leg raise test: Negative and Slump test:  Negative ?  ?FUNCTIONAL TESTS:  ?5 times sit to stand: 21 using ue from transport chair ?10/09/21: 5x STS: 16.33 sec using UE, on hot tub surround ?Timed up and go (TUG): 20s ?10/13/21  TUG 16s ?                    6 minute walk test  595f ?         10/13/21 6MWT 853 ft ?GAIT: ?Distance walked: 550 ft ?Assistive device utilized: WEnvironmental consultant- 2 wheeled ?Level of assistance: CGA ?Comments: Forward hip flex, limited heel strike bilaterally R<L, decreased step length and cadence. ?  ?  ?  ?TODAY'S TREATMENT  ?10/08/21 ?Pt seen for aquatic therapy today.  Treatment took place in [facility].25-4.26fin [facility] Temp of water was 93?. Marland KitchenPt entered/exited the pool via stairs step to pattern independently with bilat rail. ? ?Walking forward/ backward/ side stepping x 4 laps of each for warm up ? ?Standing ?Core engagement pushing blue noodle down then progressed to squoodle 10s x 10ea ?-kick board push pull 2x10.  Some discomfort in arthritic thumbs with holding board ?Left hip flex stretch holding to wall 3x20s hold.  Not tolerated on right due to discomfort in LB with position. ? ?Seated on 4th step from bottom:  ?flutter kicking (SLR) 3x25; add/abd 3x20.  Increased speed as improved left hip toleration ?STS from 3 step x5. Pt rises 3/5x gaining immediate standing balance indep ?Addct sets with buoyancy ball ?STS with ball between knees x 5 core increased core engagement ? ?Seated straddled on yellow noodle ue supported by hand buoys  ?-cycling x 2-3widths  ?-Skiiing with pause to stretch left hip flexors ?Pt requires buoyancy for support and to offload joints with strengthening exercises. Viscosity of the water is needed for resistance of strengthening; water current perturbations provides challenge to standing balance unsupported, requiring increased core activation. ? ? ? ? ? ?  ?PATIENT EDUCATION:  ?Education details: Properties of water, benefits of aquatic therapy ?Person educated: Patient and  Caregiver ?Education method: Explanation ?Education comprehension: verbalized understanding ?  ?  ?HOME EXERCISE PROGRAM: ? stair climbing 2-3 x week with assistance ?Walk distance to pool with each visit. ?HEP: g

## 2021-11-03 ENCOUNTER — Encounter (HOSPITAL_BASED_OUTPATIENT_CLINIC_OR_DEPARTMENT_OTHER): Payer: Self-pay | Admitting: Physical Therapy

## 2021-11-03 ENCOUNTER — Ambulatory Visit (HOSPITAL_BASED_OUTPATIENT_CLINIC_OR_DEPARTMENT_OTHER): Payer: Medicare Other | Admitting: Physical Therapy

## 2021-11-03 DIAGNOSIS — M6281 Muscle weakness (generalized): Secondary | ICD-10-CM | POA: Diagnosis not present

## 2021-11-03 DIAGNOSIS — R262 Difficulty in walking, not elsewhere classified: Secondary | ICD-10-CM

## 2021-11-03 DIAGNOSIS — G610 Guillain-Barre syndrome: Secondary | ICD-10-CM

## 2021-11-03 NOTE — Therapy (Signed)
?OUTPATIENT PHYSICAL THERAPY TREATMENT NOTE ? ?  ? ?Patient Name: Angela Morales ?MRN: [id number] ?DOB:11/23/1945, 76 y.o., female ?Today's Date: 11/03/2021 ? ?PCP: Deland Pretty, MD ?REFERRING PROVIDER: Deland Pretty, MD ? ? PT End of Session - 11/03/21 0904   ? ? Visit Number 16   ? Number of Visits 26   ? Date for PT Re-Evaluation 12/08/21   ? Authorization Time Period 10/13/21-12/08/21   ? PT Start Time 0900   ? PT Stop Time 0940   ? PT Time Calculation (min) 40 min   ? Activity Tolerance Patient tolerated treatment well   ? Behavior During Therapy [facility] for tasks assessed/performed   ? ?  ?  ? ?  ? ? ? ? ? ? ?Past Medical History:  ?Diagnosis Date  ? Abnormal uterine bleeding   ? on HRT  ? Allergy   ? seasonal  ? Arthritis   ? Atrial fibrillation ([place])   ? Celiac disease   ? DDD (degenerative disc disease)   ? Dysrhythmia   ? GBS (Guillain-Barre syndrome) ([place])   ? Glaucoma   ? Microscopic colitis   ? Osteoarthritis of both knees   ? PONV (postoperative nausea and vomiting)   ? SVT (supraventricular tachycardia) ([place])   ? ?Past Surgical History:  ?Procedure Laterality Date  ? APPENDECTOMY    ? ATRIAL FIBRILLATION ABLATION N/A 01/28/2021  ? Procedure: ATRIAL FIBRILLATION ABLATION;  Surgeon: Constance Haw, MD;  Location: [place] [facility];  Service: Cardiovascular;  Laterality: N/A;  ? CARDIOVERSION N/A 10/30/2020  ? Procedure: CARDIOVERSION;  Surgeon: Jerline Pain, MD;  Location: [facility] ENDOSCOPY;  Service: Cardiovascular;  Laterality: N/A;  ? CARDIOVERSION N/A 03/16/2021  ? Procedure: CARDIOVERSION;  Surgeon: Thayer Headings, MD;  Location: [facility] ENDOSCOPY;  Service: Cardiovascular;  Laterality: N/A;  ? COLONOSCOPY    ? FOOT FRACTURE SURGERY  10/2013  ? TONSILLECTOMY AND ADENOIDECTOMY    ? TOTAL KNEE ARTHROPLASTY Left 03/22/2016  ? Procedure: TOTAL KNEE ARTHROPLASTY;  Surgeon: Dorna Leitz, MD;  Location: [place];  Service: Orthopedics;  Laterality: Left;  ? TUBAL LIGATION  1981  ? VAGINAL HYSTERECTOMY  04/20/05  ?  prolapse, adenomyosis  ? ?Patient Active Problem List  ? Diagnosis Date Noted  ? Abnormality of gait 08/14/2021  ? Left foot drop 08/14/2021  ? Wheelchair dependence 05/15/2021  ? Neurogenic bladder 04/09/2021  ? Atrial flutter with rapid ventricular response ([place]) 03/13/2021  ? Urinary retention   ? Chronic pain syndrome   ? GBS (Guillain-Barre syndrome) ([place]) 03/04/2021  ? Sleep disturbance   ? Atelectasis   ? Malnutrition of moderate degree 02/16/2021  ? Elevated white blood cell count   ? Status post tracheostomy ([place])   ? Oropharyngeal dysphagia   ? Acute respiratory failure with hypoxia ([place])   ? GBS (Guillain Barre syndrome) ([place]) 02/06/2021  ? Chronic anticoagulation 02/06/2021  ? Hyponatremia 02/06/2021  ? Prolonged QT interval 02/06/2021  ? Generalized weakness 02/06/2021  ? Secondary hypercoagulable state ([place]) 11/13/2020  ? Primary osteoarthritis of left knee 03/22/2016  ? Atrial fibrillation with RVR ([place]) 12/01/2013  ? Paroxysmal atrial fibrillation ([place]) 11/30/2013  ? Hypotension 11/30/2013  ? Atrial flutter ([place]) 11/30/2013  ? ? ?REFERRING DIAG: M48.062 (ICD-10-CM) - Spinal stenosis of lumbar region with neurogenic claudication ? ?THERAPY DIAG:  ?Muscle weakness (generalized) ? ?Difficulty in walking, not elsewhere classified ? ?GBS (Guillain-Barre syndrome) ([place]) ? ?PERTINENT HISTORY:  hx of Guillain Barre Syndrome/AIDP with B/L foot drop-  ? also  has aflutter- on Eliquis, spinal stenosis. ? ?PRECAUTIONS: resolving foot drop L>R ? ?SUBJECTIVE:  "I hurt myself this weekend by overdoing the balance exercises in the pool.  My left hip and thigh has been hurting since.  Took Ibuprofen" ? ?PAIN:  ?Are you having pain? yes ?NPRS scale: 3/10 ?Pain location: LEs ?Pain orientation: bilat ?PAIN TYPE: aching ?Pain description:  ?Aggravating factors:  ?Relieving factors: rest ?  ?PRECAUTIONS: Other: resolving foot drop L>R ?  ?WEIGHT BEARING RESTRICTIONS No ?  ?FALLS:  ?Has patient fallen in last 6 months? No,   ?  ?LIVING ENVIRONMENT: ?Lives with: lives with their family ?Lives in: House/apartment ?Stairs: Yes; External: 15 steps; on right going up ?Has following equipment at home: Single point cane, Walker - 2 wheeled, Wheelchair (manual), and Electronics engineer ?  ?OCCUPATION: retired ?  ?PLOF: Independent prior to GBS Aug 2022 ?  ?PATIENT GOALS walk indep, car for myself, return to PLOF ?  ?  ?OBJECTIVE:  ? * all objective findings taken at eval unless otherwise noted.  ?  ?  ?SCREENING FOR RED FLAGS: ?Bowel or bladder incontinence: No ?Spinal tumors: No ?Cauda equina syndrome: No ?Compression fracture: No ?Abdominal aneurysm: No ?  ?COGNITION: ?         Overall cognitive status: Within functional limits for tasks assessed              ?          ?SENSATION: ?         Light touch: Deficits Left distal foot decreased sensation.  Wears Left ankle inversion brace  ?          ?  ?MUSCLE LENGTH: ?Hamstrings: WFL ?  ?  ?POSTURE:  ?Forward hip flex, asymmetry: left hip elevation, right shoulder elevation ?  ?  ?  ?LE AROM/PROM: ?  ?A/PROM Right ?08/25/2021 Left ?08/25/2021 Left ?10/09/21  ?Hip flexion       ?Hip extension       ?Hip abduction       ?Hip adduction       ?Hip internal rotation       ?Hip external rotation       ?Knee flexion       ?Knee extension       ?Ankle dorsiflexion 5 -10 AROM: -8? in long sitting ?PROM: 1? in long sitting    ?Ankle plantarflexion full full   ?Ankle inversion       ?Ankle eversion       ? (Blank rows = not tested) ?  ?LE MMT: ?  ?MMT Right ?08/25/2021 Left ?08/25/2021 Right ?10/13/21 Left ?10/13/21  ?Hip flexion 3+ 3- 4 3+  ?Hip extension        ?Hip abduction 4 4 5- 4+  ?Hip adduction 4 4 5- 5-  ?Hip internal rotation        ?Hip external rotation        ?Knee flexion 4 4 5- 5-  ?Knee extension 4 4 5- 5-  ?Ankle dorsiflexion 4 3 5  3+  ?Ankle plantarflexion 4+ 3 5 3+  ?Ankle inversion        ?Ankle eversion      3-  ? (Blank rows = not tested) ?  ?Straight leg raise test: Negative and Slump test:  Negative ?  ?FUNCTIONAL TESTS:  ?5 times sit to stand: 21 using ue from transport chair ?10/09/21: 5x STS: 16.33 sec using UE, on hot tub surround ?Timed up and go (TUG): 20s ?10/13/21  TUG 16s ?                    6 minute walk test  564f ?         10/13/21 6MWT 853 ft ?GAIT: ?Distance walked: 550 ft ?Assistive device utilized: WEnvironmental consultant- 2 wheeled ?Level of assistance: CGA ?Comments: Forward hip flex, limited heel strike bilaterally R<L, decreased step length and cadence. ?  ?  ?  ?TODAY'S TREATMENT  ?10/08/21 ?Pt seen for aquatic therapy today.  Treatment took place in water 3.25-4.222fin [facility] Temp of water was 92?.  Pt entered/exited the pool via stairs step to pattern independently with bilat rail. ? ?Walking forward/ backward/ side stepping x 4 laps of each for warm up ? ?Seated straddled on yellow noodle ue supported by hand buoys  ?-cycling 20 quick revolutions; 20 slow x 5 cycles ?-Skiiing 20 quick; 20 slow x 3 cycles ? ?Standing ?Holding to wall: hip flex with abd x10 R/L ? ?   Seated on 4th step from bottom:  ? STS 2x5. Cues for weight shift and immediate standing balance. Successful completion with control 2/5 first trial; 3/5 2nd. ?flutter kicking (SLR) 3x25; add/abd 3x20.   ? ?Water Barre hurdle x 4 widths (abdct w/ ext rot) ? ? ?Pt requires buoyancy for support and to offload joints with strengthening exercises. Viscosity of the water is needed for resistance of strengthening; water current perturbations provides challenge to standing balance unsupported, requiring increased core activation. ? ? ? ? ? ?  ?PATIENT EDUCATION:  ?Education details: Properties of water, benefits of aquatic therapy ?Person educated: Patient and Caregiver ?Education method: Explanation ?Education comprehension: verbalized understanding ?  ?  ?HOME EXERCISE PROGRAM: ? stair climbing 2-3 x week with assistance ?Walk distance to pool with each visit. ?HEP: gastroc and hamstring stretching; flutter  and add/abd in seated 3x20, STS from 3rd step x5. ?Pt to sleep upstairs 1 x weekly using smaller bathroom, increase stair climbing to 4xweekly ? ?  ?ASSESSMENT: ?  ?CLINICAL IMPRESSION: ?Pt reports hip/groin is

## 2021-11-05 ENCOUNTER — Ambulatory Visit (HOSPITAL_BASED_OUTPATIENT_CLINIC_OR_DEPARTMENT_OTHER): Payer: Medicare Other | Admitting: Physical Therapy

## 2021-11-06 ENCOUNTER — Ambulatory Visit (HOSPITAL_BASED_OUTPATIENT_CLINIC_OR_DEPARTMENT_OTHER): Payer: Medicare Other | Admitting: Physical Therapy

## 2021-11-06 DIAGNOSIS — M6281 Muscle weakness (generalized): Secondary | ICD-10-CM

## 2021-11-06 DIAGNOSIS — R262 Difficulty in walking, not elsewhere classified: Secondary | ICD-10-CM

## 2021-11-06 DIAGNOSIS — G610 Guillain-Barre syndrome: Secondary | ICD-10-CM

## 2021-11-06 NOTE — Therapy (Signed)
?OUTPATIENT PHYSICAL THERAPY TREATMENT NOTE ? ?  ? ?Patient Name: Angela Morales ?MRN: [id number] ?DOB:1945-10-13, 76 y.o., female ?Today's Date: 11/06/2021 ? ?PCP: Deland Pretty, MD ?REFERRING PROVIDER: Deland Pretty, MD ? ? PT End of Session - 11/06/21 0957   ? ? Visit Number 17   ? Number of Visits 26   ? Date for PT Re-Evaluation 12/08/21   ? Authorization Time Period 10/13/21-12/08/21   ? PT Start Time [phone]   ? PT Stop Time 1030   ? PT Time Calculation (min) 42 min   ? Activity Tolerance Patient tolerated treatment well   ? Behavior During Therapy [facility] for tasks assessed/performed   ? ?  ?  ? ?  ? ? ? ? ? ? ? ?Past Medical History:  ?Diagnosis Date  ? Abnormal uterine bleeding   ? on HRT  ? Allergy   ? seasonal  ? Arthritis   ? Atrial fibrillation ([place])   ? Celiac disease   ? DDD (degenerative disc disease)   ? Dysrhythmia   ? GBS (Guillain-Barre syndrome) ([place])   ? Glaucoma   ? Microscopic colitis   ? Osteoarthritis of both knees   ? PONV (postoperative nausea and vomiting)   ? SVT (supraventricular tachycardia) ([place])   ? ?Past Surgical History:  ?Procedure Laterality Date  ? APPENDECTOMY    ? ATRIAL FIBRILLATION ABLATION N/A 01/28/2021  ? Procedure: ATRIAL FIBRILLATION ABLATION;  Surgeon: Constance Haw, MD;  Location: [place] CV LAB;  Service: Cardiovascular;  Laterality: N/A;  ? CARDIOVERSION N/A 10/30/2020  ? Procedure: CARDIOVERSION;  Surgeon: Jerline Pain, MD;  Location: [facility] ENDOSCOPY;  Service: Cardiovascular;  Laterality: N/A;  ? CARDIOVERSION N/A 03/16/2021  ? Procedure: CARDIOVERSION;  Surgeon: Thayer Headings, MD;  Location: [facility] ENDOSCOPY;  Service: Cardiovascular;  Laterality: N/A;  ? COLONOSCOPY    ? FOOT FRACTURE SURGERY  10/2013  ? TONSILLECTOMY AND ADENOIDECTOMY    ? TOTAL KNEE ARTHROPLASTY Left 03/22/2016  ? Procedure: TOTAL KNEE ARTHROPLASTY;  Surgeon: Dorna Leitz, MD;  Location: [place];  Service: Orthopedics;  Laterality: Left;  ? TUBAL LIGATION  1981  ? VAGINAL HYSTERECTOMY  04/20/05  ?  prolapse, adenomyosis  ? ?Patient Active Problem List  ? Diagnosis Date Noted  ? Abnormality of gait 08/14/2021  ? Left foot drop 08/14/2021  ? Wheelchair dependence 05/15/2021  ? Neurogenic bladder 04/09/2021  ? Atrial flutter with rapid ventricular response ([place]) 03/13/2021  ? Urinary retention   ? Chronic pain syndrome   ? GBS (Guillain-Barre syndrome) ([place]) 03/04/2021  ? Sleep disturbance   ? Atelectasis   ? Malnutrition of moderate degree 02/16/2021  ? Elevated white blood cell count   ? Status post tracheostomy ([place])   ? Oropharyngeal dysphagia   ? Acute respiratory failure with hypoxia ([place])   ? GBS (Guillain Barre syndrome) ([place]) 02/06/2021  ? Chronic anticoagulation 02/06/2021  ? Hyponatremia 02/06/2021  ? Prolonged QT interval 02/06/2021  ? Generalized weakness 02/06/2021  ? Secondary hypercoagulable state ([place]) 11/13/2020  ? Primary osteoarthritis of left knee 03/22/2016  ? Atrial fibrillation with RVR ([place]) 12/01/2013  ? Paroxysmal atrial fibrillation ([place]) 11/30/2013  ? Hypotension 11/30/2013  ? Atrial flutter ([place]) 11/30/2013  ? ? ?REFERRING DIAG: M48.062 (ICD-10-CM) - Spinal stenosis of lumbar region with neurogenic claudication ? ?THERAPY DIAG:  ?Muscle weakness (generalized) ? ?Difficulty in walking, not elsewhere classified ? ?GBS (Guillain-Barre syndrome) ([place]) ? ?PERTINENT HISTORY:  hx of Guillain Barre Syndrome/AIDP with B/L foot drop-  ?  also has aflutter- on Eliquis, spinal stenosis. ? ?PRECAUTIONS: resolving foot drop L>R ? ?SUBJECTIVE:  "I walked all the way down here with the cane today" ? ?PAIN:  ?Are you having pain? yes ?NPRS scale: 0/10 back ?Pain location: LEs ?Pain orientation: bilat thumbs 5/10 ?PAIN TYPE: aching ?Pain description:  ?Aggravating factors:  ?Relieving factors: rest ?  ?PRECAUTIONS: Other: resolving foot drop L>R ?  ?WEIGHT BEARING RESTRICTIONS No ?  ?FALLS:  ?Has patient fallen in last 6 months? No,  ?  ?LIVING ENVIRONMENT: ?Lives with: lives with their family ?Lives  in: House/apartment ?Stairs: Yes; External: 15 steps; on right going up ?Has following equipment at home: Single point cane, Walker - 2 wheeled, Wheelchair (manual), and Electronics engineer ?  ?OCCUPATION: retired ?  ?PLOF: Independent prior to GBS Aug 2022 ?  ?PATIENT GOALS walk indep, car for myself, return to PLOF ?  ?  ?OBJECTIVE:  ? * all objective findings taken at eval unless otherwise noted.  ?  ?  ?SCREENING FOR RED FLAGS: ?Bowel or bladder incontinence: No ?Spinal tumors: No ?Cauda equina syndrome: No ?Compression fracture: No ?Abdominal aneurysm: No ?  ?COGNITION: ?         Overall cognitive status: Within functional limits for tasks assessed              ?          ?SENSATION: ?         Light touch: Deficits Left distal foot decreased sensation.  Wears Left ankle inversion brace  ?          ?  ?MUSCLE LENGTH: ?Hamstrings: WFL ?  ?  ?POSTURE:  ?Forward hip flex, asymmetry: left hip elevation, right shoulder elevation ?  ?  ?  ?LE AROM/PROM: ?  ?A/PROM Right ?08/25/2021 Left ?08/25/2021 Left ?10/09/21  ?Hip flexion       ?Hip extension       ?Hip abduction       ?Hip adduction       ?Hip internal rotation       ?Hip external rotation       ?Knee flexion       ?Knee extension       ?Ankle dorsiflexion 5 -10 AROM: -8? in long sitting ?PROM: 1? in long sitting    ?Ankle plantarflexion full full   ?Ankle inversion       ?Ankle eversion       ? (Blank rows = not tested) ?  ?LE MMT: ?  ?MMT Right ?08/25/2021 Left ?08/25/2021 Right ?10/13/21 Left ?10/13/21  ?Hip flexion 3+ 3- 4 3+  ?Hip extension        ?Hip abduction 4 4 5- 4+  ?Hip adduction 4 4 5- 5-  ?Hip internal rotation        ?Hip external rotation        ?Knee flexion 4 4 5- 5-  ?Knee extension 4 4 5- 5-  ?Ankle dorsiflexion 4 3 5  3+  ?Ankle plantarflexion 4+ 3 5 3+  ?Ankle inversion        ?Ankle eversion      3-  ? (Blank rows = not tested) ?  ?Straight leg raise test: Negative and Slump test: Negative ?  ?FUNCTIONAL TESTS:  ?5 times sit to stand: 21 using ue from  transport chair ?10/09/21: 5x STS: 16.33 sec using UE, on hot tub surround ?Timed up and go (TUG): 20s ?10/13/21 TUG 16s ?  6 minute walk test  583f ?         10/13/21 6MWT 853 ft ?GAIT: ?Distance walked: 550 ft ?Assistive device utilized: WEnvironmental consultant- 2 wheeled ?Level of assistance: CGA ?Comments: Forward hip flex, limited heel strike bilaterally R<L, decreased step length and cadence. ?  ?  ?  ?TODAY'S TREATMENT  ?10/08/21 ?Pt seen for aquatic therapy today.  Treatment took place in water 3.25-4.227fin [facility] Temp of water was 92?.  Pt entered/exited the pool via stairs step to pattern independently with bilat rail. ? ?Walking forward/ backward/ side stepping x 4 laps of each for warm up ? ?Seated straddled on yellow noodle ue supported by hand buoys  ?-cycling 20 quick revolutions; 20 slow x 5 cycles ?-Skiiing 20 quick; 20 slow x 3 cycles ?-jumping add/abd 3x10 ? ?Standing ?Holding to wall: hip flex with abd x10 R/L ?-add/abd, marching; hip extension x12.  Cues for stabilized core. ?Kick board push down for core engagement 3x10 ? ?   Seated on 4th step from bottom:  ?flutter kicking (SLR) 3x25; add/abd 3x20.   ? ?Water Barre hurdle x 2 widths (abdct w/ ext rot) ? ?Walking between exercises for stretching and recovery. ? ?Pt requires buoyancy for support and to offload joints with strengthening exercises. Viscosity of the water is needed for resistance of strengthening; water current perturbations provides challenge to standing balance unsupported, requiring increased core activation. ? ? ? ? ? ?  ?PATIENT EDUCATION:  ?Education details: Properties of water, benefits of aquatic therapy ?Person educated: Patient and Caregiver ?Education method: Explanation ?Education comprehension: verbalized understanding ?  ?  ?HOME EXERCISE PROGRAM: ? stair climbing 2-3 x week with assistance ?Walk distance to pool with each visit. ?HEP: gastroc and hamstring stretching; flutter and  add/abd in seated 3x20, STS from 3rd step x5. ?Pt to sleep upstairs 1 x weekly using smaller bathroom, increase stair climbing to 4xweekly ? ?  ?ASSESSMENT: ?  ?CLINICAL IMPRESSION: ?Pt walked distance from

## 2021-11-11 ENCOUNTER — Encounter (HOSPITAL_BASED_OUTPATIENT_CLINIC_OR_DEPARTMENT_OTHER): Payer: Self-pay | Admitting: Physical Therapy

## 2021-11-11 ENCOUNTER — Ambulatory Visit (HOSPITAL_BASED_OUTPATIENT_CLINIC_OR_DEPARTMENT_OTHER): Payer: Medicare Other | Admitting: Physical Therapy

## 2021-11-11 DIAGNOSIS — G610 Guillain-Barre syndrome: Secondary | ICD-10-CM

## 2021-11-11 DIAGNOSIS — M6281 Muscle weakness (generalized): Secondary | ICD-10-CM | POA: Diagnosis not present

## 2021-11-11 DIAGNOSIS — R262 Difficulty in walking, not elsewhere classified: Secondary | ICD-10-CM

## 2021-11-11 NOTE — Therapy (Signed)
?OUTPATIENT PHYSICAL THERAPY TREATMENT NOTE ? ?  ? ?Patient Name: Angela Morales ?MRN: [id number] ?DOB:Aug 22, 1945, 76 y.o., female ?Today's Date: 11/11/2021 ? ?PCP: Deland Pretty, MD ?REFERRING PROVIDER: Deland Pretty, MD ? ? PT End of Session - 11/11/21 1153   ? ? Visit Number 18   ? Number of Visits 26   ? Date for PT Re-Evaluation 12/08/21   ? Authorization Time Period 10/13/21-12/08/21   ? PT Start Time 1146   ? PT Stop Time 1225   ? PT Time Calculation (min) 39 min   ? Activity Tolerance Patient tolerated treatment well   ? Behavior During Therapy [facility] for tasks assessed/performed   ? ?  ?  ? ?  ? ? ? ? ? ? ? ?Past Medical History:  ?Diagnosis Date  ? Abnormal uterine bleeding   ? on HRT  ? Allergy   ? seasonal  ? Arthritis   ? Atrial fibrillation ([place])   ? Celiac disease   ? DDD (degenerative disc disease)   ? Dysrhythmia   ? GBS (Guillain-Barre syndrome) ([place])   ? Glaucoma   ? Microscopic colitis   ? Osteoarthritis of both knees   ? PONV (postoperative nausea and vomiting)   ? SVT (supraventricular tachycardia) ([place])   ? ?Past Surgical History:  ?Procedure Laterality Date  ? APPENDECTOMY    ? ATRIAL FIBRILLATION ABLATION N/A 01/28/2021  ? Procedure: ATRIAL FIBRILLATION ABLATION;  Surgeon: Constance Haw, MD;  Location: [place] [facility];  Service: Cardiovascular;  Laterality: N/A;  ? CARDIOVERSION N/A 10/30/2020  ? Procedure: CARDIOVERSION;  Surgeon: Jerline Pain, MD;  Location: [facility] ENDOSCOPY;  Service: Cardiovascular;  Laterality: N/A;  ? CARDIOVERSION N/A 03/16/2021  ? Procedure: CARDIOVERSION;  Surgeon: Thayer Headings, MD;  Location: [facility] ENDOSCOPY;  Service: Cardiovascular;  Laterality: N/A;  ? COLONOSCOPY    ? FOOT FRACTURE SURGERY  10/2013  ? TONSILLECTOMY AND ADENOIDECTOMY    ? TOTAL KNEE ARTHROPLASTY Left 03/22/2016  ? Procedure: TOTAL KNEE ARTHROPLASTY;  Surgeon: Dorna Leitz, MD;  Location: [place];  Service: Orthopedics;  Laterality: Left;  ? TUBAL LIGATION  1981  ? VAGINAL HYSTERECTOMY  04/20/05  ?  prolapse, adenomyosis  ? ?Patient Active Problem List  ? Diagnosis Date Noted  ? Abnormality of gait 08/14/2021  ? Left foot drop 08/14/2021  ? Wheelchair dependence 05/15/2021  ? Neurogenic bladder 04/09/2021  ? Atrial flutter with rapid ventricular response ([place]) 03/13/2021  ? Urinary retention   ? Chronic pain syndrome   ? GBS (Guillain-Barre syndrome) ([place]) 03/04/2021  ? Sleep disturbance   ? Atelectasis   ? Malnutrition of moderate degree 02/16/2021  ? Elevated white blood cell count   ? Status post tracheostomy ([place])   ? Oropharyngeal dysphagia   ? Acute respiratory failure with hypoxia ([place])   ? GBS (Guillain Barre syndrome) ([place]) 02/06/2021  ? Chronic anticoagulation 02/06/2021  ? Hyponatremia 02/06/2021  ? Prolonged QT interval 02/06/2021  ? Generalized weakness 02/06/2021  ? Secondary hypercoagulable state ([place]) 11/13/2020  ? Primary osteoarthritis of left knee 03/22/2016  ? Atrial fibrillation with RVR ([place]) 12/01/2013  ? Paroxysmal atrial fibrillation ([place]) 11/30/2013  ? Hypotension 11/30/2013  ? Atrial flutter ([place]) 11/30/2013  ? ? ?REFERRING DIAG: M48.062 (ICD-10-CM) - Spinal stenosis of lumbar region with neurogenic claudication ? ?THERAPY DIAG:  ?Muscle weakness (generalized) ? ?Difficulty in walking, not elsewhere classified ? ?GBS (Guillain-Barre syndrome) ([place]) ? ?PERTINENT HISTORY:  hx of Guillain Barre Syndrome/AIDP with B/L foot drop-  ?  also has aflutter- on Eliquis, spinal stenosis. ? ?PRECAUTIONS: resolving foot drop L>R ? ?SUBJECTIVE: Pt arrives with rollator today, instead of cane.  Her caretaker will be leaving during session, and it is easier to navigate back to car with rollator.  She has been walking around with cane during day.   Her hands are tired from gardening.  ? ?PAIN:  ?Are you having pain? yes ?NPRS scale: 4/10 ?Pain location: hands  ?Pain orientation: bilat  ?PAIN TYPE: aching ? ?Aggravating factors:  ?Relieving factors: rest ?  ?PRECAUTIONS: Other: resolving foot drop L>R ?   ?WEIGHT BEARING RESTRICTIONS No ?  ?FALLS:  ?Has patient fallen in last 6 months? No,  ?  ?LIVING ENVIRONMENT: ?Lives with: lives with their family ?Lives in: House/apartment ?Stairs: Yes; External: 15 steps; on right going up ?Has following equipment at home: Single point cane, Walker - 2 wheeled, Wheelchair (manual), and Electronics engineer ?  ?OCCUPATION: retired ?  ?PLOF: Independent prior to GBS Aug 2022 ?  ?PATIENT GOALS walk indep, car for myself, return to PLOF ?  ?  ?OBJECTIVE:  ? * all objective findings taken at eval unless otherwise noted.  ?  ?  ?SCREENING FOR RED FLAGS: ?Bowel or bladder incontinence: No ?Spinal tumors: No ?Cauda equina syndrome: No ?Compression fracture: No ?Abdominal aneurysm: No ?  ?COGNITION: ?         Overall cognitive status: Within functional limits for tasks assessed              ?          ?SENSATION: ?         Light touch: Deficits Left distal foot decreased sensation.  Wears Left ankle inversion brace  ?          ?  ?MUSCLE LENGTH: ?Hamstrings: WFL ?  ?  ?POSTURE:  ?Forward hip flex, asymmetry: left hip elevation, right shoulder elevation ?  ?  ?  ?LE AROM/PROM: ?  ?A/PROM Right ?08/25/2021 Left ?08/25/2021 Left ?10/09/21  ?Hip flexion       ?Hip extension       ?Hip abduction       ?Hip adduction       ?Hip internal rotation       ?Hip external rotation       ?Knee flexion       ?Knee extension       ?Ankle dorsiflexion 5 -10 AROM: -8? in long sitting ?PROM: 1? in long sitting    ?Ankle plantarflexion full full   ?Ankle inversion       ?Ankle eversion       ? (Blank rows = not tested) ?  ?LE MMT: ?  ?MMT Right ?08/25/2021 Left ?08/25/2021 Right ?10/13/21 Left ?10/13/21  ?Hip flexion 3+ 3- 4 3+  ?Hip extension        ?Hip abduction 4 4 5- 4+  ?Hip adduction 4 4 5- 5-  ?Hip internal rotation        ?Hip external rotation        ?Knee flexion 4 4 5- 5-  ?Knee extension 4 4 5- 5-  ?Ankle dorsiflexion 4 3 5  3+  ?Ankle plantarflexion 4+ 3 5 3+  ?Ankle inversion        ?Ankle eversion      3-   ? (Blank rows = not tested) ?  ?Straight leg raise test: Negative and Slump test: Negative ?  ?FUNCTIONAL TESTS:  ?5 times sit to stand: 21 using ue from  transport chair ?10/09/21: 5x STS: 16.33 sec using UE, on hot tub surround ?Timed up and go (TUG): 20s ?10/13/21 TUG 16s ?                    6 minute walk test  533f ?         10/13/21 6MWT 853 ft ?GAIT: ?Distance walked: 550 ft ?Assistive device utilized: WEnvironmental consultant- 2 wheeled ?Level of assistance: CGA ?Comments: Forward hip flex, limited heel strike bilaterally R<L, decreased step length and cadence. ?  ?  ?  ?TODAY'S TREATMENT  ?Pt seen for aquatic therapy today.  Treatment took place in water 3.25-4.248fin depth at [facility]. Temp of water was 92?.  Pt entered/exited the pool via stairs step to pattern independently with bilat rail. ? ?Walking forward/ backward/ side stepping x 4 laps of each for warm up ?Seated on 4th step from bottom:  ?flutter kicking (SLR) 3x25; add/abd 3x20.   ?STS on 3rd step, with cues for avoiding valgus of knees when descending x 10, eccentric lowering ? ?Seated straddled on yellow noodle ue supported by yellow hand buoys  ?-cycling 20 quick revolutions; 20 slow x 5 cycles ?-Skiiing 20 quick; 20 slow x 3 cycles ?-jumping add/abd 3x10 ? ?SLS without support- L/R with cues to keep knee soft  ?R/L calf stretch (runners stretch) x 15s x 2 each LE ?3 way leg kicks (front, side, back) x 2 reps prior to switching legs, x 12 reps total each leg- holding yellow hand buoys ?Forward walking hurdles holding yellow noodle ?Wide stepping backwards walking (LEs fatigue after 3rd lap) ? ?Pt requires buoyancy for support and to offload joints with strengthening exercises. Viscosity of the water is needed for resistance of strengthening; water current perturbations provides challenge to standing balance unsupported, requiring increased core activation. ? ?  ?PATIENT EDUCATION:  ?Education details: Progression of exercise ?Person  educated: Patient and Caregiver ?Education method: Explanation ?Education comprehension: verbalized understanding ?  ?  ?HOME EXERCISE PROGRAM: ? stair climbing 2-3 x week with assistance ?Walk distance to poo

## 2021-11-13 ENCOUNTER — Ambulatory Visit (HOSPITAL_BASED_OUTPATIENT_CLINIC_OR_DEPARTMENT_OTHER): Payer: Medicare Other | Admitting: Physical Therapy

## 2021-11-13 ENCOUNTER — Encounter (HOSPITAL_BASED_OUTPATIENT_CLINIC_OR_DEPARTMENT_OTHER): Payer: Self-pay | Admitting: Physical Therapy

## 2021-11-13 DIAGNOSIS — M6281 Muscle weakness (generalized): Secondary | ICD-10-CM | POA: Diagnosis not present

## 2021-11-13 DIAGNOSIS — R262 Difficulty in walking, not elsewhere classified: Secondary | ICD-10-CM

## 2021-11-13 DIAGNOSIS — G610 Guillain-Barre syndrome: Secondary | ICD-10-CM

## 2021-11-13 NOTE — Therapy (Signed)
OUTPATIENT PHYSICAL THERAPY TREATMENT NOTE     Patient Name: Angela Morales MRN: [id number] DOB:05/27/1946, 76 y.o., female Today's Date: 11/13/2021  PCP: Deland Pretty, MD REFERRING PROVIDER: Deland Pretty, MD   PT End of Session - 11/13/21 1200     Visit Number 18    Number of Visits 26    Date for PT Re-Evaluation 12/08/21    Authorization Time Period 10/13/21-12/08/21    PT Start Time 0946    PT Stop Time 1030    PT Time Calculation (min) 44 min    Activity Tolerance Patient tolerated treatment well    Behavior During Therapy WFL for tasks assessed/performed                  Past Medical History:  Diagnosis Date   Abnormal uterine bleeding    on HRT   Allergy    seasonal   Arthritis    Atrial fibrillation (HCC)    Celiac disease    DDD (degenerative disc disease)    Dysrhythmia    GBS (Guillain-Barre syndrome) (HCC)    Glaucoma    Microscopic colitis    Osteoarthritis of both knees    PONV (postoperative nausea and vomiting)    SVT (supraventricular tachycardia) ([place])    Past Surgical History:  Procedure Laterality Date   APPENDECTOMY     ATRIAL FIBRILLATION ABLATION N/A 01/28/2021   Procedure: ATRIAL FIBRILLATION ABLATION;  Surgeon: Constance Haw, MD;  Location: [place] [facility];  Service: Cardiovascular;  Laterality: N/A;   CARDIOVERSION N/A 10/30/2020   Procedure: CARDIOVERSION;  Surgeon: Jerline Pain, MD;  Location: [facility] ENDOSCOPY;  Service: Cardiovascular;  Laterality: N/A;   CARDIOVERSION N/A 03/16/2021   Procedure: CARDIOVERSION;  Surgeon: Thayer Headings, MD;  Location: [facility] ENDOSCOPY;  Service: Cardiovascular;  Laterality: N/A;   COLONOSCOPY     FOOT FRACTURE SURGERY  10/2013   TONSILLECTOMY AND ADENOIDECTOMY     TOTAL KNEE ARTHROPLASTY Left 03/22/2016   Procedure: TOTAL KNEE ARTHROPLASTY;  Surgeon: Dorna Leitz, MD;  Location: [place];  Service: Orthopedics;  Laterality: Left;   TUBAL LIGATION  1981   VAGINAL HYSTERECTOMY  04/20/05    prolapse, adenomyosis   Patient Active Problem List   Diagnosis Date Noted   Abnormality of gait 08/14/2021   Left foot drop 08/14/2021   Wheelchair dependence 05/15/2021   Neurogenic bladder 04/09/2021   Atrial flutter with rapid ventricular response ([place]) 03/13/2021   Urinary retention    Chronic pain syndrome    GBS (Guillain-Barre syndrome) ([place]) 03/04/2021   Sleep disturbance    Atelectasis    Malnutrition of moderate degree 02/16/2021   Elevated white blood cell count    Status post tracheostomy ([place])    Oropharyngeal dysphagia    Acute respiratory failure with hypoxia (HCC)    GBS (Guillain Barre syndrome) ([place]) 02/06/2021   Chronic anticoagulation 02/06/2021   Hyponatremia 02/06/2021   Prolonged QT interval 02/06/2021   Generalized weakness 02/06/2021   Secondary hypercoagulable state ([place]) 11/13/2020   Primary osteoarthritis of left knee 03/22/2016   Atrial fibrillation with RVR ([place]) 12/01/2013   Paroxysmal atrial fibrillation ([place]) 11/30/2013   Hypotension 11/30/2013   Atrial flutter ([place]) 11/30/2013    REFERRING DIAG: V37.106 (ICD-10-CM) - Spinal stenosis of lumbar region with neurogenic claudication  THERAPY DIAG:  Muscle weakness (generalized)  Difficulty in walking, not elsewhere classified  GBS (Guillain-Barre syndrome) ([place])  PERTINENT HISTORY:  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  PRECAUTIONS: resolving foot drop L>R  SUBJECTIVE: "Back is tired from walk down here but otherwise I am good."  PAIN:  Are you having pain? yes NPRS scale: 2/10 Pain location: hands  Pain orientation: bilat  PAIN TYPE: aching  Aggravating factors:  Relieving factors: rest   PRECAUTIONS: Other: resolving foot drop L>R   WEIGHT BEARING RESTRICTIONS No   FALLS:  Has patient fallen in last 6 months? No,    LIVING ENVIRONMENT: Lives with: lives with their family Lives in:  House/apartment Stairs: Yes; External: 15 steps; on right going up Has following equipment at home: Single point cane, Walker - 2 wheeled, Wheelchair (manual), and Electronics engineer   OCCUPATION: retired   PLOF: Independent prior to GBS Aug 2022   PATIENT GOALS walk indep, car for myself, return to PLOF     OBJECTIVE:   * all objective findings taken at eval unless otherwise noted.      SCREENING FOR RED FLAGS: Bowel or bladder incontinence: No Spinal tumors: No Cauda equina syndrome: No Compression fracture: No Abdominal aneurysm: No   COGNITION:          Overall cognitive status: Within functional limits for tasks assessed                        SENSATION:          Light touch: Deficits Left distal foot decreased sensation.  Wears Left ankle inversion brace              MUSCLE LENGTH: Hamstrings: WFL     POSTURE:  Forward hip flex, asymmetry: left hip elevation, right shoulder elevation       LE AROM/PROM:   A/PROM Right 08/25/2021 Left 08/25/2021 Left 10/09/21  Hip flexion       Hip extension       Hip abduction       Hip adduction       Hip internal rotation       Hip external rotation       Knee flexion       Knee extension       Ankle dorsiflexion 5 -10 AROM: -8 in long sitting PROM: 1 in long sitting    Ankle plantarflexion full full   Ankle inversion       Ankle eversion        (Blank rows = not tested)   LE MMT:   MMT Right 08/25/2021 Left 08/25/2021 Right 10/13/21 Left 10/13/21  Hip flexion 3+ 3- 4 3+  Hip extension        Hip abduction 4 4 5- 4+  Hip adduction 4 4 5- 5-  Hip internal rotation        Hip external rotation        Knee flexion 4 4 5- 5-  Knee extension 4 4 5- 5-  Ankle dorsiflexion 4 3 5  3+  Ankle plantarflexion 4+ 3 5 3+  Ankle inversion        Ankle eversion      3-   (Blank rows = not tested)   Straight leg raise test: Negative and Slump test: Negative   FUNCTIONAL TESTS:  5 times sit to stand: 21 using ue from  transport chair 10/09/21: 5x STS: 16.33 sec using UE, on hot tub surround Timed up and go (TUG): 20s 10/13/21 TUG 16s  6 minute walk test  543f          10/13/21 6MWT 853 ft GAIT: Distance walked: 550 ft Assistive device utilized: Walker - 2 wheeled Level of assistance: CGA Comments: Forward hip flex, limited heel strike bilaterally R<L, decreased step length and cadence.       TODAY'S TREATMENT  Pt seen for aquatic therapy today.  Treatment took place in water 3.25-4.259fin [facility] Temp of water was 92.  Pt entered/exited the pool via stairs step to pattern independently with bilat rail.  Walking forward/ backward/ side stepping x 4 laps of each for warm up Seated on bench: flutter kicking with arms elevated 5 x 30s for core engagement and aerobic capacity   Seated straddled on yellow noodle ue supported by yellow hand buoys  -cycling x4 width -add/abd and scissoring  Seated bench -sitting on edge of bench-arms out of water, shoulders at 90d flex-cues for abdominal bracing-flutter kicking 5x 25 reps (Core) Standing -Noodle push down 2 x10 core engagement [id number] step holding to blue noodle x10 R/L  Pt requires buoyancy for support and to offload joints with strengthening exercises. Viscosity of the water is needed for resistance of strengthening; water current perturbations provides challenge to standing balance unsupported, requiring increased core activation.    PATIENT EDUCATION:  Education details: Progression of exercise Person educated: Patient and Caregiver Education method: Explanation Education comprehension: verbalized understanding     HOME EXERCISE PROGRAM:  stair climbing 2-3 x week with assistance Walk distance to pool with each visit. HEP: gastroc and hamstring stretching; flutter and add/abd in seated 3x20, STS from 3rd step x5. Pt to sleep upstairs 1 x weekly using smaller bathroom, increase stair  climbing to 4xweekly    ASSESSMENT:   CLINICAL IMPRESSION: Pt reports no back pain just fatigue, hands are OK. Increased core and LE strengthening with flutter kicking seated on edge of bench. Pt with some difficulty maintaining position due to RLE with stronger kick than left.  Pt will add to her HEP (As she completes on sat). Decreased intensity today due to back fatigue.  She is tolerating increased time with use of cane using in home most of time as well as amb to aquatics with each visit. She used rollator leaving due to fatigue. Will continue advancing toleration to activity to progress pt to indep with fxn mobility and ADL's    OBJECTIVE IMPAIRMENTS Abnormal gait, decreased activity tolerance, decreased balance, decreased endurance, decreased mobility, difficulty walking, decreased ROM, decreased strength, impaired sensation, and pain.    ACTIVITY LIMITATIONS cleaning, community activity, driving, meal prep, laundry, and shopping.    PERSONAL FACTORS Age and 3+ comorbidities:    are also affecting patient's functional outcome.      REHAB POTENTIAL: Good   CLINICAL DECISION MAKING: Evolving/moderate complexity   EVALUATION COMPLEXITY: Moderate     GOALS: Goals reviewed with patient? Yes   SHORT TERM GOALS:   STG Name Target Date Goal status  1 Pt will tolerate full sessions of aquatic therapy without reports of excessive fatigue or increase pain Baseline:  09/15/2021   Achieved 09/23/21  2 Pt will be indep and compliant with initial aquatic HEP Baseline:  09/15/2021  Achieved 09/22/21  3 Pt will tolerate amb one way (to or from) pool Baseline: 09/22/2021  Achieved 09/23/21  4 Pt will be able to walk 10 continuous minutes in pool (without rest period) Baseline: 09/22/2021  Achieved 10/01/21  LONG TERM GOALS:  LTG Name Target Date Goal status  1 Pt will be able to climb 15 steps to access her second floor of home Baseline: 10/20/2021 Achieved 10/01/21  2 Pt will be able  to tolerate walking to and from pool for therapy Baseline: 10/20/2021 Achieved 10/09/21  3 Pt will improve on the 6 minute walk test ambulating > 800 ft with AD as needed Baseline: 10/20/2021 Achieved 10/13/21  4 Pt will improve on the 5 x STS test with completion in < 15s Baseline: 16.33 seconds 10/09/21 10/20/2021 Ongoing  5 Pt will improve on the TUG test form 20s to <or= 17s Baseline: 10/20/2021 Achieved 10/13/21       6 Pt will climb 1 flight of steps indep and 7x weekly to demonstrate adequate strength to return sleeping in master bed Baseline: 3-4 x weekly 12/08/21 New  7 Pt will amb > 1000 ft continuously gaining community ambulation ability. Baseline: 853 12/08/21 New  8 Pt will report return of 90 to 95% of PLOF Baseline: 80-85% 12/08/21 New  9 Pt will ambulate with a cane safely and indep. Baseline: rollator 12/08/21 New    PLAN: PT FREQUENCY: 1-2x/week   PT DURATION: 8 weeks   PLANNED INTERVENTIONS: Therapeutic exercises, Therapeutic activity, Neuro Muscular re-education, Balance training, Gait training, Patient/Family education, Joint mobilization, Stair training, Aquatic Therapy, Taping, and Manual therapy   PLAN FOR NEXT SESSION:balance, stretching, core strengthening, stair climbing, gait and endurance training.    Stanton Kidney Tharon Aquas) Flem Enderle MPT 11/13/21 12:03 PM

## 2021-11-17 ENCOUNTER — Ambulatory Visit (HOSPITAL_BASED_OUTPATIENT_CLINIC_OR_DEPARTMENT_OTHER): Payer: Medicare Other | Admitting: Physical Therapy

## 2021-11-17 ENCOUNTER — Encounter (HOSPITAL_BASED_OUTPATIENT_CLINIC_OR_DEPARTMENT_OTHER): Payer: Self-pay | Admitting: Physical Therapy

## 2021-11-17 DIAGNOSIS — M6281 Muscle weakness (generalized): Secondary | ICD-10-CM

## 2021-11-17 DIAGNOSIS — R262 Difficulty in walking, not elsewhere classified: Secondary | ICD-10-CM

## 2021-11-17 DIAGNOSIS — G610 Guillain-Barre syndrome: Secondary | ICD-10-CM

## 2021-11-17 NOTE — Therapy (Signed)
OUTPATIENT PHYSICAL THERAPY TREATMENT NOTE     Patient Name: Angela Morales MRN: [id number] DOB:[birth year], 76 y.o., female Today's Date: 11/17/2021  PCP: Deland Pretty, MD REFERRING PROVIDER: Deland Pretty, MD   PT End of Session - 11/17/21 [phone]     Visit Number 19    Number of Visits 26    Date for PT Re-Evaluation 12/08/21    Authorization Time Period 10/13/21-12/08/21    PT Start Time 0900    PT Stop Time 0945    PT Time Calculation (min) 45 min    Activity Tolerance Patient tolerated treatment well    Behavior During Therapy [facility] for tasks assessed/performed                  Past Medical History:  Diagnosis Date   Abnormal uterine bleeding    on HRT   Allergy    seasonal   Arthritis    Atrial fibrillation (HCC)    Celiac disease    DDD (degenerative disc disease)    Dysrhythmia    GBS (Guillain-Barre syndrome) (HCC)    Glaucoma    Microscopic colitis    Osteoarthritis of both knees    PONV (postoperative nausea and vomiting)    SVT (supraventricular tachycardia) ([place])    Past Surgical History:  Procedure Laterality Date   APPENDECTOMY     ATRIAL FIBRILLATION ABLATION N/A 01/28/2021   Procedure: ATRIAL FIBRILLATION ABLATION;  Surgeon: Constance Haw, MD;  Location: [place] [facility];  Service: Cardiovascular;  Laterality: N/A;   CARDIOVERSION N/A 10/30/2020   Procedure: CARDIOVERSION;  Surgeon: Jerline Pain, MD;  Location: [facility] ENDOSCOPY;  Service: Cardiovascular;  Laterality: N/A;   CARDIOVERSION N/A 03/16/2021   Procedure: CARDIOVERSION;  Surgeon: Thayer Headings, MD;  Location: [facility] ENDOSCOPY;  Service: Cardiovascular;  Laterality: N/A;   COLONOSCOPY     FOOT FRACTURE SURGERY  10/2013   TONSILLECTOMY AND ADENOIDECTOMY     TOTAL KNEE ARTHROPLASTY Left 03/22/2016   Procedure: TOTAL KNEE ARTHROPLASTY;  Surgeon: Dorna Leitz, MD;  Location: [place];  Service: Orthopedics;  Laterality: Left;   TUBAL LIGATION  1981   VAGINAL HYSTERECTOMY  04/20/05    prolapse, adenomyosis   Patient Active Problem List   Diagnosis Date Noted   Abnormality of gait 08/14/2021   Left foot drop 08/14/2021   Wheelchair dependence 05/15/2021   Neurogenic bladder 04/09/2021   Atrial flutter with rapid ventricular response ([place]) 03/13/2021   Urinary retention    Chronic pain syndrome    GBS (Guillain-Barre syndrome) ([place]) 03/04/2021   Sleep disturbance    Atelectasis    Malnutrition of moderate degree 02/16/2021   Elevated white blood cell count    Status post tracheostomy ([place])    Oropharyngeal dysphagia    Acute respiratory failure with hypoxia (HCC)    GBS (Guillain Barre syndrome) ([place]) 02/06/2021   Chronic anticoagulation 02/06/2021   Hyponatremia 02/06/2021   Prolonged QT interval 02/06/2021   Generalized weakness 02/06/2021   Secondary hypercoagulable state ([place]) 11/13/2020   Primary osteoarthritis of left knee 03/22/2016   Atrial fibrillation with RVR ([place]) 12/01/2013   Paroxysmal atrial fibrillation ([place]) 11/30/2013   Hypotension 11/30/2013   Atrial flutter ([place]) 11/30/2013    REFERRING DIAG: R44.315 (ICD-10-CM) - Spinal stenosis of lumbar region with neurogenic claudication  THERAPY DIAG:  Muscle weakness (generalized)  Difficulty in walking, not elsewhere classified  GBS (Guillain-Barre syndrome) ([place])  PERTINENT HISTORY:  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  PRECAUTIONS: resolving foot drop L>R  SUBJECTIVE: "Walked at park over the weekend"  PAIN:  Are you having pain? yes NPRS scale: 2/10 Pain location: hands  Pain orientation: bilat  PAIN TYPE: aching  Aggravating factors:  Relieving factors: rest   PRECAUTIONS: Other: resolving foot drop L>R   WEIGHT BEARING RESTRICTIONS No   FALLS:  Has patient fallen in last 6 months? No,    LIVING ENVIRONMENT: Lives with: lives with their family Lives in: House/apartment Stairs: Yes; External: 15  steps; on right going up Has following equipment at home: Single point cane, Walker - 2 wheeled, Wheelchair (manual), and Electronics engineer   OCCUPATION: retired   PLOF: Independent prior to GBS Aug 2022   PATIENT GOALS walk indep, car for myself, return to PLOF     OBJECTIVE:   * all objective findings taken at eval unless otherwise noted.      SCREENING FOR RED FLAGS: Bowel or bladder incontinence: No Spinal tumors: No Cauda equina syndrome: No Compression fracture: No Abdominal aneurysm: No   COGNITION:          Overall cognitive status: Within functional limits for tasks assessed                        SENSATION:          Light touch: Deficits Left distal foot decreased sensation.  Wears Left ankle inversion brace              MUSCLE LENGTH: Hamstrings: WFL     POSTURE:  Forward hip flex, asymmetry: left hip elevation, right shoulder elevation       LE AROM/PROM:   A/PROM Right 08/25/2021 Left 08/25/2021 Left 10/09/21  Hip flexion       Hip extension       Hip abduction       Hip adduction       Hip internal rotation       Hip external rotation       Knee flexion       Knee extension       Ankle dorsiflexion 5 -10 AROM: -8 in long sitting PROM: 1 in long sitting    Ankle plantarflexion full full   Ankle inversion       Ankle eversion        (Blank rows = not tested)   LE MMT:   MMT Right 08/25/2021 Left 08/25/2021 Right 10/13/21 Left 10/13/21  Hip flexion 3+ 3- 4 3+  Hip extension        Hip abduction 4 4 5- 4+  Hip adduction 4 4 5- 5-  Hip internal rotation        Hip external rotation        Knee flexion 4 4 5- 5-  Knee extension 4 4 5- 5-  Ankle dorsiflexion 4 3 5  3+  Ankle plantarflexion 4+ 3 5 3+  Ankle inversion        Ankle eversion      3-   (Blank rows = not tested)   Straight leg raise test: Negative and Slump test: Negative   FUNCTIONAL TESTS:  5 times sit to stand: 21 using ue from transport chair 10/09/21: 5x STS: 16.33 sec  using UE, on hot tub surround Timed up and go (TUG): 20s 10/13/21 TUG 16s  6 minute walk test  532f          10/13/21 6MWT 853 ft GAIT: Distance walked: 550 ft Assistive device utilized: Walker - 2 wheeled Level of assistance: CGA Comments: Forward hip flex, limited heel strike bilaterally R<L, decreased step length and cadence.       TODAY'S TREATMENT  Pt seen for aquatic therapy today.  Treatment took place in water 3.25-4.260fin depth at the [facility]. Temp of water was 92.  Pt entered/exited the pool via stairs step to pattern independently with bilat rail.  Walking forward/ backward/ side stepping x 4 laps of each for warm up  Seated on 4 (bottom) step: flutter kicking 3x25 reps; add/abd 3x25 for strengthening and aerobic capacity -STS from 4th step 2x5. Cues for core engagement and control.   Seated straddled on yellow noodle ue supported by yellow hand buoys  -cycling x8 widths Forward and back, alternating 20 quick to 20 slow revolutions -add/abd and scissoring 3 sets of 20   Standing -Noodle push down 2 x10 core engagement [id number] step holding to blue noodle x10 R/L  -Barre forward hurdles 2 widths ue supported with yellow hand buoys  -"stork stance" Hip IR/ER x10 R/L UE yellow hand buoys -hip flex/external rotation and abd x10 R/L  Pt requires buoyancy for support and to offload joints with strengthening exercises. Viscosity of the water is needed for resistance of strengthening; water current perturbations provides challenge to standing balance unsupported, requiring increased core activation.    PATIENT EDUCATION:  Education details: Progression of exercise Person educated: Patient and Caregiver Education method: Explanation Education comprehension: verbalized understanding     HOME EXERCISE PROGRAM:  stair climbing 2-3 x week with assistance Walk distance to pool with each visit. HEP: gastroc and hamstring stretching;  flutter and add/abd in seated 3x20, STS from 3rd step x5. Pt to sleep upstairs 1 x weekly using smaller bathroom, increase stair climbing to 4xweekly    ASSESSMENT:   CLINICAL IMPRESSION: Pt reports walking through park over w/e for ~ 1 hour with 2 seated rest periods. Walk was leisurely using rollator to better negotiate unlevel surfaces. She reports excellent toleration. Amb to and from pool session using cane today and reports use of cane in home majority of time, rollator use in am upon OOB for safety due to stiffness. She continues to progress with aerobic capacity as evidenced by toleration of increased aerobic activity today and toleration to activity with increased indep and decrease AD use as noted above.       OBJECTIVE IMPAIRMENTS Abnormal gait, decreased activity tolerance, decreased balance, decreased endurance, decreased mobility, difficulty walking, decreased ROM, decreased strength, impaired sensation, and pain.    ACTIVITY LIMITATIONS cleaning, community activity, driving, meal prep, laundry, and shopping.    PERSONAL FACTORS Age and 3+ comorbidities:    are also affecting patient's functional outcome.      REHAB POTENTIAL: Good   CLINICAL DECISION MAKING: Evolving/moderate complexity   EVALUATION COMPLEXITY: Moderate     GOALS: Goals reviewed with patient? Yes   SHORT TERM GOALS:   STG Name Target Date Goal status  1 Pt will tolerate full sessions of aquatic therapy without reports of excessive fatigue or increase pain Baseline:  09/15/2021   Achieved 09/23/21  2 Pt will be indep and compliant with initial aquatic HEP Baseline:  09/15/2021  Achieved 09/22/21  3 Pt will tolerate amb one way (to or from) pool Baseline: 09/22/2021  Achieved 09/23/21  4  Pt will be able to walk 10 continuous minutes in pool (without rest period) Baseline: 09/22/2021  Achieved 10/01/21  LONG TERM GOALS:    LTG Name Target Date Goal status  1 Pt will be able to climb 15 steps to  access her second floor of home Baseline: 10/20/2021 Achieved 10/01/21  2 Pt will be able to tolerate walking to and from pool for therapy Baseline: 10/20/2021 Achieved 10/09/21  3 Pt will improve on the 6 minute walk test ambulating > 800 ft with AD as needed Baseline: 10/20/2021 Achieved 10/13/21  4 Pt will improve on the 5 x STS test with completion in < 15s Baseline: 16.33 seconds 10/09/21 10/20/2021 Ongoing  5 Pt will improve on the TUG test form 20s to <or= 17s Baseline: 10/20/2021 Achieved 10/13/21       6 Pt will climb 1 flight of steps indep and 7x weekly to demonstrate adequate strength to return sleeping in master bed Baseline: 3-4 x weekly 12/08/21 New  7 Pt will amb > 1000 ft continuously gaining community ambulation ability. Baseline: 853 12/08/21 New  8 Pt will report return of 90 to 95% of PLOF Baseline: 80-85% 12/08/21 New  9 Pt will ambulate with a cane safely and indep. Baseline: rollator 12/08/21 New    PLAN: PT FREQUENCY: 1-2x/week   PT DURATION: 8 weeks   PLANNED INTERVENTIONS: Therapeutic exercises, Therapeutic activity, Neuro Muscular re-education, Balance training, Gait training, Patient/Family education, Joint mobilization, Stair training, Aquatic Therapy, Taping, and Manual therapy   PLAN FOR NEXT SESSION:balance, stretching, core strengthening, stair climbing, gait and endurance training.    Stanton Kidney Tharon Aquas) Carmeron Heady MPT 11/17/21 9:13 AM

## 2021-11-20 ENCOUNTER — Encounter: Payer: Self-pay | Admitting: Physical Medicine and Rehabilitation

## 2021-11-20 ENCOUNTER — Encounter
Payer: Medicare Other | Attending: Physical Medicine and Rehabilitation | Admitting: Physical Medicine and Rehabilitation

## 2021-11-20 VITALS — BP 115/70 | HR 75 | Ht 63.0 in | Wt 141.0 lb

## 2021-11-20 DIAGNOSIS — G610 Guillain-Barre syndrome: Secondary | ICD-10-CM | POA: Insufficient documentation

## 2021-11-20 DIAGNOSIS — M19049 Primary osteoarthritis, unspecified hand: Secondary | ICD-10-CM | POA: Insufficient documentation

## 2021-11-20 DIAGNOSIS — R269 Unspecified abnormalities of gait and mobility: Secondary | ICD-10-CM | POA: Diagnosis present

## 2021-11-20 DIAGNOSIS — M21372 Foot drop, left foot: Secondary | ICD-10-CM | POA: Insufficient documentation

## 2021-11-20 DIAGNOSIS — M2170 Unequal limb length (acquired), unspecified site: Secondary | ICD-10-CM | POA: Insufficient documentation

## 2021-11-20 NOTE — Patient Instructions (Addendum)
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 Re for f/u on GBS. 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 Usually hit maximal improvement by 1 year.   2.  Can try to wean off Gabapentin- just stop and use as needed. Can always try Melatonin if needs something to sleep. Melatonin come sin gummies! :)   3. Can try Voltaren gel back for at least hand arthritis- can apply 4x/day as needed- don't wash hands immediately after application.    4. Still in Aqua therapy 1-2x week- and goes on own 1x/week- will con't until mid June- have them call me if they need to extend it.   5. Still weak in Hip flexion bilaterally- - can do marching and holding onto counter- suggest do daily- start with 20 reps and work your way up. Don't work till severe fatigue- takes a lot longer to recover with GBS.   6. For ankle weakness- at counter- is get up on toes - not real weak in these muscles- and heels- a few times for 10+ seconds at a time- do daily.    7. Can wait on sending to [facility], but suggest using shoe inserts- gel preferably- so doesn't lose height as much- can cut to fit both in R shoe only for now.   8. F/U 3-4 months- try cane for people with hand arthritis.

## 2021-11-20 NOTE — Progress Notes (Signed)
Subjective:    Patient ID: Angela Morales, female    DOB: Mar 26, 1946, 76 y.o.   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 Also has GBS pain- on Norco and also previous joint pain; also has aflutter- on amiodarone and Eliquis; Neurogenic bladder with retention on Flomax- peeing; and constipation/neurogenic bowel- reduced pain and improving bladder and improved strength  Here for f/u on GBS and associated Sx's.   Couldn't wear Foot up brace.  No more significant drop foot.  Was very painful on ankle.  Inversion is also better- not wearing AFO anymore.   Still doing aqua therapy- doing really well with strengthening/balance- will be released mid June.   Pain- most of pain having is arthritis related- hands, back and R hip- due to scoliosis.  If it's bad, will take Ibuprofen- 400 mg 2x/week- OK with Cardiologist.  Has tried 1x- wasn't real helpful for Voltaren gel. But was working when last tried it.   Decreased K+ to 40 mEq daily- and going next week for blood work.   Voiding- stopped Flomax and no issues  Driving- back to driving and no issues- goes anywhere she wants to go!   Walking with single point cane- sometimes uses Rollator- Cane in flat even surface.  Uses walker with uneven surfaces- gravel is bad! Also if gets tired-   Prior to GBS, wasn't using any assistive devices.   Pain Inventory Average Pain 2 Pain Right Now 2 My pain is intermittent, dull, and aching  In the last 24 hours, has pain interfered with the following? General activity 6 Relation with others 1 Enjoyment of life 4 What TIME of day is your pain at its worst? evening Sleep (in general) Good  Pain is worse with: bending and standing Pain improves with: rest, medication, and (taking Ibuprofen) Relief from Meds: 5  Family History  Problem Relation Age of Onset   Osteoporosis Mother    Stroke Mother         mini   Prostate cancer Father    Heart disease Brother 27       shunt put in heart   Osteoarthritis Maternal Grandmother    Bipolar disorder Son    Liver disease Son    Breast cancer Cousin        1st cousin   Colon cancer Neg Hx    Social History   Socioeconomic History   Marital status: Married    Spouse name: [name]   Number of children: 3   Years of education: Not on file   Highest education level: Not on file  Occupational History   Occupation: retired  Tobacco Use   Smoking status: Never   Smokeless tobacco: Never  Vaping Use   Vaping Use: Never used  Substance and Sexual Activity   Alcohol use: Yes    Alcohol/week: 4.0 - 6.0 standard drinks    Types: 4 - 6 Glasses of wine per week    Comment: occasional   Drug use: No   Sexual activity: Yes    Partners: Male    Birth control/protection: Surgical    Comment: TVH  Other Topics Concern   Not on file  Social History Narrative   Lives with husband and son   Left handed   Caffeine: 1 cup of coffee a day   Social Determinants of Health   Financial Resource Strain: Not on file  Food Insecurity: Not on file  Transportation Needs: Not on file  Physical Activity: Not on file  Stress: Not on file  Social Connections: Not on file   Past Surgical History:  Procedure Laterality Date   APPENDECTOMY     ATRIAL FIBRILLATION ABLATION N/A 01/28/2021   Procedure: [place];  Surgeon: Constance Haw, MD;  Location: [place] [facility];  Service: Cardiovascular;  Laterality: N/A;   CARDIOVERSION N/A 10/30/2020   Procedure: CARDIOVERSION;  Surgeon: Jerline Pain, MD;  Location: [facility] ENDOSCOPY;  Service: Cardiovascular;  Laterality: N/A;   CARDIOVERSION N/A 03/16/2021   Procedure: CARDIOVERSION;  Surgeon: Thayer Headings, MD;  Location: [facility] ENDOSCOPY;  Service: Cardiovascular;  Laterality: N/A;   COLONOSCOPY     FOOT FRACTURE SURGERY  10/2013   TONSILLECTOMY AND ADENOIDECTOMY     TOTAL KNEE ARTHROPLASTY  Left 03/22/2016   Procedure: TOTAL KNEE ARTHROPLASTY;  Surgeon: Dorna Leitz, MD;  Location: [place];  Service: Orthopedics;  Laterality: Left;   TUBAL LIGATION  1981   VAGINAL HYSTERECTOMY  04/20/05   prolapse, adenomyosis   Past Surgical History:  Procedure Laterality Date   APPENDECTOMY     ATRIAL FIBRILLATION ABLATION N/A 01/28/2021   Procedure: ATRIAL FIBRILLATION ABLATION;  Surgeon: Constance Haw, MD;  Location: [place] [facility];  Service: Cardiovascular;  Laterality: N/A;   CARDIOVERSION N/A 10/30/2020   Procedure: CARDIOVERSION;  Surgeon: Jerline Pain, MD;  Location: [facility] ENDOSCOPY;  Service: Cardiovascular;  Laterality: N/A;   CARDIOVERSION N/A 03/16/2021   Procedure: CARDIOVERSION;  Surgeon: Thayer Headings, MD;  Location: [facility] ENDOSCOPY;  Service: Cardiovascular;  Laterality: N/A;   COLONOSCOPY     FOOT FRACTURE SURGERY  10/2013   TONSILLECTOMY AND ADENOIDECTOMY     TOTAL KNEE ARTHROPLASTY Left 03/22/2016   Procedure: TOTAL KNEE ARTHROPLASTY;  Surgeon: Dorna Leitz, MD;  Location: [place];  Service: Orthopedics;  Laterality: Left;   TUBAL LIGATION  1981   VAGINAL HYSTERECTOMY  04/20/05   prolapse, adenomyosis   Past Medical History:  Diagnosis Date   Abnormal uterine bleeding    on HRT   Allergy    seasonal   Arthritis    Atrial fibrillation (HCC)    Celiac disease    DDD (degenerative disc disease)    Dysrhythmia    GBS (Guillain-Barre syndrome) (HCC)    Glaucoma    Microscopic colitis    Osteoarthritis of both knees    PONV (postoperative nausea and vomiting)    SVT (supraventricular tachycardia) (HCC)    Ht 5' 3"  (1.6 m)   Wt 141 lb (64 kg)   LMP 04/20/2005   BMI 24.98 kg/m   Opioid Risk Score:   Fall Risk Score:  `1  Depression screen [facility] 2/9     11/20/2021    9:08 AM 08/14/2021   10:18 AM 05/15/2021   11:37 AM  Depression screen PHQ 2/9  Decreased Interest 0 0 0  Down, Depressed, Hopeless 0 0 0  PHQ - 2 Score 0 0 0  Altered sleeping   2  Tired,  decreased energy   1  Change in appetite   0  Feeling bad or failure about yourself    1  Trouble concentrating   0  Moving slowly or fidgety/restless   0  Suicidal thoughts   0  PHQ-9 Score   4  Difficult doing work/chores   Not difficult at all    Review of Systems  Musculoskeletal:  Positive for back  pain and gait problem.       Pain the wrist & hands  All other systems reviewed and are negative.     Objective:   Physical Exam Awake, alert, appropriate, using SPC to walk, NAD Leg length discrepancy- L side 36.75 inches from ASIS to medial malleolus and R side 36.25 inches from same locations.  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 Re for f/u on GBS. 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 Usually hit maximal improvement by 1 year.   2.  Can try to wean off Gabapentin- just stop and use as needed. Can always try Melatonin if needs something to sleep. Melatonin come sin gummies! :)   3. Can try Voltaren gel back for at least hand arthritis- can apply 4x/day as needed- don't wash hands immediately after application.    4. Still in Aqua therapy 1-2x week- and goes on own 1x/week- will con't until mid June- have them call me if they need to extend it.   5. Still weak in Hip flexion bilaterally- - can do marching and holding  onto counter- suggest do daily- start with 20 reps and work your way up. Don't work till severe fatigue- takes a lot longer to recover with GBS.   6. For ankle weakness- at counter- is get up on toes - not real weak in these muscles- and heels- a few times for 10+ seconds at a time- do daily.    7. Can wait on sending to [place], but suggest using shoe inserts- gel preferably- so doesn't lose height as much- can cut to fit both in R shoe only for now.   8. F/U 3-4 months- try cane with people with hand arthritis.    I spent a total of 33   minutes on total care today- >50% coordination of care- due to prolonged time about gait; and leg length discrepancy.

## 2021-11-24 ENCOUNTER — Ambulatory Visit (HOSPITAL_BASED_OUTPATIENT_CLINIC_OR_DEPARTMENT_OTHER): Payer: Medicare Other | Admitting: Physical Therapy

## 2021-11-24 ENCOUNTER — Encounter (HOSPITAL_BASED_OUTPATIENT_CLINIC_OR_DEPARTMENT_OTHER): Payer: Self-pay | Admitting: Physical Therapy

## 2021-11-24 DIAGNOSIS — M6281 Muscle weakness (generalized): Secondary | ICD-10-CM | POA: Diagnosis not present

## 2021-11-24 DIAGNOSIS — R262 Difficulty in walking, not elsewhere classified: Secondary | ICD-10-CM

## 2021-11-24 DIAGNOSIS — G610 Guillain-Barre syndrome: Secondary | ICD-10-CM

## 2021-11-24 NOTE — Therapy (Signed)
OUTPATIENT PHYSICAL THERAPY TREATMENT NOTE    Progress Note/Re-certification Reporting Period 10/13/21 to 11/24/21  See note below for Objective Data and Assessment of Progress/Goals.     Patient Name: Angela Morales MRN: [id number] DOB:07-Sep-1945, 76 y.o., female Today's Date: 11/24/2021  PCP: Deland Pretty, MD REFERRING PROVIDER: Deland Pretty, MD   PT End of Session - 11/24/21 1036     Visit Number 20    Number of Visits 28    Date for PT Re-Evaluation 01/05/22    Authorization Time Period 11/24/21-01/05/22    Progress Note Due on Visit 7    PT Start Time 1031    PT Stop Time 1115    PT Time Calculation (min) 44 min    Activity Tolerance Patient tolerated treatment well    Behavior During Therapy WFL for tasks assessed/performed                  Past Medical History:  Diagnosis Date   Abnormal uterine bleeding    on HRT   Allergy    seasonal   Arthritis    Atrial fibrillation (HCC)    Celiac disease    DDD (degenerative disc disease)    Dysrhythmia    GBS (Guillain-Barre syndrome) (HCC)    Glaucoma    Microscopic colitis    Osteoarthritis of both knees    PONV (postoperative nausea and vomiting)    SVT (supraventricular tachycardia) ([place])    Past Surgical History:  Procedure Laterality Date   APPENDECTOMY     ATRIAL FIBRILLATION ABLATION N/A 01/28/2021   Procedure: ATRIAL FIBRILLATION ABLATION;  Surgeon: Constance Haw, MD;  Location: [place] [facility];  Service: Cardiovascular;  Laterality: N/A;   CARDIOVERSION N/A 10/30/2020   Procedure: CARDIOVERSION;  Surgeon: Jerline Pain, MD;  Location: [facility] ENDOSCOPY;  Service: Cardiovascular;  Laterality: N/A;   CARDIOVERSION N/A 03/16/2021   Procedure: CARDIOVERSION;  Surgeon: Thayer Headings, MD;  Location: [facility] ENDOSCOPY;  Service: Cardiovascular;  Laterality: N/A;   COLONOSCOPY     FOOT FRACTURE SURGERY  10/2013   TONSILLECTOMY AND ADENOIDECTOMY     TOTAL KNEE ARTHROPLASTY Left 03/22/2016    Procedure: TOTAL KNEE ARTHROPLASTY;  Surgeon: Dorna Leitz, MD;  Location: [place];  Service: Orthopedics;  Laterality: Left;   TUBAL LIGATION  1981   VAGINAL HYSTERECTOMY  04/20/05   prolapse, adenomyosis   Patient Active Problem List   Diagnosis Date Noted   Hand arthritis 11/20/2021   Acquired leg length discrepancy 11/20/2021   Abnormality of gait 08/14/2021   Left foot drop 08/14/2021   Wheelchair dependence 05/15/2021   Neurogenic bladder 04/09/2021   Atrial flutter with rapid ventricular response ([place]) 03/13/2021   Urinary retention    Chronic pain syndrome    GBS (Guillain-Barre syndrome) ([place]) 03/04/2021   Sleep disturbance    Atelectasis    Malnutrition of moderate degree 02/16/2021   Elevated white blood cell count    Status post tracheostomy ([place])    Oropharyngeal dysphagia    Acute respiratory failure with hypoxia (HCC)    GBS (Guillain Barre syndrome) ([place]) 02/06/2021   Chronic anticoagulation 02/06/2021   Hyponatremia 02/06/2021   Prolonged QT interval 02/06/2021   Generalized weakness 02/06/2021   Secondary hypercoagulable state ([place]) 11/13/2020   Primary osteoarthritis of left knee 03/22/2016   Atrial fibrillation with RVR ([place]) 12/01/2013   Paroxysmal atrial fibrillation ([place]) 11/30/2013   Hypotension 11/30/2013   Atrial flutter ([place]) 11/30/2013    REFERRING DIAG: K93.267 (ICD-10-CM) -  Spinal stenosis of lumbar region with neurogenic claudication  THERAPY DIAG:  Muscle weakness (generalized) - Plan: PT plan of care cert/re-cert  Difficulty in walking, not elsewhere classified - Plan: PT plan of care cert/re-cert  GBS (Guillain-Barre syndrome) ([place]) - Plan: PT plan of care cert/re-cert  PERTINENT HISTORY:  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  PRECAUTIONS: resolving foot drop L>R  SUBJECTIVE: "No pain coming in. I am at about 85% to my pre GB.  Dr Alveta Heimlich told me I would get back to  about where I am now.  Dr Alveta Heimlich measured a 3/4inch LL difference L>R.  I have a lift for my shoe"  PAIN:  Are you having pain? yes NPRS scale: 2/10 Pain location: hands  Pain orientation: bilat  PAIN TYPE: aching  Aggravating factors:  Relieving factors: rest   PRECAUTIONS: Other: resolving foot drop L>R   WEIGHT BEARING RESTRICTIONS No   FALLS:  Has patient fallen in last 6 months? No,    LIVING ENVIRONMENT: Lives with: lives with their family Lives in: House/apartment Stairs: Yes; External: 15 steps; on right going up Has following equipment at home: Single point cane, Walker - 2 wheeled, Wheelchair (manual), and Electronics engineer   OCCUPATION: retired   PLOF: Independent prior to GBS Aug 2022   PATIENT GOALS walk indep, car for myself, return to PLOF     OBJECTIVE:   * all objective findings taken at eval unless otherwise noted.      SCREENING FOR RED FLAGS: Bowel or bladder incontinence: No Spinal tumors: No Cauda equina syndrome: No Compression fracture: No Abdominal aneurysm: No   COGNITION:          Overall cognitive status: Within functional limits for tasks assessed                        SENSATION:          Light touch: Deficits Left distal foot decreased sensation.  Wears Left ankle inversion brace              MUSCLE LENGTH: Hamstrings: WFL     POSTURE:  Forward hip flex, asymmetry: left hip elevation, right shoulder elevation       LE AROM/PROM:   A/PROM Right 08/25/2021 Left 08/25/2021 Left 10/09/21  Hip flexion       Hip extension       Hip abduction       Hip adduction       Hip internal rotation       Hip external rotation       Knee flexion       Knee extension       Ankle dorsiflexion 5 -10 AROM: -8 in long sitting PROM: 1 in long sitting    Ankle plantarflexion full full   Ankle inversion       Ankle eversion        (Blank rows = not tested)   LE MMT:   MMT Right 08/25/2021 Left 08/25/2021 Right 10/13/21 Left 10/13/21     Hip flexion 3+ 3- 4 3+ 4+ 4-  Hip extension          Hip abduction 4 4 5- 4+ 5 5  Hip adduction 4 4 5- 5- 5 5  Hip internal rotation          Hip external rotation          Knee  flexion 4 4 5- 5- 5 5  Knee extension 4 4 5- 5- 5 5  Ankle dorsiflexion 4 3 5  3+ 5 4-   Ankle plantarflexion 4+ 3 5 3+  4-  Ankle inversion          Ankle eversion      3-  3-   (Blank rows = not tested)   Straight leg raise test: Negative and Slump test: Negative   FUNCTIONAL TESTS:  5 times sit to stand: 21 using ue from transport chair 10/09/21: 5x STS: 16.33 sec using UE, on hot tub surround 11/24/21: 16 Timed up and go (TUG): 20s 10/13/21 TUG 16s 11/24/21 16                     6  minute walk test  562f          10/13/21 6MWT 853 ft         11/24/21 987 ft GAIT: Distance walked: 550 ft Assistive device utilized: Walker - 2 wheeled Level of assistance: CGA Comments: Forward hip flex, limited heel strike bilaterally R<L, decreased step length and cadence.       TODAY'S TREATMENT  Pt seen for aquatic therapy today.  Treatment took place in water 3.25-4.295fin [facility] Temp of water was 92.  Pt entered/exited the pool via stairs step to pattern independently with bilat rail.  Walking forward/ backward/ side stepping x 4 laps of each for warm up  Seated on 4 (bottom) step: flutter kicking 3x25 reps; add/abd 3x25 for strengthening and aerobic capacity -STS from 4th step 2x5. Cues for core engagement and control.   Seated straddled on yellow noodle ue supported by yellow hand buoys  -cycling x8 widths Forward and back, alternating 20 quick to 20 slow revolutions -add/abd and scissoring 3 sets of 20   Standing -Noodle push down 2 x10 core engagement [id number] step holding to blue noodle x10 R/L  -Barre forward hurdles 2 widths ue supported with yellow hand buoys  -"stork stance" Hip IR/ER x10 R/L UE yellow hand buoys -hip openers -Monster walk x 2  widths.  Seated flutter and add/abd 3x25reps for aerobic capacity challenge.  Objective measurements taken  Pt requires buoyancy for support and to offload joints with strengthening exercises. Viscosity of the water is needed for resistance of strengthening; water current perturbations provides challenge to standing balance unsupported, requiring increased core activation.    PATIENT EDUCATION:  Education details: Progression of exercise Person educated: Patient and Caregiver Education method: Explanation Education comprehension: verbalized understanding     HOME EXERCISE PROGRAM:  stair climbing 2-3 x week with assistance Walk distance to pool with each visit. HEP: gastroc and hamstring stretching; flutter and add/abd in seated 3x20, STS from 3rd step x5. Pt to sleep upstairs 1 x weekly using smaller bathroom, increase stair climbing to 4xweekly    ASSESSMENT:   CLINICAL IMPRESSION: L Peroneus Brevis weakness 3-/5 increasingly evident as pt gains strength in LE's.  Anterior tib strengthening and P. Longus 4-/5. She is clearing her foot from the floor without difficulty (foot drop 95% resolved).Pt's functional scores are about the same since last tested but overall strength improved. She is ambulating 50% of time with cane and climbing stairs daily using handrail and cane. She amb safely with good coordination using cane as evidenced through assessment today.  No falls or near falls. She reports being ~ 85% of PLOF and improving daily. Improvements on 6MWT  to just shy of 1000 ft.  Pt and cg report ambulating > 1000 ft when walking in park or through warehouse store.  She has not yet met her max potential and will continue to progress to meet all goals set. At this time I will re-certify to extend through June with intension of soft hand off to the aquatic group in July.       OBJECTIVE IMPAIRMENTS Abnormal gait, decreased activity tolerance, decreased balance, decreased endurance,  decreased mobility, difficulty walking, decreased ROM, decreased strength, impaired sensation, and pain.    ACTIVITY LIMITATIONS cleaning, community activity, driving, meal prep, laundry, and shopping.    PERSONAL FACTORS Age and 3+ comorbidities:    are also affecting patient's functional outcome.      REHAB POTENTIAL: Good   CLINICAL DECISION MAKING: Evolving/moderate complexity   EVALUATION COMPLEXITY: Moderate     GOALS: Goals reviewed with patient? Yes   SHORT TERM GOALS:   STG Name Target Date Goal status  1 Pt will tolerate full sessions of aquatic therapy without reports of excessive fatigue or increase pain Baseline:  09/15/2021   Achieved 09/23/21  2 Pt will be indep and compliant with initial aquatic HEP Baseline:  09/15/2021  Achieved 09/22/21  3 Pt will tolerate amb one way (to or from) pool Baseline: 09/22/2021  Achieved 09/23/21  4 Pt will be able to walk 10 continuous minutes in pool (without rest period) Baseline: 09/22/2021  Achieved 10/01/21  LONG TERM GOALS:    LTG Name Target Date Goal status  1 Pt will be able to climb 15 steps to access her second floor of home Baseline: 10/20/2021 Achieved 10/01/21  2 Pt will be able to tolerate walking to and from pool for therapy Baseline: 10/20/2021 Achieved 10/09/21  3 Pt will improve on the 6 minute walk test ambulating > 800 ft with AD as needed Baseline: 10/20/2021 Achieved 10/13/21  4 Pt will improve on the 5 x STS test with completion in < 15s Baseline: 16.33 seconds 10/09/21 10/20/2021 Ongoing  5 Pt will improve on the TUG test form 20s to <or= 17s Baseline: 10/20/2021 Achieved 10/13/21       6 Pt will climb 1 flight of steps indep and 7x weekly to demonstrate adequate strength to return sleeping in master bed Baseline: 3-4 x weekly 12/08/21 Achieved  7 Pt will amb > 1000 ft continuously gaining community ambulation ability. Baseline: 853 12/08/21 Ongoing  8 Pt will report return of 90 to 95% of PLOF Baseline:  80-85% 12/08/21 Ongoing 85% 11/24/21  9 Pt will ambulate with a cane safely and indep. Baseline: rollator 12/08/21 Achieved    PLAN: PT FREQUENCY: 1-2x/week   PT DURATION: 8 weeks   PLANNED INTERVENTIONS: Peroneus Brevis strengthening. Therapeutic exercises, Therapeutic activity, Neuro Muscular re-education, Balance training, Gait training, Patient/Family education, Joint mobilization, Stair training, Aquatic Therapy, Taping, and Manual therapy   PLAN FOR NEXT SESSION:balance, stretching, core strengthening, stair climbing, gait and endurance training.    Stanton Kidney Tharon Aquas) Jenaro Souder MPT 11/24/21 12:17 PM

## 2021-11-27 ENCOUNTER — Encounter (HOSPITAL_BASED_OUTPATIENT_CLINIC_OR_DEPARTMENT_OTHER): Payer: Self-pay | Admitting: Physical Therapy

## 2021-11-27 ENCOUNTER — Ambulatory Visit (HOSPITAL_BASED_OUTPATIENT_CLINIC_OR_DEPARTMENT_OTHER): Payer: Medicare Other | Attending: Physical Medicine and Rehabilitation | Admitting: Physical Therapy

## 2021-11-27 DIAGNOSIS — G610 Guillain-Barre syndrome: Secondary | ICD-10-CM | POA: Insufficient documentation

## 2021-11-27 DIAGNOSIS — R262 Difficulty in walking, not elsewhere classified: Secondary | ICD-10-CM | POA: Diagnosis not present

## 2021-11-27 DIAGNOSIS — M6281 Muscle weakness (generalized): Secondary | ICD-10-CM | POA: Insufficient documentation

## 2021-11-27 NOTE — Therapy (Signed)
OUTPATIENT PHYSICAL THERAPY TREATMENT NOTE         Patient Name: Angela Morales MRN: [id number] DOB:14-Dec-1945, 76 y.o., female Today's Date: 11/27/2021  PCP: Deland Pretty, MD REFERRING PROVIDER: Deland Pretty, MD   PT End of Session - 11/27/21 1155     Visit Number 21    Number of Visits 28    Date for PT Re-Evaluation 01/05/22    Authorization Time Period 11/24/21-01/05/22    PT Start Time 1120    PT Stop Time 1159    PT Time Calculation (min) 39 min    Activity Tolerance Patient tolerated treatment well    Behavior During Therapy WFL for tasks assessed/performed                   Past Medical History:  Diagnosis Date   Abnormal uterine bleeding    on HRT   Allergy    seasonal   Arthritis    Atrial fibrillation (HCC)    Celiac disease    DDD (degenerative disc disease)    Dysrhythmia    GBS (Guillain-Barre syndrome) (HCC)    Glaucoma    Microscopic colitis    Osteoarthritis of both knees    PONV (postoperative nausea and vomiting)    SVT (supraventricular tachycardia) ([place])    Past Surgical History:  Procedure Laterality Date   APPENDECTOMY     ATRIAL FIBRILLATION ABLATION N/A 01/28/2021   Procedure: ATRIAL FIBRILLATION ABLATION;  Surgeon: Constance Haw, MD;  Location: [facility];  Service: Cardiovascular;  Laterality: N/A;   CARDIOVERSION N/A 10/30/2020   Procedure: CARDIOVERSION;  Surgeon: Jerline Pain, MD;  Location: [facility] ENDOSCOPY;  Service: Cardiovascular;  Laterality: N/A;   CARDIOVERSION N/A 03/16/2021   Procedure: CARDIOVERSION;  Surgeon: Thayer Headings, MD;  Location: [facility] ENDOSCOPY;  Service: Cardiovascular;  Laterality: N/A;   COLONOSCOPY     FOOT FRACTURE SURGERY  10/2013   TONSILLECTOMY AND ADENOIDECTOMY     TOTAL KNEE ARTHROPLASTY Left 03/22/2016   Procedure: TOTAL KNEE ARTHROPLASTY;  Surgeon: Dorna Leitz, MD;  Location: [facility];  Service: Orthopedics;  Laterality: Left;   TUBAL LIGATION  1981   VAGINAL HYSTERECTOMY   04/20/05   prolapse, adenomyosis   Patient Active Problem List   Diagnosis Date Noted   Hand arthritis 11/20/2021   Acquired leg length discrepancy 11/20/2021   Abnormality of gait 08/14/2021   Left foot drop 08/14/2021   Wheelchair dependence 05/15/2021   Neurogenic bladder 04/09/2021   Atrial flutter with rapid ventricular response ([place]) 03/13/2021   Urinary retention    Chronic pain syndrome    GBS (Guillain-Barre syndrome) ([place]) 03/04/2021   Sleep disturbance    Atelectasis    Malnutrition of moderate degree 02/16/2021   Elevated white blood cell count    Status post tracheostomy ([place])    Oropharyngeal dysphagia    Acute respiratory failure with hypoxia (HCC)    GBS (Guillain Barre syndrome) ([place]) 02/06/2021   Chronic anticoagulation 02/06/2021   Hyponatremia 02/06/2021   Prolonged QT interval 02/06/2021   Generalized weakness 02/06/2021   Secondary hypercoagulable state ([place]) 11/13/2020   Primary osteoarthritis of left knee 03/22/2016   Atrial fibrillation with RVR ([place]) 12/01/2013   Paroxysmal atrial fibrillation ([place]) 11/30/2013   Hypotension 11/30/2013   Atrial flutter ([place]) 11/30/2013    REFERRING DIAG: A41.660 (ICD-10-CM) - Spinal stenosis of lumbar region with neurogenic claudication  THERAPY DIAG:  Muscle weakness (generalized)  Difficulty in walking, not elsewhere classified  GBS (Guillain-Barre  syndrome) ([place])  PERTINENT HISTORY:  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  PRECAUTIONS: resolving foot drop L>R  SUBJECTIVE: "I have been walking to and from the pool using cane like you asked. I don't think I am any more soar or tired"  PAIN:  Are you having pain? yes NPRS scale: 2/10 Pain location: hands  Pain orientation: bilat  PAIN TYPE: aching  Aggravating factors:  Relieving factors: rest   PRECAUTIONS: Other: resolving foot drop L>R   WEIGHT BEARING RESTRICTIONS No   FALLS:   Has patient fallen in last 6 months? No,    LIVING ENVIRONMENT: Lives with: lives with their family Lives in: House/apartment Stairs: Yes; External: 15 steps; on right going up Has following equipment at home: Single point cane, Walker - 2 wheeled, Wheelchair (manual), and Electronics engineer   OCCUPATION: retired   PLOF: Independent prior to GBS Aug 2022   PATIENT GOALS walk indep, car for myself, return to PLOF     OBJECTIVE:   * all objective findings taken at eval unless otherwise noted.      SCREENING FOR RED FLAGS: Bowel or bladder incontinence: No Spinal tumors: No Cauda equina syndrome: No Compression fracture: No Abdominal aneurysm: No   COGNITION:          Overall cognitive status: Within functional limits for tasks assessed                        SENSATION:          Light touch: Deficits Left distal foot decreased sensation.  Wears Left ankle inversion brace              MUSCLE LENGTH: Hamstrings: WFL     POSTURE:  Forward hip flex, asymmetry: left hip elevation, right shoulder elevation       LE AROM/PROM:   A/PROM Right 08/25/2021 Left 08/25/2021 Left 10/09/21  Hip flexion       Hip extension       Hip abduction       Hip adduction       Hip internal rotation       Hip external rotation       Knee flexion       Knee extension       Ankle dorsiflexion 5 -10 AROM: -8 in long sitting PROM: 1 in long sitting    Ankle plantarflexion full full   Ankle inversion       Ankle eversion        (Blank rows = not tested)   LE MMT:   MMT Right 08/25/2021 Left 08/25/2021 Right 10/13/21 Left 10/13/21    Hip flexion 3+ 3- 4 3+ 4+ 4-  Hip extension          Hip abduction 4 4 5- 4+ 5 5  Hip adduction 4 4 5- 5- 5 5  Hip internal rotation          Hip external rotation          Knee flexion 4 4 5- 5- 5 5  Knee extension 4 4 5- 5- 5 5  Ankle dorsiflexion 4 3 5  3+ 5 4-   Ankle plantarflexion 4+ 3 5 3+  4-  Ankle inversion          Ankle eversion      3-   3-   (Blank rows = not tested)   Straight  leg raise test: Negative and Slump test: Negative   FUNCTIONAL TESTS:  5 times sit to stand: 21 using ue from transport chair 10/09/21: 5x STS: 16.33 sec using UE, on hot tub surround 11/24/21: 16 Timed up and go (TUG): 20s 10/13/21 TUG 16s 11/24/21 16                     6  minute walk test  5753f          10/13/21 6MWT 853 ft         11/24/21 987 ft GAIT: Distance walked: 550 ft Assistive device utilized: Walker - 2 wheeled Level of assistance: CGA Comments: Forward hip flex, limited heel strike bilaterally R<L, decreased step length and cadence.       TODAY'S TREATMENT  Pt seen for aquatic therapy today.  Treatment took place in water 3.25-4.237fin [facility] Temp of water was 92.  Pt entered/exited the pool via stairs step to pattern independently with bilat rail.  Walking forward/ backward/ side stepping x 4 laps of each for warm up  Seated on 4 (bottom) step:  -STS from 4th step 2x5. Cues for core engagement and control. Kicking 3x25 reps (flutter, add/abd , cycling)   Seated straddled on yellow noodle ue supported by yellow hand buoys  -cycling x8 widths Forward and back,  -add/abd and scissoring 3 sets of 20   Standing -Noodle push down 2 x10 core engagement [id number]-3way step holding to blue noodle x10 R/L  Holding to wall (due to increased back pain  -Barre forward hurdles x10  -"stork stance" Hip IR/ER x10 R/L -hip openers x10 -hurdles x 10   Pt requires buoyancy for support and to offload joints with strengthening exercises. Viscosity of the water is needed for resistance of strengthening; water current perturbations provides challenge to standing balance unsupported, requiring increased core activation.    PATIENT EDUCATION:  Education details: Progression of exercise Person educated: Patient and Caregiver Education method: Explanation Education comprehension: verbalized understanding      HOME EXERCISE PROGRAM:  stair climbing 2-3 x week with assistance Walk distance to pool with each visit. HEP: gastroc and hamstring stretching; flutter and add/abd in seated 3x20, STS from 3rd step x5. Pt to sleep upstairs 1 x weekly using smaller bathroom, increase stair climbing to 4xweekly    ASSESSMENT:   CLINICAL IMPRESSION: Pt with reports of back fatigue early on in session. She is given a seated rest period following the standing exercises to relieve.  Pt continues to report improving ability with walking in stores and was able to use escalator without difficulty (with cg). She is tolerating walking to and from pool with each visit well. She continues to make good progress.  Goals ongoing       OBJECTIVE IMPAIRMENTS Abnormal gait, decreased activity tolerance, decreased balance, decreased endurance, decreased mobility, difficulty walking, decreased ROM, decreased strength, impaired sensation, and pain.    ACTIVITY LIMITATIONS cleaning, community activity, driving, meal prep, laundry, and shopping.    PERSONAL FACTORS Age and 3+ comorbidities:    are also affecting patient's functional outcome.      REHAB POTENTIAL: Good   CLINICAL DECISION MAKING: Evolving/moderate complexity   EVALUATION COMPLEXITY: Moderate     GOALS: Goals reviewed with patient? Yes   SHORT TERM GOALS:   STG Name Target Date Goal status  1 Pt will tolerate full sessions of aquatic therapy without reports of excessive fatigue or increase pain Baseline:  09/15/2021   Achieved 09/23/21  2 Pt will be indep and compliant with initial aquatic HEP Baseline:  09/15/2021  Achieved 09/22/21  3 Pt will tolerate amb one way (to or from) pool Baseline: 09/22/2021  Achieved 09/23/21  4 Pt will be able to walk 10 continuous minutes in pool (without rest period) Baseline: 09/22/2021  Achieved 10/01/21  LONG TERM GOALS:    LTG Name Target Date Goal status  1 Pt will be able to climb 15 steps to access  her second floor of home Baseline: 10/20/2021 Achieved 10/01/21  2 Pt will be able to tolerate walking to and from pool for therapy Baseline: 10/20/2021 Achieved 10/09/21  3 Pt will improve on the 6 minute walk test ambulating > 800 ft with AD as needed Baseline: 10/20/2021 Achieved 10/13/21  4 Pt will improve on the 5 x STS test with completion in < 15s Baseline: 16.33 seconds 10/09/21 10/20/2021 Ongoing  5 Pt will improve on the TUG test form 20s to <or= 17s Baseline: 10/20/2021 Achieved 10/13/21       6 Pt will climb 1 flight of steps indep and 7x weekly to demonstrate adequate strength to return sleeping in master bed Baseline: 3-4 x weekly 12/08/21 Achieved  7 Pt will amb > 1000 ft continuously gaining community ambulation ability. Baseline: 853 12/08/21 Ongoing  8 Pt will report return of 90 to 95% of PLOF Baseline: 80-85% 12/08/21 Ongoing 85% 11/24/21  9 Pt will ambulate with a cane safely and indep. Baseline: rollator 12/08/21 Achieved    PLAN: PT FREQUENCY: 1-2x/week   PT DURATION: 8 weeks   PLANNED INTERVENTIONS: Peroneus Brevis strengthening. Therapeutic exercises, Therapeutic activity, Neuro Muscular re-education, Balance training, Gait training, Patient/Family education, Joint mobilization, Stair training, Aquatic Therapy, Taping, and Manual therapy   PLAN FOR NEXT SESSION:balance, stretching, core strengthening, stair climbing, gait and endurance training.    Stanton Kidney Tharon Aquas) Jaxyn Mestas MPT 11/27/21 11:58 AM

## 2021-11-30 ENCOUNTER — Other Ambulatory Visit: Payer: Self-pay | Admitting: Physical Medicine and Rehabilitation

## 2021-12-01 ENCOUNTER — Ambulatory Visit (HOSPITAL_BASED_OUTPATIENT_CLINIC_OR_DEPARTMENT_OTHER): Payer: Medicare Other | Admitting: Physical Therapy

## 2021-12-01 ENCOUNTER — Encounter (HOSPITAL_BASED_OUTPATIENT_CLINIC_OR_DEPARTMENT_OTHER): Payer: Self-pay | Admitting: Physical Therapy

## 2021-12-01 DIAGNOSIS — M6281 Muscle weakness (generalized): Secondary | ICD-10-CM

## 2021-12-01 DIAGNOSIS — R262 Difficulty in walking, not elsewhere classified: Secondary | ICD-10-CM | POA: Diagnosis not present

## 2021-12-01 DIAGNOSIS — G610 Guillain-Barre syndrome: Secondary | ICD-10-CM | POA: Diagnosis not present

## 2021-12-01 NOTE — Therapy (Signed)
OUTPATIENT PHYSICAL THERAPY TREATMENT NOTE         Patient Name: Angela Morales MRN: [id number] DOB:18-May-1946, 76 y.o., female Today's Date: 12/01/2021  PCP: Deland Pretty, MD REFERRING PROVIDER: Deland Pretty, MD   PT End of Session - 12/01/21 0859     Visit Number 22    Number of Visits 28    Date for PT Re-Evaluation 01/05/22    Authorization Time Period 11/24/21-01/05/22    PT Start Time 0900    PT Stop Time 0945    PT Time Calculation (min) 45 min                   Past Medical History:  Diagnosis Date   Abnormal uterine bleeding    on HRT   Allergy    seasonal   Arthritis    Atrial fibrillation (HCC)    Celiac disease    DDD (degenerative disc disease)    Dysrhythmia    GBS (Guillain-Barre syndrome) ([place])    Glaucoma    Microscopic colitis    Osteoarthritis of both knees    PONV (postoperative nausea and vomiting)    SVT (supraventricular tachycardia) ([place])    Past Surgical History:  Procedure Laterality Date   APPENDECTOMY     ATRIAL FIBRILLATION ABLATION N/A 01/28/2021   Procedure: ATRIAL FIBRILLATION ABLATION;  Surgeon: Constance Haw, MD;  Location: [facility];  Service: Cardiovascular;  Laterality: N/A;   CARDIOVERSION N/A 10/30/2020   Procedure: CARDIOVERSION;  Surgeon: Jerline Pain, MD;  Location: [facility] ENDOSCOPY;  Service: Cardiovascular;  Laterality: N/A;   CARDIOVERSION N/A 03/16/2021   Procedure: CARDIOVERSION;  Surgeon: Thayer Headings, MD;  Location: [facility] ENDOSCOPY;  Service: Cardiovascular;  Laterality: N/A;   COLONOSCOPY     FOOT FRACTURE SURGERY  10/2013   TONSILLECTOMY AND ADENOIDECTOMY     TOTAL KNEE ARTHROPLASTY Left 03/22/2016   Procedure: TOTAL KNEE ARTHROPLASTY;  Surgeon: Dorna Leitz, MD;  Location: [place];  Service: Orthopedics;  Laterality: Left;   TUBAL LIGATION  1981   VAGINAL HYSTERECTOMY  04/20/05   prolapse, adenomyosis   Patient Active Problem List   Diagnosis Date Noted   Hand arthritis 11/20/2021    Acquired leg length discrepancy 11/20/2021   Abnormality of gait 08/14/2021   Left foot drop 08/14/2021   Wheelchair dependence 05/15/2021   Neurogenic bladder 04/09/2021   Atrial flutter with rapid ventricular response ([place]) 03/13/2021   Urinary retention    Chronic pain syndrome    GBS (Guillain-Barre syndrome) ([place]) 03/04/2021   Sleep disturbance    Atelectasis    Malnutrition of moderate degree 02/16/2021   Elevated white blood cell count    Status post tracheostomy ([place])    Oropharyngeal dysphagia    Acute respiratory failure with hypoxia (HCC)    GBS (Guillain Barre syndrome) ([place]) 02/06/2021   Chronic anticoagulation 02/06/2021   Hyponatremia 02/06/2021   Prolonged QT interval 02/06/2021   Generalized weakness 02/06/2021   Secondary hypercoagulable state ([place]) 11/13/2020   Primary osteoarthritis of left knee 03/22/2016   Atrial fibrillation with RVR ([place]) 12/01/2013   Paroxysmal atrial fibrillation ([place]) 11/30/2013   Hypotension 11/30/2013   Atrial flutter ([place]) 11/30/2013    REFERRING DIAG: Y10.175 (ICD-10-CM) - Spinal stenosis of lumbar region with neurogenic claudication  THERAPY DIAG:  Muscle weakness (generalized)  Difficulty in walking, not elsewhere classified  GBS (Guillain-Barre syndrome) ([place])  PERTINENT HISTORY:  hx of Guillain Barre Syndrome/AIDP with B/L foot drop-   also has  aflutter- on Eliquis, spinal stenosis.  PRECAUTIONS: resolving foot drop L>R  SUBJECTIVE: "I am most limited by back fatigue.  It makes me stop and rest. On amiodarone which keeps my pulse around 70 no matter how hard I exert. "  PAIN:  Are you having pain? yes NPRS scale: 2/10 Pain location: hands  Pain orientation: bilat  PAIN TYPE: aching  Aggravating factors:  Relieving factors: rest   PRECAUTIONS: Other: resolving foot drop L>R   WEIGHT BEARING RESTRICTIONS No   FALLS:  Has patient fallen in last 6 months? No,    LIVING ENVIRONMENT: Lives with: lives with  their family Lives in: House/apartment Stairs: Yes; External: 15 steps; on right going up Has following equipment at home: Single point cane, Walker - 2 wheeled, Wheelchair (manual), and Electronics engineer   OCCUPATION: retired   PLOF: Independent prior to GBS Aug 2022   PATIENT GOALS walk indep, car for myself, return to PLOF     OBJECTIVE:   * all objective findings taken at eval unless otherwise noted.      SCREENING FOR RED FLAGS: Bowel or bladder incontinence: No Spinal tumors: No Cauda equina syndrome: No Compression fracture: No Abdominal aneurysm: No   COGNITION:          Overall cognitive status: Within functional limits for tasks assessed                        SENSATION:          Light touch: Deficits Left distal foot decreased sensation.  Wears Left ankle inversion brace              MUSCLE LENGTH: Hamstrings: WFL     POSTURE:  Forward hip flex, asymmetry: left hip elevation, right shoulder elevation       LE AROM/PROM:   A/PROM Right 08/25/2021 Left 08/25/2021 Left 10/09/21  Hip flexion       Hip extension       Hip abduction       Hip adduction       Hip internal rotation       Hip external rotation       Knee flexion       Knee extension       Ankle dorsiflexion 5 -10 AROM: -8 in long sitting PROM: 1 in long sitting    Ankle plantarflexion full full   Ankle inversion       Ankle eversion        (Blank rows = not tested)   LE MMT:   MMT Right 08/25/2021 Left 08/25/2021 Right 10/13/21 Left 10/13/21    Hip flexion 3+ 3- 4 3+ 4+ 4-  Hip extension          Hip abduction 4 4 5- 4+ 5 5  Hip adduction 4 4 5- 5- 5 5  Hip internal rotation          Hip external rotation          Knee flexion 4 4 5- 5- 5 5  Knee extension 4 4 5- 5- 5 5  Ankle dorsiflexion 4 3 5  3+ 5 4-   Ankle plantarflexion 4+ 3 5 3+  4-  Ankle inversion          Ankle eversion      3-  3-   (Blank rows = not tested)   Straight leg raise test: Negative and Slump test:  Negative   FUNCTIONAL TESTS:  5  times sit to stand: 21 using ue from transport chair 10/09/21: 5x STS: 16.33 sec using UE, on hot tub surround 11/24/21: 16 Timed up and go (TUG): 20s 10/13/21 TUG 16s 11/24/21 16                     6  minute walk test  569f          10/13/21 6MWT 853 ft         11/24/21 987 ft GAIT: Distance walked: 550 ft Assistive device utilized: Walker - 2 wheeled Level of assistance: CGA Comments: Forward hip flex, limited heel strike bilaterally R<L, decreased step length and cadence.       TODAY'S TREATMENT  Pt seen for aquatic therapy today.  Treatment took place in water 3.25-4.22fin [facility] Temp of water was 92.  Pt entered/exited the pool via stairs step to pattern independently with bilat rail.  Walking forward/ backward/ side stepping x 4 laps of each for warm up  Supported by yellow noodle  -Barre forward hurdles x10  -"stork stance" Hip IR/ER x10 R/L -hip openers x10 -DF/PF 2x10 -add/abd x10 -Noodle push down 2 x10 core engagement 24f85ftanding stretching QL towards left/opposite curve 3x30s  Seated on 4 (bottom) step:  -STS from 4th step 2x5. Cues for core engagement and control. Kicking 3x25 reps (flutter, add/abd ,cycling)   Pt requires buoyancy for support and to offload joints with strengthening exercises. Viscosity of the water is needed for resistance of strengthening; water current perturbations provides challenge to standing balance unsupported, requiring increased core activation.    PATIENT EDUCATION:  Education details: Progression of exercise Person educated: Patient and Caregiver Education method: Explanation Education comprehension: verbalized understanding     HOME EXERCISE PROGRAM:  stair climbing 2-3 x week with assistance Walk distance to pool with each visit. HEP: gastroc and hamstring stretching; flutter and add/abd in seated 3x20, STS from 3rd step x5. Pt to sleep upstairs 1 x weekly  using smaller bathroom, increase stair climbing to 4xweekly    ASSESSMENT:   CLINICAL IMPRESSION: Main focus is core strengthening and stretching today. She is given multiple seated rest periods throughout.  Pt endurance and toleration to activity has improved nicely.  Main complaint and limiting factor is back fatigue. Pain increases today from 2/10 to 4-5/10. She uses jacuzzi for water massage prior to leaving (unbilled). Beginning to prepare for future DC beginning to plan on final aquatic HEP.       OBJECTIVE IMPAIRMENTS Abnormal gait, decreased activity tolerance, decreased balance, decreased endurance, decreased mobility, difficulty walking, decreased ROM, decreased strength, impaired sensation, and pain.    ACTIVITY LIMITATIONS cleaning, community activity, driving, meal prep, laundry, and shopping.    PERSONAL FACTORS Age and 3+ comorbidities:    are also affecting patient's functional outcome.      REHAB POTENTIAL: Good   CLINICAL DECISION MAKING: Evolving/moderate complexity   EVALUATION COMPLEXITY: Moderate     GOALS: Goals reviewed with patient? Yes   SHORT TERM GOALS:   STG Name Target Date Goal status  1 Pt will tolerate full sessions of aquatic therapy without reports of excessive fatigue or increase pain Baseline:  09/15/2021   Achieved 09/23/21  2 Pt will be indep and compliant with initial aquatic HEP Baseline:  09/15/2021  Achieved 09/22/21  3 Pt will tolerate amb one way (to or from) pool Baseline: 09/22/2021  Achieved 09/23/21  4 Pt will be able to walk 10 continuous  minutes in pool (without rest period) Baseline: 09/22/2021  Achieved 10/01/21  LONG TERM GOALS:    LTG Name Target Date Goal status  1 Pt will be able to climb 15 steps to access her second floor of home Baseline: 10/20/2021 Achieved 10/01/21  2 Pt will be able to tolerate walking to and from pool for therapy Baseline: 10/20/2021 Achieved 10/09/21  3 Pt will improve on the 6 minute walk  test ambulating > 800 ft with AD as needed Baseline: 10/20/2021 Achieved 10/13/21  4 Pt will improve on the 5 x STS test with completion in < 15s Baseline: 16.33 seconds 10/09/21 10/20/2021 Ongoing  5 Pt will improve on the TUG test form 20s to <or= 17s Baseline: 10/20/2021 Achieved 10/13/21       6 Pt will climb 1 flight of steps indep and 7x weekly to demonstrate adequate strength to return sleeping in master bed Baseline: 3-4 x weekly 12/08/21 Achieved  7 Pt will amb > 1000 ft continuously gaining community ambulation ability. Baseline: 853 12/08/21 Ongoing  8 Pt will report return of 90 to 95% of PLOF Baseline: 80-85% 12/08/21 Ongoing 85% 11/24/21  9 Pt will ambulate with a cane safely and indep. Baseline: rollator 12/08/21 Achieved    PLAN: PT FREQUENCY: 1-2x/week   PT DURATION: 8 weeks   PLANNED INTERVENTIONS: Peroneus Brevis strengthening. Therapeutic exercises, Therapeutic activity, Neuro Muscular re-education, Balance training, Gait training, Patient/Family education, Joint mobilization, Stair training, Aquatic Therapy, Taping, and Manual therapy   PLAN FOR NEXT SESSION:balance, stretching, core strengthening, gait and endurance training.    Zarek Relph (Frankie) Tyleigh Mahn MPT 12/01/21 9:00 AM

## 2021-12-02 DIAGNOSIS — Z6824 Body mass index (BMI) 24.0-24.9, adult: Secondary | ICD-10-CM | POA: Diagnosis not present

## 2021-12-04 ENCOUNTER — Ambulatory Visit (HOSPITAL_BASED_OUTPATIENT_CLINIC_OR_DEPARTMENT_OTHER): Payer: Medicare Other | Admitting: Physical Therapy

## 2021-12-04 DIAGNOSIS — R262 Difficulty in walking, not elsewhere classified: Secondary | ICD-10-CM

## 2021-12-04 DIAGNOSIS — M6281 Muscle weakness (generalized): Secondary | ICD-10-CM | POA: Diagnosis not present

## 2021-12-04 DIAGNOSIS — G610 Guillain-Barre syndrome: Secondary | ICD-10-CM

## 2021-12-04 NOTE — Therapy (Signed)
OUTPATIENT PHYSICAL THERAPY TREATMENT NOTE         Patient Name: Angela Morales MRN: [id number] DOB:[birth year], 76 y.o., female Today's Date: 12/04/2021  PCP: Deland Pretty, MD REFERRING PROVIDER: Deland Pretty, MD   PT End of Session - 12/04/21 0931     Visit Number 23    Number of Visits 28    Date for PT Re-Evaluation 01/05/22    Authorization Time Period 11/24/21-01/05/22    PT Start Time 0927    PT Stop Time 1010    PT Time Calculation (min) 43 min    Activity Tolerance Patient tolerated treatment well    Behavior During Therapy [facility] for tasks assessed/performed                   Past Medical History:  Diagnosis Date   Abnormal uterine bleeding    on HRT   Allergy    seasonal   Arthritis    Atrial fibrillation (HCC)    Celiac disease    DDD (degenerative disc disease)    Dysrhythmia    GBS (Guillain-Barre syndrome) (HCC)    Glaucoma    Microscopic colitis    Osteoarthritis of both knees    PONV (postoperative nausea and vomiting)    SVT (supraventricular tachycardia) ([place])    Past Surgical History:  Procedure Laterality Date   APPENDECTOMY     ATRIAL FIBRILLATION ABLATION N/A 01/28/2021   Procedure: ATRIAL FIBRILLATION ABLATION;  Surgeon: Constance Haw, MD;  Location: [place] [facility];  Service: Cardiovascular;  Laterality: N/A;   CARDIOVERSION N/A 10/30/2020   Procedure: CARDIOVERSION;  Surgeon: Jerline Pain, MD;  Location: [facility] ENDOSCOPY;  Service: Cardiovascular;  Laterality: N/A;   CARDIOVERSION N/A 03/16/2021   Procedure: CARDIOVERSION;  Surgeon: Thayer Headings, MD;  Location: [facility] ENDOSCOPY;  Service: Cardiovascular;  Laterality: N/A;   COLONOSCOPY     FOOT FRACTURE SURGERY  10/2013   TONSILLECTOMY AND ADENOIDECTOMY     TOTAL KNEE ARTHROPLASTY Left 03/22/2016   Procedure: TOTAL KNEE ARTHROPLASTY;  Surgeon: Dorna Leitz, MD;  Location: [place];  Service: Orthopedics;  Laterality: Left;   TUBAL LIGATION  1981   VAGINAL HYSTERECTOMY   04/20/05   prolapse, adenomyosis   Patient Active Problem List   Diagnosis Date Noted   Hand arthritis 11/20/2021   Acquired leg length discrepancy 11/20/2021   Abnormality of gait 08/14/2021   Left foot drop 08/14/2021   Wheelchair dependence 05/15/2021   Neurogenic bladder 04/09/2021   Atrial flutter with rapid ventricular response ([place]) 03/13/2021   Urinary retention    Chronic pain syndrome    GBS (Guillain-Barre syndrome) ([place]) 03/04/2021   Sleep disturbance    Atelectasis    Malnutrition of moderate degree 02/16/2021   Elevated white blood cell count    Status post tracheostomy ([place])    Oropharyngeal dysphagia    Acute respiratory failure with hypoxia (HCC)    GBS (Guillain Barre syndrome) ([place]) 02/06/2021   Chronic anticoagulation 02/06/2021   Hyponatremia 02/06/2021   Prolonged QT interval 02/06/2021   Generalized weakness 02/06/2021   Secondary hypercoagulable state ([place]) 11/13/2020   Primary osteoarthritis of left knee 03/22/2016   Atrial fibrillation with RVR ([place]) 12/01/2013   Paroxysmal atrial fibrillation ([place]) 11/30/2013   Hypotension 11/30/2013   Atrial flutter ([place]) 11/30/2013    REFERRING DIAG: V78.469 (ICD-10-CM) - Spinal stenosis of lumbar region with neurogenic claudication  THERAPY DIAG:  Muscle weakness (generalized)  Difficulty in walking, not elsewhere classified  GBS (Guillain-Barre  syndrome) ([place])  PERTINENT HISTORY:  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  PRECAUTIONS: resolving foot drop L>R  SUBJECTIVE:  Pt presents with bruise to L forearm; pt describes incident where the walker hit her arm during transport.  She reports she has been dizzy upon waking the last few days.  PAIN:  Are you having pain? no NPRS scale: 0/10 Pain location:  Pain orientation:  PAIN TYPE:   Aggravating factors:  Relieving factors: rest   PRECAUTIONS: Other: resolving foot drop L>R    WEIGHT BEARING RESTRICTIONS No   FALLS:  Has patient fallen in last 6 months? No,    LIVING ENVIRONMENT: Lives with: lives with their family Lives in: House/apartment Stairs: Yes; External: 15 steps; on right going up Has following equipment at home: Single point cane, Walker - 2 wheeled, Wheelchair (manual), and Electronics engineer   OCCUPATION: retired   PLOF: Independent prior to GBS Aug 2022   PATIENT GOALS walk indep, car for myself, return to PLOF     OBJECTIVE:   * all objective findings taken at eval unless otherwise noted.      SCREENING FOR RED FLAGS: Bowel or bladder incontinence: No Spinal tumors: No Cauda equina syndrome: No Compression fracture: No Abdominal aneurysm: No   COGNITION:          Overall cognitive status: Within functional limits for tasks assessed                        SENSATION:          Light touch: Deficits Left distal foot decreased sensation.  Wears Left ankle inversion brace              MUSCLE LENGTH: Hamstrings: WFL     POSTURE:  Forward hip flex, asymmetry: left hip elevation, right shoulder elevation       LE AROM/PROM:   A/PROM Right 08/25/2021 Left 08/25/2021 Left 10/09/21  Hip flexion       Hip extension       Hip abduction       Hip adduction       Hip internal rotation       Hip external rotation       Knee flexion       Knee extension       Ankle dorsiflexion 5 -10 AROM: -8 in long sitting PROM: 1 in long sitting    Ankle plantarflexion full full   Ankle inversion       Ankle eversion        (Blank rows = not tested)   LE MMT:   MMT Right 08/25/2021 Left 08/25/2021 Right 10/13/21 Left 10/13/21    Hip flexion 3+ 3- 4 3+ 4+ 4-  Hip extension          Hip abduction 4 4 5- 4+ 5 5  Hip adduction 4 4 5- 5- 5 5  Hip internal rotation          Hip external rotation          Knee flexion 4 4 5- 5- 5 5  Knee extension 4 4 5- 5- 5 5  Ankle dorsiflexion 4 3 5  3+ 5 4-   Ankle plantarflexion 4+ 3 5 3+  4-  Ankle  inversion          Ankle eversion      3-  3-   (Blank rows =  not tested)   Straight leg raise test: Negative and Slump test: Negative   FUNCTIONAL TESTS:  5 times sit to stand: 21 using ue from transport chair 10/09/21: 5x STS: 16.33 sec using UE, on hot tub surround 11/24/21: 16 Timed up and go (TUG): 20s 10/13/21 TUG 16s 11/24/21 16                     6  minute walk test  583f          10/13/21 6MWT 853 ft         11/24/21 987 ft GAIT: Distance walked: 550 ft Assistive device utilized: Walker - 2 wheeled Level of assistance: CGA Comments: Forward hip flex, limited heel strike bilaterally R<L, decreased step length and cadence.       TODAY'S TREATMENT  Pt seen for aquatic therapy today.  Treatment took place in water 3.25-4.213fin [facility] Temp of water was 92.  Pt entered/exited the pool via stairs step to pattern independently with bilat rail.  -Warm up of Walking forward/ backward/ side stepping x 4 laps of each for warm up - UE on yellow noodle: SLS stork stance x 3 reps of 15s -forward hurdle walk -Monster walk forward -Plank with hands on bench with hip ext  -Noodle push down 2 x10 core engagement [id number] wall - split squats x 12 each LE - side stepping with arms abdct/ add with red paddles on R/L  - Ai chi in staggered stance:  uplifting, enclosing -Straddling yellow noodle:  cycling with breast stroke arms  Trial of sensitive skin Rock tape applied over bruise on Lt forearm to assist with bruise/edema reduction  Pt requires buoyancy for support and to offload joints with strengthening exercises. Viscosity of the water is needed for resistance of strengthening; water current perturbations provides challenge to standing balance unsupported, requiring increased core activation.    PATIENT EDUCATION:  Education details: Progression of exercise Person educated: Patient and Caregiver Education method: Explanation Education  comprehension: verbalized understanding     HOME EXERCISE PROGRAM:  stair climbing 2-3 x week with assistance Walk distance to pool with each visit. HEP: gastroc and hamstring stretching; flutter and add/abd in seated 3x20, STS from 3rd step x5. Pt to sleep upstairs 1 x weekly using smaller bathroom, increase stair climbing to 4xweekly    ASSESSMENT:   CLINICAL IMPRESSION: Improved STS time; has met this goal.  Unable to complete walking forward lunges with UE support; good tolerance holding wall.  Focus in session on upright posture with core engagement; slowing speed of exercise to achieve better postural control.  No increase in low back pain.  She is challenged with staggered stance with ai chi arms.  Pt making great progress towards remaining goals.    OBJECTIVE IMPAIRMENTS Abnormal gait, decreased activity tolerance, decreased balance, decreased endurance, decreased mobility, difficulty walking, decreased ROM, decreased strength, impaired sensation, and pain.    ACTIVITY LIMITATIONS cleaning, community activity, driving, meal prep, laundry, and shopping.    PERSONAL FACTORS Age and 3+ comorbidities:    are also affecting patient's functional outcome.      REHAB POTENTIAL: Good   CLINICAL DECISION MAKING: Evolving/moderate complexity   EVALUATION COMPLEXITY: Moderate     GOALS: Goals reviewed with patient? Yes   SHORT TERM GOALS:   STG Name Target Date Goal status  1 Pt will tolerate full sessions of aquatic therapy without reports of excessive fatigue or increase pain Baseline:  09/15/2021   Achieved 09/23/21  2 Pt will be indep and compliant with initial aquatic HEP Baseline:  09/15/2021  Achieved 09/22/21  3 Pt will tolerate amb one way (to or from) pool Baseline: 09/22/2021  Achieved 09/23/21  4 Pt will be able to walk 10 continuous minutes in pool (without rest period) Baseline: 09/22/2021  Achieved 10/01/21  LONG TERM GOALS:    LTG Name Target Date Goal  status  1 Pt will be able to climb 15 steps to access her second floor of home Baseline: 10/20/2021 Achieved 10/01/21  2 Pt will be able to tolerate walking to and from pool for therapy Baseline: 10/20/2021 Achieved 10/09/21  3 Pt will improve on the 6 minute walk test ambulating > 800 ft with AD as needed Baseline: 10/20/2021 Achieved 10/13/21  4 Pt will improve on the 5 x STS test with completion in < 15s Baseline:12.59 12/04/21 10/20/2021 Achieved 12/04/21  5 Pt will improve on the TUG test form 20s to <or= 17s Baseline: 10/20/2021 Achieved 10/13/21       6 Pt will climb 1 flight of steps indep and 7x weekly to demonstrate adequate strength to return sleeping in master bed Baseline: 3-4 x weekly 12/08/21 Achieved  7 Pt will amb > 1000 ft continuously gaining community ambulation ability. Baseline: 853 12/08/21 Ongoing  8 Pt will report return of 90 to 95% of PLOF Baseline: 80-85% 12/08/21 Ongoing 85% 11/24/21  9 Pt will ambulate with a cane safely and indep. Baseline: rollator 12/08/21 Achieved    PLAN: PT FREQUENCY: 1-2x/week   PT DURATION: 8 weeks   PLANNED INTERVENTIONS: Peroneus Brevis strengthening. Therapeutic exercises, Therapeutic activity, Neuro Muscular re-education, Balance training, Gait training, Patient/Family education, Joint mobilization, Stair training, Aquatic Therapy, Taping, and Manual therapy   PLAN FOR NEXT SESSION:balance, stretching, core strengthening, gait and endurance training.  Assess response to tape application.   Kerin Perna, PTA 12/04/21 10:09 AM

## 2021-12-06 DIAGNOSIS — G610 Guillain-Barre syndrome: Secondary | ICD-10-CM | POA: Diagnosis not present

## 2021-12-08 ENCOUNTER — Encounter (HOSPITAL_BASED_OUTPATIENT_CLINIC_OR_DEPARTMENT_OTHER): Payer: Self-pay | Admitting: Physical Therapy

## 2021-12-08 ENCOUNTER — Ambulatory Visit (HOSPITAL_BASED_OUTPATIENT_CLINIC_OR_DEPARTMENT_OTHER): Payer: Medicare Other | Admitting: Physical Therapy

## 2021-12-08 DIAGNOSIS — G610 Guillain-Barre syndrome: Secondary | ICD-10-CM | POA: Diagnosis not present

## 2021-12-08 DIAGNOSIS — R262 Difficulty in walking, not elsewhere classified: Secondary | ICD-10-CM

## 2021-12-08 DIAGNOSIS — M6281 Muscle weakness (generalized): Secondary | ICD-10-CM

## 2021-12-08 NOTE — Therapy (Signed)
OUTPATIENT PHYSICAL THERAPY TREATMENT NOTE         Patient Name: Angela Morales MRN: [id number] DOB:1945-10-18, 76 y.o., female Today's Date: 12/08/2021  PCP: Deland Pretty, MD REFERRING PROVIDER: Deland Pretty, MD   PT End of Session - 12/08/21 0905     Visit Number 24    Number of Visits 28    Date for PT Re-Evaluation 01/05/22    Authorization Time Period 11/24/21-01/05/22    Progress Note Due on Visit 51    PT Start Time 0900    PT Stop Time 0945    PT Time Calculation (min) 45 min    Activity Tolerance Patient tolerated treatment well    Behavior During Therapy WFL for tasks assessed/performed                   Past Medical History:  Diagnosis Date   Abnormal uterine bleeding    on HRT   Allergy    seasonal   Arthritis    Atrial fibrillation (HCC)    Celiac disease    DDD (degenerative disc disease)    Dysrhythmia    GBS (Guillain-Barre syndrome) (HCC)    Glaucoma    Microscopic colitis    Osteoarthritis of both knees    PONV (postoperative nausea and vomiting)    SVT (supraventricular tachycardia) ([place])    Past Surgical History:  Procedure Laterality Date   APPENDECTOMY     ATRIAL FIBRILLATION ABLATION N/A 01/28/2021   Procedure: ATRIAL FIBRILLATION ABLATION;  Surgeon: Constance Haw, MD;  Location: [place] [facility];  Service: Cardiovascular;  Laterality: N/A;   CARDIOVERSION N/A 10/30/2020   Procedure: CARDIOVERSION;  Surgeon: Jerline Pain, MD;  Location: [facility] ENDOSCOPY;  Service: Cardiovascular;  Laterality: N/A;   CARDIOVERSION N/A 03/16/2021   Procedure: CARDIOVERSION;  Surgeon: Thayer Headings, MD;  Location: [facility] ENDOSCOPY;  Service: Cardiovascular;  Laterality: N/A;   COLONOSCOPY     FOOT FRACTURE SURGERY  10/2013   TONSILLECTOMY AND ADENOIDECTOMY     TOTAL KNEE ARTHROPLASTY Left 03/22/2016   Procedure: TOTAL KNEE ARTHROPLASTY;  Surgeon: Dorna Leitz, MD;  Location: [place];  Service: Orthopedics;  Laterality: Left;   TUBAL LIGATION   1981   VAGINAL HYSTERECTOMY  04/20/05   prolapse, adenomyosis   Patient Active Problem List   Diagnosis Date Noted   Hand arthritis 11/20/2021   Acquired leg length discrepancy 11/20/2021   Abnormality of gait 08/14/2021   Left foot drop 08/14/2021   Wheelchair dependence 05/15/2021   Neurogenic bladder 04/09/2021   Atrial flutter with rapid ventricular response ([place]) 03/13/2021   Urinary retention    Chronic pain syndrome    GBS (Guillain-Barre syndrome) ([place]) 03/04/2021   Sleep disturbance    Atelectasis    Malnutrition of moderate degree 02/16/2021   Elevated white blood cell count    Status post tracheostomy ([place])    Oropharyngeal dysphagia    Acute respiratory failure with hypoxia (HCC)    GBS (Guillain Barre syndrome) ([place]) 02/06/2021   Chronic anticoagulation 02/06/2021   Hyponatremia 02/06/2021   Prolonged QT interval 02/06/2021   Generalized weakness 02/06/2021   Secondary hypercoagulable state ([place]) 11/13/2020   Primary osteoarthritis of left knee 03/22/2016   Atrial fibrillation with RVR ([place]) 12/01/2013   Paroxysmal atrial fibrillation ([place]) 11/30/2013   Hypotension 11/30/2013   Atrial flutter ([place]) 11/30/2013    REFERRING DIAG: W40.973 (ICD-10-CM) - Spinal stenosis of lumbar region with neurogenic claudication  THERAPY DIAG:  Muscle weakness (generalized)  Difficulty in walking, not elsewhere classified  GBS (Guillain-Barre syndrome) (HCC)  PERTINENT HISTORY:  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  PRECAUTIONS: resolving foot drop L>R  SUBJECTIVE: Arm bruise is not painful.   PAIN:  Are you having pain? no NPRS scale: 0/10 Pain location:  Pain orientation:  PAIN TYPE:   Aggravating factors:  Relieving factors: rest   PRECAUTIONS: Other: resolving foot drop L>R   WEIGHT BEARING RESTRICTIONS No   FALLS:  Has patient fallen in last 6 months? No,    LIVING  ENVIRONMENT: Lives with: lives with their family Lives in: House/apartment Stairs: Yes; External: 15 steps; on right going up Has following equipment at home: Single point cane, Walker - 2 wheeled, Wheelchair (manual), and Electronics engineer   OCCUPATION: retired   PLOF: Independent prior to GBS Aug 2022   PATIENT GOALS walk indep, car for myself, return to PLOF     OBJECTIVE:   * all objective findings taken at eval unless otherwise noted.      SCREENING FOR RED FLAGS: Bowel or bladder incontinence: No Spinal tumors: No Cauda equina syndrome: No Compression fracture: No Abdominal aneurysm: No   COGNITION:          Overall cognitive status: Within functional limits for tasks assessed                        SENSATION:          Light touch: Deficits Left distal foot decreased sensation.  Wears Left ankle inversion brace              MUSCLE LENGTH: Hamstrings: WFL     POSTURE:  Forward hip flex, asymmetry: left hip elevation, right shoulder elevation       LE AROM/PROM:   A/PROM Right 08/25/2021 Left 08/25/2021 Left 10/09/21  Hip flexion       Hip extension       Hip abduction       Hip adduction       Hip internal rotation       Hip external rotation       Knee flexion       Knee extension       Ankle dorsiflexion 5 -10 AROM: -8 in long sitting PROM: 1 in long sitting    Ankle plantarflexion full full   Ankle inversion       Ankle eversion        (Blank rows = not tested)   LE MMT:   MMT Right 08/25/2021 Left 08/25/2021 Right 10/13/21 Left 10/13/21    Hip flexion 3+ 3- 4 3+ 4+ 4-  Hip extension          Hip abduction 4 4 5- 4+ 5 5  Hip adduction 4 4 5- 5- 5 5  Hip internal rotation          Hip external rotation          Knee flexion 4 4 5- 5- 5 5  Knee extension 4 4 5- 5- 5 5  Ankle dorsiflexion 4 3 5  3+ 5 4-   Ankle plantarflexion 4+ 3 5 3+  4-  Ankle inversion          Ankle eversion      3-  3-   (Blank rows = not tested)   Straight leg raise  test: Negative and Slump test: Negative   FUNCTIONAL  TESTS:  5 times sit to stand: 21 using ue from transport chair 10/09/21: 5x STS: 16.33 sec using UE, on hot tub surround 11/24/21: 16 Timed up and go (TUG): 20s 10/13/21 TUG 16s 11/24/21 16                     6  minute walk test  581f          10/13/21 6MWT 853 ft         11/24/21 987 ft GAIT: Distance walked: 550 ft Assistive device utilized: Walker - 2 wheeled Level of assistance: CGA Comments: Forward hip flex, limited heel strike bilaterally R<L, decreased step length and cadence.       TODAY'S TREATMENT  Pt seen for aquatic therapy today.  Treatment took place in water 3.25-4.276fin depth at the [facility]. Temp of water was 92.  Pt entered/exited the pool via stairs step to pattern independently with bilat rail.  -Warm up of Walking forward/ backward/ side stepping x 4 laps of each for warm up -R QL stretch - UE on yellow noodle: SLS stork stance x 3 reps of 15s -forward hurdle walk 4 widths -Monster walk forward 4 widths -Plank with hands on bench with hip ext 2x10 -Noodle push down 2 x10 core engagement [id number] wall - split squats x 12 each LE - side stepping with arms abdct/ add with red paddles on R/L  -Straddling yellow noodle:  cycling with breast stroke arms x 6 widths -resisted left QL/side bending using yellow hand buoy  Sensitive skin Rock tape applied over bruise on Lt forearm to assist with bruise/edema reduction  Pt requires buoyancy for support and to offload joints with strengthening exercises. Viscosity of the water is needed for resistance of strengthening; water current perturbations provides challenge to standing balance unsupported, requiring increased core activation.    PATIENT EDUCATION:  Education details: Progression of exercise Person educated: Patient and Caregiver Education method: Explanation Education comprehension: verbalized understanding     HOME EXERCISE  PROGRAM:  stair climbing 2-3 x week with assistance Walk distance to pool with each visit. HEP: gastroc and hamstring stretching; flutter and add/abd in seated 3x20, STS from 3rd step x5. Pt to sleep upstairs 1 x weekly using smaller bathroom, increase stair climbing to 4xweekly Left QL strengthening/    ASSESSMENT:   CLINICAL IMPRESSION: Complaints of right hip "twinge" with side stepping towards right. Stretching right QL relieves. Added left QL strengthening to session today with instruction for her to add to HEP (in pool as she completes on Saturdays) to oppose right spine curvature. She reports some discomfort in right sided LB with exercise. Goals ongoing.      OBJECTIVE IMPAIRMENTS Abnormal gait, decreased activity tolerance, decreased balance, decreased endurance, decreased mobility, difficulty walking, decreased ROM, decreased strength, impaired sensation, and pain.    ACTIVITY LIMITATIONS cleaning, community activity, driving, meal prep, laundry, and shopping.    PERSONAL FACTORS Age and 3+ comorbidities:    are also affecting patient's functional outcome.      REHAB POTENTIAL: Good   CLINICAL DECISION MAKING: Evolving/moderate complexity   EVALUATION COMPLEXITY: Moderate     GOALS: Goals reviewed with patient? Yes   SHORT TERM GOALS:   STG Name Target Date Goal status  1 Pt will tolerate full sessions of aquatic therapy without reports of excessive fatigue or increase pain Baseline:  09/15/2021   Achieved 09/23/21  2 Pt will be indep and compliant with initial aquatic HEP  Baseline:  09/15/2021  Achieved 09/22/21  3 Pt will tolerate amb one way (to or from) pool Baseline: 09/22/2021  Achieved 09/23/21  4 Pt will be able to walk 10 continuous minutes in pool (without rest period) Baseline: 09/22/2021  Achieved 10/01/21  LONG TERM GOALS:    LTG Name Target Date Goal status  1 Pt will be able to climb 15 steps to access her second floor of home Baseline:  10/20/2021 Achieved 10/01/21  2 Pt will be able to tolerate walking to and from pool for therapy Baseline: 10/20/2021 Achieved 10/09/21  3 Pt will improve on the 6 minute walk test ambulating > 800 ft with AD as needed Baseline: 10/20/2021 Achieved 10/13/21  4 Pt will improve on the 5 x STS test with completion in < 15s Baseline:12.59 12/04/21 10/20/2021 Achieved 12/04/21  5 Pt will improve on the TUG test form 20s to <or= 17s Baseline: 10/20/2021 Achieved 10/13/21       6 Pt will climb 1 flight of steps indep and 7x weekly to demonstrate adequate strength to return sleeping in master bed Baseline: 3-4 x weekly 12/08/21 Achieved  7 Pt will amb > 1000 ft continuously gaining community ambulation ability. Baseline: 853 12/08/21 Ongoing  8 Pt will report return of 90 to 95% of PLOF Baseline: 80-85% 12/08/21 Ongoing 85% 11/24/21  9 Pt will ambulate with a cane safely and indep. Baseline: rollator 12/08/21 Achieved    PLAN: PT FREQUENCY: 1-2x/week   PT DURATION: 8 weeks   PLANNED INTERVENTIONS: Peroneus Brevis strengthening. Therapeutic exercises, Therapeutic activity, Neuro Muscular re-education, Balance training, Gait training, Patient/Family education, Joint mobilization, Stair training, Aquatic Therapy, Taping, and Manual therapy   PLAN FOR NEXT SESSION:balance, stretching, core strengthening, gait and endurance training.  Assess response to tape application.   Stanton Kidney Tharon Aquas) Ardelia Wrede MPT 12/08/21 1:57 PM

## 2021-12-14 ENCOUNTER — Ambulatory Visit (HOSPITAL_BASED_OUTPATIENT_CLINIC_OR_DEPARTMENT_OTHER): Payer: Medicare Other | Admitting: Physical Therapy

## 2021-12-14 ENCOUNTER — Encounter (HOSPITAL_BASED_OUTPATIENT_CLINIC_OR_DEPARTMENT_OTHER): Payer: Self-pay | Admitting: Physical Therapy

## 2021-12-14 DIAGNOSIS — M6281 Muscle weakness (generalized): Secondary | ICD-10-CM

## 2021-12-14 DIAGNOSIS — G610 Guillain-Barre syndrome: Secondary | ICD-10-CM

## 2021-12-14 DIAGNOSIS — R262 Difficulty in walking, not elsewhere classified: Secondary | ICD-10-CM

## 2021-12-14 NOTE — Therapy (Signed)
OUTPATIENT PHYSICAL THERAPY TREATMENT NOTE         Patient Name: Angela Morales MRN: [id number] DOB:12-11-1945, 76 y.o., female Today's Date: 12/14/2021  PCP: Deland Pretty, MD REFERRING PROVIDER: Deland Pretty, MD   PT End of Session - 12/14/21 1108     Visit Number 25    Number of Visits 28    Date for PT Re-Evaluation 01/05/22    Authorization Time Period 11/24/21-01/05/22    Progress Note Due on Visit 74    PT Start Time 1101    PT Stop Time 1144    PT Time Calculation (min) 43 min    Activity Tolerance Patient tolerated treatment well    Behavior During Therapy WFL for tasks assessed/performed                   Past Medical History:  Diagnosis Date   Abnormal uterine bleeding    on HRT   Allergy    seasonal   Arthritis    Atrial fibrillation (HCC)    Celiac disease    DDD (degenerative disc disease)    Dysrhythmia    GBS (Guillain-Barre syndrome) (HCC)    Glaucoma    Microscopic colitis    Osteoarthritis of both knees    PONV (postoperative nausea and vomiting)    SVT (supraventricular tachycardia) ([place])    Past Surgical History:  Procedure Laterality Date   APPENDECTOMY     ATRIAL FIBRILLATION ABLATION N/A 01/28/2021   Procedure: ATRIAL FIBRILLATION ABLATION;  Surgeon: Constance Haw, MD;  Location: [facility];  Service: Cardiovascular;  Laterality: N/A;   CARDIOVERSION N/A 10/30/2020   Procedure: CARDIOVERSION;  Surgeon: Jerline Pain, MD;  Location: [facility] ENDOSCOPY;  Service: Cardiovascular;  Laterality: N/A;   CARDIOVERSION N/A 03/16/2021   Procedure: CARDIOVERSION;  Surgeon: Thayer Headings, MD;  Location: [facility] ENDOSCOPY;  Service: Cardiovascular;  Laterality: N/A;   COLONOSCOPY     FOOT FRACTURE SURGERY  10/2013   TONSILLECTOMY AND ADENOIDECTOMY     TOTAL KNEE ARTHROPLASTY Left 03/22/2016   Procedure: TOTAL KNEE ARTHROPLASTY;  Surgeon: Dorna Leitz, MD;  Location: [facility];  Service: Orthopedics;  Laterality: Left;   TUBAL LIGATION   1981   VAGINAL HYSTERECTOMY  04/20/05   prolapse, adenomyosis   Patient Active Problem List   Diagnosis Date Noted   Hand arthritis 11/20/2021   Acquired leg length discrepancy 11/20/2021   Abnormality of gait 08/14/2021   Left foot drop 08/14/2021   Wheelchair dependence 05/15/2021   Neurogenic bladder 04/09/2021   Atrial flutter with rapid ventricular response ([place]) 03/13/2021   Urinary retention    Chronic pain syndrome    GBS (Guillain-Barre syndrome) ([place]) 03/04/2021   Sleep disturbance    Atelectasis    Malnutrition of moderate degree 02/16/2021   Elevated white blood cell count    Status post tracheostomy ([place])    Oropharyngeal dysphagia    Acute respiratory failure with hypoxia (HCC)    GBS (Guillain Barre syndrome) ([place]) 02/06/2021   Chronic anticoagulation 02/06/2021   Hyponatremia 02/06/2021   Prolonged QT interval 02/06/2021   Generalized weakness 02/06/2021   Secondary hypercoagulable state ([place]) 11/13/2020   Primary osteoarthritis of left knee 03/22/2016   Atrial fibrillation with RVR ([place]) 12/01/2013   Paroxysmal atrial fibrillation ([place]) 11/30/2013   Hypotension 11/30/2013   Atrial flutter ([place]) 11/30/2013    REFERRING DIAG: O32.919 (ICD-10-CM) - Spinal stenosis of lumbar region with neurogenic claudication  THERAPY DIAG:  Muscle weakness (generalized)  Difficulty in walking, not elsewhere classified  GBS (Guillain-Barre syndrome) (HCC)  PERTINENT HISTORY:  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  PRECAUTIONS: resolving foot drop L>R  SUBJECTIVE: pt reports that she came to pool on Saturday with husband.  She walked in/out of facility with use of cane and holding her own bag.  She states that she was able to walk from pool to dressing room by herself ( holding cane/ bag).    PAIN:  Are you having pain? yes NPRS scale: 2/10 Pain location: R low back Pain orientation:  PAIN TYPE:  dull  Aggravating factors:  Relieving factors: rest   PRECAUTIONS: Other: resolving foot drop L>R   WEIGHT BEARING RESTRICTIONS No   FALLS:  Has patient fallen in last 6 months? No,    LIVING ENVIRONMENT: Lives with: lives with their family Lives in: House/apartment Stairs: Yes; External: 15 steps; on right going up Has following equipment at home: Single point cane, Walker - 2 wheeled, Wheelchair (manual), and Electronics engineer   OCCUPATION: retired   PLOF: Independent prior to GBS Aug 2022   PATIENT GOALS walk indep, car for myself, return to PLOF     OBJECTIVE:   * all objective findings taken at eval unless otherwise noted.      SCREENING FOR RED FLAGS: Bowel or bladder incontinence: No Spinal tumors: No Cauda equina syndrome: No Compression fracture: No Abdominal aneurysm: No   COGNITION:          Overall cognitive status: Within functional limits for tasks assessed                        SENSATION:          Light touch: Deficits Left distal foot decreased sensation.  Wears Left ankle inversion brace              MUSCLE LENGTH: Hamstrings: WFL     POSTURE:  Forward hip flex, asymmetry: left hip elevation, right shoulder elevation       LE AROM/PROM:   A/PROM Right 08/25/2021 Left 08/25/2021 Left 10/09/21  Hip flexion       Hip extension       Hip abduction       Hip adduction       Hip internal rotation       Hip external rotation       Knee flexion       Knee extension       Ankle dorsiflexion 5 -10 AROM: -8 in long sitting PROM: 1 in long sitting    Ankle plantarflexion full full   Ankle inversion       Ankle eversion        (Blank rows = not tested)   LE MMT:   MMT Right 08/25/2021 Left 08/25/2021 Right 10/13/21 Left 10/13/21    Hip flexion 3+ 3- 4 3+ 4+ 4-  Hip extension          Hip abduction 4 4 5- 4+ 5 5  Hip adduction 4 4 5- 5- 5 5  Hip internal rotation          Hip external rotation          Knee flexion 4 4 5- 5- 5 5  Knee  extension 4 4 5- 5- 5 5  Ankle dorsiflexion 4 3 5  3+ 5 4-   Ankle plantarflexion 4+ 3 5 3+  4-  Ankle inversion          Ankle eversion      3-  3-   (Blank rows = not tested)   Straight leg raise test: Negative and Slump test: Negative   FUNCTIONAL TESTS:  5 times sit to stand: 21 using ue from transport chair 10/09/21: 5x STS: 16.33 sec using UE, on hot tub surround 11/24/21: 16 Timed up and go (TUG): 20s 10/13/21 TUG 16s 11/24/21 16                     6  minute walk test  579f          10/13/21 6MWT 853 ft         11/24/21 987 ft GAIT: Distance walked: 550 ft Assistive device utilized: Walker - 2 wheeled Level of assistance: CGA Comments: Forward hip flex, limited heel strike bilaterally R<L, decreased step length and cadence.       TODAY'S TREATMENT  Pt seen for aquatic therapy today.  Treatment took place in water 3.25-4.243fin [facility] Temp of water was 90.  Pt entered/exited the pool via stairs step to pattern independently with bilat rail.  -Warm up of Walking forward/ backward/ side stepping x 4 laps of each for warm up -High knee marching forward / backward with rainbow hand buoys at side - side stepping with arms abdct/ add with rainbow hand buoys on R/L  - split squats holding wall x 10 each -blue noodle push down x 10 - Forward walking kicks -Straddling yellow noodle:  cycling with breast stroke arms x 6 widths; scissors (some Rt hip pain)  - Stork stance with hip IR/ER x 10 each LE, holding yellow noodle - braiding -holding yellow noodle; cues for sequence and to decrease step length for improved balance - holding yellow noodle - forward hurdle walk, focus on upright posture instead of speed -R QL stretch -Plank with hands on bench with hip ext x 10 each LE -seated on 4th step, leaning on elbows - alternating hip abdct/ add and flutter kick - STS from 4th step, no UE support x 5  Pt requires buoyancy for support and to offload  joints with strengthening exercises. Viscosity of the water is needed for resistance of strengthening; water current perturbations provides challenge to standing balance unsupported, requiring increased core activation.    PATIENT EDUCATION:  Education details: Progression of exercise Person educated: Patient and Caregiver Education method: Explanation Education comprehension: verbalized understanding     HOME EXERCISE PROGRAM:  stair climbing 2-3 x week with assistance Walk distance to pool with each visit. HEP: gastroc and hamstring stretching; flutter and add/abd in seated 3x20, STS from 3rd step x5. Pt to sleep upstairs 1 x weekly using smaller bathroom, increase stair climbing to 4xweekly Left QL strengthening/    ASSESSMENT:   CLINICAL IMPRESSION: Pt reports minimal change to Rt low back pain during session.  She reported sharp Rt hip pain with braiding Rt. Also demonstrated less balance / quicker fatigue with braiding to the R. Pt has a good aquatic HEP established; will tweak as need over remaining visits. Issue laminated HEP if needed.  Pt is near meeting remaining goals.       OBJECTIVE IMPAIRMENTS Abnormal gait, decreased activity tolerance, decreased balance, decreased endurance, decreased mobility, difficulty walking, decreased ROM, decreased strength, impaired sensation, and pain.    ACTIVITY LIMITATIONS cleaning, community activity, driving, meal prep, laundry, and shopping.  PERSONAL FACTORS Age and 3+ comorbidities:    are also affecting patient's functional outcome.      REHAB POTENTIAL: Good   CLINICAL DECISION MAKING: Evolving/moderate complexity   EVALUATION COMPLEXITY: Moderate     GOALS: Goals reviewed with patient? Yes   SHORT TERM GOALS:   STG Name Target Date Goal status  1 Pt will tolerate full sessions of aquatic therapy without reports of excessive fatigue or increase pain Baseline:  09/15/2021   Achieved 09/23/21  2 Pt will be indep  and compliant with initial aquatic HEP Baseline:  09/15/2021  Achieved 09/22/21  3 Pt will tolerate amb one way (to or from) pool Baseline:  09/22/2021  Achieved 09/23/21  4 Pt will be able to walk 10 continuous minutes in pool (without rest period) Baseline: 09/22/2021  Achieved 10/01/21  LONG TERM GOALS:    LTG Name Target Date Goal status  1 Pt will be able to climb 15 steps to access her second floor of home Baseline: 10/20/2021 Achieved 10/01/21  2 Pt will be able to tolerate walking to and from pool for therapy Baseline: 10/20/2021 Achieved 10/09/21  3 Pt will improve on the 6 minute walk test ambulating > 800 ft with AD as needed Baseline: 10/20/2021 Achieved 10/13/21  4 Pt will improve on the 5 x STS test with completion in < 15s Baseline:12.59 12/04/21 10/20/2021 Achieved 12/04/21  5 Pt will improve on the TUG test form 20s to <or= 17s Baseline: 10/20/2021 Achieved 10/13/21       6 Pt will climb 1 flight of steps indep and 7x weekly to demonstrate adequate strength to return sleeping in master bed Baseline: 3-4 x weekly 12/08/21 Achieved  7 Pt will amb > 1000 ft continuously gaining community ambulation ability. Baseline: 987 ft 12/08/21 Ongoing  8 Pt will report return of 90 to 95% of PLOF Baseline: 80-85% 12/08/21 Ongoing 85% 11/24/21  9 Pt will ambulate with a cane safely and indep. Baseline: rollator 12/08/21 Achieved    PLAN: PT FREQUENCY: 1-2x/week   PT DURATION: 8 weeks   PLANNED INTERVENTIONS: Peroneus Brevis strengthening. Therapeutic exercises, Therapeutic activity, Neuro Muscular re-education, Balance training, Gait training, Patient/Family education, Joint mobilization, Stair training, Aquatic Therapy, Taping, and Manual therapy   PLAN FOR NEXT SESSION:balance, stretching, core strengthening, gait and endurance training.  Assess response to tape application.   Kerin Perna, PTA 12/14/21 11:44 AM

## 2021-12-16 ENCOUNTER — Ambulatory Visit (HOSPITAL_BASED_OUTPATIENT_CLINIC_OR_DEPARTMENT_OTHER): Payer: Medicare Other | Admitting: Physical Therapy

## 2021-12-16 ENCOUNTER — Encounter (HOSPITAL_BASED_OUTPATIENT_CLINIC_OR_DEPARTMENT_OTHER): Payer: Self-pay | Admitting: Physical Therapy

## 2021-12-16 DIAGNOSIS — M6281 Muscle weakness (generalized): Secondary | ICD-10-CM

## 2021-12-16 DIAGNOSIS — R262 Difficulty in walking, not elsewhere classified: Secondary | ICD-10-CM | POA: Diagnosis not present

## 2021-12-16 DIAGNOSIS — G610 Guillain-Barre syndrome: Secondary | ICD-10-CM | POA: Diagnosis not present

## 2021-12-16 NOTE — Therapy (Signed)
OUTPATIENT PHYSICAL THERAPY TREATMENT NOTE         Patient Name: Angela Morales MRN: [id number] DOB:[birth year], 76 y.o., female Today's Date: 12/16/2021  PCP: Deland Pretty, MD REFERRING PROVIDER: Deland Pretty, MD   PT End of Session - 12/16/21 0907     Visit Number 26    Number of Visits 28    Date for PT Re-Evaluation 01/05/22    Authorization Time Period 11/24/21-01/05/22    PT Start Time 0901    PT Stop Time 0945    PT Time Calculation (min) 44 min    Activity Tolerance Patient tolerated treatment well    Behavior During Therapy [facility] for tasks assessed/performed                   Past Medical History:  Diagnosis Date   Abnormal uterine bleeding    on HRT   Allergy    seasonal   Arthritis    Atrial fibrillation (HCC)    Celiac disease    DDD (degenerative disc disease)    Dysrhythmia    GBS (Guillain-Barre syndrome) (HCC)    Glaucoma    Microscopic colitis    Osteoarthritis of both knees    PONV (postoperative nausea and vomiting)    SVT (supraventricular tachycardia) ([place])    Past Surgical History:  Procedure Laterality Date   APPENDECTOMY     ATRIAL FIBRILLATION ABLATION N/A 01/28/2021   Procedure: ATRIAL FIBRILLATION ABLATION;  Surgeon: Constance Haw, MD;  Location: [facility];  Service: Cardiovascular;  Laterality: N/A;   CARDIOVERSION N/A 10/30/2020   Procedure: CARDIOVERSION;  Surgeon: Jerline Pain, MD;  Location: [facility] ENDOSCOPY;  Service: Cardiovascular;  Laterality: N/A;   CARDIOVERSION N/A 03/16/2021   Procedure: CARDIOVERSION;  Surgeon: Thayer Headings, MD;  Location: [facility] ENDOSCOPY;  Service: Cardiovascular;  Laterality: N/A;   COLONOSCOPY     FOOT FRACTURE SURGERY  10/2013   TONSILLECTOMY AND ADENOIDECTOMY     TOTAL KNEE ARTHROPLASTY Left 03/22/2016   Procedure: TOTAL KNEE ARTHROPLASTY;  Surgeon: Dorna Leitz, MD;  Location: [place] [facility];  Service: Orthopedics;  Laterality: Left;   TUBAL LIGATION  1981   VAGINAL HYSTERECTOMY   04/20/05   prolapse, adenomyosis   Patient Active Problem List   Diagnosis Date Noted   Hand arthritis 11/20/2021   Acquired leg length discrepancy 11/20/2021   Abnormality of gait 08/14/2021   Left foot drop 08/14/2021   Wheelchair dependence 05/15/2021   Neurogenic bladder 04/09/2021   Atrial flutter with rapid ventricular response ([place]) 03/13/2021   Urinary retention    Chronic pain syndrome    GBS (Guillain-Barre syndrome) ([place]) 03/04/2021   Sleep disturbance    Atelectasis    Malnutrition of moderate degree 02/16/2021   Elevated white blood cell count    Status post tracheostomy ([place])    Oropharyngeal dysphagia    Acute respiratory failure with hypoxia (HCC)    GBS (Guillain Barre syndrome) ([place]) 02/06/2021   Chronic anticoagulation 02/06/2021   Hyponatremia 02/06/2021   Prolonged QT interval 02/06/2021   Generalized weakness 02/06/2021   Secondary hypercoagulable state ([place]) 11/13/2020   Primary osteoarthritis of left knee 03/22/2016   Atrial fibrillation with RVR ([place]) 12/01/2013   Paroxysmal atrial fibrillation ([place]) 11/30/2013   Hypotension 11/30/2013   Atrial flutter ([place]) 11/30/2013    REFERRING DIAG: S01.093 (ICD-10-CM) - Spinal stenosis of lumbar region with neurogenic claudication  THERAPY DIAG:  Muscle weakness (generalized)  Difficulty in walking, not elsewhere classified  GBS (Guillain-Barre  syndrome) ([place])  PERTINENT HISTORY:  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  PRECAUTIONS: resolving foot drop L>R  SUBJECTIVE: "doing well, pain low about the same"  PAIN:  Are you having pain? yes NPRS scale: 2/10 Pain location: R low back Pain orientation:  PAIN TYPE: dull  Aggravating factors:  Relieving factors: rest   PRECAUTIONS: Other: resolving foot drop L>R   WEIGHT BEARING RESTRICTIONS No   FALLS:  Has patient fallen in last 6 months? No,    LIVING ENVIRONMENT: Lives  with: lives with their family Lives in: House/apartment Stairs: Yes; External: 15 steps; on right going up Has following equipment at home: Single point cane, Walker - 2 wheeled, Wheelchair (manual), and Electronics engineer   OCCUPATION: retired   PLOF: Independent prior to GBS Aug 2022   PATIENT GOALS walk indep, car for myself, return to PLOF     OBJECTIVE:   * all objective findings taken at eval unless otherwise noted.      SCREENING FOR RED FLAGS: Bowel or bladder incontinence: No Spinal tumors: No Cauda equina syndrome: No Compression fracture: No Abdominal aneurysm: No   COGNITION:          Overall cognitive status: Within functional limits for tasks assessed                        SENSATION:          Light touch: Deficits Left distal foot decreased sensation.  Wears Left ankle inversion brace              MUSCLE LENGTH: Hamstrings: WFL     POSTURE:  Forward hip flex, asymmetry: left hip elevation, right shoulder elevation       LE AROM/PROM:   A/PROM Right 08/25/2021 Left 08/25/2021 Left 10/09/21  Hip flexion       Hip extension       Hip abduction       Hip adduction       Hip internal rotation       Hip external rotation       Knee flexion       Knee extension       Ankle dorsiflexion 5 -10 AROM: -8 in long sitting PROM: 1 in long sitting    Ankle plantarflexion full full   Ankle inversion       Ankle eversion        (Blank rows = not tested)   LE MMT:   MMT Right 08/25/2021 Left 08/25/2021 Right 10/13/21 Left 10/13/21    Hip flexion 3+ 3- 4 3+ 4+ 4-  Hip extension          Hip abduction 4 4 5- 4+ 5 5  Hip adduction 4 4 5- 5- 5 5  Hip internal rotation          Hip external rotation          Knee flexion 4 4 5- 5- 5 5  Knee extension 4 4 5- 5- 5 5  Ankle dorsiflexion 4 3 5  3+ 5 4-   Ankle plantarflexion 4+ 3 5 3+  4-  Ankle inversion          Ankle eversion      3-  3-   (Blank rows = not tested)   Straight leg raise test: Negative and  Slump test: Negative   FUNCTIONAL TESTS:  5 times sit to  stand: 21 using ue from transport chair 10/09/21: 5x STS: 16.33 sec using UE, on hot tub surround 11/24/21: 16 Timed up and go (TUG): 20s 10/13/21 TUG 16s 11/24/21 16                     6  minute walk test  554f          10/13/21 6MWT 853 ft         11/24/21 987 ft GAIT: Distance walked: 550 ft Assistive device utilized: Walker - 2 wheeled Level of assistance: CGA Comments: Forward hip flex, limited heel strike bilaterally R<L, decreased step length and cadence.       TODAY'S TREATMENT  Pt seen for aquatic therapy today.  Treatment took place in water 3.25-4.22fin depth at [facility] Temp of water was 90.  Pt entered/exited the pool via stairs step to pattern independently with bilat rail.  -Warm up of Walking forward/ backward/ side stepping x 4 laps of each for warm up -Straddling yellow noodle holding to corner wall as needed:  cycling with breast stroke arms x 6 widths; scissors (right hip pain continues "usual") - side stepping with arms abdct/ add with rainbow hand buoys on R/L  x 4 widths -High knee marching forward / backward with rainbow hand buoys at side -R QL stretch Plank with hands on bench with hip ext x 10 each LE -seated on 4th step, leaning on elbows - alternating hip abdct/ add and flutter kick - STS from 4th step, no UE support x 5  - braiding -holding yellow noodle; cues for sequence and to decrease step length for improved balance   Pt requires buoyancy for support and to offload joints with strengthening exercises. Viscosity of the water is needed for resistance of strengthening; water current perturbations provides challenge to standing balance unsupported, requiring increased core activation.    PATIENT EDUCATION:  Education details: Progression of exercise Person educated: Patient and Caregiver Education method: Explanation Education comprehension: verbalized understanding      HOME EXERCISE PROGRAM:  stair climbing 2-3 x week with assistance Walk distance to pool with each visit. HEP: gastroc and hamstring stretching; flutter and add/abd in seated 3x20, STS from 3rd step x5. Pt to sleep upstairs 1 x weekly using smaller bathroom, increase stair climbing to 4xweekly Left QL strengthening/    ASSESSMENT:   CLINICAL IMPRESSION: Minimal cues required for execution of exercise. She demonstrates good posture and core control. No increased discomfort with ex. Endurance improving as pt without fatigue after session. Goals ongoing   OBJECTIVE IMPAIRMENTS Abnormal gait, decreased activity tolerance, decreased balance, decreased endurance, decreased mobility, difficulty walking, decreased ROM, decreased strength, impaired sensation, and pain.    ACTIVITY LIMITATIONS cleaning, community activity, driving, meal prep, laundry, and shopping.    PERSONAL FACTORS Age and 3+ comorbidities:    are also affecting patient's functional outcome.      REHAB POTENTIAL: Good   CLINICAL DECISION MAKING: Evolving/moderate complexity   EVALUATION COMPLEXITY: Moderate     GOALS: Goals reviewed with patient? Yes   SHORT TERM GOALS:   STG Name Target Date Goal status  1 Pt will tolerate full sessions of aquatic therapy without reports of excessive fatigue or increase pain Baseline:  09/15/2021   Achieved 09/23/21  2 Pt will be indep and compliant with initial aquatic HEP Baseline:  09/15/2021  Achieved 09/22/21  3 Pt will tolerate amb one way (to or from) pool Baseline:  09/22/2021  Achieved  09/23/21  4 Pt will be able to walk 10 continuous minutes in pool (without rest period) Baseline: 09/22/2021  Achieved 10/01/21  LONG TERM GOALS:    LTG Name Target Date Goal status  1 Pt will be able to climb 15 steps to access her second floor of home Baseline: 10/20/2021 Achieved 10/01/21  2 Pt will be able to tolerate walking to and from pool for therapy Baseline: 10/20/2021  Achieved 10/09/21  3 Pt will improve on the 6 minute walk test ambulating > 800 ft with AD as needed Baseline: 10/20/2021 Achieved 10/13/21  4 Pt will improve on the 5 x STS test with completion in < 15s Baseline:12.59 12/04/21 10/20/2021 Achieved 12/04/21  5 Pt will improve on the TUG test form 20s to <or= 17s Baseline: 10/20/2021 Achieved 10/13/21       6 Pt will climb 1 flight of steps indep and 7x weekly to demonstrate adequate strength to return sleeping in master bed Baseline: 3-4 x weekly 12/08/21 Achieved  7 Pt will amb > 1000 ft continuously gaining community ambulation ability. Baseline: 987 ft 12/08/21 Ongoing  8 Pt will report return of 90 to 95% of PLOF Baseline: 80-85% 12/08/21 Ongoing 85% 11/24/21  9 Pt will ambulate with a cane safely and indep. Baseline: rollator 12/08/21 Achieved    PLAN: PT FREQUENCY: 1-2x/week   PT DURATION: 8 weeks   PLANNED INTERVENTIONS: Peroneus Brevis strengthening. Therapeutic exercises, Therapeutic activity, Neuro Muscular re-education, Balance training, Gait training, Patient/Family education, Joint mobilization, Stair training, Aquatic Therapy, Taping, and Manual therapy   PLAN FOR NEXT [place] dc planning. 2 more visits.  Ensure indep with HEP  Stanton Kidney Tharon Aquas) Maddalyn Lutze MPT  12/16/21 12:33 PM

## 2021-12-24 ENCOUNTER — Encounter (HOSPITAL_BASED_OUTPATIENT_CLINIC_OR_DEPARTMENT_OTHER): Payer: Self-pay | Admitting: Physical Therapy

## 2021-12-24 ENCOUNTER — Ambulatory Visit (HOSPITAL_BASED_OUTPATIENT_CLINIC_OR_DEPARTMENT_OTHER): Payer: Medicare Other | Admitting: Physical Therapy

## 2021-12-24 DIAGNOSIS — M6281 Muscle weakness (generalized): Secondary | ICD-10-CM

## 2021-12-24 DIAGNOSIS — G610 Guillain-Barre syndrome: Secondary | ICD-10-CM

## 2021-12-24 DIAGNOSIS — R262 Difficulty in walking, not elsewhere classified: Secondary | ICD-10-CM | POA: Diagnosis not present

## 2021-12-24 NOTE — Therapy (Signed)
OUTPATIENT PHYSICAL THERAPY TREATMENT NOTE         Patient Name: Angela Morales MRN: [id number] DOB:1945-07-03, 76 y.o., female Today's Date: 12/24/2021  PCP: Deland Pretty, MD REFERRING PROVIDER: Deland Pretty, MD   PT End of Session - 12/24/21 1107     Visit Number 27    Number of Visits 28    Date for PT Re-Evaluation 01/05/22    Authorization Time Period 11/24/21-01/05/22    Progress Note Due on Visit 75    PT Start Time 1102    PT Stop Time 1142    PT Time Calculation (min) 40 min    Behavior During Therapy WFL for tasks assessed/performed                   Past Medical History:  Diagnosis Date   Abnormal uterine bleeding    on HRT   Allergy    seasonal   Arthritis    Atrial fibrillation (HCC)    Celiac disease    DDD (degenerative disc disease)    Dysrhythmia    GBS (Guillain-Barre syndrome) (HCC)    Glaucoma    Microscopic colitis    Osteoarthritis of both knees    PONV (postoperative nausea and vomiting)    SVT (supraventricular tachycardia) ([place])    Past Surgical History:  Procedure Laterality Date   APPENDECTOMY     ATRIAL FIBRILLATION ABLATION N/A 01/28/2021   Procedure: ATRIAL FIBRILLATION ABLATION;  Surgeon: Constance Haw, MD;  Location: [place] [facility];  Service: Cardiovascular;  Laterality: N/A;   CARDIOVERSION N/A 10/30/2020   Procedure: CARDIOVERSION;  Surgeon: Jerline Pain, MD;  Location: [facility] ENDOSCOPY;  Service: Cardiovascular;  Laterality: N/A;   CARDIOVERSION N/A 03/16/2021   Procedure: CARDIOVERSION;  Surgeon: Thayer Headings, MD;  Location: [facility] ENDOSCOPY;  Service: Cardiovascular;  Laterality: N/A;   COLONOSCOPY     FOOT FRACTURE SURGERY  10/2013   TONSILLECTOMY AND ADENOIDECTOMY     TOTAL KNEE ARTHROPLASTY Left 03/22/2016   Procedure: TOTAL KNEE ARTHROPLASTY;  Surgeon: Dorna Leitz, MD;  Location: [place];  Service: Orthopedics;  Laterality: Left;   TUBAL LIGATION  1981   VAGINAL HYSTERECTOMY  04/20/05   prolapse,  adenomyosis   Patient Active Problem List   Diagnosis Date Noted   Hand arthritis 11/20/2021   Acquired leg length discrepancy 11/20/2021   Abnormality of gait 08/14/2021   Left foot drop 08/14/2021   Wheelchair dependence 05/15/2021   Neurogenic bladder 04/09/2021   Atrial flutter with rapid ventricular response ([place]) 03/13/2021   Urinary retention    Chronic pain syndrome    GBS (Guillain-Barre syndrome) ([place]) 03/04/2021   Sleep disturbance    Atelectasis    Malnutrition of moderate degree 02/16/2021   Elevated white blood cell count    Status post tracheostomy ([place])    Oropharyngeal dysphagia    Acute respiratory failure with hypoxia (HCC)    GBS (Guillain Barre syndrome) ([place]) 02/06/2021   Chronic anticoagulation 02/06/2021   Hyponatremia 02/06/2021   Prolonged QT interval 02/06/2021   Generalized weakness 02/06/2021   Secondary hypercoagulable state ([place]) 11/13/2020   Primary osteoarthritis of left knee 03/22/2016   Atrial fibrillation with RVR ([place]) 12/01/2013   Paroxysmal atrial fibrillation ([place]) 11/30/2013   Hypotension 11/30/2013   Atrial flutter ([place]) 11/30/2013    REFERRING DIAG: L93.790 (ICD-10-CM) - Spinal stenosis of lumbar region with neurogenic claudication  THERAPY DIAG:  Muscle weakness (generalized)  Difficulty in walking, not elsewhere classified  GBS (Guillain-Barre  syndrome) ([place])  PERTINENT HISTORY:  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  PRECAUTIONS: resolving foot drop L>R  SUBJECTIVE: Pt reports her back is sore from pruning in garden. "I'd like to know how to not hurt myself"  PAIN:  Are you having pain? yes NPRS scale: 3/10 Pain location: low back Pain orientation:  PAIN TYPE: dull  Aggravating factors:  Relieving factors: rest   PRECAUTIONS: Other: resolving foot drop L>R   WEIGHT BEARING RESTRICTIONS No   FALLS:  Has patient fallen in last 6 months? No,     LIVING ENVIRONMENT: Lives with: lives with their family Lives in: House/apartment Stairs: Yes; External: 15 steps; on right going up Has following equipment at home: Single point cane, Walker - 2 wheeled, Wheelchair (manual), and Electronics engineer   OCCUPATION: retired   PLOF: Independent prior to GBS Aug 2022   PATIENT GOALS walk indep, car for myself, return to PLOF     OBJECTIVE:   * all objective findings taken at eval unless otherwise noted.      SCREENING FOR RED FLAGS: Bowel or bladder incontinence: No Spinal tumors: No Cauda equina syndrome: No Compression fracture: No Abdominal aneurysm: No   COGNITION:          Overall cognitive status: Within functional limits for tasks assessed                        SENSATION:          Light touch: Deficits Left distal foot decreased sensation.  Wears Left ankle inversion brace              MUSCLE LENGTH: Hamstrings: WFL     POSTURE:  Forward hip flex, asymmetry: left hip elevation, right shoulder elevation       LE AROM/PROM:   A/PROM Right 08/25/2021 Left 08/25/2021 Left 10/09/21  Hip flexion       Hip extension       Hip abduction       Hip adduction       Hip internal rotation       Hip external rotation       Knee flexion       Knee extension       Ankle dorsiflexion 5 -10 AROM: -8 in long sitting PROM: 1 in long sitting    Ankle plantarflexion full full   Ankle inversion       Ankle eversion        (Blank rows = not tested)   LE MMT:   MMT Right 08/25/2021 Left 08/25/2021 Right 10/13/21 Left 10/13/21    Hip flexion 3+ 3- 4 3+ 4+ 4-  Hip extension          Hip abduction 4 4 5- 4+ 5 5  Hip adduction 4 4 5- 5- 5 5  Hip internal rotation          Hip external rotation          Knee flexion 4 4 5- 5- 5 5  Knee extension 4 4 5- 5- 5 5  Ankle dorsiflexion 4 3 5  3+ 5 4-   Ankle plantarflexion 4+ 3 5 3+  4-  Ankle inversion          Ankle eversion      3-  3-   (Blank rows = not tested)   Straight  leg raise test: Negative and Slump  test: Negative   FUNCTIONAL TESTS:  5 times sit to stand: 21 using ue from transport chair 10/09/21: 5x STS: 16.33 sec using UE, on hot tub surround 11/24/21: 16 Timed up and go (TUG): 20s 10/13/21 TUG 16s 11/24/21 16                     6  minute walk test  554f          10/13/21 6MWT 853 ft         11/24/21 987 ft GAIT: Distance walked: 550 ft Assistive device utilized: Walker - 2 wheeled Level of assistance: CGA Comments: Forward hip flex, limited heel strike bilaterally R<L, decreased step length and cadence.       TODAY'S TREATMENT  Pt seen for aquatic therapy today.  Treatment took place in water 3.25-4.289fin [facility] Temp of water was 90.  Pt entered/exited the pool via stairs step to pattern independently with bilat rail.  Warm up of Walking forward/ backward/ side stepping (with arm abdct /add) x 4 laps of each for warm up Holding 11.5# ball on kickboard walking forward with high knees, then just holding ball submerged in water with outstretched arms (simulating holding a laundry basket in front of her) Back against wall with Rt side stretch  Staggered stance with kick board push /pull at 10, 12, and 2 o'clock - repeated each leg forward Straddling yellow noodle holding to corner wall as needed:  cycling with breast stroke arms x 6 widths; scissors; cc ski  Plank with hands on bench with hip ext to opp arm/leg Seated on bench, blue noodle pull down to thighs seated on 4th step, leaning on elbows - alternating hip abdct/ add and flutter kick- multiple sets Wood chop using yellow small ball x 5 each side; trial 2 reps with rainbow hand buoys SLS without support at 4 ft  Pt requires buoyancy for support and to offload joints with strengthening exercises. Viscosity of the water is needed for resistance of strengthening; water current perturbations provides challenge to standing balance unsupported, requiring  increased core activation.    PATIENT EDUCATION:  Education details: Progression of exercise Person educated: Patient and Caregiver Education method: Explanation Education comprehension: verbalized understanding     HOME EXERCISE PROGRAM:  stair climbing 2-3 x week with assistance Walk distance to pool with each visit. HEP: gastroc and hamstring stretching; flutter and add/abd in seated 3x20, STS from 3rd step x5. Pt to sleep upstairs 1 x weekly using smaller bathroom, increase stair climbing to 4xweekly Left QL strengthening/    ASSESSMENT:   CLINICAL IMPRESSION: Introduced exercises with trunk rotation and reach to assist with endurance with ADLs. Staggered stance position is challenging.  Encouraged pt to trial these when she is working on [employer] pool on own.  She reported no increase in back pain during session, but some increase in hand pain with gripping of kickboard or hand buoys.  Plan to d/c after next visit, per discussion between pt and supervising PT.    OBJECTIVE IMPAIRMENTS Abnormal gait, decreased activity tolerance, decreased balance, decreased endurance, decreased mobility, difficulty walking, decreased ROM, decreased strength, impaired sensation, and pain.    ACTIVITY LIMITATIONS cleaning, community activity, driving, meal prep, laundry, and shopping.    PERSONAL FACTORS Age and 3+ comorbidities:    are also affecting patient's functional outcome.      REHAB POTENTIAL: Good   CLINICAL DECISION MAKING: Evolving/moderate complexity   EVALUATION COMPLEXITY:  Moderate     GOALS: Goals reviewed with patient? Yes   SHORT TERM GOALS:   STG Name Target Date Goal status  1 Pt will tolerate full sessions of aquatic therapy without reports of excessive fatigue or increase pain Baseline:  09/15/2021   Achieved 09/23/21  2 Pt will be indep and compliant with initial aquatic HEP Baseline:  09/15/2021  Achieved 09/22/21  3 Pt will tolerate amb one way (to or from)  pool Baseline:  09/22/2021  Achieved 09/23/21  4 Pt will be able to walk 10 continuous minutes in pool (without rest period) Baseline: 09/22/2021  Achieved 10/01/21  LONG TERM GOALS:    LTG Name Target Date Goal status  1 Pt will be able to climb 15 steps to access her second floor of home Baseline: 10/20/2021 Achieved 10/01/21  2 Pt will be able to tolerate walking to and from pool for therapy Baseline: 10/20/2021 Achieved 10/09/21  3 Pt will improve on the 6 minute walk test ambulating > 800 ft with AD as needed Baseline: 10/20/2021 Achieved 10/13/21  4 Pt will improve on the 5 x STS test with completion in < 15s Baseline:12.59 12/04/21 10/20/2021 Achieved 12/04/21  5 Pt will improve on the TUG test form 20s to <or= 17s Baseline: 10/20/2021 Achieved 10/13/21       6 Pt will climb 1 flight of steps indep and 7x weekly to demonstrate adequate strength to return sleeping in master bed Baseline: 3-4 x weekly 12/08/21 Achieved  7 Pt will amb > 1000 ft continuously gaining community ambulation ability. Baseline: 987 ft 12/08/21 Ongoing  8 Pt will report return of 90 to 95% of PLOF Baseline: 80-85% 12/08/21 Ongoing 85% 11/24/21  9 Pt will ambulate with a cane safely and indep. Baseline: rollator 12/08/21 Achieved    PLAN: PT FREQUENCY: 1-2x/week   PT DURATION: 8 weeks   PLANNED INTERVENTIONS: Peroneus Brevis strengthening. Therapeutic exercises, Therapeutic activity, Neuro Muscular re-education, Balance training, Gait training, Patient/Family education, Joint mobilization, Stair training, Aquatic Therapy, Taping, and Manual therapy   PLAN FOR NEXT SESSION:1 last visit; assess remaining goals.  Ensure indep with HEP  Kerin Perna, PTA 12/24/21 11:46 AM

## 2021-12-31 ENCOUNTER — Ambulatory Visit (HOSPITAL_BASED_OUTPATIENT_CLINIC_OR_DEPARTMENT_OTHER): Payer: Medicare Other | Attending: Physical Medicine and Rehabilitation | Admitting: Physical Therapy

## 2021-12-31 ENCOUNTER — Encounter (HOSPITAL_BASED_OUTPATIENT_CLINIC_OR_DEPARTMENT_OTHER): Payer: Self-pay | Admitting: Physical Therapy

## 2021-12-31 DIAGNOSIS — G610 Guillain-Barre syndrome: Secondary | ICD-10-CM | POA: Insufficient documentation

## 2021-12-31 DIAGNOSIS — R262 Difficulty in walking, not elsewhere classified: Secondary | ICD-10-CM | POA: Diagnosis not present

## 2021-12-31 DIAGNOSIS — M6281 Muscle weakness (generalized): Secondary | ICD-10-CM | POA: Insufficient documentation

## 2021-12-31 NOTE — Therapy (Addendum)
OUTPATIENT PHYSICAL THERAPY TREATMENT NOTE      PHYSICAL THERAPY DISCHARGE SUMMARY  Visits from Start of Care: 28  Current functional level related to goals / functional outcomes: Pt is safe and indep in all functional mobilty and ADL's with AD's as needed   Remaining deficits: Slight left foot drop/weakness   Education / Equipment: Management of condition; HEP   Patient agrees to discharge. Patient goals were met. Patient is being discharged due to meeting the stated rehab goals.    Patient Name: Angela Morales MRN: [id number] DOB:08/27/1945, 76 y.o., female Today's Date: 12/31/2021  PCP: Deland Pretty, MD REFERRING PROVIDER: Deland Pretty, MD   PT End of Session - 12/31/21 0854     Visit Number 28    Number of Visits 28    Date for PT Re-Evaluation 01/05/22    Authorization Time Period 11/24/21-01/05/22    Progress Note Due on Visit 30    Behavior During Therapy [facility] for tasks assessed/performed                   Past Medical History:  Diagnosis Date   Abnormal uterine bleeding    on HRT   Allergy    seasonal   Arthritis    Atrial fibrillation (HCC)    Celiac disease    DDD (degenerative disc disease)    Dysrhythmia    GBS (Guillain-Barre syndrome) (HCC)    Glaucoma    Microscopic colitis    Osteoarthritis of both knees    PONV (postoperative nausea and vomiting)    SVT (supraventricular tachycardia) ([place])    Past Surgical History:  Procedure Laterality Date   APPENDECTOMY     ATRIAL FIBRILLATION ABLATION N/A 01/28/2021   Procedure: ATRIAL FIBRILLATION ABLATION;  Surgeon: Constance Haw, MD;  Location: [place] [facility];  Service: Cardiovascular;  Laterality: N/A;   CARDIOVERSION N/A 10/30/2020   Procedure: CARDIOVERSION;  Surgeon: Jerline Pain, MD;  Location: [facility] ENDOSCOPY;  Service: Cardiovascular;  Laterality: N/A;   CARDIOVERSION N/A 03/16/2021   Procedure: CARDIOVERSION;  Surgeon: Thayer Headings, MD;  Location: [facility] ENDOSCOPY;   Service: Cardiovascular;  Laterality: N/A;   COLONOSCOPY     FOOT FRACTURE SURGERY  10/2013   TONSILLECTOMY AND ADENOIDECTOMY     TOTAL KNEE ARTHROPLASTY Left 03/22/2016   Procedure: TOTAL KNEE ARTHROPLASTY;  Surgeon: Dorna Leitz, MD;  Location: [place];  Service: Orthopedics;  Laterality: Left;   TUBAL LIGATION  1981   VAGINAL HYSTERECTOMY  04/20/05   prolapse, adenomyosis   Patient Active Problem List   Diagnosis Date Noted   Hand arthritis 11/20/2021   Acquired leg length discrepancy 11/20/2021   Abnormality of gait 08/14/2021   Left foot drop 08/14/2021   Wheelchair dependence 05/15/2021   Neurogenic bladder 04/09/2021   Atrial flutter with rapid ventricular response ([place]) 03/13/2021   Urinary retention    Chronic pain syndrome    GBS (Guillain-Barre syndrome) ([place]) 03/04/2021   Sleep disturbance    Atelectasis    Malnutrition of moderate degree 02/16/2021   Elevated white blood cell count    Status post tracheostomy ([place])    Oropharyngeal dysphagia    Acute respiratory failure with hypoxia (HCC)    GBS (Guillain Barre syndrome) ([place]) 02/06/2021   Chronic anticoagulation 02/06/2021   Hyponatremia 02/06/2021   Prolonged QT interval 02/06/2021   Generalized weakness 02/06/2021   Secondary hypercoagulable state ([place]) 11/13/2020   Primary osteoarthritis of left knee 03/22/2016   Atrial fibrillation with RVR ([place])  12/01/2013   Paroxysmal atrial fibrillation ([place]) 11/30/2013   Hypotension 11/30/2013   Atrial flutter ([place]) 11/30/2013    REFERRING DIAG: Q59.563 (ICD-10-CM) - Spinal stenosis of lumbar region with neurogenic claudication  THERAPY DIAG:  Muscle weakness (generalized)  Difficulty in walking, not elsewhere classified  GBS (Guillain-Barre syndrome) (HCC)  PERTINENT HISTORY:  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  PRECAUTIONS: resolving foot drop L>R  SUBJECTIVE: pt reports she was a little  sore in her inner thighs after last session.  She came to the pool 2x since then.  Her aide, Angela Morales, has been released.   She arrives with cane and rollator by herself.   PAIN:  Are you having pain? yes NPRS scale: 2/10 Pain location: hands Pain orientation: bilat PAIN TYPE: sore  Aggravating factors:  Relieving factors: rest   PRECAUTIONS: Other: resolving foot drop L>R   WEIGHT BEARING RESTRICTIONS No   FALLS:  Has patient fallen in last 6 months? No,    LIVING ENVIRONMENT: Lives with: lives with their family Lives in: House/apartment Stairs: Yes; External: 15 steps; on right going up Has following equipment at home: Single point cane, Walker - 2 wheeled, Wheelchair (manual), and Electronics engineer   OCCUPATION: retired   PLOF: Independent prior to GBS Aug 2022   PATIENT GOALS walk indep, car for myself, return to PLOF     OBJECTIVE:   * all objective findings taken at eval unless otherwise noted.      SCREENING FOR RED FLAGS: Bowel or bladder incontinence: No Spinal tumors: No Cauda equina syndrome: No Compression fracture: No Abdominal aneurysm: No   COGNITION:          Overall cognitive status: Within functional limits for tasks assessed                        SENSATION:          Light touch: Deficits Left distal foot decreased sensation.  Wears Left ankle inversion brace              MUSCLE LENGTH: Hamstrings: WFL     POSTURE:  Forward hip flex, asymmetry: left hip elevation, right shoulder elevation       LE AROM/PROM:   A/PROM Right 08/25/2021 Left 08/25/2021 Left 10/09/21  Hip flexion       Hip extension       Hip abduction       Hip adduction       Hip internal rotation       Hip external rotation       Knee flexion       Knee extension       Ankle dorsiflexion 5 -10 AROM: -8 in long sitting PROM: 1 in long sitting    Ankle plantarflexion full full   Ankle inversion       Ankle eversion        (Blank rows = not tested)   LE MMT:    MMT Right 08/25/2021 Left 08/25/2021 Right 10/13/21 Left 10/13/21    Hip flexion 3+ 3- 4 3+ 4+ 4-  Hip extension          Hip abduction 4 4 5- 4+ 5 5  Hip adduction 4 4 5- 5- 5 5  Hip internal rotation          Hip external rotation          Knee flexion  4 4 5- 5- 5 5  Knee extension 4 4 5- 5- 5 5  Ankle dorsiflexion _0 3+ 5 4-   Ankle plantarflexion 4+ 3 5 3+  4-  Ankle inversion          Ankle eversion      3-  3-   (Blank rows = not tested)   Straight leg raise test: Negative and Slump test: Negative   FUNCTIONAL TESTS:  5 times sit to stand: 21 using ue from transport chair 10/09/21: 5x STS: 16.33 sec using UE, on hot tub surround 11/24/21: 16 Timed up and go (TUG): 20s 10/13/21 TUG 16s 5/30/_1 minute walk test  54f          10/13/21 6MWT 853 ft         11/24/21 987 ft   GAIT: Distance walked: 550 ft Assistive device utilized: Walker - 2 wheeled Level of assistance: CGA Comments: Forward hip flex, limited heel strike bilaterally R<L, decreased step length and cadence.    12/31/21: 1001 ft continuous, SPC, supervision. Cues for slowing speed when becoming fatigued (2 to unsteadiness), cues for upright posture   TODAY'S TREATMENT  Gait as seen above prior to entering pool.  Pt seen for aquatic therapy today.  Treatment took place in water 3.25-4.256fin depth [facility] Temp of water was 90.  Pt entered/exited the pool via stairs step to pattern independently with bilat rail.  Warm up of Walking forward/ backward/ side stepping (with arm abdct /add) Monster walk forward/ backward  Stork stance with hip IR/ER, intermittent UE on wall Plank on bench with opp arm/leg lift  Back against wall with Rt side stretch  Wood chop using yellow small ball x 5 each side seated on 4th step, leaning on elbows - alternating hip abdct/ add, double knee to chest, and flutter kick- multiple sets Seated on 4th step, STS x 5 Staggered  stance with kick board push /pull at 10, 12, and 2 o'clock - repeated each leg forward Blue noodle pull down to thighs with core engaged x 20 Hamstring stretch with foot on 2nd step x 30s x 2 each LE Bilat calf stretch with heels of edge of step  Pt requires buoyancy for support and to offload joints with strengthening exercises. Viscosity of the water is needed for resistance of strengthening; water current perturbations provides challenge to standing balance unsupported, requiring increased core activation.    PATIENT EDUCATION:  Education details: Progression of exercise Person educated: Patient and Caregiver Education method: Explanation Education comprehension: verbalized understanding     HOME EXERCISE PROGRAM:  stair climbing 2-3 x week with railing/ cane HEP: gastroc and hamstring stretching; flutter and add/abd in seated 3x20, STS from 3rd step x5. Rt QL stretch    ASSESSMENT:   CLINICAL IMPRESSION: Reviewed current aquatic exercises, including new ones from last session.  She reported no increase in back pain during session, but some increase in hand pain with gripping of kickboard or hand buoys. She has met all goals and verbalizes readiness to d/c to HEP.     OBJECTIVE IMPAIRMENTS Abnormal gait, decreased activity tolerance, decreased balance, decreased endurance, decreased mobility, difficulty walking, decreased ROM, decreased strength, impaired sensation, and pain.    ACTIVITY LIMITATIONS cleaning, community activity, driving, meal prep, laundry, and shopping.    PERSONAL FACTORS Age and  3+ comorbidities:    are also affecting patient's functional outcome.      REHAB POTENTIAL: Good   CLINICAL DECISION MAKING: Evolving/moderate complexity   EVALUATION COMPLEXITY: Moderate     GOALS: Goals reviewed with patient? Yes   SHORT TERM GOALS:   STG Name Target Date Goal status  1 Pt will tolerate full sessions of aquatic therapy without reports of excessive  fatigue or increase pain Baseline:  09/15/2021   Achieved 09/23/21  2 Pt will be indep and compliant with initial aquatic HEP Baseline:  09/15/2021  Achieved 09/22/21  3 Pt will tolerate amb one way (to or from) pool Baseline:  09/22/2021  Achieved 09/23/21  4 Pt will be able to walk 10 continuous minutes in pool (without rest period) Baseline: 09/22/2021  Achieved 10/01/21  LONG TERM GOALS:    LTG Name Target Date Goal status  1 Pt will be able to climb 15 steps to access her second floor of home Baseline: 10/20/2021 Achieved 10/01/21  2 Pt will be able to tolerate walking to and from pool for therapy Baseline: 10/20/2021 Achieved 10/09/21  3 Pt will improve on the 6 minute walk test ambulating > 800 ft with AD as needed Baseline: 10/20/2021 Achieved 10/13/21  4 Pt will improve on the 5 x STS test with completion in < 15s Baseline:12.59 12/04/21 10/20/2021 Achieved 12/04/21  5 Pt will improve on the TUG test form 20s to <or= 17s Baseline: 10/20/2021 Achieved 10/13/21       6 Pt will climb 1 flight of steps indep and 7x weekly to demonstrate adequate strength to return sleeping in master bed Baseline: 3-4 x weekly 12/08/21 Achieved  7 Pt will amb > 1000 ft continuously gaining community ambulation ability. Baseline:  12/08/21 Achieved  12/31/21  8 Pt will report return of 90 to 95% of PLOF Baseline:  12/08/21 Achieved  7/6/2  9 Pt will ambulate with a cane safely and indep. Baseline: rollator 12/08/21 Achieved    PLAN: PT FREQUENCY: 1-2x/week   PT DURATION: 8 weeks   PLANNED INTERVENTIONS: Peroneus Brevis strengthening. Therapeutic exercises, Therapeutic activity, Neuro Muscular re-education, Balance training, Gait training, Patient/Family education, Joint mobilization, Stair training, Aquatic Therapy, Taping, and Manual therapy   PLAN FOR NEXT SESSION:  -  d/c today.    Kerin Perna, PTA 12/31/21 9:32 AM  Therapist reviewed plan with PTA and agrees with plan going forward.  Patient Dc'ed  Angela Morales) Angela Morales MPT Addended 01/01/22 454p

## 2022-01-04 ENCOUNTER — Ambulatory Visit (HOSPITAL_BASED_OUTPATIENT_CLINIC_OR_DEPARTMENT_OTHER): Payer: Medicare Other | Admitting: Physical Therapy

## 2022-01-05 DIAGNOSIS — G610 Guillain-Barre syndrome: Secondary | ICD-10-CM | POA: Diagnosis not present

## 2022-01-25 ENCOUNTER — Other Ambulatory Visit: Payer: Self-pay | Admitting: Cardiology

## 2022-01-27 DIAGNOSIS — H401131 Primary open-angle glaucoma, bilateral, mild stage: Secondary | ICD-10-CM | POA: Diagnosis not present

## 2022-02-04 ENCOUNTER — Ambulatory Visit: Payer: Medicare Other | Admitting: Cardiovascular Disease

## 2022-02-04 ENCOUNTER — Encounter: Payer: Self-pay | Admitting: Cardiovascular Disease

## 2022-02-04 VITALS — BP 106/64 | HR 73 | Ht 63.0 in | Wt 139.4 lb

## 2022-02-04 DIAGNOSIS — D6869 Other thrombophilia: Secondary | ICD-10-CM

## 2022-02-04 DIAGNOSIS — Z79899 Other long term (current) drug therapy: Secondary | ICD-10-CM

## 2022-02-04 DIAGNOSIS — I480 Paroxysmal atrial fibrillation: Secondary | ICD-10-CM | POA: Diagnosis not present

## 2022-02-04 DIAGNOSIS — E039 Hypothyroidism, unspecified: Secondary | ICD-10-CM

## 2022-02-04 DIAGNOSIS — Z5181 Encounter for therapeutic drug level monitoring: Secondary | ICD-10-CM

## 2022-02-04 DIAGNOSIS — I4819 Other persistent atrial fibrillation: Secondary | ICD-10-CM | POA: Diagnosis not present

## 2022-02-04 LAB — COMPREHENSIVE METABOLIC PANEL
ALT: 12 IU/L (ref 0–32)
AST: 19 IU/L (ref 0–40)
Albumin/Globulin Ratio: 1.3 (ref 1.2–2.2)
Albumin: 4.2 g/dL (ref 3.8–4.8)
Alkaline Phosphatase: 64 IU/L (ref 44–121)
BUN/Creatinine Ratio: 16 (ref 12–28)
BUN: 12 mg/dL (ref 8–27)
Bilirubin Total: 0.3 mg/dL (ref 0.0–1.2)
CO2: 27 mmol/L (ref 20–29)
Calcium: 9.5 mg/dL (ref 8.7–10.3)
Chloride: 100 mmol/L (ref 96–106)
Creatinine, Ser: 0.73 mg/dL (ref 0.57–1.00)
Globulin, Total: 3.2 g/dL (ref 1.5–4.5)
Glucose: 92 mg/dL (ref 70–99)
Potassium: 5 mmol/L (ref 3.5–5.2)
Sodium: 141 mmol/L (ref 134–144)
Total Protein: 7.4 g/dL (ref 6.0–8.5)
eGFR: 85 mL/min/{1.73_m2} (ref >59)

## 2022-02-04 LAB — TSH: TSH: 3.65 u[IU]/mL (ref 0.450–4.500)

## 2022-02-04 NOTE — Patient Instructions (Addendum)
Medication Instructions:  STOP the Amiodarone  *If you need a refill on your cardiac medications before your next appointment, please call your pharmacy*   Lab Work: None ordered If you have labs (blood work) drawn today and your tests are completely normal, you will receive your results only by: [place] (if you have MyChart) OR A paper copy in the mail If you have any lab test that is abnormal or we need to change your treatment, we will call you to review the results.   Testing/Procedures: None ordered   Follow-Up: At [facility], you and your health needs are our priority.  As part of our continuing mission to provide you with exceptional heart care, we have created designated Provider Care Teams.  These Care Teams include your primary Cardiologist (physician) and Advanced Practice Providers (APPs -  Physician Assistants and Nurse Practitioners) who all work together to provide you with the care you need, when you need it.  We recommend signing up for the patient portal called "MyChart".  Sign up information is provided on this After Visit Summary.  MyChart is used to connect with patients for Virtual Visits (Telemedicine).  Patients are able to view lab/test results, encounter notes, upcoming appointments, etc.  Non-urgent messages can be sent to your provider as well.   To learn more about what you can do with MyChart, go to [email or web address].    Your next appointment:   12 month(s)  The format for your next appointment:   In Person  Provider:   Sanda Klein, MD {   Important Information About Sugar

## 2022-02-04 NOTE — Progress Notes (Signed)
Cardiology Office Note    Date:  02/04/2022   ID:  BREINDY MEADOW, DOB 1945/11/02, MRN [id number]  PCP:  Deland Pretty, MD  Cardiologist:   Sanda Klein, MD   No chief complaint on file.    History of Present Illness:  Angela Morales is a 76 y.o. female with what appears to be "lone" atrial fibrillation.  She is a retired Designer, jewellery.  She had many years of paroxysmal atrial fibrillation, but subsequently developed persistent atrial fibrillation in 2023 which was highly symptomatic.  She underwent a successful ablation in early August 2023 with Dr. Curt Bears, but just a week later was hospitalized for Guillain-Barr syndrome.  We have took a long time but she is now walking independently, using a cane only when she is anticipates walking over uneven surfaces.  She has not regained normal sphincter tone control.  She has not had any falls.  She denies any bleeding problems.  At her last appointment Dr. Curt Bears and generally the dose of amiodarone was decreased to 100 mg daily.  She has not had any clinically apparent episodes of atrial fibrillation or any palpitations since then.  She denies angina or dyspnea at rest or with activity and does not have lower extremity edema, claudication, focal neurological complaints, dizziness or syncope.  No bleeding problems.  Her first detected episode of atrial fibrillation was related to an acute diarrheal illness, eventually proven to the true celiac disease.  Since then her GI symptoms have improved substantially on a gluten-free diet.  Previous workup has demonstrated the absence of significant structural cardiac abnormalities. Both the right and left atrium were described as being normal in size by echocardiography. There are no valvular problems.  the rhythm is sinus with PACs.  Past Medical History:  Diagnosis Date   Abnormal uterine bleeding    on HRT   Allergy    seasonal   Arthritis    Atrial fibrillation (HCC)    Celiac  disease    DDD (degenerative disc disease)    Dysrhythmia    GBS (Guillain-Barre syndrome) (HCC)    Glaucoma    Microscopic colitis    Osteoarthritis of both knees    PONV (postoperative nausea and vomiting)    SVT (supraventricular tachycardia) ([place])     Past Surgical History:  Procedure Laterality Date   APPENDECTOMY     ATRIAL FIBRILLATION ABLATION N/A 01/28/2021   Procedure: ATRIAL FIBRILLATION ABLATION;  Surgeon: Constance Haw, MD;  Location: [facility];  Service: Cardiovascular;  Laterality: N/A;   CARDIOVERSION N/A 10/30/2020   Procedure: CARDIOVERSION;  Surgeon: Jerline Pain, MD;  Location: [facility] ENDOSCOPY;  Service: Cardiovascular;  Laterality: N/A;   CARDIOVERSION N/A 03/16/2021   Procedure: CARDIOVERSION;  Surgeon: Thayer Headings, MD;  Location: [facility] ENDOSCOPY;  Service: Cardiovascular;  Laterality: N/A;   COLONOSCOPY     FOOT FRACTURE SURGERY  10/2013   TONSILLECTOMY AND ADENOIDECTOMY     TOTAL KNEE ARTHROPLASTY Left 03/22/2016   Procedure: TOTAL KNEE ARTHROPLASTY;  Surgeon: Dorna Leitz, MD;  Location: [facility];  Service: Orthopedics;  Laterality: Left;   TUBAL LIGATION  1981   VAGINAL HYSTERECTOMY  04/20/05   prolapse, adenomyosis    Current Medications: Outpatient Medications Prior to Visit  Medication Sig Dispense Refill   ELIQUIS 5 MG TABS tablet TAKE 1 TABLET BY MOUTH TWICE A DAY 180 tablet 1   gabapentin (NEURONTIN) 300 MG capsule TAKE 1 CAPSULE BY MOUTH EVERYDAY AT BEDTIME 30 capsule  5   levothyroxine (SYNTHROID) 50 MCG tablet Take 50 mcg by mouth daily before breakfast.     polyethylene glycol (MIRALAX / GLYCOLAX) 17 g packet Take 17 g by mouth daily as needed. (Patient taking differently: Take 17 g by mouth daily as needed (constipation).) 14 each 0   polyvinyl alcohol (LIQUIFILM TEARS) 1.4 % ophthalmic solution Place 1 drop into both eyes as needed for dry eyes. 15 mL 0   potassium chloride SA (KLOR-CON M) 20 MEQ tablet TAKE 2 TABLETS (60 MEQ TOTAL) BY  MOUTH DAILY     timolol (TIMOPTIC) 0.5 % ophthalmic solution Place 1 drop into the left eye 2 (two) times daily. 10 mL    ZIOPTAN 0.0015 % SOLN Place 1 drop into the left eye at bedtime.     amiodarone (PACERONE) 200 MG tablet TAKE 1/2 TABLET BY MOUTH DAILY 45 tablet 1   methocarbamol (ROBAXIN) 500 MG tablet Take 1 tablet (500 mg total) by mouth 4 (four) times daily as needed. (Patient not taking: Reported on 02/04/2022) 120 tablet 1   No facility-administered medications prior to visit.     Allergies:   Gluten meal, Lactose intolerance (gi), and Cefdinir   Social History   Socioeconomic History   Marital status: Married    Spouse name: [name]   Number of children: 3   Years of education: Not on file   Highest education level: Not on file  Occupational History   Occupation: retired  Tobacco Use   Smoking status: Never   Smokeless tobacco: Never  Vaping Use   Vaping Use: Never used  Substance and Sexual Activity   Alcohol use: Yes    Alcohol/week: 4.0 - 6.0 standard drinks of alcohol    Types: 4 - 6 Glasses of wine per week    Comment: occasional   Drug use: No   Sexual activity: Yes    Partners: Male    Birth control/protection: Surgical    Comment: TVH  Other Topics Concern   Not on file  Social History Narrative   Lives with husband and son   Left handed   Caffeine: 1 cup of coffee a day   Social Determinants of Radio broadcast assistant Strain: Not on file  Food Insecurity: Not on file  Transportation Needs: Not on file  Physical Activity: Not on file  Stress: Not on file  Social Connections: Not on file     Family History:  The patient's family history includes Bipolar disorder in her son; Breast cancer in her cousin; Heart disease (age of onset: 76) in her brother; Liver disease in her son; Osteoarthritis in her maternal grandmother; Osteoporosis in her mother; Prostate cancer in her father; Stroke in her mother.   ROS:   Please see the history of present  illness.    ROS All other systems reviewed and are negative.   PHYSICAL EXAM:   VS:  BP 106/64 (BP Location: Left Arm, Patient Position: Sitting, Cuff Size: Normal)   Pulse 73   Ht 5' 3"  (1.6 m)   Wt 139 lb 6.4 oz (63.2 kg)   LMP 04/20/2005   SpO2 96%   BMI 24.69 kg/m      General: Alert, oriented x3, no distress Head: no evidence of trauma, PERRL, EOMI, no exophtalmos or lid lag, no myxedema, no xanthelasma; normal ears, nose and oropharynx Neck: normal jugular venous pulsations and no hepatojugular reflux; brisk carotid pulses without delay and no carotid bruits Chest: clear to auscultation, no  signs of consolidation by percussion or palpation, normal fremitus, symmetrical and full respiratory excursions Cardiovascular: normal position and quality of the apical impulse, regular rhythm, normal first and second heart sounds, no murmurs, rubs or gallops Abdomen: no tenderness or distention, no masses by palpation, no abnormal pulsatility or arterial bruits, normal bowel sounds, no hepatosplenomegaly Extremities: no clubbing, cyanosis or edema; 2+ radial, ulnar and brachial pulses bilaterally; 2+ right femoral, posterior tibial and dorsalis pedis pulses; 2+ left femoral, posterior tibial and dorsalis pedis pulses; no subclavian or femoral bruits Neurological: grossly nonfocal Psych: Normal mood and affect    Wt Readings from Last 3 Encounters:  02/04/22 139 lb 6.4 oz (63.2 kg)  11/20/21 141 lb (64 kg)  10/28/21 140 lb (63.5 kg)     Studies/Labs Reviewed:   ECHO 12/01/2020   1. Left ventricular ejection fraction, by estimation, is 55%. The left  ventricle has normal function. The left ventricle has no regional wall  motion abnormalities. Left ventricular diastolic parameters were grossly  normal.   2. Right ventricular systolic function is normal. The right ventricular  size is mildly enlarged. There is normal pulmonary artery systolic  pressure. The estimated right  ventricular systolic pressure is 93.8 mmHg.   3. Left atrial size was moderately dilated.   4. Right atrial size was mild to moderately dilated.   5. The mitral valve is normal in structure. Mild mitral valve  regurgitation. No evidence of mitral stenosis.   6. Tricuspid valve regurgitation is mild to moderate.   7. The aortic valve is grossly normal. There is mild calcification of the  aortic valve. Aortic valve regurgitation is not visualized. No aortic  stenosis is present.   8. The inferior vena cava is normal in size with greater than 50%  respiratory variability, suggesting right atrial pressure of 3 mmHg.   EKG:  EKG is ordered today.  It shows atrial fibrillation with controlled ventricular rate and is otherwise a normal tracing.  BMET    Component Value Date/Time   NA 134 (L) 04/06/2021 0633   NA 137 01/22/2021 1138   K 4.2 04/06/2021 0633   CL 101 04/06/2021 0633   CO2 25 04/06/2021 0633   GLUCOSE 92 04/06/2021 0633   BUN 8 04/06/2021 0633   BUN 8 01/22/2021 1138   CREATININE 0.58 04/06/2021 0633   CALCIUM 8.6 (L) 04/06/2021 0633   GFRNONAA >60 04/06/2021 0633   GFRAA >60 03/23/2016 0315  10/20/2018 creatinine 0.77, normal liver function tests  Lipid Panel     Component Value Date/Time   TRIG 92 02/13/2021 0316    10/24/2019 Total cholesterol 161, HDL 51, triglycerides 93, calculated LDL 93  ASSESSMENT:    1. Persistent atrial fibrillation ([place])   2. Medication management       PLAN:  In order of problems listed above:  1.  Persistent atrial fibrillation: Has had an excellent response to ablation.  No clinically apparent arrhythmia events since her hospitalization with Guillain Barr syndrome, 1 year ago, even after reducing the dose of amiodarone 200 mg daily in January.  Will stop the amiodarone altogether.  Nevertheless, we will check LFTs and TSH today.  Continue anticoagulation.  She is aware of the fact that atrial fibrillation is likely to recur,  even when the initial results with ablation appeared to be excellent.  CHA2DS2-VASc 3 (age 49, gender). 2.  Celiac disease: Currently well compensated. 3.  Glaucoma: On beta-blocker eyedrops. 4.  Guillain Barr syndrome: Resolved.  5.  Hypothyroidism: Well compensated on current levothyroxine dose.  Most recent TSH 4.63. 6.  Anticoagulation: Well-tolerated.  No bleeding complications.  No falls.  Will continue unless we have a reliable way to monitor for A-fib recurrence such as a pacemaker or loop recorder.   Risk Assessment/Calculations:           Medication Adjustments/Labs and Tests Ordered: Current medicines are reviewed at length with the patient today.  Concerns regarding medicines are outlined above.  Orders Placed This Encounter  Procedures   Comprehensive metabolic panel   TSH   EKG 12-Lead   No orders of the defined types were placed in this encounter.   Patient Instructions  Medication Instructions:  STOP the Amiodarone  *If you need a refill on your cardiac medications before your next appointment, please call your pharmacy*   Lab Work: None ordered If you have labs (blood work) drawn today and your tests are completely normal, you will receive your results only by: [place] (if you have MyChart) OR A paper copy in the mail If you have any lab test that is abnormal or we need to change your treatment, we will call you to review the results.   Testing/Procedures: None ordered   Follow-Up: At [facility], you and your health needs are our priority.  As part of our continuing mission to provide you with exceptional heart care, we have created designated Provider Care Teams.  These Care Teams include your primary Cardiologist (physician) and Advanced Practice Providers (APPs -  Physician Assistants and Nurse Practitioners) who all work together to provide you with the care you need, when you need it.  We recommend signing up for the patient portal  called "MyChart".  Sign up information is provided on this After Visit Summary.  MyChart is used to connect with patients for Virtual Visits (Telemedicine).  Patients are able to view lab/test results, encounter notes, upcoming appointments, etc.  Non-urgent messages can be sent to your provider as well.   To learn more about what you can do with MyChart, go to [email or web address].    Your next appointment:   12 month(s)  The format for your next appointment:   In Person  Provider:   Sanda Klein, MD {   Important Information About Sugar         Signed, Sanda Klein, MD  02/04/2022 11:02 AM    [place]

## 2022-02-05 ENCOUNTER — Encounter: Payer: Self-pay | Admitting: *Deleted

## 2022-02-08 ENCOUNTER — Telehealth: Payer: Self-pay | Admitting: Pharmacist

## 2022-02-08 DIAGNOSIS — I480 Paroxysmal atrial fibrillation: Secondary | ICD-10-CM | POA: Diagnosis not present

## 2022-02-08 DIAGNOSIS — M85852 Other specified disorders of bone density and structure, left thigh: Secondary | ICD-10-CM | POA: Diagnosis not present

## 2022-02-08 DIAGNOSIS — E876 Hypokalemia: Secondary | ICD-10-CM | POA: Diagnosis not present

## 2022-02-08 DIAGNOSIS — M8589 Other specified disorders of bone density and structure, multiple sites: Secondary | ICD-10-CM | POA: Diagnosis not present

## 2022-02-08 DIAGNOSIS — E559 Vitamin D deficiency, unspecified: Secondary | ICD-10-CM | POA: Diagnosis not present

## 2022-02-08 NOTE — Telephone Encounter (Signed)
[employer] is conducting a phase 3 trial comparing apixaban vs asundexian (factor XI a inhibitor) in patients with AF. Dr. Adrian Prows is the PI. The primary outcome is safety and efficacy in AF. Phase 2 trials have demonstrated similar efficacy with potentially less major bleeding risk with asundexian. There is no placebo arm. Ms. Bearse was referred by Dr. Shelia Media to consider the trial. We've discussed the trial with her & she is interested. Prior to moving forward, we wanted to check with you to make sure you are OK with her participating.   Thanks!  Gaspar Bidding

## 2022-02-08 NOTE — Telephone Encounter (Signed)
No objections.  I think she would be an excellent clinical trial participant due to her medical background.

## 2022-02-12 IMAGING — MR MR THORACIC SPINE WO/W CM
4 of 9 series · 13 of 48 positions shown · IV contrast (Gadavist)
Comparison: Lumbar MRI 09/08/2020.

CLINICAL DATA: 74-year-old female with progressive weakness and
numbness in the extremities over the past 3 days. Ascending weakness
and now unable to walk. Possible [place]. Postoperative day
10 status post cardiac ablation for atrial fibrillation.

EXAM:
MRI TOTAL SPINE WITHOUT AND WITH CONTRAST
TECHNIQUE: Multisequence MR imaging of the spine from the cervical spine to the
sacrum was performed prior to and following IV contrast
administration for evaluation of spinal metastatic disease.
CONTRAST:  6.5mL GADAVIST GADOBUTROL 1 MMOL/ML IV SOLN

[Series 18: T1 · sagittal · 3.3mm · 0.62mm/px · 3 of 9 slices shown (1 of 2)]
[im 1/9]
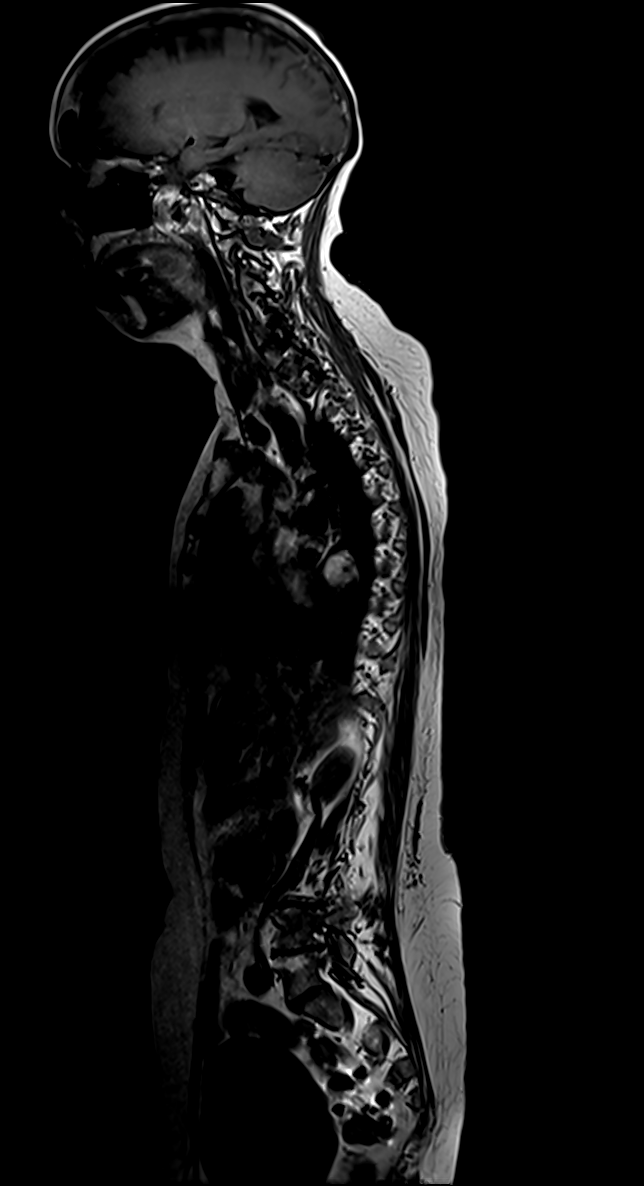
[im 5/9]
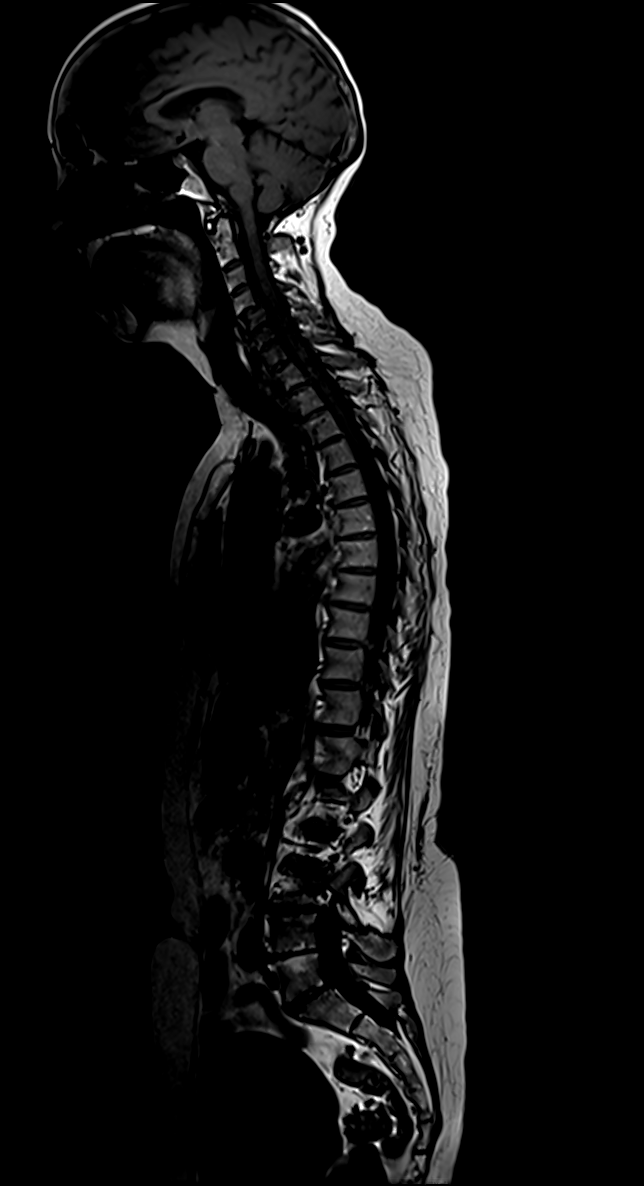
[im 9/9]
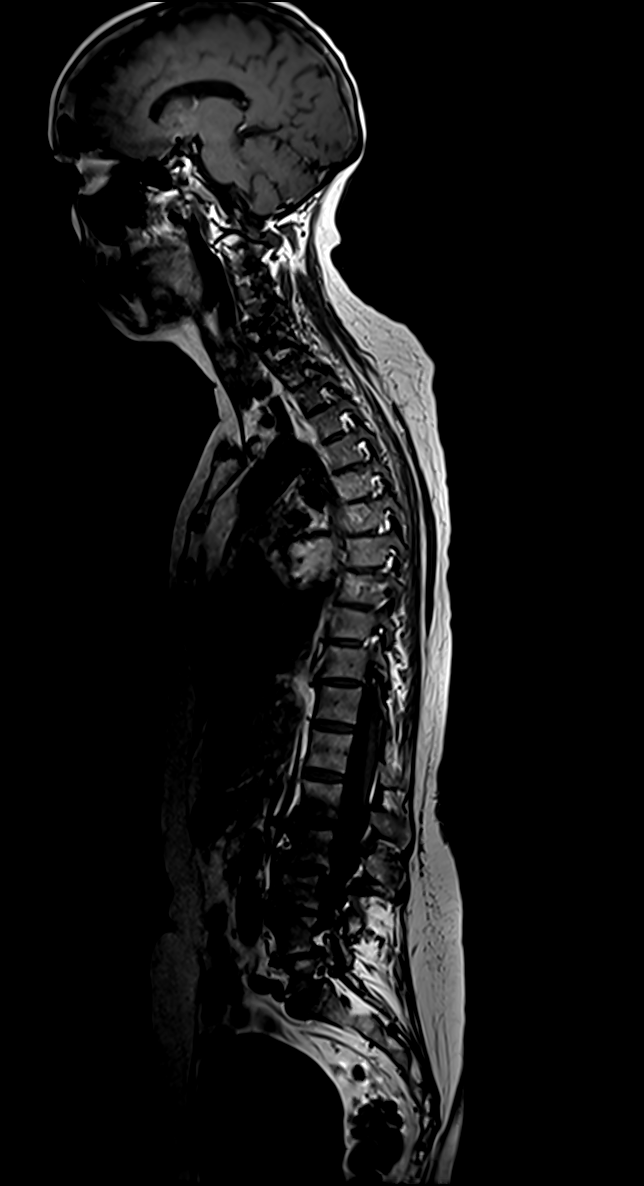

[Series 19: T2 · sagittal · 3.0mm · 0.76mm/px · 4 of 22 slices shown (1 of 2)]
[im 1/22]
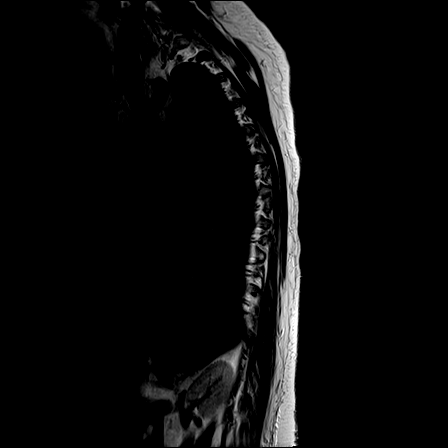
[im 6/22]
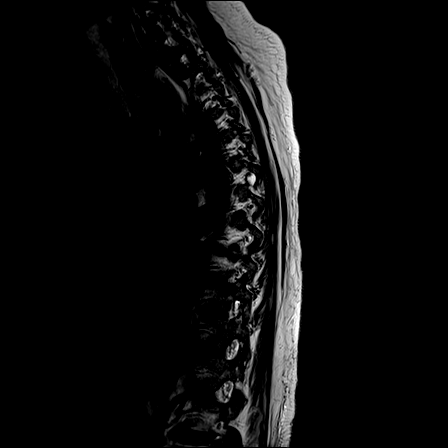
[im 11/22]
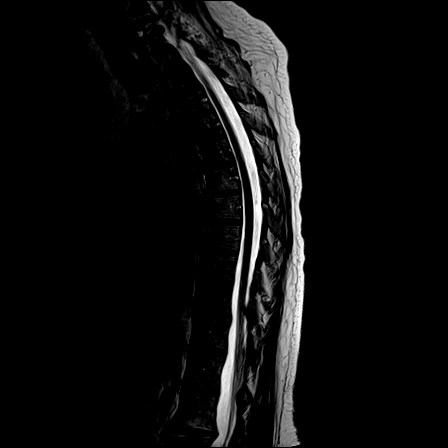
[im 22/22]
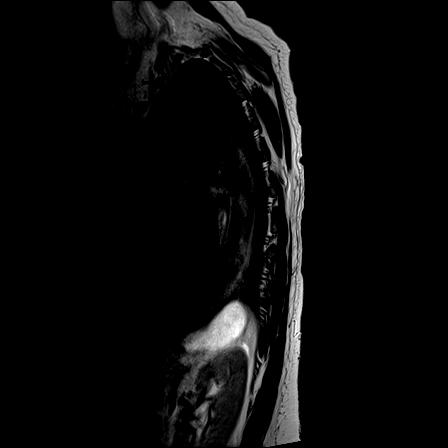

[Series 20: T1 · sagittal · 3.0mm · 0.76mm/px · 3 of 22 slices shown (2 of 2)]
[im 1/22]
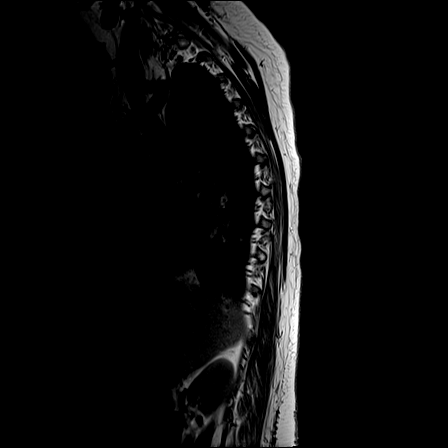
[im 15/22]
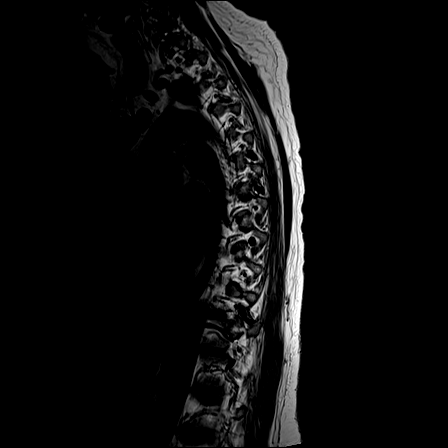
[im 22/22]
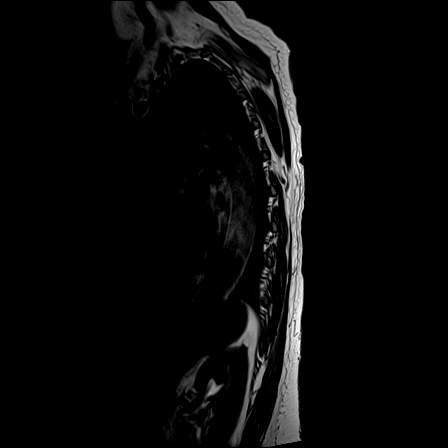

[Series 25: T2 · axial · 5.0mm · 0.59mm/px · z∈[-320,-188]mm · 3 of 39 slices shown (2 of 2)]
[im 7/39]
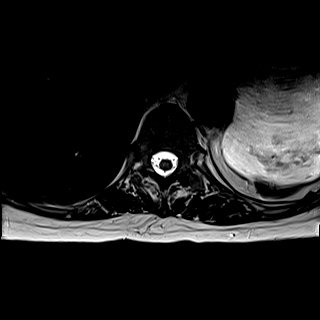
[im 20/39]
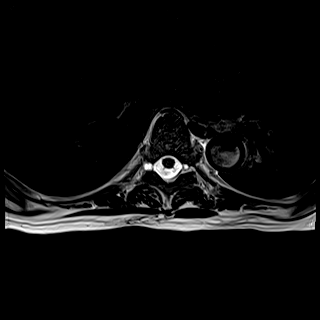
[im 32/39]
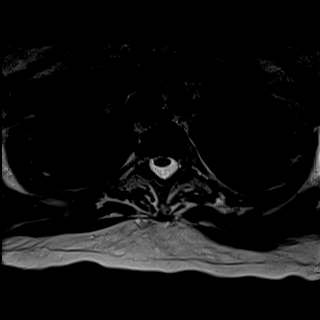

[13 of 48 positions shown; findings below may reference images not displayed]

FINDINGS: MRI CERVICAL SPINE FINDINGS

Alignment: Anterolisthesis of C4 on C5 measures 3 mm. Subtle
anterolisthesis of C2 on C3, C7 on T1. Otherwise mild straightening
of cervical lordosis.

Vertebrae: Chronic degenerative marrow signal changes in the
bilateral cervical facets, lower cervical spine endplates. No marrow
edema or evidence of acute osseous abnormality.

Cord: Normal. No cervical spinal cord signal abnormality despite up
to mild degenerative spinal cord mass effect detailed below.

Following contrast there is no convincing abnormal enhancement. No
cervical spine dural thickening.

Posterior Fossa, vertebral arteries, paraspinal tissues:
Cervicomedullary junction is within normal limits. Negative visible
posterior fossa. Negative for age other visible brain parenchyma.
Preserved major vascular flow voids in the neck. Dominant appearing
right vertebral artery. Prominent paravertebral venous system
appears to be incidental, physiologic. Negative visible neck soft
tissues and lung apices.

Disc levels:
C2-C3: Moderate to severe facet hypertrophy on the right but no
marrow edema. Trace facet joint fluid. Mild disc bulging/pseudo
disc. No spinal stenosis. Severe right C3 foraminal stenosis.
C3-C4: Mild to moderate facet hypertrophy. But no significant
stenosis.
C4-C5: Anterolisthesis with disc/pseudo disc bulging. Moderate to
severe facet hypertrophy greater on the right. No spinal stenosis.
Moderate right C5 foraminal stenosis.
C5-C6: Disc space loss. Circumferential disc osteophyte complex
eccentric to the left. Mild facet and ligament flavum hypertrophy
greater on the left. Effaced ventral CSF space and borderline to
mild spinal stenosis. Severe left C6 foraminal stenosis.
C6-C7: Disc space loss with circumferential disc osteophyte complex
eccentric to the left. Moderate left facet hypertrophy. Effaced
ventral CSF space but no spinal stenosis. Moderate to severe left
and mild right C7 foraminal stenosis.
C7-T1: Subtle anterolisthesis. Negative disc. Mild to moderate facet
hypertrophy. No stenosis.

MRI THORACIC SPINE FINDINGS

Segmentation: Hypoplastic ribs suspected at T12.  Otherwise normal.

Alignment: Mildly exaggerated upper thoracic kyphosis. There is
subtle anterolisthesis of T10 on T11.

Vertebrae: No marrow edema or evidence of acute osseous abnormality.
Visualized bone marrow signal is within normal limits. Preserved
thoracic vertebral height.

Cord: Normal. Capacious thoracic spinal canal at most levels. No
abnormal intradural enhancement. No dural thickening. The conus
medullaris appears normal at T12-L1.

Paraspinal and other soft tissues: Negative.

Disc levels:

T1-T2: Mild to moderate facet hypertrophy but no significant
stenosis.
T2-T3: Mild facet hypertrophy greater on the left. Mild left T2
foraminal stenosis.
T3-T4: Mild to moderate facet hypertrophy greater on the right. Mild
right T3 foraminal stenosis.
T4-T5: Mild to moderate facet hypertrophy greater on the right. No
significant stenosis.
T5-T6: Mild to moderate facet hypertrophy greater on the left. No
stenosis.
T6-T7: Mild to moderate left facet hypertrophy and moderate left T6
neural foraminal stenosis. Small right T6 nerve root diverticulum.
T7-T8: Negative; small right T7 nerve root diverticulum.
T8-T9: Negative.  Small left T8 nerve root diverticulum.
T9-T10: Negative.
T10-T11: Mild anterolisthesis. Negative disc. Moderate facet
hypertrophy greater on the left with trace facet joint fluid. Mild
ligament flavum hypertrophy. Moderate left T10 foraminal stenosis.
No spinal stenosis.
T11-T12: Negative.

T12-L1: Negative.

MRI LUMBAR SPINE FINDINGS

Segmentation:  Normal, concordant with the above numbering.

Alignment: Moderate levoconvex upper and dextroconvex lower lumbar
scoliosis best demonstrated on coronal imaging included with the
[REDACTED] MRI. Associated mild retrolisthesis at both L1-L2 and L2-L3 is
stable. Mild associated anterolisthesis of L5 on S1 is stable.

Vertebrae: Patchy and confluent degenerative appearing marrow edema
eccentric to the left at the L1 through L4 levels is stable since
[REDACTED]. Continued faint degenerative left L5-S1 endplate marrow edema
also. No new or suspicious marrow lesion identified. Intact visible
sacrum and SI joints.

Conus medullaris: Extends to the T12-L1 level and appears normal.

There is no thickening or enhancement of the cauda equina nerve
roots, which appear stable on axial T2 imaging since [REDACTED]. No
abnormal intradural enhancement or dural thickening.

Paraspinal and other soft tissues: Partially visible distended
urinary bladder, at least 350 mL estimated volume. Severe
diverticulosis of large bowel in the pelvis. No pelvic inflammation
is evident. Negative visible abdominal viscera.

Disc levels:

Advanced lumbar spine disc and endplate degeneration from L1-L2
through L5-S1 appears stable since [REDACTED], notable for
- mild to moderate right lateral recess stenosis and right foraminal
stenosis at L1-L2 (right L1 and L2 nerve levels),
- mild to moderate bilateral foraminal stenosis at L2 (bilateral L2
nerve levels),
- moderate to severe left lateral recess stenosis at L3-L4 with
moderate spinal stenosis and moderate to severe right foraminal
stenosis right L3 and left L4 nerve levels).

-moderate to severe left foraminal stenosis at L4-L5 (left L4 nerve
level).

-moderate to severe left foraminal stenosis at L5-S1 (left L5 nerve
level).
IMPRESSION: 1. Normal spinal cord. The conus medullaris and cauda equina nerve
roots appear stable since [REDACTED] and within normal limits. No imaging
evidence of [place] or spinal cord demyelination.

2. Advanced lumbar spine degeneration in the setting of moderate
scoliosis is stable since [REDACTED] MRI, including patchy lumbar
vertebral body marrow edema, moderate multifactorial spinal stenosis
at L3-L4, intermittent lateral recess and occasionally moderate to
severe lumbar neural foraminal stenosis (left L4 and L5 nerve
levels).

3. Generally mild for age thoracic spine degeneration, largely facet
arthropathy. No thoracic spinal stenosis. Up to moderate associated
left T6 and left T10 neural foraminal stenosis.

4. Cervical spine degeneration in the setting of mild multilevel
spondylolisthesis results in up to mild spinal stenosis at C5-C6,
and severe neural foraminal stenosis at the right C3, left C6, and
left C7 nerve levels.

5. Distended urinary bladder suspicious for Urinary Retention.
Extensive diverticulosis of large bowel in the pelvis but no active
inflammation.

## 2022-02-15 IMAGING — DX DG ABD PORTABLE 1V
1 series · 1 of 1 positions shown · non-contrast
Comparison: 02/08/2021

CLINICAL DATA: NG tube placement.

EXAM:
PORTABLE ABDOMEN - 1 VIEW

[abdomen]
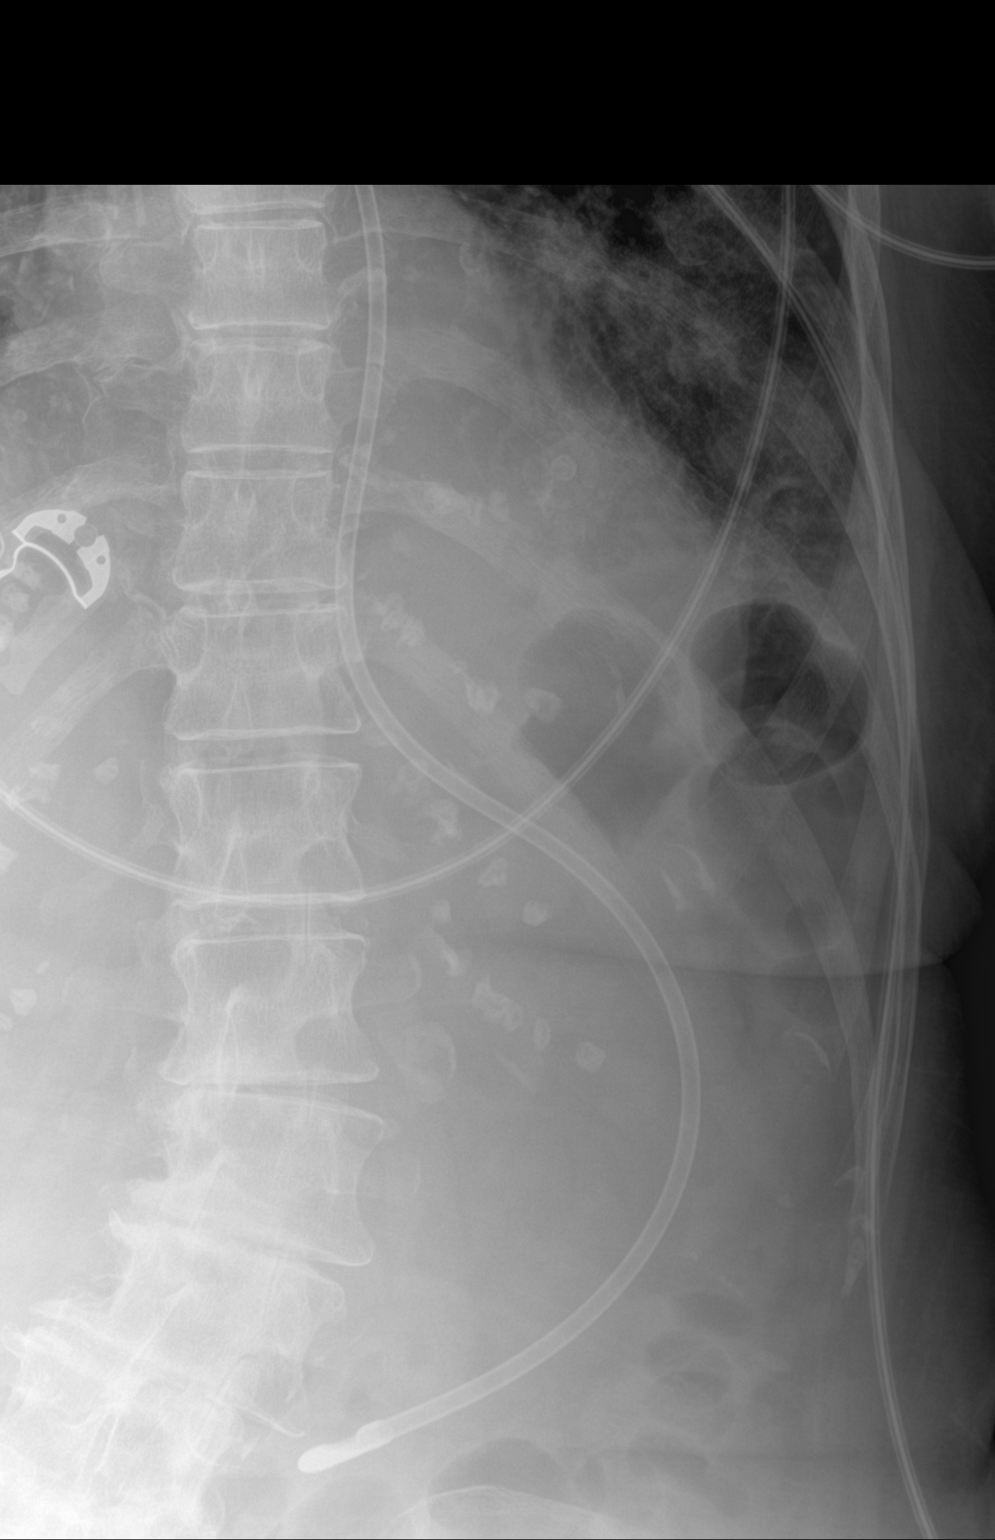

[1 of 1 positions shown; findings below may reference images not displayed]

FINDINGS: No evidence for NG tube on the current film. Feeding tube tip is
positioned in mid stomach. Patchy airspace disease noted left lung
base. Visualized abdomen demonstrates nonspecific gas pattern.
IMPRESSION: 1. Feeding tube tip is positioned in the mid stomach.
2. No evidence for NG tube on the current study.

## 2022-02-16 IMAGING — DX DG CHEST 1V PORT
1 series · 1 of 1 positions shown · non-contrast
Comparison: February 09, 2021

CLINICAL DATA: Respiratory distress

EXAM:
PORTABLE CHEST 1 VIEW

[chest]
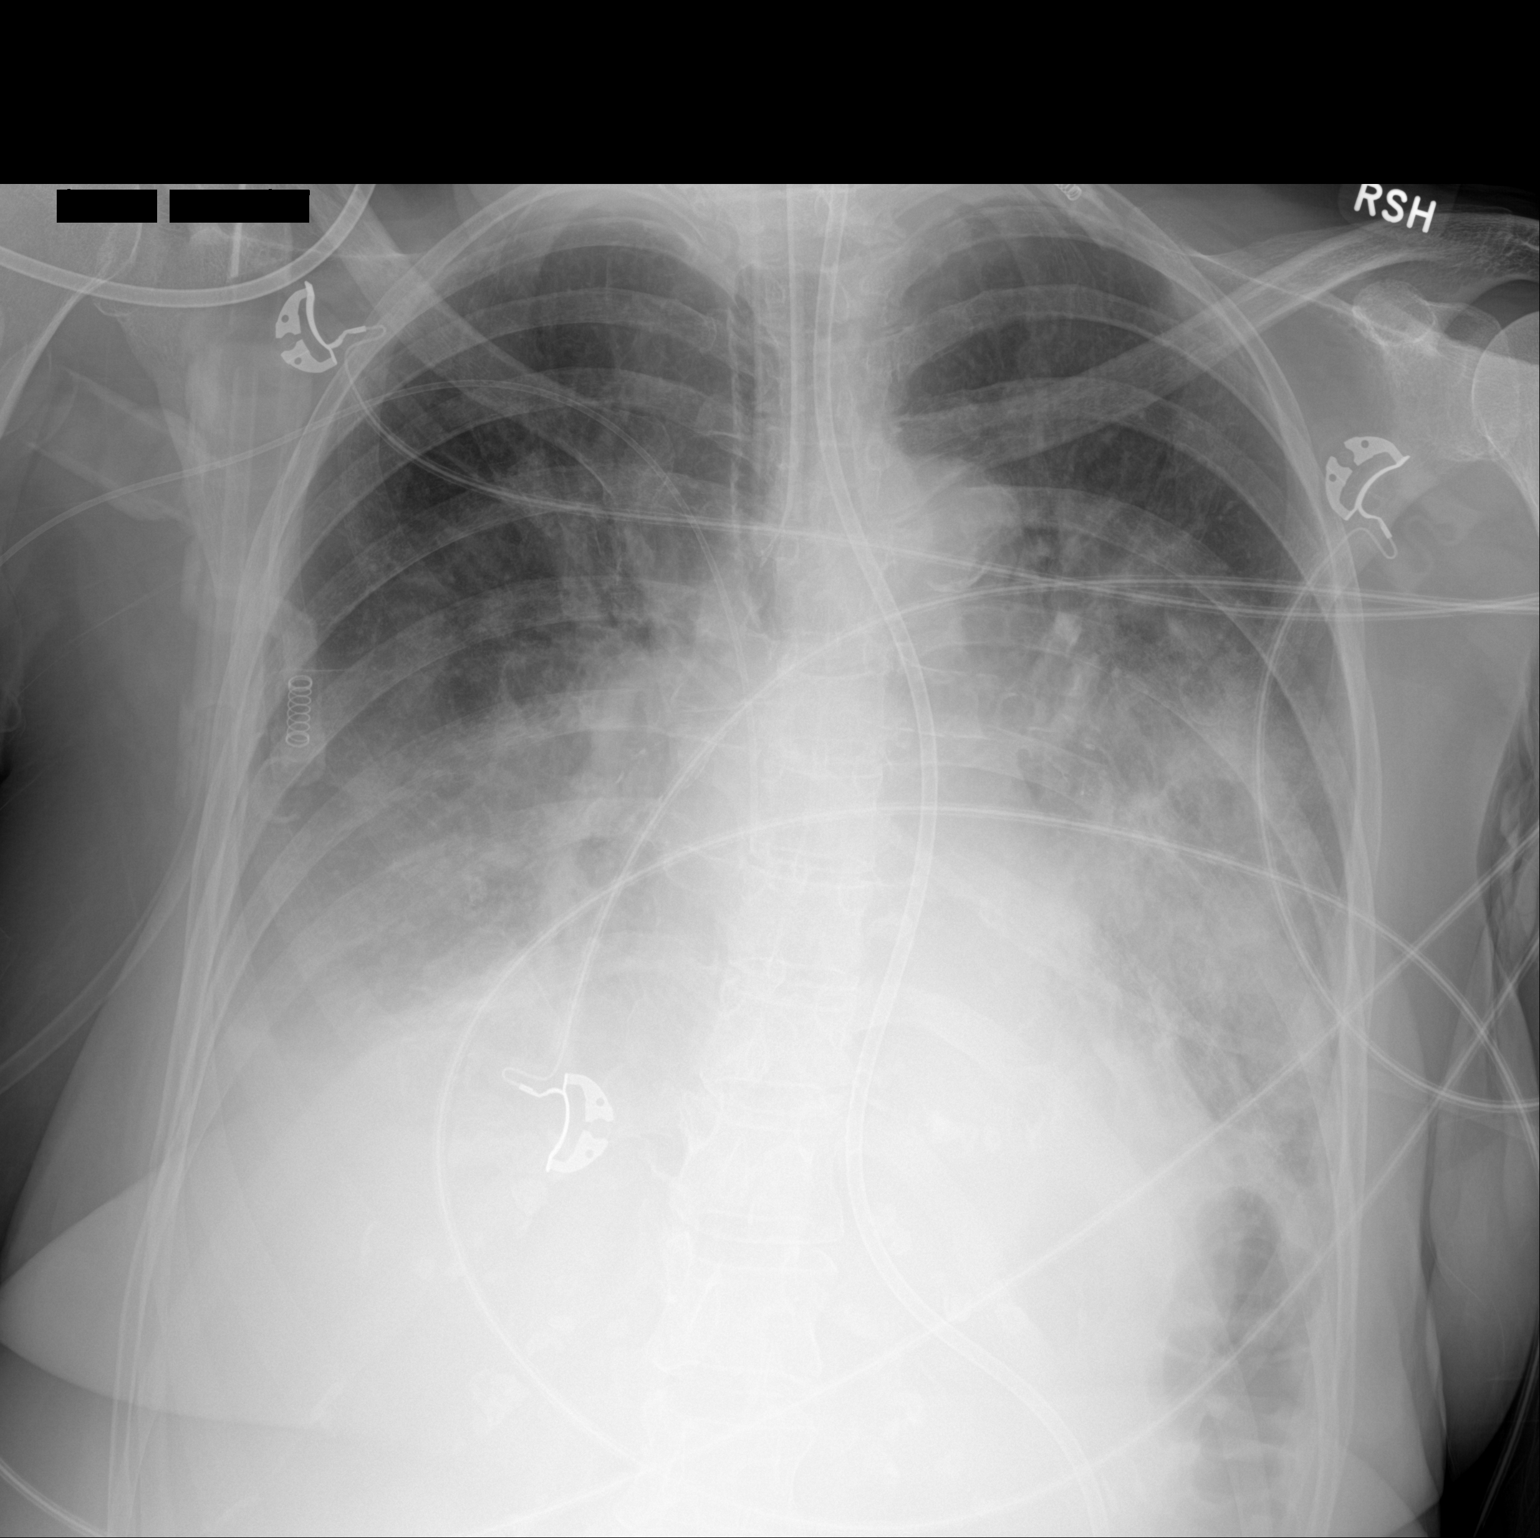

[1 of 1 positions shown; findings below may reference images not displayed]

FINDINGS: Visualized cardiac and mediastinal contours are unchanged. Stable
position right PICC line, ET tube and enteric feeding tube.

Bilateral pleural effusions and atelectasis. Increased bilateral
basilar predominant opacities.

No evidence pneumothorax.
IMPRESSION: Bilateral pleural effusions and atelectasis with increased bilateral
basilar predominant opacities, likely due to worsening layering
pleural fluid/atelectasis or pulmonary edema.

## 2022-02-22 DIAGNOSIS — E876 Hypokalemia: Secondary | ICD-10-CM | POA: Diagnosis not present

## 2022-02-24 IMAGING — DX DG CHEST 1V PORT
1 series · 1 of 1 positions shown · non-contrast
Comparison: Chest radiograph dated 02/16/2021.

CLINICAL DATA: Acute respiratory failure.

EXAM:
PORTABLE CHEST 1 VIEW

[chest ap]
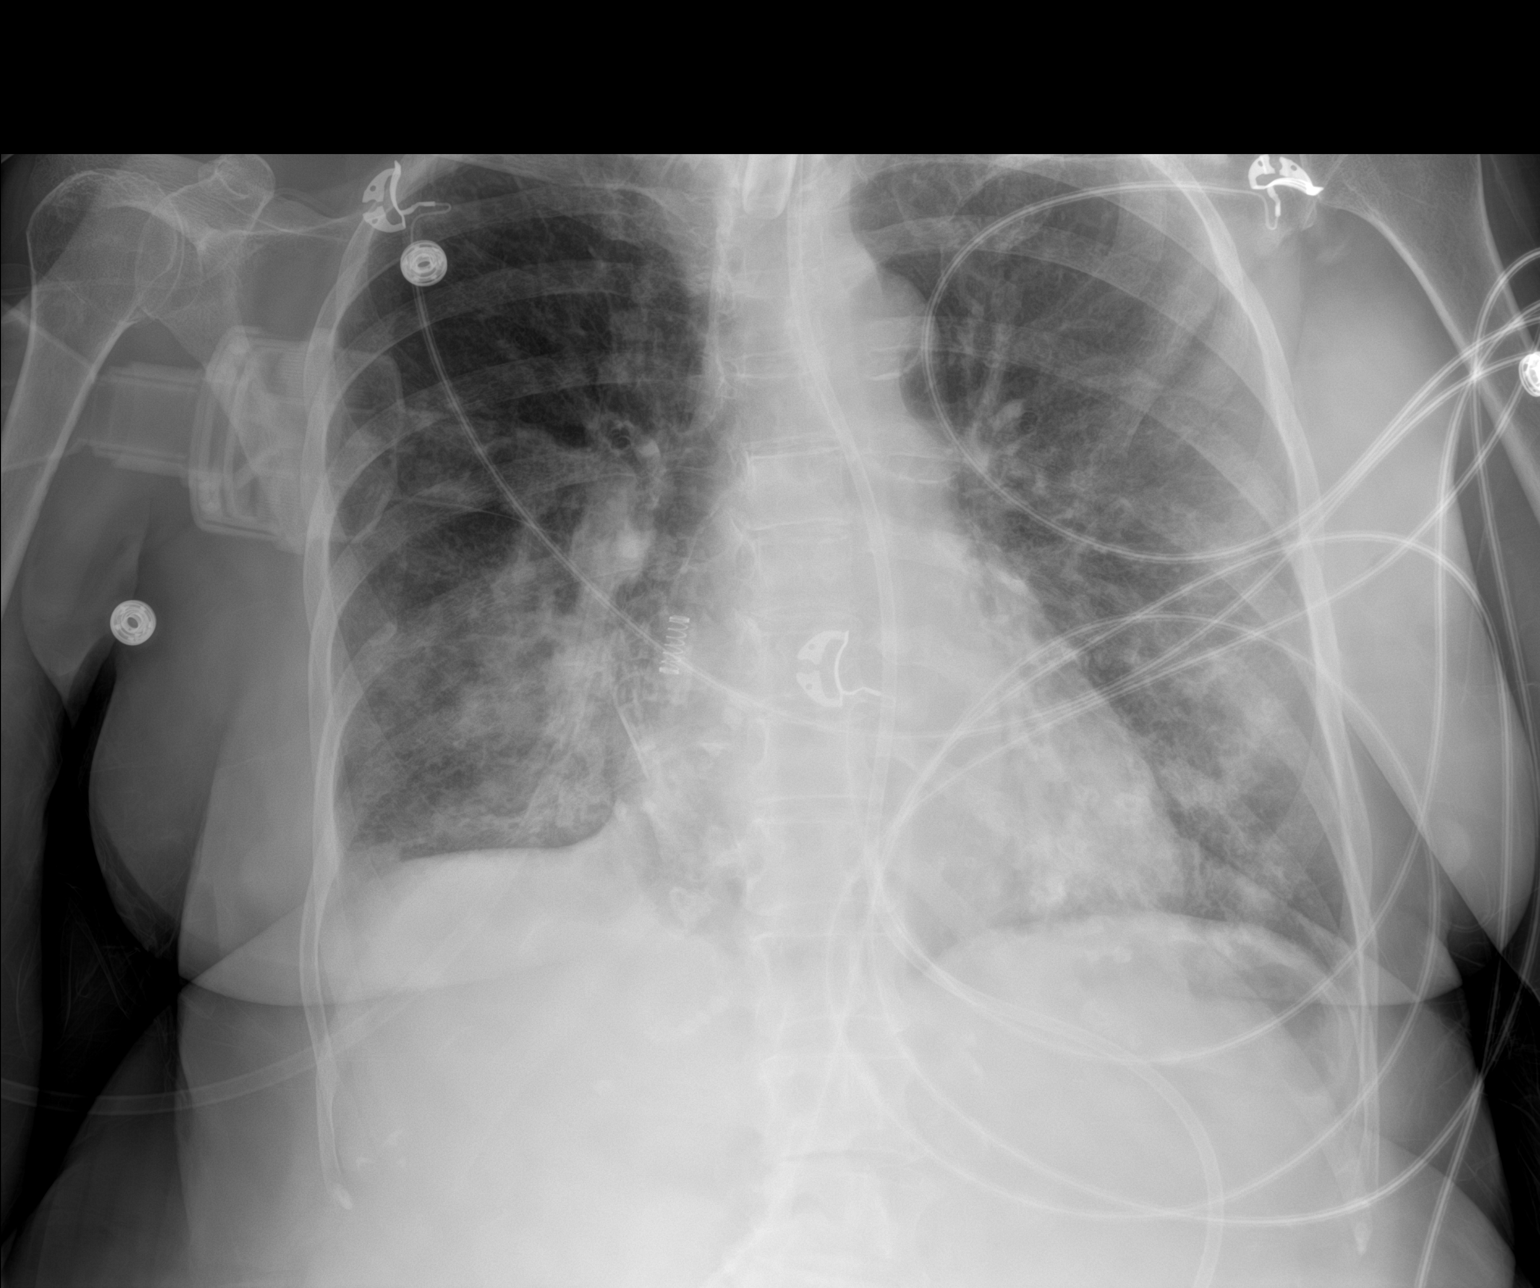

[1 of 1 positions shown; findings below may reference images not displayed]

FINDINGS: Tracheostomy above the carina. Feeding tube extends below the
diaphragm with beyond the inferior margin of image.

Bilateral mid to lower lung field pulmonary opacities, progressed
since the prior radiograph and concerning for developing infiltrate.
Aspiration is not excluded. Clinical correlation is recommended. No
pleural effusion or pneumothorax. Stable cardiomediastinal
silhouette. Atherosclerotic calcification of the aorta. No acute
osseous pathology.
IMPRESSION: Bilateral mid to lower lung field pulmonary opacities, progressed
since the prior radiograph and concerning for developing infiltrate.
Aspiration is not excluded.

## 2022-02-26 ENCOUNTER — Encounter: Payer: Self-pay | Admitting: Physical Medicine and Rehabilitation

## 2022-02-26 ENCOUNTER — Encounter
Payer: Medicare Other | Attending: Physical Medicine and Rehabilitation | Admitting: Physical Medicine and Rehabilitation

## 2022-02-26 VITALS — BP 113/55 | HR 75 | Ht 63.0 in | Wt 139.4 lb

## 2022-02-26 DIAGNOSIS — M2170 Unequal limb length (acquired), unspecified site: Secondary | ICD-10-CM | POA: Diagnosis not present

## 2022-02-26 DIAGNOSIS — G610 Guillain-Barre syndrome: Secondary | ICD-10-CM | POA: Insufficient documentation

## 2022-02-26 DIAGNOSIS — M21372 Foot drop, left foot: Secondary | ICD-10-CM | POA: Diagnosis not present

## 2022-02-26 MED ORDER — LIDOCAINE 5 % EX OINT: 1.0000 | TOPICAL_OINTMENT | Freq: Four times a day (QID) | CUTANEOUS | 5 refills | Status: DC | PRN

## 2022-02-26 MED ORDER — GABAPENTIN 300 MG PO CAPS: ORAL_CAPSULE | ORAL | 1 refills | Status: DC

## 2022-02-26 NOTE — Patient Instructions (Signed)
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 Re for f/u on GBS. 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 Done with PT Just at 1 year now -1 year anniversary on 02/05/22 Con't Gabapentin 300 mg QHS- will send in 3 months supply with 1 refill.   2.  Try to pace self as much as possible- so doesn't overdo-  That's the biggest issue that my GBS patients don't get back completely- the stamina- so just recognize that .   3. Lidocaine ointment 5%-  (of note tattoo parlors have 10%)- apply to hands- thin or thick later and can cover with arthritis gloves so doesn't come off. 50 G with 5 refills.    4. Con't Gel insert into R shoe for leg length discrepancy-   5. F/U in 6 months- and call if need anything before then   6. Con't Home exercise program daily-

## 2022-02-26 NOTE — Progress Notes (Signed)
Subjective:    Patient ID: Angela Morales, female    DOB: June 07, 1946, 76 y.o.   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 Re for f/u on GBS. 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   Using cane only outside on uneven surfaces- esp gravel, but otherwise, no assistive device.   Didn't try any different cane for hand arthritis- doesn't cause hands to be painful since doesn't use enough.   Voltaren gel didn't really help- hands wors ein cold- will try again in cold.   Put gel insert in R shoe only- has given her ~ 1/2 inch and that , she thinks, has made a difference.   Finished therapy early July- has continued going ot pool 1x/week in therapy pool- going well- is indoor pool.  Pool is ~ 93 degrees! Which is great for her and husband goes to regular pool while she does aqua exercises-  Mainly does walking in all directions and squats and planks A lot of the HEP is due to stretching out hips and back due to scoliosis- and also stands on 1 leg- Flamingo pose, etc.   Still doing HEP at home-   Graduated to regular toilet without BSC over it-   Still taking gabapentin at bedtime- helps legs to relax.  Tried without it and legs got more restless and rests better.   Now off Amiodarone as well.  Hasn't needed any melatonin either.   Is looking into a study for replacing Eliquis with another drug- is a 1x/day and side effects - less bleeding- might not be able to take due to GBS.  Did get clearance from Cardiologist for study.    No more episodes of Afib- and could usually feel it when went into Afib.   If gets pain, it's hands due to hand arthritis and R low  back due to scoliosis- not due to GBS   Pain Inventory Average Pain 3 Pain Right Now 1 My pain is dull and aching  LOCATION OF PAIN  hand, back  BOWEL Number of stools per week: 7 Oral laxative use Yes  Type of laxative fiber supplement Enema or suppository use No  History of colostomy No  Incontinent No   BLADDER Normal In and out cath, frequency . Able to self cath  . Bladder incontinence No  Frequent urination No  Leakage with coughing No  Difficulty starting stream No  Incomplete bladder emptying No    Mobility use a cane how many minutes can you walk? 30 ability to climb steps?  yes do you drive?  yes  Function retired  Neuro/Psych No problems in this area  Prior Studies Any changes since last visit?  no  Physicians involved in your care Any changes since last visit?  no   Family History  Problem Relation Age of Onset   Osteoporosis Mother    Stroke Mother        mini   Prostate cancer Father    Heart disease Brother 59  shunt put in heart   Osteoarthritis Maternal Grandmother    Bipolar disorder Son    Liver disease Son    Breast cancer Cousin        1st cousin   Colon cancer Neg Hx    Social History   Socioeconomic History   Marital status: Married    Spouse name: [name]   Number of children: 3   Years of education: Not on file   Highest education level: Not on file  Occupational History   Occupation: retired  Tobacco Use   Smoking status: Never   Smokeless tobacco: Never  Vaping Use   Vaping Use: Never used  Substance and Sexual Activity   Alcohol use: Yes    Alcohol/week: 4.0 - 6.0 standard drinks of alcohol    Types: 4 - 6 Glasses of wine per week    Comment: occasional   Drug use: No   Sexual activity: Yes    Partners: Male    Birth control/protection: Surgical    Comment: TVH   Other Topics Concern   Not on file  Social History Narrative   Lives with husband and son   Left handed   Caffeine: 1 cup of coffee a day   Social Determinants of Health   Financial Resource Strain: Not on file  Food Insecurity: Not on file  Transportation Needs: Not on file  Physical Activity: Not on file  Stress: Not on file  Social Connections: Not on file   Past Surgical History:  Procedure Laterality Date   APPENDECTOMY     ATRIAL FIBRILLATION ABLATION N/A 01/28/2021   Procedure: [place];  Surgeon: Constance Haw, MD;  Location: [place] [facility];  Service: Cardiovascular;  Laterality: N/A;   CARDIOVERSION N/A 10/30/2020   Procedure: CARDIOVERSION;  Surgeon: Jerline Pain, MD;  Location: [facility] ENDOSCOPY;  Service: Cardiovascular;  Laterality: N/A;   CARDIOVERSION N/A 03/16/2021   Procedure: CARDIOVERSION;  Surgeon: Thayer Headings, MD;  Location: [facility] ENDOSCOPY;  Service: Cardiovascular;  Laterality: N/A;   COLONOSCOPY     FOOT FRACTURE SURGERY  10/2013   TONSILLECTOMY AND ADENOIDECTOMY     TOTAL KNEE ARTHROPLASTY Left 03/22/2016   Procedure: TOTAL KNEE ARTHROPLASTY;  Surgeon: Dorna Leitz, MD;  Location: [place];  Service: Orthopedics;  Laterality: Left;   TUBAL LIGATION  1981   VAGINAL HYSTERECTOMY  04/20/05   prolapse, adenomyosis   Past Medical History:  Diagnosis Date   Abnormal uterine bleeding    on HRT   Allergy    seasonal   Arthritis    Atrial fibrillation (HCC)    Celiac disease    DDD (degenerative disc disease)    Dysrhythmia    GBS (Guillain-Barre syndrome) (HCC)    Glaucoma    Microscopic colitis    Osteoarthritis of both knees    PONV (postoperative nausea and vomiting)    SVT (supraventricular tachycardia) (HCC)    BP (!) 113/55   Pulse 75   Ht 5' 3"  (1.6 m)   Wt 139 lb 6.4 oz (63.2 kg)   LMP 04/20/2005   SpO2 98%   BMI 24.69 kg/m   Opioid Risk Score:   Fall Risk Score:  `1  Depression screen [facility] 2/9      02/26/2022    9:10 AM 11/20/2021    9:08 AM 08/14/2021   10:18 AM 05/15/2021   11:37 AM  Depression screen PHQ 2/9  Decreased Interest 0 0 0 0  Down, Depressed, Hopeless 0  0 0 0  PHQ - 2 Score 0 0 0 0  Altered sleeping    2  Tired, decreased energy    1  Change in appetite    0  Feeling bad or failure about yourself     1  Trouble concentrating    0  Moving slowly or fidgety/restless    0  Suicidal thoughts    0  PHQ-9 Score    4  Difficult doing work/chores    Not difficult at all    Family History  Problem Relation Age of Onset   Osteoporosis Mother    Stroke Mother        mini   Prostate cancer Father    Heart disease Brother 44       shunt put in heart   Osteoarthritis Maternal Grandmother    Bipolar disorder Son    Liver disease Son    Breast cancer Cousin        1st cousin   Colon cancer Neg Hx    Social History   Socioeconomic History   Marital status: Married    Spouse name: [name]   Number of children: 3   Years of education: Not on file   Highest education level: Not on file  Occupational History   Occupation: retired  Tobacco Use   Smoking status: Never   Smokeless tobacco: Never  Vaping Use   Vaping Use: Never used  Substance and Sexual Activity   Alcohol use: Yes    Alcohol/week: 4.0 - 6.0 standard drinks of alcohol    Types: 4 - 6 Glasses of wine per week    Comment: occasional   Drug use: No   Sexual activity: Yes    Partners: Male    Birth control/protection: Surgical    Comment: TVH  Other Topics Concern   Not on file  Social History Narrative   Lives with husband and son   Left handed   Caffeine: 1 cup of coffee a day   Social Determinants of Health   Financial Resource Strain: Not on file  Food Insecurity: Not on file  Transportation Needs: Not on file  Physical Activity: Not on file  Stress: Not on file  Social Connections: Not on file   Past Surgical History:  Procedure Laterality Date   APPENDECTOMY     ATRIAL FIBRILLATION  ABLATION N/A 01/28/2021   Procedure: [place];  Surgeon: Constance Haw, MD;  Location: [place] [facility];  Service: Cardiovascular;  Laterality: N/A;   CARDIOVERSION N/A 10/30/2020   Procedure: CARDIOVERSION;  Surgeon: Jerline Pain, MD;  Location: [facility] ENDOSCOPY;  Service: Cardiovascular;  Laterality: N/A;   CARDIOVERSION N/A 03/16/2021   Procedure: CARDIOVERSION;  Surgeon: Thayer Headings, MD;  Location: [facility] ENDOSCOPY;  Service: Cardiovascular;  Laterality: N/A;   COLONOSCOPY     FOOT FRACTURE SURGERY  10/2013   TONSILLECTOMY AND ADENOIDECTOMY     TOTAL KNEE ARTHROPLASTY Left 03/22/2016   Procedure: TOTAL KNEE ARTHROPLASTY;  Surgeon: Dorna Leitz, MD;  Location: [place];  Service: Orthopedics;  Laterality: Left;   TUBAL LIGATION  1981   VAGINAL HYSTERECTOMY  04/20/05   prolapse, adenomyosis   Past Surgical History:  Procedure Laterality Date   APPENDECTOMY     ATRIAL FIBRILLATION ABLATION N/A 01/28/2021   Procedure: ATRIAL FIBRILLATION ABLATION;  Surgeon: Constance Haw, MD;  Location: [place] [facility];  Service: Cardiovascular;  Laterality: N/A;   CARDIOVERSION N/A 10/30/2020   Procedure: CARDIOVERSION;  Surgeon: Jerline Pain, MD;  Location: [facility] ENDOSCOPY;  Service: Cardiovascular;  Laterality: N/A;   CARDIOVERSION N/A 03/16/2021   Procedure: CARDIOVERSION;  Surgeon: Thayer Headings, MD;  Location: [facility] ENDOSCOPY;  Service: Cardiovascular;  Laterality: N/A;   COLONOSCOPY     FOOT FRACTURE SURGERY  10/2013   TONSILLECTOMY AND ADENOIDECTOMY     TOTAL KNEE ARTHROPLASTY Left 03/22/2016   Procedure: TOTAL KNEE ARTHROPLASTY;  Surgeon: Dorna Leitz, MD;  Location: [place];  Service: Orthopedics;  Laterality: Left;   TUBAL LIGATION  1981   VAGINAL HYSTERECTOMY  04/20/05   prolapse, adenomyosis   Past Medical History:  Diagnosis Date   Abnormal uterine bleeding    on HRT   Allergy    seasonal   Arthritis    Atrial fibrillation (HCC)    Celiac disease    DDD  (degenerative disc disease)    Dysrhythmia    GBS (Guillain-Barre syndrome) (HCC)    Glaucoma    Microscopic colitis    Osteoarthritis of both knees    PONV (postoperative nausea and vomiting)    SVT (supraventricular tachycardia) ([place])    LMP 04/20/2005   Opioid Risk Score:   Fall Risk Score:  `1  Depression screen [facility] 2/9     11/20/2021    9:08 AM 08/14/2021   10:18 AM 05/15/2021   11:37 AM  Depression screen PHQ 2/9  Decreased Interest 0 0 0  Down, Depressed, Hopeless 0 0 0  PHQ - 2 Score 0 0 0  Altered sleeping   2  Tired, decreased energy   1  Change in appetite   0  Feeling bad or failure about yourself    1  Trouble concentrating   0  Moving slowly or fidgety/restless   0  Suicidal thoughts   0  PHQ-9 Score   4  Difficult doing work/chores   Not difficult at all     Review of Systems  Musculoskeletal:  Positive for back pain and gait problem.  All other systems reviewed and are negative.     Objective:   Physical Exam  Awake, alert, appropriate, brought single point cane- didn't use on the way in, NAD MS: UE strength - 5-/5 B/L in biceps, Triceps, WE, grip and FA- and 4+5 in deltoids- not limited by pain LE strength- RLE-  HF 4+/5; KE and PF 5-/5; DF 5-/5 and PF 5-/5 LLE- HF 4/5; KE 4+/5 and PF 5-/5; DF 4+/5 and PF almost 5-/5- slightly weaker than R foot.    Neuro Still doesn't have Patella or Achilles DTRs- are absent; however biceps and brachioradialis are 2+ B/L   Gait- flexed posture at hips- also scoliosis noted-tilts to R But much better gait!     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 Re for f/u on GBS. R leg shorter  than L- leg length discrepancy  Off Amiodarone now! Done with PT Just at 1 year now -1 year anniversary on 02/05/22 Con't Gabapentin 300 mg QHS- will send in 3 months supply with 1 refill.   2.  Try to pace self as much as possible- so doesn't overdo-  That's the biggest issue that my GBS patients  don't get back completely- the stamina- so just recognize that .   3. Lidocaine ointment 5%-  (of note tattoo parlors have 10%)- apply to hands- thin or thick later and can cover with arthritis gloves so doesn't come off. 50 G with 5 refills.    4. Con't Gel insert into R shoe for leg length discrepancy-   5. F/U in 6 months- and call if need anything before then   6. Con't Home exercise program daily-     I spent a total of  31  minutes on total care today- >50% coordination of care- due to education on hand arthritis and GBS

## 2022-03-02 DIAGNOSIS — H6121 Impacted cerumen, right ear: Secondary | ICD-10-CM | POA: Diagnosis not present

## 2022-03-02 DIAGNOSIS — H93299 Other abnormal auditory perceptions, unspecified ear: Secondary | ICD-10-CM | POA: Diagnosis not present

## 2022-03-04 IMAGING — DX DG CHEST 1V PORT
1 series · 1 of 1 positions shown · non-contrast
Comparison: Radiograph 02/18/2021, chest CT 02/06/2021

CLINICAL DATA: Shortness of breath, tracheostomy

EXAM:
PORTABLE CHEST 1 VIEW

[chest]
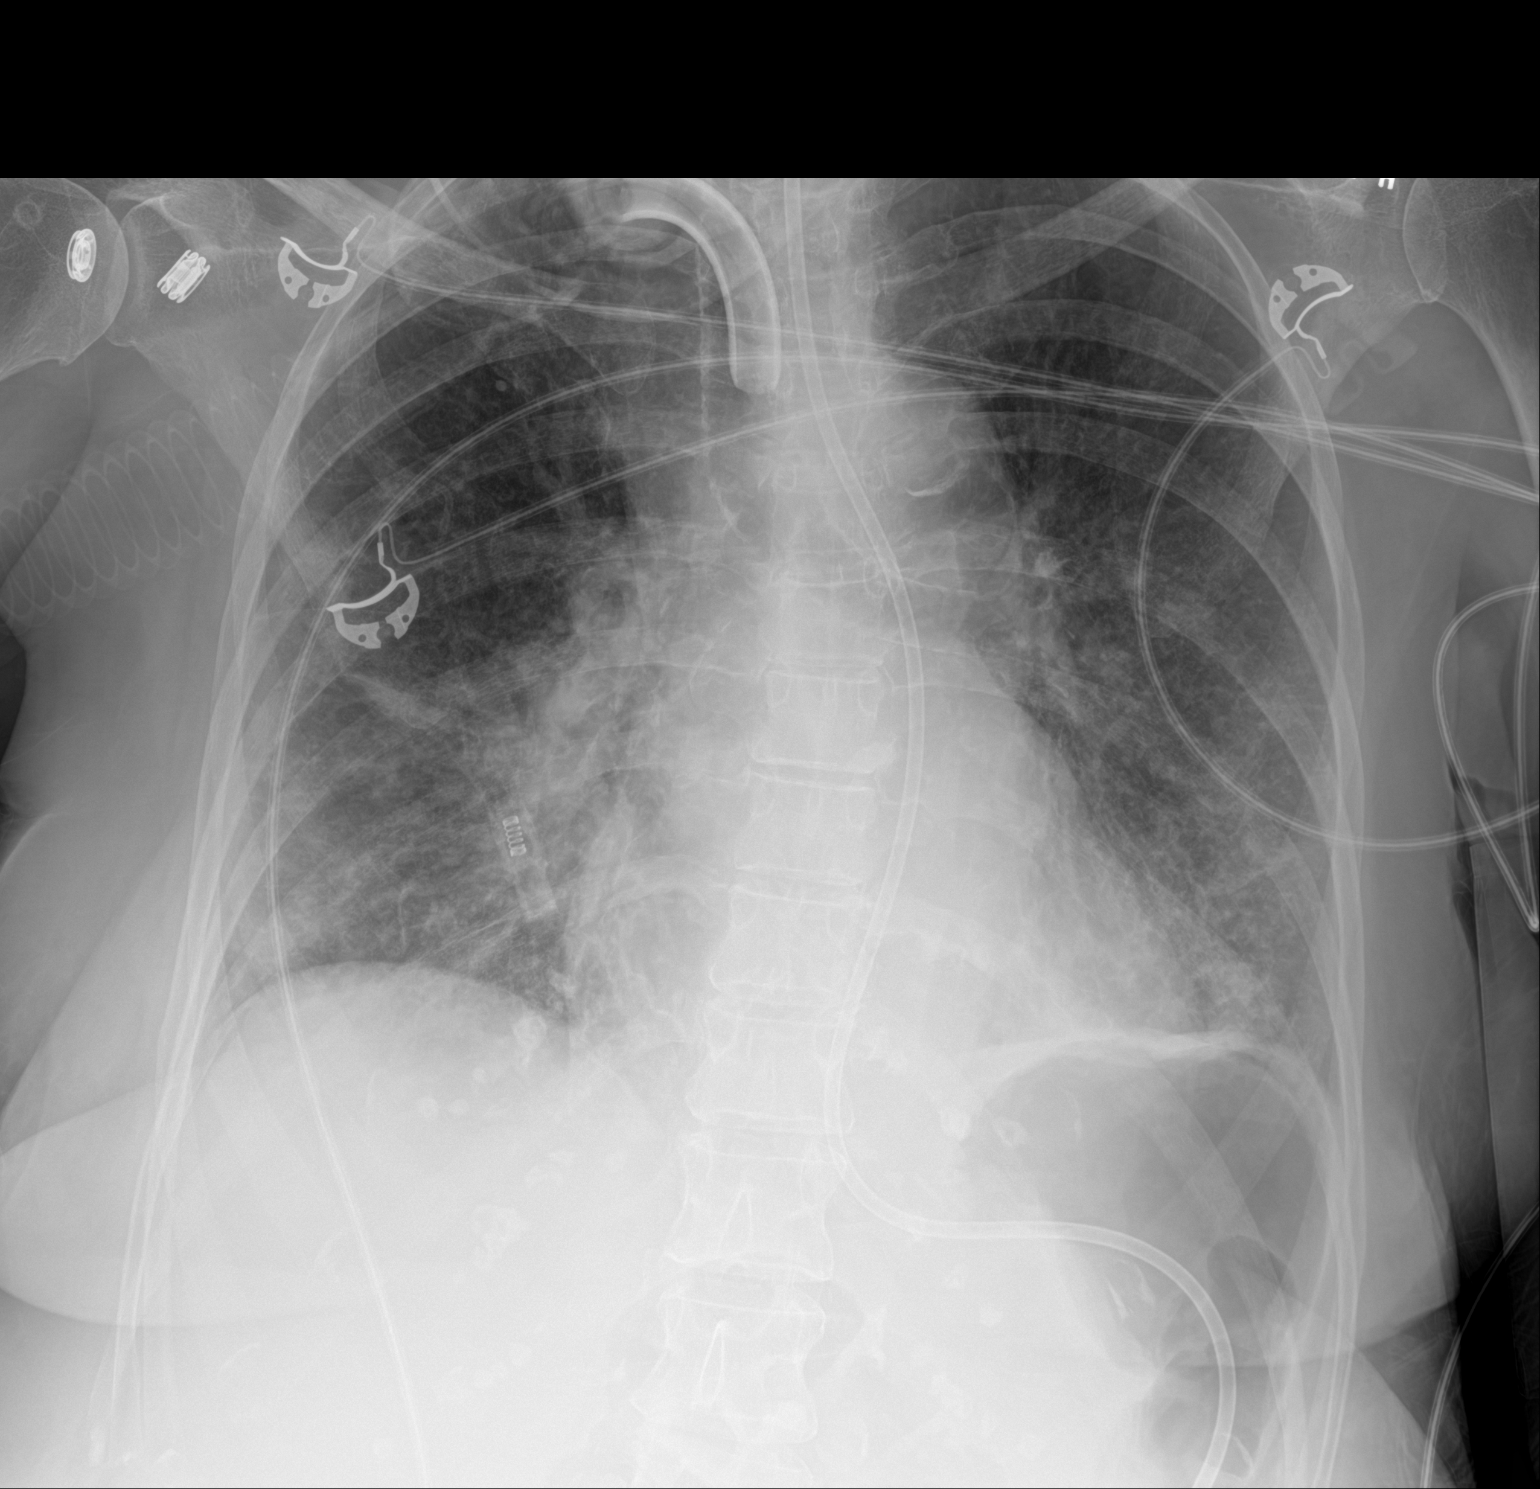

[1 of 1 positions shown; findings below may reference images not displayed]

FINDINGS: Tracheostomy tube overlies the midthoracic trachea. There is a
feeding tube which passes below the diaphragm, tip excluded by
collimation. There are bilateral airspace opacities, slightly
improved from prior exam. There is no large pleural effusion or
visible pneumothorax. Bones are unchanged.
IMPRESSION: Persistent bilateral airspace disease in the mid to lower lungs,
slightly improved in comparison to prior exam.

## 2022-06-09 DIAGNOSIS — H40053 Ocular hypertension, bilateral: Secondary | ICD-10-CM | POA: Diagnosis not present

## 2022-07-07 ENCOUNTER — Encounter: Payer: Self-pay | Admitting: Cardiology

## 2022-07-07 ENCOUNTER — Ambulatory Visit: Payer: Medicare Other | Attending: Cardiology | Admitting: Cardiology

## 2022-07-07 VITALS — BP 120/74 | HR 73 | Ht 63.0 in | Wt 140.2 lb

## 2022-07-07 DIAGNOSIS — I4819 Other persistent atrial fibrillation: Secondary | ICD-10-CM | POA: Diagnosis not present

## 2022-07-07 DIAGNOSIS — D6869 Other thrombophilia: Secondary | ICD-10-CM | POA: Diagnosis not present

## 2022-07-07 NOTE — Patient Instructions (Signed)
Medication Instructions:  Your physician recommends that you continue on your current medications as directed. Please refer to the Current Medication list given to you today.  *If you need a refill on your cardiac medications before your next appointment, please call your pharmacy*   Lab Work: None ordered  Testing/Procedures: None ordered   Follow-Up: At [facility], you and your health needs are our priority.  As part of our continuing mission to provide you with exceptional heart care, we have created designated Provider Care Teams.  These Care Teams include your primary Cardiologist (physician) and Advanced Practice Providers (APPs -  Physician Assistants and Nurse Practitioners) who all work together to provide you with the care you need, when you need it.  Your next appointment:   6 month(s)  The format for your next appointment:   In Person  Provider:   You will follow up in the Atrial Fibrillation Clinic located at [facility]. Your provider will be: Donna Carroll, NP or Clint R. Fenton, PA-C    Thank you for choosing [facility]!!   Shondell Fabel, RN [phone]  Other Instructions    Important Information About Sugar           

## 2022-07-07 NOTE — Progress Notes (Signed)
Electrophysiology Office Note   Date:  07/07/2022   ID:  Rhapsody, Wolven 1945-11-13, MRN [id number]  PCP:  Deland Pretty, MD  Cardiologist:  [place] Primary Electrophysiologist:  Ernesto Zukowski Meredith Leeds, MD    Chief Complaint: AF   History of Present Illness: Angela Morales is a 77 y.o. female who is being seen today for the evaluation of AF at the request of Deland Pretty, MD. Presenting today for electrophysiology evaluation.  Significant for celiac disease, glaucoma, atrial fibrillation.  She was diagnosed with atrial fibrillation in 2018 in the setting of a GI illness.  She was later diagnosed with celiac disease.  She developed persistent atrial fibrillation and underwent unsuccessful cardioversion.  She is now post atrial fibrillation ablation 01/28/2021.  1 week after her ablation she presents to the hospital with progressive paralysis.  She was diagnosed with Guillain-Barr syndrome.  She had prolonged hospitalization with rehab.  She presented to rehab in atrial flutter and is status post cardioversion.  Today, denies symptoms of palpitations, chest pain, shortness of breath, orthopnea, PND, lower extremity edema, claudication, dizziness, presyncope, syncope, bleeding, or neurologic sequela. The patient is tolerating medications without difficulties.  Since being seen she has done well.  She has had no further arrhythmias.  She has been able to do all of her daily activities.   Past Medical History:  Diagnosis Date   Abnormal uterine bleeding    on HRT   Allergy    seasonal   Arthritis    Atrial fibrillation (HCC)    Celiac disease    DDD (degenerative disc disease)    Dysrhythmia    GBS (Guillain-Barre syndrome) (HCC)    Glaucoma    Microscopic colitis    Osteoarthritis of both knees    PONV (postoperative nausea and vomiting)    SVT (supraventricular tachycardia)    Past Surgical History:  Procedure Laterality Date   APPENDECTOMY     ATRIAL FIBRILLATION  ABLATION N/A 01/28/2021   Procedure: ATRIAL FIBRILLATION ABLATION;  Surgeon: Constance Haw, MD;  Location: [facility];  Service: Cardiovascular;  Laterality: N/A;   CARDIOVERSION N/A 10/30/2020   Procedure: CARDIOVERSION;  Surgeon: Jerline Pain, MD;  Location: [facility] ENDOSCOPY;  Service: Cardiovascular;  Laterality: N/A;   CARDIOVERSION N/A 03/16/2021   Procedure: CARDIOVERSION;  Surgeon: Thayer Headings, MD;  Location: [facility] ENDOSCOPY;  Service: Cardiovascular;  Laterality: N/A;   COLONOSCOPY     FOOT FRACTURE SURGERY  10/2013   TONSILLECTOMY AND ADENOIDECTOMY     TOTAL KNEE ARTHROPLASTY Left 03/22/2016   Procedure: TOTAL KNEE ARTHROPLASTY;  Surgeon: Dorna Leitz, MD;  Location: [place];  Service: Orthopedics;  Laterality: Left;   TUBAL LIGATION  1981   VAGINAL HYSTERECTOMY  04/20/05   prolapse, adenomyosis     Current Outpatient Medications  Medication Sig Dispense Refill   Cholecalciferol 25 MCG (1000 UT) capsule 2 capsules Orally Once a day for 30 day(s)     ELIQUIS 5 MG TABS tablet TAKE 1 TABLET BY MOUTH TWICE A DAY 180 tablet 1   gabapentin (NEURONTIN) 300 MG capsule TAKE 1 CAPSULE BY MOUTH EVERYDAY AT BEDTIME 90 capsule 1   levothyroxine (SYNTHROID) 50 MCG tablet Take 50 mcg by mouth daily before breakfast.     lidocaine (XYLOCAINE) 5 % ointment Apply 1 Application topically 4 (four) times daily as needed. Apply- to hands -can also use other places- penetrates 1-1.5 inches into tissues- for arthritis pain 50 g 5   polyethylene glycol (  MIRALAX / GLYCOLAX) 17 g packet Take 17 g by mouth daily as needed. (Patient taking differently: Take 17 g by mouth daily as needed (constipation).) 14 each 0   polyvinyl alcohol (LIQUIFILM TEARS) 1.4 % ophthalmic solution Place 1 drop into both eyes as needed for dry eyes. 15 mL 0   potassium chloride SA (KLOR-CON M) 20 MEQ tablet TAKE 2 TABLETS (60 MEQ TOTAL) BY MOUTH DAILY     timolol (TIMOPTIC) 0.5 % ophthalmic solution Place 1 drop into the left  eye 2 (two) times daily. 10 mL    ZIOPTAN 0.0015 % SOLN Place 1 drop into the left eye at bedtime.     No current facility-administered medications for this visit.    Allergies:   Gluten meal, Lactose intolerance (gi), Cefdinir, and Robaxin [methocarbamol]   Social History:  The patient  reports that she has never smoked. She has never used smokeless tobacco. She reports current alcohol use of about 4.0 - 6.0 standard drinks of alcohol per week. She reports that she does not use drugs.   Family History:  The patient's family history includes Bipolar disorder in her son; Breast cancer in her cousin; Heart disease (age of onset: 11) in her brother; Liver disease in her son; Osteoarthritis in her maternal grandmother; Osteoporosis in her mother; Prostate cancer in her father; Stroke in her mother.   ROS:  Please see the history of present illness.   Otherwise, review of systems is positive for none.   All other systems are reviewed and negative.   PHYSICAL EXAM: VS:  BP 120/74   Pulse 73   Ht 5\' 3"  (1.6 m)   Wt 140 lb 3.2 oz (63.6 kg)   LMP 04/20/2005   SpO2 97%   BMI 24.84 kg/m  , BMI Body mass index is 24.84 kg/m. GEN: Well nourished, well developed, in no acute distress  HEENT: normal  Neck: no JVD, carotid bruits, or masses Cardiac: RRR; no murmurs, rubs, or gallops,no edema  Respiratory:  clear to auscultation bilaterally, normal work of breathing GI: soft, nontender, nondistended, + BS MS: no deformity or atrophy  Skin: warm and dry Neuro:  Strength and sensation are intact Psych: euthymic mood, full affect  EKG:  EKG is ordered today. Personal review of the ekg ordered shows sinus rhythm   Recent Labs: 02/04/2022: ALT 12; BUN 12; Creatinine, Ser 0.73; Potassium 5.0; Sodium 141; TSH 3.650    Lipid Panel     Component Value Date/Time   TRIG 92 02/13/2021 0316     Wt Readings from Last 3 Encounters:  07/07/22 140 lb 3.2 oz (63.6 kg)  02/26/22 139 lb 6.4 oz (63.2 kg)   02/04/22 139 lb 6.4 oz (63.2 kg)      Other studies Reviewed: Additional studies/ records that were reviewed today include: TTE 12/05/20  Review of the above records today demonstrates:   1. Left ventricular ejection fraction, by estimation, is 55%. The left  ventricle has normal function. The left ventricle has no regional wall  motion abnormalities. Left ventricular diastolic parameters were grossly  normal.   2. Right ventricular systolic function is normal. The right ventricular  size is mildly enlarged. There is normal pulmonary artery systolic  pressure. The estimated right ventricular systolic pressure is 25.7 mmHg.   3. Left atrial size was moderately dilated.   4. Right atrial size was mild to moderately dilated.   5. The mitral valve is normal in structure. Mild mitral valve  regurgitation. No  evidence of mitral stenosis.   6. Tricuspid valve regurgitation is mild to moderate.   7. The aortic valve is grossly normal. There is mild calcification of the  aortic valve. Aortic valve regurgitation is not visualized. No aortic  stenosis is present.   8. The inferior vena cava is normal in size with greater than 50%  respiratory variability, suggesting right atrial pressure of 3 mmHg.    ASSESSMENT AND PLAN:  1.  Persistent atrial fibrillation/atrial flutter: Currently on Eliquis 5 mg twice daily.  CHA2DS2-VASc of 3.  Status post ablation 01/28/2021.  Post ablation she developed Guillain-Barr syndrome.  She has recovered from her [place].  Has remained in sinus rhythm.  No changes.  2.  Secondary hypercoagulable state: Currently on Eliquis for atrial fibrillation as above   Current medicines are reviewed at length with the patient today.   The patient does not have concerns regarding her medicines.  The following changes were made today: None  Labs/ tests ordered today include:  Orders Placed This Encounter  Procedures   EKG 12-Lead      Disposition:   FU 6  months  Signed, Loma [place]uque Meredith Leeds, MD  07/07/2022 4:06 PM     [address] [place] [address] [phone] (office) [phone] (fax)

## 2022-08-09 ENCOUNTER — Other Ambulatory Visit: Payer: Self-pay | Admitting: Physical Medicine and Rehabilitation

## 2022-08-27 ENCOUNTER — Encounter
Payer: Medicare Other | Attending: Physical Medicine and Rehabilitation | Admitting: Physical Medicine and Rehabilitation

## 2022-08-27 ENCOUNTER — Encounter: Payer: Self-pay | Admitting: Physical Medicine and Rehabilitation

## 2022-08-27 VITALS — BP 119/80 | HR 106 | Ht 63.0 in | Wt 140.0 lb

## 2022-08-27 DIAGNOSIS — M2170 Unequal limb length (acquired), unspecified site: Secondary | ICD-10-CM | POA: Diagnosis not present

## 2022-08-27 DIAGNOSIS — G610 Guillain-Barre syndrome: Secondary | ICD-10-CM | POA: Diagnosis not present

## 2022-08-27 NOTE — Patient Instructions (Signed)
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 Re for f/u on GBS. 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 Suggest Lovenox to prevent stroke if does this- while waiting for surgery. Would need hand surgeon.   2.  Hasn't had good results with lidocaine for hand pain    3. Try stopping Gabapentin for 1 week- see how it goes- if Symptoms get worse, then just restart, but can go 7 days before deciding as well.  If needs ot stay on it, can see me 1x/year and I can refill or have PCP take over Rx.    4.  At maximal improvement from GBS- still has B/L weakness in shoulders and hips which could from leftover form GBS or due to spinal issues- hard to tease out.   5.  F/U as needed- if needed Discussed that I've only seen only 2 cases of GBS occur again, so it's rare.

## 2022-08-27 NOTE — Progress Notes (Signed)
Subjective:    Patient ID: Angela Morales, female    DOB: 1946/04/11, 77 y.o.   MRN: [id number]  HPI  Pt is a 77 yr old female with hx of Guillain Barre Syndrome/AIDP with B/L foot drop- onset 02/05/21. 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 Re for f/u on GBS. 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  A couple of episodes of rapid pulse- was very brief- didn't think wa sin Afib.   Strength pretty good- some areas not as strong  Esp curbs that are higher than normal.  Stairs- 15 steps ot get to upstairs in her home.  No problem as long as has handrail.   Pain is due to low back pain and hand arthritis  When wakes up in AM, back real tight and takes awhile to loosen up.  Didn't see any difference with lidocaine patches.   Couldn't see much difference for Lidocaine cream for hands.   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   Uses gel insert in shoes- even slippers- helps leg length discrepancy.   Got refill on gabapentin- not sure if helpful but when didn't take it- got some cramps in toes.      Pain Inventory Average Pain 5 Pain Right Now 5 My pain is intermittent, dull, and aching  In the last 24 hours, has pain interfered with the following? General activity 2 Relation with others 0 Enjoyment of life 3 What TIME of day is your pain at its worst? morning  Sleep (in general) Good  Pain is worse with: walking and standing Pain improves with: rest Relief from Meds:  Don't take pain meds  Family History  Problem Relation Age of Onset   Osteoporosis Mother    Stroke Mother        mini   Prostate cancer Father    Heart disease Brother 71       shunt put in heart   Osteoarthritis  Maternal Grandmother    Bipolar disorder Son    Liver disease Son    Breast cancer Cousin        1st cousin   Colon cancer Neg Hx    Social History   Socioeconomic History   Marital status: Married    Spouse name: [name]   Number of children: 3   Years of education: Not on file   Highest education level: Not on file  Occupational History   Occupation: retired  Tobacco Use   Smoking status: Never   Smokeless tobacco: Never  Vaping Use   Vaping Use: Never used  Substance and Sexual Activity   Alcohol use: Yes    Alcohol/week: 4.0 - 6.0 standard drinks of alcohol    Types: 4 - 6 Glasses of wine per week    Comment: occasional   Drug use: No   Sexual activity: Yes    Partners: Male    Birth control/protection: Surgical    Comment: TVH  Other Topics Concern   Not on file  Social History Narrative   Lives with husband and son   Left handed   Caffeine: 1 cup of coffee a day   Social Determinants of Radio broadcast assistant Strain: Not on file  Food  Insecurity: Not on file  Transportation Needs: Not on file  Physical Activity: Not on file  Stress: Not on file  Social Connections: Not on file   Past Surgical History:  Procedure Laterality Date   APPENDECTOMY     ATRIAL FIBRILLATION ABLATION N/A 01/28/2021   Procedure: [place];  Surgeon: Constance Haw, MD;  Location: [facility];  Service: Cardiovascular;  Laterality: N/A;   CARDIOVERSION N/A 10/30/2020   Procedure: CARDIOVERSION;  Surgeon: Jerline Pain, MD;  Location: [facility] ENDOSCOPY;  Service: Cardiovascular;  Laterality: N/A;   CARDIOVERSION N/A 03/16/2021   Procedure: CARDIOVERSION;  Surgeon: Thayer Headings, MD;  Location: [facility] ENDOSCOPY;  Service: Cardiovascular;  Laterality: N/A;   COLONOSCOPY     FOOT FRACTURE SURGERY  10/2013   TONSILLECTOMY AND ADENOIDECTOMY     TOTAL KNEE ARTHROPLASTY Left 03/22/2016   Procedure: TOTAL KNEE ARTHROPLASTY;  Surgeon: Dorna Leitz, MD;  Location: [place];  Service: Orthopedics;  Laterality: Left;   TUBAL LIGATION  1981   VAGINAL HYSTERECTOMY  04/20/05   prolapse, adenomyosis   Past Surgical History:  Procedure Laterality Date   APPENDECTOMY     ATRIAL FIBRILLATION ABLATION N/A 01/28/2021   Procedure: ATRIAL FIBRILLATION ABLATION;  Surgeon: Constance Haw, MD;  Location: [facility];  Service: Cardiovascular;  Laterality: N/A;   CARDIOVERSION N/A 10/30/2020   Procedure: CARDIOVERSION;  Surgeon: Jerline Pain, MD;  Location: [facility] ENDOSCOPY;  Service: Cardiovascular;  Laterality: N/A;   CARDIOVERSION N/A 03/16/2021   Procedure: CARDIOVERSION;  Surgeon: Thayer Headings, MD;  Location: [facility] ENDOSCOPY;  Service: Cardiovascular;  Laterality: N/A;   COLONOSCOPY     FOOT FRACTURE SURGERY  10/2013   TONSILLECTOMY AND ADENOIDECTOMY     TOTAL KNEE ARTHROPLASTY Left 03/22/2016   Procedure: TOTAL KNEE ARTHROPLASTY;  Surgeon: Dorna Leitz, MD;  Location: [place];  Service: Orthopedics;  Laterality: Left;   TUBAL LIGATION  1981   VAGINAL HYSTERECTOMY  04/20/05   prolapse, adenomyosis   Past Medical History:  Diagnosis Date   Abnormal uterine bleeding    on HRT   Allergy    seasonal   Arthritis    Atrial fibrillation (HCC)    Celiac disease    DDD (degenerative disc disease)    Dysrhythmia    GBS (Guillain-Barre syndrome) (HCC)    Glaucoma    Microscopic colitis    Osteoarthritis of both knees    PONV (postoperative nausea and vomiting)    SVT (supraventricular tachycardia)    BP 119/80   Pulse (!) 106   Ht '5\' 3"'$  (1.6 m)   Wt 140 lb (63.5 kg)   LMP 04/20/2005   SpO2 98%   BMI 24.80 kg/m   Opioid Risk Score:   Fall Risk Score:  `1  Depression screen [facility] 2/9     08/27/2022    9:08 AM 02/26/2022    9:10 AM 11/20/2021    9:08 AM 08/14/2021   10:18 AM 05/15/2021   11:37 AM  Depression screen PHQ 2/9  Decreased Interest 0 0 0 0 0  Down, Depressed, Hopeless 0 0 0 0 0  PHQ - 2 Score 0 0 0 0 0  Altered sleeping     2  Tired,  decreased energy     1  Change in appetite     0  Feeling bad or failure about yourself      1  Trouble concentrating     0  Moving slowly  or fidgety/restless     0  Suicidal thoughts     0  PHQ-9 Score     4  Difficult doing work/chores     Not difficult at all      Review of Systems  All other systems reviewed and are negative.     Objective:   Physical Exam Awake, alert, appropriate, NAD  MS: Deltoids 4+/5 B/L  Biceps, triceps, WE, grip and FA- 5-/5 - not limited by pain LE's- HF 4/5; KE/KF 5-/5; L DF 4+/5; R DF 5-/5; PF 5-/5  Gait-  A little almost scissoring as walks further- and then back and forth with broad based gait-   and then favors L leg as walks further-  R leg slightly shorter than L leg.  Less flexed posture at hip- standing up straighter  Assessment & Plan:   Pt is a 77 yr old L handed female with hx of Guillain Barre Syndrome/AIDP with B/L foot drop- onset 02/05/21. 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 Re for f/u on GBS. 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 Suggest Lovenox to prevent stroke if does this- while waiting for surgery. Would need hand surgeon.   2.  Hasn't had good results with lidocaine for hand pain    3. Try stopping Gabapentin for 1 week- see how it goes- if Symptoms get worse, then just restart, but can go 7 days before deciding as well.  If needs ot stay on it, can see me 1x/year and I can refill or have PCP take over Rx.    4.  At maximal improvement from GBS- still has B/L weakness in shoulders and hips which could from leftover form GBS or due to spinal issues- hard to tease  out.   5.  F/U as needed- if needed Discussed that I've only seen only 2 cases of GBS occur again, so it's rare.     I spent a total of  31  minutes on total care today- >50% coordination of care- due to discussion about long term prognosis and weaning gabapentin,

## 2022-08-30 DIAGNOSIS — Z1231 Encounter for screening mammogram for malignant neoplasm of breast: Secondary | ICD-10-CM | POA: Diagnosis not present

## 2022-10-26 DIAGNOSIS — H40053 Ocular hypertension, bilateral: Secondary | ICD-10-CM | POA: Diagnosis not present

## 2022-11-09 DIAGNOSIS — M810 Age-related osteoporosis without current pathological fracture: Secondary | ICD-10-CM | POA: Diagnosis not present

## 2022-11-09 DIAGNOSIS — E785 Hyperlipidemia, unspecified: Secondary | ICD-10-CM | POA: Diagnosis not present

## 2022-11-12 DIAGNOSIS — Z8669 Personal history of other diseases of the nervous system and sense organs: Secondary | ICD-10-CM | POA: Diagnosis not present

## 2022-11-12 DIAGNOSIS — H9193 Unspecified hearing loss, bilateral: Secondary | ICD-10-CM | POA: Diagnosis not present

## 2022-11-12 DIAGNOSIS — I2510 Atherosclerotic heart disease of native coronary artery without angina pectoris: Secondary | ICD-10-CM | POA: Diagnosis not present

## 2022-11-12 DIAGNOSIS — I480 Paroxysmal atrial fibrillation: Secondary | ICD-10-CM | POA: Diagnosis not present

## 2022-11-12 DIAGNOSIS — E039 Hypothyroidism, unspecified: Secondary | ICD-10-CM | POA: Diagnosis not present

## 2022-11-12 DIAGNOSIS — M85852 Other specified disorders of bone density and structure, left thigh: Secondary | ICD-10-CM | POA: Diagnosis not present

## 2022-11-12 DIAGNOSIS — I700 Atherosclerosis of aorta: Secondary | ICD-10-CM | POA: Diagnosis not present

## 2022-11-12 DIAGNOSIS — D6869 Other thrombophilia: Secondary | ICD-10-CM | POA: Diagnosis not present

## 2022-11-12 DIAGNOSIS — J309 Allergic rhinitis, unspecified: Secondary | ICD-10-CM | POA: Diagnosis not present

## 2022-11-12 DIAGNOSIS — K52832 Lymphocytic colitis: Secondary | ICD-10-CM | POA: Diagnosis not present

## 2022-11-12 DIAGNOSIS — M19049 Primary osteoarthritis, unspecified hand: Secondary | ICD-10-CM | POA: Diagnosis not present

## 2022-11-12 DIAGNOSIS — Z0000 Encounter for general adult medical examination without abnormal findings: Secondary | ICD-10-CM | POA: Diagnosis not present

## 2023-01-06 ENCOUNTER — Ambulatory Visit (HOSPITAL_COMMUNITY)
Admission: RE | Admit: 2023-01-06 | Discharge: 2023-01-06 | Disposition: A | Discharge status: Home / Self Care | Payer: Medicare Other | Source: Ambulatory Visit | Attending: Physician Assistant | Admitting: Physician Assistant

## 2023-01-06 ENCOUNTER — Inpatient Hospital Stay (HOSPITAL_COMMUNITY)
Admission: RE | Admit: 2023-01-06 | Discharge: 2023-01-06 | Disposition: A | Discharge status: Home / Self Care | Payer: Medicare Other | Source: Ambulatory Visit | Attending: Physician Assistant | Admitting: Physician Assistant

## 2023-01-06 ENCOUNTER — Encounter (HOSPITAL_COMMUNITY): Payer: Self-pay | Admitting: Physician Assistant

## 2023-01-06 VITALS — BP 114/74 | HR 105 | Ht 63.0 in | Wt 138.2 lb

## 2023-01-06 DIAGNOSIS — I480 Paroxysmal atrial fibrillation: Secondary | ICD-10-CM | POA: Diagnosis not present

## 2023-01-06 DIAGNOSIS — I4819 Other persistent atrial fibrillation: Secondary | ICD-10-CM | POA: Insufficient documentation

## 2023-01-06 DIAGNOSIS — I4892 Unspecified atrial flutter: Secondary | ICD-10-CM | POA: Insufficient documentation

## 2023-01-06 DIAGNOSIS — Z7901 Long term (current) use of anticoagulants: Secondary | ICD-10-CM | POA: Insufficient documentation

## 2023-01-06 DIAGNOSIS — I4439 Other atrioventricular block: Secondary | ICD-10-CM | POA: Diagnosis not present

## 2023-01-06 DIAGNOSIS — D6869 Other thrombophilia: Secondary | ICD-10-CM | POA: Diagnosis not present

## 2023-01-06 DIAGNOSIS — I484 Atypical atrial flutter: Secondary | ICD-10-CM

## 2023-01-06 MED ORDER — METOPROLOL SUCCINATE ER 25 MG PO TB24: 25.0000 mg | ORAL_TABLET | Freq: Every day | ORAL | 3 refills | Status: DC

## 2023-01-06 NOTE — Progress Notes (Signed)
Primary Care Physician: Merri Brunette, MD Primary Cardiologist: Dr Royann Shivers  Primary Electrophysiologist: Dr Elberta Fortis  Referring Physician: Dr Ronita Hipps is a 77 y.o. female with a history of celiac disease, glaucoma, and atrial fibrillation who presents for follow up in the [facility].  The patient was initially diagnosed with atrial fibrillation in 2018 in the setting of an acute GI illness which was later diagnosed as Celiac disease. Patient is on Eliquis for a CHADS2VASC score of 3. She developed persistent afib and underwent DCCV on 10/30/20 which was unsuccessful. Seen by Dr Elberta Fortis and underwent afib ablation 01/28/21.  One week after her ablation she presents to the hospital with progressive paralysis. She was diagnosed with Guillain-Barr syndrome. She had prolonged hospitalization with rehab. She presented to rehab in atrial flutter and is status post DCCV.  On follow up today, patient reports that she has felt well since her last visit. She is surprised to hear she is in atrial flutter. She is asymptomatic, no specific triggers that she could identify. No bleeding issues on anticoagulation.   Today, she denies symptoms of palpitations, chest pain, orthopnea, PND, lower extremity edema, dizziness, presyncope, syncope, snoring, daytime somnolence, bleeding, or neurologic sequela. The patient is tolerating medications without difficulties and is otherwise without complaint today.    Atrial Fibrillation Risk Factors:  she does not have symptoms or diagnosis of sleep apnea. she does not have a history of rheumatic fever.   Atrial Fibrillation Management history:  Previous antiarrhythmic drugs: none Previous cardioversions: 10/30/20, 03/16/21 Previous ablations: 01/28/21 Anticoagulation history: Elilquis   Past Medical History:  Diagnosis Date   Abnormal uterine bleeding    on HRT   Allergy    seasonal   Arthritis    Atrial  fibrillation (HCC)    Celiac disease    DDD (degenerative disc disease)    Dysrhythmia    GBS (Guillain-Barre syndrome) (HCC)    Glaucoma    Microscopic colitis    Osteoarthritis of both knees    PONV (postoperative nausea and vomiting)    SVT (supraventricular tachycardia)     ROS- All systems are reviewed and negative except as per the HPI above.  Physical Exam: Vitals:   01/06/23 0939  BP: 114/74  Pulse: (!) 105  Weight: 62.7 kg  Height: 5\' 3"  (1.6 m)     GEN: Well nourished, well developed in no acute distress NECK: No JVD; No carotid bruits CARDIAC: Irregularly irregular rate and rhythm, no murmurs, rubs, gallops RESPIRATORY:  Clear to auscultation without rales, wheezing or rhonchi  ABDOMEN: Soft, non-tender, non-distended EXTREMITIES:  No edema; No deformity    Wt Readings from Last 3 Encounters:  01/06/23 62.7 kg  08/27/22 63.5 kg  07/07/22 63.6 kg    EKG today demonstrates  Atypical atrial flutter with variable block Vent. rate 105 BPM PR interval * ms QRS duration 66 ms QT/QTcB 316/417 ms  Echo 12/04/20 demonstrated   1. Left ventricular ejection fraction, by estimation, is 55%. The left  ventricle has normal function. The left ventricle has no regional wall  motion abnormalities. Left ventricular diastolic parameters were grossly  normal.   2. Right ventricular systolic function is normal. The right ventricular  size is mildly enlarged. There is normal pulmonary artery systolic  pressure. The estimated right ventricular systolic pressure is 25.7 mmHg.   3. Left atrial size was moderately dilated.   4. Right atrial size was mild to moderately dilated.  5. The mitral valve is normal in structure. Mild mitral valve  regurgitation. No evidence of mitral stenosis.   6. Tricuspid valve regurgitation is mild to moderate.   7. The aortic valve is grossly normal. There is mild calcification of the  aortic valve. Aortic valve regurgitation is not visualized.  No aortic  stenosis is present.   8. The inferior vena cava is normal in size with greater than 50%  respiratory variability, suggesting right atrial pressure of 3 mmHg.    Epic records are reviewed at length today  CHA2DS2-VASc Score = 3  The patient's score is based upon: CHF History: 0 HTN History: 0 Diabetes History: 0 Stroke History: 0 Vascular Disease History: 0 Age Score: 2 Gender Score: 1       ASSESSMENT AND PLAN: Persistent Atrial Fibrillation/atrial flutter The patient's CHA2DS2-VASc score is 3, indicating a 3.2% annual risk of stroke.   S/p afib ablation 01/28/21 Patient in atrial flutter today, unclear duration. We discussed rhythm control options. Will have her wear a 7 day monitor to evaluate if she is persistent or paroxysmal. If persistent, will arrange for DCCV. Resume Toprol 25 mg daily Continue Eliquis 5 mg BID  Secondary Hypercoagulable State (ICD10:  D68.69) The patient is at significant risk for stroke/thromboembolism based upon her CHA2DS2-VASc Score of 3.  Continue Apixaban (Eliquis).    Follow up in the AF clinic pending monitor results.    Jorja Loa PA-C Af[facility] Clinic [facility] [address] [place], [place] [address] [phone] 01/06/2023 10:02 AM

## 2023-01-06 NOTE — Patient Instructions (Addendum)
Start Metoprolol 25mg once a day 

## 2023-01-06 NOTE — H&P (View-Only) (Signed)
Primary Care Physician: Merri Brunette, MD Primary Cardiologist: Dr Royann Shivers  Primary Electrophysiologist: Dr Elberta Fortis  Referring Physician: Dr Ronita Hipps is a 77 y.o. female with a history of celiac disease, glaucoma, and atrial fibrillation who presents for follow up in the [facility].  The patient was initially diagnosed with atrial fibrillation in 2018 in the setting of an acute GI illness which was later diagnosed as Celiac disease. Patient is on Eliquis for a CHADS2VASC score of 3. She developed persistent afib and underwent DCCV on 10/30/20 which was unsuccessful. Seen by Dr Elberta Fortis and underwent afib ablation 01/28/21.  One week after her ablation she presents to the hospital with progressive paralysis. She was diagnosed with Guillain-Barr syndrome. She had prolonged hospitalization with rehab. She presented to rehab in atrial flutter and is status post DCCV.  On follow up today, patient reports that she has felt well since her last visit. She is surprised to hear she is in atrial flutter. She is asymptomatic, no specific triggers that she could identify. No bleeding issues on anticoagulation.   Today, she denies symptoms of palpitations, chest pain, orthopnea, PND, lower extremity edema, dizziness, presyncope, syncope, snoring, daytime somnolence, bleeding, or neurologic sequela. The patient is tolerating medications without difficulties and is otherwise without complaint today.    Atrial Fibrillation Risk Factors:  she does not have symptoms or diagnosis of sleep apnea. she does not have a history of rheumatic fever.   Atrial Fibrillation Management history:  Previous antiarrhythmic drugs: none Previous cardioversions: 10/30/20, 03/16/21 Previous ablations: 01/28/21 Anticoagulation history: Elilquis   Past Medical History:  Diagnosis Date   Abnormal uterine bleeding    on HRT   Allergy    seasonal   Arthritis    Atrial  fibrillation (HCC)    Celiac disease    DDD (degenerative disc disease)    Dysrhythmia    GBS (Guillain-Barre syndrome) (HCC)    Glaucoma    Microscopic colitis    Osteoarthritis of both knees    PONV (postoperative nausea and vomiting)    SVT (supraventricular tachycardia)     ROS- All systems are reviewed and negative except as per the HPI above.  Physical Exam: Vitals:   01/06/23 0939  BP: 114/74  Pulse: (!) 105  Weight: 62.7 kg  Height: 5\' 3"  (1.6 m)     GEN: Well nourished, well developed in no acute distress NECK: No JVD; No carotid bruits CARDIAC: Irregularly irregular rate and rhythm, no murmurs, rubs, gallops RESPIRATORY:  Clear to auscultation without rales, wheezing or rhonchi  ABDOMEN: Soft, non-tender, non-distended EXTREMITIES:  No edema; No deformity    Wt Readings from Last 3 Encounters:  01/06/23 62.7 kg  08/27/22 63.5 kg  07/07/22 63.6 kg    EKG today demonstrates  Atypical atrial flutter with variable block Vent. rate 105 BPM PR interval * ms QRS duration 66 ms QT/QTcB 316/417 ms  Echo 12/04/20 demonstrated   1. Left ventricular ejection fraction, by estimation, is 55%. The left  ventricle has normal function. The left ventricle has no regional wall  motion abnormalities. Left ventricular diastolic parameters were grossly  normal.   2. Right ventricular systolic function is normal. The right ventricular  size is mildly enlarged. There is normal pulmonary artery systolic  pressure. The estimated right ventricular systolic pressure is 25.7 mmHg.   3. Left atrial size was moderately dilated.   4. Right atrial size was mild to moderately dilated.  5. The mitral valve is normal in structure. Mild mitral valve  regurgitation. No evidence of mitral stenosis.   6. Tricuspid valve regurgitation is mild to moderate.   7. The aortic valve is grossly normal. There is mild calcification of the  aortic valve. Aortic valve regurgitation is not visualized.  No aortic  stenosis is present.   8. The inferior vena cava is normal in size with greater than 50%  respiratory variability, suggesting right atrial pressure of 3 mmHg.    Epic records are reviewed at length today  CHA2DS2-VASc Score = 3  The patient's score is based upon: CHF History: 0 HTN History: 0 Diabetes History: 0 Stroke History: 0 Vascular Disease History: 0 Age Score: 2 Gender Score: 1       ASSESSMENT AND PLAN: Persistent Atrial Fibrillation/atrial flutter The patient's CHA2DS2-VASc score is 3, indicating a 3.2% annual risk of stroke.   S/p afib ablation 01/28/21 Patient in atrial flutter today, unclear duration. We discussed rhythm control options. Will have her wear a 7 day monitor to evaluate if she is persistent or paroxysmal. If persistent, will arrange for DCCV. Resume Toprol 25 mg daily Continue Eliquis 5 mg BID  Secondary Hypercoagulable State (ICD10:  D68.69) The patient is at significant risk for stroke/thromboembolism based upon her CHA2DS2-VASc Score of 3.  Continue Apixaban (Eliquis).    Follow up in the AF clinic pending monitor results.    Jorja Loa PA-C [facility] [address] [place], [place] [address] [phone] 01/06/2023 10:02 AM

## 2023-01-20 DIAGNOSIS — I480 Paroxysmal atrial fibrillation: Secondary | ICD-10-CM | POA: Diagnosis not present

## 2023-01-26 ENCOUNTER — Ambulatory Visit (HOSPITAL_COMMUNITY)
Admission: RE | Admit: 2023-01-26 | Discharge: 2023-01-26 | Disposition: A | Discharge status: Home / Self Care | Payer: Medicare Other | Source: Ambulatory Visit | Attending: Physician Assistant | Admitting: Physician Assistant

## 2023-01-26 DIAGNOSIS — I4891 Unspecified atrial fibrillation: Secondary | ICD-10-CM | POA: Insufficient documentation

## 2023-01-26 LAB — CBC
HCT: 45.4 % (ref 36.0–46.0)
Hemoglobin: 14.9 g/dL (ref 12.0–15.0)
MCH: 33 pg (ref 26.0–34.0)
MCHC: 32.8 g/dL (ref 30.0–36.0)
MCV: 100.4 fL — ABNORMAL HIGH (ref 80.0–100.0)
Platelets: 180 10*3/uL (ref 150–400)
RBC: 4.52 MIL/uL (ref 3.87–5.11)
RDW: 13.6 % (ref 11.5–15.5)
WBC: 6 10*3/uL (ref 4.0–10.5)
nRBC: 0 % (ref 0.0–0.2)

## 2023-01-26 LAB — BASIC METABOLIC PANEL
Anion gap: 11 (ref 5–15)
BUN: 10 mg/dL (ref 8–23)
CO2: 27 mmol/L (ref 22–32)
Calcium: 9.5 mg/dL (ref 8.9–10.3)
Chloride: 101 mmol/L (ref 98–111)
Creatinine, Ser: 0.67 mg/dL (ref 0.44–1.00)
GFR, Estimated: >60 mL/min (ref >60)
Glucose, Bld: 93 mg/dL (ref 70–99)
Potassium: 5.3 mmol/L — ABNORMAL HIGH (ref 3.5–5.1)
Sodium: 139 mmol/L (ref 135–145)

## 2023-01-26 NOTE — Patient Instructions (Addendum)
Cardioversion scheduled for: August 5th    - Arrive at the [facility] and go to admitting at 9:00am   - Do not eat or drink anything after midnight the night prior to your procedure.   - Take all your morning medication (except diabetic medications) with a sip of water prior to arrival.  - You will not be able to drive home after your procedure.    - Do NOT miss any doses of your blood thinner - if you should miss a dose please notify our office immediately.   - If you feel as if you go back into normal rhythm prior to scheduled cardioversion, please notify our office immediately.   If your procedure is canceled in the cardioversion suite you will be charged a cancellation fee.

## 2023-01-28 NOTE — Pre-Procedure Instructions (Signed)
Spoke to patient on phone regarding procedure on Monday.  Instructed to arrive at 6:45 am, NPO after midnight.  Confirmed patient has a ride home and responsible person to stay with patient for 24 hours after the procedure  Confirmed no missed doses of Eliquis, instructed patient to take the morning of surgery with a sip of water.  Take BP meds in the AM with a sip of water

## 2023-01-30 NOTE — Anesthesia Preprocedure Evaluation (Signed)
Anesthesia Evaluation  Patient identified by MRN, date of birth, ID band  Reviewed: Allergy & Precautions, NPO status , Patient's Chart, lab work & pertinent test results  History of Anesthesia Complications (+) PONV and history of anesthetic complications  Airway Mallampati: II  TM Distance: >3 FB Neck ROM: Full    Dental  (+) Dental Advisory Given, Teeth Intact   Pulmonary  S.p trach   Pulmonary exam normal breath sounds clear to auscultation       Cardiovascular + dysrhythmias Atrial Fibrillation + Valvular Problems/Murmurs (Mild-mod TR)  Rhythm:Irregular Rate:Normal  Echo 11/2020   1. Left ventricular ejection fraction, by estimation, is 55%. The left ventricle has normal function. The left ventricle has no regional wall motion abnormalities. Left ventricular diastolic parameters were grossly normal.   2. Right ventricular systolic function is normal. The right ventricular size is mildly enlarged. There is normal pulmonary artery systolic pressure. The estimated right ventricular systolic pressure is 25.7 mmHg.   3. Left atrial size was moderately dilated.   4. Right atrial size was mild to moderately dilated.   5. The mitral valve is normal in structure. Mild mitral valve regurgitation. No evidence of mitral stenosis.   6. Tricuspid valve regurgitation is mild to moderate.   7. The aortic valve is grossly normal. There is mild calcification of the aortic valve. Aortic valve regurgitation is not visualized. No aortic stenosis is present.   8. The inferior vena cava is normal in size with greater than 50% respiratory variability, suggesting right atrial pressure of 3 mmHg.     Neuro/Psych GBS (Guillain-Barre syndrome)   Neuromuscular disease    GI/Hepatic negative GI ROS, Neg liver ROS,,,  Endo/Other  negative endocrine ROS    Renal/GU negative Renal ROS     Musculoskeletal  (+) Arthritis ,    Abdominal   Peds   Hematology negative hematology ROS (+)   Anesthesia Other Findings A-fib  Reproductive/Obstetrics                             Anesthesia Physical Anesthesia Plan  ASA: 3  Anesthesia Plan: General   Post-op Pain Management: Minimal or no pain anticipated   Induction: Intravenous  PONV Risk Score and Plan: 4 or greater and Propofol infusion, Treatment may vary due to age or medical condition and TIVA  Airway Management Planned: Natural Airway  Additional Equipment:   Intra-op Plan:   Post-operative Plan:   Informed Consent: I have reviewed the patients History and Physical, chart, labs and discussed the procedure including the risks, benefits and alternatives for the proposed anesthesia with the patient or authorized representative who has indicated his/her understanding and acceptance.     Dental advisory given  Plan Discussed with: CRNA  Anesthesia Plan Comments:         Anesthesia Quick Evaluation

## 2023-01-31 ENCOUNTER — Encounter (HOSPITAL_COMMUNITY): Payer: Self-pay | Admitting: Cardiology

## 2023-01-31 ENCOUNTER — Ambulatory Visit (HOSPITAL_COMMUNITY): Admission: RE | Admit: 2023-01-31 | Payer: Medicare Other | Source: Home / Self Care | Admitting: Cardiology

## 2023-01-31 ENCOUNTER — Other Ambulatory Visit: Payer: Self-pay

## 2023-01-31 ENCOUNTER — Ambulatory Visit (HOSPITAL_BASED_OUTPATIENT_CLINIC_OR_DEPARTMENT_OTHER): Payer: Medicare Other | Admitting: Anesthesiology

## 2023-01-31 ENCOUNTER — Encounter (HOSPITAL_COMMUNITY)
Admission: RE | Disposition: A | Discharge status: Home / Self Care | Payer: Self-pay | Source: Home / Self Care | Attending: Cardiology

## 2023-01-31 ENCOUNTER — Ambulatory Visit (HOSPITAL_COMMUNITY): Payer: Medicare Other | Admitting: Anesthesiology

## 2023-01-31 DIAGNOSIS — Z7901 Long term (current) use of anticoagulants: Secondary | ICD-10-CM | POA: Diagnosis not present

## 2023-01-31 DIAGNOSIS — I4892 Unspecified atrial flutter: Secondary | ICD-10-CM

## 2023-01-31 DIAGNOSIS — I4819 Other persistent atrial fibrillation: Secondary | ICD-10-CM | POA: Diagnosis not present

## 2023-01-31 DIAGNOSIS — I4891 Unspecified atrial fibrillation: Secondary | ICD-10-CM

## 2023-01-31 DIAGNOSIS — I499 Cardiac arrhythmia, unspecified: Secondary | ICD-10-CM | POA: Diagnosis not present

## 2023-01-31 DIAGNOSIS — I480 Paroxysmal atrial fibrillation: Secondary | ICD-10-CM

## 2023-01-31 DIAGNOSIS — E871 Hypo-osmolality and hyponatremia: Secondary | ICD-10-CM | POA: Diagnosis not present

## 2023-01-31 DIAGNOSIS — Z79899 Other long term (current) drug therapy: Secondary | ICD-10-CM | POA: Diagnosis not present

## 2023-01-31 DIAGNOSIS — D6869 Other thrombophilia: Secondary | ICD-10-CM | POA: Diagnosis not present

## 2023-01-31 DIAGNOSIS — J9601 Acute respiratory failure with hypoxia: Secondary | ICD-10-CM | POA: Diagnosis not present

## 2023-01-31 DIAGNOSIS — H409 Unspecified glaucoma: Secondary | ICD-10-CM | POA: Insufficient documentation

## 2023-01-31 DIAGNOSIS — K900 Celiac disease: Secondary | ICD-10-CM | POA: Diagnosis not present

## 2023-01-31 HISTORY — PX: CARDIOVERSION: SHX1299

## 2023-01-31 SURGERY — CARDIOVERSION
Anesthesia: General

## 2023-01-31 MED ORDER — SODIUM CHLORIDE 0.9 % IV SOLN: INTRAVENOUS | Status: DC

## 2023-01-31 MED ORDER — PROPOFOL 10 MG/ML IV BOLUS
INTRAVENOUS | Status: DC | PRN
  Administered 2023-01-31: 50 mg via INTRAVENOUS

## 2023-01-31 SURGICAL SUPPLY — 1 items: ELECT DEFIB PAD ADLT CADENCE (PAD) ×1 IMPLANT

## 2023-01-31 NOTE — CV Procedure (Signed)
    Electrical Cardioversion Procedure Note Angela Morales [id number] [birth year]  Procedure: Electrical Cardioversion Indications:  Atrial Flutter  Time Out: Verified patient identification, verified procedure,medications/allergies/relevent history reviewed, required imaging and test results available.  Performed  Procedure Details  The patient was NPO after midnight. Anesthesia was administered at the beside  by Dr. Sampson Goon with propofol.  Cardioversion was performed with synchronized biphasic defibrillation via AP pads with 120 joules.  1 attempt(s) were performed.  The patient converted to normal sinus rhythm. The patient tolerated the procedure well   IMPRESSION:  Successful cardioversion of atrial flutter    Angela Morales 01/31/2023, 8:03 AM

## 2023-01-31 NOTE — Interval H&P Note (Signed)
History and Physical Interval Note:  01/31/2023 7:40 AM  Angela Morales  has presented today for surgery, with the diagnosis of AFIB.  The various methods of treatment have been discussed with the patient and family. After consideration of risks, benefits and other options for treatment, the patient has consented to  Procedure(s): CARDIOVERSION (N/A) as a surgical intervention.  The patient's history has been reviewed, patient examined, no change in status, stable for surgery.  I have reviewed the patient's chart and labs.  Questions were answered to the patient's satisfaction.     [employer]

## 2023-01-31 NOTE — Anesthesia Postprocedure Evaluation (Signed)
Anesthesia Post Note  Patient: Angela Morales  Procedure(s) Performed: CARDIOVERSION     Patient location during evaluation: PACU Anesthesia Type: General Level of consciousness: sedated and patient cooperative Pain management: pain level controlled Vital Signs Assessment: post-procedure vital signs reviewed and stable Respiratory status: spontaneous breathing Cardiovascular status: stable Anesthetic complications: no   No notable events documented.  Last Vitals:  Vitals:   01/31/23 0830 01/31/23 0835  BP: (!) 101/59 97/64  Pulse: (!) 56 (!) 57  Resp: 10 13  Temp:    SpO2: 98% 99%    Last Pain:  Vitals:   01/31/23 0835  TempSrc:   PainSc: 0-No pain                 Lewie Loron

## 2023-01-31 NOTE — Transfer of Care (Signed)
Immediate Anesthesia Transfer of Care Note  Patient: Angela Morales  Procedure(s) Performed: CARDIOVERSION  Patient Location: PACU  Anesthesia Type:General  Level of Consciousness: drowsy and patient cooperative  Airway & Oxygen Therapy: Patient Spontanous Breathing and Patient connected to face mask oxygen  Post-op Assessment: Report given to RN  Post vital signs: Reviewed and stable  Last Vitals:  Vitals Value Taken Time  BP    Temp    Pulse 88 01/31/23 0749  Resp 15 01/31/23 0749  SpO2 100 % 01/31/23 0749  Vitals shown include unfiled device data.  Last Pain:  Vitals:   01/31/23 0704  TempSrc: Temporal  PainSc: 0-No pain         Complications: No notable events documented.

## 2023-02-01 ENCOUNTER — Encounter (HOSPITAL_COMMUNITY): Payer: Self-pay | Admitting: Cardiology

## 2023-02-10 ENCOUNTER — Ambulatory Visit (HOSPITAL_COMMUNITY)
Admission: RE | Admit: 2023-02-10 | Discharge: 2023-02-10 | Disposition: A | Discharge status: Home / Self Care | Payer: Medicare Other | Source: Ambulatory Visit | Attending: Physician Assistant | Admitting: Physician Assistant

## 2023-02-10 ENCOUNTER — Encounter (HOSPITAL_COMMUNITY): Payer: Self-pay | Admitting: Physician Assistant

## 2023-02-10 VITALS — BP 122/76 | HR 95 | Ht 63.0 in | Wt 138.0 lb

## 2023-02-10 DIAGNOSIS — K900 Celiac disease: Secondary | ICD-10-CM | POA: Diagnosis not present

## 2023-02-10 DIAGNOSIS — D6869 Other thrombophilia: Secondary | ICD-10-CM | POA: Insufficient documentation

## 2023-02-10 DIAGNOSIS — I2510 Atherosclerotic heart disease of native coronary artery without angina pectoris: Secondary | ICD-10-CM | POA: Insufficient documentation

## 2023-02-10 DIAGNOSIS — I4819 Other persistent atrial fibrillation: Secondary | ICD-10-CM | POA: Insufficient documentation

## 2023-02-10 DIAGNOSIS — Z7901 Long term (current) use of anticoagulants: Secondary | ICD-10-CM | POA: Diagnosis not present

## 2023-02-10 DIAGNOSIS — I484 Atypical atrial flutter: Secondary | ICD-10-CM | POA: Insufficient documentation

## 2023-02-10 DIAGNOSIS — H409 Unspecified glaucoma: Secondary | ICD-10-CM | POA: Insufficient documentation

## 2023-02-10 DIAGNOSIS — Z79899 Other long term (current) drug therapy: Secondary | ICD-10-CM | POA: Diagnosis not present

## 2023-02-10 MED ORDER — METOPROLOL SUCCINATE ER 25 MG PO TB24: 25.0000 mg | ORAL_TABLET | Freq: Two times a day (BID) | ORAL | 1 refills | Status: DC

## 2023-02-10 NOTE — Patient Instructions (Signed)
Increase Metoprolol 25 mg - Take 1 tablet by mouth twice daily

## 2023-02-10 NOTE — Progress Notes (Signed)
Primary Care Physician: Merri Brunette, MD Primary Cardiologist: Dr Royann Shivers  Primary Electrophysiologist: Dr Elberta Fortis  Referring Physician: Dr Ronita Hipps is a 77 y.o. female with a history of celiac disease, glaucoma, and atrial fibrillation who presents for follow up in the [facility].  The patient was initially diagnosed with atrial fibrillation in 2018 in the setting of an acute GI illness which was later diagnosed as Celiac disease. Patient is on Eliquis for a CHADS2VASC score of 3. She developed persistent afib and underwent DCCV on 10/30/20 which was unsuccessful. Seen by Dr Elberta Fortis and underwent afib ablation 01/28/21.  One week after her ablation she presents to the hospital with progressive paralysis. She was diagnosed with Guillain-Barr syndrome. She had prolonged hospitalization with rehab. She presented to rehab in atrial flutter and is status post DCCV. At her routine follow up on 01/06/23 she was found to be in atrial flutter. Zio monitor showed 100% burden. She underwent DCCV on 01/31/23.  On follow up today, patient is back in atrial flutter. She is unaware of her arrhythmia but did have some chest wall soreness following her DCCV. There were no specific triggers that she could identify.   Today, she denies symptoms of palpitations, orthopnea, PND, lower extremity edema, dizziness, presyncope, syncope, snoring, daytime somnolence, bleeding, or neurologic sequela. The patient is tolerating medications without difficulties and is otherwise without complaint today.    Atrial Fibrillation Risk Factors:  she does not have symptoms or diagnosis of sleep apnea. she does not have a history of rheumatic fever.   Atrial Fibrillation Management history:  Previous antiarrhythmic drugs: amiodarone  Previous cardioversions: 10/30/20, 03/16/21, 01/31/23 Previous ablations: 01/28/21 Anticoagulation history: Elilquis   Past Medical History:   Diagnosis Date   Abnormal uterine bleeding    on HRT   Allergy    seasonal   Arthritis    Atrial fibrillation (HCC)    Celiac disease    DDD (degenerative disc disease)    Dysrhythmia    GBS (Guillain-Barre syndrome) (HCC)    Glaucoma    Microscopic colitis    Osteoarthritis of both knees    PONV (postoperative nausea and vomiting)    SVT (supraventricular tachycardia)     ROS- All systems are reviewed and negative except as per the HPI above.  Physical Exam: Vitals:   02/10/23 1019  BP: 122/76  Pulse: 95  Weight: 62.6 kg  Height: 5\' 3"  (1.6 m)    GEN: Well nourished, well developed in no acute distress NECK: No JVD; No carotid bruits CARDIAC: Irregularly irregular rate and rhythm, no murmurs, rubs, gallops RESPIRATORY:  Clear to auscultation without rales, wheezing or rhonchi  ABDOMEN: Soft, non-tender, non-distended EXTREMITIES:  No edema; No deformity    Wt Readings from Last 3 Encounters:  02/10/23 62.6 kg  01/31/23 62.6 kg  01/06/23 62.7 kg    EKG today demonstrates  Atypical atrial flutter with variable block Vent. rate 95 BPM PR interval 320 ms QRS duration 72 ms QT/QTcB 338/424 ms  Echo 12/04/20 demonstrated   1. Left ventricular ejection fraction, by estimation, is 55%. The left  ventricle has normal function. The left ventricle has no regional wall  motion abnormalities. Left ventricular diastolic parameters were grossly  normal.   2. Right ventricular systolic function is normal. The right ventricular  size is mildly enlarged. There is normal pulmonary artery systolic  pressure. The estimated right ventricular systolic pressure is 25.7 mmHg.  3. Left atrial size was moderately dilated.   4. Right atrial size was mild to moderately dilated.   5. The mitral valve is normal in structure. Mild mitral valve  regurgitation. No evidence of mitral stenosis.   6. Tricuspid valve regurgitation is mild to moderate.   7. The aortic valve is grossly  normal. There is mild calcification of the  aortic valve. Aortic valve regurgitation is not visualized. No aortic  stenosis is present.   8. The inferior vena cava is normal in size with greater than 50%  respiratory variability, suggesting right atrial pressure of 3 mmHg.    Epic records are reviewed at length today  CHA2DS2-VASc Score = 3  The patient's score is based upon: CHF History: 0 HTN History: 0 Diabetes History: 0 Stroke History: 0 Vascular Disease History: 0 Age Score: 2 Gender Score: 1       ASSESSMENT AND PLAN: Persistent Atrial Fibrillation/atrial flutter The patient's CHA2DS2-VASc score is 3, indicating a 3.2% annual risk of stroke.   S/p afib ablation 01/28/21 S/p DCCV 01/31/23 with quick return of atrial flutter We discussed rate vs rhythm control today. Could consider dofetilide or amiodarone or repeat ablation. She is understandably hesitant about another ablation given the proximity of her GBS diagnosis to her previous ablation. She would prefer to try a higher dose of BB first. Information sheet on dofetilide given today.  Increase Toprol to 25 mg BID Continue Eliquis 5 mg BID  Secondary Hypercoagulable State (ICD10:  D68.69) The patient is at significant risk for stroke/thromboembolism based upon her CHA2DS2-VASc Score of 3.  Continue Apixaban (Eliquis).   CAD CAC score 282 on CT No anginal symptoms   Follow up in the AF clinic in 3 weeks.    Jorja Loa PA-C Afib Clinic [facility] [address] [place], [place] [address] [phone] 02/10/2023 10:32 AM

## 2023-02-22 DIAGNOSIS — H40053 Ocular hypertension, bilateral: Secondary | ICD-10-CM | POA: Diagnosis not present

## 2023-03-03 ENCOUNTER — Ambulatory Visit (HOSPITAL_COMMUNITY)
Admission: RE | Admit: 2023-03-03 | Discharge: 2023-03-03 | Disposition: A | Discharge status: Home / Self Care | Payer: Medicare Other | Source: Ambulatory Visit | Attending: Physician Assistant | Admitting: Physician Assistant

## 2023-03-03 ENCOUNTER — Encounter (HOSPITAL_COMMUNITY): Payer: Self-pay | Admitting: Physician Assistant

## 2023-03-03 VITALS — BP 110/80 | HR 101 | Ht 63.0 in | Wt 137.0 lb

## 2023-03-03 DIAGNOSIS — I2510 Atherosclerotic heart disease of native coronary artery without angina pectoris: Secondary | ICD-10-CM | POA: Insufficient documentation

## 2023-03-03 DIAGNOSIS — I484 Atypical atrial flutter: Secondary | ICD-10-CM | POA: Diagnosis not present

## 2023-03-03 DIAGNOSIS — I4892 Unspecified atrial flutter: Secondary | ICD-10-CM | POA: Diagnosis not present

## 2023-03-03 DIAGNOSIS — Z7901 Long term (current) use of anticoagulants: Secondary | ICD-10-CM | POA: Insufficient documentation

## 2023-03-03 DIAGNOSIS — D6869 Other thrombophilia: Secondary | ICD-10-CM | POA: Insufficient documentation

## 2023-03-03 DIAGNOSIS — I4819 Other persistent atrial fibrillation: Secondary | ICD-10-CM | POA: Diagnosis not present

## 2023-03-03 DIAGNOSIS — Z79899 Other long term (current) drug therapy: Secondary | ICD-10-CM | POA: Diagnosis not present

## 2023-03-03 LAB — TSH: TSH: 0.943 u[IU]/mL (ref 0.350–4.500)

## 2023-03-03 MED ORDER — AMIODARONE HCL 200 MG PO TABS: ORAL_TABLET | ORAL | 0 refills | Status: DC

## 2023-03-03 NOTE — Progress Notes (Signed)
Primary Care Physician: Merri Brunette, MD Primary Cardiologist: Dr Royann Shivers  Primary Electrophysiologist: Dr Elberta Fortis  Referring Physician: Dr Ronita Hipps is a 77 y.o. female with a history of celiac disease, glaucoma, and atrial fibrillation who presents for follow up in the [facility].  The patient was initially diagnosed with atrial fibrillation in 2018 in the setting of an acute GI illness which was later diagnosed as Celiac disease. Patient is on Eliquis for a CHADS2VASC score of 3. She developed persistent afib and underwent DCCV on 10/30/20 which was unsuccessful. Seen by Dr Elberta Fortis and underwent afib ablation 01/28/21.  One week after her ablation she presents to the hospital with progressive paralysis. She was diagnosed with Guillain-Barr syndrome. She had prolonged hospitalization with rehab. She presented to rehab in atrial flutter and is status post DCCV. At her routine follow up on 01/06/23 she was found to be in atrial flutter. Zio monitor showed 100% burden. She underwent DCCV on 01/31/23 but was back in atrial flutter at follow up.   On follow up today, patient reports that she feels about the same since her last visit. The extra BB has not had much impact on her heart rate. No bleeding issues on anticoagulation.   Today, she denies symptoms of palpitations, orthopnea, PND, lower extremity edema, dizziness, presyncope, syncope, snoring, daytime somnolence, bleeding, or neurologic sequela. The patient is tolerating medications without difficulties and is otherwise without complaint today.    Atrial Fibrillation Risk Factors:  she does not have symptoms or diagnosis of sleep apnea. she does not have a history of rheumatic fever.   Atrial Fibrillation Management history:  Previous antiarrhythmic drugs: amiodarone  Previous cardioversions: 10/30/20, 03/16/21, 01/31/23 Previous ablations: 01/28/21 Anticoagulation history: Elilquis   Past  Medical History:  Diagnosis Date   Abnormal uterine bleeding    on HRT   Allergy    seasonal   Arthritis    Atrial fibrillation (HCC)    Celiac disease    DDD (degenerative disc disease)    Dysrhythmia    GBS (Guillain-Barre syndrome) (HCC)    Glaucoma    Microscopic colitis    Osteoarthritis of both knees    PONV (postoperative nausea and vomiting)    SVT (supraventricular tachycardia)     ROS- All systems are reviewed and negative except as per the HPI above.  Physical Exam: Vitals:   03/03/23 1059  BP: 110/80  Pulse: (!) 101  Weight: 62.1 kg  Height: 5\' 3"  (1.6 m)     GEN: Well nourished, well developed in no acute distress NECK: No JVD; No carotid bruits CARDIAC: Irregularly irregular rate and rhythm, no murmurs, rubs, gallops RESPIRATORY:  Clear to auscultation without rales, wheezing or rhonchi  ABDOMEN: Soft, non-tender, non-distended EXTREMITIES:  No edema; No deformity    Wt Readings from Last 3 Encounters:  03/03/23 62.1 kg  02/10/23 62.6 kg  01/31/23 62.6 kg    EKG today demonstrates  Atypical atrial flutter with variable block Vent. rate 101 BPM PR interval * ms QRS duration 68 ms QT/QTcB 318/412 ms   Echo 12/04/20 demonstrated   1. Left ventricular ejection fraction, by estimation, is 55%. The left  ventricle has normal function. The left ventricle has no regional wall  motion abnormalities. Left ventricular diastolic parameters were grossly  normal.   2. Right ventricular systolic function is normal. The right ventricular  size is mildly enlarged. There is normal pulmonary artery systolic  pressure.  The estimated right ventricular systolic pressure is 25.7 mmHg.   3. Left atrial size was moderately dilated.   4. Right atrial size was mild to moderately dilated.   5. The mitral valve is normal in structure. Mild mitral valve  regurgitation. No evidence of mitral stenosis.   6. Tricuspid valve regurgitation is mild to moderate.   7. The  aortic valve is grossly normal. There is mild calcification of the  aortic valve. Aortic valve regurgitation is not visualized. No aortic  stenosis is present.   8. The inferior vena cava is normal in size with greater than 50%  respiratory variability, suggesting right atrial pressure of 3 mmHg.    Epic records are reviewed at length today  CHA2DS2-VASc Score = 3  The patient's score is based upon: CHF History: 0 HTN History: 0 Diabetes History: 0 Stroke History: 0 Vascular Disease History: 0 Age Score: 2 Gender Score: 1       ASSESSMENT AND PLAN: Persistent Atrial Fibrillation/atrial flutter The patient's CHA2DS2-VASc score is 3, indicating a 3.2% annual risk of stroke.   S/p afib ablation 01/28/21 S/p DCCV 01/31/23 with quick return of atrial flutter We discussed rhythm control again today. Could consider dofetilide or amiodarone or repeat ablation. She is understandably hesitant about another ablation given the proximity of her GBS diagnosis to her previous ablation. She would also like to avoid another hospital stay to start dofetilide. After discussing the risks and benefits, will start amiodarone 200 mg BID x 4 weeks then decrease to once daily. Cmet in care everywhere reviewed. Check TSH today. If she does not convert chemically, will plan for DCCV.  Continue Toprol 25 mg BID Continue Eliquis 5 mg BID  Secondary Hypercoagulable State (ICD10:  D68.69) The patient is at significant risk for stroke/thromboembolism based upon her CHA2DS2-VASc Score of 3.  Continue Apixaban (Eliquis).   CAD CAC score 282 on CT No anginal symptoms   Follow up in the AF clinic in 2-3 weeks for ECG.    Jorja Loa PA-C Afib Clinic [facility] [address] [place], [place] [address] [phone] 03/03/2023 11:20 AM

## 2023-03-17 ENCOUNTER — Encounter (HOSPITAL_COMMUNITY): Payer: Medicare Other | Admitting: Physician Assistant

## 2023-03-24 ENCOUNTER — Ambulatory Visit (HOSPITAL_COMMUNITY)
Admission: RE | Admit: 2023-03-24 | Discharge: 2023-03-24 | Disposition: A | Discharge status: Home / Self Care | Payer: Medicare Other | Source: Ambulatory Visit | Attending: Physician Assistant | Admitting: Physician Assistant

## 2023-03-24 DIAGNOSIS — I4819 Other persistent atrial fibrillation: Secondary | ICD-10-CM | POA: Insufficient documentation

## 2023-03-24 DIAGNOSIS — Z79899 Other long term (current) drug therapy: Secondary | ICD-10-CM

## 2023-03-24 DIAGNOSIS — I480 Paroxysmal atrial fibrillation: Secondary | ICD-10-CM

## 2023-03-24 MED ORDER — METOPROLOL SUCCINATE ER 25 MG PO TB24: 25.0000 mg | ORAL_TABLET | Freq: Every day | ORAL | 1 refills | Status: DC

## 2023-03-24 MED ORDER — AMIODARONE HCL 200 MG PO TABS: 200.0000 mg | ORAL_TABLET | Freq: Every day | ORAL | 1 refills | Status: DC

## 2023-03-24 NOTE — Patient Instructions (Signed)
October 3rd - reduce amiodarone to 200mg  ONCE a day   Decrease metoprolol to 25mg  once a day

## 2023-03-24 NOTE — Progress Notes (Signed)
Patient returns for ECG after starting amiodarone. ECG shows:  SR, 1st degree AV block Vent. rate 68 BPM PR interval 218 ms QRS duration 78 ms QT/QTcB 422/448 ms  She felt she was back in SR last week. She is tolerating the medication without difficulty. Will decrease her Toprol to 25 mg once daily now that she is back in SR. She will continue amiodarone 200 mg BID for one more week then decrease to once daily. F/u in the AF clinic in 3 months.

## 2023-06-16 ENCOUNTER — Ambulatory Visit (HOSPITAL_COMMUNITY)
Admission: RE | Admit: 2023-06-16 | Discharge: 2023-06-16 | Disposition: A | Discharge status: Home / Self Care | Payer: Medicare Other | Source: Ambulatory Visit | Attending: Physician Assistant | Admitting: Physician Assistant

## 2023-06-16 VITALS — BP 128/68 | HR 63 | Ht 63.0 in | Wt 139.8 lb

## 2023-06-16 DIAGNOSIS — H409 Unspecified glaucoma: Secondary | ICD-10-CM | POA: Insufficient documentation

## 2023-06-16 DIAGNOSIS — I4819 Other persistent atrial fibrillation: Secondary | ICD-10-CM | POA: Diagnosis not present

## 2023-06-16 DIAGNOSIS — Z5181 Encounter for therapeutic drug level monitoring: Secondary | ICD-10-CM | POA: Diagnosis not present

## 2023-06-16 DIAGNOSIS — D6869 Other thrombophilia: Secondary | ICD-10-CM | POA: Insufficient documentation

## 2023-06-16 DIAGNOSIS — Z7901 Long term (current) use of anticoagulants: Secondary | ICD-10-CM | POA: Insufficient documentation

## 2023-06-16 DIAGNOSIS — I2510 Atherosclerotic heart disease of native coronary artery without angina pectoris: Secondary | ICD-10-CM | POA: Insufficient documentation

## 2023-06-16 DIAGNOSIS — Z79899 Other long term (current) drug therapy: Secondary | ICD-10-CM | POA: Insufficient documentation

## 2023-06-16 DIAGNOSIS — I4892 Unspecified atrial flutter: Secondary | ICD-10-CM | POA: Diagnosis not present

## 2023-06-16 LAB — COMPREHENSIVE METABOLIC PANEL
ALT: 15 U/L (ref 0–44)
AST: 22 U/L (ref 15–41)
Albumin: 3.5 g/dL (ref 3.5–5.0)
Alkaline Phosphatase: 47 U/L (ref 38–126)
Anion gap: 9 (ref 5–15)
BUN: 8 mg/dL (ref 8–23)
CO2: 23 mmol/L (ref 22–32)
Calcium: 9.2 mg/dL (ref 8.9–10.3)
Chloride: 100 mmol/L (ref 98–111)
Creatinine, Ser: 0.6 mg/dL (ref 0.44–1.00)
GFR, Estimated: >60 mL/min (ref >60)
Glucose, Bld: 74 mg/dL (ref 70–99)
Potassium: 4.4 mmol/L (ref 3.5–5.1)
Sodium: 132 mmol/L — ABNORMAL LOW (ref 135–145)
Total Bilirubin: 0.5 mg/dL (ref <1.2)
Total Protein: 7.2 g/dL (ref 6.5–8.1)

## 2023-06-16 LAB — TSH: TSH: 3.187 u[IU]/mL (ref 0.350–4.500)

## 2023-06-16 NOTE — Progress Notes (Signed)
Primary Care Physician: Merri Brunette, MD Primary Cardiologist: Dr Royann Shivers  Primary Electrophysiologist: Dr Elberta Fortis  Referring Physician: Dr Ronita Hipps is a 77 y.o. female with a history of celiac disease, glaucoma, and atrial fibrillation who presents for follow up in the [facility].  The patient was initially diagnosed with atrial fibrillation in 2018 in the setting of an acute GI illness which was later diagnosed as Celiac disease. Patient is on Eliquis for a CHADS2VASC score of 3. She developed persistent afib and underwent DCCV on 10/30/20 which was unsuccessful. Seen by Dr Elberta Fortis and underwent afib ablation 01/28/21.  One week after her ablation she presents to the hospital with progressive paralysis. She was diagnosed with Guillain-Barr syndrome. She had prolonged hospitalization with rehab. She presented to rehab in atrial flutter and is status post DCCV. At her routine follow up on 01/06/23 she was found to be in atrial flutter. Zio monitor showed 100% burden. She underwent DCCV on 01/31/23 but was back in atrial flutter at follow up. She was loaded on amiodarone.   On follow up today, patient reports that she has done well since her last visit. She has not had any interim symptoms of afib. She is tolerating the medication without difficulty.   Today, she denies symptoms of palpitations, orthopnea, PND, lower extremity edema, dizziness, presyncope, syncope, snoring, daytime somnolence, bleeding, or neurologic sequela. The patient is tolerating medications without difficulties and is otherwise without complaint today.    Atrial Fibrillation Risk Factors:  she does not have symptoms or diagnosis of sleep apnea. she does not have a history of rheumatic fever.   Atrial Fibrillation Management history:  Previous antiarrhythmic drugs: amiodarone  Previous cardioversions: 10/30/20, 03/16/21, 01/31/23 Previous ablations: 01/28/21 Anticoagulation  history: Elilquis   Past Medical History:  Diagnosis Date   Abnormal uterine bleeding    on HRT   Allergy    seasonal   Arthritis    Atrial fibrillation (HCC)    Celiac disease    DDD (degenerative disc disease)    Dysrhythmia    GBS (Guillain-Barre syndrome) (HCC)    Glaucoma    Microscopic colitis    Osteoarthritis of both knees    PONV (postoperative nausea and vomiting)    SVT (supraventricular tachycardia)     ROS- All systems are reviewed and negative except as per the HPI above.  Physical Exam: Vitals:   06/16/23 0926  BP: 128/68  Pulse: 63  Weight: 63.4 kg  Height: 5\' 3"  (1.6 m)    GEN: Well nourished, well developed in no acute distress NECK: No JVD; No carotid bruits CARDIAC: Regular rate and rhythm, no murmurs, rubs, gallops RESPIRATORY:  Clear to auscultation without rales, wheezing or rhonchi  ABDOMEN: Soft, non-tender, non-distended EXTREMITIES:  No edema; No deformity    Wt Readings from Last 3 Encounters:  06/16/23 63.4 kg  03/03/23 62.1 kg  02/10/23 62.6 kg    EKG today demonstrates  SR, 1st degree AV block Vent. rate 63 BPM PR interval 244 ms QRS duration 70 ms QT/QTcB 414/423 ms   Echo 12/04/20 demonstrated   1. Left ventricular ejection fraction, by estimation, is 55%. The left  ventricle has normal function. The left ventricle has no regional wall  motion abnormalities. Left ventricular diastolic parameters were grossly  normal.   2. Right ventricular systolic function is normal. The right ventricular  size is mildly enlarged. There is normal pulmonary artery systolic  pressure. The  estimated right ventricular systolic pressure is 25.7 mmHg.   3. Left atrial size was moderately dilated.   4. Right atrial size was mild to moderately dilated.   5. The mitral valve is normal in structure. Mild mitral valve  regurgitation. No evidence of mitral stenosis.   6. Tricuspid valve regurgitation is mild to moderate.   7. The aortic valve is  grossly normal. There is mild calcification of the  aortic valve. Aortic valve regurgitation is not visualized. No aortic  stenosis is present.   8. The inferior vena cava is normal in size with greater than 50%  respiratory variability, suggesting right atrial pressure of 3 mmHg.    Epic records are reviewed at length today  CHA2DS2-VASc Score = 3  The patient's score is based upon: CHF History: 0 HTN History: 0 Diabetes History: 0 Stroke History: 0 Vascular Disease History: 0 Age Score: 2 Gender Score: 1       ASSESSMENT AND PLAN: Persistent Atrial Fibrillation/atrial flutter The patient's CHA2DS2-VASc score is 3, indicating a 3.2% annual risk of stroke.   S/p afib ablation 01/28/21 S/p DCCV 01/31/23 with quick return of atrial flutter, started on amiodarone. Patient appears to be maintaining SR Continue amiodarone 200 mg daily Check cmet/TSH today Continue Toprol 25 mg BID Continue Eliquis 5 mg BID  Secondary Hypercoagulable State (ICD10:  D68.69) The patient is at significant risk for stroke/thromboembolism based upon her CHA2DS2-VASc Score of 3.  Continue Apixaban (Eliquis).   CAD CAC score 282 on CT No anginal symptoms   Follow up in the AF clinic in 6 months.    Jorja Loa PA-C [facility] [address] [place], [place] [address] [phone] 06/16/2023 9:41 AM

## 2023-06-16 NOTE — Addendum Note (Signed)
Encounter addended by: Graylin Shiver, CMA on: 06/16/2023 9:58 AM  Actions taken: Order list changed, Diagnosis association updated

## 2023-06-23 DIAGNOSIS — H40013 Open angle with borderline findings, low risk, bilateral: Secondary | ICD-10-CM | POA: Diagnosis not present

## 2023-08-07 DIAGNOSIS — E039 Hypothyroidism, unspecified: Secondary | ICD-10-CM | POA: Insufficient documentation

## 2023-08-07 DIAGNOSIS — Z8669 Personal history of other diseases of the nervous system and sense organs: Secondary | ICD-10-CM | POA: Insufficient documentation

## 2023-08-07 DIAGNOSIS — R931 Abnormal findings on diagnostic imaging of heart and coronary circulation: Secondary | ICD-10-CM | POA: Insufficient documentation

## 2023-08-07 NOTE — Progress Notes (Signed)
Cardiology Office Note    Date:  08/15/2023   ID:  Angela Morales, Angela Morales [birth year], MRN [id number]  PCP:  Angela Brunette, MD  Cardiologist:   Angela Fair, MD   Chief Complaint  Patient presents with   Atrial Fibrillation     History of Present Illness:  Angela Morales is a 78 y.o. female with what appears to be "lone" atrial fibrillation. She has moderately elevated coronary calcium score without clinical CAD or PAD.  She is a retired Publishing rights manager.  She had many years of paroxysmal atrial fibrillation, but subsequently developed persistent atrial fibrillation in 2022 which was highly symptomatic.  She underwent a successful ablation in early August 2022 with Dr. Elberta Morales, but just a week later was hospitalized for Guillain-Barr syndrome.  She has largely recovered from this.  She had DC CV for persistent atrial flutter 01/31/2023, with recurrence by the time of her f/u visit 02/10/2023 so started amiodarone and converted to NSR after about a month, without electrical CV. She has been on maineneance dose amiodarone 200 mg daily since October 2024. On chronic Eliquis anticoagulation, CHADSVasc 3 (age, gender). Ophthalmologist has seen some corneal deposits, but these have not interfered with vision.  She has glaucoma involving the right eye.  Normal LFTs and TSH in December 2024.  Has had some intermittent and mild left sided chest lifting gardening pots, none climbing stairs. A little tender to touch over the left costochondral joints.  Her first detected episode of atrial fibrillation was related to an acute diarrheal illness, eventually proven to the true celiac disease.  Since then her GI symptoms have improved substantially on a gluten-free diet.  Previous workup has demonstrated the absence of significant structural cardiac abnormalities. Both the right and left atrium were described as being normal in size by echocardiography. There are no valvular problems.  Calcium score  282, 78th %ile in 2022.  Past Medical History:  Diagnosis Date   Abnormal uterine bleeding    on HRT   Allergy    seasonal   Arthritis    Atrial fibrillation (HCC)    Celiac disease    DDD (degenerative disc disease)    Dysrhythmia    GBS (Guillain-Barre syndrome) (HCC)    Glaucoma    Microscopic colitis    Osteoarthritis of both knees    PONV (postoperative nausea and vomiting)    SVT (supraventricular tachycardia) (HCC)     Past Surgical History:  Procedure Laterality Date   APPENDECTOMY     ATRIAL FIBRILLATION ABLATION N/A 01/28/2021   Procedure: ATRIAL FIBRILLATION ABLATION;  Surgeon: Angela Lemming, MD;  Location: [facility] INVASIVE CV LAB;  Service: Cardiovascular;  Laterality: N/A;   CARDIOVERSION N/A 10/30/2020   Procedure: CARDIOVERSION;  Surgeon: Angela Bathe, MD;  Location: [facility] ENDOSCOPY;  Service: Cardiovascular;  Laterality: N/A;   CARDIOVERSION N/A 03/16/2021   Procedure: CARDIOVERSION;  Surgeon: Angela Mixer, MD;  Location: [facility] ENDOSCOPY;  Service: Cardiovascular;  Laterality: N/A;   CARDIOVERSION N/A 01/31/2023   Procedure: CARDIOVERSION;  Surgeon: Angela Bathe, MD;  Location: [facility] INVASIVE CV LAB;  Service: Cardiovascular;  Laterality: N/A;   COLONOSCOPY     FOOT FRACTURE SURGERY  10/2013   TONSILLECTOMY AND ADENOIDECTOMY     TOTAL KNEE ARTHROPLASTY Left 03/22/2016   Procedure: TOTAL KNEE ARTHROPLASTY;  Surgeon: Angela Geralds, MD;  Location: [facility] OR;  Service: Orthopedics;  Laterality: Left;   TUBAL LIGATION  1981   VAGINAL HYSTERECTOMY  04/20/05   prolapse,  adenomyosis    Current Medications: Outpatient Medications Prior to Visit  Medication Sig Dispense Refill   Cholecalciferol 50 MCG (2000 UT) CAPS Take 2,000 Units by mouth daily.     [facility] MAGNESIUM GLYCINATE PO Take 1 capsule by mouth daily at 6 (six) AM.     ELIQUIS 5 MG TABS tablet TAKE 1 TABLET BY MOUTH TWICE A DAY 180 tablet 1   gabapentin (NEURONTIN) 300 MG capsule TAKE 1 CAPSULE BY MOUTH EVERYDAY AT  BEDTIME 90 capsule 1   levothyroxine (SYNTHROID) 50 MCG tablet Take 50 mcg by mouth daily before breakfast.     metoprolol succinate (TOPROL XL) 25 MG 24 hr tablet Take 1 tablet (25 mg total) by mouth daily. 90 tablet 1   polyethylene glycol (MIRALAX / GLYCOLAX) 17 g packet Take 17 g by mouth daily as needed. (Patient taking differently: Take 17 g by mouth daily.) 14 each 0   Propylene Glycol (SYSTANE COMPLETE) 0.6 % SOLN Place 1 drop into both eyes as needed (dry eyes).     timolol (TIMOPTIC) 0.5 % ophthalmic solution Place 1 drop into the left eye 2 (two) times daily. 10 mL    ZIOPTAN 0.0015 % SOLN Place 1 drop into the left eye at bedtime.     amiodarone (PACERONE) 200 MG tablet Take 1 tablet (200 mg total) by mouth daily. 90 tablet 1   No facility-administered medications prior to visit.     Allergies:   Gluten meal, Lactose intolerance (gi), Cefdinir, and Robaxin [methocarbamol]   Social History   Socioeconomic History   Marital status: Married    Spouse name: Angela Morales   Number of children: 3   Years of education: Not on file   Highest education level: Not on file  Occupational History   Occupation: retired  Tobacco Use   Smoking status: Never   Smokeless tobacco: Never   Tobacco comments:    Never smoke 02/10/23  Vaping Use   Vaping status: Never Used  Substance and Sexual Activity   Alcohol use: Yes    Alcohol/week: 7.0 standard drinks of alcohol    Types: 7 Glasses of wine per week    Comment: 1 glass of wine with dinner 02/10/23   Drug use: No   Sexual activity: Yes    Partners: Male    Birth control/protection: Surgical    Comment: TVH  Other Topics Concern   Not on file  Social History Narrative   Lives with husband and son   Left handed   Caffeine: 1 cup of coffee a day   Social Drivers of Corporate investment banker Strain: Not on file  Food Insecurity: Not on file  Transportation Needs: Not on file  Physical Activity: Not on file  Stress: Not on file   Social Connections: Not on file     Family History:  The patient's family history includes Bipolar disorder in her son; Breast cancer in her cousin; Heart disease (age of onset: 30) in her brother; Liver disease in her son; Osteoarthritis in her maternal grandmother; Osteoporosis in her mother; Prostate cancer in her father; Stroke in her mother.   ROS:   Please see the history of present illness.    ROS All other systems reviewed and are negative.   PHYSICAL EXAM:   VS:  BP 122/68 (BP Location: Left Arm, Patient Position: Sitting, Cuff Size: Normal)   Pulse 66   Ht 5\' 3"  (1.6 m)   Wt 142 lb 3.2 oz (64.5 kg)  LMP 04/20/2005   SpO2 99%   BMI 25.19 kg/m      General: Alert, oriented x3, no distress, lean and fit Head: no evidence of trauma, PERRL, EOMI, no exophtalmos or lid lag, no myxedema, no xanthelasma; normal ears, nose and oropharynx Neck: normal jugular venous pulsations and no hepatojugular reflux; brisk carotid pulses without delay and no carotid bruits Chest: clear to auscultation, no signs of consolidation by percussion or palpation, normal fremitus, symmetrical and full respiratory excursions Cardiovascular: normal position and quality of the apical impulse, regular rhythm, normal first and second heart sounds, no murmurs, rubs or gallops Abdomen: no tenderness or distention, no masses by palpation, no abnormal pulsatility or arterial bruits, normal bowel sounds, no hepatosplenomegaly Extremities: no clubbing, cyanosis or edema; 2+ radial, ulnar and brachial pulses bilaterally; 2+ right femoral, posterior tibial and dorsalis pedis pulses; 2+ left femoral, posterior tibial and dorsalis pedis pulses; no subclavian or femoral bruits Neurological: grossly nonfocal Psych: Normal mood and affect     Wt Readings from Last 3 Encounters:  08/15/23 142 lb 3.2 oz (64.5 kg)  06/16/23 139 lb 12.8 oz (63.4 kg)  03/03/23 137 lb (62.1 kg)     Studies/Labs Reviewed:   ECHO  12/01/2020   1. Left ventricular ejection fraction, by estimation, is 55%. The left  ventricle has normal function. The left ventricle has no regional wall  motion abnormalities. Left ventricular diastolic parameters were grossly  normal.   2. Right ventricular systolic function is normal. The right ventricular  size is mildly enlarged. There is normal pulmonary artery systolic  pressure. The estimated right ventricular systolic pressure is 25.7 mmHg.   3. Left atrial size was moderately dilated.   4. Right atrial size was mild to moderately dilated.   5. The mitral valve is normal in structure. Mild mitral valve  regurgitation. No evidence of mitral stenosis.   6. Tricuspid valve regurgitation is mild to moderate.   7. The aortic valve is grossly normal. There is mild calcification of the  aortic valve. Aortic valve regurgitation is not visualized. No aortic  stenosis is present.   8. The inferior vena cava is normal in size with greater than 50%  respiratory variability, suggesting right atrial pressure of 3 mmHg.   EKG:  EKG 06/16/2023 is personally reviewed and shows SR with 1st deg AVB (244 ms).  EKG Interpretation Date/Time:    Ventricular Rate:    PR Interval:    QRS Duration:    QT Interval:    QTC Calculation:   R Axis:      Text Interpretation:           BMET    Component Value Date/Time   NA 132 (L) 06/16/2023 1028   NA 141 02/04/2022 0950   K 4.4 06/16/2023 1028   CL 100 06/16/2023 1028   CO2 23 06/16/2023 1028   GLUCOSE 74 06/16/2023 1028   BUN 8 06/16/2023 1028   BUN 12 02/04/2022 0950   CREATININE 0.60 06/16/2023 1028   CALCIUM 9.2 06/16/2023 1028   GFRNONAA >60 06/16/2023 1028   GFRAA >60 03/23/2016 0315  10/20/2018 creatinine 0.77, normal liver function tests  Lipid Panel     Component Value Date/Time   TRIG 92 02/13/2021 0316    10/24/2019 Total cholesterol 161, HDL 51, triglycerides 93, calculated LDL 93  [facility] 11/09/2022 Cholesterol  171    Triglycerides mg/dL 86 0 mg/dL 865   HDL Cholesterol mg/dL 52 39 mg/dL   VLDL Cholesterol  Cal mg/dL 16 5 mg/dL 40   LDL Chol Calc (NIH) mg/dL 191 H      ASSESSMENT:    1. Persistent atrial fibrillation (HCC)   2. Atypical atrial flutter (HCC)   3. Acquired thrombophilia (HCC)   4. Encounter for monitoring amiodarone therapy   5. Celiac disease   6. Glaucoma of both eyes, unspecified glaucoma type   7. History of Guillain-Barre syndrome   8. Acquired hypothyroidism   9. Elevated coronary artery calcium score        PLAN:  In order of problems listed above:  Persistent atrial fibrillation: Denies palpitations since the last evaluation. Arrhythmia recurred as atrial flutter after successful ablation (complicated by GBS).  In normal rhythm on low dose amiodarone. CHA2DS2-VASc 3 (age 44, gender). Anticoagulation: no bleeding complications: Amiodarone: normal LFTs and TSH 06/16/2023. Reduce the dose to 100 mg daily. Aware of need to avoid sun exposure, report respiratory issues, be wary of drug interactions. Celiac disease: Currently asymptomatic on gluten free diet Glaucoma: watch for bradycardia on beta-blocker eyedrops. Should be less of an issue as we reduce the amiodarone. Guillain Barr syndrome: Resolved. Hypothyroidism: Clinically euthyroid, normal TSH 2 months ago.. Elevated coronary Ca score: LDL right at 100, good HDL. No symptoms of CAD or PAD.   Risk Assessment/Calculations:          CHA2DS2-VASc Score = 3   This indicates a 3.2% annual risk of stroke. The patient's score is based upon: CHF History: 0 HTN History: 0 Diabetes History: 0 Stroke History: 0 Vascular Disease History: 0 Age Score: 2 Gender Score: 1          Medication Adjustments/Labs and Tests Ordered: Current medicines are reviewed at length with the patient today.  Concerns regarding medicines are outlined above.  No orders of the defined types were placed in this  encounter.  Meds ordered this encounter  Medications   DISCONTD: amiodarone (PACERONE) 100 MG tablet    Sig: Take 1 tablet (100 mg total) by mouth daily.    Dispense:  90 tablet    Refill:  3   DISCONTD: amiodarone (PACERONE) 200 MG tablet    Sig: Take 0.5 tablets (100 mg total) by mouth daily.    Dispense:  45 tablet    Refill:  3    Please done fill the script sent for 100 mg. Thanks   DISCONTD: amiodarone (PACERONE) 200 MG tablet    Sig: Take 0.5 tablets (100 mg total) by mouth daily.    Dispense:  45 tablet    Refill:  3    Please done fill the script sent for 100 mg. Thanks   amiodarone (PACERONE) 200 MG tablet    Sig: Take 0.5 tablets (100 mg total) by mouth daily.    Dispense:  45 tablet    Refill:  3    Please dont fill the script sent for 100 mg. Thanks    Patient Instructions  Medication Instructions:  Decrease amiodarone to 100 mg by mouth daily. Take 1/2 tablet of the 200 mg pill. *If you need a refill on your cardiac medications before your next appointment, please call your pharmacy*   Follow-Up: At [facility], you and your health needs are our priority.  As part of our continuing mission to provide you with exceptional heart care, we have created designated Provider Care Teams.  These Care Teams include your primary Cardiologist (physician) and Advanced Practice Providers (APPs -  Physician Assistants and Nurse Practitioners) who  all work together to provide you with the care you need, when you need it.  We recommend signing up for the patient portal called "MyChart".  Sign up information is provided on this After Visit Summary.  MyChart is used to connect with patients for Virtual Visits (Telemedicine).  Patients are able to view lab/test results, encounter notes, upcoming appointments, etc.  Non-urgent messages can be sent to your provider as well.   To learn more about what you can do with MyChart, go to [email or web address].    Your next  appointment:   1 year(s)  Provider:   Thurmon Fair, MD     Other Instructions          Signed, Angela Fair, MD  08/15/2023 10:12 AM    [facility]

## 2023-08-13 ENCOUNTER — Other Ambulatory Visit (HOSPITAL_COMMUNITY): Payer: Self-pay | Admitting: Physician Assistant

## 2023-08-15 ENCOUNTER — Ambulatory Visit: Payer: Medicare Other | Attending: Cardiovascular Disease | Admitting: Cardiovascular Disease

## 2023-08-15 ENCOUNTER — Encounter: Payer: Self-pay | Admitting: Cardiovascular Disease

## 2023-08-15 VITALS — BP 122/68 | HR 66 | Ht 63.0 in | Wt 142.2 lb

## 2023-08-15 DIAGNOSIS — I4819 Other persistent atrial fibrillation: Secondary | ICD-10-CM | POA: Diagnosis not present

## 2023-08-15 DIAGNOSIS — Z8669 Personal history of other diseases of the nervous system and sense organs: Secondary | ICD-10-CM

## 2023-08-15 DIAGNOSIS — Z5181 Encounter for therapeutic drug level monitoring: Secondary | ICD-10-CM | POA: Diagnosis not present

## 2023-08-15 DIAGNOSIS — R931 Abnormal findings on diagnostic imaging of heart and coronary circulation: Secondary | ICD-10-CM

## 2023-08-15 DIAGNOSIS — H409 Unspecified glaucoma: Secondary | ICD-10-CM

## 2023-08-15 DIAGNOSIS — E039 Hypothyroidism, unspecified: Secondary | ICD-10-CM | POA: Diagnosis not present

## 2023-08-15 DIAGNOSIS — Z79899 Other long term (current) drug therapy: Secondary | ICD-10-CM

## 2023-08-15 DIAGNOSIS — D6869 Other thrombophilia: Secondary | ICD-10-CM | POA: Diagnosis not present

## 2023-08-15 DIAGNOSIS — K900 Celiac disease: Secondary | ICD-10-CM | POA: Diagnosis not present

## 2023-08-15 DIAGNOSIS — I484 Atypical atrial flutter: Secondary | ICD-10-CM

## 2023-08-15 MED ORDER — AMIODARONE HCL 200 MG PO TABS: 100.0000 mg | ORAL_TABLET | Freq: Every day | ORAL | 3 refills | Status: DC

## 2023-08-15 MED ORDER — AMIODARONE HCL 100 MG PO TABS: 100.0000 mg | ORAL_TABLET | Freq: Every day | ORAL | 3 refills | Status: DC

## 2023-08-15 NOTE — Patient Instructions (Addendum)
Medication Instructions:  Decrease amiodarone to 100 mg by mouth daily. Take 1/2 tablet of the 200 mg pill. *If you need a refill on your cardiac medications before your next appointment, please call your pharmacy*   Follow-Up: At [facility], you and your health needs are our priority.  As part of our continuing mission to provide you with exceptional heart care, we have created designated Provider Care Teams.  These Care Teams include your primary Cardiologist (physician) and Advanced Practice Providers (APPs -  Physician Assistants and Nurse Practitioners) who all work together to provide you with the care you need, when you need it.  We recommend signing up for the patient portal called "MyChart".  Sign up information is provided on this After Visit Summary.  MyChart is used to connect with patients for Virtual Visits (Telemedicine).  Patients are able to view lab/test results, encounter notes, upcoming appointments, etc.  Non-urgent messages can be sent to your provider as well.   To learn more about what you can do with MyChart, go to [email or web address].    Your next appointment:   1 year(s)  Provider:   Thurmon Fair, MD     Other Instructions

## 2023-08-29 ENCOUNTER — Telehealth: Payer: Self-pay | Admitting: *Deleted

## 2023-08-29 NOTE — Telephone Encounter (Signed)
 Received letter from [employer] company regarding non-formulary medication (amiodarone 100mg  tablets). Insurance will cover 200mg  tabs.   Spoke with [facility] staff at [facility] in [place]. Confirmed that they do have the correct prescription on file (patient will take half of a 200mg  tab daily). LMOVM (DPR) advising patient of updated sig on next refill of amiodarone. Direct number provided for any questions or concerns.

## 2023-09-01 NOTE — Telephone Encounter (Signed)
 Spoke with patient. She is aware that she will receive 200mg  amiodarone tablets the next time she refills this prescription and she states that she understands that she will need to split them going forward. She denies questions or concerns at this time.

## 2023-09-05 DIAGNOSIS — Z1231 Encounter for screening mammogram for malignant neoplasm of breast: Secondary | ICD-10-CM | POA: Diagnosis not present

## 2023-10-25 DIAGNOSIS — H40053 Ocular hypertension, bilateral: Secondary | ICD-10-CM | POA: Diagnosis not present

## 2023-12-01 DIAGNOSIS — I2510 Atherosclerotic heart disease of native coronary artery without angina pectoris: Secondary | ICD-10-CM | POA: Diagnosis not present

## 2023-12-01 DIAGNOSIS — M810 Age-related osteoporosis without current pathological fracture: Secondary | ICD-10-CM | POA: Diagnosis not present

## 2023-12-01 DIAGNOSIS — E039 Hypothyroidism, unspecified: Secondary | ICD-10-CM | POA: Diagnosis not present

## 2023-12-08 DIAGNOSIS — Z0000 Encounter for general adult medical examination without abnormal findings: Secondary | ICD-10-CM | POA: Diagnosis not present

## 2023-12-08 DIAGNOSIS — R339 Retention of urine, unspecified: Secondary | ICD-10-CM | POA: Diagnosis not present

## 2023-12-08 DIAGNOSIS — K900 Celiac disease: Secondary | ICD-10-CM | POA: Diagnosis not present

## 2023-12-08 DIAGNOSIS — M8580 Other specified disorders of bone density and structure, unspecified site: Secondary | ICD-10-CM | POA: Diagnosis not present

## 2023-12-08 DIAGNOSIS — I480 Paroxysmal atrial fibrillation: Secondary | ICD-10-CM | POA: Diagnosis not present

## 2023-12-08 DIAGNOSIS — I2510 Atherosclerotic heart disease of native coronary artery without angina pectoris: Secondary | ICD-10-CM | POA: Diagnosis not present

## 2023-12-08 DIAGNOSIS — Z8669 Personal history of other diseases of the nervous system and sense organs: Secondary | ICD-10-CM | POA: Diagnosis not present

## 2023-12-08 DIAGNOSIS — R8271 Bacteriuria: Secondary | ICD-10-CM | POA: Diagnosis not present

## 2023-12-08 DIAGNOSIS — I700 Atherosclerosis of aorta: Secondary | ICD-10-CM | POA: Diagnosis not present

## 2023-12-08 DIAGNOSIS — E559 Vitamin D deficiency, unspecified: Secondary | ICD-10-CM | POA: Diagnosis not present

## 2023-12-08 DIAGNOSIS — E039 Hypothyroidism, unspecified: Secondary | ICD-10-CM | POA: Diagnosis not present

## 2023-12-08 DIAGNOSIS — D6869 Other thrombophilia: Secondary | ICD-10-CM | POA: Diagnosis not present

## 2023-12-15 ENCOUNTER — Encounter (HOSPITAL_COMMUNITY): Payer: Self-pay | Admitting: Physician Assistant

## 2023-12-15 ENCOUNTER — Ambulatory Visit (HOSPITAL_COMMUNITY)
Admission: RE | Admit: 2023-12-15 | Discharge: 2023-12-15 | Disposition: A | Discharge status: Home / Self Care | Payer: Medicare Other | Source: Ambulatory Visit | Attending: Physician Assistant | Admitting: Physician Assistant

## 2023-12-15 VITALS — BP 112/72 | HR 68 | Ht 63.0 in | Wt 141.4 lb

## 2023-12-15 DIAGNOSIS — I4819 Other persistent atrial fibrillation: Secondary | ICD-10-CM | POA: Diagnosis not present

## 2023-12-15 DIAGNOSIS — Z5181 Encounter for therapeutic drug level monitoring: Secondary | ICD-10-CM | POA: Diagnosis not present

## 2023-12-15 DIAGNOSIS — Z79899 Other long term (current) drug therapy: Secondary | ICD-10-CM

## 2023-12-15 DIAGNOSIS — D6869 Other thrombophilia: Secondary | ICD-10-CM

## 2023-12-15 MED ORDER — AMIODARONE HCL 200 MG PO TABS: 100.0000 mg | ORAL_TABLET | ORAL | 3 refills | Status: DC

## 2023-12-15 NOTE — Addendum Note (Signed)
 Encounter addended by: Missouri Amor, RN on: 12/15/2023 10:37 AM  Actions taken: Order list changed

## 2023-12-15 NOTE — Progress Notes (Signed)
 Primary Care Physician: Imelda Man, MD Primary Cardiologist: Dr Alvis Ba  Primary Electrophysiologist: Dr Lawana Pray  Referring Physician: Dr Verneita Goldmann is a 78 y.o. female with a history of celiac disease, glaucoma, and atrial fibrillation who presents for follow up in the [facility].  The patient was initially diagnosed with atrial fibrillation in 2018 in the setting of an acute GI illness which was later diagnosed as Celiac disease. Patient is on Eliquis  for a CHADS2VASC score of 3. She developed persistent afib and underwent DCCV on 10/30/20 which was unsuccessful. Seen by Dr Lawana Pray and underwent afib ablation 01/28/21.  One week after her ablation she presents to the hospital with progressive paralysis. She was diagnosed with Guillain-Barr syndrome. She had prolonged hospitalization with rehab. She presented to rehab in atrial flutter and is status post DCCV. At her routine follow up on 01/06/23 she was found to be in atrial flutter. Zio monitor showed 100% burden. She underwent DCCV on 01/31/23 but was back in atrial flutter at follow up. She was loaded on amiodarone .   Patient returns for follow up for atrial fibrillation and amiodarone  monitoring. She reports that she has done well since her last visit. She denies any interim symptoms of afib despite decreasing her amiodarone  at her 08/15/23 vist. No bleeding issues on anticoagulation.   Today, she  denies symptoms of palpitations, chest pain, shortness of breath, orthopnea, PND, lower extremity edema, dizziness, presyncope, syncope, snoring, daytime somnolence, bleeding, or neurologic sequela. The patient is tolerating medications without difficulties and is otherwise without complaint today.    Atrial Fibrillation Risk Factors:  she does not have symptoms or diagnosis of sleep apnea. she does not have a history of rheumatic fever.   Atrial Fibrillation Management history:  Previous  antiarrhythmic drugs: amiodarone   Previous cardioversions: 10/30/20, 03/16/21, 01/31/23 Previous ablations: 01/28/21 Anticoagulation history: Elilquis   Past Medical History:  Diagnosis Date   Abnormal uterine bleeding    on HRT   Allergy    seasonal   Arthritis    Atrial fibrillation (HCC)    Celiac disease    DDD (degenerative disc disease)    Dysrhythmia    GBS (Guillain-Barre syndrome) (HCC)    Glaucoma    Microscopic colitis    Osteoarthritis of both knees    PONV (postoperative nausea and vomiting)    SVT (supraventricular tachycardia) (HCC)     ROS- All systems are reviewed and negative except as per the HPI above.  Physical Exam: Vitals:   12/15/23 0951  BP: 112/72  Pulse: 68  Weight: 64.1 kg  Height: 5' 3 (1.6 m)    GEN: Well nourished, well developed in no acute distress CARDIAC: Regular rate and rhythm, no murmurs, rubs, gallops RESPIRATORY:  Clear to auscultation without rales, wheezing or rhonchi  ABDOMEN: Soft, non-tender, non-distended EXTREMITIES:  No edema; No deformity    Wt Readings from Last 3 Encounters:  12/15/23 64.1 kg  08/15/23 64.5 kg  06/16/23 63.4 kg    EKG today demonstrates  SR, 1st degree AV block Vent. rate 68 BPM PR interval 248 ms QRS duration 84 ms QT/QTcB 406/431 ms   Echo 12/04/20 demonstrated   1. Left ventricular ejection fraction, by estimation, is 55%. The left  ventricle has normal function. The left ventricle has no regional wall  motion abnormalities. Left ventricular diastolic parameters were grossly  normal.   2. Right ventricular systolic function is normal. The right ventricular  size  is mildly enlarged. There is normal pulmonary artery systolic  pressure. The estimated right ventricular systolic pressure is 25.7 mmHg.   3. Left atrial size was moderately dilated.   4. Right atrial size was mild to moderately dilated.   5. The mitral valve is normal in structure. Mild mitral valve  regurgitation. No evidence  of mitral stenosis.   6. Tricuspid valve regurgitation is mild to moderate.   7. The aortic valve is grossly normal. There is mild calcification of the  aortic valve. Aortic valve regurgitation is not visualized. No aortic  stenosis is present.   8. The inferior vena cava is normal in size with greater than 50%  respiratory variability, suggesting right atrial pressure of 3 mmHg.    Epic records are reviewed at length today  CHA2DS2-VASc Score = 3  The patient's score is based upon: CHF History: 0 HTN History: 0 Diabetes History: 0 Stroke History: 0 Vascular Disease History: 0 Age Score: 2 Gender Score: 1       ASSESSMENT AND PLAN: Persistent Atrial Fibrillation/atrial flutter The patient's CHA2DS2-VASc score is 3, indicating a 3.2% annual risk of stroke.   S/p afib ablation 01/28/21 Patient appears to be maintaining SR Will decrease amiodarone  further to 100 mg daily M-F Continue Toprol  25 mg daily Continue Eliquis  5 mg BID  Secondary Hypercoagulable State (ICD10:  D68.69) The patient is at significant risk for stroke/thromboembolism based upon her CHA2DS2-VASc Score of 3.  Continue Apixaban  (Eliquis ). No bleeding issues.   High Risk Medication Monitoring (ICD 10: Z79.899) Intervals on ECG acceptable for amiodarone  monitoring. Recent cmet and TSH reviewed in care everywhere.   CAD No anginal symptoms Followed by Dr Alvis Ba    Follow up with Dr Alvis Ba per recall. AF clinic in one year.    Myrtha Ates PA-C Afib Clinic [facility] [address] [place], [place] [address] [phone] 12/15/2023 10:00 AM

## 2024-01-19 DIAGNOSIS — I700 Atherosclerosis of aorta: Secondary | ICD-10-CM | POA: Diagnosis not present

## 2024-02-16 DIAGNOSIS — I2510 Atherosclerotic heart disease of native coronary artery without angina pectoris: Secondary | ICD-10-CM | POA: Diagnosis not present

## 2024-02-16 DIAGNOSIS — M85852 Other specified disorders of bone density and structure, left thigh: Secondary | ICD-10-CM | POA: Diagnosis not present

## 2024-02-16 DIAGNOSIS — M8589 Other specified disorders of bone density and structure, multiple sites: Secondary | ICD-10-CM | POA: Diagnosis not present

## 2024-02-21 DIAGNOSIS — H40053 Ocular hypertension, bilateral: Secondary | ICD-10-CM | POA: Diagnosis not present

## 2024-03-23 ENCOUNTER — Other Ambulatory Visit (HOSPITAL_COMMUNITY): Payer: Self-pay | Admitting: *Deleted

## 2024-03-23 MED ORDER — METOPROLOL SUCCINATE ER 25 MG PO TB24: 25.0000 mg | ORAL_TABLET | Freq: Every day | ORAL | 2 refills | Status: DC

## 2024-07-05 ENCOUNTER — Inpatient Hospital Stay (HOSPITAL_COMMUNITY)
Admission: EM | Admit: 2024-07-05 | Discharge: 2024-07-07 | DRG: 321 | Disposition: A | Attending: Cardiovascular Disease | Admitting: Cardiovascular Disease

## 2024-07-05 ENCOUNTER — Other Ambulatory Visit: Payer: Self-pay

## 2024-07-05 ENCOUNTER — Inpatient Hospital Stay (HOSPITAL_COMMUNITY)

## 2024-07-05 ENCOUNTER — Encounter (HOSPITAL_COMMUNITY): Admission: EM | Disposition: A | Payer: Self-pay | Source: Home / Self Care | Attending: Cardiovascular Disease

## 2024-07-05 DIAGNOSIS — Z96652 Presence of left artificial knee joint: Secondary | ICD-10-CM | POA: Diagnosis present

## 2024-07-05 DIAGNOSIS — I2510 Atherosclerotic heart disease of native coronary artery without angina pectoris: Secondary | ICD-10-CM | POA: Diagnosis present

## 2024-07-05 DIAGNOSIS — I255 Ischemic cardiomyopathy: Secondary | ICD-10-CM | POA: Diagnosis present

## 2024-07-05 DIAGNOSIS — Z8262 Family history of osteoporosis: Secondary | ICD-10-CM

## 2024-07-05 DIAGNOSIS — I5021 Acute systolic (congestive) heart failure: Secondary | ICD-10-CM | POA: Diagnosis present

## 2024-07-05 DIAGNOSIS — I5020 Unspecified systolic (congestive) heart failure: Secondary | ICD-10-CM | POA: Insufficient documentation

## 2024-07-05 DIAGNOSIS — Z803 Family history of malignant neoplasm of breast: Secondary | ICD-10-CM

## 2024-07-05 DIAGNOSIS — Z79890 Hormone replacement therapy (postmenopausal): Secondary | ICD-10-CM

## 2024-07-05 DIAGNOSIS — I480 Paroxysmal atrial fibrillation: Secondary | ICD-10-CM | POA: Diagnosis present

## 2024-07-05 DIAGNOSIS — G610 Guillain-Barre syndrome: Secondary | ICD-10-CM | POA: Diagnosis present

## 2024-07-05 DIAGNOSIS — Z7982 Long term (current) use of aspirin: Secondary | ICD-10-CM | POA: Diagnosis not present

## 2024-07-05 DIAGNOSIS — F32A Depression, unspecified: Secondary | ICD-10-CM | POA: Diagnosis present

## 2024-07-05 DIAGNOSIS — Z90710 Acquired absence of both cervix and uterus: Secondary | ICD-10-CM | POA: Diagnosis not present

## 2024-07-05 DIAGNOSIS — E876 Hypokalemia: Secondary | ICD-10-CM | POA: Diagnosis present

## 2024-07-05 DIAGNOSIS — Z7902 Long term (current) use of antithrombotics/antiplatelets: Secondary | ICD-10-CM | POA: Diagnosis not present

## 2024-07-05 DIAGNOSIS — K900 Celiac disease: Secondary | ICD-10-CM | POA: Diagnosis present

## 2024-07-05 DIAGNOSIS — Z7901 Long term (current) use of anticoagulants: Secondary | ICD-10-CM

## 2024-07-05 DIAGNOSIS — E871 Hypo-osmolality and hyponatremia: Secondary | ICD-10-CM | POA: Diagnosis present

## 2024-07-05 DIAGNOSIS — R072 Precordial pain: Secondary | ICD-10-CM | POA: Diagnosis present

## 2024-07-05 DIAGNOSIS — I213 ST elevation (STEMI) myocardial infarction of unspecified site: Principal | ICD-10-CM

## 2024-07-05 DIAGNOSIS — I2102 ST elevation (STEMI) myocardial infarction involving left anterior descending coronary artery: Secondary | ICD-10-CM

## 2024-07-05 DIAGNOSIS — E861 Hypovolemia: Secondary | ICD-10-CM | POA: Diagnosis present

## 2024-07-05 DIAGNOSIS — Z8249 Family history of ischemic heart disease and other diseases of the circulatory system: Secondary | ICD-10-CM

## 2024-07-05 DIAGNOSIS — Z79899 Other long term (current) drug therapy: Secondary | ICD-10-CM | POA: Diagnosis not present

## 2024-07-05 DIAGNOSIS — I2109 ST elevation (STEMI) myocardial infarction involving other coronary artery of anterior wall: Principal | ICD-10-CM | POA: Diagnosis present

## 2024-07-05 DIAGNOSIS — Z823 Family history of stroke: Secondary | ICD-10-CM

## 2024-07-05 DIAGNOSIS — Z955 Presence of coronary angioplasty implant and graft: Secondary | ICD-10-CM

## 2024-07-05 HISTORY — PX: CORONARY/GRAFT ACUTE MI REVASCULARIZATION: CATH118305

## 2024-07-05 HISTORY — PX: LEFT HEART CATH AND CORONARY ANGIOGRAPHY: CATH118249

## 2024-07-05 LAB — CBC WITH DIFFERENTIAL/PLATELET
Abs Immature Granulocytes: 0.02 K/uL (ref 0.00–0.07)
Basophils Absolute: 0.1 K/uL (ref 0.0–0.1)
Basophils Relative: 1 %
Eosinophils Absolute: 0.1 K/uL (ref 0.0–0.5)
Eosinophils Relative: 1 %
HCT: 44.9 % (ref 36.0–46.0)
Hemoglobin: 14.8 g/dL (ref 12.0–15.0)
Immature Granulocytes: 0 %
Lymphocytes Relative: 17 %
Lymphs Abs: 1.4 K/uL (ref 0.7–4.0)
MCH: 33.8 pg (ref 26.0–34.0)
MCHC: 33 g/dL (ref 30.0–36.0)
MCV: 102.5 fL — ABNORMAL HIGH (ref 80.0–100.0)
Monocytes Absolute: 0.3 K/uL (ref 0.1–1.0)
Monocytes Relative: 4 %
Neutro Abs: 6.4 K/uL (ref 1.7–7.7)
Neutrophils Relative %: 77 %
Platelets: 204 K/uL (ref 150–400)
RBC: 4.38 MIL/uL (ref 3.87–5.11)
RDW: 13.9 % (ref 11.5–15.5)
WBC: 8.3 K/uL (ref 4.0–10.5)
nRBC: 0 % (ref 0.0–0.2)

## 2024-07-05 LAB — ECHOCARDIOGRAM COMPLETE
Area-P 1/2: 5.27 cm2
Calc EF: 32.1 %
Height: 63 in
S' Lateral: 3.5 cm
Single Plane A2C EF: 30.3 %
Single Plane A4C EF: 36.5 %
Weight: 2317.48 [oz_av]

## 2024-07-05 LAB — TROPONIN T, HIGH SENSITIVITY
Troponin T High Sensitivity: 144 ng/L (ref 0–19)
Troponin T High Sensitivity: 8138 ng/L (ref 0–19)

## 2024-07-05 LAB — BASIC METABOLIC PANEL WITH GFR
Anion gap: 8 (ref 5–15)
BUN: 9 mg/dL (ref 8–23)
CO2: 27 mmol/L (ref 22–32)
Calcium: 9 mg/dL (ref 8.9–10.3)
Chloride: 98 mmol/L (ref 98–111)
Creatinine, Ser: 0.6 mg/dL (ref 0.44–1.00)
GFR, Estimated: >60 mL/min
Glucose, Bld: 102 mg/dL — ABNORMAL HIGH (ref 70–99)
Potassium: 4.4 mmol/L (ref 3.5–5.1)
Sodium: 134 mmol/L — ABNORMAL LOW (ref 135–145)

## 2024-07-05 LAB — COMPREHENSIVE METABOLIC PANEL WITH GFR
ALT: 16 U/L (ref 0–44)
AST: 50 U/L — ABNORMAL HIGH (ref 15–41)
Albumin: 4.2 g/dL (ref 3.5–5.0)
Alkaline Phosphatase: 63 U/L (ref 38–126)
Anion gap: 11 (ref 5–15)
BUN: 10 mg/dL (ref 8–23)
CO2: 26 mmol/L (ref 22–32)
Calcium: 9.3 mg/dL (ref 8.9–10.3)
Chloride: 98 mmol/L (ref 98–111)
Creatinine, Ser: 0.74 mg/dL (ref 0.44–1.00)
GFR, Estimated: >60 mL/min
Glucose, Bld: 145 mg/dL — ABNORMAL HIGH (ref 70–99)
Potassium: 3.8 mmol/L (ref 3.5–5.1)
Sodium: 135 mmol/L (ref 135–145)
Total Bilirubin: 0.5 mg/dL (ref 0.0–1.2)
Total Protein: 7.8 g/dL (ref 6.5–8.1)

## 2024-07-05 LAB — CBC
HCT: 41.2 % (ref 36.0–46.0)
Hemoglobin: 14 g/dL (ref 12.0–15.0)
MCH: 33.7 pg (ref 26.0–34.0)
MCHC: 34 g/dL (ref 30.0–36.0)
MCV: 99.3 fL (ref 80.0–100.0)
Platelets: 187 K/uL (ref 150–400)
RBC: 4.15 MIL/uL (ref 3.87–5.11)
RDW: 13.9 % (ref 11.5–15.5)
WBC: 9.2 K/uL (ref 4.0–10.5)
nRBC: 0 % (ref 0.0–0.2)

## 2024-07-05 LAB — HEMOGLOBIN A1C
Hgb A1c MFr Bld: 5 % (ref 4.8–5.6)
Mean Plasma Glucose: 96.8 mg/dL

## 2024-07-05 LAB — CG4 I-STAT (LACTIC ACID): Lactic Acid, Venous: 0.3 mmol/L — ABNORMAL LOW (ref 0.5–1.9)

## 2024-07-05 LAB — I-STAT CG4 LACTIC ACID, ED: Lactic Acid, Venous: 0.7 mmol/L (ref 0.5–1.9)

## 2024-07-05 LAB — LIPID PANEL
Cholesterol: 144 mg/dL (ref 0–200)
HDL: 70 mg/dL
LDL Cholesterol: 62 mg/dL (ref 0–99)
Total CHOL/HDL Ratio: 2.1 ratio
Triglycerides: 61 mg/dL
VLDL: 12 mg/dL (ref 0–40)

## 2024-07-05 LAB — GLUCOSE, CAPILLARY: Glucose-Capillary: 115 mg/dL — ABNORMAL HIGH (ref 70–99)

## 2024-07-05 LAB — POCT I-STAT, CHEM 8
BUN: 10 mg/dL (ref 8–23)
Calcium, Ion: 1.16 mmol/L (ref 1.15–1.40)
Chloride: 87 mmol/L — ABNORMAL LOW (ref 98–111)
Creatinine, Ser: 0.6 mg/dL (ref 0.44–1.00)
Glucose, Bld: 127 mg/dL — ABNORMAL HIGH (ref 70–99)
HCT: 42 % (ref 36.0–46.0)
Hemoglobin: 14.3 g/dL (ref 12.0–15.0)
Potassium: 3.3 mmol/L — ABNORMAL LOW (ref 3.5–5.1)
Sodium: 126 mmol/L — ABNORMAL LOW (ref 135–145)
TCO2: 22 mmol/L (ref 22–32)

## 2024-07-05 LAB — POCT ACTIVATED CLOTTING TIME: Activated Clotting Time: 286 s

## 2024-07-05 LAB — PROTIME-INR
INR: 1 (ref 0.8–1.2)
Prothrombin Time: 14.1 s (ref 11.4–15.2)

## 2024-07-05 LAB — MRSA NEXT GEN BY PCR, NASAL: MRSA by PCR Next Gen: NOT DETECTED

## 2024-07-05 LAB — APTT: aPTT: 33 s (ref 24–36)

## 2024-07-05 MED ORDER — HEPARIN (PORCINE) IN NACL 1000-0.9 UT/500ML-% IV SOLN
INTRAVENOUS | Status: DC | PRN
  Administered 2024-07-05 (×2): 500 mL

## 2024-07-05 MED ORDER — NITROGLYCERIN 1 MG/10 ML FOR IR/CATH LAB
INTRA_ARTERIAL | Status: AC
  Filled 2024-07-05: qty 10

## 2024-07-05 MED ORDER — CHLORHEXIDINE GLUCONATE CLOTH 2 % EX PADS
6.0000 | MEDICATED_PAD | Freq: Every day | CUTANEOUS | Status: DC
  Administered 2024-07-05 – 2024-07-07 (×3): 6 via TOPICAL

## 2024-07-05 MED ORDER — VERAPAMIL HCL 2.5 MG/ML IV SOLN
INTRAVENOUS | Status: DC | PRN
  Administered 2024-07-05: 10 mL via INTRA_ARTERIAL

## 2024-07-05 MED ORDER — HEPARIN SODIUM (PORCINE) 5000 UNIT/ML IJ SOLN: 5000.0000 [IU] | Freq: Once | INTRAMUSCULAR | Status: AC

## 2024-07-05 MED ORDER — ONDANSETRON HCL 4 MG/2ML IJ SOLN
4.0000 mg | Freq: Three times a day (TID) | INTRAMUSCULAR | Status: DC | PRN
  Administered 2024-07-05: 4 mg via INTRAVENOUS
  Filled 2024-07-05: qty 2

## 2024-07-05 MED ORDER — LEVOTHYROXINE SODIUM 50 MCG PO TABS
50.0000 ug | ORAL_TABLET | Freq: Every day | ORAL | Status: DC
  Administered 2024-07-05 – 2024-07-07 (×3): 50 ug via ORAL
  Filled 2024-07-05 (×3): qty 1

## 2024-07-05 MED ORDER — SODIUM CHLORIDE 0.9 % IV SOLN: INTRAVENOUS | Status: AC

## 2024-07-05 MED ORDER — CLOPIDOGREL BISULFATE 300 MG PO TABS
ORAL_TABLET | ORAL | Status: DC | PRN
  Administered 2024-07-05: 600 mg via ORAL

## 2024-07-05 MED ORDER — LABETALOL HCL 5 MG/ML IV SOLN: 10.0000 mg | INTRAVENOUS | Status: AC | PRN

## 2024-07-05 MED ORDER — TIROFIBAN HCL IN NACL 5-0.9 MG/100ML-% IV SOLN
INTRAVENOUS | Status: AC
  Filled 2024-07-05: qty 100

## 2024-07-05 MED ORDER — POTASSIUM CHLORIDE 20 MEQ PO PACK
40.0000 meq | PACK | Freq: Once | ORAL | Status: AC
  Administered 2024-07-05: 40 meq via ORAL
  Filled 2024-07-05: qty 2

## 2024-07-05 MED ORDER — HEPARIN SODIUM (PORCINE) 5000 UNIT/ML IJ SOLN
INTRAMUSCULAR | Status: AC
  Administered 2024-07-05: 5000 [IU] via INTRAVENOUS
  Filled 2024-07-05: qty 1

## 2024-07-05 MED ORDER — CLOPIDOGREL BISULFATE 300 MG PO TABS
ORAL_TABLET | ORAL | Status: AC
  Filled 2024-07-05: qty 2

## 2024-07-05 MED ORDER — CLOPIDOGREL BISULFATE 75 MG PO TABS
75.0000 mg | ORAL_TABLET | Freq: Every day | ORAL | Status: DC
  Administered 2024-07-06 – 2024-07-07 (×2): 75 mg via ORAL
  Filled 2024-07-05 (×2): qty 1

## 2024-07-05 MED ORDER — SODIUM CHLORIDE 0.9% FLUSH
3.0000 mL | Freq: Two times a day (BID) | INTRAVENOUS | Status: DC
  Administered 2024-07-05 – 2024-07-07 (×5): 3 mL via INTRAVENOUS

## 2024-07-05 MED ORDER — SODIUM CHLORIDE 0.9% FLUSH: 3.0000 mL | INTRAVENOUS | Status: DC | PRN

## 2024-07-05 MED ORDER — HYDRALAZINE HCL 20 MG/ML IJ SOLN: 10.0000 mg | INTRAMUSCULAR | Status: AC | PRN

## 2024-07-05 MED ORDER — MIDAZOLAM HCL (PF) 2 MG/2ML IJ SOLN
INTRAMUSCULAR | Status: DC | PRN
  Administered 2024-07-05: 1 mg via INTRAVENOUS

## 2024-07-05 MED ORDER — FREE WATER
500.0000 mL | Freq: Once | Status: AC
  Administered 2024-07-05: 500 mL via ORAL

## 2024-07-05 MED ORDER — SODIUM CHLORIDE 0.9 % IV SOLN
INTRAVENOUS | Status: AC | PRN
  Administered 2024-07-05: 10 mL/h via INTRAVENOUS

## 2024-07-05 MED ORDER — POTASSIUM CHLORIDE CRYS ER 20 MEQ PO TBCR
40.0000 meq | EXTENDED_RELEASE_TABLET | Freq: Once | ORAL | Status: DC
  Filled 2024-07-05: qty 2

## 2024-07-05 MED ORDER — TIROFIBAN HCL IN NACL 5-0.9 MG/100ML-% IV SOLN
0.1500 ug/kg/min | INTRAVENOUS | Status: AC
  Administered 2024-07-05: 0.15 ug/kg/min via INTRAVENOUS
  Filled 2024-07-05: qty 100

## 2024-07-05 MED ORDER — NITROGLYCERIN 1 MG/10 ML FOR IR/CATH LAB
INTRA_ARTERIAL | Status: DC | PRN
  Administered 2024-07-05: 200 ug via INTRACORONARY

## 2024-07-05 MED ORDER — METOPROLOL SUCCINATE ER 25 MG PO TB24
25.0000 mg | ORAL_TABLET | Freq: Every day | ORAL | Status: DC
  Administered 2024-07-05: 25 mg via ORAL
  Filled 2024-07-05 (×2): qty 1

## 2024-07-05 MED ORDER — ASPIRIN 81 MG PO CHEW
324.0000 mg | CHEWABLE_TABLET | Freq: Once | ORAL | Status: AC
  Administered 2024-07-05: 324 mg via ORAL
  Filled 2024-07-05: qty 4

## 2024-07-05 MED ORDER — HEPARIN SODIUM (PORCINE) 1000 UNIT/ML IJ SOLN
INTRAMUSCULAR | Status: AC
  Filled 2024-07-05: qty 10

## 2024-07-05 MED ORDER — FREE WATER
250.0000 mL | Freq: Once | Status: AC
  Administered 2024-07-06: 250 mL via ORAL

## 2024-07-05 MED ORDER — ROSUVASTATIN CALCIUM 5 MG PO TABS
10.0000 mg | ORAL_TABLET | Freq: Every day | ORAL | Status: DC
  Administered 2024-07-05: 10 mg via ORAL
  Filled 2024-07-05: qty 2

## 2024-07-05 MED ORDER — TIROFIBAN HCL IN NACL 5-0.9 MG/100ML-% IV SOLN
INTRAVENOUS | Status: AC | PRN
  Administered 2024-07-05: .15 ug/kg/min via INTRAVENOUS

## 2024-07-05 MED ORDER — IOHEXOL 350 MG/ML SOLN
INTRAVENOUS | Status: DC | PRN
  Administered 2024-07-05: 100 mL via INTRA_ARTERIAL

## 2024-07-05 MED ORDER — HEPARIN SODIUM (PORCINE) 1000 UNIT/ML IJ SOLN
INTRAMUSCULAR | Status: DC | PRN
  Administered 2024-07-05: 3000 [IU] via INTRAVENOUS

## 2024-07-05 MED ORDER — ROSUVASTATIN CALCIUM 20 MG PO TABS
20.0000 mg | ORAL_TABLET | Freq: Every day | ORAL | Status: DC
  Administered 2024-07-06 – 2024-07-07 (×2): 20 mg via ORAL
  Filled 2024-07-05 (×2): qty 1

## 2024-07-05 MED ORDER — AMIODARONE HCL 200 MG PO TABS
100.0000 mg | ORAL_TABLET | Freq: Every day | ORAL | Status: DC
  Administered 2024-07-05 – 2024-07-07 (×3): 100 mg via ORAL
  Filled 2024-07-05 (×3): qty 1

## 2024-07-05 MED ORDER — TIMOLOL MALEATE 0.5 % OP SOLN
1.0000 [drp] | Freq: Two times a day (BID) | OPHTHALMIC | Status: DC
  Administered 2024-07-05 – 2024-07-07 (×5): 1 [drp] via OPHTHALMIC
  Filled 2024-07-05: qty 5

## 2024-07-05 MED ORDER — GABAPENTIN 300 MG PO CAPS
300.0000 mg | ORAL_CAPSULE | Freq: Every day | ORAL | Status: DC
  Administered 2024-07-05 – 2024-07-06 (×2): 300 mg via ORAL
  Filled 2024-07-05 (×2): qty 1

## 2024-07-05 MED ORDER — MORPHINE SULFATE (PF) 2 MG/ML IV SOLN: 1.0000 mg | INTRAVENOUS | Status: DC | PRN

## 2024-07-05 MED ORDER — PERFLUTREN LIPID MICROSPHERE
1.0000 mL | INTRAVENOUS | Status: AC | PRN
  Administered 2024-07-05: 2 mL via INTRAVENOUS

## 2024-07-05 MED ORDER — LIDOCAINE HCL (PF) 1 % IJ SOLN
INTRAMUSCULAR | Status: DC | PRN
  Administered 2024-07-05: 2 mL

## 2024-07-05 MED ORDER — MIDAZOLAM HCL 2 MG/2ML IJ SOLN
INTRAMUSCULAR | Status: AC
  Filled 2024-07-05: qty 2

## 2024-07-05 MED ORDER — ONDANSETRON HCL 4 MG/2ML IJ SOLN: 4.0000 mg | Freq: Three times a day (TID) | INTRAMUSCULAR | Status: DC

## 2024-07-05 MED ORDER — FENTANYL CITRATE (PF) 100 MCG/2ML IJ SOLN
INTRAMUSCULAR | Status: AC
  Filled 2024-07-05: qty 2

## 2024-07-05 MED ORDER — TIROFIBAN (AGGRASTAT) BOLUS VIA INFUSION
INTRAVENOUS | Status: DC | PRN
  Administered 2024-07-05: 1645 ug via INTRAVENOUS

## 2024-07-05 MED ORDER — OXYCODONE HCL 5 MG PO TABS: 5.0000 mg | ORAL_TABLET | ORAL | Status: DC | PRN

## 2024-07-05 MED ORDER — FENTANYL CITRATE (PF) 100 MCG/2ML IJ SOLN
INTRAMUSCULAR | Status: DC | PRN
  Administered 2024-07-05: 25 ug via INTRAVENOUS

## 2024-07-05 MED ORDER — ORAL CARE MOUTH RINSE: 15.0000 mL | OROMUCOSAL | Status: DC | PRN

## 2024-07-05 MED ORDER — VERAPAMIL HCL 2.5 MG/ML IV SOLN
INTRAVENOUS | Status: AC
  Filled 2024-07-05: qty 2

## 2024-07-05 MED ORDER — LATANOPROST 0.005 % OP SOLN
1.0000 [drp] | Freq: Every day | OPHTHALMIC | Status: DC
  Administered 2024-07-05 – 2024-07-06 (×2): 1 [drp] via OPHTHALMIC
  Filled 2024-07-05: qty 2.5

## 2024-07-05 MED ORDER — ASPIRIN 81 MG PO CHEW
81.0000 mg | CHEWABLE_TABLET | ORAL | Status: AC
  Administered 2024-07-06: 81 mg via ORAL
  Filled 2024-07-05: qty 1

## 2024-07-05 MED ORDER — METOPROLOL SUCCINATE ER 25 MG PO TB24
25.0000 mg | ORAL_TABLET | Freq: Every day | ORAL | Status: DC
  Filled 2024-07-05: qty 1

## 2024-07-05 MED ORDER — ACETAMINOPHEN 325 MG PO TABS: 650.0000 mg | ORAL_TABLET | ORAL | Status: DC | PRN

## 2024-07-05 MED ORDER — SODIUM CHLORIDE 0.9 % IV SOLN: 250.0000 mL | INTRAVENOUS | Status: AC | PRN

## 2024-07-05 MED ORDER — HEPARIN SODIUM (PORCINE) 5000 UNIT/ML IJ SOLN: 60.0000 [IU]/kg | Freq: Once | INTRAMUSCULAR | Status: DC

## 2024-07-05 MED ORDER — ASPIRIN 81 MG PO CHEW
81.0000 mg | CHEWABLE_TABLET | Freq: Every day | ORAL | Status: DC
  Administered 2024-07-07: 81 mg via ORAL
  Filled 2024-07-05 (×2): qty 1

## 2024-07-05 MED ORDER — LIDOCAINE HCL (PF) 1 % IJ SOLN
INTRAMUSCULAR | Status: AC
  Filled 2024-07-05: qty 30

## 2024-07-05 NOTE — Progress Notes (Signed)
 "  Rounding Note   Patient Name: Angela Morales Date of Encounter: 07/05/2024  [facility] Cardiologist: Jerel Balding, MD   Subjective  Minimal chest pain. No dyspnea  Scheduled Meds:  amiodarone   100 mg Oral Daily   [START ON 07/06/2024] aspirin   81 mg Oral Daily   Chlorhexidine  Gluconate Cloth  6 each Topical Daily   [START ON 07/06/2024] clopidogrel   75 mg Oral Q breakfast   gabapentin   300 mg Oral QHS   latanoprost   1 drop Left Eye QHS   levothyroxine   50 mcg Oral Q0600   metoprolol  succinate  25 mg Oral Daily   nitroGLYCERIN        rosuvastatin   10 mg Oral Daily   sodium chloride  flush  3 mL Intravenous Q12H   timolol   1 drop Left Eye BID   Continuous Infusions:  sodium chloride  10 mL/hr at 07/05/24 0700   sodium chloride      tirofiban  0.15 mcg/kg/min (07/05/24 0700)   PRN Meds: sodium chloride , acetaminophen , hydrALAZINE , labetalol , morphine  injection, nitroGLYCERIN , mouth rinse, oxyCODONE , sodium chloride  flush   Vital Signs  Vitals:   07/05/24 0600 07/05/24 0630 07/05/24 0700 07/05/24 0749  BP: 138/81 132/74 130/79   Pulse: 69 69 67   Resp: 16 20 17    Temp:    98 F (36.7 C)  TempSrc:    Oral  SpO2: 94% 96% 95%   Weight:      Height:        Intake/Output Summary (Last 24 hours) at 07/05/2024 0801 Last data filed at 07/05/2024 0700 Gross per 24 hour  Intake 640.07 ml  Output --  Net 640.07 ml      07/05/2024    2:40 AM 07/05/2024    1:00 AM 12/15/2023    9:51 AM  Last 3 Weights  Weight (lbs) 144 lb 13.5 oz 145 lb 141 lb 6.4 oz  Weight (kg) 65.7 kg 65.772 kg 64.139 kg      Telemetry Sinus - Personally Reviewed  ECG   Sinus rhythm. T wave abn.  - Personally Reviewed  Physical Exam  GEN: No acute distress.   Neck: No JVD Cardiac: RRR, no murmurs, rubs, or gallops.  Respiratory: Clear to auscultation bilaterally. GI: Soft, nontender, non-distended  MS: No edema; No deformity. Neuro:  Nonfocal  Psych: Normal affect   Labs High  Sensitivity Troponin:  No results for input(s): TROPONINIHS in the last 720 hours.  Recent Labs  Lab 07/05/24 0111  TRNPT 144*       Chemistry Recent Labs  Lab 07/05/24 0111 07/05/24 0158  NA 135 126*  K 3.8 3.3*  CL 98 87*  CO2 26  --   GLUCOSE 145* 127*  BUN 10 10  CREATININE 0.74 0.60  CALCIUM  9.3  --   PROT 7.8  --   ALBUMIN 4.2  --   AST 50*  --   ALT 16  --   ALKPHOS 63  --   BILITOT 0.5  --   GFRNONAA >60  --   ANIONGAP 11  --     Lipids  Recent Labs  Lab 07/05/24 0111  CHOL 144  TRIG 61  HDL 70  LDLCALC 62  CHOLHDL 2.1    Hematology Recent Labs  Lab 07/05/24 0111 07/05/24 0158  WBC 8.3  --   RBC 4.38  --   HGB 14.8 14.3  HCT 44.9 42.0  MCV 102.5*  --   MCH 33.8  --   MCHC 33.0  --  RDW 13.9  --   PLT 204  --    Thyroid  No results for input(s): TSH, FREET4 in the last 168 hours.  BNPNo results for input(s): BNP, PROBNP in the last 168 hours.  DDimer No results for input(s): DDIMER in the last 168 hours.   Radiology  CARDIAC CATHETERIZATION Result Date: 07/05/2024   Prox RCA lesion is 80% stenosed.   Mid LAD lesion is 100% stenosed.   A drug-eluting stent was successfully placed using a STENT SYNERGY XD 2.75X16.   Post intervention, there is a 0% residual stenosis. Acute anterior STEMI secondary to occlusion of the proximal to mid LAD Successful PTCA/DES x 1 proximal to mid LAD No obstructive disease noted in the Circumflex Large dominant RCA with severe mid stenosis (calcified) LVEDP=18 mmHg Recommendations: Will admit to the ICU. Aggrastat  drip for 6 hours since she was loaded with Plavix  (she takes Eliquis  at home). Continue DAPT with ASA and Plavix  for one month then stop ASA. Echo today. Will plan staged PCI of the RCA on Friday. Continue statin. Will try to push up dosing of her Crestor .    Patient Profile   79 y.o. female with PAF admitted with acute anterior STEMI early 07/05/24. Occluded LAD treated with a DES. Severe RCA  stenosis will need staged PCI.   Assessment & Plan    CAD/Anterior STEMI: Pt doing well this am, now 6 hours post MI. No chest pain. -Continue DAPT with ASA/Plavix  for one month then will stop ASA (she will be on Eliquis  post discharge home).  -Continue statin -Echo today -Staged PCI of the RCA tomorrow. Pt added to schedule and orders placed.   PAF: Sinus today. Will restart Eliquis  post staged PCI of the RCA.   Hypokalemia: replace K today   For questions or updates, please contact [facility] Please consult [email or web address] for contact info under  Signed, Lonni Cash, MD  07/05/2024, 8:01 AM    "

## 2024-07-05 NOTE — ED Provider Notes (Signed)
 "  Emergency Department Provider Note   I have reviewed the triage vital signs and the nursing notes.   HISTORY  Chief Complaint Chest Pain   HPI Angela Morales is a 79 y.o. female with past history of A-fib on Eliquis  presents to the emergency department with chest pain.  She arrived by EMS.  Symptoms began at around 1830 with midsternal chest discomfort radiating to the back.  She has some pain to the left shoulder and neck.  No shortness of breath. No nausea. No abdominal pain.   Past Medical History:  Diagnosis Date   Abnormal uterine bleeding    on HRT   Allergy    seasonal   Arthritis    Atrial fibrillation (HCC)    Celiac disease    DDD (degenerative disc disease)    Dysrhythmia    GBS (Guillain-Barre syndrome)    Glaucoma    Microscopic colitis    Osteoarthritis of both knees    PONV (postoperative nausea and vomiting)    SVT (supraventricular tachycardia)     Review of Systems  Constitutional: No fever/chills Cardiovascular: Positive chest pain. Respiratory: Denies shortness of breath. Gastrointestinal: No abdominal pain.  No nausea, no vomiting. Neurological: Negative for headaches.  ____________________________________________   PHYSICAL EXAM:  VITAL SIGNS: Vitals:   07/05/24 0430 07/05/24 0500  BP: (!) 141/79 137/77  Pulse: 70 68  Resp: 16 19  Temp:    SpO2: 94% 95%    Constitutional: Alert and oriented. Well appearing and in no acute distress. Eyes: Conjunctivae are normal.  Head: Atraumatic. Nose: No congestion/rhinnorhea. Mouth/Throat: Mucous membranes are moist.  Neck: No stridor.   Cardiovascular: Normal rate, regular rhythm. Good peripheral circulation. Grossly normal heart sounds.   Respiratory: Normal respiratory effort.  No retractions. Lungs CTAB. Gastrointestinal: Soft and nontender. No distention.  Musculoskeletal: No gross deformities of extremities. Neurologic:  Normal speech and language.  Skin:  Skin is warm, dry and  intact. No rash noted.   ____________________________________________   LABS (all labs ordered are listed, but only abnormal results are displayed)  Labs Reviewed  CBC WITH DIFFERENTIAL/PLATELET - Abnormal; Notable for the following components:      Result Value   MCV 102.5 (*)    All other components within normal limits  COMPREHENSIVE METABOLIC PANEL WITH GFR - Abnormal; Notable for the following components:   Glucose, Bld 145 (*)    AST 50 (*)    All other components within normal limits  GLUCOSE, CAPILLARY - Abnormal; Notable for the following components:   Glucose-Capillary 115 (*)    All other components within normal limits  POCT I-STAT, CHEM 8 - Abnormal; Notable for the following components:   Sodium 126 (*)    Potassium 3.3 (*)    Chloride 87 (*)    Glucose, Bld 127 (*)    All other components within normal limits  CG4 I-STAT (LACTIC ACID) - Abnormal; Notable for the following components:   Lactic Acid, Venous 0.3 (*)    All other components within normal limits  TROPONIN T, HIGH SENSITIVITY - Abnormal; Notable for the following components:   Troponin T High Sensitivity 144 (*)    All other components within normal limits  MRSA NEXT GEN BY PCR, NASAL  HEMOGLOBIN A1C  PROTIME-INR  APTT  LIPID PANEL  BASIC METABOLIC PANEL WITH GFR  CBC  LIPOPROTEIN A (LPA)  I-STAT CG4 LACTIC ACID, ED  POCT ACTIVATED CLOTTING TIME  TROPONIN T, HIGH SENSITIVITY   ____________________________________________  EKG   EKG Interpretation Date/Time:  Thursday July 05 2024 01:16:28 EST Ventricular Rate:  71 PR Interval:  230 QRS Duration:  82 QT Interval:  410 QTC Calculation: 445 R Axis:   58  Text Interpretation: Sinus rhythm with 1st degree A-V block Septal infarct , age undetermined Abnormal ECG When compared with ECG of 05-Jul-2024 01:00, PREVIOUS ECG IS PRESENT ** ** ACUTE MI / STEMI ** ** Confirmed by Darra Chew [phone]) on 07/05/2024 1:19:08 AM         ____________________________________________  RADIOLOGY  CARDIAC CATHETERIZATION Result Date: 07/05/2024   Prox RCA lesion is 80% stenosed.   Mid LAD lesion is 100% stenosed.   A drug-eluting stent was successfully placed using a STENT SYNERGY XD 2.75X16.   Post intervention, there is a 0% residual stenosis. Acute anterior STEMI secondary to occlusion of the proximal to mid LAD Successful PTCA/DES x 1 proximal to mid LAD No obstructive disease noted in the Circumflex Large dominant RCA with severe mid stenosis (calcified) LVEDP=18 mmHg Recommendations: Will admit to the ICU. Aggrastat  drip for 6 hours since she was loaded with Plavix  (she takes Eliquis  at home). Continue DAPT with ASA and Plavix  for one month then stop ASA. Echo today. Will plan staged PCI of the RCA on Friday. Continue statin. Will try to push up dosing of her Crestor .    ____________________________________________   PROCEDURES  Procedure(s) performed:   Procedures  CRITICAL CARE Performed by: Chew KANDICE Darra Total critical care time: 35 minutes Critical care time was exclusive of separately billable procedures and treating other patients. Critical care was necessary to treat or prevent imminent or life-threatening deterioration. Critical care was time spent personally by me on the following activities: development of treatment plan with patient and/or surrogate as well as nursing, discussions with consultants, evaluation of patient's response to treatment, examination of patient, obtaining history from patient or surrogate, ordering and performing treatments and interventions, ordering and review of laboratory studies, ordering and review of radiographic studies, pulse oximetry and re-evaluation of patient's condition.  Chew Darra, MD Emergency Medicine  ____________________________________________   INITIAL IMPRESSION / ASSESSMENT AND PLAN / ED COURSE  Pertinent labs & imaging results that were available  during my care of the patient were reviewed by me and considered in my medical decision making (see chart for details).   This patient is Presenting for Evaluation of CP, which does require a range of treatment options, and is a complaint that involves a high risk of morbidity and mortality.  The Differential Diagnoses includes but is not exclusive to acute coronary syndrome, aortic dissection, pulmonary embolism, cardiac tamponade, community-acquired pneumonia, pericarditis, musculoskeletal chest wall pain, etc.   Critical Interventions-    Medications  0.9 %  sodium chloride  infusion ( Intravenous Infusion Verify 07/05/24 0500)  nitroGLYCERIN  100 mcg/mL intra-arterial injection (has no administration in time range)  amiodarone  (PACERONE ) tablet 100 mg (has no administration in time range)  gabapentin  (NEURONTIN ) capsule 300 mg (has no administration in time range)  levothyroxine  (SYNTHROID ) tablet 50 mcg (50 mcg Oral Given 07/05/24 0603)  metoprolol  succinate (TOPROL -XL) 24 hr tablet 25 mg (has no administration in time range)  rosuvastatin  (CRESTOR ) tablet 10 mg (has no administration in time range)  timolol  (TIMOPTIC ) 0.5 % ophthalmic solution 1 drop (has no administration in time range)  latanoprost  (XALATAN ) 0.005 % ophthalmic solution 1 drop (has no administration in time range)  labetalol  (NORMODYNE ) injection 10 mg (has no administration in time range)  hydrALAZINE  (APRESOLINE )  injection 10 mg (has no administration in time range)  acetaminophen  (TYLENOL ) tablet 650 mg (has no administration in time range)  sodium chloride  flush (NS) 0.9 % injection 3 mL (has no administration in time range)  sodium chloride  flush (NS) 0.9 % injection 3 mL (has no administration in time range)  0.9 %  sodium chloride  infusion (has no administration in time range)  oxyCODONE  (Oxy IR/ROXICODONE ) immediate release tablet 5-10 mg (has no administration in time range)  morphine  (PF) 2 MG/ML injection 1 mg  (has no administration in time range)  aspirin  chewable tablet 81 mg (has no administration in time range)  clopidogrel  (PLAVIX ) tablet 75 mg (has no administration in time range)  tirofiban  (AGGRASTAT ) infusion 50 mcg/mL 100 mL (0.15 mcg/kg/min  65.8 kg Intravenous New Bag/Given 07/05/24 0608)  Chlorhexidine  Gluconate Cloth 2 % PADS 6 each (has no administration in time range)  Oral care mouth rinse (has no administration in time range)  aspirin  chewable tablet 324 mg (324 mg Oral Given 07/05/24 0130)  heparin  injection 5,000 Units (5,000 Units Intravenous Given 07/05/24 0127)  tirofiban  (AGGRASTAT ) infusion 50 mcg/mL 100 mL (0 mcg/kg/min  65.8 kg  Stopped 07/05/24 0314)  0.9 %  sodium chloride  infusion (0 mLs  Stopped 07/05/24 0318)  free water  500 mL (500 mLs Oral Given 07/05/24 0355)    Reassessment after intervention: pain improved.    I did obtain Additional Historical Information from EMS.   I decided to review pertinent External Data, and in summary patient with A fib history of Eliquis .    Clinical Laboratory Tests Ordered, included No AKI. CBC without anemia.   Cardiac Monitor Tracing which shows NSR.    Social Determinants of Health Risk patient is a non-smoker.   Consult complete with Cardiology as a CODE STEMI.   Medical Decision Making: Summary:  Patient presents to the ED with CP. EKG on arrival shows lateral ST elevation. Patient with active CP. Activated code STEMI.   Reevaluation with update and discussion with patient. Plan for cath labs emergently.   Patient's presentation is most consistent with acute presentation with potential threat to life or bodily function.   Disposition: cath lab/admit  ____________________________________________  FINAL CLINICAL IMPRESSION(S) / ED DIAGNOSES  Final diagnoses:  ST elevation myocardial infarction (STEMI), unspecified artery (HCC)    Note:  This document was prepared using Dragon voice recognition software and may include  unintentional dictation errors.  Fonda Law, MD, [facility] Emergency Medicine    Mervin Ramires, Fonda MATSU, MD 07/05/24 [phone]  "

## 2024-07-05 NOTE — ED Triage Notes (Signed)
 BIB [facility] for chest pain that started at approx 1830. Pt c/o midsternal chest pain and  back pain that is radiating to back and left shoulder and lt neck. Refused 324 aspoirin r/t pt taking eliquis  this evening and pt refused nitroglycerin , zofran  and morphine  because she is worried they will interact with medications.   BP 154/98 HR 72 Spo2 98% RA CBG 158

## 2024-07-05 NOTE — Plan of Care (Signed)

## 2024-07-05 NOTE — Progress Notes (Signed)
 CARDIAC REHAB PHASE I   Post MI/stent education including stent card, restrictions, risk factors, exercise guidelines, antiplatelet therapy importance, MI booklet, NTG use if ordered, heart healthy diet, and CRP2 reviewed. All questions and concerns addressed. Will refer to [facility] for CRP2.      2:50-3:15 Isaiah JAYSON Liverpool, RN BSN 07/05/2024 3:15 PM

## 2024-07-05 NOTE — TOC CM/SW Note (Signed)
 Transition of Care [facility]) - Inpatient Brief Assessment   Patient Details  Name: Angela Morales MRN: [id number] Date of Birth: Aug 27, 1945  Transition of Care [facility]) CM/SW Contact:    Sudie Erminio Deems, RN Phone Number: 07/05/2024, 3:20 PM   Clinical Narrative: Patient presented for chest pain. PTA patient was independent from home with spouse. Patient states her PCP is Dr. Clarice and she does not have any issues with transportation. Per notes, plan for staged PCI tomorrow. ICM will continue to follow for additional needs as the patient progresses.    Transition of Care Asessment: Insurance and Status: Insurance coverage has been reviewed Patient has primary care physician: Yes Home environment has been reviewed: reviewed Prior level of function:: independent Prior/Current Home Services: No current home services Social Drivers of Health Review: SDOH reviewed no interventions necessary Readmission risk has been reviewed: Yes Transition of care needs: no transition of care needs at this time

## 2024-07-05 NOTE — H&P (Addendum)
 "  Cardiology Admission History and Physical   Patient ID: Angela Morales MRN: [id number]; DOB: 12-Jun-1946   Admission date: 07/05/2024  PCP:  Angela Nottingham, MD   Angela Morales Cardiologist:  Angela Balding, MD  Electrophysiologist:  Angela Gladis Norton, MD      Chief Complaint: Chest pain  Patient Profile: Angela Morales is a 79 y.o. female with history of Guillain-Barr syndrome, paroxysmal atrial fibrillation who is being seen 07/05/2024 for the evaluation of chest pain found to have a STEMI status post LAD PCI.  History of Present Illness: Angela Morales is a 79 year old female with a history notable for paroxysmal atrial fibrillation on Eliquis , prior Guillain-Barr syndrome who presented to the emergency department the setting of acute onset atypical angina.  States that after she ate dinner last night, developed very severe abdominal discomfort that she initially thought was secondary to gastroesophageal reflux.  She subsequently developed severe onset nausea with vomiting and back pain-such that she called EMS for urgent evaluation.  On arrival, ECG was concerning therefore brought to the ED under the context of an acute MI.  Cardiology consulted in the ED-repeat EKG performed at the bedside demonstrating elevations in leads V3 and V4 with septal Q waves concerning for a STEMI.  Cath Lab activated urgently for coronary evaluation-which she was noted to have an acute occlusion of the LAD requiring 1 drug-eluting stent.   Past Medical History:  Diagnosis Date   Abnormal uterine bleeding    on HRT   Allergy    seasonal   Arthritis    Atrial fibrillation (HCC)    Celiac disease    DDD (degenerative disc disease)    Dysrhythmia    GBS (Guillain-Barre syndrome)    Glaucoma    Microscopic colitis    Osteoarthritis of both knees    PONV (postoperative nausea and vomiting)    SVT (supraventricular tachycardia)    Past Surgical History:  Procedure Laterality  Date   APPENDECTOMY     ATRIAL FIBRILLATION ABLATION N/A 01/28/2021   Procedure: ATRIAL FIBRILLATION ABLATION;  Surgeon: Morales Soyla Gladis, MD;  Location: [facility] INVASIVE CV LAB;  Service: Cardiovascular;  Laterality: N/A;   CARDIOVERSION N/A 10/30/2020   Procedure: CARDIOVERSION;  Surgeon: Jeffrie Oneil BROCKS, MD;  Location: [facility] ENDOSCOPY;  Service: Cardiovascular;  Laterality: N/A;   CARDIOVERSION N/A 03/16/2021   Procedure: CARDIOVERSION;  Surgeon: Alveta Aleene PARAS, MD;  Location: [facility] ENDOSCOPY;  Service: Cardiovascular;  Laterality: N/A;   CARDIOVERSION N/A 01/31/2023   Procedure: CARDIOVERSION;  Surgeon: Jeffrie Oneil BROCKS, MD;  Location: [facility] INVASIVE CV LAB;  Service: Cardiovascular;  Laterality: N/A;   COLONOSCOPY     FOOT FRACTURE SURGERY  10/2013   TONSILLECTOMY AND ADENOIDECTOMY     TOTAL KNEE ARTHROPLASTY Left 03/22/2016   Procedure: TOTAL KNEE ARTHROPLASTY;  Surgeon: Norleen Gavel, MD;  Location: [facility] OR;  Service: Orthopedics;  Laterality: Left;   TUBAL LIGATION  1981   VAGINAL HYSTERECTOMY  04/20/05   prolapse, adenomyosis     Medications Prior to Admission: Prior to Admission medications  Medication Sig Start Date End Date Taking? Authorizing Provider  amiodarone  (PACERONE ) 200 MG tablet Take 0.5 tablets (100 mg total) by mouth every Monday, Tuesday, Wednesday, Thursday, and Friday. 12/15/23   Fenton, Clint R, PA  Cholecalciferol 50 MCG (2000 UT) CAPS Take 2,000 Units by mouth daily. 02/08/22   [provider]  [facility] MAGNESIUM  GLYCINATE PO Take 1 capsule by mouth daily at 6 (six) AM.  [provider]  ELIQUIS  5 MG TABS tablet TAKE 1 TABLET BY MOUTH TWICE A DAY 07/03/21   Camnitz, Soyla Lunger, MD  gabapentin  (NEURONTIN ) 300 MG capsule TAKE 1 CAPSULE BY MOUTH EVERYDAY AT BEDTIME 08/10/22   Lovorn, Duwaine, MD  levothyroxine  (SYNTHROID ) 50 MCG tablet Take 50 mcg by mouth daily before breakfast.    [provider]  metoprolol  succinate (TOPROL  XL) 25 MG 24 hr tablet Take 1 tablet  (25 mg total) by mouth daily. 03/23/24 03/23/25  Fenton, Clint R, PA  polyethylene glycol (MIRALAX  / GLYCOLAX ) 17 g packet Take 17 g by mouth daily as needed. 04/08/21   Love, Sharlet RAMAN, PA-C  Propylene Glycol (SYSTANE COMPLETE) 0.6 % SOLN Place 1 drop into both eyes as needed (dry eyes).    [provider]  rosuvastatin  (CRESTOR ) 10 MG tablet Take 10 mg by mouth 3 (three) times a week. 12/08/23   [provider]  timolol  (TIMOPTIC ) 0.5 % ophthalmic solution Place 1 drop into the left eye 2 (two) times daily. 03/20/21   Caleen Qualia, MD  ZIOPTAN  0.0015 % SOLN Place 1 drop into the left eye at bedtime. 03/20/21   Caleen Qualia, MD     Allergies:   Allergies[1]  Social History:   Social History   Socioeconomic History   Marital status: Married    Spouse name: [name]   Number of children: 3   Years of education: Not on file   Highest education level: Not on file  Occupational History   Occupation: retired  Tobacco Use   Smoking status: Never   Smokeless tobacco: Never   Tobacco comments:    Never smoked 02/10/23  Vaping Use   Vaping status: Never Used  Substance and Sexual Activity   Alcohol  use: Yes    Alcohol /week: 7.0 standard drinks of alcohol     Types: 7 Glasses of wine per week    Comment: 1 glass of wine with dinner 02/10/23   Drug use: No   Sexual activity: Yes    Partners: Male    Birth control/protection: Surgical    Comment: TVH  Other Topics Concern   Not on file  Social History Narrative   Lives with husband and son   Left handed   Caffeine: 1 cup of coffee a day   Social Drivers of Health   Tobacco Use: Low Risk (12/15/2023)   Patient History    Smoking Tobacco Use: Never    Smokeless Tobacco Use: Never    Passive Exposure: Not on file  Financial Resource Strain: Not on file  Food Insecurity: Not on file  Transportation Needs: Not on file  Physical Activity: Not on file  Stress: Not on file  Social Connections: Not on file  Intimate  Partner Violence: Not on file  Depression (PHQ2-9): Low Risk (08/27/2022)   Depression (PHQ2-9)    PHQ-2 Score: 0  Alcohol  Screen: Not on file  Housing: Not on file  Utilities: Not on file  Health Literacy: Not on file     Family History:   The patient's family history includes Bipolar disorder in her son; Breast cancer in her cousin; Heart disease (age of onset: 106) in her brother; Liver disease in her son; Osteoarthritis in her maternal grandmother; Osteoporosis in her mother; Prostate cancer in her father; Stroke in her mother. There is no history of Colon cancer.    ROS:  Please see the history of present illness.  All other ROS reviewed and negative.  Physical Exam/Data: Vitals:   07/05/24 0205 07/05/24 0210 07/05/24 0215 07/05/24 0220  BP: 123/81 133/65 125/77 125/77  Pulse: 75 66 66 (!) 0  Resp: 17 11 17 17   SpO2: 98% 96% 92% 93%  Weight:      Height:       No intake or output data in the 24 hours ending 07/05/24 0231    07/05/2024    1:00 AM 12/15/2023    9:51 AM 08/15/2023    9:05 AM  Last 3 Weights  Weight (lbs) 145 lb 141 lb 6.4 oz 142 lb 3.2 oz  Weight (kg) 65.772 kg 64.139 kg 64.501 kg     Body mass index is 25.69 kg/m.  General: Uncomfortable appearing female, sitting in bed in no acute distress Neck: no appreciable JVD Cardiac:  normal S1, S2; RRR; no murmur  Lungs:  clear to auscultation bilaterally, no wheezing, rhonchi or rales  Abd: soft, nontender, no hepatomegaly  Ext: no edema Musculoskeletal: No gross musculoskeletal deformities Skin: warm and dry  Neuro: Alert and conversant with spontaneous movement of all 4 extremities Psych:  Normal affect   EKG:  The ECG that was done in the emergency department was personally reviewed and demonstrates sinus rhythm, anterior ST elevations with septal Q waves consistent with acute myocardial injury (STEMI)  Relevant CV Studies: - Sodium 126, potassium 3.3, lactate 0.3, creatinine 0.6 - CBC without  abnormalities - Coronary angiography revealing a mid LAD acute occlusion-status post 1 drug-eluting stent  Laboratory Data: High Sensitivity Troponin:  No results for input(s): TROPONINIHS in the last 720 hours.    Chemistry Recent Labs  Lab 07/05/24 0111 07/05/24 0158  NA 135 126*  K 3.8 3.3*  CL 98 87*  CO2 26  --   GLUCOSE 145* 127*  BUN 10 10  CREATININE 0.74 0.60  CALCIUM  9.3  --   GFRNONAA >60  --   ANIONGAP 11  --     Recent Labs  Lab 07/05/24 0111  PROT 7.8  ALBUMIN 4.2  AST 50*  ALT 16  ALKPHOS 63  BILITOT 0.5   Lipids  Recent Labs  Lab 07/05/24 0111  CHOL 144  TRIG 61  HDL 70  LDLCALC 62  CHOLHDL 2.1   Hematology Recent Labs  Lab 07/05/24 0111 07/05/24 0158  WBC 8.3  --   RBC 4.38  --   HGB 14.8 14.3  HCT 44.9 42.0  MCV 102.5*  --   MCH 33.8  --   MCHC 33.0  --   RDW 13.9  --   PLT 204  --    Thyroid  No results for input(s): TSH, FREET4 in the last 168 hours. BNPNo results for input(s): BNP, PROBNP in the last 168 hours.  DDimer No results for input(s): DDIMER in the last 168 hours.  Radiology/Studies:  CARDIAC CATHETERIZATION Result Date: 07/05/2024   Prox RCA lesion is 80% stenosed.   Mid LAD lesion is 100% stenosed.   A drug-eluting stent was successfully placed using a STENT SYNERGY XD [id number].   Post intervention, there is a 0% residual stenosis. Acute anterior STEMI secondary to occlusion of the proximal to mid LAD Successful PTCA/DES x 1 proximal to mid LAD No obstructive disease noted in the Circumflex Large dominant RCA with severe mid stenosis (calcified) LVEDP=18 mmHg Recommendations: Angela admit to the ICU. Aggrastat  drip for 6 hours since she was loaded with Plavix  (she takes Eliquis  at home). Continue DAPT with ASA and Plavix  for one month then  stop ASA. Echo today. Angela plan staged PCI of the RCA on Friday. Continue statin. Angela try to push up dosing of her Crestor .     Assessment and Plan: Acute anterior STEMI,  status post LAD PCI At this time, she is currently hemodynamically stable after percutaneous coronary intervention for LAD STEMI.  Loaded with aspirin  and Plavix  for dual antiplatelet therapy.  Currently without evidence of cardiogenic shock, acute heart failure or arrhythmias. - Continue aspirin  81 mg daily - Continue Plavix  75 mg daily - Aggrastat  x 6 hours - Formal echocardiogram in the morning - High intensity statin - Telemetry in the CCU - Cardiac rehab referral at the time of discharge - Risk modification labs pending:, A1c Abrol XL 25 mg daily  2.  History of paroxysmal A-fib - Apixaban  held for now in the immediate postoperative period while TR band in place, Angela likely need discussion with interventional cardiology regarding plan for DAPT in the setting of known paroxysmal A-fib -Continue home amiodarone  100 mg Monday through Friday  3.  Hyponatremia Likely multifactorial-hypovolemic hyponatremia in the setting of vomiting seems likely which is supported by concomitant hypokalemia.  Angela continue to monitor as her normal discomfort improves status post PCI  4.  Hypokalemia -Replete to greater than 4     Risk Assessment/Risk Scores:   TIMI Risk Score for ST  Elevation MI:   The patient's TIMI risk score is 5, which indicates a 12.4% risk of all cause mortality at 30 days.      CHA2DS2-VASc Score = 3  The patient's score is based upon: CHF History: 0 HTN History: 0 Diabetes History: 0 Stroke History: 0 Vascular Disease History: 0 Age Score: 2 Gender Score: 1         Code Status: Full Code  Severity of Illness: The appropriate patient status for this patient is INPATIENT. Inpatient status is judged to be reasonable and necessary in order to provide the required intensity of service to ensure the patient's safety. The patient's presenting symptoms, physical exam findings, and initial radiographic and laboratory data in the context of their chronic comorbidities  is felt to place them at high risk for further clinical deterioration. Furthermore, it is not anticipated that the patient Angela be medically stable for discharge from the hospital within 2 midnights of admission.   * I certify that at the point of admission it is my clinical judgment that the patient Angela require inpatient hospital care spanning beyond 2 midnights from the point of admission due to high intensity of service, high risk for further deterioration and high frequency of surveillance required.*  For questions or updates, please contact [facility] Please consult [email or web address] for contact info under     Signed, Dorn Kapur, MD  07/05/2024 2:31 AM   I have personally seen and examined this patient. I agree with the assessment and plan as outlined above.  79 yo female with history of PAF admitted with acute anterior STEMI Cardiac cath with occluded proximal to mid LAD Successful PTCA/DES x 1 proximal to mid LAD. Also with high grade mid RCA stenosis.  My exam:   General: Well developed, well nourished, NAD  SKIN: warm, dry. Neuro: No focal deficits  Psychiatric: Mood and affect normal  Neck: No JVD Lungs:Clear bilaterally, no wheezes, rhonci, crackles Cardiovascular: Regular rate and rhythm. No murmurs, gallops or rubs. Abdomen:Soft.  Extremities: No lower extremity edema.    Admit to ICU. ASA/Plavix  for one year. Echo today. Continue statin (increase Crestor   to 10 mg daily and if she tolerates Angela titrate further). Plan staged PCI of the RCA Friday.   Lonni Cash, MD, [facility] 07/05/2024 8:00 AM      [1]  Allergies Allergen Reactions   Gluten Meal Other (See Comments)    celiac disease   Lactose Intolerance (Gi) Other (See Comments)    Gas and bloating   Cefdinir Rash   Robaxin  [Methocarbamol ] Other (See Comments)    Dizziness    "

## 2024-07-06 ENCOUNTER — Encounter (HOSPITAL_COMMUNITY): Payer: Self-pay | Admitting: Cardiovascular Disease

## 2024-07-06 ENCOUNTER — Encounter (HOSPITAL_COMMUNITY): Admission: EM | Disposition: A | Payer: Self-pay | Source: Home / Self Care | Attending: Cardiovascular Disease

## 2024-07-06 DIAGNOSIS — I5021 Acute systolic (congestive) heart failure: Secondary | ICD-10-CM

## 2024-07-06 DIAGNOSIS — I255 Ischemic cardiomyopathy: Secondary | ICD-10-CM

## 2024-07-06 DIAGNOSIS — I480 Paroxysmal atrial fibrillation: Secondary | ICD-10-CM

## 2024-07-06 DIAGNOSIS — I2510 Atherosclerotic heart disease of native coronary artery without angina pectoris: Secondary | ICD-10-CM

## 2024-07-06 HISTORY — PX: CORONARY STENT INTERVENTION: CATH118234

## 2024-07-06 LAB — BASIC METABOLIC PANEL WITH GFR
Anion gap: 9 (ref 5–15)
BUN: 10 mg/dL (ref 8–23)
CO2: 24 mmol/L (ref 22–32)
Calcium: 8.8 mg/dL — ABNORMAL LOW (ref 8.9–10.3)
Chloride: 100 mmol/L (ref 98–111)
Creatinine, Ser: 0.57 mg/dL (ref 0.44–1.00)
GFR, Estimated: >60 mL/min
Glucose, Bld: 122 mg/dL — ABNORMAL HIGH (ref 70–99)
Potassium: 4.1 mmol/L (ref 3.5–5.1)
Sodium: 134 mmol/L — ABNORMAL LOW (ref 135–145)

## 2024-07-06 LAB — CBC
HCT: 41.8 % (ref 36.0–46.0)
Hemoglobin: 14 g/dL (ref 12.0–15.0)
MCH: 33.7 pg (ref 26.0–34.0)
MCHC: 33.5 g/dL (ref 30.0–36.0)
MCV: 100.7 fL — ABNORMAL HIGH (ref 80.0–100.0)
Platelets: 177 K/uL (ref 150–400)
RBC: 4.15 MIL/uL (ref 3.87–5.11)
RDW: 14.1 % (ref 11.5–15.5)
WBC: 16.9 K/uL — ABNORMAL HIGH (ref 4.0–10.5)
nRBC: 0 % (ref 0.0–0.2)

## 2024-07-06 LAB — LIPOPROTEIN A (LPA): Lipoprotein (a): 9.2 nmol/L

## 2024-07-06 MED ORDER — HEPARIN (PORCINE) IN NACL 1000-0.9 UT/500ML-% IV SOLN
INTRAVENOUS | Status: DC | PRN
  Administered 2024-07-06 (×2): 500 mL

## 2024-07-06 MED ORDER — HYDRALAZINE HCL 20 MG/ML IJ SOLN: 10.0000 mg | INTRAMUSCULAR | Status: AC | PRN

## 2024-07-06 MED ORDER — SODIUM CHLORIDE 0.9% FLUSH: 3.0000 mL | INTRAVENOUS | Status: DC | PRN

## 2024-07-06 MED ORDER — FENTANYL CITRATE (PF) 100 MCG/2ML IJ SOLN
INTRAMUSCULAR | Status: AC
  Filled 2024-07-06: qty 2

## 2024-07-06 MED ORDER — HEPARIN SODIUM (PORCINE) 1000 UNIT/ML IJ SOLN
INTRAMUSCULAR | Status: AC
  Filled 2024-07-06: qty 10

## 2024-07-06 MED ORDER — SODIUM CHLORIDE 0.9 % IV SOLN: 250.0000 mL | INTRAVENOUS | Status: DC | PRN

## 2024-07-06 MED ORDER — LABETALOL HCL 5 MG/ML IV SOLN: 10.0000 mg | INTRAVENOUS | Status: AC | PRN

## 2024-07-06 MED ORDER — FENTANYL CITRATE (PF) 100 MCG/2ML IJ SOLN
INTRAMUSCULAR | Status: DC | PRN
  Administered 2024-07-06: 25 ug via INTRAVENOUS

## 2024-07-06 MED ORDER — FREE WATER
500.0000 mL | Freq: Once | Status: AC
  Administered 2024-07-06: 500 mL via ORAL

## 2024-07-06 MED ORDER — HEPARIN SODIUM (PORCINE) 1000 UNIT/ML IJ SOLN
INTRAMUSCULAR | Status: DC | PRN
  Administered 2024-07-06: 6000 [IU] via INTRAVENOUS
  Administered 2024-07-06: 2000 [IU] via INTRAVENOUS

## 2024-07-06 MED ORDER — LIDOCAINE HCL (PF) 1 % IJ SOLN
INTRAMUSCULAR | Status: DC | PRN
  Administered 2024-07-06: 5 mL via INTRADERMAL

## 2024-07-06 MED ORDER — MIDAZOLAM HCL 2 MG/2ML IJ SOLN
INTRAMUSCULAR | Status: AC
  Filled 2024-07-06: qty 2

## 2024-07-06 MED ORDER — MIDAZOLAM HCL (PF) 2 MG/2ML IJ SOLN
INTRAMUSCULAR | Status: DC | PRN
  Administered 2024-07-06: 1 mg via INTRAVENOUS

## 2024-07-06 MED ORDER — VERAPAMIL HCL 2.5 MG/ML IV SOLN
INTRAVENOUS | Status: DC | PRN
  Administered 2024-07-06: 10 mL via INTRA_ARTERIAL

## 2024-07-06 MED ORDER — LIDOCAINE HCL (PF) 1 % IJ SOLN
INTRAMUSCULAR | Status: AC
  Filled 2024-07-06: qty 30

## 2024-07-06 MED ORDER — VERAPAMIL HCL 2.5 MG/ML IV SOLN
INTRAVENOUS | Status: AC
  Filled 2024-07-06: qty 2

## 2024-07-06 MED ORDER — IOHEXOL 350 MG/ML SOLN
INTRAVENOUS | Status: DC | PRN
  Administered 2024-07-06: 35 mL

## 2024-07-06 MED ORDER — SODIUM CHLORIDE 0.9% FLUSH
3.0000 mL | Freq: Two times a day (BID) | INTRAVENOUS | Status: DC
  Administered 2024-07-06 – 2024-07-07 (×3): 3 mL via INTRAVENOUS

## 2024-07-06 MED ORDER — SODIUM CHLORIDE 0.9 % IV SOLN
INTRAVENOUS | Status: DC | PRN
  Administered 2024-07-06: 100 mL via INTRAVENOUS

## 2024-07-06 MED ORDER — APIXABAN 5 MG PO TABS
5.0000 mg | ORAL_TABLET | Freq: Two times a day (BID) | ORAL | Status: DC
  Administered 2024-07-07: 5 mg via ORAL
  Filled 2024-07-06: qty 1

## 2024-07-06 NOTE — Progress Notes (Signed)
" ° °  Heart Failure Stewardship Pharmacist Progress Note   PCP: Clarice Nottingham, MD PCP-Cardiologist: Jerel Balding, MD    HPI:  79 yo F with PMH of Guillian-Barre and afib.   Presented to the ED on 1/8 with chest pain and back pain radiating to the neck and shoulder. Associated with abdominal discomfort, nausea, and vomiting. EMS and ED EKG concerning for MI. STEMI activated. Found to have acute anterior STEMI secondary to occlusion of the prox-midLAD s/p successful PCI with DES to mid LAD. Also noted to have severe mid stenosis in a large dominant RCA. LVEDP 18. Planning for staged PCI of RCA later today. Formal ECHO 1/8 showed LVEF 30-35%, RWMA, moderate asymmetric LVH, G3DD, RV normal, mild MR.   Denies chest pain or shortness of breath. No LE edema on exam. BP is soft but denies lightheadedness or dizziness. Plan for staged PCI later today.   Current HF Medications: Beta Blocker: metoprolol  XL 25 mg daily  Prior to admission HF Medications: Beta blocker: metoprolol  XL 25 mg daily  Pertinent Lab Values: Serum creatinine 0.57, BUN 10, Potassium 4.1, Sodium 134, A1c 5.0   Vital Signs: Weight: 142 lbs (admission weight: 144 lbs) Blood pressure: 90/50s  Heart rate: 80s  I/O: net -0.1L yesterday; net +0.3L since admission  Medication Assistance / Insurance Benefits Check: Does the patient have prescription insurance?  Yes Type of insurance plan: [facility] Medicare  Outpatient Pharmacy:  [facility] Is the patient willing to use [facility] pharmacy at discharge? Yes Is the patient willing to transition their outpatient pharmacy to utilize a [facility] outpatient pharmacy?   Pending    Assessment: 1. Acute systolic CHF (LVEF 30-35%), due to ICM. NYHA class I symptoms. - Volume status ok, LVEDP 18 on cath - Continue metoprolol  XL 25 mg daily - BP soft - consider MRA and low dose ARB pending improvement in BP - If BP does not improve, can consider adding  SGLT2i for medication optimization as this should have little to no effect on BP   Plan: 1) Medication changes recommended at this time: - Add low dose MRA and ARB if BP improves - If BP does not improve, add SGLT2i  2) Patient assistance: - [facility] Medicare  3)  Education  - Patient has been educated on current HF medications and potential additions to HF medication regimen - Patient verbalizes understanding that over the next few months, these medication doses may change and more medications may be added to optimize HF regimen - Patient has been educated on basic disease state pathophysiology and goals of therapy   Duwaine Plant, PharmD, BCPS Heart Failure Stewardship Pharmacist Phone [phone]   "

## 2024-07-06 NOTE — Interval H&P Note (Signed)
 History and Physical Interval Note:  07/06/2024 1:20 PM  Angela Morales  has presented today for surgery, with the diagnosis of cad.  The various methods of treatment have been discussed with the patient and family. After consideration of risks, benefits and other options for treatment, the patient has consented to  Procedures: CORONARY STENT INTERVENTION (N/A) as a surgical intervention.  The patient's history has been reviewed, patient examined, no change in status, stable for surgery.  I have reviewed the patient's chart and labs.  Questions were answered to the patient's satisfaction.     [name]

## 2024-07-06 NOTE — H&P (View-Only) (Signed)
 "  Rounding Note   Patient Name: Angela Morales Date of Encounter: 07/06/2024  [facility] Cardiologist: Jerel Balding, MD   Subjective  No chest pain  Scheduled Meds:  amiodarone   100 mg Oral Daily   aspirin   81 mg Oral Daily   Chlorhexidine  Gluconate Cloth  6 each Topical Daily   clopidogrel   75 mg Oral Q breakfast   gabapentin   300 mg Oral QHS   latanoprost   1 drop Left Eye QHS   levothyroxine   50 mcg Oral Q0600   metoprolol  succinate  25 mg Oral Daily   rosuvastatin   20 mg Oral Daily   sodium chloride  flush  3 mL Intravenous Q12H   timolol   1 drop Left Eye BID   Continuous Infusions:   PRN Meds: acetaminophen , morphine  injection, ondansetron  (ZOFRAN ) IV, mouth rinse, oxyCODONE , sodium chloride  flush   Vital Signs  Vitals:   07/06/24 0500 07/06/24 0600 07/06/24 0700 07/06/24 0752  BP: 107/68 112/63 (!) 87/57   Pulse: 87 71 67   Resp: 17 15    Temp:    98 F (36.7 C)  TempSrc:    Oral  SpO2: 95% 98% 98%   Weight:      Height:        Intake/Output Summary (Last 24 hours) at 07/06/2024 0800 Last data filed at 07/06/2024 0445 Gross per 24 hour  Intake 389.59 ml  Output 1150 ml  Net -760.41 ml      07/06/2024    4:00 AM 07/05/2024    2:40 AM 07/05/2024    1:00 AM  Last 3 Weights  Weight (lbs) 142 lb 3.2 oz 144 lb 13.5 oz 145 lb  Weight (kg) 64.5 kg 65.7 kg 65.772 kg      Telemetry  Sinus - Personally Reviewed  ECG   No am tracing  Physical Exam General: Well developed, well nourished, NAD  SKIN: warm, dry. Neuro: No focal deficits  Psychiatric: Mood and affect normal  Neck: No JVD Lungs:Clear bilaterally, no wheezes, rhonci, crackles Cardiovascular: Regular rate and rhythm. No murmurs, gallops or rubs. Abdomen:Soft.  Extremities: No lower extremity edema.    Labs High Sensitivity Troponin:  No results for input(s): TROPONINIHS in the last 720 hours.  Recent Labs  Lab 07/05/24 0111 07/05/24 0918  TRNPT 144* 8,138*        Chemistry Recent Labs  Lab 07/05/24 0111 07/05/24 0158 07/05/24 0918 07/06/24 0122  NA 135 126* 134* 134*  K 3.8 3.3* 4.4 4.1  CL 98 87* 98 100  CO2 26  --  27 24  GLUCOSE 145* 127* 102* 122*  BUN 10 10 9 10   CREATININE 0.74 0.60 0.60 0.57  CALCIUM  9.3  --  9.0 8.8*  PROT 7.8  --   --   --   ALBUMIN 4.2  --   --   --   AST 50*  --   --   --   ALT 16  --   --   --   ALKPHOS 63  --   --   --   BILITOT 0.5  --   --   --   GFRNONAA >60  --  >60 >60  ANIONGAP 11  --  8 9    Lipids  Recent Labs  Lab 07/05/24 0111  CHOL 144  TRIG 61  HDL 70  LDLCALC 62  CHOLHDL 2.1    Hematology Recent Labs  Lab 07/05/24 0111 07/05/24 0158 07/05/24 9081 07/06/24 0122  WBC 8.3  --  9.2 16.9*  RBC 4.38  --  4.15 4.15  HGB 14.8 14.3 14.0 14.0  HCT 44.9 42.0 41.2 41.8  MCV 102.5*  --  99.3 100.7*  MCH 33.8  --  33.7 33.7  MCHC 33.0  --  34.0 33.5  RDW 13.9  --  13.9 14.1  PLT 204  --  187 177   Thyroid  No results for input(s): TSH, FREET4 in the last 168 hours.  BNPNo results for input(s): BNP, PROBNP in the last 168 hours.  DDimer No results for input(s): DDIMER in the last 168 hours.   Radiology  ECHOCARDIOGRAM COMPLETE Result Date: 07/05/2024    ECHOCARDIOGRAM REPORT   Patient Name:   Angela Morales Date of Exam: 07/05/2024 Medical Rec #:  [id number]        Height:       63.0 in Accession #:    [id number]       Weight:       144.8 lb Date of Birth:  December 02, 1945       BSA:          1.686 m Patient Age:    79 years         BP:           110/59 mmHg Patient Gender: F                HR:           65 bpm. Exam Location:  Inpatient Procedure: 2D Echo, Cardiac Doppler, Color Doppler and Intracardiac            Opacification Agent (Both Spectral and Color Flow Doppler were            utilized during procedure). Indications:    Coronary artery disease;  History:        Patient has prior history of Echocardiogram examinations, most                 recent 06/06/2024. CAD,  Signs/Symptoms:Bacteremia, Shortness of                 Breath and Dyspnea; Risk Factors:Hypertension and Diabetes.                 Shock.  Sonographer:    Ellouise Mose RDCS Referring Phys: [id number] Jaecion Dempster D [facility]  Sonographer Comments: Post PCI left heart, right heart PCI planned. IMPRESSIONS  1. Left ventricular ejection fraction, by estimation, is 30 to 35%. Left ventricular ejection fraction by 2D MOD biplane is 32.1 %. Left ventricular ejection fraction by PLAX is 35 %. The left ventricle has moderately decreased function. The left ventricle demonstrates regional wall motion abnormalities (see scoring diagram/findings for description). The left ventricular internal cavity size was mildly dilated. There is moderate asymmetric left ventricular hypertrophy of the basal-septal segment.  Left ventricular diastolic parameters are consistent with Grade III diastolic dysfunction (restrictive).  2. Right ventricular systolic function is normal. The right ventricular size is normal. There is mildly elevated pulmonary artery systolic pressure. The estimated right ventricular systolic pressure is 42.6 mmHg.  3. The mitral valve is normal in structure. Mild mitral valve regurgitation. No evidence of mitral stenosis.  4. The aortic valve is tricuspid. There is mild calcification of the aortic valve. Aortic valve regurgitation is trivial. Aortic valve sclerosis is present, with no evidence of aortic valve stenosis.  5. The inferior vena cava is normal in size with <50% respiratory variability, suggesting right atrial pressure  of 8 mmHg. Conclusion(s)/Recommendation(s): No left ventricular mural or apical thrombus/thrombi. Reduced EF with focal wall motion abnormalities worst in the anteroseptal wall, but also in mid to distal anterior and inferoseptal wall consistent with LAD disease. Findings communicated to Manuelita Rummer, PA. FINDINGS  Left Ventricle: Left ventricular ejection fraction, by estimation, is 30 to 35%. Left  ventricular ejection fraction by PLAX is 35 %. Left ventricular ejection fraction by 2D MOD biplane is 32.1 %. The left ventricle has moderately decreased function. The left ventricle demonstrates regional wall motion abnormalities. Definity  contrast agent was given IV to delineate the left ventricular endocardial borders. The left ventricular internal cavity size was mildly dilated. There is moderate asymmetric left ventricular hypertrophy of the basal-septal segment. Left ventricular diastolic parameters are consistent with Grade III diastolic dysfunction (restrictive).  LV Wall Scoring: The anterior septum is akinetic. The mid and distal anterior wall, mid inferoseptal segment, apical septal segment, apical inferior segment, and apex are hypokinetic. The entire lateral wall, inferior wall, basal anterior segment, and basal inferoseptal segment are normal. Right Ventricle: The right ventricular size is normal. No increase in right ventricular wall thickness. Right ventricular systolic function is normal. There is mildly elevated pulmonary artery systolic pressure. The tricuspid regurgitant velocity is 2.94  m/s, and with an assumed right atrial pressure of 8 mmHg, the estimated right ventricular systolic pressure is 42.6 mmHg. Left Atrium: Left atrial size was normal in size. Right Atrium: Right atrial size was normal in size. Pericardium: There is no evidence of pericardial effusion. Mitral Valve: The mitral valve is normal in structure. Mild mitral valve regurgitation. No evidence of mitral valve stenosis. Tricuspid Valve: The tricuspid valve is normal in structure. Tricuspid valve regurgitation is mild . No evidence of tricuspid stenosis. Aortic Valve: The aortic valve is tricuspid. There is mild calcification of the aortic valve. Aortic valve regurgitation is trivial. Aortic valve sclerosis is present, with no evidence of aortic valve stenosis. Pulmonic Valve: The pulmonic valve was not well visualized.  Pulmonic valve regurgitation is not visualized. No evidence of pulmonic stenosis. Aorta: The aortic root, ascending aorta, aortic arch and descending aorta are all structurally normal, with no evidence of dilitation or obstruction. Venous: The right upper pulmonary vein is normal. The inferior vena cava is normal in size with less than 50% respiratory variability, suggesting right atrial pressure of 8 mmHg. IAS/Shunts: No atrial level shunt detected by color flow Doppler.  LEFT VENTRICLE PLAX 2D                        Biplane EF (MOD) LV EF:         Left            LV Biplane EF:   Left                ventricular                      ventricular                ejection                         ejection                fraction by                      fraction by  PLAX is 35                       2D MOD                %.                               biplane is LVIDd:         4.20 cm                          32.1 %. LVIDs:         3.50 cm LV PW:         0.60 cm         Diastology LV IVS:        1.50 cm         LV e' medial:    4.46 cm/s LVOT diam:     2.10 cm         LV E/e' medial:  26.9 LV SV:         56              LV e' lateral:   11.50 cm/s LV SV Index:   33              LV E/e' lateral: 10.4 LVOT Area:     3.46 cm  LV Volumes (MOD) LV vol d, MOD    69.9 ml A2C: LV vol d, MOD    54.0 ml A4C: LV vol s, MOD    48.7 ml A2C: LV vol s, MOD    34.3 ml A4C: LV SV MOD A2C:   21.2 ml LV SV MOD A4C:   54.0 ml LV SV MOD BP:    20.5 ml RIGHT VENTRICLE             IVC RV S prime:     13.60 cm/s  IVC diam: 2.00 cm TAPSE (M-mode): 1.8 cm                             PULMONARY VEINS                             Diastolic Velocity: 34.20 cm/s                             S/D Velocity:       1.10                             Systolic Velocity:  37.20 cm/s LEFT ATRIUM             Index        RIGHT ATRIUM           Index LA diam:        2.70 cm 1.60 cm/m   RA Area:     11.10 cm LA Vol (A2C):   29.3 ml 17.38 ml/m  RA  Volume:   24.70 ml  14.65 ml/m LA Vol (A4C):   43.1 ml 25.57 ml/m LA Biplane Vol: 35.9 ml 21.29 ml/m  AORTIC VALVE LVOT Vmax:   76.80 cm/s LVOT Vmean:  50.000 cm/s LVOT VTI:  0.163 m  AORTA Ao Root diam: 2.70 cm Ao Asc diam:  3.30 cm MITRAL VALVE                TRICUSPID VALVE MV Area (PHT): 5.27 cm     TR Peak grad:   34.6 mmHg MV Decel Time: 144 msec     TR Vmax:        294.00 cm/s MV E velocity: 120.00 cm/s MV A velocity: 29.00 cm/s   SHUNTS MV E/A ratio:  4.14         Systemic VTI:  0.16 m                             Systemic Diam: 2.10 cm Shelda Bruckner MD Electronically signed by Shelda Bruckner MD Signature Date/Time: 07/05/2024/3:02:08 PM    Final    CARDIAC CATHETERIZATION Result Date: 07/05/2024   Prox RCA lesion is 80% stenosed.   Mid LAD lesion is 100% stenosed.   A drug-eluting stent was successfully placed using a STENT SYNERGY XD 2.75X16.   Post intervention, there is a 0% residual stenosis. Acute anterior STEMI secondary to occlusion of the proximal to mid LAD Successful PTCA/DES x 1 proximal to mid LAD No obstructive disease noted in the Circumflex Large dominant RCA with severe mid stenosis (calcified) LVEDP=18 mmHg Recommendations: Will admit to the ICU. Aggrastat  drip for 6 hours since she was loaded with Plavix  (she takes Eliquis  at home). Continue DAPT with ASA and Plavix  for one month then stop ASA. Echo today. Will plan staged PCI of the RCA on Friday. Continue statin. Will try to push up dosing of her Crestor .    Patient Profile   79 y.o. female with PAF admitted with acute anterior STEMI early 07/05/24. Occluded LAD treated with a DES. Severe RCA stenosis will need staged PCI 07/06/24.   Assessment & Plan    CAD/Anterior STEMI: She is doing well today with no chest pain.  -Staged PCI of the mid RCA today.  -Continue DAPT with ASA/Plavix  for one month then stop ASA.  -Continue statin.   Ischemic cardiomyopathy/Acute systolic CHF: LVEF=35% with anterior wall motion  abnormality c/w LAD infarct. Will attempt to add GDMT prior to discharge but her BP has been soft.   -She has basilar crackles on exam. Will give one dose of IV Lasix  this am  PAF: Sinus today. Will plan to restart Eliquis  tomorrow.    Hypokalemia: Resolved.   For questions or updates, please contact [facility] Please consult [email or web address] for contact info under  Signed, Bruckner Cash, MD  07/06/2024, 8:00 AM    "

## 2024-07-06 NOTE — Progress Notes (Addendum)
 "  Rounding Note   Patient Name: Angela Morales Date of Encounter: 07/06/2024  [facility] Cardiologist: Jerel Balding, MD   Subjective  No chest pain  Scheduled Meds:  amiodarone   100 mg Oral Daily   aspirin   81 mg Oral Daily   Chlorhexidine  Gluconate Cloth  6 each Topical Daily   clopidogrel   75 mg Oral Q breakfast   gabapentin   300 mg Oral QHS   latanoprost   1 drop Left Eye QHS   levothyroxine   50 mcg Oral Q0600   metoprolol  succinate  25 mg Oral Daily   rosuvastatin   20 mg Oral Daily   sodium chloride  flush  3 mL Intravenous Q12H   timolol   1 drop Left Eye BID   Continuous Infusions:   PRN Meds: acetaminophen , morphine  injection, ondansetron  (ZOFRAN ) IV, mouth rinse, oxyCODONE , sodium chloride  flush   Vital Signs  Vitals:   07/06/24 0500 07/06/24 0600 07/06/24 0700 07/06/24 0752  BP: 107/68 112/63 (!) 87/57   Pulse: 87 71 67   Resp: 17 15    Temp:    98 F (36.7 C)  TempSrc:    Oral  SpO2: 95% 98% 98%   Weight:      Height:        Intake/Output Summary (Last 24 hours) at 07/06/2024 0800 Last data filed at 07/06/2024 0445 Gross per 24 hour  Intake 389.59 ml  Output 1150 ml  Net -760.41 ml      07/06/2024    4:00 AM 07/05/2024    2:40 AM 07/05/2024    1:00 AM  Last 3 Weights  Weight (lbs) 142 lb 3.2 oz 144 lb 13.5 oz 145 lb  Weight (kg) 64.5 kg 65.7 kg 65.772 kg      Telemetry  Sinus - Personally Reviewed  ECG   No am tracing  Physical Exam General: Well developed, well nourished, NAD  SKIN: warm, dry. Neuro: No focal deficits  Psychiatric: Mood and affect normal  Neck: No JVD Lungs:Clear bilaterally, no wheezes, rhonci, crackles Cardiovascular: Regular rate and rhythm. No murmurs, gallops or rubs. Abdomen:Soft.  Extremities: No lower extremity edema.    Labs High Sensitivity Troponin:  No results for input(s): TROPONINIHS in the last 720 hours.  Recent Labs  Lab 07/05/24 0111 07/05/24 0918  TRNPT 144* 8,138*        Chemistry Recent Labs  Lab 07/05/24 0111 07/05/24 0158 07/05/24 0918 07/06/24 0122  NA 135 126* 134* 134*  K 3.8 3.3* 4.4 4.1  CL 98 87* 98 100  CO2 26  --  27 24  GLUCOSE 145* 127* 102* 122*  BUN 10 10 9 10   CREATININE 0.74 0.60 0.60 0.57  CALCIUM  9.3  --  9.0 8.8*  PROT 7.8  --   --   --   ALBUMIN 4.2  --   --   --   AST 50*  --   --   --   ALT 16  --   --   --   ALKPHOS 63  --   --   --   BILITOT 0.5  --   --   --   GFRNONAA >60  --  >60 >60  ANIONGAP 11  --  8 9    Lipids  Recent Labs  Lab 07/05/24 0111  CHOL 144  TRIG 61  HDL 70  LDLCALC 62  CHOLHDL 2.1    Hematology Recent Labs  Lab 07/05/24 0111 07/05/24 0158 07/05/24 9081 07/06/24 0122  WBC 8.3  --  9.2 16.9*  RBC 4.38  --  4.15 4.15  HGB 14.8 14.3 14.0 14.0  HCT 44.9 42.0 41.2 41.8  MCV 102.5*  --  99.3 100.7*  MCH 33.8  --  33.7 33.7  MCHC 33.0  --  34.0 33.5  RDW 13.9  --  13.9 14.1  PLT 204  --  187 177   Thyroid  No results for input(s): TSH, FREET4 in the last 168 hours.  BNPNo results for input(s): BNP, PROBNP in the last 168 hours.  DDimer No results for input(s): DDIMER in the last 168 hours.   Radiology  ECHOCARDIOGRAM COMPLETE Result Date: 07/05/2024    ECHOCARDIOGRAM REPORT   Patient Name:   Angela Morales Date of Exam: 07/05/2024 Medical Rec #:  [id number]        Height:       63.0 in Accession #:    [id number]       Weight:       144.8 lb Date of Birth:  December 02, 1945       BSA:          1.686 m Patient Age:    79 years         BP:           110/59 mmHg Patient Gender: F                HR:           65 bpm. Exam Location:  Inpatient Procedure: 2D Echo, Cardiac Doppler, Color Doppler and Intracardiac            Opacification Agent (Both Spectral and Color Flow Doppler were            utilized during procedure). Indications:    Coronary artery disease;  History:        Patient has prior history of Echocardiogram examinations, most                 recent 06/06/2024. CAD,  Signs/Symptoms:Bacteremia, Shortness of                 Breath and Dyspnea; Risk Factors:Hypertension and Diabetes.                 Shock.  Sonographer:    Ellouise Mose RDCS Referring Phys: [id number] Jaecion Dempster D [facility]  Sonographer Comments: Post PCI left heart, right heart PCI planned. IMPRESSIONS  1. Left ventricular ejection fraction, by estimation, is 30 to 35%. Left ventricular ejection fraction by 2D MOD biplane is 32.1 %. Left ventricular ejection fraction by PLAX is 35 %. The left ventricle has moderately decreased function. The left ventricle demonstrates regional wall motion abnormalities (see scoring diagram/findings for description). The left ventricular internal cavity size was mildly dilated. There is moderate asymmetric left ventricular hypertrophy of the basal-septal segment.  Left ventricular diastolic parameters are consistent with Grade III diastolic dysfunction (restrictive).  2. Right ventricular systolic function is normal. The right ventricular size is normal. There is mildly elevated pulmonary artery systolic pressure. The estimated right ventricular systolic pressure is 42.6 mmHg.  3. The mitral valve is normal in structure. Mild mitral valve regurgitation. No evidence of mitral stenosis.  4. The aortic valve is tricuspid. There is mild calcification of the aortic valve. Aortic valve regurgitation is trivial. Aortic valve sclerosis is present, with no evidence of aortic valve stenosis.  5. The inferior vena cava is normal in size with <50% respiratory variability, suggesting right atrial pressure  of 8 mmHg. Conclusion(s)/Recommendation(s): No left ventricular mural or apical thrombus/thrombi. Reduced EF with focal wall motion abnormalities worst in the anteroseptal wall, but also in mid to distal anterior and inferoseptal wall consistent with LAD disease. Findings communicated to Manuelita Rummer, PA. FINDINGS  Left Ventricle: Left ventricular ejection fraction, by estimation, is 30 to 35%. Left  ventricular ejection fraction by PLAX is 35 %. Left ventricular ejection fraction by 2D MOD biplane is 32.1 %. The left ventricle has moderately decreased function. The left ventricle demonstrates regional wall motion abnormalities. Definity  contrast agent was given IV to delineate the left ventricular endocardial borders. The left ventricular internal cavity size was mildly dilated. There is moderate asymmetric left ventricular hypertrophy of the basal-septal segment. Left ventricular diastolic parameters are consistent with Grade III diastolic dysfunction (restrictive).  LV Wall Scoring: The anterior septum is akinetic. The mid and distal anterior wall, mid inferoseptal segment, apical septal segment, apical inferior segment, and apex are hypokinetic. The entire lateral wall, inferior wall, basal anterior segment, and basal inferoseptal segment are normal. Right Ventricle: The right ventricular size is normal. No increase in right ventricular wall thickness. Right ventricular systolic function is normal. There is mildly elevated pulmonary artery systolic pressure. The tricuspid regurgitant velocity is 2.94  m/s, and with an assumed right atrial pressure of 8 mmHg, the estimated right ventricular systolic pressure is 42.6 mmHg. Left Atrium: Left atrial size was normal in size. Right Atrium: Right atrial size was normal in size. Pericardium: There is no evidence of pericardial effusion. Mitral Valve: The mitral valve is normal in structure. Mild mitral valve regurgitation. No evidence of mitral valve stenosis. Tricuspid Valve: The tricuspid valve is normal in structure. Tricuspid valve regurgitation is mild . No evidence of tricuspid stenosis. Aortic Valve: The aortic valve is tricuspid. There is mild calcification of the aortic valve. Aortic valve regurgitation is trivial. Aortic valve sclerosis is present, with no evidence of aortic valve stenosis. Pulmonic Valve: The pulmonic valve was not well visualized.  Pulmonic valve regurgitation is not visualized. No evidence of pulmonic stenosis. Aorta: The aortic root, ascending aorta, aortic arch and descending aorta are all structurally normal, with no evidence of dilitation or obstruction. Venous: The right upper pulmonary vein is normal. The inferior vena cava is normal in size with less than 50% respiratory variability, suggesting right atrial pressure of 8 mmHg. IAS/Shunts: No atrial level shunt detected by color flow Doppler.  LEFT VENTRICLE PLAX 2D                        Biplane EF (MOD) LV EF:         Left            LV Biplane EF:   Left                ventricular                      ventricular                ejection                         ejection                fraction by                      fraction by  PLAX is 35                       2D MOD                %.                               biplane is LVIDd:         4.20 cm                          32.1 %. LVIDs:         3.50 cm LV PW:         0.60 cm         Diastology LV IVS:        1.50 cm         LV e' medial:    4.46 cm/s LVOT diam:     2.10 cm         LV E/e' medial:  26.9 LV SV:         56              LV e' lateral:   11.50 cm/s LV SV Index:   33              LV E/e' lateral: 10.4 LVOT Area:     3.46 cm  LV Volumes (MOD) LV vol d, MOD    69.9 ml A2C: LV vol d, MOD    54.0 ml A4C: LV vol s, MOD    48.7 ml A2C: LV vol s, MOD    34.3 ml A4C: LV SV MOD A2C:   21.2 ml LV SV MOD A4C:   54.0 ml LV SV MOD BP:    20.5 ml RIGHT VENTRICLE             IVC RV S prime:     13.60 cm/s  IVC diam: 2.00 cm TAPSE (M-mode): 1.8 cm                             PULMONARY VEINS                             Diastolic Velocity: 34.20 cm/s                             S/D Velocity:       1.10                             Systolic Velocity:  37.20 cm/s LEFT ATRIUM             Index        RIGHT ATRIUM           Index LA diam:        2.70 cm 1.60 cm/m   RA Area:     11.10 cm LA Vol (A2C):   29.3 ml 17.38 ml/m  RA  Volume:   24.70 ml  14.65 ml/m LA Vol (A4C):   43.1 ml 25.57 ml/m LA Biplane Vol: 35.9 ml 21.29 ml/m  AORTIC VALVE LVOT Vmax:   76.80 cm/s LVOT Vmean:  50.000 cm/s LVOT VTI:  0.163 m  AORTA Ao Root diam: 2.70 cm Ao Asc diam:  3.30 cm MITRAL VALVE                TRICUSPID VALVE MV Area (PHT): 5.27 cm     TR Peak grad:   34.6 mmHg MV Decel Time: 144 msec     TR Vmax:        294.00 cm/s MV E velocity: 120.00 cm/s MV A velocity: 29.00 cm/s   SHUNTS MV E/A ratio:  4.14         Systemic VTI:  0.16 m                             Systemic Diam: 2.10 cm Shelda Bruckner MD Electronically signed by Shelda Bruckner MD Signature Date/Time: 07/05/2024/3:02:08 PM    Final    CARDIAC CATHETERIZATION Result Date: 07/05/2024   Prox RCA lesion is 80% stenosed.   Mid LAD lesion is 100% stenosed.   A drug-eluting stent was successfully placed using a STENT SYNERGY XD 2.75X16.   Post intervention, there is a 0% residual stenosis. Acute anterior STEMI secondary to occlusion of the proximal to mid LAD Successful PTCA/DES x 1 proximal to mid LAD No obstructive disease noted in the Circumflex Large dominant RCA with severe mid stenosis (calcified) LVEDP=18 mmHg Recommendations: Will admit to the ICU. Aggrastat  drip for 6 hours since she was loaded with Plavix  (she takes Eliquis  at home). Continue DAPT with ASA and Plavix  for one month then stop ASA. Echo today. Will plan staged PCI of the RCA on Friday. Continue statin. Will try to push up dosing of her Crestor .    Patient Profile   79 y.o. female with PAF admitted with acute anterior STEMI early 07/05/24. Occluded LAD treated with a DES. Severe RCA stenosis will need staged PCI 07/06/24.   Assessment & Plan    CAD/Anterior STEMI: She is doing well today with no chest pain.  -Staged PCI of the mid RCA today.  -Continue DAPT with ASA/Plavix  for one month then stop ASA.  -Continue statin.   Ischemic cardiomyopathy/Acute systolic CHF: LVEF=35% with anterior wall motion  abnormality c/w LAD infarct. Will attempt to add GDMT prior to discharge but her BP has been soft.   -She has basilar crackles on exam. Will give one dose of IV Lasix  this am  PAF: Sinus today. Will plan to restart Eliquis  tomorrow.    Hypokalemia: Resolved.   For questions or updates, please contact [facility] Please consult [email or web address] for contact info under  Signed, Bruckner Cash, MD  07/06/2024, 8:00 AM    "

## 2024-07-06 NOTE — Progress Notes (Signed)
 Heart Failure Nurse Navigator Progress Note  PCP: Clarice Nottingham, MD PCP-Cardiologist: Croitoru Admission Diagnosis: STEMI  Admitted from: Home via [facility]  Presentation:   Angela Morales presented with mi sternal chest pain and back pain, that radiated to her left shoulder and neck.EKG performed at the bedside demonstrating elevations in leads V3 and V4 with septal Q waves concerning for a STEMI.  BP 141/79, HR 70, went to cath lab emergently , found to have an acute occlusion of the LAD requiring 1 drug-eluting stent. Planned staged PCI of the RCA Friday.   Patient and her son were educated on the sign and symptoms of Heart Failure, daily weights, when to call her doctor or go to the ED. Diet/ fluid restrictions. Patient reported to using salt with her cooking, and does drink less then 64 oz per day. Continued education on taking all medications as prescribed and attending all medical appointments. Patient and her son verbalized their understanding of the education provided. A HF TOC appointment was scheduled for 07/11/2024 @ 2:45 pm.     ECHO/ LVEF: 30-35%   Clinical Course:  Past Medical History:  Diagnosis Date   Abnormal uterine bleeding    on HRT   Allergy    seasonal   Arthritis    Atrial fibrillation (HCC)    Celiac disease    DDD (degenerative disc disease)    Dysrhythmia    GBS (Guillain-Barre syndrome)    Glaucoma    Microscopic colitis    Osteoarthritis of both knees    PONV (postoperative nausea and vomiting)    SVT (supraventricular tachycardia)      Social History   Socioeconomic History   Marital status: Married    Spouse name: [name]   Number of children: 3   Years of education: Not on file   Highest education level: Not on file  Occupational History   Occupation: retired  Tobacco Use   Smoking status: Never   Smokeless tobacco: Never   Tobacco comments:    Never smoked 02/10/23  Vaping Use   Vaping status: Never Used  Substance and Sexual Activity    Alcohol  use: Yes    Alcohol /week: 7.0 standard drinks of alcohol     Types: 7 Glasses of wine per week    Comment: 1 glass of wine with dinner 02/10/23   Drug use: No   Sexual activity: Yes    Partners: Male    Birth control/protection: Surgical    Comment: TVH  Other Topics Concern   Not on file  Social History Narrative   Lives with husband and son   Left handed   Caffeine: 1 cup of coffee a day   Social Drivers of Health   Tobacco Use: Low Risk (12/15/2023)   Patient History    Smoking Tobacco Use: Never    Smokeless Tobacco Use: Never    Passive Exposure: Not on file  Financial Resource Strain: Not on file  Food Insecurity: Not on file  Transportation Needs: Not on file  Physical Activity: Not on file  Stress: Not on file  Social Connections: Not on file  Depression (PHQ2-9): Low Risk (08/27/2022)   Depression (PHQ2-9)    PHQ-2 Score: 0  Alcohol  Screen: Not on file  Housing: Not on file  Utilities: Not on file  Health Literacy: Not on file   Education Assessment and Provision:  Detailed education and instructions provided on heart failure disease management including the following:  Signs and symptoms of Heart Failure When to  call the physician Importance of daily weights Low sodium diet Fluid restriction Medication management Anticipated future follow-up appointments  Patient education given on each of the above topics.  Patient acknowledges understanding via teach back method and acceptance of all instructions.  Education Materials:  Living Better With Heart Failure Booklet, HF zone tool, & Daily Weight Tracker Tool.  Patient has scale at home: Yes Patient has pill box at home: Yes    High Risk Criteria for Readmission and/or Poor Patient Outcomes: Heart failure hospital admissions (last 6 months): 0  No Show rate: 0 Difficult social situation: No, lives with her husband and son.  Demonstrates medication adherence: yes Primary Language: English   Literacy level: Reading, writing, and comprehension.   Barriers of Care:   Diet/ fluid restrictions ( uses salt in her cooking)  Daily weights.   Considerations/Referrals:   Referral made to Heart Failure Pharmacist Stewardship: Yes Referral made to Heart Failure CSW/NCM TOC: NA Referral made to Heart & Vascular TOC clinic: Yes, 07/11/2024 @ 2:45 pm.   Items for Follow-up on DC/TOC: Continued HF education  Diet/ fluid restrictions/ daily weights   Stephane Haddock, BSN, RN Heart Failure Teacher, Adult Education Only

## 2024-07-07 ENCOUNTER — Other Ambulatory Visit (HOSPITAL_COMMUNITY): Payer: Self-pay

## 2024-07-07 ENCOUNTER — Encounter (HOSPITAL_COMMUNITY): Payer: Self-pay | Admitting: Cardiovascular Disease

## 2024-07-07 DIAGNOSIS — I5020 Unspecified systolic (congestive) heart failure: Secondary | ICD-10-CM | POA: Insufficient documentation

## 2024-07-07 LAB — BASIC METABOLIC PANEL WITH GFR
Anion gap: 9 (ref 5–15)
BUN: 12 mg/dL (ref 8–23)
CO2: 24 mmol/L (ref 22–32)
Calcium: 8.3 mg/dL — ABNORMAL LOW (ref 8.9–10.3)
Chloride: 100 mmol/L (ref 98–111)
Creatinine, Ser: 0.54 mg/dL (ref 0.44–1.00)
GFR, Estimated: >60 mL/min
Glucose, Bld: 75 mg/dL (ref 70–99)
Potassium: 3.6 mmol/L (ref 3.5–5.1)
Sodium: 133 mmol/L — ABNORMAL LOW (ref 135–145)

## 2024-07-07 LAB — CBC
HCT: 36.1 % (ref 36.0–46.0)
Hemoglobin: 12.2 g/dL (ref 12.0–15.0)
MCH: 34 pg (ref 26.0–34.0)
MCHC: 33.8 g/dL (ref 30.0–36.0)
MCV: 100.6 fL — ABNORMAL HIGH (ref 80.0–100.0)
Platelets: 152 K/uL (ref 150–400)
RBC: 3.59 MIL/uL — ABNORMAL LOW (ref 3.87–5.11)
RDW: 13.9 % (ref 11.5–15.5)
WBC: 8.5 K/uL (ref 4.0–10.5)
nRBC: 0 % (ref 0.0–0.2)

## 2024-07-07 MED ORDER — ROSUVASTATIN CALCIUM 20 MG PO TABS
20.0000 mg | ORAL_TABLET | Freq: Every day | ORAL | 1 refills | Status: AC
  Filled 2024-07-07 (×2): qty 90, 90d supply, fill #0

## 2024-07-07 MED ORDER — METOPROLOL SUCCINATE ER 25 MG PO TB24
12.5000 mg | ORAL_TABLET | Freq: Every day | ORAL | Status: DC
  Administered 2024-07-07: 12.5 mg via ORAL
  Filled 2024-07-07: qty 1

## 2024-07-07 MED ORDER — CLOPIDOGREL BISULFATE 75 MG PO TABS: 75.0000 mg | ORAL_TABLET | Freq: Every day | ORAL | 1 refills | Status: DC

## 2024-07-07 MED ORDER — PANTOPRAZOLE SODIUM 40 MG PO TBEC
40.0000 mg | DELAYED_RELEASE_TABLET | Freq: Every day | ORAL | 1 refills | Status: AC
  Filled 2024-07-07: qty 30, 30d supply, fill #0

## 2024-07-07 MED ORDER — CLOPIDOGREL BISULFATE 75 MG PO TABS
75.0000 mg | ORAL_TABLET | Freq: Every day | ORAL | 1 refills | Status: AC
  Filled 2024-07-07 (×2): qty 90, 90d supply, fill #0

## 2024-07-07 MED ORDER — ROSUVASTATIN CALCIUM 20 MG PO TABS: 20.0000 mg | ORAL_TABLET | Freq: Every day | ORAL | 1 refills | Status: DC

## 2024-07-07 MED ORDER — EZETIMIBE 10 MG PO TABS: 10.0000 mg | ORAL_TABLET | Freq: Every day | ORAL | 1 refills | Status: DC

## 2024-07-07 MED ORDER — LOSARTAN POTASSIUM 25 MG PO TABS: 12.5000 mg | ORAL_TABLET | Freq: Every day | ORAL | 1 refills | Status: DC

## 2024-07-07 MED ORDER — EZETIMIBE 10 MG PO TABS
10.0000 mg | ORAL_TABLET | Freq: Every day | ORAL | Status: DC
  Administered 2024-07-07: 10 mg via ORAL
  Filled 2024-07-07: qty 1

## 2024-07-07 MED ORDER — ASPIRIN 81 MG PO CHEW: 81.0000 mg | CHEWABLE_TABLET | Freq: Every day | ORAL | 0 refills | Status: DC

## 2024-07-07 MED ORDER — METOPROLOL SUCCINATE ER 25 MG PO TB24
12.5000 mg | ORAL_TABLET | Freq: Every evening | ORAL | 1 refills | Status: DC
  Filled 2024-07-07 (×2): qty 45, 90d supply, fill #0

## 2024-07-07 MED ORDER — LOSARTAN POTASSIUM 25 MG PO TABS
12.5000 mg | ORAL_TABLET | Freq: Every day | ORAL | Status: DC
  Administered 2024-07-07: 12.5 mg via ORAL
  Filled 2024-07-07: qty 1

## 2024-07-07 MED ORDER — ASPIRIN 81 MG PO TBEC
81.0000 mg | DELAYED_RELEASE_TABLET | Freq: Every day | ORAL | 0 refills | Status: AC
  Filled 2024-07-07: qty 30, 30d supply, fill #0

## 2024-07-07 MED ORDER — METOPROLOL SUCCINATE ER 25 MG PO TB24: 12.5000 mg | ORAL_TABLET | Freq: Every day | ORAL | 1 refills | Status: DC

## 2024-07-07 MED ORDER — LOSARTAN POTASSIUM 25 MG PO TABS
12.5000 mg | ORAL_TABLET | Freq: Every day | ORAL | 1 refills | Status: AC
  Filled 2024-07-07: qty 45, 90d supply, fill #0

## 2024-07-07 NOTE — Discharge Summary (Cosign Needed Addendum)
 " Discharge Summary   Patient ID: Angela Morales MRN: [id number]; DOB: 22-Apr-1946  Admit date: 07/05/2024 Discharge date: 07/07/2024  PCP:  Clarice Nottingham, MD   [facility] Providers Cardiologist:  Jerel Balding, MD  Electrophysiologist:  Soyla Gladis Norton, MD      Discharge Diagnoses  Principal Problem:   Acute ST elevation myocardial infarction (STEMI) due to occlusion of left anterior descending (LAD) coronary artery [facility]) Active Problems:   Paroxysmal atrial fibrillation (HCC)   Chronic anticoagulation   HFrEF (heart failure with reduced ejection fraction) [facility])   Diagnostic Studies/Procedures   Cardiac Catheterization: 07/05/2024   Prox RCA lesion is 80% stenosed.   Mid LAD lesion is 100% stenosed.   A drug-eluting stent was successfully placed using a STENT SYNERGY XD 2.75X16.   Post intervention, there is a 0% residual stenosis.   Acute anterior STEMI secondary to occlusion of the proximal to mid LAD Successful PTCA/DES x 1 proximal to mid LAD No obstructive disease noted in the Circumflex Large dominant RCA with severe mid stenosis (calcified) LVEDP=18 mmHg   Recommendations: Will admit to the ICU. Aggrastat  drip for 6 hours since she was loaded with Plavix  (she takes Eliquis  at home). Continue DAPT with ASA and Plavix  for one month then stop ASA. Echo today. Will plan staged PCI of the RCA on Friday. Continue statin. Will try to push up dosing of her Crestor .   Echocardiogram: 07/05/2024 IMPRESSIONS     1. Left ventricular ejection fraction, by estimation, is 30 to 35%. Left  ventricular ejection fraction by 2D MOD biplane is 32.1 %. Left  ventricular ejection fraction by PLAX is 35 %. The left ventricle has  moderately decreased function. The left  ventricle demonstrates regional wall motion abnormalities (see scoring  diagram/findings for description). The left ventricular internal cavity  size was mildly dilated. There is moderate asymmetric left  ventricular  hypertrophy of the basal-septal segment.   Left ventricular diastolic parameters are consistent with Grade III  diastolic dysfunction (restrictive).   2. Right ventricular systolic function is normal. The right ventricular  size is normal. There is mildly elevated pulmonary artery systolic  pressure. The estimated right ventricular systolic pressure is 42.6 mmHg.   3. The mitral valve is normal in structure. Mild mitral valve  regurgitation. No evidence of mitral stenosis.   4. The aortic valve is tricuspid. There is mild calcification of the  aortic valve. Aortic valve regurgitation is trivial. Aortic valve  sclerosis is present, with no evidence of aortic valve stenosis.   5. The inferior vena cava is normal in size with <50% respiratory  variability, suggesting right atrial pressure of 8 mmHg.   Conclusion(s)/Recommendation(s): No left ventricular mural or apical  thrombus/thrombi. Reduced EF with focal wall motion abnormalities worst in  the anteroseptal wall, but also in mid to distal anterior and inferoseptal  wall consistent with LAD disease.  Findings communicated to Manuelita Rummer, PA.    Coronary Stent Intervention: 07/06/2024 Successful PCI of severe stenosis in the RCA using a 3.0 x 16 mm Synergy DES   Recommendations: Aspirin  81 mg daily x 1 month, clopidogrel  75 mg daily times at least 6 months, resume apixaban  tomorrow morning.   History of Present Illness   Angela Morales is a 79 y.o. female with past medial history of Guillain-Barr syndrome, paroxysmal atrial fibrillation and celiac disease who presented to [facility] ED on 07/05/2024 for evaluation of chest pain.   She reported the evening prior to arrival,  she had consumed dinner and developed severe abdominal discomfort which she initially thought was due to acid reflux as she had nausea and vomiting as well. Symptoms persisted which prompted her to come to the ED for further evaluation and initial  EKG showed ST elevation along V3 and V4 which was concerning for a STEMI and urgent cardiac catheterization was recommended.  Hospital Course   Consultants: None   Catheterization showed an occluded proximal to mid LAD which was treated with PTCA/DES x 1.  She did have 80% proximal RCA stenosis as well. She was loaded with Plavix  with recommendations to continue DAPT with ASA and Plavix  for 1 month and then stop ASA since she was on Eliquis  prior to admission. Was recommended to plan for staged PCI of the RCA on 07/06/2024.  Echocardiogram imaging did show her EF was reduced at 30 to 35% with wall motion abnormalities noted. She did have normal RV function and mildly elevated PASP at 42.6 mmHg. She did undergo staged PCI on 07/06/2024 with DES x 1 to the RCA and was again recommended to continue ASA for 1 month and Plavix  for at least 6 months while resuming Eliquis  the following morning.  On 07/07/2024, she reported overall feeling well and denied any anginal symptoms. Had ambulated around the unit. Was evaluated by Dr. Rolan and deemed stable for discharge. In regard to GDMT, this was overall limited given her soft BP with SBP in the 90's to low 100's. Was started on Toprol -XL 12.5 mg daily and Losartan  12.5 mg daily and recommended to adjust GDMT gradually as an outpatient. A Heart Failure TOC appointment has been arranged for 07/11/2024 along with follow-up with General Cardiology.     Did the patient have an acute coronary syndrome (MI, NSTEMI, STEMI, etc) this admission?:  Yes                               AHA/ACC ACS Clinical Performance & Quality Measures: Aspirin  prescribed? - Yes ADP Receptor Inhibitor (Plavix /Clopidogrel , Brilinta/Ticagrelor or Effient/Prasugrel) prescribed (includes medically managed patients)? - Yes Beta Blocker prescribed? - Yes High Intensity Statin (Lipitor 40-80mg  or Crestor  20-40mg ) prescribed? - Yes EF assessed during THIS hospitalization? - Yes For EF <40%, was  ACEI/ARB prescribed? - Yes For EF <40%, Aldosterone Antagonist (Spironolactone or Eplerenone) prescribed? - No - Reason:  Hypotension Cardiac Rehab Phase II ordered (including medically managed patients)? - Yes    _____________  Discharge Vitals Blood pressure 118/67, pulse 81, temperature (!) 97.1 F (36.2 C), temperature source Axillary, resp. rate 18, height 5' 3 (1.6 m), weight 64.5 kg, last menstrual period 04/20/2005, SpO2 96%.  Filed Weights   07/05/24 0240 07/06/24 0400 07/07/24 0541  Weight: 65.7 kg 64.5 kg 64.5 kg    Labs & Radiologic Studies  CBC Recent Labs    07/05/24 0111 07/05/24 0158 07/06/24 0122 07/07/24 0150  WBC 8.3   < > 16.9* 8.5  NEUTROABS 6.4  --   --   --   HGB 14.8   < > 14.0 12.2  HCT 44.9   < > 41.8 36.1  MCV 102.5*   < > 100.7* 100.6*  PLT 204   < > 177 152   < > = values in this interval not displayed.   Basic Metabolic Panel Recent Labs    98/90/73 0122 07/07/24 0150  NA 134* 133*  K 4.1 3.6  CL 100 100  CO2 24 24  GLUCOSE 122* 75  BUN 10 12  CREATININE 0.57 0.54  CALCIUM  8.8* 8.3*   Liver Function Tests Recent Labs    07/05/24 0111  AST 50*  ALT 16  ALKPHOS 63  BILITOT 0.5  PROT 7.8  ALBUMIN 4.2   No results for input(s): LIPASE, AMYLASE in the last 72 hours. High Sensitivity Troponin:   No results for input(s): TROPONINIHS in the last 720 hours.  Recent Labs  Lab 07/05/24 0111 07/05/24 0918  TRNPT 144* 8,138*    BNP Invalid input(s): POCBNP No results for input(s): PROBNP in the last 72 hours.  No results for input(s): BNP in the last 72 hours.  D-Dimer No results for input(s): DDIMER in the last 72 hours. Hemoglobin A1C Recent Labs    07/05/24 0111  HGBA1C 5.0   Fasting Lipid Panel Recent Labs    07/05/24 0111  CHOL 144  HDL 70  LDLCALC 62  TRIG 61  CHOLHDL 2.1   Lipoprotein (a)  Date/Time Value Ref Range Status  07/05/2024 09:18 AM 9.2 <75.0 nmol/L Final    Comment:     (NOTE) This test was developed and its performance characteristics determined by [facility]. It has not been cleared or approved by the Food and Drug Administration. Note:  Values greater than or equal to 75.0 nmol/L may       indicate an independent risk factor for CHD,       but must be evaluated with caution when applied       to non-Caucasian populations due to the       influence of genetic factors on Lp(a) across       ethnicities. Performed At: [facility] [address] [place], [place] [id number] Jennette Shorter MD Ey:[phone]     Thyroid  Function Tests No results for input(s): TSH, T4TOTAL, T3FREE, THYROIDAB in the last 72 hours.  Invalid input(s): FREET3 _____________   Disposition Pt is being discharged home today in good condition.  Follow-up Plans & Appointments  Follow-up Information     [facility]. Go in 2 day(s).   Specialty: Cardiology Why: Hospital follow up 07/11/2024 @ 2:45 pm PLEASE bring a current medication list to appointment FREE valet parking, Entrance C, off [address] [facility] [address] and [facility] entrance Contact information: [address] [place]  [address] [phone]        Devora Rosabel BIRCH, NP Follow up on 07/23/2024.   Specialty: Cardiology Why: Cardiology Follow-up on 07/23/2024 at 1:30 PM. Contact information: [address] [place] [address] [phone]                Discharge Instructions     AMB Referral to Cardiac Rehabilitation - Phase II   Complete by: As directed    Diagnosis: STEMI   After initial evaluation and assessments completed: Virtual Based Care may be provided alone or in conjunction with Phase 2 Cardiac Rehab based on patient barriers.: Yes   Intensive Cardiac Rehabilitation (ICR) [facility] location only OR Traditional Cardiac Rehabilitation (TCR) *If criteria for ICR are not met will enroll in TCR  ([facility] only): Yes   Amb Referral to Cardiac Rehabilitation   Complete by: As directed    Diagnosis:  STEMI Coronary Stents     After initial evaluation and assessments completed: Virtual Based Care may be provided alone or in conjunction with Phase 2 Cardiac Rehab based on patient barriers.: Yes   Intensive Cardiac Rehabilitation (ICR) [facility] location  only OR Traditional Cardiac Rehabilitation (TCR) *If criteria for ICR are not met will enroll in TCR [facility] only): Yes   Discharge instructions   Complete by: As directed    Radial Site Care Refer to this sheet in the next few weeks. These instructions provide you with information on caring for yourself after your procedure. Your caregiver may also give you more specific instructions. Your treatment has been planned according to current medical practices, but problems sometimes occur. Call your caregiver if you have any problems or questions after your procedure. HOME CARE INSTRUCTIONS You may shower the day after the procedure. Remove the bandage (dressing) and gently wash the site with plain soap and water . Gently pat the site dry.  Do not apply powder or lotion to the site.  Do not submerge the affected site in water  for 3 to 5 days.  Inspect the site at least twice daily.  Do not flex or bend the affected arm for 24 hours.  No lifting over 5 pounds (2.3 kg) for 5 days after your procedure.  Do not drive home if you are discharged the same day of the procedure. Have someone else drive you.  You may drive 24 hours after the procedure unless otherwise instructed by your caregiver.  What to expect: Any bruising will usually fade within 1 to 2 weeks.  Blood that collects in the tissue (hematoma) may be painful to the touch. It should usually decrease in size and tenderness within 1 to 2 weeks.  SEEK IMMEDIATE MEDICAL CARE IF: You have unusual pain at the radial site.  You have redness, warmth, swelling, or pain at the radial site.  You have drainage  (other than a small amount of blood on the dressing).  You have chills.  You have a fever or persistent symptoms for more than 72 hours.  You have a fever and your symptoms suddenly get worse.  Your arm becomes pale, cool, tingly, or numb.  You have heavy bleeding from the site. Hold pressure on the site.       Discharge Medications Allergies as of 07/07/2024       Reactions   Gluten Meal Other (See Comments)   celiac disease   Lactose Intolerance (gi) Other (See Comments)   Gas and bloating   Cefdinir Rash   Robaxin  [methocarbamol ] Other (See Comments)   Dizziness         Medication List     TAKE these medications    amiodarone  200 MG tablet Commonly known as: PACERONE  Take 0.5 tablets (100 mg total) by mouth every Monday, Tuesday, Wednesday, Thursday, and Friday.   aspirin  EC 81 MG tablet Take 1 tablet (81 mg total) by mouth daily. Swallow whole.  STOP taking aspirin  after your last dose on 08/07/2024   Cholecalciferol 50 MCG (2000 UT) Caps Take 2,000 Units by mouth daily. Notes to patient: Next Dose 07/08/24   clopidogrel  75 MG tablet Commonly known as: PLAVIX  Take 1 tablet (75 mg total) by mouth daily with breakfast. Start taking on: July 08, 2024   [employer] MAGNESIUM  GLYCINATE PO Take 1 capsule by mouth at bedtime. Notes to patient: Next Dose 07/07/24   Eliquis  5 MG Tabs tablet Generic drug: apixaban  TAKE 1 TABLET BY MOUTH TWICE A DAY Notes to patient: Next Dose: 07/07/24   ezetimibe  10 MG tablet Commonly known as: ZETIA  Take 1 tablet (10 mg total) by mouth daily. Start taking on: July 08, 2024   gabapentin  300 MG capsule Commonly known  as: NEURONTIN  TAKE 1 CAPSULE BY MOUTH EVERYDAY AT BEDTIME Notes to patient: Next Dose 07/07/24   levothyroxine  50 MCG tablet Commonly known as: SYNTHROID  Take 50 mcg by mouth daily before breakfast. Notes to patient: Next Dose 07/08/24   losartan  25 MG tablet Commonly known as: COZAAR  Take 0.5 tablets (12.5 mg  total) by mouth daily. Start taking on: July 08, 2024   metoprolol  succinate 25 MG 24 hr tablet Commonly known as: Toprol  XL Take 0.5 tablets (12.5 mg total) by mouth at bedtime. What changed:  how much to take when to take this   pantoprazole  40 MG tablet Commonly known as: Protonix  Take 1 tablet (40 mg total) by mouth daily.   polyethylene glycol 17 g packet Commonly known as: MIRALAX  / GLYCOLAX  Take 17 g by mouth daily as needed.   rosuvastatin  20 MG tablet Commonly known as: CRESTOR  Take 1 tablet (20 mg total) by mouth daily. What changed:  medication strength how much to take when to take this   Systane Complete 0.6 % Soln Generic drug: Propylene Glycol Place 1 drop into both eyes as needed (dry eyes).   timolol  0.5 % ophthalmic solution Commonly known as: TIMOPTIC  Place 1 drop into the left eye 2 (two) times daily. Notes to patient: Next Dose 07/07/24   Zioptan  0.0015 % Soln Generic drug: Tafluprost  (PF) Place 1 drop into the left eye at bedtime. Notes to patient: Next Dose 07/07/24         Outstanding Labs/Studies  BMET at Follow-up  Duration of Discharge Encounter: APP Time: 26 minutes   Signed, Laymon CHRISTELLA Qua, PA-C 07/07/2024, 12:09 PM  Patient seen with PA, I formulated the plan and agree with the above note.   Please see my separate note from today   31 minutes physician discharge time.   Ezra Shuck 07/08/2024      "

## 2024-07-07 NOTE — Progress Notes (Signed)
 Patient ID: Angela Morales, female   DOB: 10-Jan-1946, 79 y.o.   MRN: [id number]     Advanced Heart Failure Rounding Note  Cardiologist: Jerel Balding, MD Chief Complaint: Anterior STEMI     No complaints this morning, no dyspnea or chest pain.  She has walked around the unit.    Objective:   Weight Range: 64.5 kg Body mass index is 25.19 kg/m.   Vital Signs:   Temp:  [97.2 F (36.2 C)-99.7 F (37.6 C)] 97.2 F (36.2 C) (01/10 0700) Pulse Rate:  [0-86] 70 (01/10 0900) Resp:  [8-36] 12 (01/10 0900) BP: (79-118)/(37-89) 92/63 (01/10 0900) SpO2:  [84 %-99 %] 96 % (01/10 0900) Weight:  [64.5 kg] 64.5 kg (01/10 0541) Last BM Date : 07/04/24  Weight change: Filed Weights   07/05/24 0240 07/06/24 0400 07/07/24 0541  Weight: 65.7 kg 64.5 kg 64.5 kg    Intake/Output:   Intake/Output Summary (Last 24 hours) at 07/07/2024 0929 Last data filed at 07/07/2024 0800 Gross per 24 hour  Intake 1220 ml  Output 300 ml  Net 920 ml     Physical Exam   General: NAD Neck: No JVD, no thyromegaly or thyroid  nodule.  Lungs: Clear to auscultation bilaterally with normal respiratory effort. CV: Nondisplaced PMI.  Heart regular S1/S2, no S3/S4, no murmur.  No peripheral edema.   Abdomen: Soft, nontender, no hepatosplenomegaly, no distention.  Skin: Intact without lesions or rashes.  Neurologic: Alert and oriented x 3.  Psych: Normal affect. Extremities: No clubbing or cyanosis.  HEENT: Normal.   Telemetry   NSR (personally reviewed)  Labs   CBC Recent Labs    07/05/24 0111 07/05/24 0158 07/06/24 0122 07/07/24 0150  WBC 8.3   < > 16.9* 8.5  NEUTROABS 6.4  --   --   --   HGB 14.8   < > 14.0 12.2  HCT 44.9   < > 41.8 36.1  MCV 102.5*   < > 100.7* 100.6*  PLT 204   < > 177 152   < > = values in this interval not displayed.   Basic Metabolic Panel Recent Labs    98/90/73 0122 07/07/24 0150  NA 134* 133*  K 4.1 3.6  CL 100 100  CO2 24 24  GLUCOSE 122* 75  BUN 10  12  CREATININE 0.57 0.54  CALCIUM  8.8* 8.3*   Liver Function Tests Recent Labs    07/05/24 0111  AST 50*  ALT 16  ALKPHOS 63  BILITOT 0.5  PROT 7.8  ALBUMIN 4.2   No results for input(s): LIPASE, AMYLASE in the last 72 hours. Cardiac Enzymes No results for input(s): CKTOTAL, CKMB, CKMBINDEX, TROPONINI in the last 72 hours.  BNP: BNP (last 3 results) No results for input(s): BNP in the last 8760 hours.  ProBNP (last 3 results) No results for input(s): PROBNP in the last 8760 hours.   D-Dimer No results for input(s): DDIMER in the last 72 hours. Hemoglobin A1C Recent Labs    07/05/24 0111  HGBA1C 5.0   Fasting Lipid Panel Recent Labs    07/05/24 0111  CHOL 144  HDL 70  LDLCALC 62  TRIG 61  CHOLHDL 2.1   Medications:   Scheduled Medications:  amiodarone   100 mg Oral Daily   apixaban   5 mg Oral BID   aspirin   81 mg Oral Daily   Chlorhexidine  Gluconate Cloth  6 each Topical Daily   clopidogrel   75 mg Oral Q breakfast   ezetimibe   10 mg Oral Daily   gabapentin   300 mg Oral QHS   latanoprost   1 drop Left Eye QHS   levothyroxine   50 mcg Oral Q0600   losartan   12.5 mg Oral Daily   metoprolol  succinate  12.5 mg Oral Daily   rosuvastatin   20 mg Oral Daily   sodium chloride  flush  3 mL Intravenous Q12H   sodium chloride  flush  3 mL Intravenous Q12H   timolol   1 drop Left Eye BID    Infusions:  sodium chloride       PRN Medications: sodium chloride , acetaminophen , morphine  injection, ondansetron  (ZOFRAN ) IV, mouth rinse, oxyCODONE , sodium chloride  flush, sodium chloride  flush  Assessment/Plan   1. CAD: Patient presented with anterior STEMI, LAD occluded and 80% mRCA stenosis.  Initial DES to LAD, had staged DES to RCA yesterday.  No chest pain.  - Continue ASA 81 + Plavix  x 1 month, will stop ASA at 1 month as she will be on apixaban .  - Continue Crestor  + Zetia  10 daily.  2. Ischemic cardiomyopathy: Echo this admission with EF 30-35%  post-MI.  No arrhythmias.  She is not volume overloaded on exam.  SBP 90s-100s.  - Toprol  XL 12.5 mg daily.  - Start losartan  12.5 mg daily.  - Slow GDMT titration as outpatient given soft BP.  - She does not appear to need a diuretic.  3. Atrial fibrillation: Paroxysmal.  In NSR here.  - Continue po amiodarone .  - Apixaban  restarted this morning.   She can go today.  Followup with Dr. Francyne.  Cardiac meds for home: amiodarone  100 daily, apixaban  5 mg bid, ASA 81 daily x 1 month then stop, Plavix  75 daily, Crestor  20 daily, Zetia  10 daily, losartan  12.5 daily, Toprol  XL 12.5 daily.   Length of Stay: 2  Ezra Shuck, MD  07/07/2024, 9:29 AM  Advanced Heart Failure Team Pager [phone] (M-F; 7a - 5p)   Please visit [email or web address]: For overnight coverage please call cardiology fellow first. If fellow not available call Shock/ECMO MD on call.  For ECMO / Mechanical Support (Impella, IABP, LVAD) issues call Shock / ECMO MD on call.

## 2024-07-07 NOTE — Discharge Instructions (Addendum)

## 2024-07-07 NOTE — Progress Notes (Addendum)
 Pt education completed to include future appointments, current prescriptions and medications, and doctors discharge instructions. Pt alert and oriented, vital signs stable. Notified of prescriptions being ready at [facility] pharmacy @1300 . Medications picked up on the way out of hospital. Pt discharged with nursing staff. Temp: 97.1 F (36.2 C) (01/10 1120) Temp Source: Axillary (01/10 1120) BP: 98/80 (01/10 1200) Pulse Rate: 70 (01/10 1200)  Lauraine FORBES Ryder 07/07/2024 1:34 PM

## 2024-07-07 NOTE — Plan of Care (Signed)
" °  Problem: Education: Goal: Knowledge of General Education information will improve Description: Including pain rating scale, medication(s)/side effects and non-pharmacologic comfort measures Outcome: Progressing   Problem: Pain Managment: Goal: General experience of comfort will improve and/or be controlled Outcome: Progressing   Problem: Skin Integrity: Goal: Risk for impaired skin integrity will decrease Outcome: Progressing   Problem: Activity: Goal: Ability to return to baseline activity level will improve Outcome: Progressing   Problem: Health Behavior/Discharge Planning: Goal: Ability to safely manage health-related needs after discharge will improve Outcome: Progressing   "

## 2024-07-07 NOTE — Progress Notes (Signed)
 CARDIAC REHAB PHASE I   PRE:  Rate/Rhythm: 71 SR    BP: sitting 113/67    SpO2: 97 RA  MODE:  Ambulation: 270 ft   POST:  Rate/Rhythm: 92 SR    BP: sitting 118/67     SpO2: 97 RA   Pt ambulated with BR then in hall with RW, contact guard. Tolerated well, BP stable before and after on new meds. Declined recliner, back to bed. She has RW at home.  Reviewed education, no further questions.  [phone]  Aliene Aris BS, ACSM-CEP 07/07/2024 11:35 AM

## 2024-07-08 ENCOUNTER — Encounter (HOSPITAL_COMMUNITY): Payer: Self-pay | Admitting: Cardiovascular Disease

## 2024-07-09 ENCOUNTER — Telehealth (HOSPITAL_COMMUNITY): Payer: Self-pay

## 2024-07-09 LAB — POCT ACTIVATED CLOTTING TIME
Activated Clotting Time: 250 s
Activated Clotting Time: 255 s

## 2024-07-09 NOTE — Telephone Encounter (Signed)
 Called patient to see if she was interested in cardiac Rehab program. She stated that she did not feel like she had the energy for the program right now. Advised that referral was good for year.

## 2024-07-10 ENCOUNTER — Telehealth (HOSPITAL_COMMUNITY): Payer: Self-pay

## 2024-07-10 NOTE — Telephone Encounter (Signed)
 Called to confirm/remind patient of their appointment at the [facility] on 07/10/2024.   Appointment:   [x] Confirmed  [] Left mess   [] No answer/No voice mail  [] VM Full/unable to leave message  [] Phone not in service  Patient reminded to bring all medications and/or complete list.  Confirmed patient has transportation. Gave directions, instructed to utilize valet parking.

## 2024-07-10 NOTE — Progress Notes (Incomplete)
 "    HEART & VASCULAR TRANSITION OF CARE CONSULT NOTE    Referring Physician: [facility] navigator PCP: Clarice Nottingham, MD  Cardiologist: Jerel Balding, MD  Chief Complaint: Ischemic CM  HPI: Angela Morales is a 79 y.o. 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   Patient admitted 1/26 with anterior STEMI. LHC showed LAD occluded and 80% mRCA stenosis.  Had DES to LAD 07/05/24 followed by staged DES to RCA 07/06/24.  Echo showed EF 30-35% post-MI.GDMT was started but was limited with soft BP.   Today she presents for AHF [facility] clinic visit. Overall feeling ***. Denies palpitations, CP, dizziness, edema, or PND/Orthopnea. *** SOB. Appetite ok. No fever or chills. Weight at home *** pounds. Taking all medications. Denies ETOH, tobacco or drug use.    Past Medical History:  Diagnosis Date   Abnormal uterine bleeding    on HRT   Allergy    seasonal   Arthritis    Atrial fibrillation (HCC)    Celiac disease    DDD (degenerative disc disease)    Dysrhythmia    GBS (Guillain-Barre syndrome)    Glaucoma    Microscopic colitis    Osteoarthritis of both knees    PONV (postoperative nausea and vomiting)    SVT (supraventricular tachycardia)     Current Outpatient Medications  Medication Sig Dispense Refill   amiodarone  (PACERONE ) 200 MG tablet Take 0.5 tablets (100 mg total) by mouth every Monday, Tuesday, Wednesday, Thursday, and Friday. 30 tablet 3   aspirin  EC 81 MG tablet Take 1 tablet (81 mg total) by mouth daily. Swallow whole.  STOP taking aspirin  after your last dose on 08/07/2024 30 tablet 0   Cholecalciferol 50 MCG (2000 UT) CAPS Take 2,000 Units by mouth daily.     clopidogrel  (PLAVIX ) 75 MG tablet Take 1 tablet (75 mg total) by mouth daily with breakfast. 90 tablet 1   [facility] MAGNESIUM  GLYCINATE PO Take 1 capsule by mouth at bedtime.     ELIQUIS  5 MG TABS tablet TAKE 1 TABLET BY MOUTH TWICE A DAY 180 tablet 1   ezetimibe  (ZETIA ) 10 MG tablet Take 1 tablet (10 mg total) by mouth  daily. 90 tablet 1   gabapentin  (NEURONTIN ) 300 MG capsule TAKE 1 CAPSULE BY MOUTH EVERYDAY AT BEDTIME 90 capsule 1   levothyroxine  (SYNTHROID ) 50 MCG tablet Take 50 mcg by mouth daily before breakfast.     losartan  (COZAAR ) 25 MG tablet Take 0.5 tablets (12.5 mg total) by mouth daily. 45 tablet 1   metoprolol  succinate (TOPROL  XL) 25 MG 24 hr tablet Take 0.5 tablets (12.5 mg total) by mouth at bedtime. 45 tablet 1   pantoprazole  (PROTONIX ) 40 MG tablet Take 1 tablet (40 mg total) by mouth daily. 30 tablet 1   polyethylene glycol (MIRALAX  / GLYCOLAX ) 17 g packet Take 17 g by mouth daily as needed. 14 each 0   Propylene Glycol (SYSTANE COMPLETE) 0.6 % SOLN Place 1 drop into both eyes as needed (dry eyes).     rosuvastatin  (CRESTOR ) 20 MG tablet Take 1 tablet (20 mg total) by mouth daily. 90 tablet 1   timolol  (TIMOPTIC ) 0.5 % ophthalmic solution Place 1 drop into the left eye 2 (two) times daily. 10 mL    ZIOPTAN  0.0015 % SOLN Place 1 drop into the left eye at bedtime.     No current facility-administered medications for this visit.    Allergies[1]    Social History   Socioeconomic History   Marital  status: Married    Spouse name: Angela Morales   Number of children: 3   Years of education: Not on file   Highest education level: Master's degree (e.g., MA, MS, MEng, MEd, MSW, MBA)  Occupational History   Occupation: retired  Tobacco Use   Smoking status: Never   Smokeless tobacco: Never   Tobacco comments:    Never smoked 02/10/23  Vaping Use   Vaping status: Never Used  Substance and Sexual Activity   Alcohol  use: Yes    Alcohol /week: 7.0 standard drinks of alcohol     Types: 7 Glasses of wine per week    Comment: 1 glass of wine with dinner 02/10/23   Drug use: No   Sexual activity: Yes    Partners: Male    Birth control/protection: Surgical    Comment: TVH  Other Topics Concern   Not on file  Social History Narrative   Lives with husband and son   Left handed   Caffeine: 1  cup of coffee a day   Social Drivers of Health   Tobacco Use: Low Risk (07/07/2024)   Patient History    Smoking Tobacco Use: Never    Smokeless Tobacco Use: Never    Passive Exposure: Not on file  Financial Resource Strain: Low Risk (07/06/2024)   Overall Financial Resource Strain (CARDIA)    Difficulty of Paying Living Expenses: Not hard at all  Food Insecurity: No Food Insecurity (07/07/2024)   Epic    Worried About Programme Researcher, Broadcasting/film/video in the Last Year: Never true    Ran Out of Food in the Last Year: Never true  Transportation Needs: No Transportation Needs (07/06/2024)   Epic    Lack of Transportation (Medical): No    Lack of Transportation (Non-Medical): No  Physical Activity: Not on file  Stress: Not on file  Social Connections: Moderately Integrated (07/07/2024)   Social Connection and Isolation Panel    Frequency of Communication with Friends and Family: More than three times a week    Frequency of Social Gatherings with Friends and Family: More than three times a week    Attends Religious Services: More than 4 times per year    Active Member of Clubs or Organizations: No    Attends Banker Meetings: Never    Marital Status: Married  Catering Manager Violence: Not At Risk (07/07/2024)   Epic    Fear of Current or Ex-Partner: No    Emotionally Abused: No    Physically Abused: No    Sexually Abused: No  Depression (PHQ2-9): Low Risk (08/27/2022)   Depression (PHQ2-9)    PHQ-2 Score: 0  Alcohol  Screen: Low Risk (07/06/2024)   Alcohol  Screen    Last Alcohol  Screening Score (AUDIT): 0  Housing: Low Risk (07/07/2024)   Epic    Unable to Pay for Housing in the Last Year: No    Number of Times Moved in the Last Year: 0    Homeless in the Last Year: No  Utilities: Not At Risk (07/07/2024)   Epic    Threatened with loss of utilities: No  Health Literacy: Not on file      Family History  Problem Relation Age of Onset   Osteoporosis Mother    Stroke Mother         mini   Prostate cancer Father    Heart disease Brother 50       shunt put in heart   Osteoarthritis Maternal Grandmother    Bipolar disorder  Son    Liver disease Son    Breast cancer Cousin        1st cousin   Colon cancer Neg Hx     There were no vitals filed for this visit.  PHYSICAL EXAM: General:  *** appearing.  No respiratory difficulty Neck: JVD *** cm.  Cor: Regular rate & rhythm. No murmurs. Lungs: clear Extremities: no edema  Neuro: alert & oriented x 3. Affect pleasant.   ECG: *** (Personally reviewed)     ASSESSMENT & PLAN: 1. CAD: Patient presented with anterior STEMI, LAD occluded and 80% mRCA stenosis.  Initial DES to LAD, had staged DES to RCA 1/26.   - No chest pain.  - Continue ASA 81 + Plavix  x 1 month, will stop ASA at 1 month as she will be on apixaban .  - Continue Crestor  + Zetia  10 daily.   2. Ischemic cardiomyopathy: Echo this admission with EF 30-35% post-MI.  No arrhythmias.  - NYHA *** - Volume ***, does not appear to need daily diuretic  - Continue Toprol  XL 12.5 mg daily.  - Continue  losartan  12.5 mg daily.  - Slow GDMT titration given soft BP.  - Would repeat echo in ~3 months post STEMI, if no improvement w/ GDMT titration would pursue cMRI.   3. Atrial fibrillation: Paroxysmal.  In NSR today ***.  - Continue po amiodarone .  - Continue Eliquis     Referred to HFSW (PCP, Medications, Transportation, ETOH Abuse, Drug Abuse, Insurance, Financial ): Yes or No Refer to Pharmacy: Yes or No Refer to Home Health: Yes on No Refer to Advanced [facility] Failure Clinic: Yes or no  Refer to General Cardiology: Yes or No  Follow up      [1]  Allergies Allergen Reactions   Gluten Meal Other (See Comments)    celiac disease   Lactose Intolerance (Gi) Other (See Comments)    Gas and bloating   Cefdinir Rash   Robaxin  [Methocarbamol ] Other (See Comments)    Dizziness    "

## 2024-07-11 ENCOUNTER — Inpatient Hospital Stay (HOSPITAL_COMMUNITY): Admission: RE | Admit: 2024-07-11 | Discharge: 2024-07-11 | Attending: Internal Medicine

## 2024-07-11 ENCOUNTER — Encounter (HOSPITAL_COMMUNITY): Payer: Self-pay

## 2024-07-11 VITALS — BP 138/82 | HR 86 | Ht 63.0 in | Wt 144.0 lb

## 2024-07-11 DIAGNOSIS — I480 Paroxysmal atrial fibrillation: Secondary | ICD-10-CM | POA: Diagnosis not present

## 2024-07-11 DIAGNOSIS — I5020 Unspecified systolic (congestive) heart failure: Secondary | ICD-10-CM

## 2024-07-11 DIAGNOSIS — I255 Ischemic cardiomyopathy: Secondary | ICD-10-CM

## 2024-07-11 NOTE — Patient Instructions (Signed)
 Medication Changes:  TAKE LOSARTAN  AT BEDTIME   Follow-Up in: WITH GENERAL CARDIOLOGY   At the [facility], you and your health needs are our priority. We have a designated team specialized in the treatment of Heart Failure. This Care Team includes your primary Heart Failure Specialized Cardiologist (physician), Advanced Practice Providers (APPs- Physician Assistants and Nurse Practitioners), and Pharmacist who all work together to provide you with the care you need, when you need it.   You may see any of the following providers on your designated Care Team at your next follow up:  Dr. Toribio Fuel Dr. Ezra Shuck Dr. Odis Brownie Greig Mosses, NP Caffie Shed, [place] [facility] [place], [place] Beckey Coe, NP Jordan Lee, NP Tinnie Redman, PharmD   Please be sure to bring in all your medications bottles to every appointment.   Need to Contact Us :  If you have any questions or concerns before your next appointment please send us  a message through [place] or call our office at [phone].    TO LEAVE A MESSAGE FOR THE NURSE SELECT OPTION 2, PLEASE LEAVE A MESSAGE INCLUDING: YOUR NAME DATE OF BIRTH CALL BACK NUMBER REASON FOR CALL**this is important as we prioritize the call backs  YOU WILL RECEIVE A CALL BACK THE SAME DAY AS LONG AS YOU CALL BEFORE 4:00 PM

## 2024-07-11 NOTE — Progress Notes (Signed)
 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 She has been on amio for the last few years.   Patient admitted 1/26 with anterior STEMI. LHC showed LAD occluded and 80% mRCA stenosis.  Had DES to LAD 07/05/24 followed by staged DES to RCA 07/06/24.  Echo showed EF 30-35% post-MI. GDMT was started but was limited by soft BP.   Today she presents for post hospital follow up. Not feeling to bad. Having some dizziness.  Denies palpitations, CP, edema, or PND/Orthopnea. Denies  SOB. No bleeding issues. Appetite ok. No fever or chills.Taking all medications. Denies ETOH, tobacco or drug use.    Past Medical History:  Diagnosis Date   Abnormal uterine bleeding    on HRT   Allergy    seasonal   Arthritis    Atrial fibrillation (HCC)    Celiac disease    DDD (degenerative disc disease)    Dysrhythmia    GBS (Guillain-Barre syndrome)    Glaucoma    Microscopic colitis    Osteoarthritis of both knees    PONV (postoperative nausea and vomiting)    SVT (supraventricular tachycardia)     Current Outpatient Medications  Medication Sig Dispense Refill   amiodarone  (PACERONE ) 200 MG tablet Take 100 mg by mouth daily.     aspirin  EC 81 MG tablet Take 1 tablet (81 mg total) by mouth daily. Swallow whole.  STOP taking aspirin  after your last dose on 08/07/2024 30 tablet 0   Cholecalciferol 50 MCG (2000 UT) CAPS Take 2,000 Units by mouth daily.     clopidogrel  (PLAVIX ) 75 MG tablet Take 1 tablet (75 mg total) by mouth daily with breakfast. 90 tablet 1   [facility] MAGNESIUM  GLYCINATE PO Take 1 capsule by mouth at bedtime.     ELIQUIS  5 MG TABS tablet TAKE 1 TABLET BY MOUTH TWICE A DAY 180 tablet 1   gabapentin  (NEURONTIN ) 300 MG capsule TAKE 1 CAPSULE BY MOUTH EVERYDAY AT BEDTIME 90 capsule 1   levothyroxine  (SYNTHROID ) 50 MCG  tablet Take 50 mcg by mouth daily before breakfast.     losartan  (COZAAR ) 25 MG tablet Take 0.5 tablets (12.5 mg total) by mouth daily. 45 tablet 1   metoprolol  succinate (TOPROL -XL) 25 MG 24 hr tablet Take 25 mg by mouth daily.     pantoprazole  (PROTONIX ) 40 MG tablet Take 1 tablet (40 mg total) by mouth daily. 30 tablet 1   polyethylene glycol (MIRALAX  / GLYCOLAX ) 17 g packet Take 17 g by mouth daily as needed. 14 each 0   Propylene Glycol (SYSTANE COMPLETE) 0.6 % SOLN Place 1 drop into both eyes as needed (dry eyes).     rosuvastatin  (CRESTOR ) 20 MG tablet Take 1 tablet (20 mg total) by mouth daily. 90 tablet 1   timolol  (TIMOPTIC ) 0.5 % ophthalmic solution Place 1 drop into the left eye 2 (two) times daily. 10 mL    ZIOPTAN  0.0015 % SOLN Place 1 drop into the left eye at bedtime.     ezetimibe  (ZETIA ) 10 MG tablet Take 1 tablet (10 mg total) by mouth daily. (Patient not taking: Reported on 07/11/2024) 90 tablet 1   No current facility-administered medications for this encounter.    Allergies[1]    Social History   Socioeconomic History   Marital status: Married  Spouse name: [name]   Number of children: 3   Years of education: Not on file   Highest education level: Master's degree (e.g., MA, MS, MEng, MEd, MSW, MBA)  Occupational History   Occupation: retired  Tobacco Use   Smoking status: Never   Smokeless tobacco: Never   Tobacco comments:    Never smoked 02/10/23  Vaping Use   Vaping status: Never Used  Substance and Sexual Activity   Alcohol  use: Yes    Alcohol /week: 7.0 standard drinks of alcohol     Types: 7 Glasses of wine per week    Comment: 1 glass of wine with dinner 02/10/23   Drug use: No   Sexual activity: Yes    Partners: Male    Birth control/protection: Surgical    Comment: TVH  Other Topics Concern   Not on file  Social History Narrative   Lives with husband and son   Left handed   Caffeine: 1 cup of coffee a day   Social Drivers of Health    Tobacco Use: Low Risk (07/11/2024)   Patient History    Smoking Tobacco Use: Never    Smokeless Tobacco Use: Never    Passive Exposure: Not on file  Financial Resource Strain: Low Risk (07/06/2024)   Overall Financial Resource Strain (CARDIA)    Difficulty of Paying Living Expenses: Not hard at all  Food Insecurity: No Food Insecurity (07/07/2024)   Epic    Worried About Programme Researcher, Broadcasting/film/video in the Last Year: Never true    Ran Out of Food in the Last Year: Never true  Transportation Needs: No Transportation Needs (07/06/2024)   Epic    Lack of Transportation (Medical): No    Lack of Transportation (Non-Medical): No  Physical Activity: Not on file  Stress: Not on file  Social Connections: Moderately Integrated (07/07/2024)   Social Connection and Isolation Panel    Frequency of Communication with Friends and Family: More than three times a week    Frequency of Social Gatherings with Friends and Family: More than three times a week    Attends Religious Services: More than 4 times per year    Active Member of Clubs or Organizations: No    Attends Banker Meetings: Never    Marital Status: Married  Catering Manager Violence: Not At Risk (07/07/2024)   Epic    Fear of Current or Ex-Partner: No    Emotionally Abused: No    Physically Abused: No    Sexually Abused: No  Depression (PHQ2-9): Low Risk (08/27/2022)   Depression (PHQ2-9)    PHQ-2 Score: 0  Alcohol  Screen: Low Risk (07/06/2024)   Alcohol  Screen    Last Alcohol  Screening Score (AUDIT): 0  Housing: Low Risk (07/07/2024)   Epic    Unable to Pay for Housing in the Last Year: No    Number of Times Moved in the Last Year: 0    Homeless in the Last Year: No  Utilities: Not At Risk (07/07/2024)   Epic    Threatened with loss of utilities: No  Health Literacy: Not on file      Family History  Problem Relation Age of Onset   Osteoporosis Mother    Stroke Mother        mini   Prostate cancer Father    Heart disease  Brother 8       shunt put in heart   Osteoarthritis Maternal Grandmother    Bipolar disorder Son    Liver  disease Son    Breast cancer Cousin        1st cousin   Colon cancer Neg Hx     Vitals:   07/11/24 1452  BP: 138/82  Pulse: 86  SpO2: 96%  Weight: 65.3 kg (144 lb)  Height: 5' 3 (1.6 m)    PHYSICAL EXAM: General:  Well appearing.  No respiratory difficulty Neck: JVD 5-6cm.  Cor: Regular rate & rhythm. No murmurs. Lungs: clear Extremities: no edema  Neuro: alert & oriented x 3. Affect pleasant.   ECG: SR 1st degree heart block.    ASSESSMENT & PLAN: 1. CAD: S/P anterior STEMI, LAD occluded and 80% mRCA stenosis.  DES to LAD followed by staged DES to RCA 1/8//26.  She has follow up with [facility].  - No chest pain.  - Continue ASA 81 + Plavix  x 1 month, will stop ASA (08/06/24)  as she will be on apixaban .  - Continue Crestor  + Zetia  10 daily.  Referred to cardiac rehab.   2.Chronic HFrEF, ICM  Echo  EF 30-35% post-MI.   - NYHA I.  - Volume appears stable, does not appear to need daily diuretic  - Continue Toprol  XL 12.5 mg daily.  - Switch losartan  12.5 mg daily at bedtime to see if that helps with dizziness.   - Slow GDMT titration given soft BP.  - Would repeat echo in couple of months post STEMI, if no improvement w/ GDMT titration would pursue cMRI.   3. Atrial fibrillation: Paroxysmal.   - Continue po amiodarone .  Patient is on amiodarone .  - Plan to follow amiodarone  screening per guidelines  -Check TSH, Free T3 Free T4 3 months - Will need yearly CXR or PFTs, TSH Free T3 Free T4, LFTS, and eye exams.  - Discussed with patient. - Continue Eliquis   Follow up with Dr Francyne and HF as needed per Dr Rolan.   Leia Coletti NP-C  3:08 PM      [1]  Allergies Allergen Reactions   Gluten Meal Other (See Comments)    celiac disease   Lactose Intolerance (Gi) Other (See Comments)    Gas and bloating   Cefdinir Rash   Robaxin  [Methocarbamol ] Other  (See Comments)    Dizziness    "

## 2024-07-23 ENCOUNTER — Ambulatory Visit: Admitting: Cardiology

## 2024-08-01 ENCOUNTER — Encounter: Payer: Self-pay | Admitting: Cardiology

## 2024-08-01 ENCOUNTER — Ambulatory Visit: Admitting: Cardiology

## 2024-08-01 VITALS — BP 116/74 | HR 78 | Ht 63.0 in | Wt 142.0 lb

## 2024-08-01 DIAGNOSIS — I2510 Atherosclerotic heart disease of native coronary artery without angina pectoris: Secondary | ICD-10-CM

## 2024-08-01 DIAGNOSIS — Z79899 Other long term (current) drug therapy: Secondary | ICD-10-CM

## 2024-08-01 DIAGNOSIS — I5020 Unspecified systolic (congestive) heart failure: Secondary | ICD-10-CM | POA: Diagnosis not present

## 2024-08-01 DIAGNOSIS — I480 Paroxysmal atrial fibrillation: Secondary | ICD-10-CM | POA: Diagnosis not present

## 2024-08-01 DIAGNOSIS — I255 Ischemic cardiomyopathy: Secondary | ICD-10-CM

## 2024-08-01 NOTE — Patient Instructions (Signed)
 Medication Instructions:  None  *If you need a refill on your cardiac medications before your next appointment, please call your pharmacy*  Lab Work: Today: BMET, CBC If you have labs (blood work) drawn today and your tests are completely normal, you will receive your results only by: MyChart Message (if you have MyChart) OR A paper copy in the mail If you have any lab test that is abnormal or we need to change your treatment, we will call you to review the results.  Testing/Procedures: Echocardiogram in 2 months. Your physician has requested that you have an echocardiogram. Echocardiography is a painless test that uses sound waves to create images of your heart. It provides your doctor with information about the size and shape of your heart and how well your hearts chambers and valves are working. This procedure takes approximately one hour. There are no restrictions for this procedure. Please do NOT wear cologne, perfume, aftershave, or lotions (deodorant is allowed). Please arrive 15 minutes prior to your appointment time.  Please note: We ask at that you not bring children with you during ultrasound (echo/ vascular) testing. Due to room size and safety concerns, children are not allowed in the ultrasound rooms during exams. Our front office staff cannot provide observation of children in our lobby area while testing is being conducted. An adult accompanying a patient to their appointment will only be allowed in the ultrasound room at the discretion of the ultrasound technician under special circumstances. We apologize for any inconvenience.   Follow-Up: At [facility], you and your health needs are our priority.  As part of our continuing mission to provide you with exceptional heart care, our providers are all part of one team.  This team includes your primary Cardiologist (physician) and Advanced Practice Providers or APPs (Physician Assistants and Nurse Practitioners) who all  work together to provide you with the care you need, when you need it.  Your next appointment:   3 month(s)  Provider:   Jerel Balding, MD    Other Instructions None

## 2024-08-02 ENCOUNTER — Ambulatory Visit: Payer: Self-pay | Admitting: Cardiology

## 2024-08-02 LAB — CBC
Hematocrit: 43.8 % (ref 34.0–46.6)
Hemoglobin: 13.9 g/dL (ref 11.1–15.9)
MCH: 33.2 pg — ABNORMAL HIGH (ref 26.6–33.0)
MCHC: 31.7 g/dL (ref 31.5–35.7)
MCV: 105 fL — ABNORMAL HIGH (ref 79–97)
Platelets: 174 10*3/uL (ref 150–450)
RBC: 4.19 x10E6/uL (ref 3.77–5.28)
RDW: 12.6 % (ref 11.7–15.4)
WBC: 8.1 10*3/uL (ref 3.4–10.8)

## 2024-08-02 LAB — BASIC METABOLIC PANEL WITH GFR
BUN/Creatinine Ratio: 12 (ref 12–28)
BUN: 9 mg/dL (ref 8–27)
CO2: 24 mmol/L (ref 20–29)
Calcium: 9.9 mg/dL (ref 8.7–10.3)
Chloride: 96 mmol/L (ref 96–106)
Creatinine, Ser: 0.73 mg/dL (ref 0.57–1.00)
Glucose: 86 mg/dL (ref 70–99)
Potassium: 5.1 mmol/L (ref 3.5–5.2)
Sodium: 135 mmol/L (ref 134–144)
eGFR: 84 mL/min/{1.73_m2}

## 2024-09-03 ENCOUNTER — Ambulatory Visit: Admitting: Cardiology

## 2024-10-12 ENCOUNTER — Ambulatory Visit (HOSPITAL_COMMUNITY)

## 2024-12-11 ENCOUNTER — Ambulatory Visit: Admitting: Cardiovascular Disease
